# Patient Record
Sex: Male | Born: 1965 | Race: White | Hispanic: No | Marital: Married | Smoking: Former smoker
Health system: Southern US, Community
[De-identification: ages and names within clinical notes are randomized; demographics above are authoritative.]

## PROBLEM LIST (undated history)

## (undated) DIAGNOSIS — T7840XA Allergy, unspecified, initial encounter: Secondary | ICD-10-CM

## (undated) DIAGNOSIS — I472 Ventricular tachycardia, unspecified: Secondary | ICD-10-CM

## (undated) DIAGNOSIS — I219 Acute myocardial infarction, unspecified: Secondary | ICD-10-CM

## (undated) DIAGNOSIS — L309 Dermatitis, unspecified: Secondary | ICD-10-CM

## (undated) DIAGNOSIS — Z87442 Personal history of urinary calculi: Secondary | ICD-10-CM

## (undated) DIAGNOSIS — F1910 Other psychoactive substance abuse, uncomplicated: Secondary | ICD-10-CM

## (undated) DIAGNOSIS — Z95810 Presence of automatic (implantable) cardiac defibrillator: Secondary | ICD-10-CM

## (undated) DIAGNOSIS — I2699 Other pulmonary embolism without acute cor pulmonale: Secondary | ICD-10-CM

## (undated) DIAGNOSIS — G459 Transient cerebral ischemic attack, unspecified: Secondary | ICD-10-CM

## (undated) DIAGNOSIS — I509 Heart failure, unspecified: Secondary | ICD-10-CM

## (undated) DIAGNOSIS — I4891 Unspecified atrial fibrillation: Secondary | ICD-10-CM

## (undated) DIAGNOSIS — E785 Hyperlipidemia, unspecified: Secondary | ICD-10-CM

## (undated) DIAGNOSIS — N200 Calculus of kidney: Secondary | ICD-10-CM

## (undated) DIAGNOSIS — K219 Gastro-esophageal reflux disease without esophagitis: Secondary | ICD-10-CM

## (undated) DIAGNOSIS — J189 Pneumonia, unspecified organism: Secondary | ICD-10-CM

## (undated) DIAGNOSIS — E119 Type 2 diabetes mellitus without complications: Secondary | ICD-10-CM

## (undated) DIAGNOSIS — I10 Essential (primary) hypertension: Secondary | ICD-10-CM

## (undated) DIAGNOSIS — M1990 Unspecified osteoarthritis, unspecified site: Secondary | ICD-10-CM

## (undated) HISTORY — DX: Heart failure, unspecified: I50.9

## (undated) HISTORY — PX: SHOULDER ARTHROSCOPY: SHX128

## (undated) HISTORY — DX: Other pulmonary embolism without acute cor pulmonale: I26.99

## (undated) HISTORY — DX: Calculus of kidney: N20.0

## (undated) HISTORY — DX: Hyperlipidemia, unspecified: E78.5

## (undated) HISTORY — DX: Type 2 diabetes mellitus without complications: E11.9

## (undated) HISTORY — PX: VASECTOMY: SHX75

## (undated) HISTORY — PX: OTHER SURGICAL HISTORY: SHX169

## (undated) HISTORY — DX: Unspecified osteoarthritis, unspecified site: M19.90

## (undated) HISTORY — DX: Other psychoactive substance abuse, uncomplicated: F19.10

## (undated) HISTORY — DX: Dermatitis, unspecified: L30.9

## (undated) HISTORY — DX: Allergy, unspecified, initial encounter: T78.40XA

## (undated) HISTORY — PX: CERVICAL FUSION: SHX112

## (undated) HISTORY — PX: WISDOM TOOTH EXTRACTION: SHX21

---

## 2007-04-25 ENCOUNTER — Encounter: Admission: RE | Admit: 2007-04-25 | Discharge: 2007-04-25 | Payer: Self-pay | Admitting: Family Medicine

## 2007-04-25 IMAGING — CR DG CHEST 2V
2 series · 2 of 2 positions shown · non-contrast
Comparison: none

CLINICAL DATA: Short of breath, fever.  Smoking history.
 TWO VIEW CHEST:
 Two views of the chest show no pneumonia.  There are prominent perihilar markings with peribronchial thickening consistent with bronchitis.  The heart is mildly enlarged.  No bony abnormality is seen.

[w chest pa]
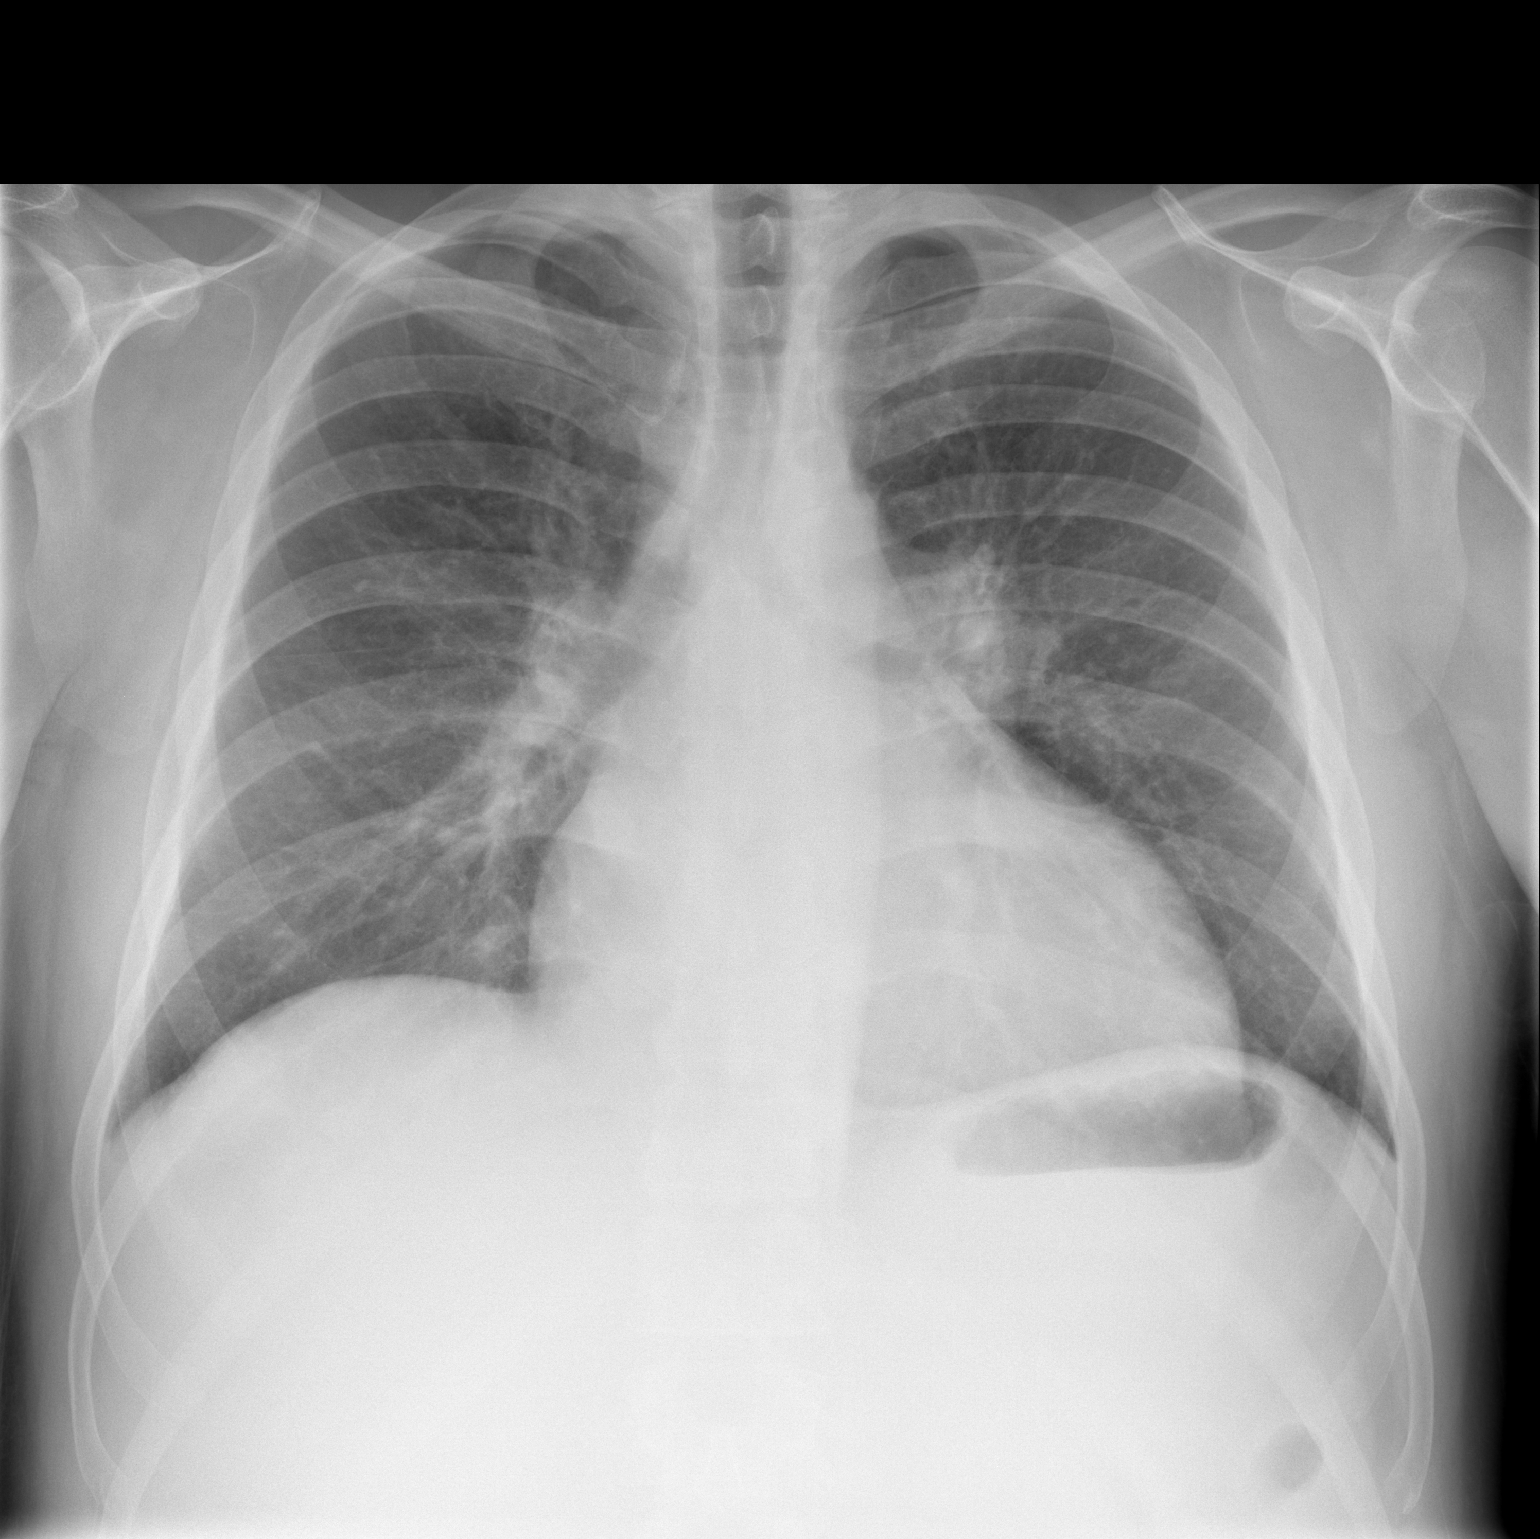

[w chest lat]
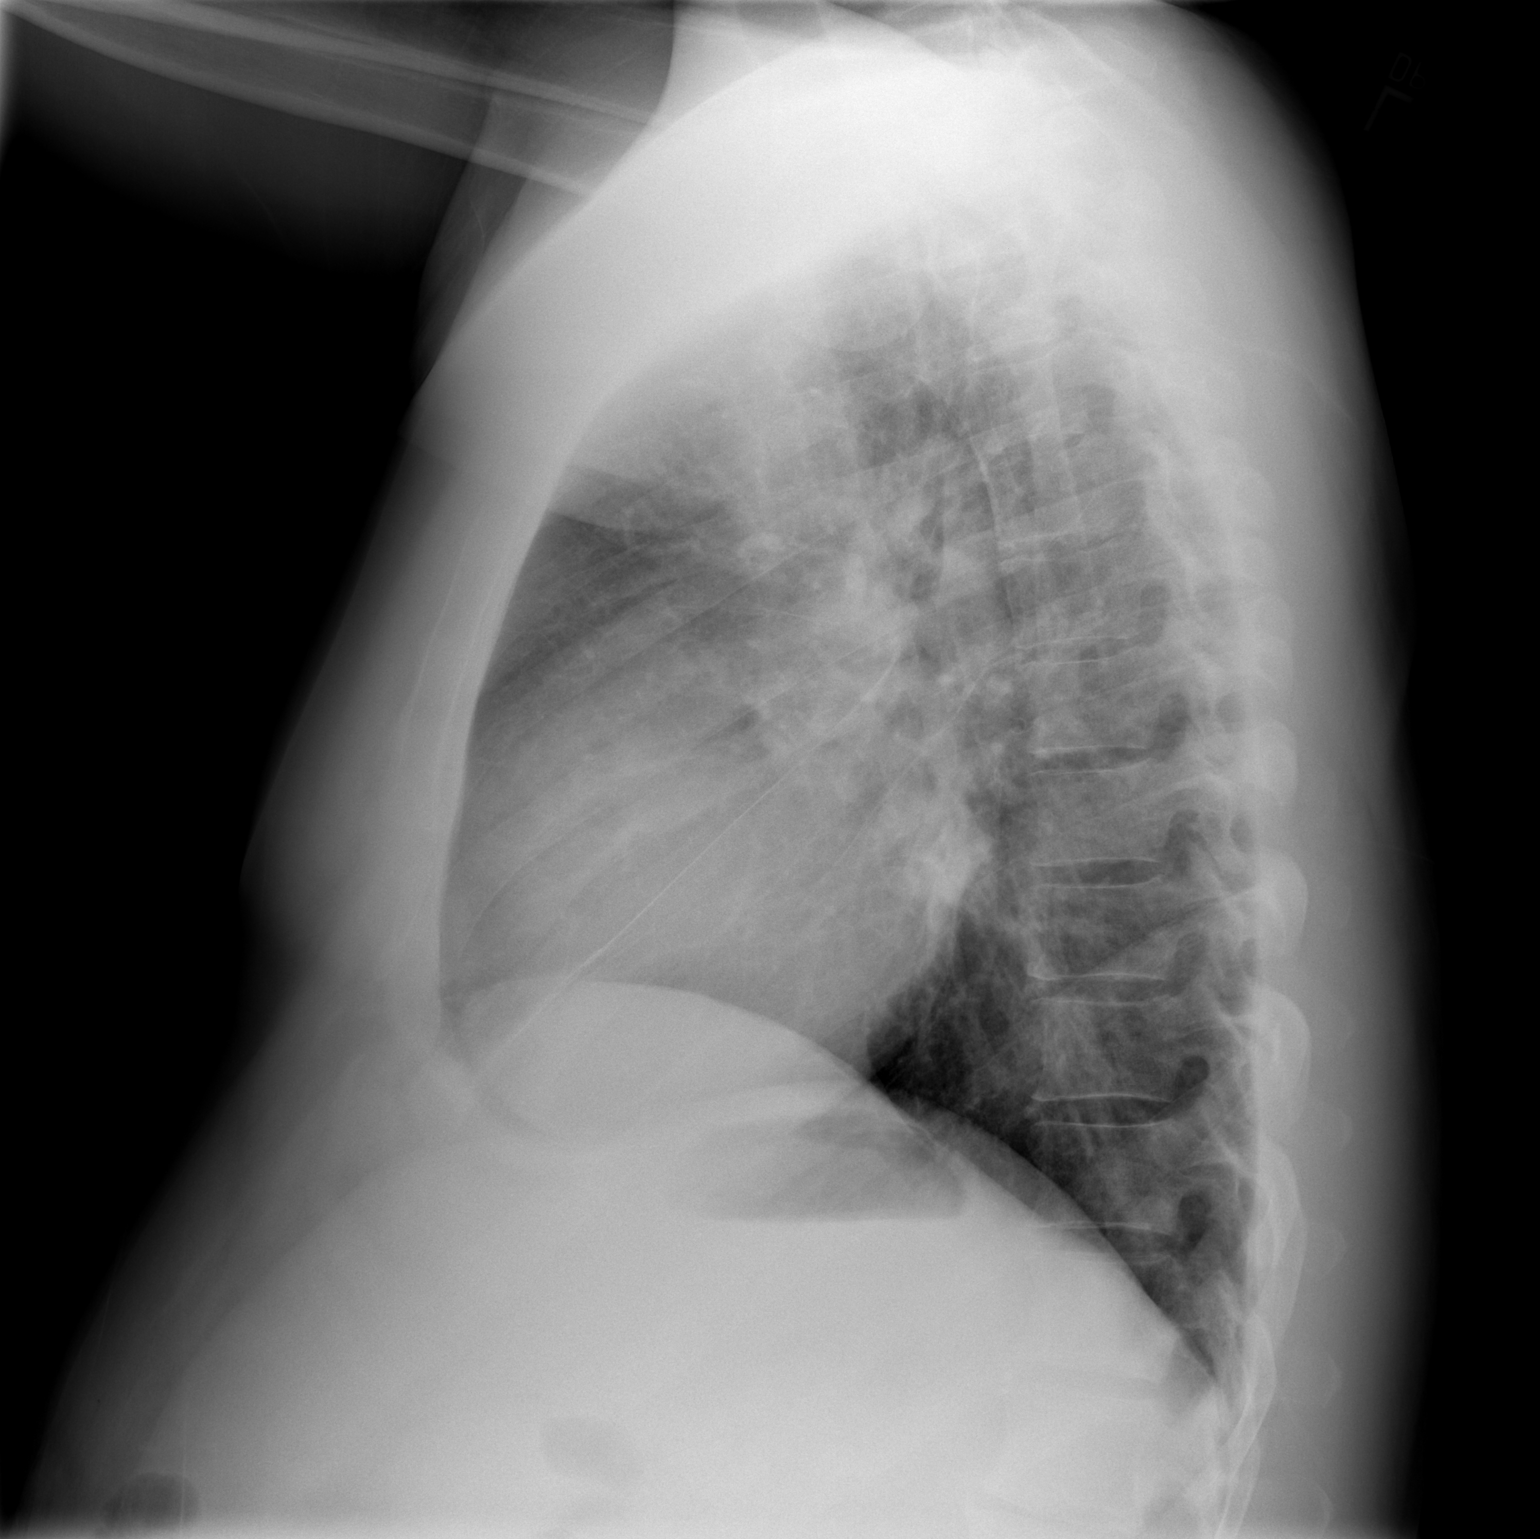

[2 of 2 positions shown; findings below may reference images not displayed]

IMPRESSION: Peribronchial thickening consistent with bronchitis.  No pneumonia.

## 2007-04-26 ENCOUNTER — Ambulatory Visit: Payer: Self-pay | Admitting: Cardiology

## 2007-04-26 ENCOUNTER — Ambulatory Visit: Payer: Self-pay | Admitting: Internal Medicine

## 2007-04-26 ENCOUNTER — Inpatient Hospital Stay (HOSPITAL_COMMUNITY): Admission: EM | Admit: 2007-04-26 | Discharge: 2007-05-06 | Payer: Self-pay | Admitting: Emergency Medicine

## 2007-04-26 IMAGING — CR DG CHEST 2V
2 series · 2 of 2 positions shown · non-contrast
Comparison: [DATE].

CLINICAL DATA: Chest pain.  Short of breath.
 CHEST ? 2 VIEW:

[w chest pa]
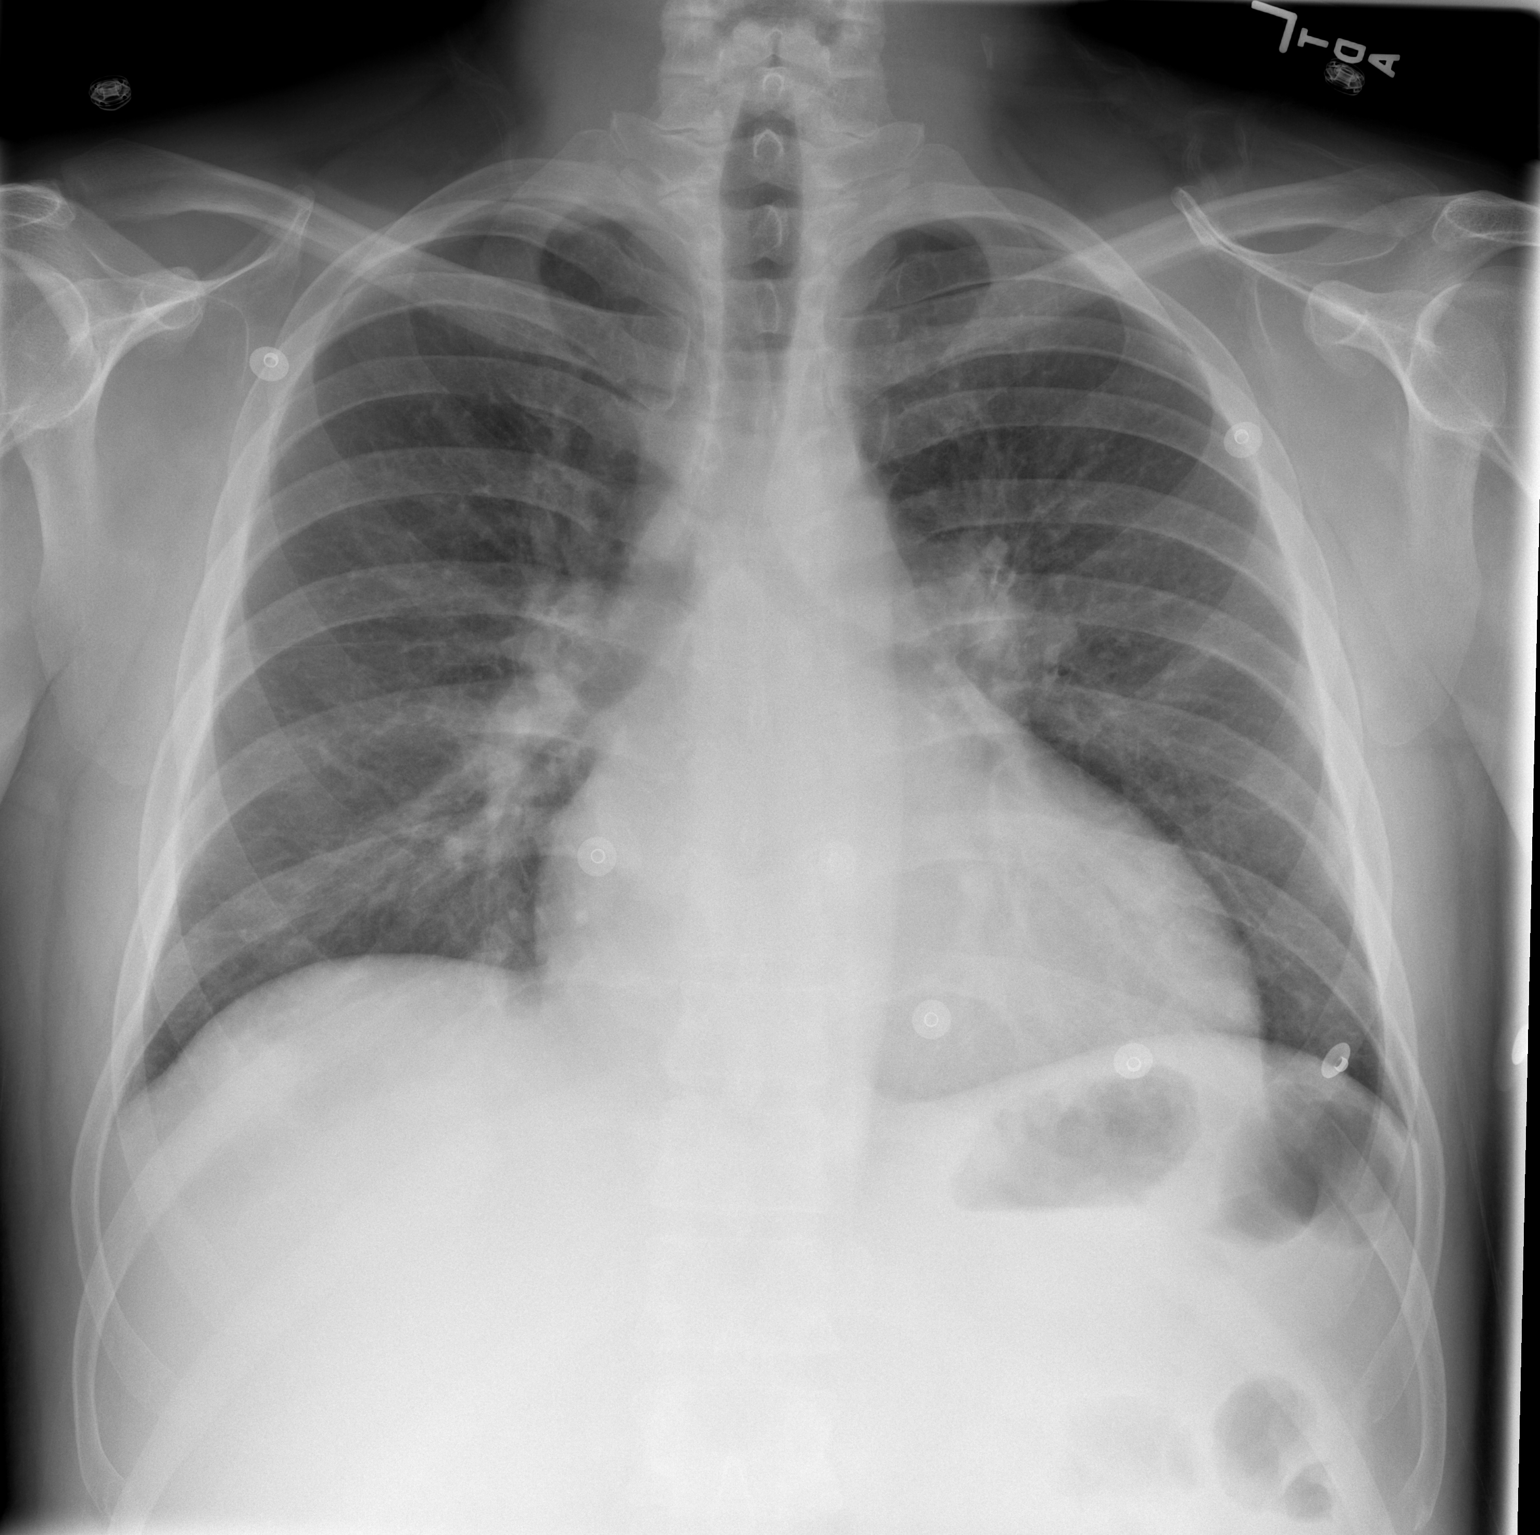

[w chest lat]
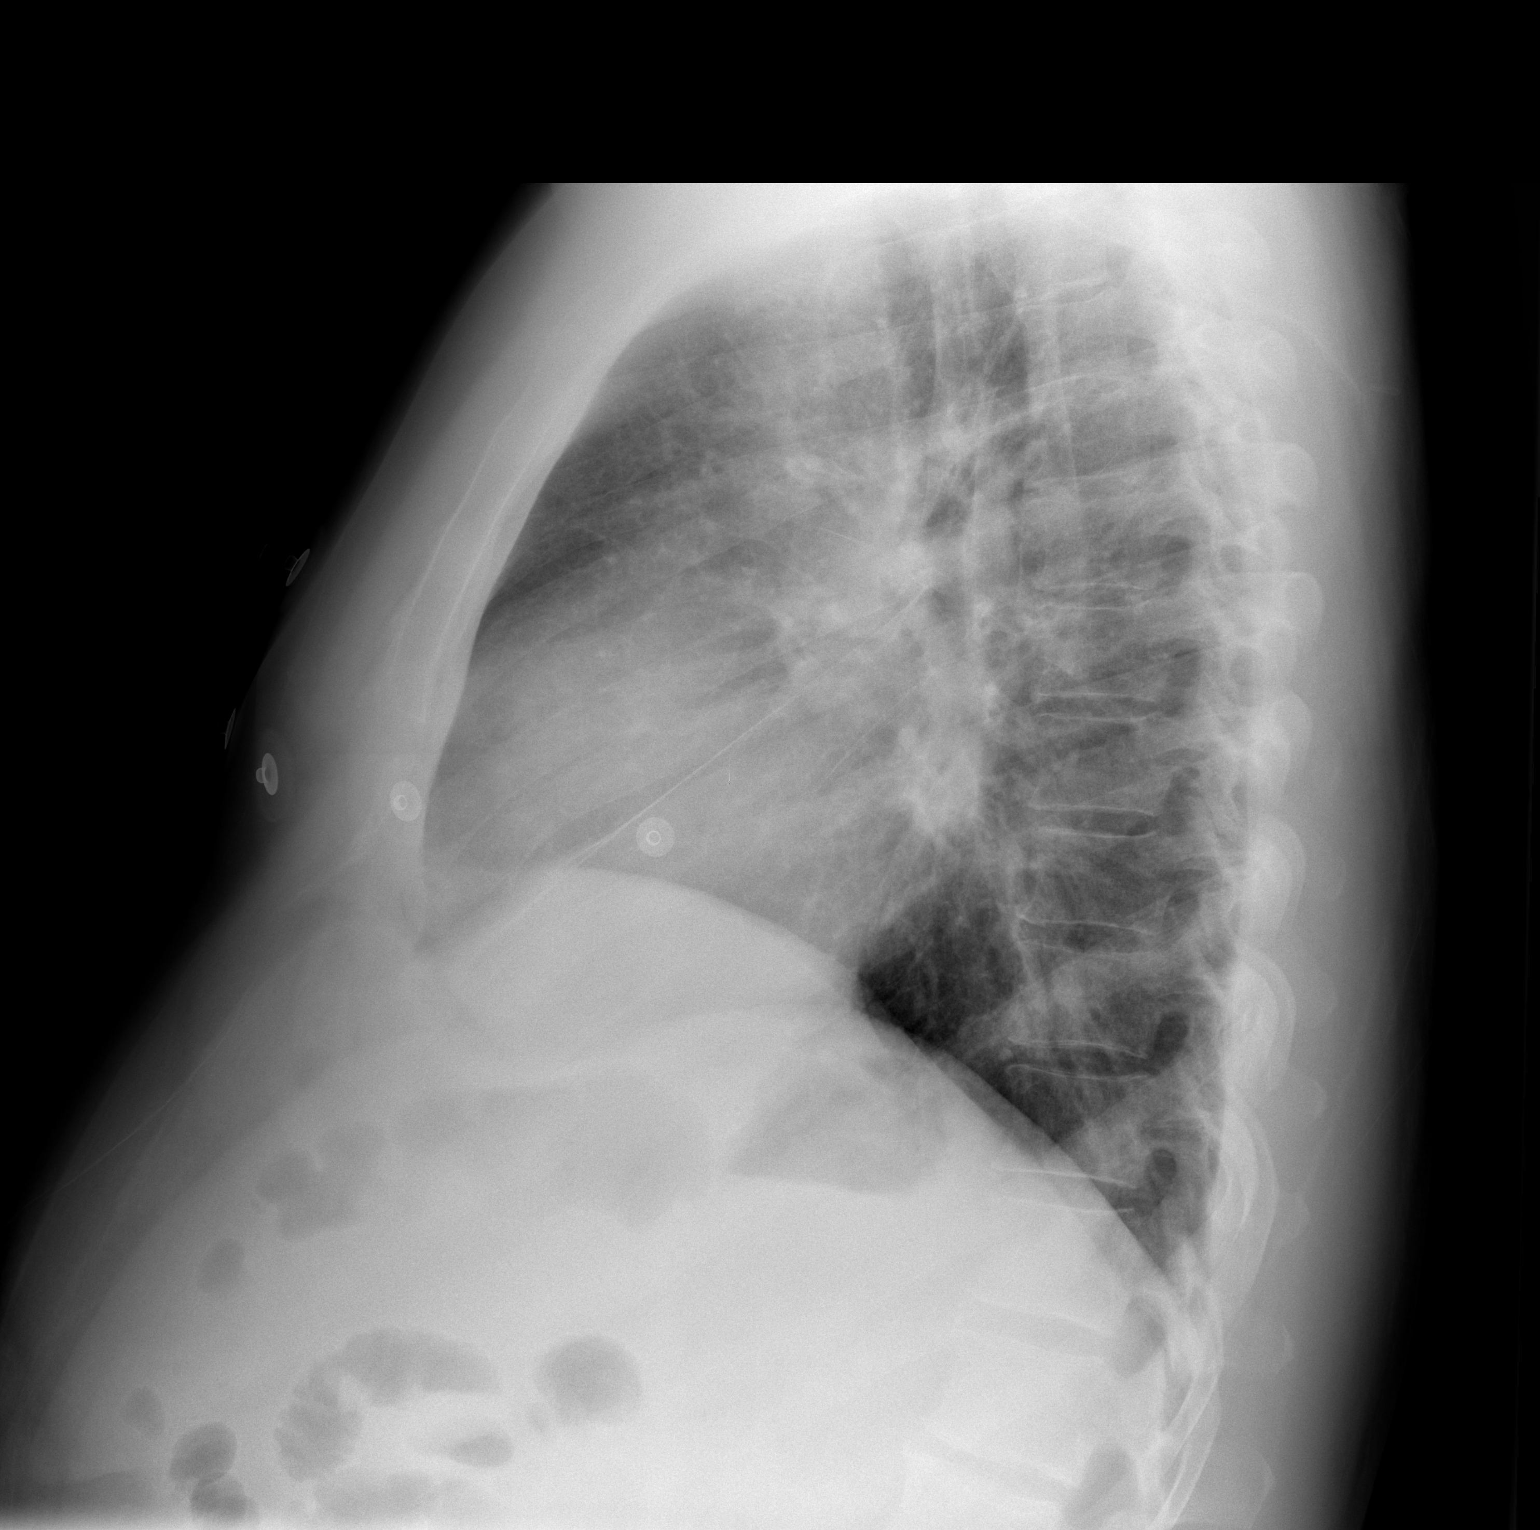

[2 of 2 positions shown; findings below may reference images not displayed]

FINDINGS: Two views of the chest show changes of bronchitis. However, on the frontal view, there is vague opacity at the right lung base, and patchy pneumonia cannot be excluded.  There is cardiomegaly present.  No effusion is seen.  No bony abnormality is noted.
IMPRESSION: 1. Changes of bronchitis.  Cannot exclude patchy pneumonia at the right lung base seen only on the frontal view.  
 2. Stable cardiomegaly.

## 2007-04-27 ENCOUNTER — Encounter (INDEPENDENT_AMBULATORY_CARE_PROVIDER_SITE_OTHER): Payer: Self-pay | Admitting: Interventional Radiology

## 2007-04-27 IMAGING — US US PARACENTESIS
1 series · 5 of 5 positions shown · non-contrast
Comparison: none

CLINICAL DATA: Abdominal distention. Ascites.

[Series 1: unknown · 0.38mm/px · 5 of 5 slices shown]
[im 1/5]
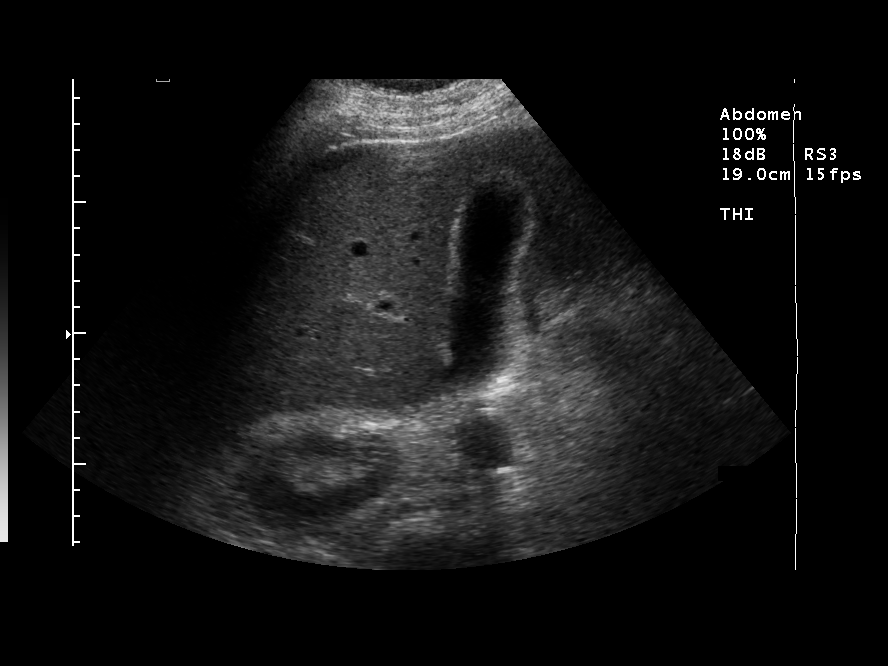
[im 2/5]
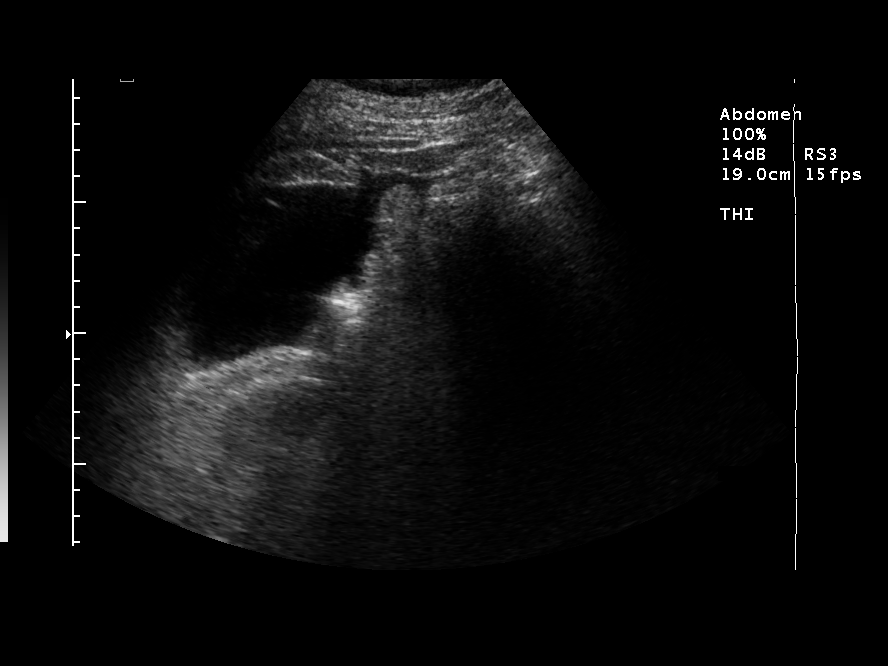
[im 3/5]
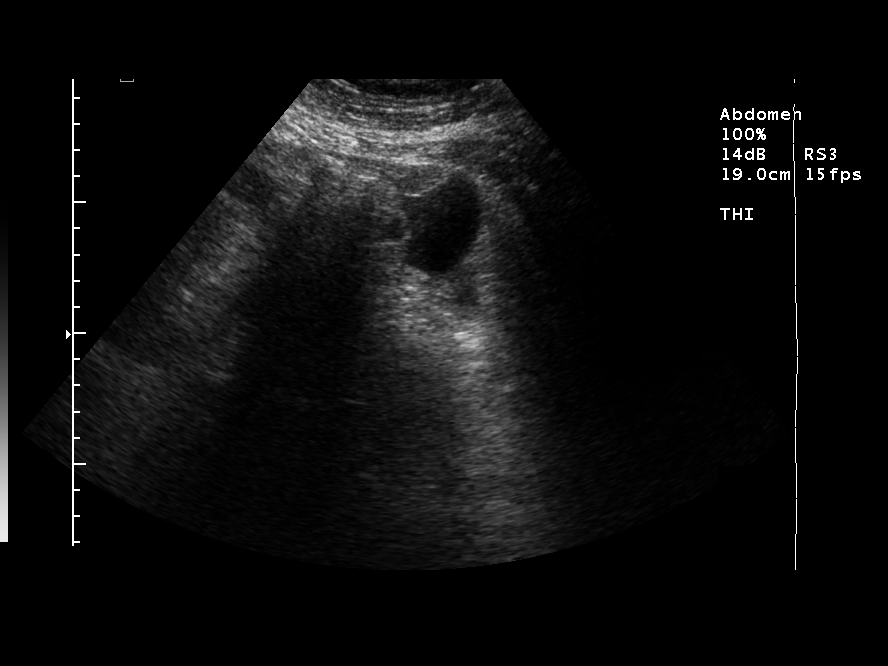
[im 4/5]
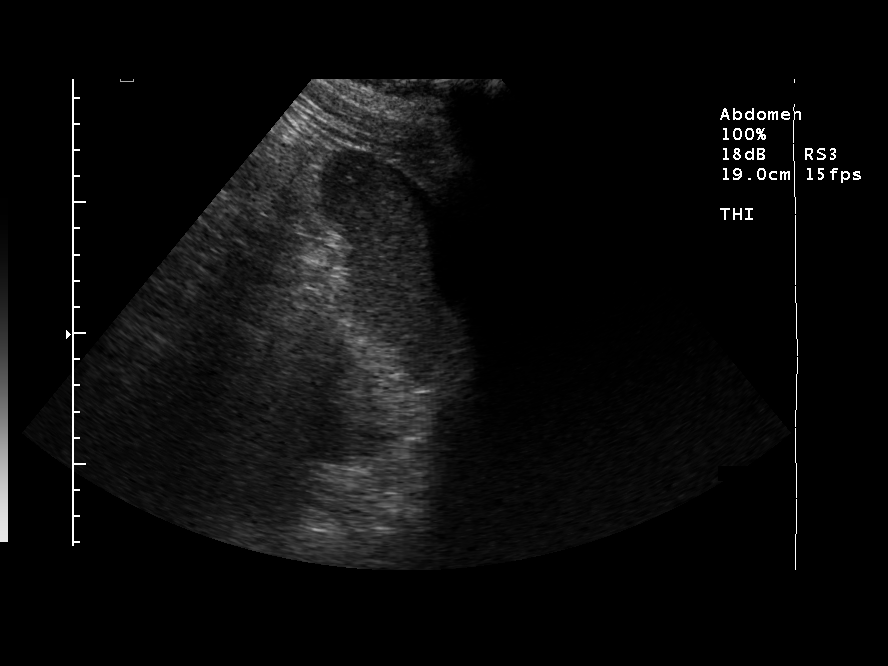
[im 5/5]
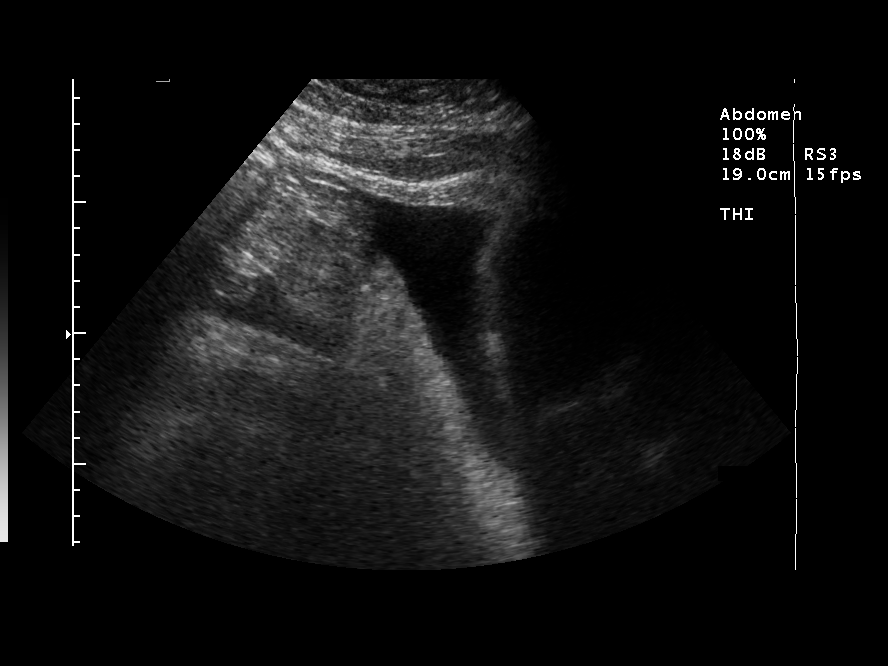

[5 of 5 positions shown; findings below may reference images not displayed]

ULTRASOUND GUIDED PARACENTESIS:

Survey ultrasound of the abdomen was performed and an appropriate skin entry
site in the right  lower abdomen was selected. Skin site was marked, prepped
with Betadine, and draped in usual sterile fashion, and infiltrated locally with
1% lidocaine. A 5 French multisidehole ARBEN KLEO needle was advanced into the
peritoneal space until fluid could be aspirated. The sheath was advanced and the
needle removed. 600 ml of clear bright yellow ascites were aspirated. Samples
were sent for the requested laboratory studies. No immediate complication.
IMPRESSION: 1. Technically successful ultrasound guided paracentesis, removing 600 mL 
ascites

## 2007-04-27 IMAGING — CR DG ABDOMEN 1V
1 series · 1 of 1 positions shown · non-contrast
Comparison: none

CLINICAL DATA: Nephrolithiasis

Abdomen one view:
Comparison CT from [DATE]. 7 mm calculus projects at tip of the right L2
transverse process, stable position compared to previous exam. Normal bowel gas
pattern. Visualized bones unremarkable.

[t abdomen supine]
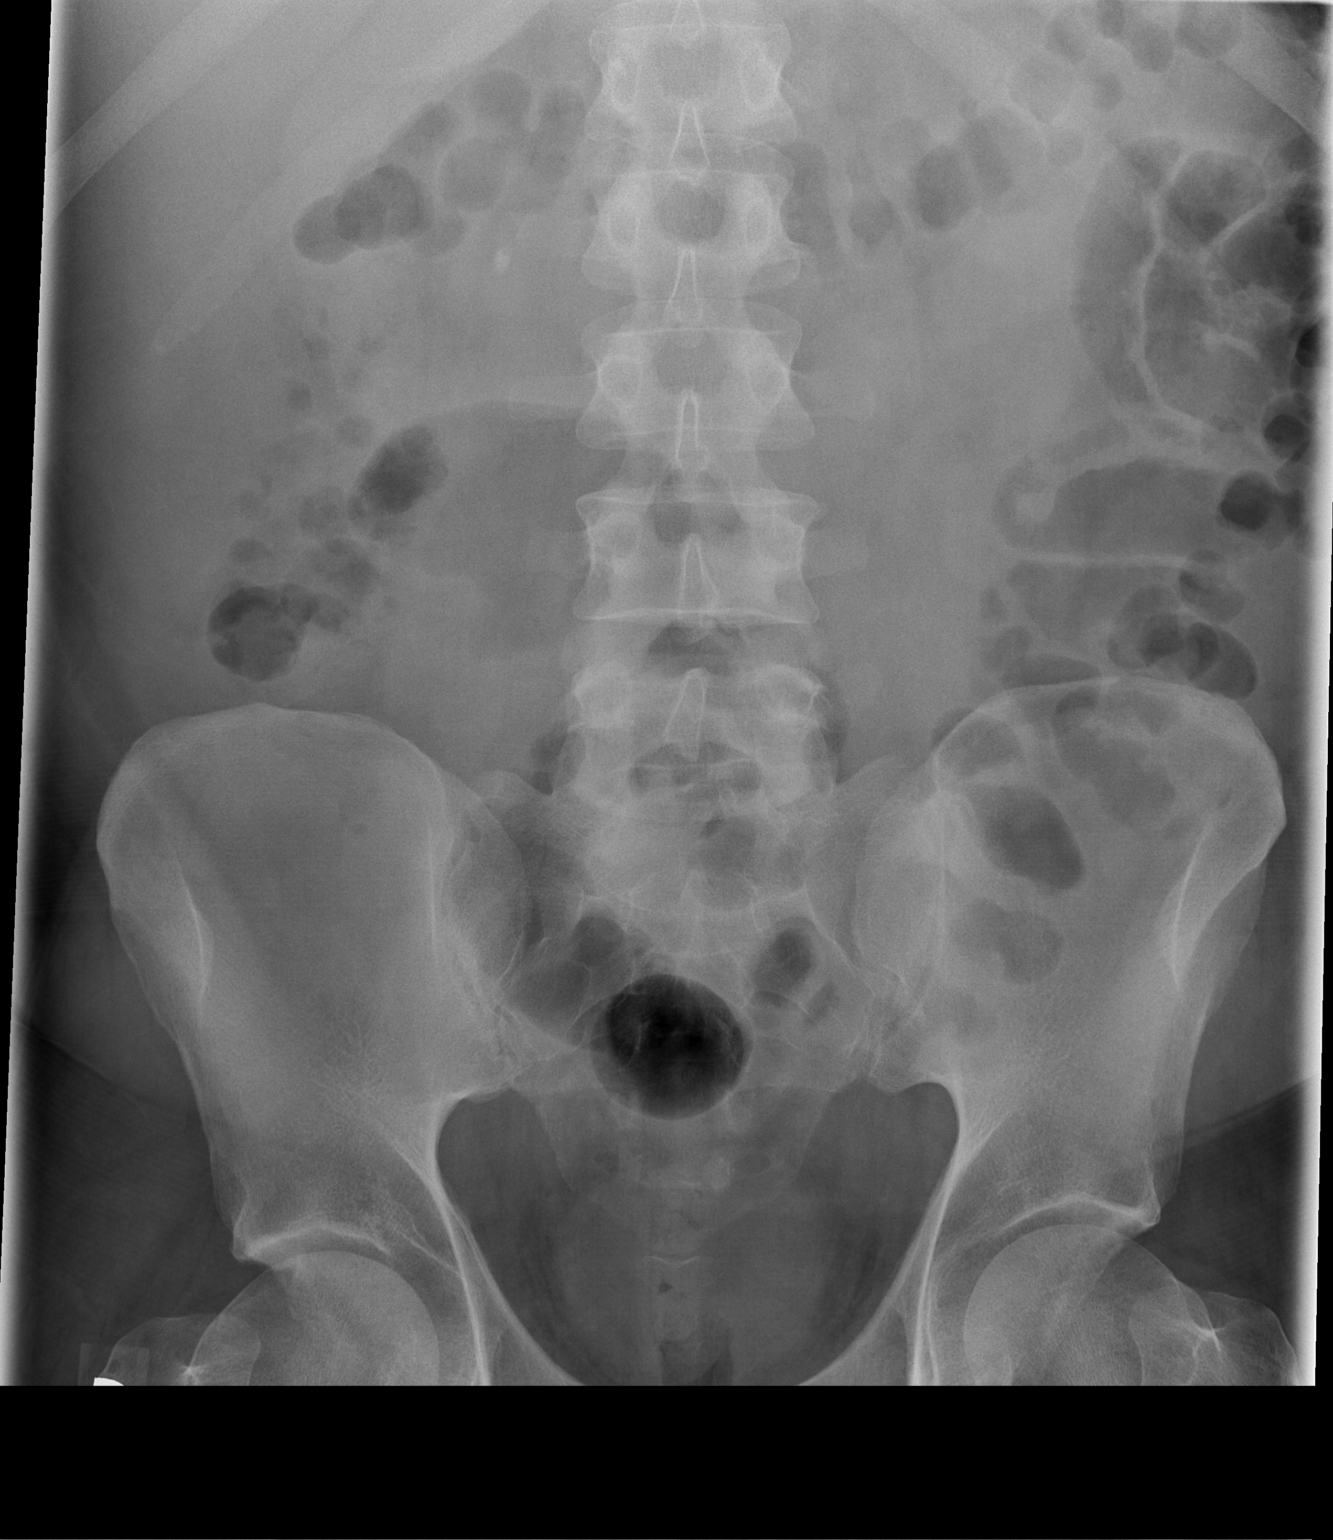

[1 of 1 positions shown; findings below may reference images not displayed]

IMPRESSION: 1. Stable position of proximal right ureteral calculus.

## 2007-04-28 IMAGING — US US ART/VEN ABD/PELV/SCROTUM DOPPLER COMPLETE
1 series · 14 of 25 positions shown · non-contrast
Comparison: CT [DATE] without contrast.

CLINICAL DATA: 40 year-old male, abdominal ascites. History of alcohol use. Evaluate for portal hypertension vs thrombosis.
ABDOMINAL/LIVER DOPPLER ULTRASOUND:
TECHNIQUE: Color and duplex Doppler ultrasound evaluation of the hepatic inflow and outflow vessels was performed.

[Series 1: unknown · 0.38mm/px · 14 of 43 slices shown]
[im 1/43]
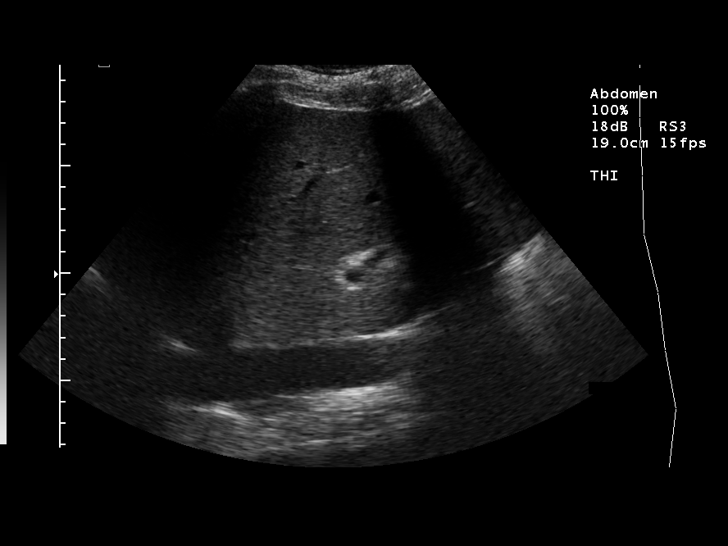
[im 4/43]
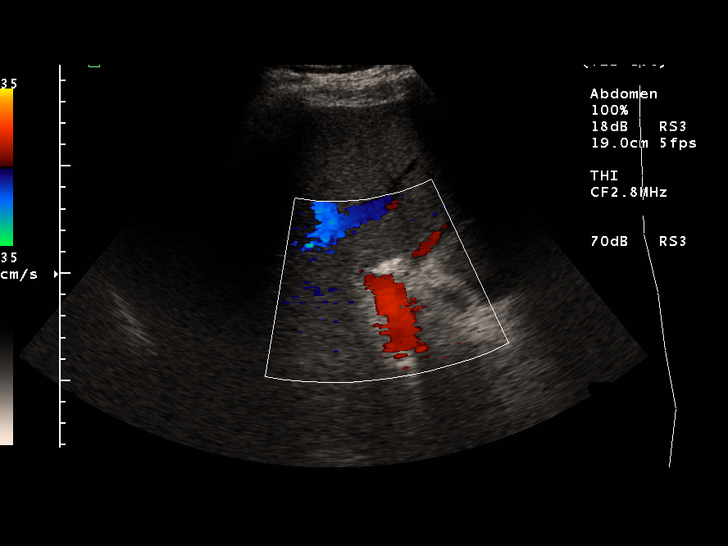
[im 8/43]
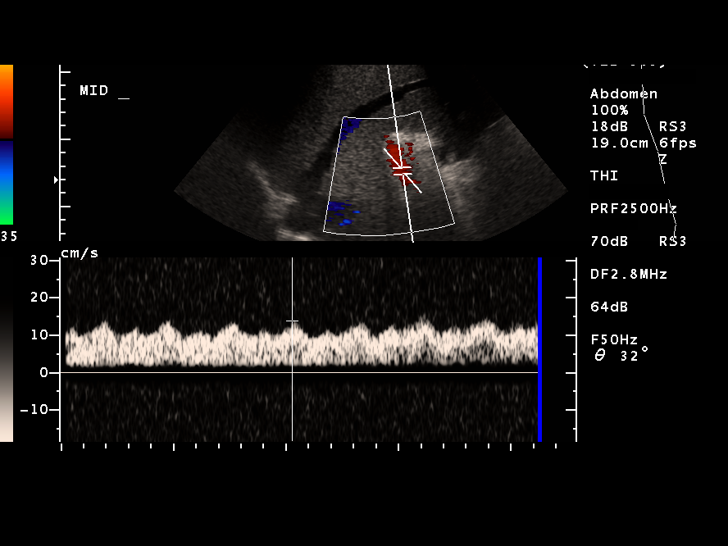
[im 11/43]
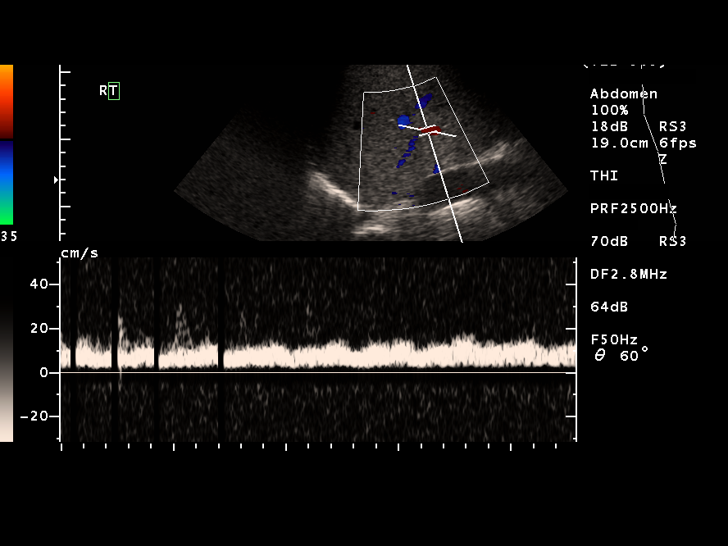
[im 15/43]
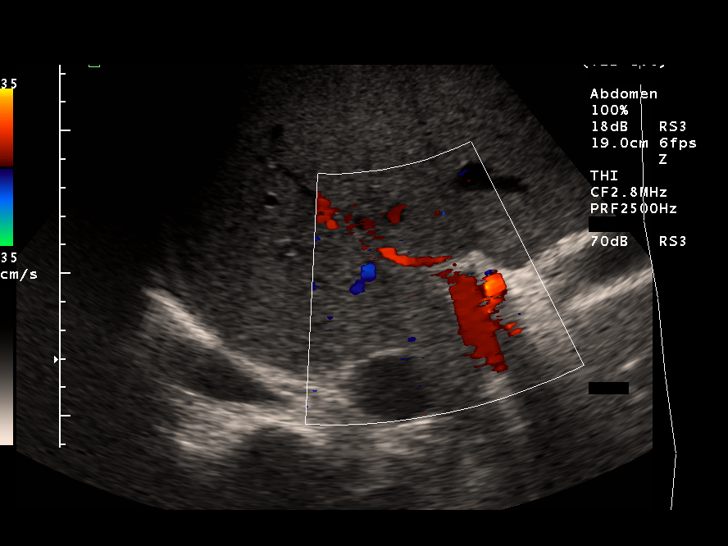
[im 16/43]
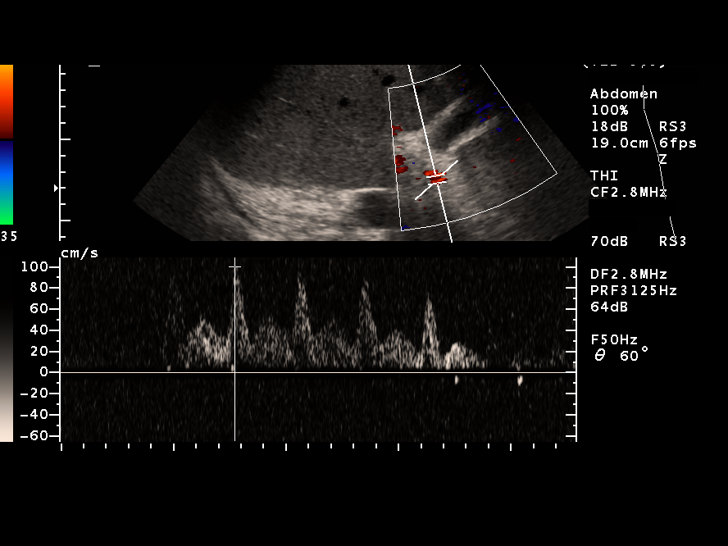
[im 20/43]
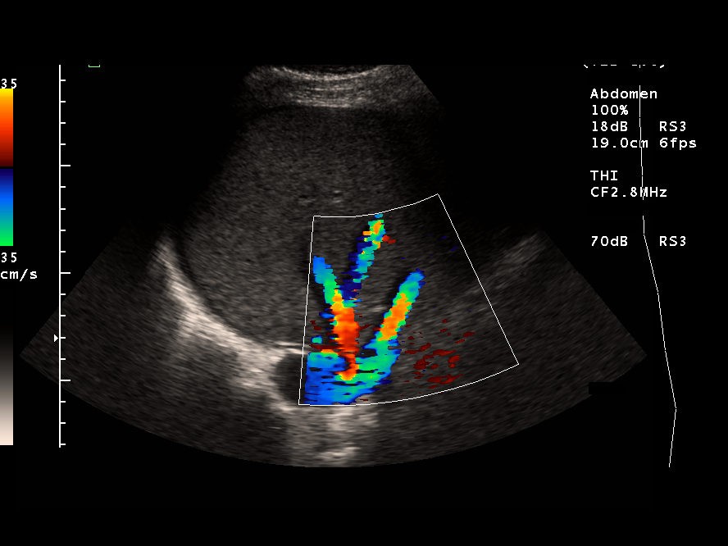
[im 23/43]
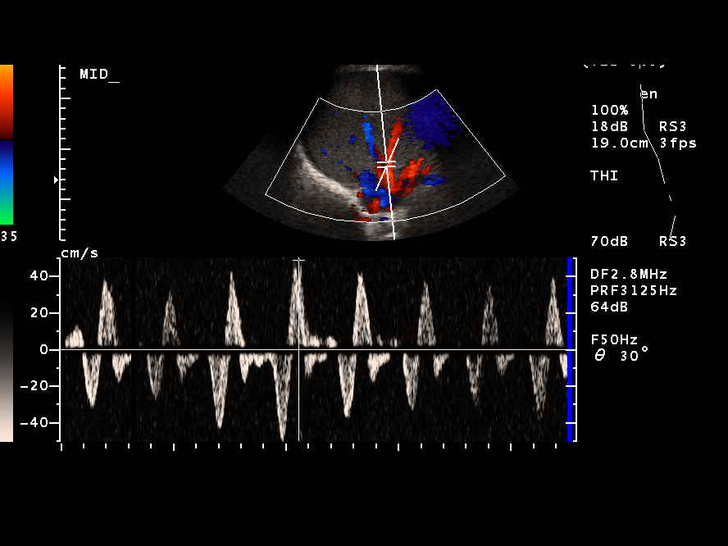
[im 27/43]
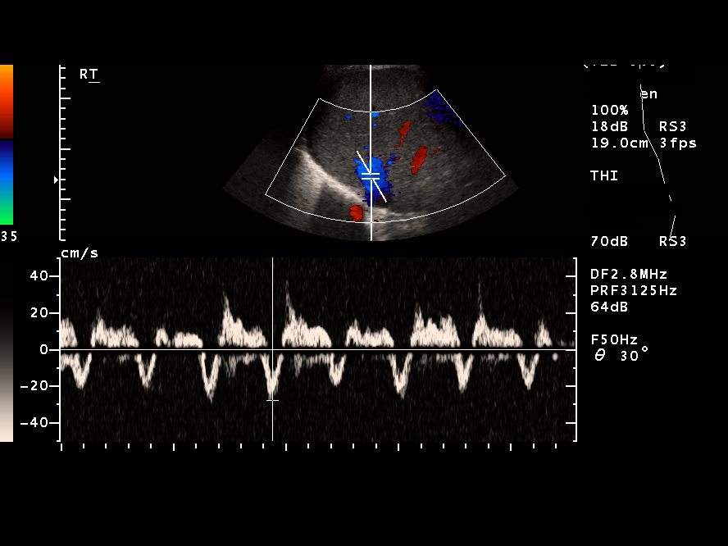
[im 29/43]
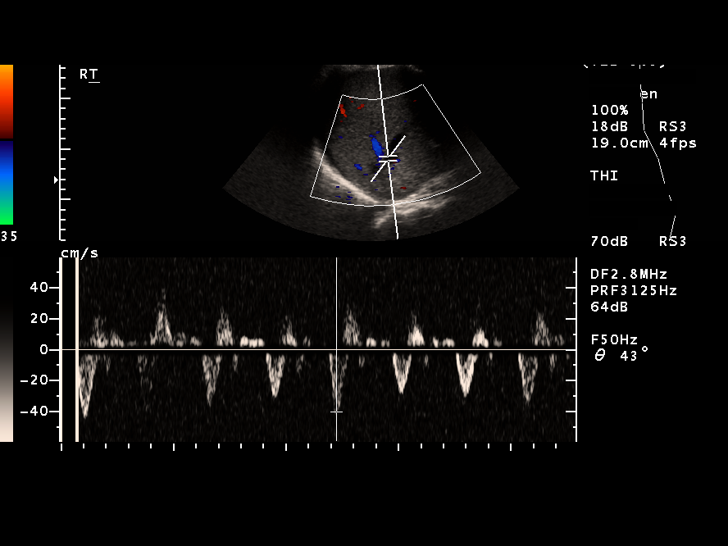
[im 32/43]
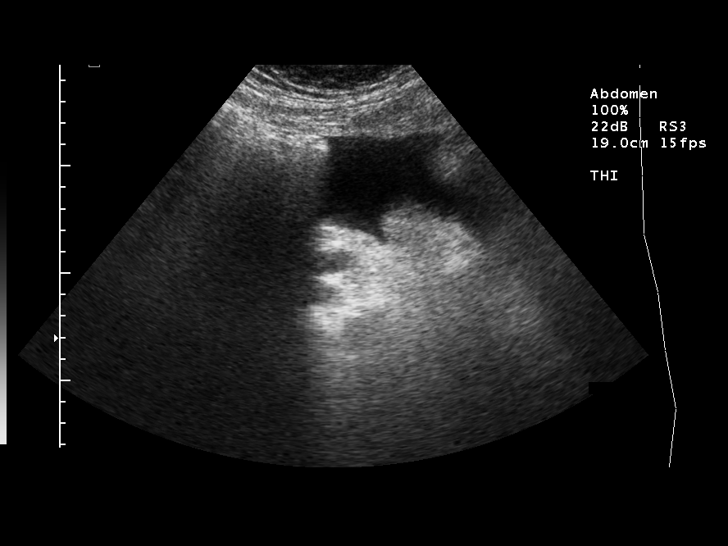
[im 36/43]
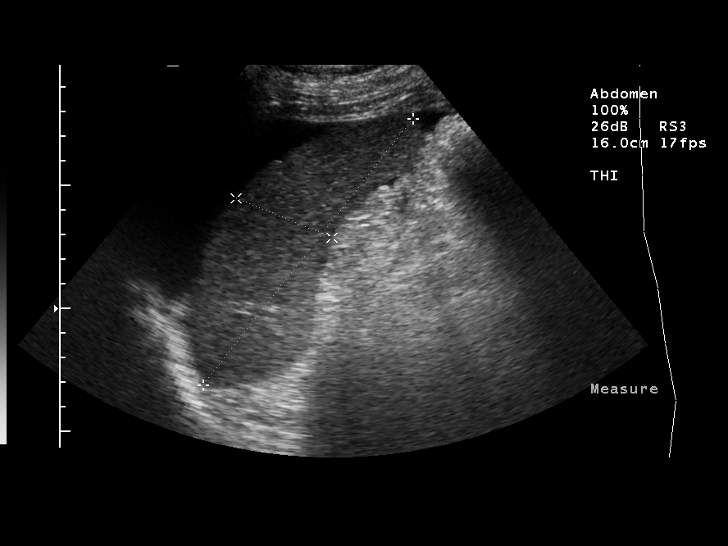
[im 39/43]
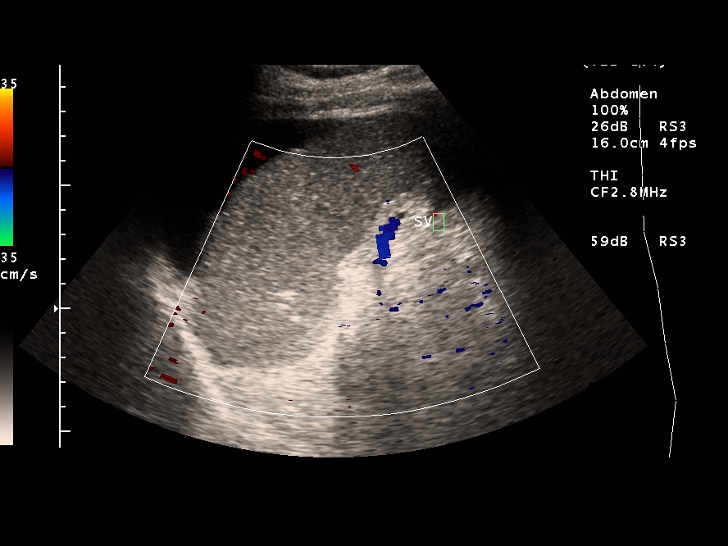
[im 43/43]
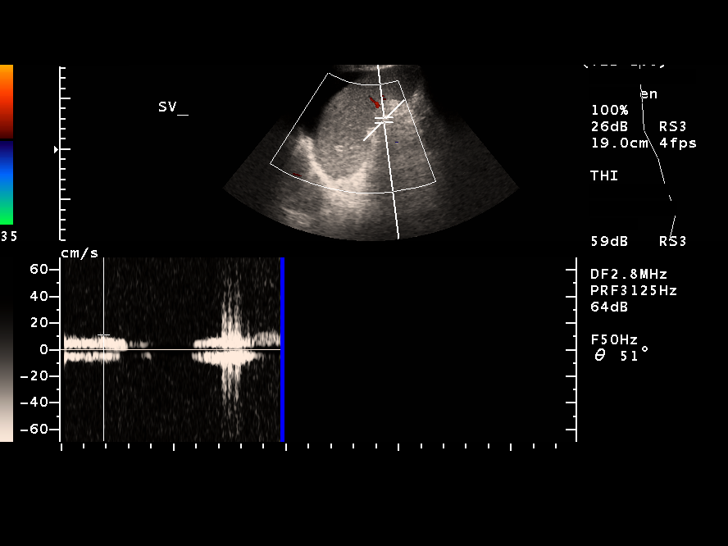

[14 of 25 positions shown; findings below may reference images not displayed]

FINDINGS: Portal vein is patent with normal hepatopetal flow.  Within the liver, the right and left portal veins are patent with normal hepatopetal flow.  No evidence of portal vein occlusion or thrombus.  The right, middle, and left hepatic veins are also all patent with hepatofugal flow into the heart. No evidence of hepatic venoocclusive disease or thrombus.  Intrahepatic IVC is patent. Splenis vein is patent without occlusion or thrombus and normal hepatopetal flow. Spleen measures 13.7 cm in length. A small amount of upper abdominal ascites is noted.
IMPRESSION: 1.  Patent portal, hepatic and splenic veins.  
2.  No evidence of portal venous occlusion, thrombus or hypertension by ultrasound.
3.  Small amount of upper abdominal ascites.

## 2007-04-29 IMAGING — CR DG CHEST 1V PORT
1 series · 1 of 1 positions shown · non-contrast
Comparison: [DATE].

CLINICAL DATA: Increasing shortness of breath.  Leukocytosis.  
 PORTABLE CHEST - 1 VIEW:

[view not recorded]
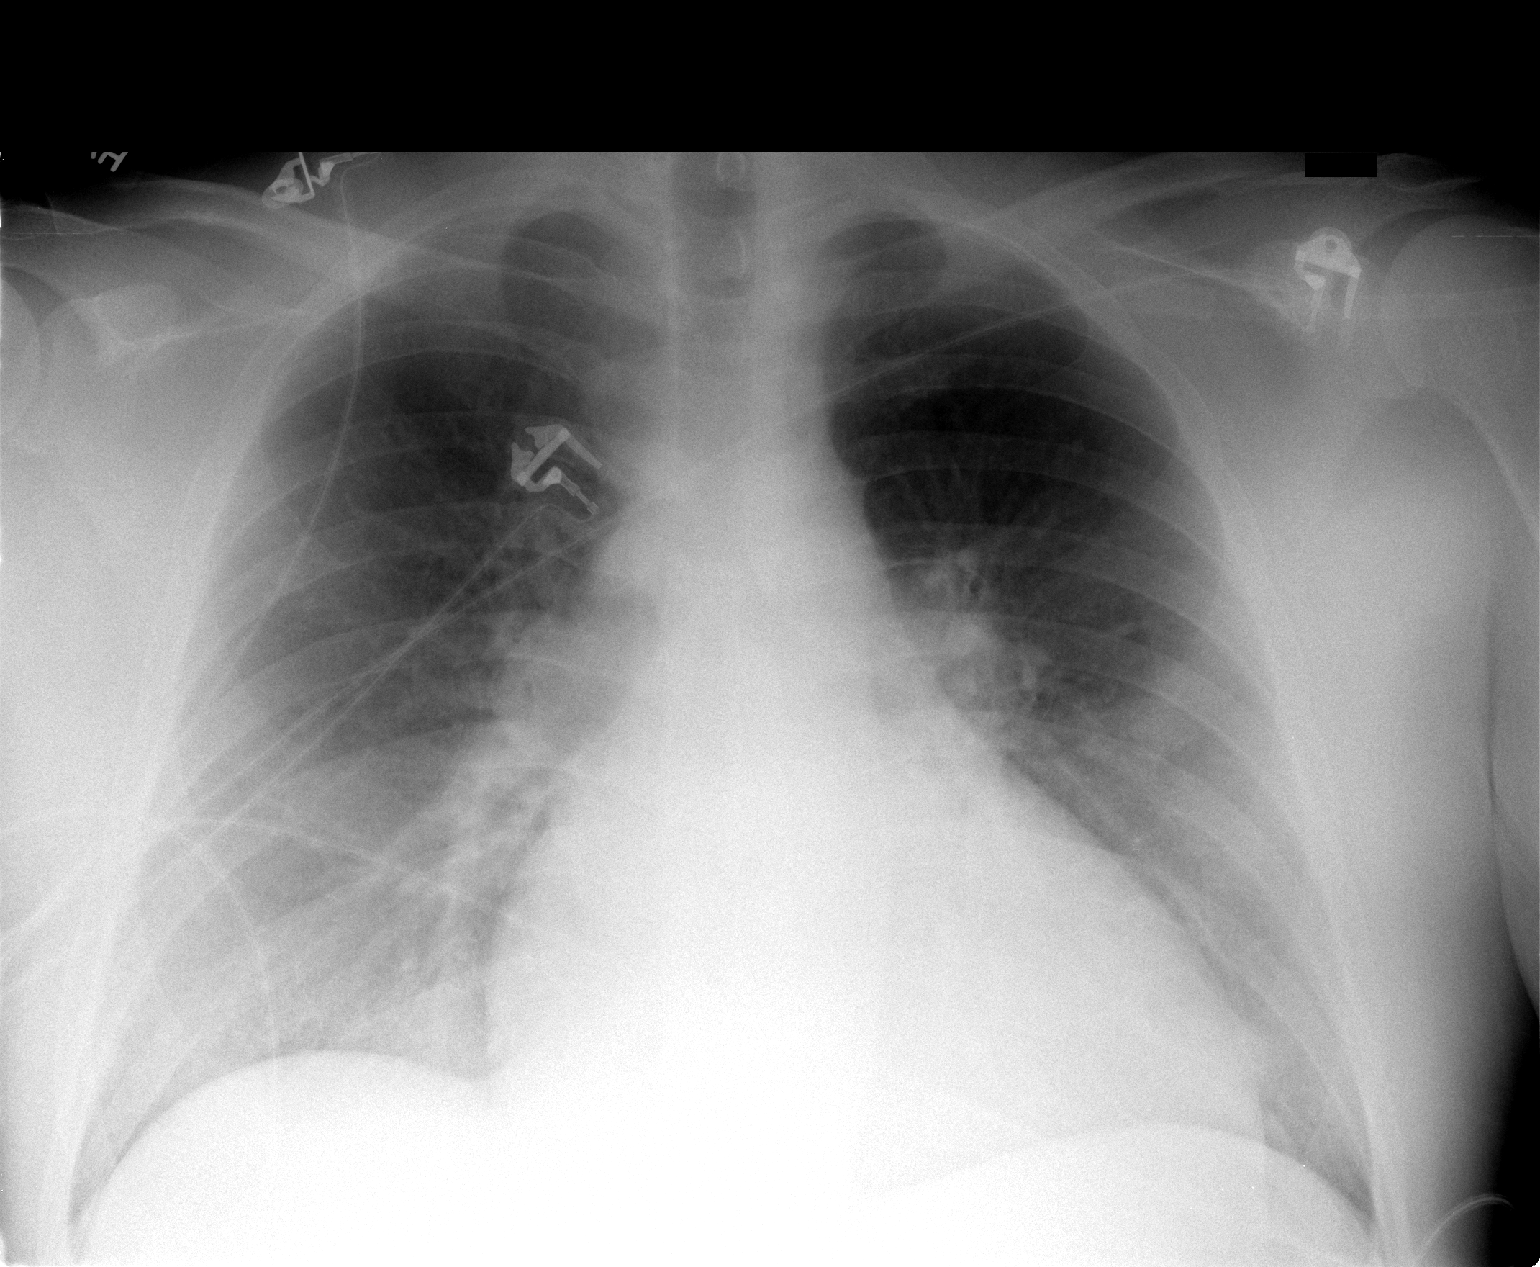

[1 of 1 positions shown; findings below may reference images not displayed]

FINDINGS: There is persistent cardiomegaly.  There is slight distention of the azygous vein, but the pulmonary vascularity does not appear increased.  There is persistent peribronchial thickening.  Apparent increased density at the bases is probably overlying soft tissues.  I do not see discrete consolidative infiltrates or effusions.
IMPRESSION: Persistent cardiomegaly with persistent peribronchial thickening and new slight distention of the azygous vein which can be an early sign of mild elevated right heart pressure.

## 2007-05-01 IMAGING — CR DG CHEST 1V PORT
1 series · 1 of 1 positions shown · non-contrast
Comparison: [DATE].

CLINICAL DATA: Leukocytosis.  
 PORTABLE CHEST - 1 VIEW [DATE] AT [4I] HOURS:

[view not recorded]
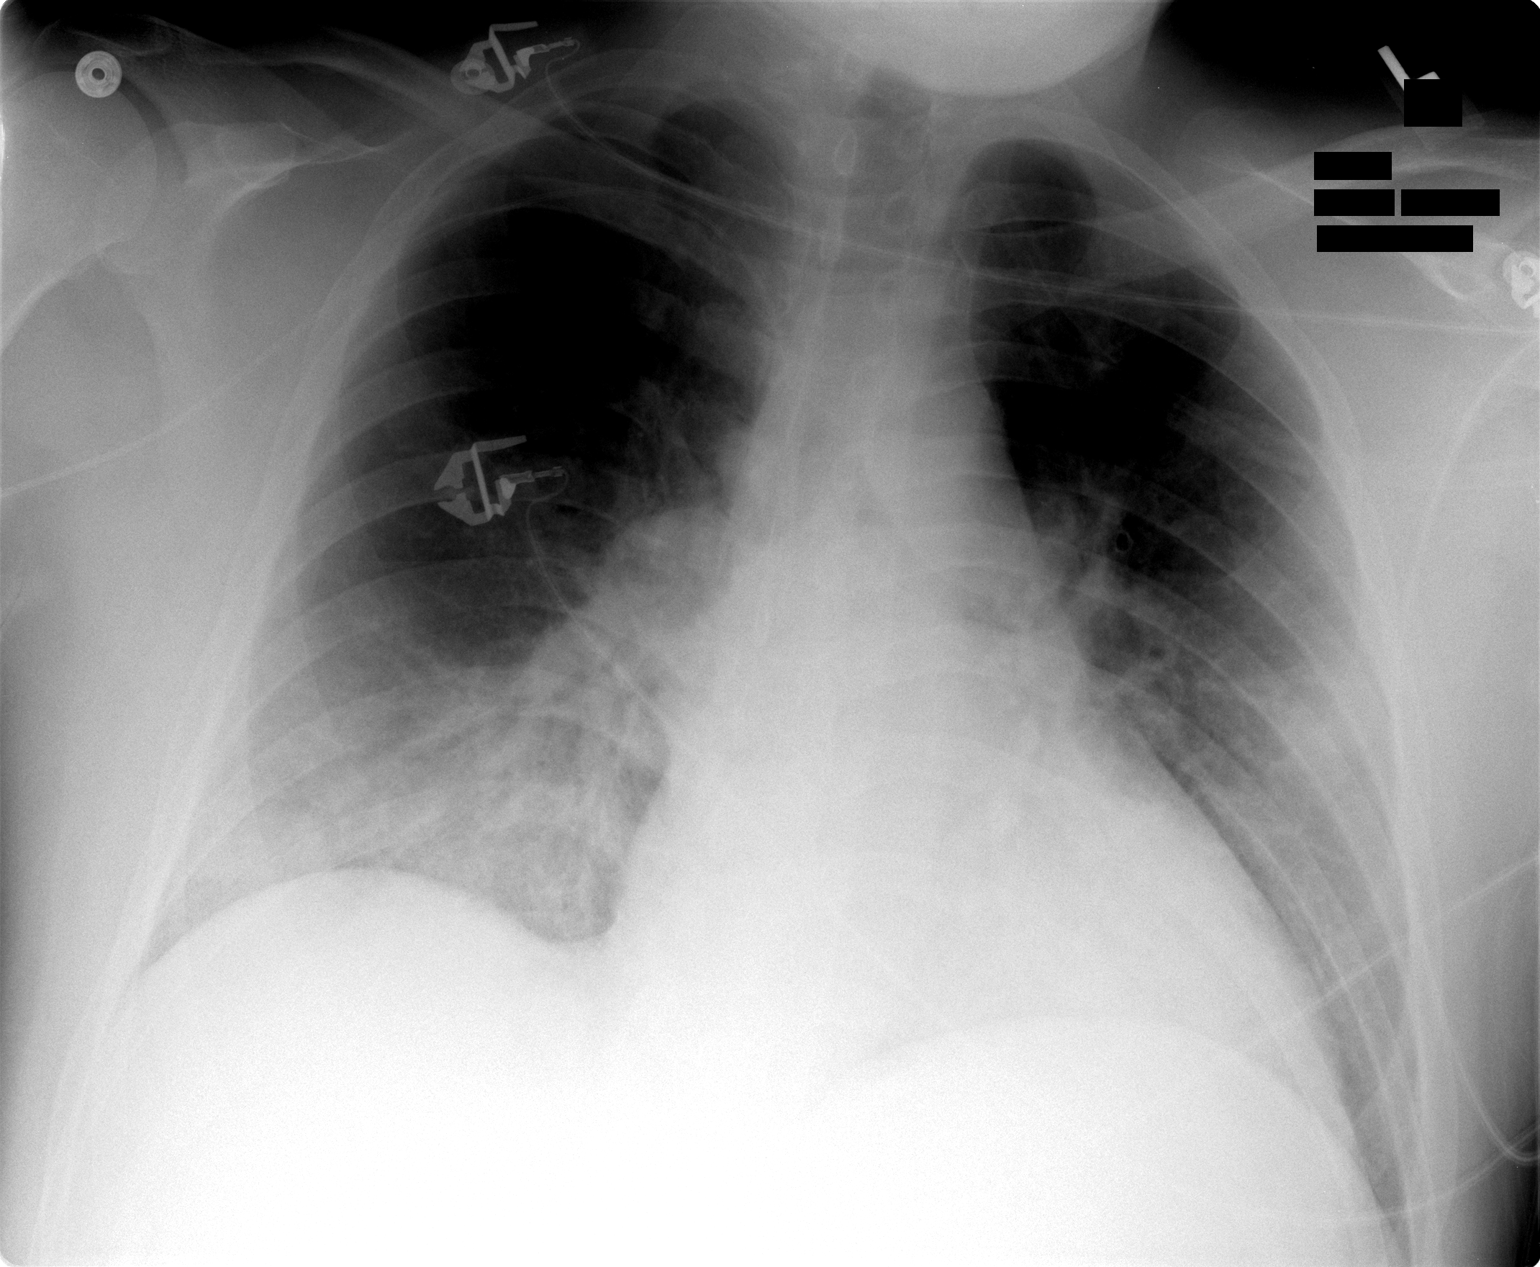

[1 of 1 positions shown; findings below may reference images not displayed]

FINDINGS: The PICC is stable.  Left perihilar and right lower lobe airspace disease has worsened.  No pneumothorax.  The heart is enlarged.
IMPRESSION: Worsening bilateral airspace disease.

## 2007-05-02 IMAGING — CR DG CHEST 1V PORT
1 series · 1 of 1 positions shown · non-contrast
Comparison: [DATE].

CLINICAL DATA: Leukocytosis.
 PORTABLE CHEST - 1 VIEW ? [G6] HOURS:

[view not recorded]
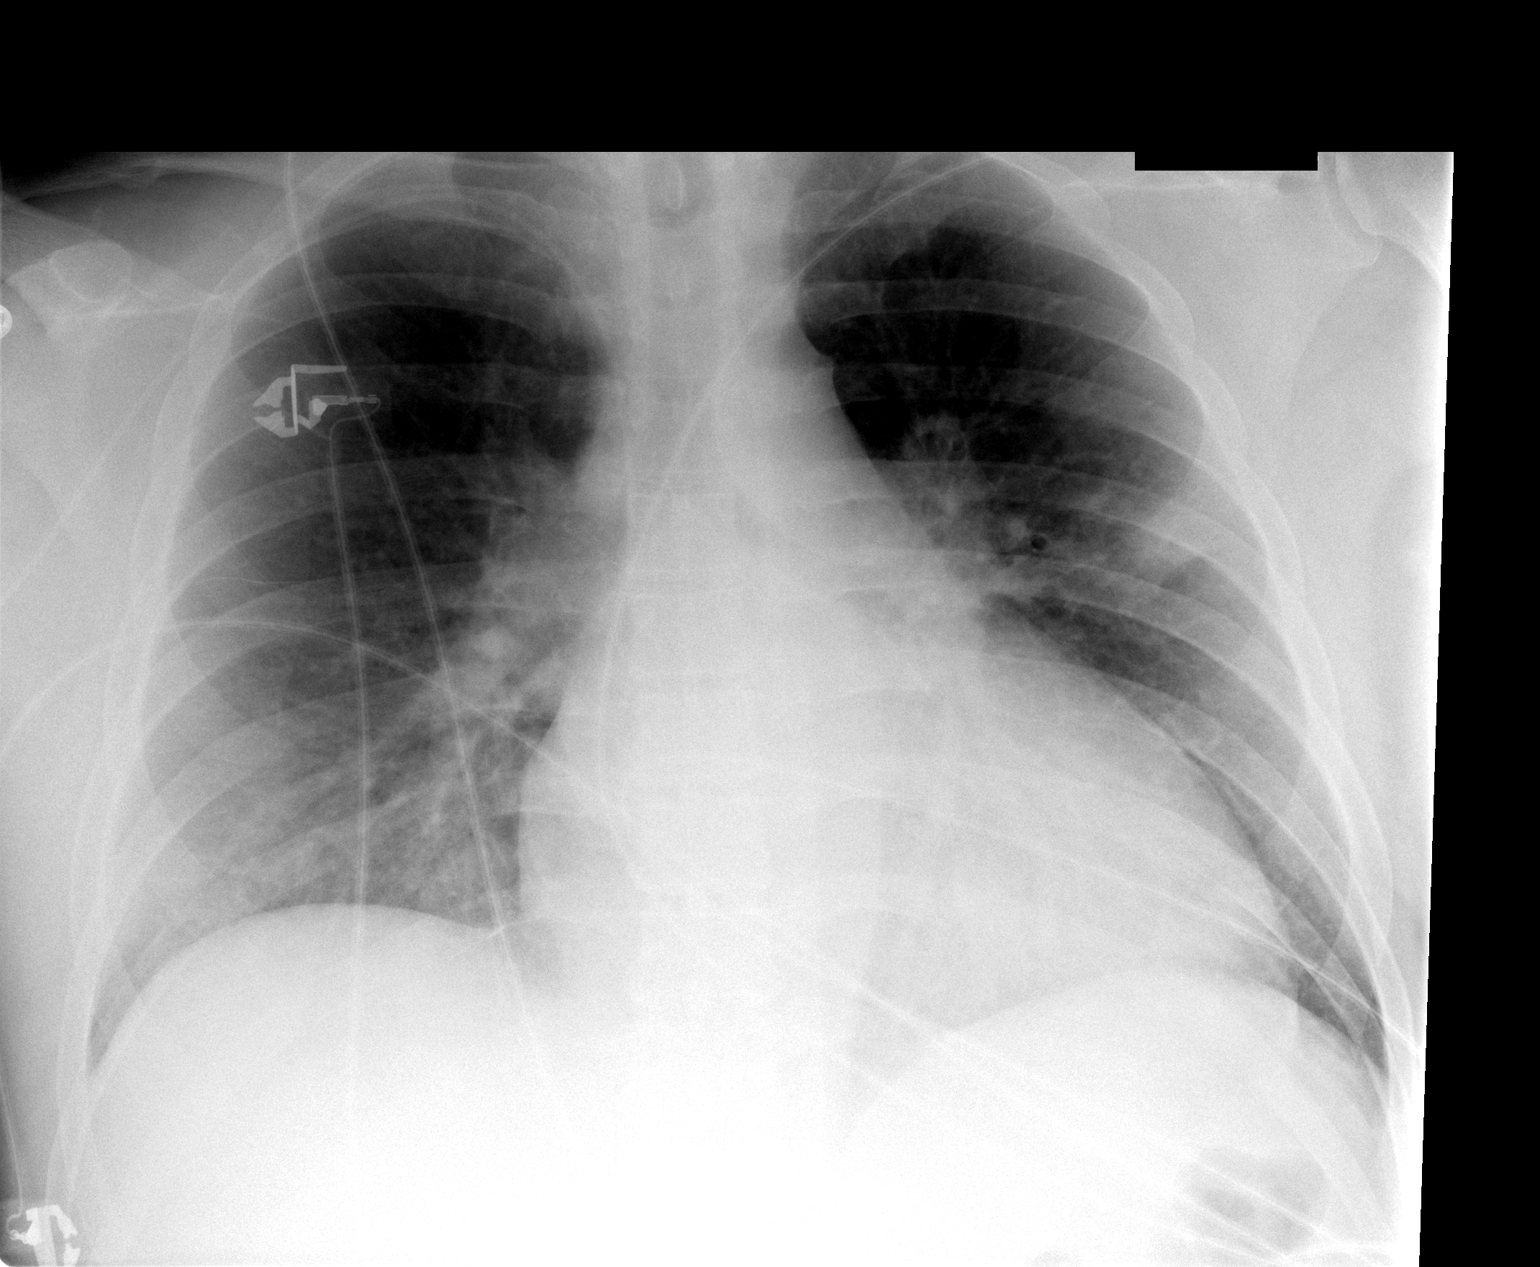

[1 of 1 positions shown; findings below may reference images not displayed]

FINDINGS: Bilateral airspace opacities have improved.  The heart remains enlarged.  The PICC is stable.  No pneumothorax.
IMPRESSION: Improved bilateral airspace disease.

## 2007-05-04 ENCOUNTER — Ambulatory Visit: Payer: Self-pay | Admitting: Infectious Diseases

## 2007-05-04 IMAGING — CR DG CHEST 1V PORT
1 series · 1 of 1 positions shown · non-contrast
Comparison: [DATE] and [DATE].

CLINICAL DATA: 40-year-old male with leukocytosis, ascites.
 PORTABLE CHEST ? 1 VIEW:

[view not recorded]
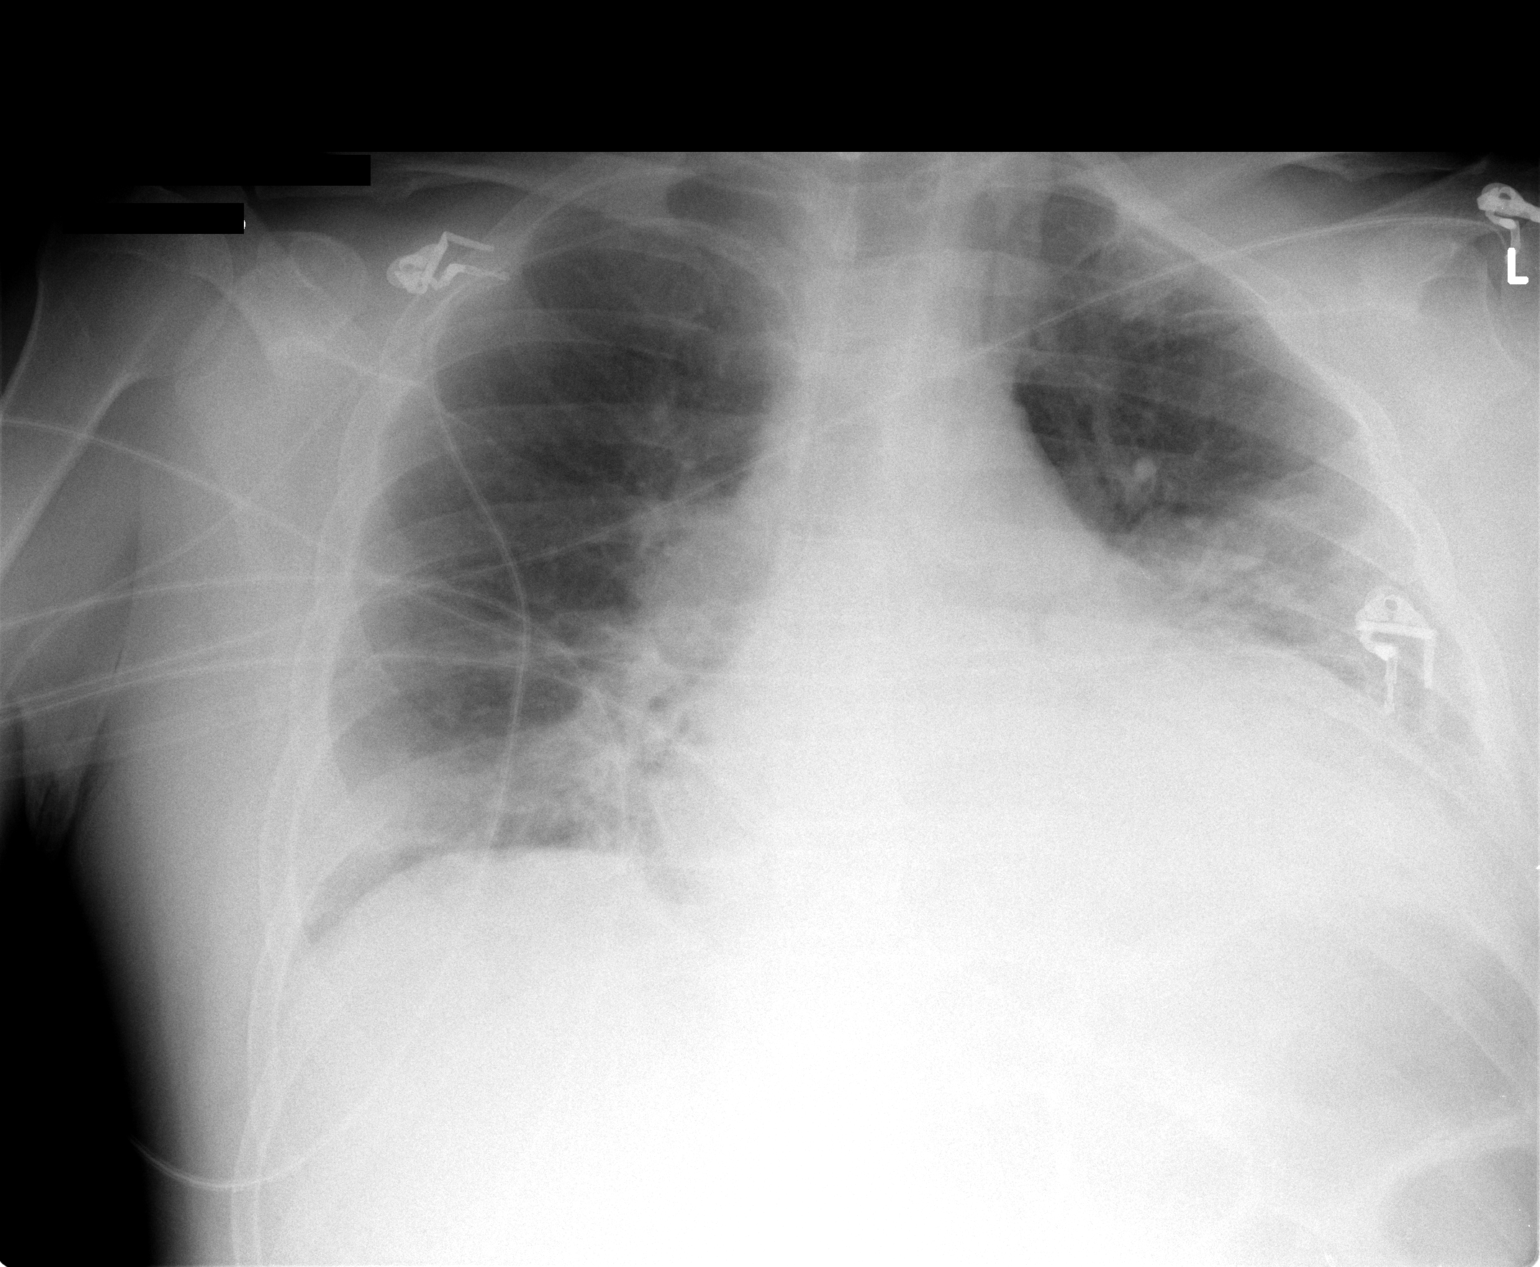

[1 of 1 positions shown; findings below may reference images not displayed]

FINDINGS: Right PICC line tip is in the SVC/RA junction.  The heart remains enlarged with worsening bibasilar and left hilar airspace disease versus asymmetric edema.  Lower lung volumes.  No large effusion or pneumothorax.
IMPRESSION: Worsening bibasilar and left hilar airspace disease, atelectasis or asymmetric edema.

## 2007-05-05 IMAGING — US US ABDOMEN LIMITED
1 series · 7 of 7 positions shown · non-contrast
Comparison: [DATE].

CLINICAL DATA: Ascites. 
 ABDOMEN ULTRASOUND LIMITED ? [DATE]:

[Series 1: unknown · 0.37mm/px · 7 of 7 slices shown]
[im 1/7]
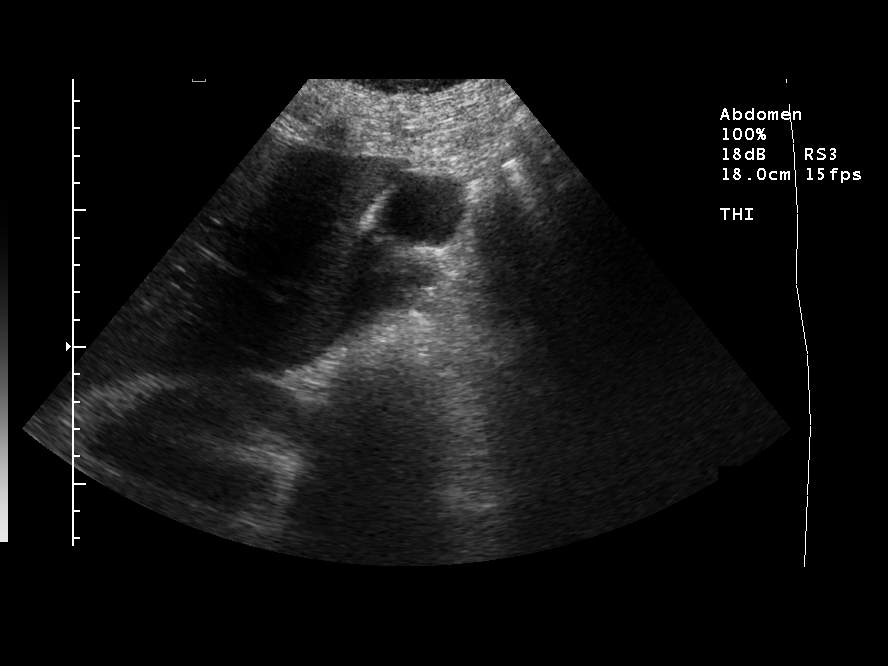
[im 2/7]
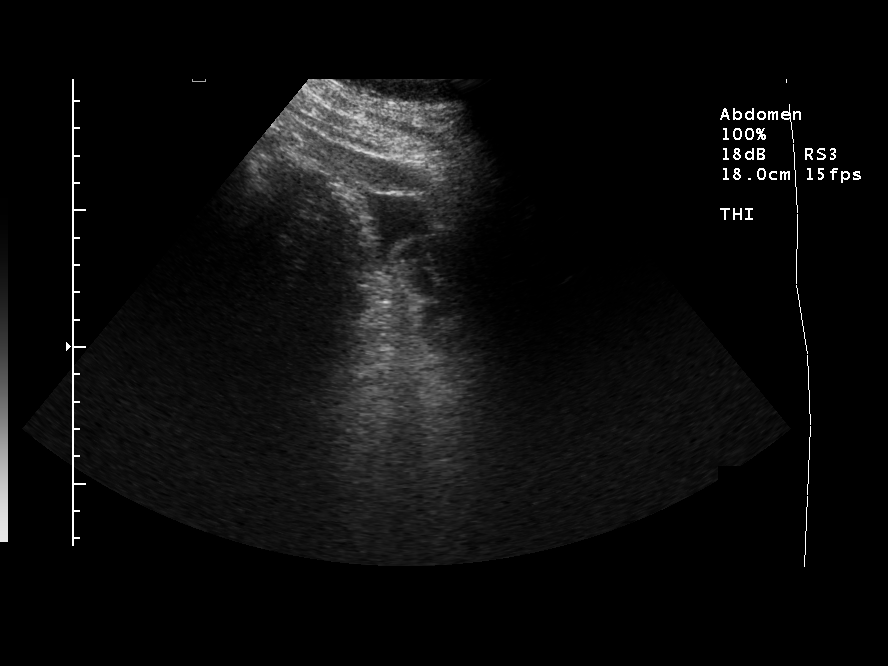
[im 3/7]
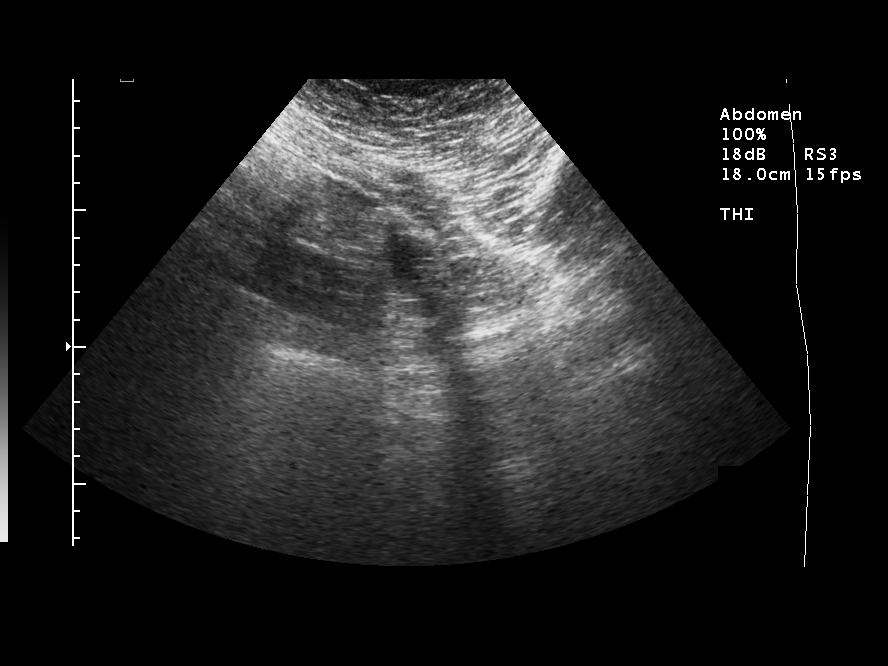
[im 4/7]
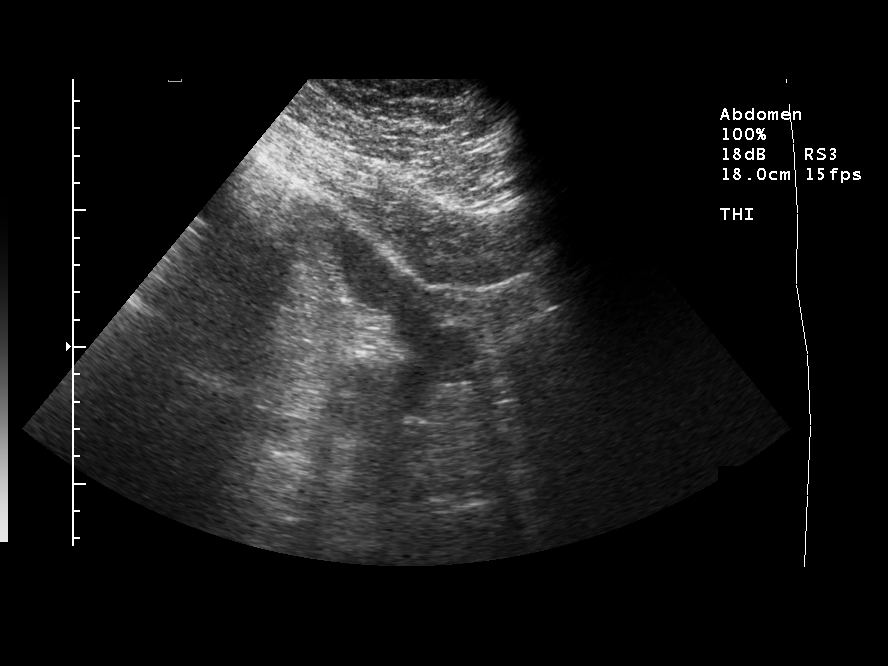
[im 5/7]
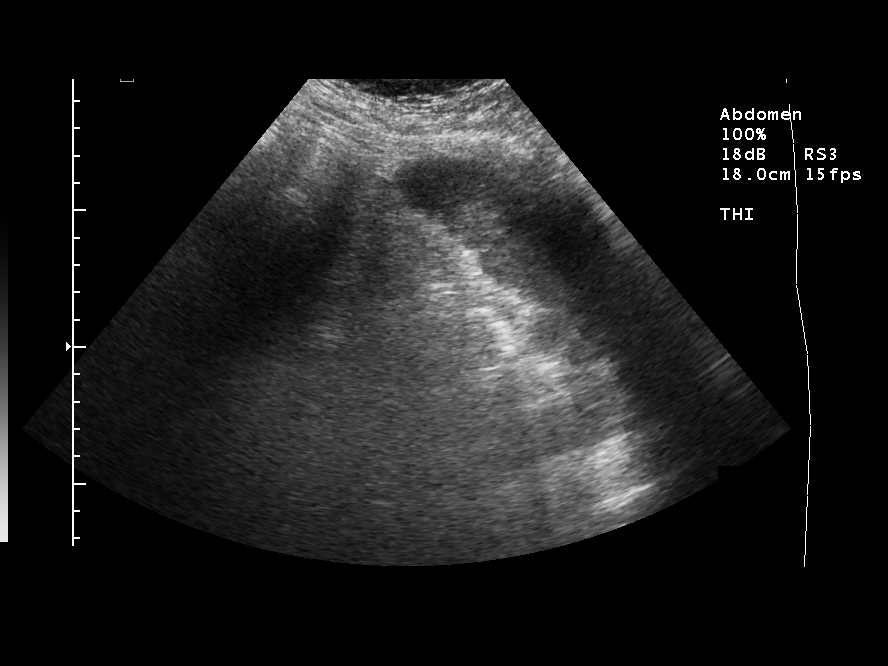
[im 6/7]
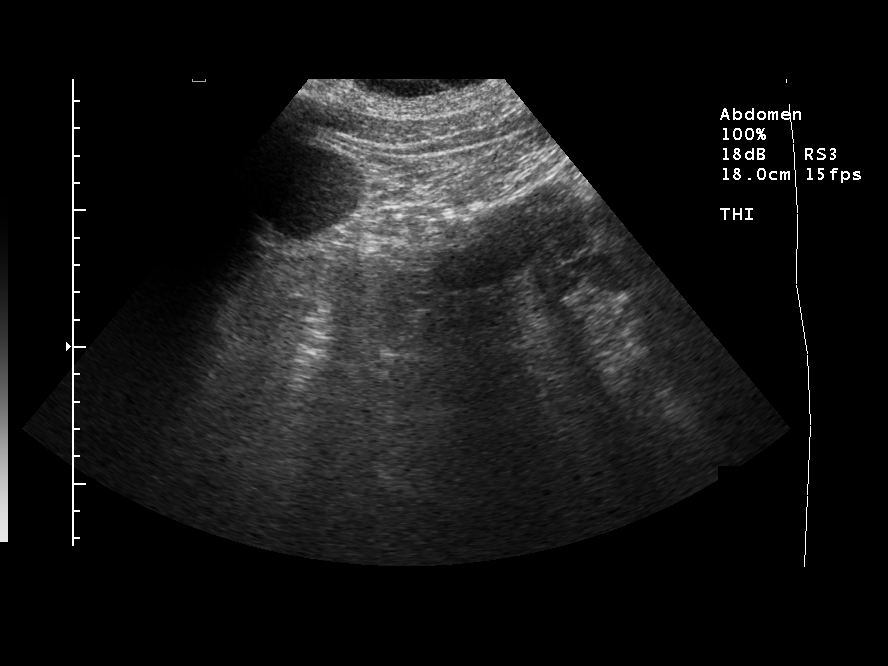
[im 7/7]
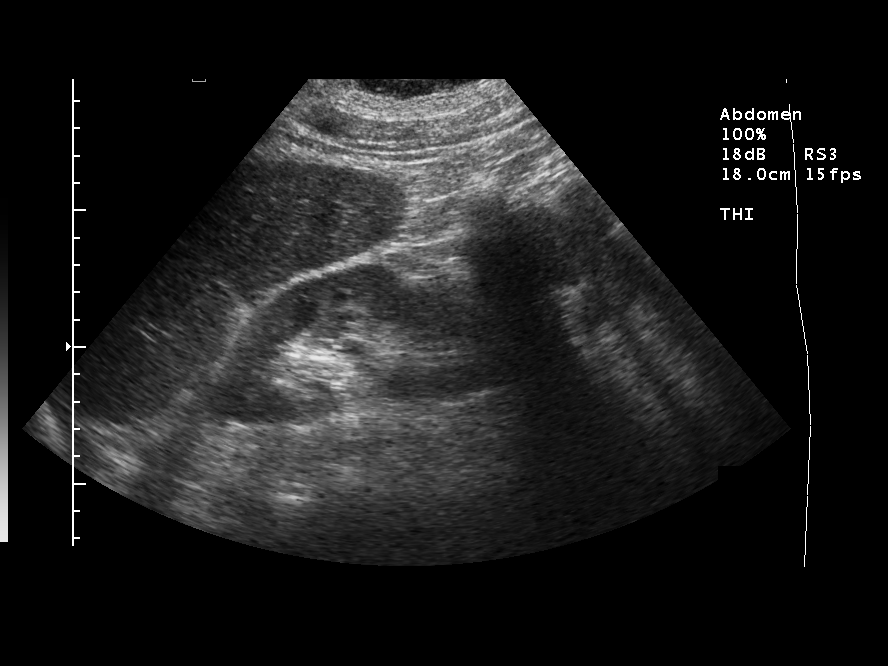

[7 of 7 positions shown; findings below may reference images not displayed]

FINDINGS: Fluid filled bowel noted without evidence of ascites.
IMPRESSION: No ascites.

## 2007-05-05 IMAGING — CR DG CHEST 1V PORT
1 series · 1 of 1 positions shown · non-contrast
Comparison: [DATE].

CLINICAL DATA: Leukocytosis. Ascites. 
 PORTABLE CHEST - 1 VIEW:

[view not recorded]
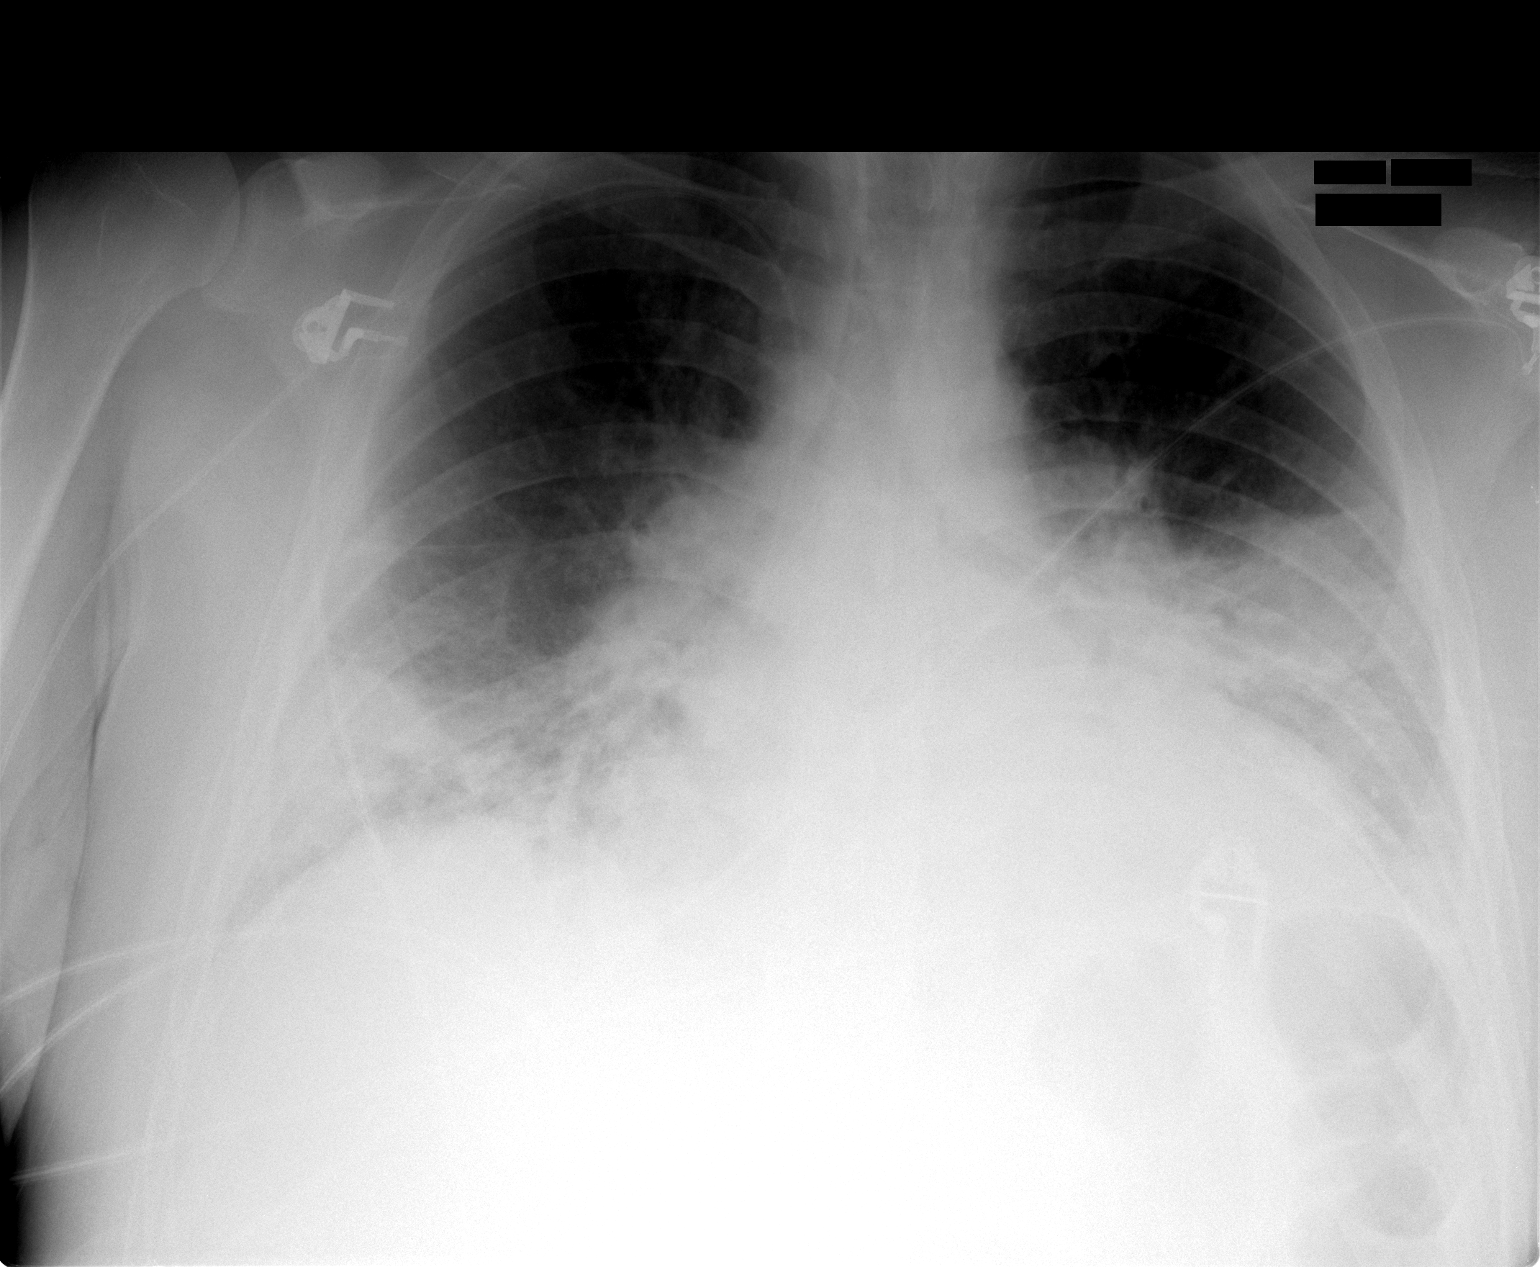

[1 of 1 positions shown; findings below may reference images not displayed]

FINDINGS: There has been interval worsening in aeration of the right lung base with persistent airspace disease in the left mid and lower lung zones. Cardiomegaly.  Likely left effusion.
IMPRESSION: Worsening aeration in the right base with no change on the left. The findings could be due to increasing pulmonary edema.  Pneumonia is not excluded.

## 2007-05-06 IMAGING — CR DG CHEST 1V PORT
1 series · 1 of 1 positions shown · non-contrast
Comparison: [DATE].

CLINICAL DATA: 40-year-old with leukocytosis.   Shortness of breath.
 PORTABLE CHEST - 1 VIEW:

[view not recorded]
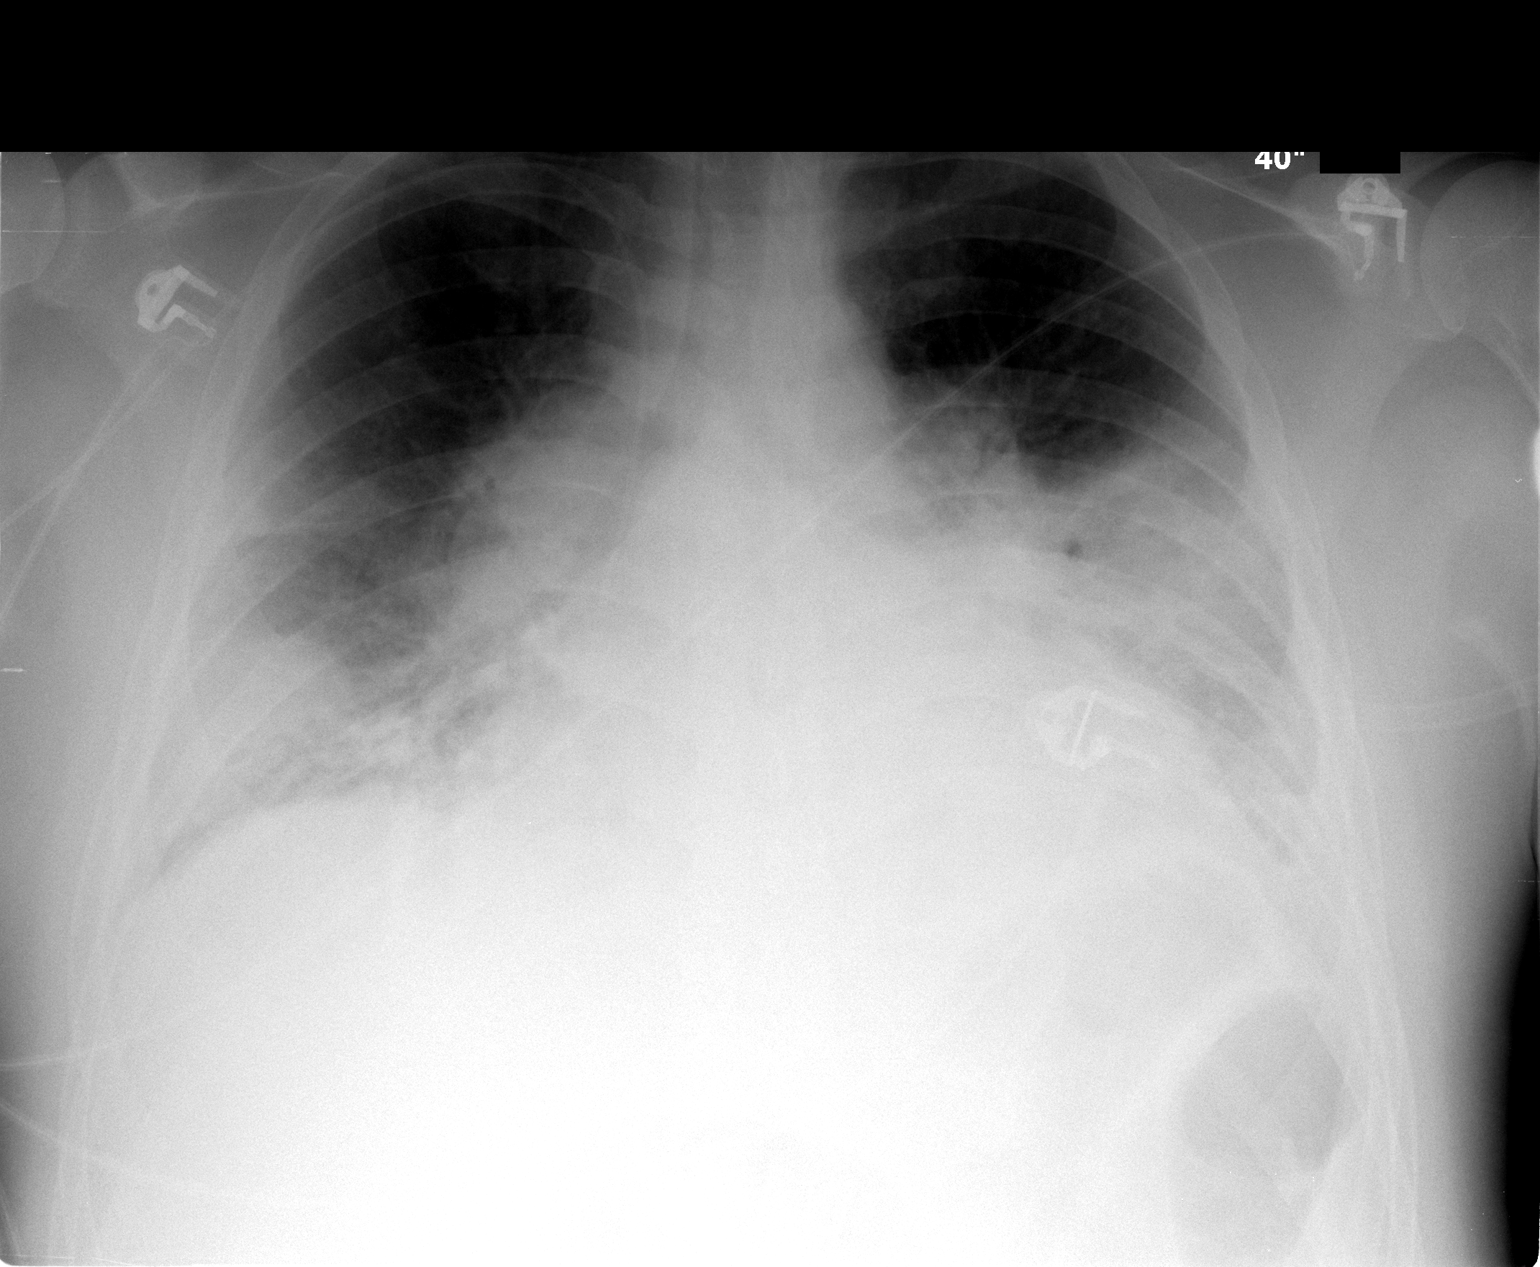

[1 of 1 positions shown; findings below may reference images not displayed]

FINDINGS: Portable exam at [DATE] a.m. shows right-sided PICC line with tip to the level of the superior vena cava.  The heart is enlarged.  Patchy bilateral lower lobe infiltrates appear stable compared to the prior study, partially obscuring the hemidiaphragms.
IMPRESSION: Stable appearance of patchy bilateral lower lobe infiltrates.

## 2007-06-09 ENCOUNTER — Ambulatory Visit: Payer: Self-pay | Admitting: Internal Medicine

## 2007-06-12 ENCOUNTER — Ambulatory Visit: Payer: Self-pay | Admitting: Cardiology

## 2007-06-12 ENCOUNTER — Ambulatory Visit: Payer: Self-pay | Admitting: Internal Medicine

## 2007-06-12 LAB — CONVERTED CEMR LAB
BUN: 39 mg/dL — ABNORMAL HIGH (ref 6–23)
CO2: 30 meq/L (ref 19–32)
Calcium: 9.9 mg/dL (ref 8.4–10.5)
Chloride: 95 meq/L — ABNORMAL LOW (ref 96–112)
Creatinine, Ser: 1.3 mg/dL (ref 0.4–1.5)
GFR calc Af Amer: 78 mL/min
GFR calc non Af Amer: 64 mL/min
Glucose, Bld: 159 mg/dL — ABNORMAL HIGH (ref 70–99)
Potassium: 3.8 meq/L (ref 3.5–5.1)
Pro B Natriuretic peptide (BNP): 344 pg/mL — ABNORMAL HIGH (ref 0.0–100.0)
Sodium: 135 meq/L (ref 135–145)

## 2007-06-18 ENCOUNTER — Ambulatory Visit: Payer: Self-pay | Admitting: Internal Medicine

## 2007-06-18 ENCOUNTER — Ambulatory Visit: Payer: Self-pay | Admitting: Cardiology

## 2007-06-25 ENCOUNTER — Ambulatory Visit: Payer: Self-pay | Admitting: Internal Medicine

## 2007-07-03 ENCOUNTER — Ambulatory Visit: Payer: Self-pay | Admitting: Cardiology

## 2007-07-03 ENCOUNTER — Ambulatory Visit: Payer: Self-pay | Admitting: Internal Medicine

## 2007-07-10 ENCOUNTER — Ambulatory Visit: Payer: Self-pay | Admitting: Cardiology

## 2007-07-16 ENCOUNTER — Ambulatory Visit: Payer: Self-pay | Admitting: Internal Medicine

## 2007-07-16 LAB — CONVERTED CEMR LAB
ALT: 22 units/L (ref 0–53)
AST: 20 units/L (ref 0–37)
Albumin: 4 g/dL (ref 3.5–5.2)
Alkaline Phosphatase: 56 units/L (ref 39–117)
BUN: 11 mg/dL (ref 6–23)
Bilirubin, Direct: 0.1 mg/dL (ref 0.0–0.3)
CO2: 31 meq/L (ref 19–32)
Calcium: 9.8 mg/dL (ref 8.4–10.5)
Chloride: 103 meq/L (ref 96–112)
Creatinine, Ser: 0.9 mg/dL (ref 0.4–1.5)
GFR calc Af Amer: 119 mL/min
GFR calc non Af Amer: 98 mL/min
Glucose, Bld: 108 mg/dL — ABNORMAL HIGH (ref 70–99)
Potassium: 4.1 meq/L (ref 3.5–5.1)
Pro B Natriuretic peptide (BNP): 95 pg/mL (ref 0.0–100.0)
Sodium: 140 meq/L (ref 135–145)
Total Bilirubin: 0.7 mg/dL (ref 0.3–1.2)
Total Protein: 7.6 g/dL (ref 6.0–8.3)

## 2007-07-24 ENCOUNTER — Ambulatory Visit: Payer: Self-pay | Admitting: Cardiology

## 2007-08-01 ENCOUNTER — Emergency Department (HOSPITAL_COMMUNITY): Admission: EM | Admit: 2007-08-01 | Discharge: 2007-08-01 | Payer: Self-pay | Admitting: Family Medicine

## 2007-08-07 ENCOUNTER — Ambulatory Visit: Payer: Self-pay | Admitting: Internal Medicine

## 2007-08-13 ENCOUNTER — Ambulatory Visit: Payer: Self-pay

## 2007-08-20 ENCOUNTER — Ambulatory Visit: Payer: Self-pay | Admitting: Internal Medicine

## 2007-09-03 ENCOUNTER — Ambulatory Visit: Payer: Self-pay | Admitting: Cardiology

## 2007-09-30 ENCOUNTER — Ambulatory Visit: Payer: Self-pay | Admitting: Cardiology

## 2007-09-30 ENCOUNTER — Ambulatory Visit: Payer: Self-pay | Admitting: Internal Medicine

## 2007-10-28 ENCOUNTER — Encounter: Payer: Self-pay | Admitting: Internal Medicine

## 2007-10-28 ENCOUNTER — Ambulatory Visit: Payer: Self-pay

## 2007-10-28 ENCOUNTER — Ambulatory Visit: Payer: Self-pay | Admitting: Internal Medicine

## 2007-12-10 ENCOUNTER — Ambulatory Visit: Payer: Self-pay | Admitting: Internal Medicine

## 2008-03-10 ENCOUNTER — Ambulatory Visit: Payer: Self-pay | Admitting: Internal Medicine

## 2008-03-22 ENCOUNTER — Ambulatory Visit: Payer: Self-pay | Admitting: Internal Medicine

## 2008-03-22 DIAGNOSIS — M109 Gout, unspecified: Secondary | ICD-10-CM | POA: Insufficient documentation

## 2008-03-22 DIAGNOSIS — L259 Unspecified contact dermatitis, unspecified cause: Secondary | ICD-10-CM | POA: Insufficient documentation

## 2008-03-22 DIAGNOSIS — I509 Heart failure, unspecified: Secondary | ICD-10-CM | POA: Insufficient documentation

## 2008-03-24 ENCOUNTER — Telehealth: Payer: Self-pay | Admitting: Internal Medicine

## 2008-05-25 ENCOUNTER — Ambulatory Visit: Payer: Self-pay | Admitting: Internal Medicine

## 2008-05-25 DIAGNOSIS — M25539 Pain in unspecified wrist: Secondary | ICD-10-CM | POA: Insufficient documentation

## 2008-05-25 DIAGNOSIS — R109 Unspecified abdominal pain: Secondary | ICD-10-CM | POA: Insufficient documentation

## 2008-05-26 ENCOUNTER — Telehealth: Payer: Self-pay | Admitting: Internal Medicine

## 2008-07-08 ENCOUNTER — Telehealth: Payer: Self-pay | Admitting: Internal Medicine

## 2008-07-19 IMAGING — CR DG CHEST 1V PORT
1 series · 1 of 1 positions shown · non-contrast
Comparison: none

CLINICAL DATA: Line placement. Leukocytosis.  
PORTABLE CHEST - 1 VIEW AT [K5] HOURS:

[view not recorded]
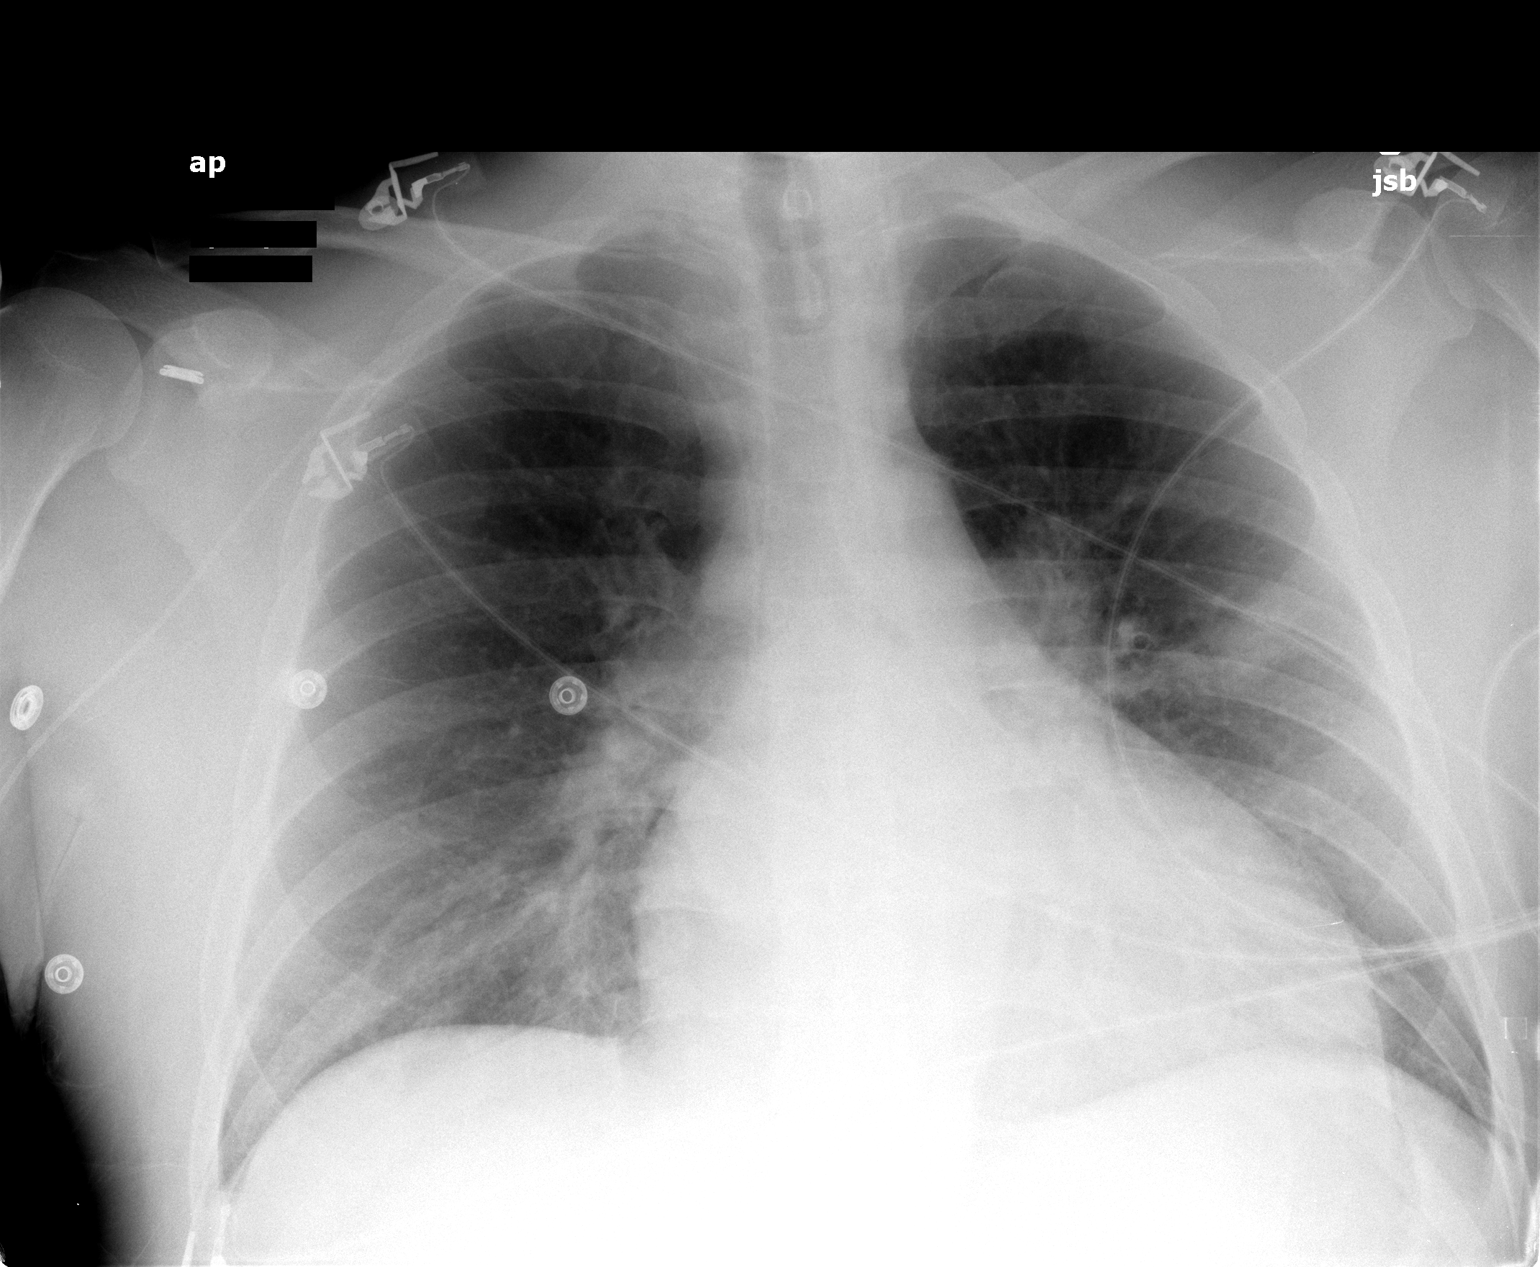

[1 of 1 positions shown; findings below may reference images not displayed]

FINDINGS: PICC line has been inserted via right upper extremity vein approach.   Tip of the catheter is in the SVC.  Enlarged cardiopericardial silhouette.  Suggestion of mild vascular congestion.  Faint focal opacity left mid lung zone is again noted.  It may represent residua of edema, atelectasis or infiltrate.
IMPRESSION: Specifically, the PICC line is in the SVC.

## 2008-07-20 IMAGING — CR DG CHEST 1V PORT
1 series · 1 of 1 positions shown · non-contrast
Comparison: [DATE].

CLINICAL DATA: Leukocytosis. 
 PORTABLE CHEST ([DATE] HOURS):

[view not recorded]
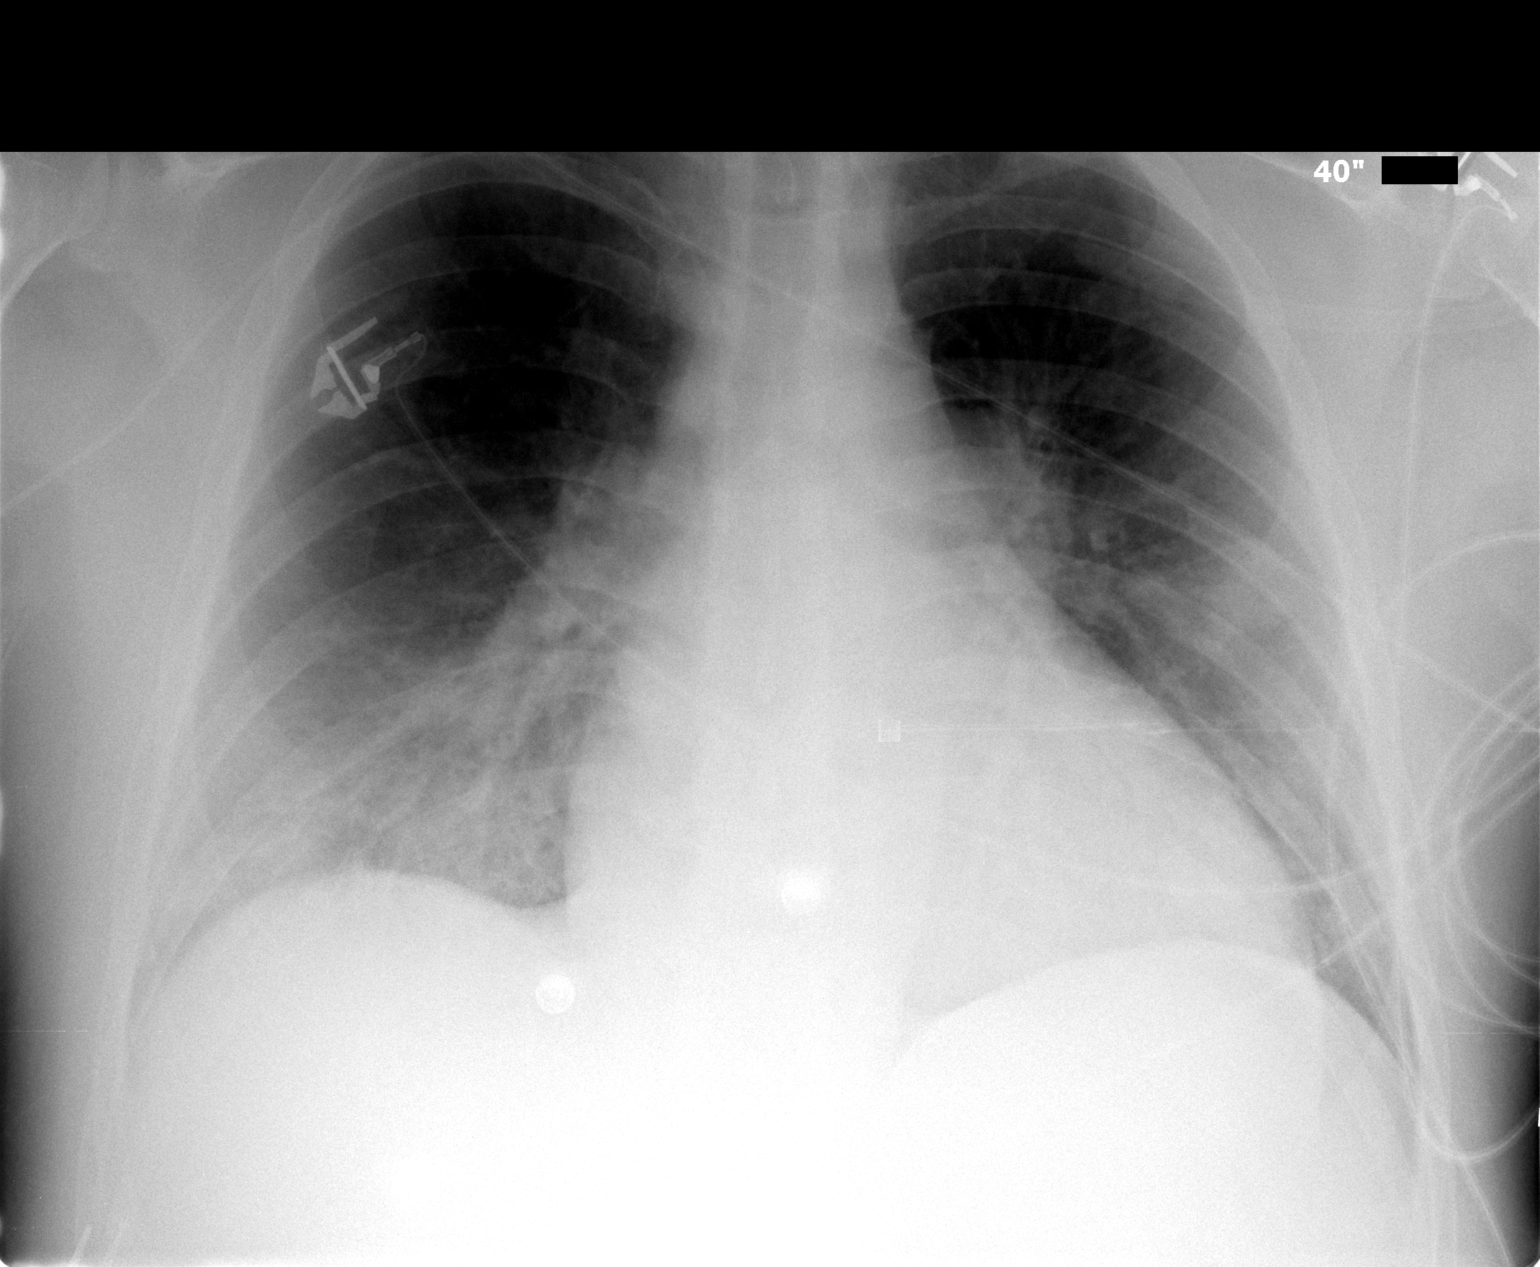

[1 of 1 positions shown; findings below may reference images not displayed]

FINDINGS: The heart is borderline enlarged.  The PICC is stable.  Pulmonary opacities at the right base have increased.  In the left midlung zone, a patchy density is stable.  No pneumothoraces or effusions are seen.
IMPRESSION: Patchy bilateral airspace disease stable on the left but increased on the right.

## 2008-08-26 ENCOUNTER — Ambulatory Visit (HOSPITAL_COMMUNITY): Admission: RE | Admit: 2008-08-26 | Discharge: 2008-08-26 | Payer: Self-pay | Admitting: Urology

## 2008-08-26 IMAGING — CR DG ABDOMEN 1V
1 series · 1 of 1 positions shown · non-contrast
Comparison: [DATE]

CLINICAL DATA: Right renal stone; preoperative exam for lithotripsy
today

ABDOMEN - 1 VIEW

[t abdomen supine]
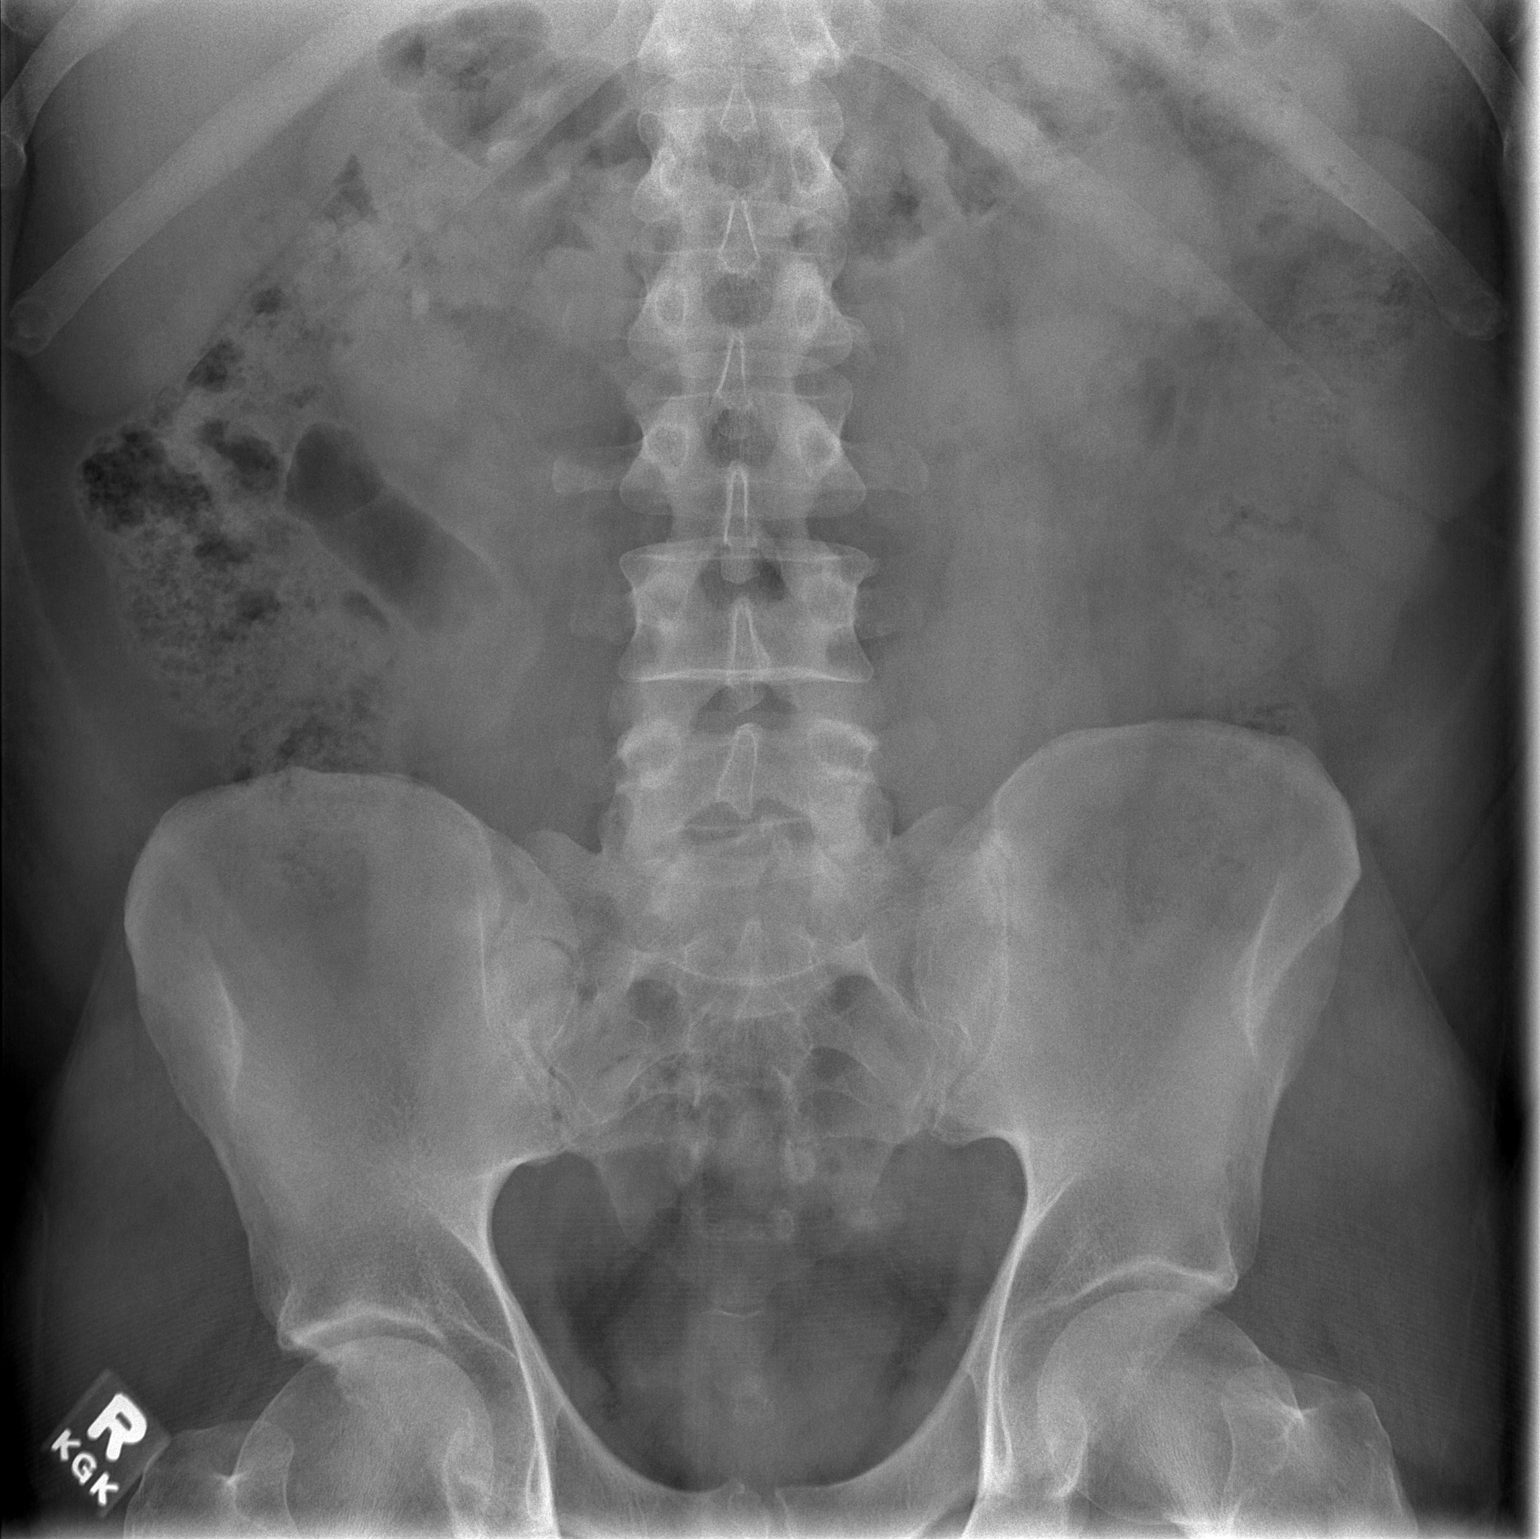

[1 of 1 positions shown; findings below may reference images not displayed]

FINDINGS: An 8mm calculus remains unchanged in size and position at
the inferior pole of the right renal outline.  I do not see a
calculus along the course of the right ureter.  No calculi are
identified within the left abdomen.  The stool and bowel gas
pattern is within normal limits.  The osseous structures are
unremarkable.
IMPRESSION: 8 mm calculus at the inferior pole of the right kidney, unchanged.

## 2008-10-26 ENCOUNTER — Ambulatory Visit: Payer: Self-pay

## 2008-10-26 ENCOUNTER — Encounter: Payer: Self-pay | Admitting: Internal Medicine

## 2008-10-26 ENCOUNTER — Ambulatory Visit: Payer: Self-pay | Admitting: Internal Medicine

## 2008-11-18 DIAGNOSIS — Z87442 Personal history of urinary calculi: Secondary | ICD-10-CM | POA: Insufficient documentation

## 2008-11-18 DIAGNOSIS — Z86718 Personal history of other venous thrombosis and embolism: Secondary | ICD-10-CM | POA: Insufficient documentation

## 2008-11-18 DIAGNOSIS — Z9189 Other specified personal risk factors, not elsewhere classified: Secondary | ICD-10-CM | POA: Insufficient documentation

## 2009-03-15 ENCOUNTER — Telehealth: Payer: Self-pay | Admitting: Internal Medicine

## 2009-03-28 ENCOUNTER — Telehealth (INDEPENDENT_AMBULATORY_CARE_PROVIDER_SITE_OTHER): Payer: Self-pay | Admitting: *Deleted

## 2009-03-29 ENCOUNTER — Ambulatory Visit: Payer: Self-pay | Admitting: Family Medicine

## 2009-03-29 DIAGNOSIS — M722 Plantar fascial fibromatosis: Secondary | ICD-10-CM | POA: Insufficient documentation

## 2009-10-20 ENCOUNTER — Ambulatory Visit: Payer: Self-pay | Admitting: Internal Medicine

## 2009-11-16 IMAGING — CR DG CHEST 2V
2 series · 2 of 2 positions shown · non-contrast
Comparison: [DATE]

CLINICAL DATA: Right renal stone.  Preop.

CHEST - 2 VIEW

[w chest pa]
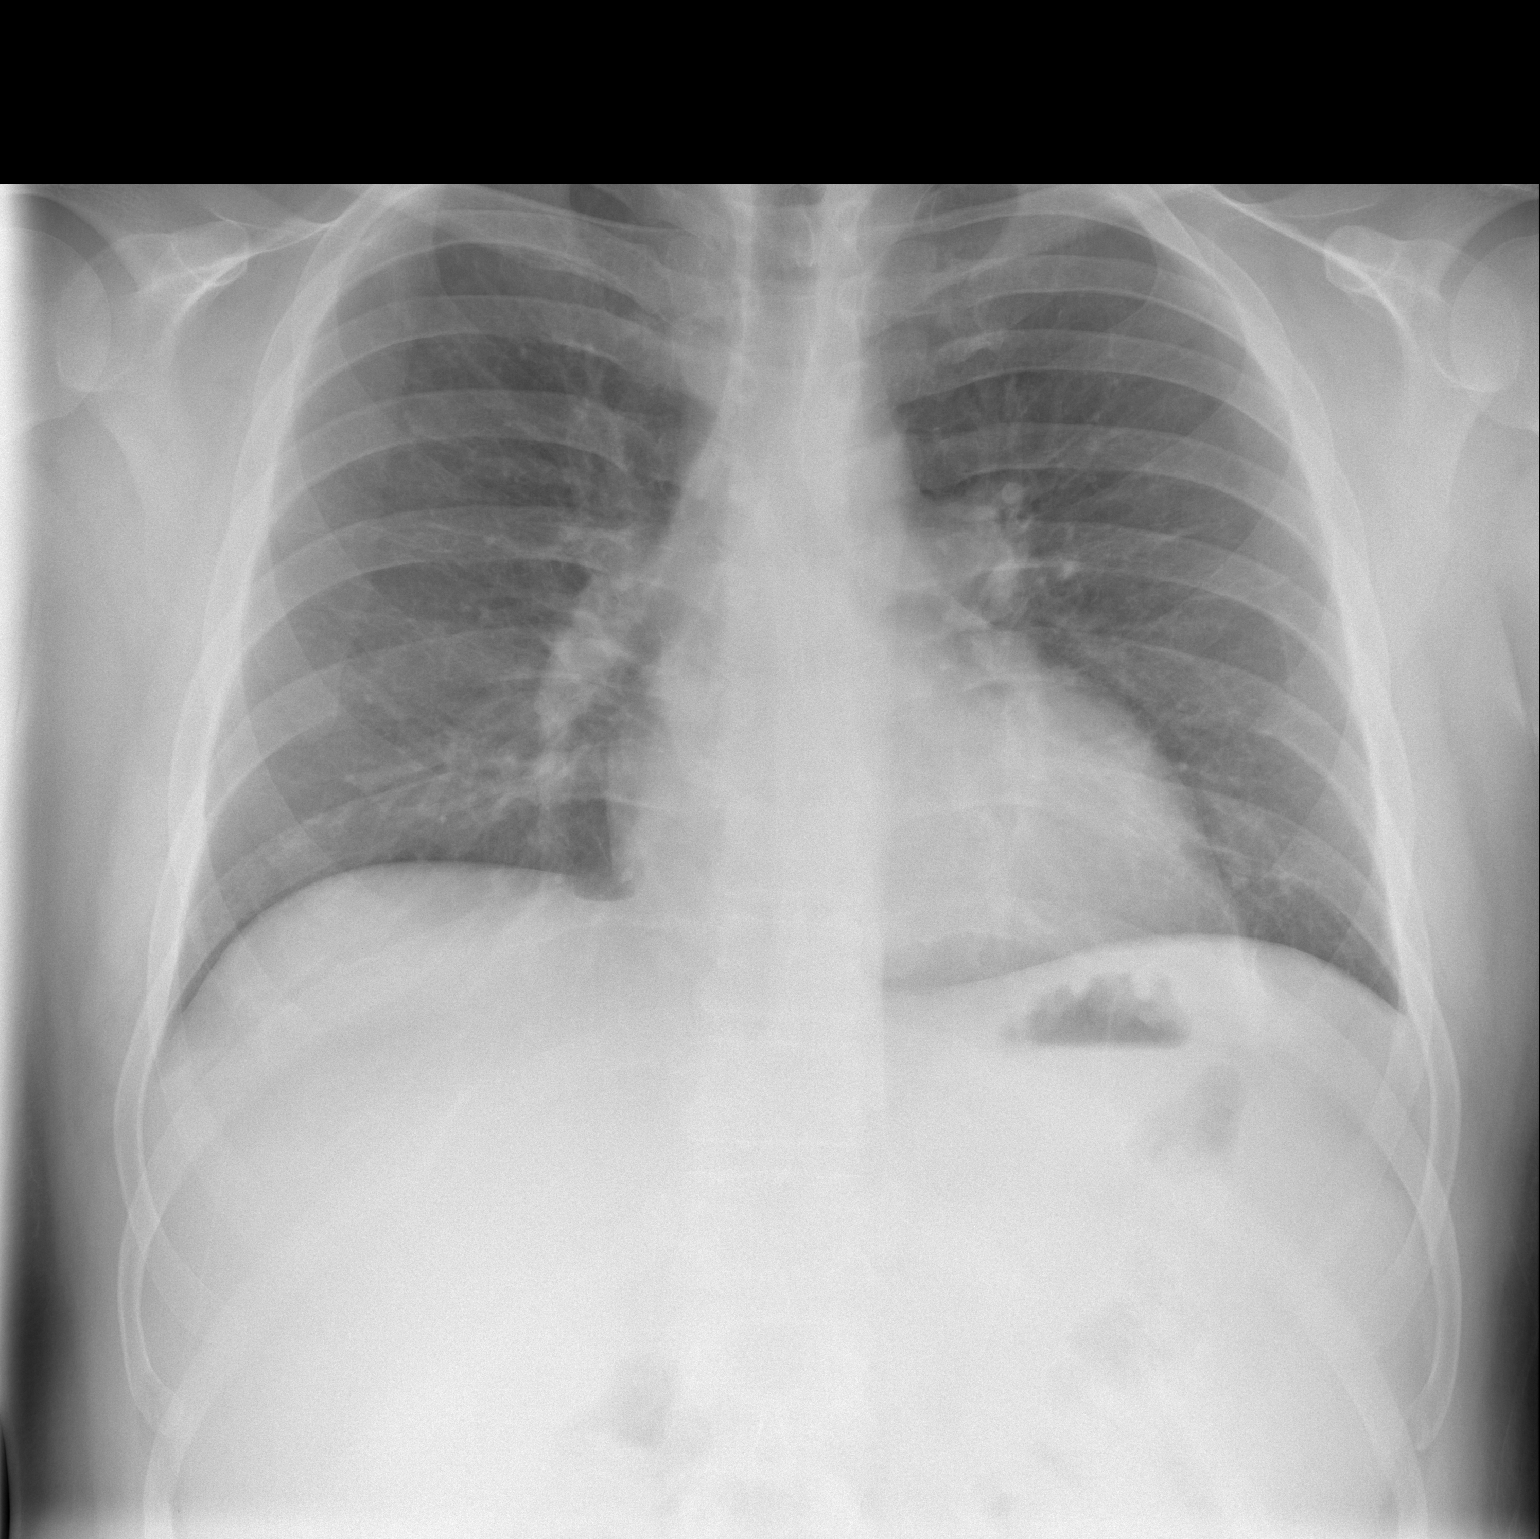

[w chest lat]
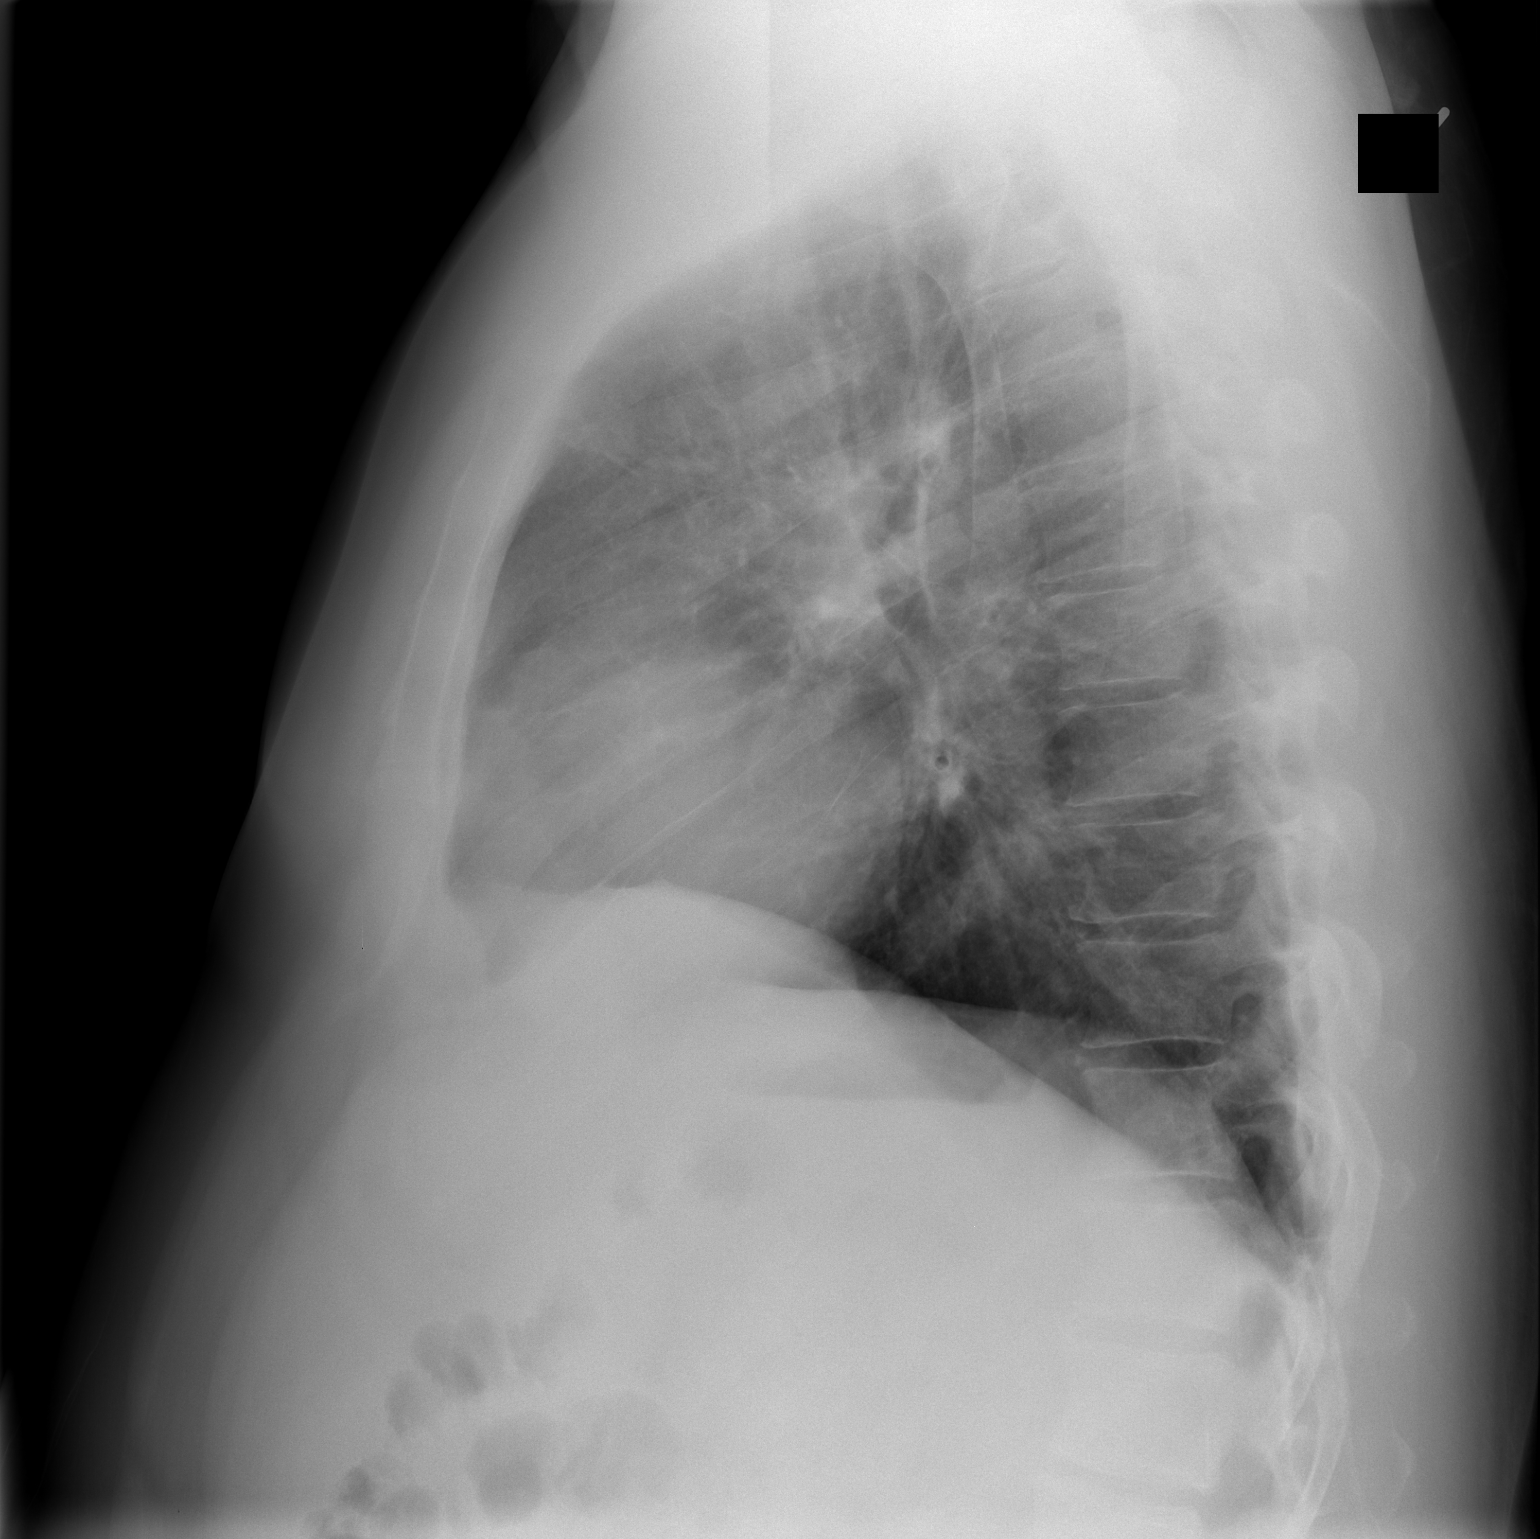

[2 of 2 positions shown; findings below may reference images not displayed]

FINDINGS: Midline trachea. Heart size upper limits of normal.
Otherwise normal mediastinal contours No pleural effusion or
pneumothorax. Diffuse peribronchial thickening.  Clear lungs.
IMPRESSION: 1.  No acute cardiopulmonary disease.
2.  Mild peribronchial thickening which may relate to chronic
bronchitis or smoking.

## 2009-11-18 ENCOUNTER — Telehealth: Payer: Self-pay | Admitting: Internal Medicine

## 2009-12-07 ENCOUNTER — Ambulatory Visit: Payer: Self-pay | Admitting: Internal Medicine

## 2009-12-07 DIAGNOSIS — E669 Obesity, unspecified: Secondary | ICD-10-CM | POA: Insufficient documentation

## 2009-12-07 LAB — CONVERTED CEMR LAB: Hgb A1c MFr Bld: 6.5 % (ref 4.6–6.5)

## 2009-12-08 LAB — CONVERTED CEMR LAB
BUN: 12 mg/dL (ref 6–23)
Basophils Absolute: 0 10*3/uL (ref 0.0–0.1)
Basophils Relative: 0.4 % (ref 0.0–3.0)
CO2: 27 meq/L (ref 19–32)
Calcium: 9.6 mg/dL (ref 8.4–10.5)
Chloride: 106 meq/L (ref 96–112)
Cholesterol: 203 mg/dL — ABNORMAL HIGH (ref 0–200)
Creatinine, Ser: 1 mg/dL (ref 0.4–1.5)
Direct LDL: 122.9 mg/dL
Eosinophils Absolute: 0.2 10*3/uL (ref 0.0–0.7)
Eosinophils Relative: 2.3 % (ref 0.0–5.0)
GFR calc non Af Amer: 85.84 mL/min (ref >60)
Glucose, Bld: 133 mg/dL — ABNORMAL HIGH (ref 70–99)
HCT: 41.8 % (ref 39.0–52.0)
HDL: 32.9 mg/dL — ABNORMAL LOW (ref >39.00)
Hemoglobin: 14.1 g/dL (ref 13.0–17.0)
Lymphocytes Relative: 31.3 % (ref 12.0–46.0)
Lymphs Abs: 3.1 10*3/uL (ref 0.7–4.0)
MCHC: 33.9 g/dL (ref 30.0–36.0)
MCV: 91.5 fL (ref 78.0–100.0)
Monocytes Absolute: 0.8 10*3/uL (ref 0.1–1.0)
Monocytes Relative: 7.7 % (ref 3.0–12.0)
Neutro Abs: 5.9 10*3/uL (ref 1.4–7.7)
Neutrophils Relative %: 58.3 % (ref 43.0–77.0)
Platelets: 254 10*3/uL (ref 150.0–400.0)
Potassium: 4.1 meq/L (ref 3.5–5.1)
RBC: 4.57 M/uL (ref 4.22–5.81)
RDW: 11.8 % (ref 11.5–14.6)
Sodium: 141 meq/L (ref 135–145)
TSH: 2.09 microintl units/mL (ref 0.35–5.50)
Total CHOL/HDL Ratio: 6
Triglycerides: 272 mg/dL — ABNORMAL HIGH (ref 0.0–149.0)
VLDL: 54.4 mg/dL — ABNORMAL HIGH (ref 0.0–40.0)
WBC: 10 10*3/uL (ref 4.5–10.5)

## 2010-02-02 ENCOUNTER — Telehealth: Payer: Self-pay | Admitting: Internal Medicine

## 2010-10-12 ENCOUNTER — Ambulatory Visit: Payer: Self-pay | Admitting: Internal Medicine

## 2010-11-01 ENCOUNTER — Telehealth: Payer: Self-pay | Admitting: Internal Medicine

## 2010-12-13 NOTE — Assessment & Plan Note (Signed)
Summary: follow up Cira Servant   Vital Signs:  Patient Profile:   45 Years Old Male Weight:      271 pounds Temp:     98.3 degrees F Pulse rate:   68 / minute BP sitting:   132 / 78  (left arm)  Vitals Entered By: Gladis Riffle, RN (Mar 22, 2008 1:27 PM)                 Chief Complaint:  to establish and referred by Dr Gilberto Better PCP--discuss skin problem and "tendonitis" right wrist.  History of Present Illness: Mr. Mccorkel is a very pleasant 45 year old male with a history of congestive heart failure secondary nonischemic cardiomyopathy with biventricular dysfunction in the setting of previous polysubstance abuse.  Subsequently his ejection fraction has returned to normal.   Overall, he is doing great.  He is back to work and is not having any chest pain or dyspnea and no lower extremity edema, no orthopnea, no PND, no palpitations.   He has had an echocardiogram today, which I have reviewed. The official read said an ejection fraction of 50%.  However, I think his LV function has returned completely to normal with an EF of at least 55%.   new problem---rash on lower extremities---dry/pruritic---ongoing for months (3-4 +). Some relief with otc hydrocortisone or eurcerin.   R wrist discmofort---he relates to overuse of computer---he has started a wrist stretching program with some relief.        Current Allergies: No known allergies   Past Medical History:    Congestive heart failure    Gout    tobacco abuse    PE---secondary to CHF---6 months of warfarin CHF has resolved.   Past Surgical History:    wisdom tooth extraction   Family History:    father---helathy    mother---alive and well   Social History:    hx of alcohol and tobacco abuse, polysubstance abuse    Married    1 adopted child    Regular exercise-no   Risk Factors:  Tobacco use:  current    Cigarettes:  Yes -- occ pack(s) per day Exercise:  no   Review of Systems       no other  complaints in a complete ROS    Physical Exam  General:     Well-developed,well-nourished,in no acute distress; alert,appropriate and cooperative throughout examination Head:     Normocephalic and atraumatic without obvious abnormalities. No apparent alopecia or balding. Eyes:     pupils equal and pupils round.   Ears:     R ear normal and L ear normal.   Neck:     No deformities, masses, or tenderness noted. Chest Wall:     No deformities, masses, tenderness or gynecomastia noted. Lungs:     Normal respiratory effort, chest expands symmetrically. Lungs are clear to auscultation, no crackles or wheezes. Heart:     Normal rate and regular rhythm. S1 and S2 normal without gallop, murmur, click, rub or other extra sounds. Abdomen:     Bowel sounds positive,abdomen soft and non-tender without masses, organomegaly or hernias noted. Msk:     No deformity or scoliosis noted of thoracic or lumbar spine.   Pulses:     R radial normal and L radial normal.   Neurologic:     cranial nerves II-XII intact and gait normal.   Skin:     patches of dry, erythematous lesions pretibial area on right Psych:     normally interactive  and good eye contact.      Impression & Recommendations:  Problem # 1:  CONGESTIVE HEART FAILURE (ICD-428.0) stable on meds His updated medication list for this problem includes:    Spironolactone 25 Mg Tabs (Spironolactone) .Marland Kitchen... Take 1 tablet by mouth once a day    Lasix 40 Mg Tabs (Furosemide) .Marland Kitchen... Take 1 tablet by mouth once a day as needed    Diovan 160 Mg Tabs (Valsartan) .Marland Kitchen... Take 1 tablet by mouth once a day    Coreg 25 Mg Tabs (Carvedilol) .Marland Kitchen... Take 1 tablet by mouth two times a day   Problem # 2:  GOUT (ICD-274.9) no recurrence His updated medication list for this problem includes:    Allopurinol 100 Mg Tabs (Allopurinol) .Marland Kitchen... 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 Apply two times a day   Complete Medication List: 1)  Spironolactone 25 Mg Tabs (Spironolactone) .... Take 1 tablet by mouth once a day 2)  Lasix 40 Mg Tabs (Furosemide) .... Take 1 tablet by mouth once a day as needed 3)  Diovan 160 Mg Tabs (Valsartan) .... Take 1 tablet by mouth once a day 4)  Allopurinol 100 Mg Tabs (Allopurinol) .... Take 1 tablet by mouth once a day 5)  Coreg 25 Mg Tabs (Carvedilol) .... Take 1 tablet by mouth two times a day 6)  Triamcinolone Acetonide 0.5 % Crea (Triamcinolone acetonide) .... Apply two times a day 7)  Zolpidem Tartrate 5 Mg Tabs (Zolpidem tartrate) .Marland Kitchen.. 1 by mouth at night as needed   Patient Instructions: 1)  x   Prescriptions: TRIAMCINOLONE ACETONIDE 0.5 %  CREA (TRIAMCINOLONE ACETONIDE) apply two times a day  #60 grams x 1   Entered and Authorized by:   Birdie Sons MD   Signed by:   Birdie Sons MD on 03/22/2008   Method used:   Electronically sent to ...       [facility] # [phone]*       [address]       [place], [place]  [address]       Ph: [phone] or [phone]       Fax: [phone]   RxID:   [id number]  ]

## 2010-12-13 NOTE — Progress Notes (Signed)
Summary: RX REFILL  Medications Added COREG 6.25 MG TABS (CARVEDILOL)  WARFARIN SODIUM 7.5 MG TABS (WARFARIN SODIUM) Take 1 tablet once a day       Phone Note Refill Request Call back at [phone] Message from:  Patient  Refills Requested: Medication #1:  DIOVAN 160 MG  TABS Take 1 tablet by mouth once a day   Supply Requested: 3 months  Medication #2:  ZOLPIDEM TARTRATE 5 MG  TABS 1 by mouth at night as needed Not to exceed 10 per month [facility]  FIRST RX WITH [facility]  MEMBER ID [id number]   ZOLPIDEM NEEDS PRIOR AUTHORIZATION  [phone]   Method Requested: Fax to Mail Away Pharmacy Initial call taken by: Burnard Leigh,  Mar 15, 2009 1:20 PM    New/Updated Medications: COREG 6.25 MG TABS (CARVEDILOL)  WARFARIN SODIUM 7.5 MG TABS (WARFARIN SODIUM) Take 1 tablet once a day   Prescriptions: DIOVAN 160 MG  TABS (VALSARTAN) Take 1 tablet by mouth once a day  #90 x 1   Entered by:   Laurance Flatten CMA   Authorized by:   Dolores Patty, MD, [facility]   Signed by:   Laurance Flatten CMA on 03/18/2009   Method used:   Electronically to        [facility] (mail-order)             ,          Ph: [phone]       Fax: [phone]   RxID:   [id number] DIOVAN 160 MG  TABS (VALSARTAN) Take 1 tablet by mouth once a day  #90 x 1   Entered by:   Laurance Flatten CMA   Authorized by:   Dolores Patty, MD, [facility]   Signed by:   Laurance Flatten CMA on 03/16/2009   Method used:   Electronically to        [facility] (mail-order)             ,          Ph: [phone]       Fax: [phone]   RxID:   [id number]

## 2010-12-13 NOTE — Progress Notes (Signed)
Summary: Ambien Rx request  Phone Note Call from Patient Call back at [phone] cell   Caller: Patient Call For: Zaydah Nawabi Summary of Call: Pt reports he saw Dr.Lorrayne Ismael yesterday and was told he would be sending more Ambien to his [facility] on [address].  Last filled 03/26/08 #10 X 1 RF Initial call taken by: Sid Falcon LPN,  May 26, 2008 10:02 AM  Follow-up for Phone Call        #10/1 refill Follow-up by: Birdie Sons MD,  May 27, 2008 6:44 AM  Additional Follow-up for Phone Call Additional follow up Details #1::        Rx called in, pt aware. Additional Follow-up by: Sid Falcon LPN,  May 27, 2008 8:00 AM      Prescriptions: ZOLPIDEM TARTRATE 5 MG  TABS (ZOLPIDEM TARTRATE) 1 by mouth at night as needed  #10 x 1   Entered by:   Sid Falcon LPN   Authorized by:   Birdie Sons MD   Signed by:   Sid Falcon LPN on 45/40/9811   Method used:   Telephoned to ...       [facility]       [address]       [place], [place]  [address]       Ph: [phone]       Fax: [phone]   RxID:   [id number]

## 2010-12-13 NOTE — Progress Notes (Signed)
Summary: refills  Phone Note Refill Request Message from:  Fax from Pharmacy on February 02, 2010 4:51 PM  Refills Requested: Medication #1:  ALLOPURINOL 100 MG  TABS Take 1 tablet by mouth once a day  Medication #2:  TRIAMCINOLONE ACETONIDE 0.5 %  CREA apply two times a day as needed  Medication #3:  MELOXICAM 15 MG TABS one by mouth once day Initial call taken by: Kern Reap CMA Duncan Dull),  February 02, 2010 4:51 PM Caller: [facility] Reason for Call: Needs renewal    Prescriptions: MELOXICAM 15 MG TABS (MELOXICAM) one by mouth once day  #90 x 3   Entered by:   Kern Reap CMA (AAMA)   Authorized by:   Birdie Sons MD   Signed by:   Kern Reap CMA (AAMA) on 02/02/2010   Method used:   Electronically to        [facility] (mail-order)             ,          Ph: [phone]       Fax: [phone]   RxID:   [id number] TRIAMCINOLONE ACETONIDE 0.5 %  CREA (TRIAMCINOLONE ACETONIDE) apply two times a day as needed  #30 grams x 1   Entered by:   Kern Reap CMA (AAMA)   Authorized by:   Birdie Sons MD   Signed by:   Kern Reap CMA (AAMA) on 02/02/2010   Method used:   Electronically to        [facility] (mail-order)             ,          Ph: [phone]       Fax: [phone]   RxID:   [id number] ALLOPURINOL 100 MG  TABS (ALLOPURINOL) Take 1 tablet by mouth once a day  #90 x 3   Entered by:   Kern Reap CMA (AAMA)   Authorized by:   Birdie Sons MD   Signed by:   Kern Reap CMA (AAMA) on 02/02/2010   Method used:   Electronically to        [facility] (mail-order)             ,          Ph: [phone]       Fax: [phone]   RxID:   [id number]

## 2010-12-13 NOTE — Progress Notes (Signed)
Summary: refill allopurinol  Phone Note Refill Request Message from:  Fax from Pharmacy  Refills Requested: Medication #1:  ALLOPURINOL 100 MG  TABS Take 1 tablet by mouth once a day   Last Refilled: 07/30/2009 [facility]     [place], [place]  [address] ph----[phone]     fax-----[phone]  Initial call taken by: Warnell Forester,  November 18, 2009 10:00 AM    Prescriptions: ALLOPURINOL 100 MG  TABS (ALLOPURINOL) Take 1 tablet by mouth once a day  #90 x 0   Entered by:   Gladis Riffle, RN   Authorized by:   Birdie Sons MD   Signed by:   Gladis Riffle, RN on 11/18/2009   Method used:   Faxed to ...       [facility] (mail-order)       [id number]       [place], [place]  [address]       Ph: [phone]       Fax: [phone]   RxID:   [id number]  Rx asa above x1 as needs ov, last seen 7/09.  Appt made for rov.

## 2010-12-13 NOTE — Progress Notes (Signed)
Summary: Rx Ambien request  Phone Note Call from Patient   Caller: Patient Call For: Dr. Cato Mulligan Summary of Call: Pt. calls stating he needs refill on his generic Ambien 5 mg. called to [facility])  His prescription (today) was sent to the wrong pharmacy so that was sent to the correct pharmacy.  He would like a 30 day supply. Initial call taken by: Lynann Beaver CMA,  Mar 24, 2008 3:57 PM  Follow-up for Phone Call        He should taper off this medication. We will prescribe for the next 2 months. He should not use more than 10 per month. Ambien 5 mg one p.o. q.h.s. p.r.n. #10, one refill. (5mg  was on list.  Correct to 5mg  per Dr. Cato Mulligan.  Done.) Follow-up by: Birdie Sons MD,  Mar 25, 2008 7:56 AM  Additional Follow-up for Phone Call Additional follow up Details #1::        Med called in.  LMTCB for patient. [name], RN  Mar 25, 2008 12:08 PM     Additional Follow-up for Phone Call Additional follow up Details #2::    Pt called back, Rx Ambien 5 mg called in, message given. Follow-up by: Sid Falcon LPN,  Mar 26, 2008 8:30 AM    Prescriptions: ZOLPIDEM TARTRATE 5 MG  TABS (ZOLPIDEM TARTRATE) 1 by mouth at night as needed  #10 x 1   Entered by:   Sid Falcon LPN   Authorized by:   Birdie Sons MD   Signed by:   Sid Falcon LPN on 16/08/9603   Method used:   Telephoned to ...       [facility]       [address]       [place], [place]  [address]       Ph: [phone]       Fax: [phone]   RxID:   [id number] TRIAMCINOLONE ACETONIDE 0.5 %  CREA (TRIAMCINOLONE ACETONIDE) apply two times a day  #60 grams x 1   Entered by:   Lynann Beaver CMA   Authorized by:   Birdie Sons MD   Signed by:   Lynann Beaver CMA on 03/24/2008   Method used:   Electronically sent to ...       [facility]       [address]       [place], [place]  [address]       Ph: [phone]       Fax:  [phone]   RxID:   [id number]

## 2010-12-13 NOTE — Progress Notes (Signed)
Summary: kidney stone referral  Phone Note Call from Patient Call back at [phone]   Caller: vm Call For: Jason Floyd Summary of Call: Referral to urologist for kidney stone that is on the move. Initial call taken by: [name], RN,  July 08, 2008 9:39 AM  Follow-up for Phone Call        Call [facility] for appt, they will see you.  [phone]. Follow-up by: [name], RN,  July 08, 2008 10:18 AM

## 2010-12-13 NOTE — Assessment & Plan Note (Signed)
Summary: med review/et   Vital Signs:  Patient profile:   45 year old male Weight:      266 pounds Temp:     98.2 degrees F Pulse rate:   72 / minute Resp:     12 per minute BP sitting:   114 / 70  (left arm)  Vitals Entered By: Gladis Riffle, RN (December 07, 2009 10:57 AM)   History of Present Illness: comes in with several complaints  plantar fasciitis---long term issue---he is good about stretching---he says meloxicam helps  obesity---trying to lose weight  Nonischemic cardiomyopathy---in setting of polysubstance abuse---EF completely resolved  Uses Ambien because of varied schedule  Has a winter dry skin syndrome for years---uses intermittent triamcinolone with relief.   Reviewed CV previous note.   All other systems reviewed and were negative   Preventive Screening-Counseling & Management  Alcohol-Tobacco     Smoking Status: current     Packs/Day: occ  Allergies (verified): No Known Drug Allergies  Comments:  Nurse/Medical Assistant: med review, needs refills  The patient's medications and allergies were reviewed with the patient and were updated in the Medication and Allergy Lists. Gladis Riffle, RN (December 07, 2009 11:00 AM)  Past History:  Past Medical History: Last updated: 10/20/2009 PULMONARY EMBOLISM, HX OF (ICD-V12.51) NEPHROLITHIASIS, HX OF (ICD-V13.01) DRUG ABUSE, HX OF (ICD-V15.89) GROIN PAIN (ICD-789.09) WRIST PAIN, RIGHT (ICD-719.43) NUMMULAR ECZEMA (ICD-692.9) GOUT (ICD-274.9) CONGESTIVE HEART FAILURE (ICD-428.0)   --resolved  Past Surgical History: Last updated: 11/18/2008 WISDOM TEETH EXTRACTION, HX OF (ICD-V15.9)  Family History: Last updated: 03/22/2008 father---helathy mother---alive and well   Social History: Last updated: 03/22/2008 hx of alcohol and tobacco abuse, polysubstance abuse Married 1 adopted child Regular exercise-no  Risk Factors: Exercise: no (03/22/2008)  Risk Factors: Smoking Status: current  (12/07/2009) Packs/Day: occ (12/07/2009)  Physical Exam  General:  Well-developed,well-nourished,in no acute distress; alert,appropriate and cooperative throughout examination Head:  normocephalic and atraumatic.   Eyes:  pupils equal and pupils round.   Ears:  R ear normal and L ear normal.   Neck:  No deformities, masses, or tenderness noted. Chest Wall:  No deformities, masses, tenderness or gynecomastia noted. Lungs:  normal respiratory effort and no intercostal retractions.   Heart:  normal rate and regular rhythm.   Abdomen:  soft and non-tender.  obese Msk:  No deformity or scoliosis noted of thoracic or lumbar spine.   Neurologic:  cranial nerves II-XII intact and gait normal.     Impression & Recommendations:  Problem # 1:  FASCIITIS, PLANTAR (ICD-728.71) discused need for aggressive weight loss stretching , ice to area His updated medication list for this problem includes:    Meloxicam 15 Mg Tabs (Meloxicam) ..... One by mouth once day  Problem # 2:  OBESITY (ICD-278.00) needs dramatic weight loss daily exercise low calorie diet  Problem # 3:  CONGESTIVE HEART FAILURE (ICD-428.0) probably drug related previously sxs and EF completely normalized His updated medication list for this problem includes:    Spironolactone 25 Mg Tabs (Spironolactone) .Marland Kitchen... Take 1 tablet by mouth once a day    Diovan 160 Mg Tabs (Valsartan) .Marland Kitchen... Take 1 tablet by mouth once a day    Carvedilol 12.5 Mg Tabs (Carvedilol) .Marland Kitchen... Take one tablet by mouth twice a day  Orders: Venipuncture ([address]109) TLB-Lipid Panel (80061-LIPID) TLB-BMP (Basic Metabolic Panel-BMET) (80048-METABOL) TLB-CBC Platelet - w/Differential (85025-CBCD) TLB-TSH (Thyroid Stimulating Hormone) (84443-TSH)  Complete Medication List: 1)  Spironolactone 25 Mg Tabs (Spironolactone) .... Take 1 tablet by mouth  once a day 2)  Diovan 160 Mg Tabs (Valsartan) .... Take 1 tablet by mouth once a day 3)  Allopurinol 100 Mg Tabs  (Allopurinol) .... Take 1 tablet by mouth once a day 4)  Carvedilol 12.5 Mg Tabs (Carvedilol) .... Take one tablet by mouth twice a day 5)  Triamcinolone Acetonide 0.5 % Crea (Triamcinolone acetonide) .... Apply two times a day as needed 6)  Zolpidem Tartrate 5 Mg Tabs (Zolpidem tartrate) .Marland Kitchen.. 1 by mouth at night as needed not to exceed 10 per month 7)  Meloxicam 15 Mg Tabs (Meloxicam) .... One by mouth once day 8)  Zyrtec Allergy 10 Mg Tabs (Cetirizine hcl) .... Once daily Prescriptions: ZOLPIDEM TARTRATE 5 MG  TABS (ZOLPIDEM TARTRATE) 1 by mouth at night as needed Not to exceed 10 per month  #20 x 1   Entered and Authorized by:   Birdie Sons MD   Signed by:   Birdie Sons MD on 12/07/2009   Method used:   Print then Give to Patient   RxID:   [id number] MELOXICAM 15 MG TABS (MELOXICAM) one by mouth once day  #90 x 3   Entered and Authorized by:   Birdie Sons MD   Signed by:   Birdie Sons MD on 12/07/2009   Method used:   Electronically to        [facility] (retail)       [address]       [place], [place]  [address]       Ph: [phone]       Fax: [phone]   RxID:   [id number] TRIAMCINOLONE ACETONIDE 0.5 %  CREA (TRIAMCINOLONE ACETONIDE) apply two times a day as needed  #30 grams x 1   Entered and Authorized by:   Birdie Sons MD   Signed by:   Birdie Sons MD on 12/07/2009   Method used:   Electronically to        [facility] (retail)       [address]       [place], [place]  [address]       Ph: [phone]       Fax: [phone]   RxID:   [id number] ALLOPURINOL 100 MG  TABS (ALLOPURINOL) Take 1 tablet by mouth once a day  #90 x 3   Entered and Authorized by:   Birdie Sons MD   Signed by:   Birdie Sons MD on 12/07/2009   Method used:   Electronically to        [facility] (retail)       [address]       [place], [place]  [address]       Ph:  [phone]       Fax: [phone]   RxID:   [id number]

## 2010-12-13 NOTE — Assessment & Plan Note (Signed)
Summary: f1y   Visit Type:  Follow-up Primary Provider:  Birdie Sons MD  CC:  no complaints.  History of Present Illness:  Jason Floyd is a very pleasant 45 year old male with the history of severe congestive heart failure secondary to nonischemic cardiomyopathy with biventricular dysfunction in the setting of previous polysubstance abuse.  His ejection fraction has returned totally to normal. Also has a h/o obesity and gout.  He returns for 1 year followup. No CP or SOB. Is staying active with his dogs. Walking/jogging on TM. Weight down about 20 pounds.  Dizziness resolved after cutting back carvedilol.    Current Medications (verified): 1)  Spironolactone 25 Mg  Tabs (Spironolactone) .... Take 1 Tablet By Mouth Once A Day 2)  Diovan 160 Mg  Tabs (Valsartan) .... Take 1 Tablet By Mouth Once A Day 3)  Allopurinol 100 Mg  Tabs (Allopurinol) .... Take 1 Tablet By Mouth Once A Day 4)  Carvedilol 12.5 Mg Tabs (Carvedilol) .... Take One Tablet By Mouth Twice A Day 5)  Triamcinolone Acetonide 0.5 %  Crea (Triamcinolone Acetonide) .... Apply Two Times A Day As Needed 6)  Zolpidem Tartrate 5 Mg  Tabs (Zolpidem Tartrate) .Marland Kitchen.. 1 By Mouth At Night As Needed Not To Exceed 10 Per Month 7)  Meloxicam 15 Mg Tabs (Meloxicam) .... One By Mouth Once Day 8)  Zyrtec Allergy 10 Mg Tabs (Cetirizine Hcl) .... Once Daily  Allergies (verified): No Known Drug Allergies  Past History:  Past Medical History: Reviewed history from 10/20/2009 and no changes required. PULMONARY EMBOLISM, HX OF (ICD-V12.51) NEPHROLITHIASIS, HX OF (ICD-V13.01) DRUG ABUSE, HX OF (ICD-V15.89) GROIN PAIN (ICD-789.09) WRIST PAIN, RIGHT (ICD-719.43) NUMMULAR ECZEMA (ICD-692.9) GOUT (ICD-274.9) CONGESTIVE HEART FAILURE (ICD-428.0)   --resolved  Review of Systems       As per HPI and past medical history; otherwise all systems negative.   Vital Signs:  Patient profile:   45 year old male Height:      74 inches Weight:       260 pounds BMI:     33.50 Pulse rate:   66 / minute BP sitting:   108 / 70  (left arm) Cuff size:   large  Vitals Entered By: Hardin Negus, RMA (October 12, 2010 11:09 AM)  Physical Exam  General:  Well appearing. no resp difficulty HEENT: normal Neck: supple. no JVD. Carotids 2+ bilat; no bruits. No lymphadenopathy or thryomegaly appreciated. Cor: PMI nondisplaced. Regular rate & rhythm. No rubs, gallops, murmur. Lungs: clear Abdomen: soft, nontender, nondistended. No hepatosplenomegaly. No bruits or masses. Good bowel sounds. Extremities: no cyanosis, clubbing, rash, edema Neuro: alert & orientedx3, cranial nerves grossly intact. moves all 4 extremities w/o difficulty. affect pleasant    Impression & Recommendations:  Problem # 1:  CONGESTIVE HEART FAILURE (ICD-428.0) EF normalized. Doing well. Will stop spirononlactone. Can take as needed if develops any edema. Continue b-block/ARB for now.   Other Orders: EKG w/ Interpretation (93000)  Patient Instructions: 1)  Stop Spironolactone 2)  Your physician wants you to follow-up in:  1 year.  You will receive a reminder letter in the mail two months in advance. If you don't receive a letter, please call our office to schedule the follow-up appointment.

## 2010-12-13 NOTE — Assessment & Plan Note (Signed)
Summary: f1y   CC:  no complaints.  History of Present Illness:  Jason Floyd is a very pleasant 45 year old male with the history of severe congestive heart failure secondary to nonischemic cardiomyopathy with biventricular dysfunction in the setting of previous polysubstance abuse.  His ejection fraction has returned totally to normal. Also has a h/o obesity and gout.  He returns to do routine followup. No CP or SOB. Is staying pretty active with his dogs. Trying to do a bit of running but struggling with plantar fasciitis. Frequent dizziness particularly when bending over.      Current Medications (verified): 1)  Spironolactone 25 Mg  Tabs (Spironolactone) .... Take 1 Tablet By Mouth Once A Day 2)  Diovan 160 Mg  Tabs (Valsartan) .... Take 1 Tablet By Mouth Once A Day 3)  Allopurinol 100 Mg  Tabs (Allopurinol) .... Take 1 Tablet By Mouth Once A Day 4)  Coreg 25 Mg  Tabs (Carvedilol) .... Take 1 Tablet By Mouth Two Times A Day 5)  Triamcinolone Acetonide 0.5 %  Crea (Triamcinolone Acetonide) .... Apply Two Times A Day 6)  Zolpidem Tartrate 5 Mg  Tabs (Zolpidem Tartrate) .Marland Kitchen.. 1 By Mouth At Night As Needed Not To Exceed 10 Per Month 7)  Meloxicam 15 Mg Tabs (Meloxicam) .... One By Mouth Once Daily--Needs Office Visit For Additional Refills  Allergies (verified): No Known Drug Allergies  Past History:  Past Medical History: PULMONARY EMBOLISM, HX OF (ICD-V12.51) NEPHROLITHIASIS, HX OF (ICD-V13.01) DRUG ABUSE, HX OF (ICD-V15.89) GROIN PAIN (ICD-789.09) WRIST PAIN, RIGHT (ICD-719.43) NUMMULAR ECZEMA (ICD-692.9) GOUT (ICD-274.9) CONGESTIVE HEART FAILURE (ICD-428.0)   --resolved  Review of Systems       As per HPI and past medical history; otherwise all systems negative.   Vital Signs:  Patient profile:   45 year old male Height:      74 inches Weight:      272 pounds BMI:     35.05 Pulse rate:   77 / minute BP sitting:   116 / 80  (right arm) Cuff size:   large  Vitals  Entered By: Hardin Negus, RMA (October 20, 2009 3:16 PM)  Physical Exam  General:  Gen: well appearing. no resp difficulty HEENT: normal Neck: supple. no JVD. Carotids 2+ bilat; no bruits. No lymphadenopathy or thryomegaly appreciated. Cor: PMI nondisplaced. Regular rate & rhythm. No rubs, gallops, murmur. Lungs: clear Abdomen: soft, nontender, nondistended. No hepatosplenomegaly. No bruits or masses. Good bowel sounds. Extremities: no cyanosis, clubbing, rash, edema Neuro: alert & orientedx3, cranial nerves grossly intact. moves all 4 extremities w/o difficulty. affect pleasant    Impression & Recommendations:  Problem # 1:  CONGESTIVE HEART FAILURE (ICD-428.0) EF has normalzied. Has been doing great. Given dizziness will cut carvedilol to 12.5 two times a day. Keep other meds the same.   Orders: EKG w/ Interpretation (93000)  Patient Instructions: 1)  Decrease Carvedilol to 12.5mg  two times a day  2)  Follow up in 1 year

## 2010-12-13 NOTE — Assessment & Plan Note (Signed)
Summary: gout/LC   Vital Signs:  Patient profile:   45 year old male Temp:     97.8 degrees F oral BP sitting:   100 / 70  (left arm) Cuff size:   regular  Vitals Entered By: Sid Falcon LPN (Mar 29, 2009 12:09 PM) CC: bil foot pain, history of gout, head cold   History of Present Illness: Patient seen with bilateral foot pain. History of gout but this pain is somewhat different. Difficult to localize. Most of his pain is on the plantar aspect of the feet. No recent change of shoe wear. 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 He has not noted any recent erythema or edema. He takes allopurinol 200 mg daily for gout suppression. No recent injury. No recent nonsteroidals. Patient also complains of cold-like symptoms with nasal congestion dry cough and mild sore throat past few days. No fever.  Patient relates prior history of congestive heart failure apparently due to myocarditis. He states he had full recovery.  Allergies: No Known Drug Allergies  Past History:  Past Medical History:    PULMONARY EMBOLISM, HX OF (ICD-V12.51)    NEPHROLITHIASIS, HX OF (ICD-V13.01)    DRUG ABUSE, HX OF (ICD-V15.89)    GROIN PAIN (ICD-789.09)    WRIST PAIN, RIGHT (ICD-719.43)    NUMMULAR ECZEMA (ICD-692.9)    GOUT (ICD-274.9)    CONGESTIVE HEART FAILURE (ICD-428.0)      (11/18/2008)  Social History:    hx of alcohol and tobacco abuse, polysubstance abuse    Married    1 adopted child    Regular exercise-no     (03/22/2008)  Review of Systems      See HPI  Physical Exam  General:  Well-developed,well-nourished,in no acute distress; alert,appropriate and cooperative throughout examination Head:  Normocephalic and atraumatic without obvious abnormalities. No apparent alopecia or balding. Ears:  External ear exam shows no significant lesions or deformities.  Otoscopic examination reveals clear canals, tympanic membranes are intact bilaterally  without bulging, retraction, inflammation or discharge. Hearing is grossly normal bilaterally. Nose:  External nasal examination shows no deformity or inflammation. Nasal mucosa are pink and moist without lesions or exudates. Mouth:  Oral mucosa and oropharynx without lesions or exudates.  Teeth in good repair. Neck:  No deformities, masses, or tenderness noted. Lungs:  Normal respiratory effort, chest expands symmetrically. Lungs are clear to auscultation, no crackles or wheezes. Extremities:  No visible erythema or warmth.  Sl tender plantar fascia bilaterally.  no achilles tenderness.   Impression & Recommendations:  Problem # 1:  FASCIITIS, PLANTAR (ICD-728.71) He'll try a brief period of icing and reviewed stretches. Try short-term use of meloxicam 15 mg daily. Consider orthotics if symptoms persist.  No evidence for gout flare. His updated medication list for this problem includes:    Meloxicam 15 Mg Tabs (Meloxicam) ..... One by mouth once daily  Complete Medication List: 1)  Spironolactone 25 Mg Tabs (Spironolactone) .... Take 1 tablet by mouth once a day 2)  Lasix 40 Mg Tabs (Furosemide) .... Take 1 tablet by mouth once a day as needed 3)  Diovan 160 Mg Tabs (Valsartan) .... Take 1 tablet by mouth once a day 4)  Allopurinol 100 Mg Tabs (Allopurinol) .... Take 1 tablet by mouth once a day 5)  Coreg 25 Mg Tabs (Carvedilol) .... Take 1 tablet by mouth two times a day 6)  Triamcinolone Acetonide 0.5 % Crea (Triamcinolone acetonide) .... Apply two times  a day 7)  Zolpidem Tartrate 5 Mg Tabs (Zolpidem tartrate) .Marland Kitchen.. 1 by mouth at night as needed not to exceed 10 per month 8)  Coreg 6.25 Mg Tabs (Carvedilol) 9)  Warfarin Sodium 7.5 Mg Tabs (Warfarin sodium) .... Take 1 tablet once a day 10)  Meloxicam 15 Mg Tabs (Meloxicam) .... One by mouth once daily  Patient Instructions: 1)  Do regular stretches as instructed. Touch base in a few weeks if symptoms not improving. Prescriptions:  MELOXICAM 15 MG TABS (MELOXICAM) one by mouth once daily  #30 x 0   Entered and Authorized by:   Evelena Peat MD   Signed by:   Evelena Peat MD on 03/29/2009   Method used:   Electronically to        [facility] (retail)       [address]       [place], [place]  [address]       Ph: [phone]       Fax: [phone]   RxID:   [id number]

## 2010-12-13 NOTE — Assessment & Plan Note (Signed)
Summary: ? hernia/ccm   Vital Signs:  Patient Profile:   45 Years Old Male Weight:      275 pounds Temp:     98.7 degrees F Pulse rate:   80 / minute BP sitting:   128 / 62  (left arm)  Vitals Entered By: Gladis Riffle, RN (May 25, 2008 4:07 PM)                 Chief Complaint:  c/o pain right groin x 3 weeks--states has had daily with varying intensity.  History of Present Illness: R groin discomfort---ache, can have some testicular discomfort (rare). no fever or chills. no unusual activity  in addition he complains of r wrist discomfort---flexor aspect for 6 months. tried otc ibuprofen--no relief. also no relief with ice.     Prior Medications Reviewed Using: Patient Recall  Prior Medication List:  SPIRONOLACTONE 25 MG  TABS (SPIRONOLACTONE) Take 1 tablet by mouth once a day LASIX 40 MG  TABS (FUROSEMIDE) Take 1 tablet by mouth once a day as needed DIOVAN 160 MG  TABS (VALSARTAN) Take 1 tablet by mouth once a day ALLOPURINOL 100 MG  TABS (ALLOPURINOL) Take 1 tablet by mouth once a day COREG 25 MG  TABS (CARVEDILOL) Take 1 tablet by mouth two times a day TRIAMCINOLONE ACETONIDE 0.5 %  CREA (TRIAMCINOLONE ACETONIDE) apply two times a day ZOLPIDEM TARTRATE 5 MG  TABS (ZOLPIDEM TARTRATE) 1 by mouth at night as needed   Current Allergies (reviewed today): No known allergies   Past Medical History:    Reviewed history from 03/22/2008 and no changes required:       Congestive heart failure       Gout       tobacco abuse       PE---secondary to CHF---6 months of warfarin CHF has resolved.   Past Surgical History:    Reviewed history from 03/22/2008 and no changes required:       wisdom tooth extraction   Family History:    Reviewed history from 03/22/2008 and no changes required:       father---helathy       mother---alive and well   Social History:    Reviewed history from 03/22/2008 and no changes required:       hx of alcohol and tobacco abuse, polysubstance  abuse       Married       1 adopted child       Regular exercise-no    Review of Systems       no other complaints in a complete ROS    Physical Exam  General:     Well-developed,well-nourished,in no acute distress; alert,appropriate and cooperative throughout examination Head:     Normocephalic and atraumatic without obvious abnormalities. No apparent alopecia or balding. Abdomen:     Bowel sounds positive,abdomen soft and non-tender without masses, organomegaly or hernias noted.no abdominal hernia and no inguinal hernia.   Msk:     No deformity or scoliosis noted of thoracic or lumbar spine.    FROM both hips Extremities:     No clubbing, cyanosis, edema, or deformity noted  Neurologic:     cranial nerves II-XII intact and gait normal.   Skin:     Intact without suspicious lesions or rashes    Impression & Recommendations:  Problem # 1:  WRIST PAIN, RIGHT (ICD-719.43) wrist pain Orders: Orthopedic Referral (Ortho)   Problem # 2:  GROIN PAIN (ICD-789.09) unclear etiology likely soft tissue  inflammation/strain (contributed to by morbid obesity) will resolve spontaneously  Complete Medication List: 1)  Spironolactone 25 Mg Tabs (Spironolactone) .... Take 1 tablet by mouth once a day 2)  Lasix 40 Mg Tabs (Furosemide) .... Take 1 tablet by mouth once a day as needed 3)  Diovan 160 Mg Tabs (Valsartan) .... Take 1 tablet by mouth once a day 4)  Allopurinol 100 Mg Tabs (Allopurinol) .... Take 1 tablet by mouth once a day 5)  Coreg 25 Mg Tabs (Carvedilol) .... Take 1 tablet by mouth two times a day 6)  Triamcinolone Acetonide 0.5 % Crea (Triamcinolone acetonide) .... Apply two times a day 7)  Zolpidem Tartrate 5 Mg Tabs (Zolpidem tartrate) .Marland Kitchen.. 1 by mouth at night as needed    ]

## 2010-12-14 ENCOUNTER — Other Ambulatory Visit: Payer: Self-pay | Admitting: Internal Medicine

## 2010-12-14 NOTE — Progress Notes (Signed)
Summary: resend refill request  Phone Note Refill Request Message from:  Patient on [facility]  Refills Requested: Medication #1:  CARVEDILOL 12.5 MG TABS Take one tablet by mouth twice a day 90 days supply/refills   Method Requested: Telephone to Pharmacy Initial call taken by: Glynda Jaeger,  November 01, 2010 2:54 PM    Prescriptions: CARVEDILOL 12.5 MG TABS (CARVEDILOL) Take one tablet by mouth twice a day  #90 Tablet x 3   Entered by:   Hardin Negus, RMA   Authorized by:   Dolores Patty, MD, [facility]   Signed by:   Hardin Negus, RMA on 11/02/2010   Method used:   Electronically to        [facility] (retail)       [address]       [place], [place]  [address]       Ph: [phone]       Fax: [phone]   RxID:   [id number]

## 2011-01-02 ENCOUNTER — Other Ambulatory Visit: Payer: Self-pay | Admitting: Internal Medicine

## 2011-01-18 ENCOUNTER — Other Ambulatory Visit: Payer: Self-pay | Admitting: Internal Medicine

## 2011-02-27 ENCOUNTER — Other Ambulatory Visit: Payer: Self-pay | Admitting: Internal Medicine

## 2011-03-27 NOTE — Assessment & Plan Note (Signed)
[facility]                            CARDIOLOGY OFFICE NOTE   CARVER, MURAKAMI                     MRN:          [id number]  DATE:08/07/2007                            DOB:          10-Aug-1966    INTERVAL HISTORY:  [name] is a very pleasant 45 year old male with  a history of congestive heart failure secondary to severe nonischemic  biventricular heart failure in the setting of previous polysubstance  abuse.  He returns today for routine followup.   Overall, he is doing pretty well.  He did have what sounds to be a viral  syndrome last week with high fevers, diarrhea, and a cough.  This has  gotten better.  He is back to his usual activities.  He is mowing the  grass.  He is clearing brush and doing other things without any  limitations.  He denies any orthopnea or PND.  No lower extremity edema.  He did say when we increased his nighttime dose of Coreg he did feel a  little funny for a few days, but now feels better.  He has been  abstinent from any alcohol or drugs.   CURRENT MEDICATIONS:  1. Spironolactone 25.  2. Potassium 20 a day.  3. Diovan 80 a day.  4. Warfarin.  5. Allopurinol 100 a day.  6. Magnesium oxide.  7. Lasix 40 b.i.d.  8. Digoxin 0.25 a day.  9. Valsartan 80 a day.  10.Coreg 9.375 in the morning, and 12.5 at night.   PHYSICAL EXAMINATION:  GENERAL:  He is well appearing, in no acute  distress, ambulates around the clinic without any respiratory  difficulty.  VITAL SIGNS:  Blood pressure is 97/62, heart rate is 84, weight is 232.  HEENT:  Normal.  NECK:  Supple.  No JVD.  Carotids are 2+ bilaterally, with no bruits.  There is no lymphadenopathy or thyromegaly.  CARDIAC:  PMI is mildly laterally displaced.  There is no rub or gallop,  no murmur.  LUNGS:  Clear.  ABDOMEN:  Soft, nontender, nondistended.  No hepatosplenomegaly, no  bruits, no masses.  Good bowel sounds.  EXTREMITIES:  Warm, with no cyanosis,  clubbing, or edema.  NEUROLOGIC:  He is alert and oriented x3.  Cranial nerves II-XII are  intact.  Moves all four extremities without difficulty.  Affect is  pleasant.   ASSESSMENT AND PLAN:  Congestive heart failure.  We did a quick look  echocardiogram the last time he was here, and ejection fraction seems to  be about 35%, which is much improved.  We will get a formal  echocardiogram in the next week or two.  I will go ahead and titrate up  his  carvedilol to 12.5 b.i.d., and I will see him back in 2 weeks.  Hopefully, at that point we can also titrate up his valsartan to 120 a  day.  Most recent BNP is 95, which looks quite good.     Bevelyn Buckles. Bensimhon, MD  Electronically Signed    DRB/MedQ  DD: 08/07/2007  DT: 08/08/2007  Job #: [id number]   cc:  Versie Starks, M.D.

## 2011-03-27 NOTE — Consult Note (Signed)
NAMECOLEY, KULIKOWSKI NO.:  [id number]   MEDICAL RECORD NO.:  [id number]          PATIENT TYPE:  INP   LOCATION:  2906                         FACILITY:  [facility]   PHYSICIAN:  Francisca December, M.D.  DATE OF BIRTH:  05-Jun-1966   DATE OF CONSULTATION:  04/29/2007  DATE OF DISCHARGE:                                 CONSULTATION   REASON FOR CONSULTATION:  Apparent severe cardiomyopathy.   HISTORY OF PRESENT ILLNESS:  Jason Floyd is a 45 year old man who was  admitted on April 26, 2007, with ascites.  He has had progressive dyspnea  for the past 6 months and now presented with abdominal distension and  discomfort in the abdomen.  He denies any chest pain, palpitation,  dizziness or fatigue.  No syncope.  He has a significant history for  polysubstance abuse including ethanol predominately but also history of  marijuana and cocaine use rarely.  By my history, he admits to 1/2  gallon of bourbon every other day up until 5 years ago.  He drank  heavily for 5 years.  Only socially for the past 5 years of about once a  month or so.   His initial evaluation was focused on the abdomen after the finding of  significant ascites on CT scan and ultrasound.  He underwent  paracentesis of 600 mL on April 27, 2007.  He has had a portal vein  Doppler study which showed no obstruction.  Finally, on April 28, 2007, a  2-D echocardiogram was obtained and found to show an ejection fraction  of 10-20% with diffuse hypokinesis and mild increased LV wall thickness,  left atrium dilated, right ventricle dilated with decreased systolic  function, right atrial enlargement, and small pericardial effusion.  The  chest x-ray shows cardiomegaly with vague opacities in both bases.   At the time we were consulted, his dyspnea was becoming worse, and  hypotension was setting in.  He was requiring increased FIO2 to maintain  O2 saturation greater than 90%.   PAST MEDICAL HISTORY:  1. Polysubstance  abuse.  2. Hyperlipidemia.  3. Seasonal allergies.  4. Gout.  5. Nephrolithiasis.   OUTPATIENT MEDICATIONS:  Allopurinol and Lipitor as well as Zyrtec.   HOSPITAL MEDICATIONS:  1. Zyloprim 200 mg p.o. daily.  2. Coreg 3.125 mg p.o. b.i.d., currently on hold.  3. Rocephin 1 gram q.24 h.  4. Furosemide 40 mg IV q. 12 h.  5. Lactulose 30 mL 3 times a day, currently on hold.  6. Aldactone 25 mg p.o. daily.   FAMILY HISTORY:  Both grandfathers died of MI in their 25s.  No family  history of sudden death.  Father is alive at age 54, has gout.  Mother  alive and well at age 84.   SOCIAL HISTORY:  He is married, accompanied by his wife here in the  emergency room.  There is one adopted child.  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  Smokes and uses alcohol as mentioned above.   REVIEW OF SYSTEMS:  Denies any jaundice.  Appears to not have any  decrease in  his appetite.  Had slow weight gain over the last 6-9  months, uncertain how much.  He has not had any diarrhea or constipation  prior to admission.  He did have extensive diarrhea the night before our  evaluation after being given the lactulose.  He denies any muscle aches  or major joint discomfort.  He has not had any neurologic disorders to  his knowledge.  No urinary tract difficulties.   PHYSICAL EXAMINATION:  VITAL SIGNS:  Blood pressures 82/70. The pulse is  100 and regular. Respiratory rate is 28. Temperature is afebrile.  GENERAL:  He is a 45 year old mildly obese Caucasian male in moderate  respiratory distress.  HEENT:  Unremarkable.  Head is atraumatic, normocephalic.  Pupils equal,  round, and reactive to light.  Extraocular motion intact.  Sclerae are  anicteric.  Oral mucosa is pink and dry.  The tongue is not coated.  NECK:  Supple without thyromegaly or masses.  The carotid upstrokes are  normal.  There is no bruit.  It did not see JVD at 30 degrees  inclination.  CHEST:  Diminished breath sounds in both bases with soft  rales  bilaterally.  HEART:  Regular rhythm, slightly tachycardic.  There is an S3 gallop  present.  ABDOMEN:  Obese, somewhat tense, positive fluid wave, right upper  quadrant tenderness.  EXTREMITIES:  Show full range of motion and minimal edema.  NEUROLOGIC:  Cranial nerves II-XII intact.  Motor and sensory grossly  intact.  Gait not tested.  SKIN:  Pale and moist.   LABORATORY ASSESSMENT:  Paracentesis is transudative and negative for  organisms thus far.   Direct bilirubin is 1.0.  Sodium is 129. Creatinine is 1.95, potassium  5.0, BUN 23, glucose 161, alkaline phosphatase normal.  AST and ALT are  increasing and elevated. Ammonia level was 34.  Hemoglobin and  hematocrit are normal.  White blood cell count is 15,000. INR is 1.4.  BNP is 13 51.  Hepatitis panel is negative.  Urine cultures negative.  CEA 1.7.   Chest x-ray shows cardiomegaly, question bronchitic changes, slight  distension of the azygos vein consistent with elevated right heart  pressure.   A 2-D echocardiogram as above.   ASSESSMENT:  1. 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  2. Relative hypotension and hypoxemia consistent with early      cardiogenic shock.  3. Ascites, either secondary to primary liver disorder or secondary to      his right heart failure.  4. Elevated liver function tests as noted.  5. Acute systolic heart failure with symptoms of both right and left      heart failure.  6. History of polysubstance abuse.  7. Hyponatremia secondary likely to his heart failure. also has      received some diuretics.   PLAN AND RECOMMENDATIONS:  1. Would hold on the Coreg and the Aldactone at this time, also      discontinue lactulose  2. Increase furosemide to 60 mg IV q. 12 h.  3. Transferred to ICU setting.  4. Begin dopamine for pressure support not to exceed 10 mcg/kg per      minute.  5. Non-rebreather mask for her O2 saturation maintained greater than      90%  6. Daily BMET and BNP.  7. Consider adding dobutamine and decrease dopamine if responds within  the next 12-18 hours.  8. Check daily chest x-ray.  9. Proceed with ongoing GI evaluation for possible liver disease.   Thank you very much for allowing me to assist the care [name].  It has been a pleasure to do so.  Will discuss his further  care with you.      Francisca December, M.D.  Electronically Signed     JHE/MEDQ  D:  04/30/2007  T:  04/30/2007  Job:  [id number]   cc:   L. Lupe Carney, M.D.  Graylin Shiver, M.D.

## 2011-03-27 NOTE — Consult Note (Signed)
NAME:  Jason Floyd, Jason Floyd NO.:  [id number]   MEDICAL RECORD NO.:  [id number]          PATIENT TYPE:  INP   LOCATION:  5736                         FACILITY:  [facility]   PHYSICIAN:  Graylin Shiver, M.D.   DATE OF BIRTH:  May 27, 1966   DATE OF CONSULTATION:  DATE OF DISCHARGE:                                 CONSULTATION   We were asked to see Jason Floyd today in consult for ascites by Dr.  Doristine Counter.   HISTORY OF PRESENT ILLNESS:  This is a 45 year old male with a very  little past medical history.  He states he has gained approximately 7  pounds in the past 3 years.  He has been short of breath on exertion for  5-6 months.  He has had a recent illness in which he felt for about 2  weeks felt very feverish, started talking out of his mind, and having  occasional mental hallucinations, complaining of abdominal distension  growing greater over the last 5-6 months,  complaining of abdominal pain  in the right upper quadrant as well as the lower hypogastric area.  Positive for cough x2 weeks, constipation x1 week and hematuria this  week.  He has a tattoo.  He denies any IV drug use or any previous  history of blood transfusions.   CURRENT MEDICATIONS:  Lipitor and allopurinol.   ALLERGIES:  He has no known drug allergies.   PAST MEDICAL HISTORY:  Significant for removal of his wisdom teeth, no  other surgeries.  Gout, high cholesterol mononucleosis and pneumonia.   REVIEW OF SYSTEMS:  He describes lower back pain.   SOCIAL HISTORY:  He is recently divorced and remarried, moved into a new  home, started a new job.  Says that he used to drink heavily but now  drinks alcohol only socially.  He describes this as three Margaritas  on  Saturday evening, more if he is going to a race.  He is positive for  tobacco.  He says he smokes 3 or 5 cigarettes a day, more on the  weekends, positive for recreational drug use, says that he will  occasionally use marijuana and  cocaine  when playing cards with the  guys.   FAMILY HISTORY:  Negative for liver disease, pancreatic, stomach or  colon cancer.   PHYSICAL EXAM:  He is alert and oriented in no apparent distress.  He  has a great sense of humor.  His face is flushed.  His breathing is  audible.  He has no jaundice or icterus.  HEART:  Has a regular rate and rhythm.  LUNGS:  Clear to auscultation bilaterally.  ABDOMEN:  is very distended.  It is firm but not rigid, positive fluid  shift, decreased bowel sounds.  No appreciable organomegaly.  EXTREMITIES show no edema.  He has no evidence of asterixis or spider  angioma.  NEURO:  At the moment his mentation is clear.  VITALS:  Temperature is 98, pulse of 106, respirations are 18, blood  pressure is 102/78.   LABS:  Show a hemoglobin of 14.0.  White count 15.2,  hematocrit 42,  platelets 375, PTT is 29.  PT is 17.3.  Transaminases: AST 66, ALT 93,  alk phos 68, total bilirubin 1.6.  Chem-7 shows sodium 133, potassium  4.3, chloride 98, bicarb 23, BUN 22, creatinine 1.57, glucose 129.  BNP  1351.  Paracentetic fluid cultures showed abundant WBCs, albumin 2.5,  protein 3.7 and amylase 7.  His hepatitis C was negative.  Hepatitis B  surface antigen was negative.  Hepatitis B core antigen was negative.  Hepatitis A was negative, alpha-fetoprotein was 4.2 within normal  limits.  His CEA was within normal limits.  Blood cultures are pending.  Urine cultures were pending.  X-ray done yesterday showed a right 7.5 mm  ureteral calculus.  On CT scan done 06/13:  1. Moderate ascites that extends into the pelvis.  2. Scattered hepatic and celiac nodes.  3. Pleural effusion.   Dr. Wandalee Ferdinand has seen and examined the patient.  He states that  1. This is a mixed picture, young 45 year old male with congestive      heart failure, abdominal ascites with increased transaminases,      hepatic encephalopathy, a recent acute  infection, bronchitis      versus  pneumonia.  2. Constipation.   PLAN:  Will order lactulose for now and await the Doppler ultrasound  results to evaluate for hepatic vein thrombosis.  Thank you very much  for this consultation.  We will follow the patient with you.      Stephani Police, PA    ______________________________  Graylin Shiver, M.D.    MLY/MEDQ  D:  04/28/2007  T:  04/28/2007  Job:  [id number]   cc:   Jason Shad. Rudean Curt, MD

## 2011-03-27 NOTE — Assessment & Plan Note (Signed)
[facility]                            CARDIOLOGY OFFICE NOTE   KESLEY, GAFFEY                     MRN:          [id number]  DATE:06/09/2007                            DOB:          1965-11-23    REFERRING PHYSICIAN:  Versie Starks, MD   HISTORY OF PRESENT ILLNESS:  [name] is a very pleasant 45 year old  male with a history of polysubstance abuse including tobacco, cocaine,  and very significant alcohol intake.  He presented to [facility] on April 26, 2007 with profound heart failure and cardiogenic  shock.  He was evaluated by [facility], and eventually transferred  to [facility].  At [facility], he was found to have an EF of 10% to 20% with  significant biventricular failure.  He underwent cardiac MRI which  showed a nonischemic pattern.  There was also evidence of densities in  the right atrium thought to be thrombi, and also pulmonary nodule.  Pulmonary infiltrates were thought to be pulmonary embolus.  His  medications were titrated, and he was discharged approximately 2 weeks  ago.  He did follow up with Dr. Aundria Rud in clinic last week, and his  Lasix was decreased due to hypotension.  He presents today to establish  long term care.   Overall, he says he is doing much, much better.  He has not had any  shortness of breath, he is able to go up and down 16 steps at home on a  routine basis without any difficulty.  He denies any chest pain, no  shortness of breath, no lower extremity edema.  He has lost 50 pounds in  weight since June.  He does watch his salt intake very closely and has  been watching his weight, using a sliding scale of Lasix and  supplementing with metolazone as needed.  He has now taken to riding his  bike with his wife at 1 mile a day, and is considering swimming.   Fortunately, he has been clean and not been using any alcohol,  cigarettes or drugs.  I have congratulated him on this.   REVIEW OF SYSTEMS:  All  systems negative except for HPI and problem  list.   PROBLEM LIST:  1. Congestive heart failure secondary to severe nonischemic      cardiomyopathy with biventricular failure.      a.     Ejection fraction of approximately 15%.  Diagnosed in June       of 2008.      b.     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  2. Gout.  3. Nephrolithiasis.  4. Pulmonary emboli.  5. Seasonal allergies.   CURRENT MEDICATIONS:  1. Spironolactone 25 a day.  2. Potassium 20 a day.  3. Diovan 80 a day.  4. Coreg 3.125 b.i.d.  5. Lasix 80 q.12.  6. Warfarin 7.5 a day.  7. Allopurinol 100 a day.  8. Magnesium 800 a day.   SOCIAL HISTORY:  He is married with 1 child.  He lives in [place].  He works at [employer] as a Quarry manager.   FAMILY HISTORY:  Denies any known history of congestive heart failure or  coronary artery disease in any first degree relatives.   PHYSICAL EXAMINATION:  He is well appearing in no acute distress.  He  ambulates around the clinic without any respiratory difficulty.  I was unable to obtain a blood pressure with a stethoscope, by Doppler  it is 74/palp.  His heart rate is 100.  His weight is 216.  HEENT:  Normal.  NECK:  Supple.  There is no JVD.  Carotids are 2+ bilaterally without  bruits.  There is no lymphadenopathy or thyromegaly.  CARDIAC:  PMI is laterally displaced.  He is tachycardic and regular.  There is a soft S3.  LUNGS:  Clear.  ABDOMEN:  Soft, nontender, nondistended.  There is no  hepatosplenomegaly, no bruits, no masses.  Good bowel sounds.  EXTREMITIES:  Clammy.  There is no cyanosis, clubbing, or edema.  No  rash.  NEURO:  Alert and oriented x3.  Cranial nerves II-XII are intact.  Moves  all 4 extremities without difficulty.  Affect is very pleasant.   EKG shows sinus tachycardia with biatrial enlargement and left anterior  fascicular block.  There are mild, diffuse, nonspecific ST-T wave   abnormalities.   ASSESSMENT AND PLAN:  Severe congestive heart failure secondary to  nonischemic cardiomyopathy and biventricular failure.  From a functional  standpoint, he appears to be doing much better.  However, on physical  exam, he still has evidence of low output with hypotension and S3, and  clamminess.  At this point, I have asked him to back off a bit on his  Lasix to 40 mg b.i.d. to let his dry weight drift up to about 212 or 213  to see if we can get a little more blood pressure to work with.  We have  also gone ahead and added digoxin 0.25 mg a day.  Will follow him very  closely in the heart failure clinic, and titrate his medicines as his  blood pressure tolerates.  I had a long discussion with his wife and him  about the natural course of heart failure, and the possibility of  recovery.  I reminded him that he needs to be totally abstinent from  alcohol and other substances.  I congratulated him on what he has done  already.   We have also discussed the possibility of a defibrillator should his  ejection fraction not improve over the next several months.  We also  briefly touched upon advanced therapies, which he was apprised of at  [facility].   DISPOSITION:  We will see him back in 1 week for followup.     Bevelyn Buckles. Bensimhon, MD  Electronically Signed    DRB/MedQ  DD: 06/09/2007  DT: 06/10/2007  Job #: [id number]   cc:   Jamesetta Orleans, MD

## 2011-03-27 NOTE — Assessment & Plan Note (Signed)
[facility]                            CARDIOLOGY OFFICE NOTE   ABELARDO, SEIDNER                     MRN:          [id number]  DATE:10/28/2007                            DOB:          15-Aug-1966    INTERVAL HISTORY:  Deshawn is a very pleasant 46 year old male with a  history of congestive heart failure, secondary to non-ischemic  cardiomyopathy, with biventricular dysfunction in the setting of  previous polysubstance abuse.  He returns today for routine followup.   Overall, he is doing great.  He is back to work and is not having any  chest pain or dyspnea and no lower extremity edema, no orthopnea, no  PND, no palpitations.   He has had an echocardiogram today, which I have reviewed personally.  The official read said an ejection fraction of 50%.  However, I think  his LV function has returned completely to normal with an EF of at least  55%.   CURRENT MEDICATIONS:  1. Carvedilol 18.75 b.i.d.  2. Spironolactone 25 a day.  3. Potassium 20 mEq a day.  4. Allopurinol 100 a day.  5. Magnesium 800 a day.  6. Lasix 40 a day.  7. Diovan 160 a day.   PHYSICAL EXAM:  He is well-appearing, in no acute distress.  He  ambulates around the clinic without any respiratory difficulty.  Blood pressure is 112/76, heart rate 72, weight is 256.  HEENT:  Normal.  NECK:  Supple, no JVD.  Carotids are 2+ bilaterally without any bruits.  There is no lymphadenopathy or thyromegaly.  CARDIAC:  PMI is nondisplaced, regular rate and rhythm, no S3, no  murmur.  LUNGS:  Clear.  ABDOMEN:  Obese, soft, nontender, nondistended.  No hepatosplenomegaly,  no bruits, no masses, good bowel sounds.  EXTREMITIES:  Warm with no cyanosis, clubbing or edema, no rash.  NEUROLOGIC:  He is alert and oriented times three.  Cranial nerves II  through XII are intact.  He moves all four extremities without  difficulty.  Affect is pleasant.   ASSESSMENT AND PLAN:  Congestive heart  failure, secondary to non-  ischemic cardiomyopathy.  He is currently NYHA Class 1 with recovered  ejection fraction.  We will increase his Coreg to 25 b.i.d. for his  final titration.  Once again, I reminded him to be totally  abstinent from any substance abuse.  We will see him back in six to  eight weeks for routine followup.     Bevelyn Buckles. Bensimhon, MD  Electronically Signed    DRB/MedQ  DD: 10/28/2007  DT: 10/29/2007  Job #: [id number]

## 2011-03-27 NOTE — Assessment & Plan Note (Signed)
[facility]                            CARDIOLOGY OFFICE NOTE   RIELY, BASKETT                     MRN:          [id number]  DATE:09/30/2007                            DOB:          Nov 10, 1966    INTERVAL HISTORY:  Jason Floyd is a very pleasant 45 year old male with  history of congestive heart failure secondary to nonischemic  cardiomyopathy with biventricular dysfunction in the setting of previous  polysubstance abuse.  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  We have been following him closely.  His most recent  echocardiogram showed an ejection fraction of 30% to 45%, which was much  improved.  His ventricle has also gotten smaller.   He returns today for routine followup.  He is doing very well.  He  denies any dyspnea.  No lower extremity edema.  No orthopnea or PND.  Unfortunately, he has been quite sedentary, and has been gaining weight.  He is up about 30 pounds.  He has been eating a lot.  He also has  started to have an occasional alcoholic drink.   CURRENT MEDICATIONS:  1. Carvedilol 12.5 b.i.d.  2. Spironolactone 25 a day.  3. Potassium 20 mEq a day.  4. Coumadin.  5. Allopurinol 100 a day.  6. Magnesium.  7. Lasix 40 b.i.d.  8. Digoxin 0.25 a day.  9. Diovan 160 a day.   PHYSICAL EXAMINATION:  He is well appearing, in no acute distress.  Ambulates around the clinic without any respiratory difficulty.  Blood  pressure is 100/62.  Heart rate is 80.  Weight is 250.  HEENT:  Normal.  NECK:  Supple.  There is no JVD.  Carotids are 2+ bilaterally without  any bruits.  There is no lymphadenopathy or thyromegaly.  CARDIAC:  His PMI is mildly displaced laterally.  He has a regular rate  and rhythm.  No S3.  no murmur.  LUNGS:  Clear.  ABDOMEN:  Obese, soft, nontender, non-distended.  No hepatosplenomegaly.  No bruits.  No masses.  Good bowel sounds.  EXTREMITIES:  Warm with no cyanosis, clubbing, or  edema.  No rash.  NEUROLOGIC:  He is alert and oriented x3.  Cranial nerves 2 through 12  are intact.  Moves all 4 extremities without difficulty.  Affect is  pleasant.   ASSESSMENT AND PLAN:  1. 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      Currently New [place] Heart Association Class I.  I have told him that      he can go ahead and stop his digoxin, as well as his Coumadin.  We      will continue to titrate his Coreg to 18.75 b.i.d.  Once he gets      stable on this dose, we can also decrease his Lasix to 40 once a      day, and supplement as needed.  I had a long talk with him and his      wife about the slippery slope of him starting to drink alcohol      again, and I  have recommended complete abstinence.  2. He will be returning to work part time for December, and then we      will see if we can get him back to full-time work in January.  We      will repeat his echocardiogram in the near future to reassess his      left ventricular function for complete recovery.     Bevelyn Buckles. Bensimhon, MD  Electronically Signed    DRB/MedQ  DD: 09/30/2007  DT: 10/01/2007  Job #: [id number]

## 2011-03-27 NOTE — Assessment & Plan Note (Signed)
[facility]                            CARDIOLOGY OFFICE NOTE   MEIKO, STRANAHAN                     MRN:          [id number]  DATE:06/18/2007                            DOB:          06/08/1966    ADDENDUM   PHYSICAL EXAMINATION:  GENERAL:  Well appearing, no acute distress. He  ambulates around the clinic without any respiratory difficulty.  VITAL SIGNS:  Blood pressure initially was 90/51, on manual test it was  85/60 in the right arm. Heart rate 103. His weight is 220 which is up  from 4 pounds from previously.  HEENT:  Normal.  NECK:  Supple. There is no JVD. Carotids are 2+ bilaterally without any  bruits. There is no lymphadenopathy or thyromegaly.  CARDIAC:  PMI is laterally displaced. He has a regular rate and rhythm  with perhaps a very faint S3. There is no murmur.  LUNGS:  Clear.  ABDOMEN:  Soft and nontender, nondistended. There is no  hepatosplenomegaly. No bruits. No masses. Good bowel sounds.  EXTREMITIES:  Warm with no cyanosis, clubbing, or edema. Good distal  pulses.  SKIN:  No rash.  NEUROLOGIC:  Awake, alert, and oriented x3. Cranial nerves II-XII are  intact. He can use all four extremities without difficulty. Affect is  pleasant.   ASSESSMENT AND PLAN:  Advanced congestive heart failure with an EF of  10% to 15% in the setting of polysubstance abuse. Although his blood  pressure remains somewhat low, he appears much better compensated today.  We will go ahead and try to increase his Coreg to 6.25 in the evening.  We will leave it at 3.125 in the morning for now. If he feels good after  five days, I told him that he could increase his morning dose. I did  warn him about possibly feeling worse with the titration of his beta  blocker and told him to use extra Lasix as needed. We will see him back  in two weeks for hopeful further titration of his medical regimen.     Bevelyn Buckles. Bensimhon, MD  Electronically  Signed    DRB/MedQ  DD: 06/18/2007  DT: 06/19/2007  Job #: [id number]

## 2011-03-27 NOTE — Assessment & Plan Note (Signed)
[facility]                            CARDIOLOGY OFFICE NOTE   HAMLET, LASECKI                     MRN:          [id number]  DATE:03/10/2008                            DOB:          09/30/1966    INTERVAL HISTORY:  Jason Floyd is a very pleasant 45 year old male with a  history of severe congestive heart failure secondary nonischemic  cardiomyopathy with biventricular dysfunction in the setting of previous  polysubstance abuse.  His EF has returned to normal.  He returns for  routine followup.   He has been doing great.  No chest pain or shortness of breath.  His  main problem is that since he stopped smoking he has gained a lot of  weight.  He is now up almost 50-60 pounds from his baseline.  He has  been compliant with all his medications.   CURRENT MEDICATIONS:  1. Spirolactone 25 a day.  2. Allopurinol 100 a day.  3. Lasix p.r.n.  4. Diovan 160 a day.  5. Coreg 25 b.i.d.   PHYSICAL EXAM:  He is well-appearing in no acute distress. Ambulates  around the clinic without respiratory difficulty.  Blood pressure is  104/72, heart rate 76, weight is 270 which is up 8 pounds.  HEENT is normal.  NECK:  Supple.  No obvious JVD.  Carotids are 2+ bilaterally without  bruits.  There is no lymphadenopathy or thyromegaly.  CARDIAC:  PMI is  not palpable.  Irregular rate and rhythm.  No murmurs, gallops or rubs.  LUNGS:  Clear.  ABDOMEN: Obese, nontender, not nondistended.  Good bowel sounds.  No  bruits.  EXTREMITIES:  Warm with cyanosis, clubbing or edema.  No rash.  NEURO:  Alert and oriented x3.  Cranial nerves II-XII are intact.  Moves  all four extremities without difficulty.  Affect is pleasant.   ASSESSMENT/PLAN:  Orris is doing great.  His EF has normalized.  Will  continue his current medications.  I did suggest that he undertake a  diet and exercise program in an effort to get his weight down some.   DISPOSITION:  Will see him for  routine followup in 6 months.  We have  referred him to Dr. Birdie Sons for a primary care physician.     Bevelyn Buckles. Bensimhon, MD  Electronically Signed    DRB/MedQ  DD: 03/10/2008  DT: 03/10/2008  Job #: [id number]   cc:   Valetta Mole. Swords, MD

## 2011-03-27 NOTE — Discharge Summary (Signed)
NAMEMOSIE, ANGUS NO.:  [id number]   MEDICAL RECORD NO.:  [id number]          PATIENT TYPE:  INP   LOCATION:  2903                         FACILITY:  [facility]   PHYSICIAN:  Francisca December, M.D.  DATE OF BIRTH:  12-03-1965   DATE OF ADMISSION:  04/26/2007  DATE OF DISCHARGE:  05/06/2007                               DISCHARGE SUMMARY   DISCHARGE DIAGNOSES:  1. Biventricular heart failure, class IV.  Congestive heart failure,      acute.  2. Idiopathic cardiomyopathy, with ejection fraction around 15%.  3. History of polysubstance abuse.  4. Hyperlipidemia.  5. Gout.  6. Nephrolithiasis.  7. Seasonal allergies.  8. Smoking cessation counseling.  9. Transient elevated liver function tests, with some question of      underlying liver disease, although this is felt to be more of a      heart failure liver function test trend.   HOSPITAL COURSE:  Mr. Sitar is a 45 year old male patient who was  admitted to [facility] on April 26, 2007 after a few days of  increasing abdominal girth.  CT of the abdomen and pelvis showed  abdominal ascites extending into the pelvis.  Ultimately, he did receive  a paracentesis, but to my knowledge has not shown any abnormalities.   In addition, the patient has had tests for AFB which were negative.  Blood cultures and respiratory cultures have been negative.   A 2D echocardiogram surprisingly showed an EF of about 10%-20%, with  global hypokinesis, mild ventricular wall thickness, bilateral atrial  enlargement.  The RV was moderately dilated, with decreased RV systolic  function.  There was a small pericardial effusion.   During the patient's hospitalization, his  respiratory status declined.  He did, however, not require intubation.  He essentially wanted to just  try to stay off the ventilator.  We did try BiPAP on him, but he could  not stand the feeling of having that device on, and has been doing well  with O2  saturations in the upper 90s while on the nonrebreather.   In our attempts to keep his blood pressure elevated as he went into  cardiogenic shock, we did place him on dobutamine and dopamine.  His  pressure was in the 90-100 range.  He did, however, remain tachycardic,  with a heart rate in the 110s.   He did have minimally elevated LFTs, but these gradually trended  downward once his heart failure was under better control.  He was  aggressively diuresed with IV diuretics.   He did have some leukocytosis and was empirically treated with  antibiotics.  He was given Decadron during the same time that he did  have the leukocytosis.  He had been having some chest discomfort, and it  is my feeling that the Decadron was given secondary to the chest  discomfort.   We did obtain an infectious disease consult because of the elevated  LFTs.  Dr. Lenn Sink of the infectious disease service felt that  the steroid dose did invoke this leukocytosis.   Fortunately, the patient did undergo  an adenosine Cardiolite.  This  showed no ischemia.  EF was reported at 20% at this time.  Since the  Cardiolite on May 02, 2007, the patient began to have more difficulty  with worsening pulmonary edema.  We felt that this cardiomyopathy was  not related to any type of ischemia, just based on the fact that his  Cardiolite was negative for ischemia.  Please note that the patient has  not had a cardiac catheterization.   The patient asked for a second opinion while in the hospital.  He did  receive one by Dr. Corliss Marcus' partner, Dr. Verdis Prime, who again  agreed that our approach was aggressive, but in the end we all decided  that the patient would need to be transferred to a tertiary center  because he was not improving.   At this time, I have contacted Dr. Earlene Plater of the cardiology service at  [facility], main phone number [phone].  The intensive care  number is [phone].  They have  accepted the patient, and I am  arranging final specifics for transfer.   DISCHARGE LABORATORIES:  BNP 1270.  Ammonia level 32.  Sodium 130,  potassium 3.7, BUN 20, creatinine 1.08, glucose 130.  Hemoglobin 14.1,  hematocrit 42.5, white count 23.9, platelets 306.  PT 15.6, INR 1.2, PTT  28.  LFTs with an AST of 265, ALT 325, alkaline phosphatase 87.  His  LFTs have trended down to an AST of 46, ALT 55, alkaline phosphatase  102.  Maximum troponin 0.09, with a CK of 82, and an MB fraction of 2.4.  BNP has trended over the past five days, with the following:  938, 1082,  1078, 1191, 1270.  Vitamin B12 340, ferritin 250, folate 5.5.  TSH  0.624.  CEA was 0.7.   Last chest x-ray was on May 05, 2007, that showed worsening aeration in  the right base, no change on the left.  The findings could be due to  increasing pulmonary edema.  Superimposed pneumonia is not excluded.  For this reason, the patient was placed on antibiotics.  An abdominal  ultrasound was performed as followup from his paracentesis, and this  showed no ascites.  Please note, again the patient did have an adenosine  Cardiolite that I performed.  There was no evidence of myocardial  ischemia.  There was marked LV enlargement, consistent with  cardiomyopathy, severe global hypokinesis, with no focal wall  abnormalities.  The EF was around 20%.  Please note, we will send all  these studies and notes with the patient to [facility].   The patient is discharged via [facility], ICU transport, to the hospital  in guarded condition.   Please note, the patient is on the following medications at this time:  1. Zyloprim 100 mg daily.  2. Insulin sliding scale.  3. Thiamine 1 tablet daily.  4. Folic acid 1 mg daily.  5. Multivitamin daily.  6. Protonix 40 mg p.o. daily.  7. Lantus insulin 5 units q.a.m. and q.p.m.  8. MiraLax daily.  9. Ativan 1 mg at bedtime. 10.Lasix 80 mg q.8 h.  11.Potassium 20 mEq  b.i.d.  12.Magnesium oxide 400 mg daily.  13.Dopamine.  14.Dobutamine.   He will have a PPD skin test placed today at 11 a.m.  I assume this will  be done, and we will mark the area.   Please call us for any questions or concerns.  Dr. Corliss Marcus was the  patient's physician during the hospitalization.  His number is [phone].  He can also be paged at [phone] for any questions or  concerns relating to this patient's hospital stay.      Guy Franco, P.A.      Francisca December, M.D.  Electronically Signed    LB/MEDQ  D:  05/06/2007  T:  05/06/2007  Job:  [id number]

## 2011-03-27 NOTE — Letter (Signed)
July 21, 2007    Dr. Leonie Man  [address]  [place], [place] [address]   RE:  JAKAI, RISSE  MRN:  [id number]  /  DOB:  03/28/1966   Dear Dr. Monia Pouch,   I am writing this letter to you with regards to Mr. Timothey Dahlstrom.  He  is a very pleasant 45 year old male with a recent history of severe  congestive heart failure secondary to a nonischemic cardiomyopathy.  He  has already had some recovery of his ejection fraction and is doing  quite well.  He is currently scheduled for a tooth extraction; however,  given his recent admission for decompensated heart failure, I do think  we need to be quite careful, and I have asked him to avoid general  anesthesia if at all possible.  I suggested the possibility using a  conscious sedation protocol, which I feel would be safe in his case.  I  have thus cleared him for dental surgery, if it can be done with  conscious sedation.  Please feel free to call me if you have any  questions regarding this matter.    Sincerely,      Bevelyn Buckles. Bensimhon, MD  Electronically Signed    DRB/MedQ  DD: 07/21/2007  DT: 07/21/2007  Job #: [id number]

## 2011-03-27 NOTE — Assessment & Plan Note (Signed)
[facility]                            CARDIOLOGY OFFICE NOTE   LAWTON, DOLLINGER                     MRN:          [id number]  DATE:12/10/2007                            DOB:          September 14, 1966    INTERVAL HISTORY:  Mr. Burkitt is a very pleasant 45 year old male with  a history of congestive heart failure secondary nonischemic  cardiomyopathy with biventricular dysfunction in the setting of previous  polysubstance abuse.  Subsequently his ejection fraction has returned to  normal.  He returns here routine follow-up.   He is doing great.  He went to a dog show over the weekend and was right  around all over the place without any problems.  He does note that his  weight continues to inch up a bit and he says he has a big appetite.  He  has not any orthopnea, no PND.  Current medications are Coreg 25 b.i.d.  Diovan 160 a day, Lasix 40 day, magnesium oxide 800 a day, allopurinol  100 a day, potassium 20 a day, spirolactone 25 a day.   PHYSICAL EXAM:  He is well-appearing no acute distress ambulates around  the clinic without respiratory difficulty.  Blood pressure is 101/68, heart rate 78, weight is 262.  HEENT is normal.  Neck is supple.  No JVD.  Carotid 2+ bilateral bruits.  There is no  lymphadenopathy or thyromegaly.  CARDIAC:  PMI is nondisplaced.  Has regular rate and rhythm.  No S3, no  murmur.  LUNGS:  Clear.  ABDOMEN:  Obese, nontender, nondistended no hepatosplenomegaly.  No  bruits, no masses.  Good bowel sounds.  EXTREMITIES:  Warm.  No cyanosis, clubbing or edema.  No rash.  NEURO:  He is alert and oriented x3.  Cranial nerves II-XII are intact.  Moves all four extremities without difficulty.  Affect is pleasant.   ASSESSMENT/PLAN:  Mr. Teicher is doing great.  His EF has recovered.  We  will go ahead and stop his Lasix and potassium and he will just use them  on a p.r.n. basis.  We will see him back for routine follow-up in 3  months.  I told him to try to do his best to watch his weight.  He has  been abstinent from all illicit substances.  I congratulated him on  this.     Bevelyn Buckles. Bensimhon, MD  Electronically Signed    DRB/MedQ  DD: 12/10/2007  DT: 12/10/2007  Job #: [id number]

## 2011-03-27 NOTE — H&P (Signed)
Jason Floyd, MONACO NO.:  [id number]   MEDICAL RECORD NO.:  [id number]          PATIENT TYPE:  EMS   LOCATION:  MAJO                         FACILITY:  [facility]   PHYSICIAN:  Rosalyn Gess. Norins, MD  DATE OF BIRTH:  11/01/1966   DATE OF ADMISSION:  04/26/2007  DATE OF DISCHARGE:                              HISTORY & PHYSICAL   CHIEF COMPLAINT:  Jaundice, pallor, discomfort.   HISTORY OF PRESENT ILLNESS:  Jason Floyd a 45 year old married white  male was seen yesterday by Dr. August Saucer Mitchell's PA for poor color, blood  in his urine, cough and question of ascites.  He was diagnosed with  bronchitis, started on a azithromycin.  He was scheduled for further  studies on Monday June 16.  He was advised if he had progressive  symptoms in terms of pain, discomfort, shortness of breath that he  should present to the emergency department.  The patient has had pallor  and increasing shortness of breath and discomfort and therefore presents  to the ER.  Initial evaluation revealed a leukocytosis.  He has ascites  by CT of the abdomen and pelvis with some of hepatomegaly.  Chest x-ray  with a right pleural effusion and question of possible pneumonia.  Patient is now admitted for treatment.   PAST MEDICAL HISTORY:   SURGERIES:  None.   MEDICAL:  Usual childhood diseases, pneumonia in his early 47s,  mononucleosis in the past, history of gout.  History of hyperlipidemia.   CURRENT MEDICATIONS:  1. Lipitor 20 mg daily  2. Allopurinol 200 mg daily  3. Zyrtec.   ALLERGIES:  The patient has no known drug allergies.   HABITS:  The patient is a smoker but now down to three to five  cigarettes per day.  Alcohol use:  He has 5 years of heavy drinking but  for the last 3-4 years has been moderate averaging 2-4 ounces per week.  The patient has occasional THC use and did do drug experimentation in  this youth.   FAMILY HISTORY:  Father 10 has gout.  Mother is 79 and healthy.  Positive family history for MI in paternal and maternal grandfathers, no  history of colon cancer, diabetes, lung cancer, lung disease.   SOCIAL HISTORY:  The patient has a B.A. in business from the [employer] [place].  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  He has been married one and a  half years, has an adopted child.   REVIEW OF SYSTEMS:  Is negative for chills or rigors,  positive for  shortness of breath.  Positive for abdominal pain and discomfort,  negative for palpitations, chest pain related to cardiac causes.   PHYSICAL EXAMINATION:  On examination temperature of 97.3, blood  pressure 101/72, heart rate 117, respirations 22, O2 sat 96% on room  air. This is a well-nourished, overweight Caucasian male with pallor  especially when sitting, with mild shortness of breath.  HEENT: Exam normocephalic, atraumatic.  Conjunctiva and sclerae was  clear.  Oropharynx without lesions.  NECK:  Supple.  No thyromegaly was noted.  No adenopathy was noted this  submandibular, cervical, supraclavicular, axillary regions.  CHEST:  No deformities are noted.  LUNGS:  The patient has decreased breath sounds at the right base and  positive egophony at the right base.  No rales no wheezes.  He has no  increased work of breathing with no use of accessory muscles of  respiration.  No neck retractions.  CARDIOVASCULAR:  2+ radial pulse with JVD or carotid bruit.  He had a  quiet precordium and regular rate and rhythm without murmurs, rubs or  gallops.  ABDOMEN: Positive bowel sounds all four quadrants.  He has a fluid wave.  There is no organosplenomegaly.  He has diffuse tenderness but no  guarding or rebound.  Rectal and genitalia exams deferred.  EXTREMITIES: No clubbing, cyanosis or edema.  NEUROLOGIC:  Exam the patient is awake, alert, oriented to person,  place, time and context.  Cranial nerves were grossly intact and exam  is  nonfocal.  DERM:  The patient has normal nailbeds, no splinter hemorrhages.  He has  no some spider angiomata or ADMs.   LABORATORY:  Hemoglobin 14.3 grams, white count 17,300 was 78% segs, 13%  lymphs, 8% monos.  Platelet count for 401,000.  Sodium 132, potassium  4.8, chloride 101, CO2 21, BUN 18, creatinine 1.4, glucose 157, SGPT 53,  SGOT 43, total protein 6.5, albumin 3.5.  CT abdomen and pelvis with ascites, kidney stone without hydronephrosis.  Chest x-ray:  With right pleural effusion, possible right lower lobe  infiltrate.   ASSESSMENT/PLAN:  1. Ascites.  Patient with a drinking history 5 years ago but no      stigmata of cirrhosis for prior diagnosis.  Liver is homogeneous on      CT scan.  He has a normal pancreas.Marland Kitchen  LFTs are basically normal      with minimal elevation in SGPT.  Etiologies to consider include      hepatitis, malignancy, cirrhosis, heart failure although the      patient has no known heart disease.   PLAN:  Paracentesis with ultrasound guidance for cell count, albumin,  microbiology, cytology, CEA.  Other lab work to include CEA, AST,  chronic hepatitis B panel, chronic hepatitis C panel.  2-D echo to rule  out cardiac failure given his elevated BNP.   Lasix 40 mg IV q. 12, Aldactone 25 mg p.o. daily to relieve his  shortness of breath and discomfort.  1. ID.  The patient with a leukocytosis but no fever, tenderness in      his abdomen.  No clear-cut evidence of bronchitis or pneumonia.      Patient has diffuse abdominal tenderness with white count and does      suggest possible spontaneous bacterial peritonitis.   PLAN:  Cefotaxime 2 grams IV q. Eight, blood cultures, urine culture and  then culture and microbiology of ascitic fluids.  1. Nephrolithiasis.  The patient was 7.5 mm stone at the UPJ at the      right, patient with minimal discomfort.  There is no     hydronephrosis, doubt there is a urinary tract infection.  This      could be a rare  cause of ascites if there was      any breech or leak of the ureter.  Plan Toradol 30 mg IV q.a.c.      p.r.n. pain.  Follow-up KUB for stone migration.  The patient may  need urology consult if stone does not move or pass.   In summary a 45 year old gentleman with new onset ascites and shortness  of breath, leukocytosis.      Rosalyn Gess Norins, MD  Electronically Signed     MEN/MEDQ  D:  04/26/2007  T:  04/27/2007  Job:  [id number]   cc:   L. Lupe Carney, M.D.

## 2011-03-27 NOTE — Assessment & Plan Note (Signed)
[facility]                            CARDIOLOGY OFFICE NOTE   DAYTON, KENLEY                     MRN:          [id number]  DATE:08/20/2007                            DOB:          [birth year]    INTERVAL HISTORY:  Mr. Rosero is a very pleasant 45 year old male with  a history of congestive heart failure secondary to nonischemic  cardiomyopathy with biventricular dysfunction in the setting of previous  polysubstance abuse.  Onset of this was in June.  His initial acute  course was complicated by pulmonary emboli.  He returns today for  routine followup.   Overall, he is doing very well.  His was away this weekend at a dog  training show, and was able to do all activities without any significant  limitation.  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  He has been  gaining a bit of weight because he says he has been eating veraciously.  He denies any shortness of breath.  No lower extremity edema.  No  orthopnea.  No PND.  He is tolerating the titration of his medications  well.   CURRENT MEDICATIONS:  1. Carvedilol 12.5 b.i.d.  2. Spironolactone 25 a day.  3. Potassium 20 a day.  4. Diovan 80 a day.  5. Warfarin.  6. Allopurinol 100 a day.  7. Magnesium oxide 800 a day.  8. Lasix 40 b.i.d.  9. Digoxin 0.25 a day.   PHYSICAL EXAMINATION:  He is well appearing.  I no acute distress.  Ambulates around the clinic without any respiratory difficulty.  Blood pressure 120/78.  Heart rate 82.  Weight is 239.  HEENT:  Normal.  NECK:  Supple.  No JVD.  Carotids are 2+ bilaterally without any bruits.  There is no lymphadenopathy or thyromegaly.  CARDIAC:  His PMI is mildly displaced laterally.  He has regular rate  and rhythm.  No S3.  No murmur.  LUNGS:  Clear.  ABDOMEN:  Soft, nontender, and non-distended.  No hepatosplenomegaly.  No bruits.  No masses.  Good bowel sounds.  EXTREMITIES:  Warm with no cyanosis, clubbing, or edema.  No rash.  NEUROLOGIC:  He is alert and oriented x3.  Cranial nerves 2 through 12  are intact.  Moves all 4 extremities without difficulty.  Affect is very  pleasant.  Most recent echocardiogram shows an EF of 35% to 45%.  He has had some  reverse remodeling of his LV.  It is now down to 55 mm from 59 mm.  His  RV function looked normal.   ASSESSMENT AND PLAN:  Congestive heart failure secondary to nonischemic  cardiomyopathy.  He is New York Heart Association Class I to II.  He is  doing quite well.  He has had significant recovery in his left  ventricular function.  I suspect he will make a complete recovery.  For  now, we will increase his Diovan to 120 mg a day, and I told him if he  is feeling well he can go ahead and increase it to 160 over the next  week.  We  will see him back in 1 month for routine followup, given that  his ejection fraction is greater than 35%.  I do not think he will need  a defibrillator at this time.  I suspect we will be able to take him off  Coumadin in the near future.     Bevelyn Buckles. Bensimhon, MD  Electronically Signed    DRB/MedQ  DD: 08/20/2007  DT: 08/21/2007  Job #: [id number]   cc:   Versie Starks, M.D.
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
## 2011-03-27 NOTE — Assessment & Plan Note (Signed)
[facility]                            CARDIOLOGY OFFICE NOTE   BRYCE, CHEEVER                     MRN:          [id number]  DATE:07/03/2007                            DOB:          02-02-1966    INTERVAL HISTORY:  Mr. Matsuo is a very pleasant 45 year old male with  a history of congestive heart failure secondary to severe nonischemic  biventricular heart failure in the setting of previous polysubstance  abuse.  He returns today for routine followup.  Overall, he has been  doing great.  He is walking with his wife a mile a day without any chest  pain or shortness of breath.  He has not had any lower extremity edema,  orthopnea, or PND.  No palpitations, no syncope or presyncope.  He felt  a little sluggish when we titrated his Coreg up at the last visit, but  is now tolerating it just fine.   CURRENT MEDICATIONS:  1. Spironolactone 25 a day.  2. Potassium 20 a day.  3. Diovan 80 a day.  4. Warfarin 7.5 daily.  5. Magnesium.  6. Lasix 40 b.i.d.  7. Digoxin 0.25 a day.  8. Carvedilol 6.25 b.i.d.   PHYSICAL EXAMINATION:  He is well appearing in no acute distress.  Blood pressure is 100/60, heart rate is 76, weight is 225.  HEENT:  Normal.  NECK:  Supple, no JVD.  Carotids are 2+ bilaterally without any bruits.  There is no lymphadenopathy or thyromegaly.  CARDIAC:  His PMI is mildly displaced laterally.  He has a regular rate  and rhythm, no S3.  LUNGS:  Clear.  ABDOMEN:  Soft, nontender, nondistended, no hepatosplenomegaly, no  bruits, no masses.  Good bowel sounds.  EXTREMITIES:  Warm with no cyanosis, clubbing, or edema.  No rash.  NEURO:  Alert and oriented x3.  Cranial nerves II-XII are intact.  Moves  all 4 extremities without difficulty.  Affect is very pleasant.   ASSESSMENT AND PLAN:  Congestive heart failure:  He is much improved.  His heart rate is really coming down well.  His blood pressure is coming  up slowly.  I did  put an echo probe down his chest today, and quick  look, it looks like his ejection fraction is about 30% to 35%, so there  is evidence of recovery.  We will go ahead and titrate his Coreg up to  9.375 b.i.d. and see him back for routine followup in several weeks.  I  suspect we may be able to decrease his diuretics  some, and hopefully continue to increase his angiotensin-converting  enzyme inhibitor and beta blocker.  We are hoping for full recovery.     Bevelyn Buckles. Bensimhon, MD  Electronically Signed    DRB/MedQ  DD: 07/03/2007  DT: 07/04/2007  Job #: [id number]

## 2011-03-27 NOTE — Assessment & Plan Note (Signed)
[facility]                            CARDIOLOGY OFFICE NOTE   Jason Floyd, Jason Floyd                     MRN:          [id number]  DATE:10/26/2008                            DOB:          October 26, 1966    INTERVAL HISTORY:  Jason Floyd is a very pleasant 45 year old male with the  history of severe congestive heart failure secondary to nonischemic  cardiomyopathy with biventricular dysfunction in the setting of previous  polysubstance abuse.  His ejection fraction has returned totally to  normal.  He returns to do routine followup.   He has been doing great.  No chest pain or shortness of breath.  He is  not smoking anymore.  He is fairly active.  He has been trying to lose  weight, although he has been cycling up and down some.  He has been  compliant with all his medications.  He has not had any heart failure  symptoms.   CURRENT MEDICATIONS:  1. Spironolactone 25 a day.  2. Allopurinol 100 a day.  3. Diovan 160 a day.  4. Coreg 25 b.i.d.   PHYSICAL EXAMINATION:  GENERAL:  He is well appearing, in no acute  distress.  He ambulates around the clinic without any respiratory  difficulty.  VITAL SIGNS:  Blood pressure is 100/72, heart rate is 59, and weight is  270.  HEENT:  Normal.  NECK:  Supple.  No JVD.  Carotids are 2+ bilaterally without any bruits.  There is no lymphadenopathy or thyromegaly.  CARDIAC:  PMI is nonpalpable.  He has a regular rate and rhythm.  No  murmurs, rubs, or gallops.  LUNGS:  Clear.  ABDOMEN:  Obese, nontender, nondistended.  No hepatosplenomegaly, no  bruits, no masses.  Good bowel sounds.  EXTREMITIES:  Warm.  No  cyanosis, clubbing, or edema and no rash.  NEUROLOGIC:  Alert and oriented x3.  Cranial nerves II through XII  intact.  Moves all 4 extremities without difficulty.  Affect is  pleasant.   EKG shows sinus bradycardia at a rate of 59, left axis deviation, and  left anterior fascicular block.  Echocardiogram  shows normal ejection  fraction of 60%.   ASSESSMENT AND PLAN:  History of congestive heart failure secondary  nonischemic cardiomyopathy, this is resolved.  He is doing great.  We  will continue on his current  medications.  He will see [place] back for 1 year with followup.  I told him  to contact us sooner, if he has any problems.     Bevelyn Buckles. Bensimhon, MD  Electronically Signed    DRB/MedQ  DD: 10/26/2008  DT: 10/27/2008  Job #: [id number]

## 2011-03-27 NOTE — Assessment & Plan Note (Signed)
[facility]                            CARDIOLOGY OFFICE NOTE   Jason Floyd, Jason Floyd                     MRN:          [id number]  DATE:06/18/2007                            DOB:          1966-08-12    INTERVAL HISTORY:  [name] is a very pleasant 45 year old male with  a history of polysubstance abuse, including tobacco, cocaine, and  alcohol, which is now in remission.  He was recently admitted with  cardiogenic shock, sent to [facility], and evaluated by the transplant team.  His EF was 15%.  He has had problems with low output heart failure.  I  saw him for the first time last week.  At that time, he was somewhat  hypotensive, had a prominent S3, and was clammy.  We backed off on his  Lasix and had him come back this week for a followup.  He says he is  feeling excellent.  He is walking and doing some bike-riding without any  limitations.  He denies any chest pain or shortness of breath.  No  orthopnea, PND, or lower extremity edema.  His weight has gone up a few  pounds.  He knows how to take an extra dose of Lasix when he needs it.   CURRENT MEDICATIONS:  1. Spironolactone 25 a day.  2. Potassium 20 a day.  3. Diovan 80 a day.  4. Carvedilol 3.125 b.i.d.  5. Warfarin 7.5 a day.  6. Allopurinol.  7. Magnesium 800 a day.  8. Lasix 40 b.i.d.  9. Digoxin 0.25 a day.   PHYSICAL EXAMINATION:  He is well-appearing in no acute distress.  He  ambulates around the clinic without any respiratory difficulty.  VITAL SIGNS:  Blood pressure initially was 90/51, on manual test it was  85/60 in the right arm. Heart rate 103. His weight is 220 which is up  from 4 pounds from previously.  HEENT:  Normal.  NECK:  Supple. There is no JVD. Carotids are 2+ bilaterally without any  bruits. There is no lymphadenopathy or thyromegaly.  CARDIAC:  PMI is laterally displaced. He has a regular rate and rhythm  with perhaps a very faint S3. There is no murmur.  LUNGS:  Clear.  ABDOMEN:  Soft and nontender, nondistended. There is no  hepatosplenomegaly. No bruits. No masses. Good bowel sounds.  EXTREMITIES:  Warm with no cyanosis, clubbing, or edema. Good distal  pulses.  SKIN:  No rash.  NEUROLOGIC:  Awake, alert, and oriented x3. Cranial nerves II-XII are  intact. He can use all four extremities without difficulty. Affect is  pleasant.   ASSESSMENT AND PLAN:  Advanced congestive heart failure with an EF of  10% to 15% in the setting of polysubstance abuse. Although his blood  pressure remains somewhat low, he appears much better compensated today.  We will go ahead and try to increase his Coreg to 6.25 in the evening.  We will leave it at 3.125 in the morning for now. If he feels good after  five days, I told him that he could increase his morning dose. I did  warn him about possibly feeling worse with the titration of his beta  blocker and told him to use extra Lasix as needed. We will see him back  in two weeks for hopeful further titration of his medical regimen.     Bevelyn Buckles. Bensimhon, MD  Electronically Signed    DRB/MedQ  DD: 06/18/2007  DT: 06/19/2007  Job #: [id number]

## 2011-03-27 NOTE — Consult Note (Signed)
NAMEJERET, GOYER NO.:  [id number]   MEDICAL RECORD NO.:  [id number]          PATIENT TYPE:  INP   LOCATION:  2903                         FACILITY:  [facility]   PHYSICIAN:  Lyn Records, M.D.   DATE OF BIRTH:  Jul 27, 1966   DATE OF CONSULTATION:  05/05/2007  DATE OF DISCHARGE:                                 CONSULTATION   INDICATION FOR CONSULTATION:  Second opinion concerning heart failure.   CONCLUSIONS:  1. 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      a.     Pulmonary edema.      b.     Low cardiac output with sinus tachycardia.  2. History of substance abuse, particularly alcohol, with at least a      continuous 5-year history of daily alcohol intake greater than 1.7      liters of Bourbon per day.  This continued in that pattern      approximately 6 months ago.  There is also a history of      recreational cocaine use.  3. Obesity.   RECOMMENDATIONS:  1. I agree with the excellent management to this point.  Despite      therapy, the patient is still in class IV status.  It appears that      volume dependency and heart rate are determined in cardiac      outpatient.  With too much diuresis, his pressure falls.  With too      little diuresis, he is in pulmonary edema.  2. Would recommend referral to a heart transplant center.  Though he      is not a particularly classical transplant candidate because of his      substance abuse, there may be mechanical heart failure options that      may benefit this young gentleman, perhaps until he becomes a      transplant candidate.  He and the family have decided that of      options available, [facility] would be preferred by them.  3. Comments:  The patient is 45 years of age and was admitted to the      hospital on April 29, 2007, with dyspnea, fatigue, worsening for a      month.  He had not had chest discomfort, but he had noted      progressive fatigue, cough and at times  hemoptysis.  There is a      significant history of polysubstance abuse, with alcohol being the      predominant substance.  There is no prior history of heart disease.      He was evaluated, and initially, the evaluation centered around the      possibility of cirrhosis, because ascites was present.      Paracentesis was performed.  An echocardiogram was ultimately      performed, and his EF was found to be in the 10 to 20% range.      Cardiology consultation was then obtained, and diuresis with the      assistance of  inotropic agents was begun.  There was initial      improvement, and more recently there has been deterioration in      overall status.  The family wanted a second opinion concerning      management and other treatment options.   SIGNIFICANT MEDICAL PROBLEMS:  Include polysubstance abuse,  hyperlipidemia, history of gout and nephrolithiasis.   HABITS:  Smokes cigarettes heavily until a month ago.   MEDICATIONS:  On admission included Zyloprim.   CURRENT MEDICATIONS:  In addition to dopamine include Coreg 3.125 mg  b.i.d., furosemide, lactulose and aldactone.   FAMILY HISTORY:  Coronary atherosclerosis as the cause of death of both  of his grandfathers in their mid-50s.  There is no history of sudden  death.  His father is 51 and has no heart disease.   EXAMINATION:  GENERAL:  Patient is sitting at 50 degrees in bed.  He is  clinically very dyspneic.  His skin is cool and clammy.  Intermittent  coughing occurs.  VITAL SIGNS:  His blood pressure is 100/70, heart rate 120.  NECK:  It is difficult to evaluate his neck veins.  LUNGS:  Reveal diffuse rales with decreased breath sounds at the bases.  There is a loud summation gallop at the apex with a 1 to 2/6 systolic  murmur.  ABDOMEN:  Distended.  EXTREMITIES:  Reveal no edema.  NEUROLOGIC:  The patient is intact.   LABORATORY DATA:  Includes a chest x-ray from June 23 that demonstrates  bilateral airspace disease,  consistent with pulmonary edema.  A  myocardial perfusion study done on May 02, 2007 revealed an ejection  fraction of 20%.  There was no evidence of infarction or ischemia.  Echocardiogram performed earlier reportedly showed an ejection fraction  of 10%.  His potassium is 3.6, sodium 128.  BUN and creatinine are 17  and 0.93, hemoglobin 14.1, TSH 0.624, BNP 1191.   DISCUSSION:  The patient has terrible heart failure that has not  responded in a favorable way to therapy.  He is in class IV functional  status, and as such has a terrible short-term prognosis.  Perhaps, in  excess of 25%.  Though he may not currently be a transplant candidate,  his young age would dictate consideration of mechanical measures to  bridge possible transplantation, if the patient can reform to that  point.  This has been discussed with the patient and family.  Will  discuss with Dr. Amil Amen.      Lyn Records, M.D.  Electronically Signed     HWS/MEDQ  D:  05/06/2007  T:  05/06/2007  Job:  [id number]   cc:   [facility]

## 2011-08-23 LAB — POCT RAPID STREP A: Streptococcus, Group A Screen (Direct): NEGATIVE

## 2011-08-29 LAB — COMPREHENSIVE METABOLIC PANEL
ALT: 136 — ABNORMAL HIGH
ALT: 163 — ABNORMAL HIGH
ALT: 260 — ABNORMAL HIGH
ALT: 352 — ABNORMAL HIGH
ALT: 55 — ABNORMAL HIGH
ALT: 93 — ABNORMAL HIGH
AST: 120 — ABNORMAL HIGH
AST: 123 — ABNORMAL HIGH
AST: 265 — ABNORMAL HIGH
AST: 46 — ABNORMAL HIGH
AST: 49 — ABNORMAL HIGH
AST: 66 — ABNORMAL HIGH
Albumin: 2.4 — ABNORMAL LOW
Albumin: 2.8 — ABNORMAL LOW
Albumin: 2.9 — ABNORMAL LOW
Albumin: 2.9 — ABNORMAL LOW
Albumin: 3.1 — ABNORMAL LOW
Albumin: 3.1 — ABNORMAL LOW
Alkaline Phosphatase: 106
Alkaline Phosphatase: 68
Alkaline Phosphatase: 72
Alkaline Phosphatase: 87
Alkaline Phosphatase: 87
Alkaline Phosphatase: 93
BUN: 15
BUN: 20
BUN: 22
BUN: 23
BUN: 26 — ABNORMAL HIGH
BUN: 30 — ABNORMAL HIGH
CO2: 20
CO2: 23
CO2: 26
CO2: 27
CO2: 30
CO2: 31
Calcium: 8 — ABNORMAL LOW
Calcium: 8.3 — ABNORMAL LOW
Calcium: 8.5
Calcium: 8.6
Calcium: 8.6
Calcium: 8.7
Chloride: 85 — ABNORMAL LOW
Chloride: 90 — ABNORMAL LOW
Chloride: 94 — ABNORMAL LOW
Chloride: 95 — ABNORMAL LOW
Chloride: 96
Chloride: 98
Creatinine, Ser: 0.86
Creatinine, Ser: 1.08
Creatinine, Ser: 1.12
Creatinine, Ser: 1.39
Creatinine, Ser: 1.57 — ABNORMAL HIGH
Creatinine, Ser: 1.95 — ABNORMAL HIGH
GFR calc Af Amer: 46 — ABNORMAL LOW
GFR calc Af Amer: 59 — ABNORMAL LOW
GFR calc Af Amer: >60
GFR calc Af Amer: >60
GFR calc Af Amer: >60
GFR calc Af Amer: >60
GFR calc non Af Amer: 38 — ABNORMAL LOW
GFR calc non Af Amer: 49 — ABNORMAL LOW
GFR calc non Af Amer: 56 — ABNORMAL LOW
GFR calc non Af Amer: >60
GFR calc non Af Amer: >60
GFR calc non Af Amer: >60
Glucose, Bld: 113 — ABNORMAL HIGH
Glucose, Bld: 114 — ABNORMAL HIGH
Glucose, Bld: 119 — ABNORMAL HIGH
Glucose, Bld: 129 — ABNORMAL HIGH
Glucose, Bld: 161 — ABNORMAL HIGH
Glucose, Bld: 254 — ABNORMAL HIGH
Potassium: 3.5
Potassium: 3.7
Potassium: 4
Potassium: 4.3
Potassium: 4.4
Potassium: 5
Sodium: 125 — ABNORMAL LOW
Sodium: 129 — ABNORMAL LOW
Sodium: 130 — ABNORMAL LOW
Sodium: 132 — ABNORMAL LOW
Sodium: 133 — ABNORMAL LOW
Sodium: 134 — ABNORMAL LOW
Total Bilirubin: 1.2
Total Bilirubin: 1.5 — ABNORMAL HIGH
Total Bilirubin: 1.6 — ABNORMAL HIGH
Total Bilirubin: 1.6 — ABNORMAL HIGH
Total Bilirubin: 2.3 — ABNORMAL HIGH
Total Bilirubin: 3 — ABNORMAL HIGH
Total Protein: 6
Total Protein: 6
Total Protein: 6.1
Total Protein: 6.3
Total Protein: 6.3
Total Protein: 6.9

## 2011-08-29 LAB — DIFFERENTIAL
Basophils Absolute: 0
Basophils Absolute: 0.1
Basophils Absolute: 0.1
Basophils Relative: 0
Basophils Relative: 0
Basophils Relative: 0
Eosinophils Absolute: 0.1
Eosinophils Absolute: 0.1
Eosinophils Absolute: 0.1
Eosinophils Relative: 0
Eosinophils Relative: 0
Eosinophils Relative: 1
Lymphocytes Relative: 14
Lymphocytes Relative: 5 — ABNORMAL LOW
Lymphocytes Relative: 8 — ABNORMAL LOW
Lymphs Abs: 1.1
Lymphs Abs: 1.5
Lymphs Abs: 2.4
Monocytes Absolute: 1.1 — ABNORMAL HIGH
Monocytes Absolute: 1.1 — ABNORMAL HIGH
Monocytes Absolute: 1.3 — ABNORMAL HIGH
Monocytes Relative: 5
Monocytes Relative: 6
Monocytes Relative: 7
Neutro Abs: 14.1 — ABNORMAL HIGH
Neutro Abs: 16.5 — ABNORMAL HIGH
Neutro Abs: 19.9 — ABNORMAL HIGH
Neutrophils Relative %: 80 — ABNORMAL HIGH
Neutrophils Relative %: 85 — ABNORMAL HIGH
Neutrophils Relative %: 89 — ABNORMAL HIGH

## 2011-08-29 LAB — B-NATRIURETIC PEPTIDE (CONVERTED LAB)
Pro B Natriuretic peptide (BNP): 1078 — ABNORMAL HIGH
Pro B Natriuretic peptide (BNP): 1082 — ABNORMAL HIGH
Pro B Natriuretic peptide (BNP): 1191 — ABNORMAL HIGH
Pro B Natriuretic peptide (BNP): 1267 — ABNORMAL HIGH
Pro B Natriuretic peptide (BNP): 1270 — ABNORMAL HIGH
Pro B Natriuretic peptide (BNP): 872 — ABNORMAL HIGH
Pro B Natriuretic peptide (BNP): 938 — ABNORMAL HIGH

## 2011-08-29 LAB — HEPATITIS PANEL, ACUTE
HCV Ab: NEGATIVE
Hep A IgM: NEGATIVE
Hep B C IgM: NEGATIVE
Hepatitis B Surface Ag: NEGATIVE

## 2011-08-29 LAB — PROTIME-INR
INR: 1.2
Prothrombin Time: 15.6 — ABNORMAL HIGH

## 2011-08-29 LAB — AMYLASE, BODY FLUID: Amylase, Fluid: 7

## 2011-08-29 LAB — HEPATITIS A ANTIBODY, TOTAL: Hep A Total Ab: NEGATIVE

## 2011-08-29 LAB — PROTEIN ELECTROPH W RFLX QUANT IMMUNOGLOBULINS
Albumin ELP: 51.6 — ABNORMAL LOW
Alpha-1-Globulin: 9.4 — ABNORMAL HIGH
Alpha-2-Globulin: 13.6 — ABNORMAL HIGH
Beta 2: 4
Beta Globulin: 6.4
Gamma Globulin: 15
M-Spike, %: NOT DETECTED
Total Protein ELP: 6.6

## 2011-08-29 LAB — CBC
HCT: 41.3
HCT: 41.7
HCT: 42
HCT: 42.5
HCT: 42.8
HCT: 42.9
HCT: 43.4
Hemoglobin: 13.6
Hemoglobin: 13.9
Hemoglobin: 14
Hemoglobin: 14.1
Hemoglobin: 14.2
Hemoglobin: 14.2
Hemoglobin: 14.4
MCHC: 32.9
MCHC: 33.1
MCHC: 33.2
MCHC: 33.2
MCHC: 33.2
MCHC: 33.4
MCHC: 33.4
MCV: 92.1
MCV: 92.4
MCV: 94
MCV: 94.6
MCV: 94.9
MCV: 95
MCV: 95.6
Platelets: 232
Platelets: 233
Platelets: 242
Platelets: 294
Platelets: 306
Platelets: 312
Platelets: 375
RBC: 4.37
RBC: 4.43
RBC: 4.48
RBC: 4.51
RBC: 4.56
RBC: 4.56
RBC: 4.62
RDW: 14.8 — ABNORMAL HIGH
RDW: 15.2 — ABNORMAL HIGH
RDW: 15.4 — ABNORMAL HIGH
RDW: 15.5 — ABNORMAL HIGH
RDW: 15.5 — ABNORMAL HIGH
RDW: 15.7 — ABNORMAL HIGH
RDW: 15.8 — ABNORMAL HIGH
WBC: 15 — ABNORMAL HIGH
WBC: 15.2 — ABNORMAL HIGH
WBC: 17.6 — ABNORMAL HIGH
WBC: 19.1 — ABNORMAL HIGH
WBC: 19.4 — ABNORMAL HIGH
WBC: 22.3 — ABNORMAL HIGH
WBC: 23.9 — ABNORMAL HIGH

## 2011-08-29 LAB — BASIC METABOLIC PANEL
BUN: 12
BUN: 16
BUN: 17
BUN: 21
CO2: 27
CO2: 30
CO2: 31
CO2: 31
Calcium: 8.5
Calcium: 8.7
Calcium: 8.7
Calcium: 8.9
Chloride: 86 — ABNORMAL LOW
Chloride: 87 — ABNORMAL LOW
Chloride: 88 — ABNORMAL LOW
Chloride: 93 — ABNORMAL LOW
Creatinine, Ser: 0.87
Creatinine, Ser: 0.9
Creatinine, Ser: 0.93
Creatinine, Ser: 0.95
GFR calc Af Amer: >60
GFR calc Af Amer: >60
GFR calc Af Amer: >60
GFR calc Af Amer: >60
GFR calc non Af Amer: >60
GFR calc non Af Amer: >60
GFR calc non Af Amer: >60
GFR calc non Af Amer: >60
Glucose, Bld: 107 — ABNORMAL HIGH
Glucose, Bld: 134 — ABNORMAL HIGH
Glucose, Bld: 134 — ABNORMAL HIGH
Glucose, Bld: 176 — ABNORMAL HIGH
Potassium: 3.4 — ABNORMAL LOW
Potassium: 3.6
Potassium: 3.6
Potassium: 3.8
Sodium: 128 — ABNORMAL LOW
Sodium: 129 — ABNORMAL LOW
Sodium: 130 — ABNORMAL LOW
Sodium: 131 — ABNORMAL LOW

## 2011-08-29 LAB — AFB CULTURE WITH SMEAR (NOT AT ARMC)
Acid Fast Smear: NONE SEEN
Acid Fast Smear: NONE SEEN
Acid Fast Smear: NONE SEEN

## 2011-08-29 LAB — SODIUM, URINE, RANDOM: Sodium, Ur: <10

## 2011-08-29 LAB — URINALYSIS, ROUTINE W REFLEX MICROSCOPIC
Glucose, UA: NEGATIVE
Ketones, ur: NEGATIVE
Nitrite: NEGATIVE
Protein, ur: 30 — AB
Specific Gravity, Urine: 1.021
Urobilinogen, UA: 1
pH: 6

## 2011-08-29 LAB — CEA (CARCINOEMBRYONIC ANTIGEN), FLUID: CEA Fluid: 0.7 not reported

## 2011-08-29 LAB — SODIUM, URINE, TIMED
Sodium, 24H Ur: 225 — ABNORMAL HIGH
Sodium, Ur: 45
Urine Total Volume-UNA24: 5000

## 2011-08-29 LAB — TSH: TSH: 0.624

## 2011-08-29 LAB — CULTURE, RESPIRATORY W GRAM STAIN
Culture: NORMAL
Culture: NORMAL

## 2011-08-29 LAB — PROTEIN, BODY FLUID: Total protein, fluid: 3.7

## 2011-08-29 LAB — CK TOTAL AND CKMB (NOT AT ARMC)
CK, MB: 2.4
CK, MB: 3
Relative Index: 2.2
Relative Index: INVALID
Total CK: 136
Total CK: 82

## 2011-08-29 LAB — BODY FLUID CULTURE: Culture: NO GROWTH

## 2011-08-29 LAB — APTT: aPTT: 28

## 2011-08-29 LAB — URINE MICROSCOPIC-ADD ON

## 2011-08-29 LAB — URINE CULTURE
Colony Count: NO GROWTH
Culture: NO GROWTH

## 2011-08-29 LAB — ALBUMIN, FLUID (OTHER): Albumin, Fluid: 2.1

## 2011-08-29 LAB — TROPONIN I
Troponin I: 0.07 — ABNORMAL HIGH
Troponin I: 0.09 — ABNORMAL HIGH

## 2011-08-29 LAB — FERRITIN: Ferritin: 250 (ref 22–322)

## 2011-08-29 LAB — BODY FLUID CELL COUNT WITH DIFFERENTIAL
Eos, Fluid: 1
Lymphs, Fluid: 56
Monocyte-Macrophage-Serous Fluid: 24 — ABNORMAL LOW
Neutrophil Count, Fluid: 19
Total Nucleated Cell Count, Fluid: 540

## 2011-08-29 LAB — CULTURE, RESPIRATORY

## 2011-08-29 LAB — VITAMIN B12: Vitamin B-12: 340 (ref 211–911)

## 2011-08-29 LAB — AMMONIA
Ammonia: 32
Ammonia: 34

## 2011-08-29 LAB — EXPECTORATED SPUTUM ASSESSMENT W GRAM STAIN, RFLX TO RESP C

## 2011-08-29 LAB — PATHOLOGIST SMEAR REVIEW

## 2011-08-29 LAB — BILIRUBIN, DIRECT: Bilirubin, Direct: 1 — ABNORMAL HIGH

## 2011-08-29 LAB — CREATININE, URINE, RANDOM: Creatinine, Urine: 236.6

## 2011-08-29 LAB — PROTEIN, URINE, 24 HOUR
Collection Interval-UPROT: 24
Urine Total Volume-UPROT: 5000

## 2011-08-29 LAB — HIV ANTIBODY (ROUTINE TESTING W REFLEX): HIV: NONREACTIVE

## 2011-08-29 LAB — CREATININE, URINE, 24 HOUR
Collection Interval-UCRE24: 24
Creatinine, 24H Ur: 1610
Creatinine, Urine: 32.2
Urine Total Volume-UCRE24: 5000

## 2011-08-29 LAB — FOLATE: Folate: 5.5

## 2011-08-30 LAB — CBC
HCT: 42.9
Hemoglobin: 14.3
MCHC: 33.4
MCV: 94.7
Platelets: 401 — ABNORMAL HIGH
RBC: 4.53
RDW: 14.9 — ABNORMAL HIGH
WBC: 17.3 — ABNORMAL HIGH

## 2011-08-30 LAB — DIFFERENTIAL
Basophils Absolute: 0.2 — ABNORMAL HIGH
Basophils Relative: 1
Eosinophils Absolute: 0
Eosinophils Relative: 0
Lymphocytes Relative: 13
Lymphs Abs: 2.3
Monocytes Absolute: 1.4 — ABNORMAL HIGH
Monocytes Relative: 8
Neutro Abs: 13.4 — ABNORMAL HIGH
Neutrophils Relative %: 78 — ABNORMAL HIGH

## 2011-08-30 LAB — URINE CULTURE
Colony Count: NO GROWTH
Culture: NO GROWTH

## 2011-08-30 LAB — URINALYSIS, ROUTINE W REFLEX MICROSCOPIC
Glucose, UA: NEGATIVE
Ketones, ur: NEGATIVE
Nitrite: NEGATIVE
Protein, ur: 30 — AB
Specific Gravity, Urine: 1.031 — ABNORMAL HIGH
Urobilinogen, UA: 1
pH: 5.5

## 2011-08-30 LAB — COMPREHENSIVE METABOLIC PANEL
ALT: 43
AST: 53 — ABNORMAL HIGH
Albumin: 3.5
Alkaline Phosphatase: 55
BUN: 18
CO2: 21
Calcium: 8.7
Chloride: 101
Creatinine, Ser: 1.38
GFR calc Af Amer: >60
GFR calc non Af Amer: 57 — ABNORMAL LOW
Glucose, Bld: 157 — ABNORMAL HIGH
Potassium: 4.8
Sodium: 132 — ABNORMAL LOW
Total Bilirubin: 2.7 — ABNORMAL HIGH
Total Protein: 6.5

## 2011-08-30 LAB — HEPATITIS B SURFACE ANTIGEN: Hepatitis B Surface Ag: NEGATIVE

## 2011-08-30 LAB — CULTURE, BLOOD (ROUTINE X 2)
Culture: NO GROWTH
Culture: NO GROWTH

## 2011-08-30 LAB — PROTIME-INR
INR: 1.4
Prothrombin Time: 17.3 — ABNORMAL HIGH

## 2011-08-30 LAB — HEPATITIS C ANTIBODY: HCV Ab: NEGATIVE

## 2011-08-30 LAB — B-NATRIURETIC PEPTIDE (CONVERTED LAB): Pro B Natriuretic peptide (BNP): 1351 — ABNORMAL HIGH

## 2011-08-30 LAB — URINE MICROSCOPIC-ADD ON

## 2011-08-30 LAB — AFP TUMOR MARKER: AFP-Tumor Marker: 4.2

## 2011-08-30 LAB — APTT: aPTT: 29

## 2011-08-30 LAB — CEA: CEA: 1.7

## 2011-11-16 ENCOUNTER — Other Ambulatory Visit: Payer: Self-pay | Admitting: Internal Medicine

## 2011-11-16 NOTE — Telephone Encounter (Signed)
Pt has ov sch for 12-11-11 10.30am. Pt need refill on allopurinol 100mg  call into [facility] [phone]

## 2011-11-16 NOTE — Telephone Encounter (Signed)
Pt call back no longer med refill

## 2011-12-11 ENCOUNTER — Encounter: Payer: Self-pay | Admitting: Internal Medicine

## 2011-12-11 ENCOUNTER — Ambulatory Visit (INDEPENDENT_AMBULATORY_CARE_PROVIDER_SITE_OTHER): Payer: BC Managed Care – PPO | Admitting: Internal Medicine

## 2011-12-11 VITALS — BP 120/80 | HR 72 | Temp 98.5°F | Ht 74.0 in | Wt 266.0 lb

## 2011-12-11 DIAGNOSIS — I509 Heart failure, unspecified: Secondary | ICD-10-CM

## 2011-12-11 DIAGNOSIS — L259 Unspecified contact dermatitis, unspecified cause: Secondary | ICD-10-CM

## 2011-12-11 LAB — CBC WITH DIFFERENTIAL/PLATELET
Basophils Absolute: 0 10*3/uL (ref 0.0–0.1)
Basophils Relative: 0.2 % (ref 0.0–3.0)
Eosinophils Absolute: 0.4 10*3/uL (ref 0.0–0.7)
Eosinophils Relative: 3.3 % (ref 0.0–5.0)
HCT: 43.5 % (ref 39.0–52.0)
Hemoglobin: 15.4 g/dL (ref 13.0–17.0)
Lymphocytes Relative: 31 % (ref 12.0–46.0)
Lymphs Abs: 3.4 10*3/uL (ref 0.7–4.0)
MCHC: 35.5 g/dL (ref 30.0–36.0)
MCV: 90.7 fl (ref 78.0–100.0)
Monocytes Absolute: 0.8 10*3/uL (ref 0.1–1.0)
Monocytes Relative: 7.6 % (ref 3.0–12.0)
Neutro Abs: 6.3 10*3/uL (ref 1.4–7.7)
Neutrophils Relative %: 57.9 % (ref 43.0–77.0)
Platelets: 237 10*3/uL (ref 150.0–400.0)
RBC: 4.8 Mil/uL (ref 4.22–5.81)
RDW: 12.9 % (ref 11.5–14.6)
WBC: 10.9 10*3/uL — ABNORMAL HIGH (ref 4.5–10.5)

## 2011-12-11 LAB — HEPATIC FUNCTION PANEL
ALT: 37 U/L (ref 0–53)
AST: 23 U/L (ref 0–37)
Albumin: 4.6 g/dL (ref 3.5–5.2)
Alkaline Phosphatase: 52 U/L (ref 39–117)
Bilirubin, Direct: 0.1 mg/dL (ref 0.0–0.3)
Total Bilirubin: 0.7 mg/dL (ref 0.3–1.2)
Total Protein: 7.7 g/dL (ref 6.0–8.3)

## 2011-12-11 LAB — BASIC METABOLIC PANEL
BUN: 15 mg/dL (ref 6–23)
CO2: 29 mEq/L (ref 19–32)
Calcium: 9.9 mg/dL (ref 8.4–10.5)
Chloride: 103 mEq/L (ref 96–112)
Creatinine, Ser: 1 mg/dL (ref 0.4–1.5)
GFR: 86.08 mL/min (ref >60.00)
Glucose, Bld: 110 mg/dL — ABNORMAL HIGH (ref 70–99)
Potassium: 4 mEq/L (ref 3.5–5.1)
Sodium: 141 mEq/L (ref 135–145)

## 2011-12-11 LAB — LIPID PANEL
Cholesterol: 239 mg/dL — ABNORMAL HIGH (ref 0–200)
HDL: 35.3 mg/dL — ABNORMAL LOW (ref >39.00)
Total CHOL/HDL Ratio: 7
Triglycerides: 456 mg/dL — ABNORMAL HIGH (ref 0.0–149.0)
VLDL: 91.2 mg/dL — ABNORMAL HIGH (ref 0.0–40.0)

## 2011-12-11 LAB — LDL CHOLESTEROL, DIRECT: Direct LDL: 119.7 mg/dL

## 2011-12-11 LAB — TSH: TSH: 3.36 u[IU]/mL (ref 0.35–5.50)

## 2011-12-11 MED ORDER — ZOLPIDEM TARTRATE 5 MG PO TABS: 5.0000 mg | ORAL_TABLET | Freq: Every evening | ORAL | Status: DC | PRN

## 2011-12-11 MED ORDER — TRIAMCINOLONE ACETONIDE 0.025 % EX OINT: TOPICAL_OINTMENT | Freq: Two times a day (BID) | CUTANEOUS | Status: DC

## 2011-12-11 NOTE — Assessment & Plan Note (Signed)
Ok to use triamcinolone

## 2011-12-11 NOTE — Assessment & Plan Note (Signed)
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

## 2011-12-11 NOTE — Progress Notes (Signed)
Patient ID: Jason Floyd, male   DOB: [birth year], 46 y.o.   MRN: [id number]  CHF---no sxs, echo normal 2009. 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 Plantar fasciitis---uses meloxicam and a specific tape---some relief.  Past Medical History  Diagnosis Date  . PE (pulmonary embolism)   . Nephrolithiasis   . Drug abuse   . Eczema   . Gout   . CHF (congestive heart failure)     History   Social History  . Marital Status: Married    Spouse Name: N/A    Number of Children: N/A  . Years of Education: N/A   Occupational History  . Not on file.   Social History Main Topics  . Smoking status: Current Everyday Smoker  . Smokeless tobacco: Not on file  . Alcohol Use: Yes  . Drug Use: Yes    Special: Marijuana  . Sexually Active: Not on file   Other Topics Concern  . Not on file   Social History Narrative  . No narrative on file    Past Surgical History  Procedure Date  . Wisdom tooth extraction     No family history on file.  No Known Allergies  Current Outpatient Prescriptions on File Prior to Visit  Medication Sig Dispense Refill  . allopurinol (ZYLOPRIM) 100 MG tablet TAKE 1 TABLET DAILY  90 tablet  2  . DIOVAN 160 MG tablet TAKE 1 TABLET ONCE DAILY  90 tablet  2     patient denies chest pain, shortness of breath, orthopnea. Denies lower extremity edema, abdominal pain, change in appetite, change in bowel movements. Patient denies rashes, musculoskeletal complaints. No other specific complaints in a complete review of systems.   BP 120/80  Pulse 72  Temp(Src) 98.5 F (36.9 C) (Oral)  Ht 6\' 2"  (1.88 m)  Wt 266 lb (120.657 kg)  BMI 34.15 kg/m2  Well-developed well-nourished male in no acute distress. HEENT exam atraumatic, normocephalic, extraocular muscles are intact. Neck is supple. No jugular venous distention no thyromegaly. Chest clear to auscultation without increased work of breathing. Cardiac exam S1 and S2 are regular. Abdominal exam active  bowel sounds, soft, nontender. Extremities no edema.

## 2011-12-12 ENCOUNTER — Other Ambulatory Visit: Payer: Self-pay | Admitting: *Deleted

## 2011-12-12 MED ORDER — MELOXICAM 15 MG PO TABS: 15.0000 mg | ORAL_TABLET | Freq: Every day | ORAL | Status: DC

## 2012-01-09 ENCOUNTER — Other Ambulatory Visit: Payer: Self-pay | Admitting: *Deleted

## 2012-01-09 MED ORDER — ALLOPURINOL 100 MG PO TABS: 100.0000 mg | ORAL_TABLET | Freq: Every day | ORAL | Status: DC

## 2012-02-04 ENCOUNTER — Other Ambulatory Visit: Payer: Self-pay | Admitting: Internal Medicine

## 2012-02-04 ENCOUNTER — Other Ambulatory Visit: Payer: Self-pay

## 2012-02-04 MED ORDER — VALSARTAN 160 MG PO TABS: 160.0000 mg | ORAL_TABLET | Freq: Every day | ORAL | Status: DC

## 2012-02-04 NOTE — Telephone Encounter (Signed)
Pt needs refill of diovan  called into [facility] on [address] , pt out needs asap

## 2012-02-04 NOTE — Telephone Encounter (Signed)
They have been faxing for over a week now to get refill

## 2012-03-05 ENCOUNTER — Ambulatory Visit (INDEPENDENT_AMBULATORY_CARE_PROVIDER_SITE_OTHER): Payer: BC Managed Care – PPO | Admitting: Family Medicine

## 2012-03-05 ENCOUNTER — Encounter: Payer: Self-pay | Admitting: Family Medicine

## 2012-03-05 VITALS — BP 105/75 | HR 83 | Temp 98.0°F | Ht 73.0 in | Wt 262.4 lb

## 2012-03-05 DIAGNOSIS — M62838 Other muscle spasm: Secondary | ICD-10-CM

## 2012-03-05 MED ORDER — DICLOFENAC SODIUM 1 % TD GEL: 1.0000 "application " | Freq: Four times a day (QID) | TRANSDERMAL | Status: DC

## 2012-03-05 MED ORDER — CYCLOBENZAPRINE HCL 10 MG PO TABS: 10.0000 mg | ORAL_TABLET | Freq: Three times a day (TID) | ORAL | Status: AC | PRN

## 2012-03-05 NOTE — Assessment & Plan Note (Signed)
New.  Start flexeril, topical NSAID prn.  Reviewed supportive care and red flags that should prompt return.  Pt expressed understanding and is in agreement w/ plan.

## 2012-03-05 NOTE — Progress Notes (Signed)
  Subjective:    Patient ID: Jason Floyd, male    DOB: 06/04/1966, 46 y.o.   MRN: [id number]  HPI Neck pain- 1 week ago fell asleep reading on a stack of pillows w/ neck in awkward position.  Now having L sided neck pain, 'like a pinched nerve'.  Went to chiropractor on Monday w/ some improvement.  Radiating to L scapula.  Denies weakness or numbness of L arm.  Pain w/ turning head towards L or neck extension.  Is on Mobic daily for plantar fasciitis w/out relief of neck pain and tylenol is not helping.  Took wife's left over muscle relaxer w/ some relief.   Review of Systems For ROS see HPI     Objective:   Physical Exam  Vitals reviewed. Constitutional: He appears well-developed and well-nourished. No distress.  HENT:  Head: Normocephalic and atraumatic.  Neck: Normal range of motion. Neck supple.       L upper trap spasm (-) Sperling's          Assessment & Plan:

## 2012-03-05 NOTE — Patient Instructions (Signed)
Follow up as scheduled This is a trap spasm!!! Start the Flexeril for the muscle spasm- this may cause drowsiness so take at night to start Continue the meloxicam daily, use the Voltaren gel for additional pain relief Use a heating pad for relief Call with any questions or concerns Hang in there!!!

## 2012-03-10 ENCOUNTER — Telehealth: Payer: Self-pay | Admitting: *Deleted

## 2012-03-10 NOTE — Telephone Encounter (Signed)
Prior Auth Approved 02-06-12 until 03-07-13, approval letter scan to chart.

## 2012-04-17 ENCOUNTER — Ambulatory Visit (INDEPENDENT_AMBULATORY_CARE_PROVIDER_SITE_OTHER): Payer: BC Managed Care – PPO | Admitting: Family Medicine

## 2012-04-17 ENCOUNTER — Encounter: Payer: Self-pay | Admitting: Family Medicine

## 2012-04-17 VITALS — BP 130/77 | HR 100 | Temp 98.3°F | Ht 73.0 in | Wt 258.6 lb

## 2012-04-17 DIAGNOSIS — E785 Hyperlipidemia, unspecified: Secondary | ICD-10-CM

## 2012-04-17 DIAGNOSIS — Z0000 Encounter for general adult medical examination without abnormal findings: Secondary | ICD-10-CM

## 2012-04-17 DIAGNOSIS — M542 Cervicalgia: Secondary | ICD-10-CM

## 2012-04-17 MED ORDER — CYCLOBENZAPRINE HCL 10 MG PO TABS: 10.0000 mg | ORAL_TABLET | Freq: Three times a day (TID) | ORAL | Status: DC | PRN

## 2012-04-17 NOTE — Progress Notes (Signed)
  Subjective:    Patient ID: Jason Floyd, male    DOB: 1966-06-01, 46 y.o.   MRN: [id number]  HPI CPE- due for labs  L trap spasm- seen in April, started on flexeril.  This helped relax the muscle but still having constant pain.  This is shooting down L arm and now having some numbness and tingling of forearm.  Has not seen ortho.  Stress level is 'really high'.  Went for massage w/ some relief.  Has seen Dr Orlan Leavens at [facility].   Review of Systems Patient reports no vision/hearing changes, anorexia, fever ,adenopathy, persistant/recurrent hoarseness, swallowing issues, chest pain, palpitations, edema, persistant/recurrent cough, hemoptysis, dyspnea (rest,exertional, paroxysmal nocturnal), gastrointestinal  bleeding (melena, rectal bleeding), abdominal pain, excessive heart burn, GU symptoms (dysuria, hematuria, voiding/incontinence issues) syncope, focal weakness, memory loss, numbness & tingling, skin/hair/nail changes, depression, anxiety, abnormal bruising/bleeding.     Objective:   Physical Exam BP 130/77  Pulse 100  Temp(Src) 98.3 F (36.8 C) (Oral)  Ht 6\' 1"  (1.854 m)  Wt 258 lb 9.6 oz (117.3 kg)  BMI 34.12 kg/m2  SpO2 95%  General Appearance:    Alert, cooperative, no distress, appears stated age  Head:    Normocephalic, without obvious abnormality, atraumatic  Eyes:    PERRL, conjunctiva/corneas clear, EOM's intact, fundi    benign, both eyes       Ears:    Normal TM's and external ear canals, both ears  Nose:   Nares normal, septum midline, mucosa normal, no drainage   or sinus tenderness  Throat:   Lips, mucosa, and tongue normal; teeth and gums normal  Neck:   Supple, symmetrical, trachea midline, no adenopathy;       thyroid:  No enlargement/tenderness/nodules.  + trap spasm on L  Back:     Symmetric, no curvature, ROM normal, no CVA tenderness  Lungs:     Clear to auscultation bilaterally, respirations unlabored  Chest wall:    No tenderness or deformity  Heart:     Regular rate and rhythm, S1 and S2 normal, no murmur, rub   or gallop  Abdomen:     Soft, non-tender, bowel sounds active all four quadrants,    no masses, no organomegaly  Genitalia:    Normal male without lesion, discharge or tenderness  Rectal:    Normal tone, normal prostate, no masses or tenderness  Extremities:   Extremities normal, atraumatic, no cyanosis or edema  Pulses:   2+ and symmetric all extremities  Skin:   Skin color, texture, turgor normal, no rashes or lesions  Lymph nodes:   Cervical, supraclavicular, and axillary nodes normal  Neurologic:   CNII-XII intact. Normal strength, sensation and reflexes      throughout          Assessment & Plan:

## 2012-04-17 NOTE — Assessment & Plan Note (Signed)
Pt's PE WNL.  Check labs.  Anticipatory guidance provided.  

## 2012-04-17 NOTE — Assessment & Plan Note (Signed)
Noted on last labs done by Dr Cato Mulligan.  Due for repeat labs.  Start meds prn.

## 2012-04-17 NOTE — Patient Instructions (Signed)
Follow up in 6 months or as needed Continue the Flexeril We'll call you with your ortho appt We'll notify you of your lab results Call with any questions or concerns Have a great summer!!!

## 2012-04-17 NOTE — Assessment & Plan Note (Signed)
Persistent.  Improved w/ NSAIDs and flexeril but pain decreased from 'unbearable to constant'.  Refer to ortho for complete evaluation and tx.  Pt expressed understanding and is in agreement w/ plan.

## 2012-04-18 ENCOUNTER — Encounter: Payer: Self-pay | Admitting: *Deleted

## 2012-04-18 LAB — CBC WITH DIFFERENTIAL/PLATELET
Basophils Absolute: 0 10*3/uL (ref 0.0–0.1)
Basophils Relative: 0.1 % (ref 0.0–3.0)
Eosinophils Absolute: 0.5 10*3/uL (ref 0.0–0.7)
Eosinophils Relative: 4.3 % (ref 0.0–5.0)
HCT: 47.1 % (ref 39.0–52.0)
Hemoglobin: 16.1 g/dL (ref 13.0–17.0)
Lymphocytes Relative: 20.2 % (ref 12.0–46.0)
Lymphs Abs: 2.1 10*3/uL (ref 0.7–4.0)
MCHC: 34.3 g/dL (ref 30.0–36.0)
MCV: 90.4 fl (ref 78.0–100.0)
Monocytes Absolute: 0.8 10*3/uL (ref 0.1–1.0)
Monocytes Relative: 8.1 % (ref 3.0–12.0)
Neutro Abs: 7 10*3/uL (ref 1.4–7.7)
Neutrophils Relative %: 67.3 % (ref 43.0–77.0)
Platelets: 196 10*3/uL (ref 150.0–400.0)
RBC: 5.21 Mil/uL (ref 4.22–5.81)
RDW: 12.1 % (ref 11.5–14.6)
WBC: 10.4 10*3/uL (ref 4.5–10.5)

## 2012-04-18 LAB — HEPATIC FUNCTION PANEL
ALT: 40 U/L (ref 0–53)
AST: 24 U/L (ref 0–37)
Albumin: 4.8 g/dL (ref 3.5–5.2)
Alkaline Phosphatase: 62 U/L (ref 39–117)
Bilirubin, Direct: 0.1 mg/dL (ref 0.0–0.3)
Total Bilirubin: 0.7 mg/dL (ref 0.3–1.2)
Total Protein: 7.6 g/dL (ref 6.0–8.3)

## 2012-04-18 LAB — LIPID PANEL
Cholesterol: 214 mg/dL — ABNORMAL HIGH (ref 0–200)
HDL: 37.7 mg/dL — ABNORMAL LOW (ref >39.00)
Total CHOL/HDL Ratio: 6
Triglycerides: 553 mg/dL — ABNORMAL HIGH (ref 0.0–149.0)
VLDL: 110.6 mg/dL — ABNORMAL HIGH (ref 0.0–40.0)

## 2012-04-18 LAB — BASIC METABOLIC PANEL
BUN: 17 mg/dL (ref 6–23)
CO2: 31 mEq/L (ref 19–32)
Calcium: 10 mg/dL (ref 8.4–10.5)
Chloride: 102 mEq/L (ref 96–112)
Creatinine, Ser: 1 mg/dL (ref 0.4–1.5)
GFR: 90.13 mL/min (ref >60.00)
Glucose, Bld: 87 mg/dL (ref 70–99)
Potassium: 4.5 mEq/L (ref 3.5–5.1)
Sodium: 139 mEq/L (ref 135–145)

## 2012-04-18 LAB — TSH: TSH: 2.8 u[IU]/mL (ref 0.35–5.50)

## 2012-04-18 LAB — PSA: PSA: 0.63 ng/mL (ref 0.10–4.00)

## 2012-04-18 LAB — LDL CHOLESTEROL, DIRECT: Direct LDL: 94.6 mg/dL

## 2012-04-18 MED ORDER — FENOFIBRATE 160 MG PO TABS: 160.0000 mg | ORAL_TABLET | Freq: Every day | ORAL | Status: DC

## 2012-04-18 NOTE — Progress Notes (Signed)
Addended by: [name] Lory A on: 04/18/2012 05:45 PM   Modules accepted: Orders

## 2012-05-13 ENCOUNTER — Other Ambulatory Visit (HOSPITAL_COMMUNITY): Payer: Self-pay | Admitting: Internal Medicine

## 2012-08-11 ENCOUNTER — Telehealth: Payer: Self-pay | Admitting: Family Medicine

## 2012-08-11 MED ORDER — VALSARTAN 160 MG PO TABS: 160.0000 mg | ORAL_TABLET | Freq: Every day | ORAL | Status: DC

## 2012-08-11 NOTE — Telephone Encounter (Signed)
rx sent to pharmacy by e-script  

## 2012-08-11 NOTE — Telephone Encounter (Signed)
Refill: Diovan 160 mg tablet. Take 1 tablet by mouth daily. Qty 90. Last fill 6.17.13

## 2012-08-19 ENCOUNTER — Other Ambulatory Visit: Payer: Self-pay | Admitting: Family Medicine

## 2012-08-19 MED ORDER — FENOFIBRATE 160 MG PO TABS: 160.0000 mg | ORAL_TABLET | Freq: Every day | ORAL | Status: DC

## 2012-08-19 NOTE — Telephone Encounter (Signed)
rx sent to pharmacy by e-script  

## 2012-08-19 NOTE — Telephone Encounter (Signed)
refill fenofibrate 160 MG Take 1 tablet (160 mg total) by mouth daily # 30 wt/3-refills last fill 9.4.13, last ov 6.6.13-est. care

## 2012-08-21 ENCOUNTER — Ambulatory Visit (INDEPENDENT_AMBULATORY_CARE_PROVIDER_SITE_OTHER): Payer: BC Managed Care – PPO | Admitting: Family Medicine

## 2012-08-21 ENCOUNTER — Encounter: Payer: Self-pay | Admitting: Family Medicine

## 2012-08-21 VITALS — BP 126/79 | HR 81 | Temp 98.2°F | Ht 73.0 in | Wt 255.0 lb

## 2012-08-21 DIAGNOSIS — E785 Hyperlipidemia, unspecified: Secondary | ICD-10-CM

## 2012-08-21 DIAGNOSIS — M5412 Radiculopathy, cervical region: Secondary | ICD-10-CM | POA: Insufficient documentation

## 2012-08-21 DIAGNOSIS — M5030 Other cervical disc degeneration, unspecified cervical region: Secondary | ICD-10-CM | POA: Insufficient documentation

## 2012-08-21 NOTE — Patient Instructions (Addendum)
We'll notify you of your lab results and make any med changes if needed We'll call you with your neurosurg appt- your job is to get your MRI on disk Hang in there!!

## 2012-08-21 NOTE — Progress Notes (Signed)
  Subjective:    Patient ID: Jason Floyd, male    DOB: 1965-12-27, 46 y.o.   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  Was started on pred pack, robaxin, hydrocodone.  pred pack 'halved the pain but it was still really bad'.  Had PT, epidural steroid injxn.  Epidural seemed to help, PT made it worse.  Has been seeing Dr Shelle Iron at [facility].  Did 2nd injxn, ran out of robaxin, and 'was again in the fetal position'.  Took 2nd pred pack w/ some relief.  Dr Shelle Iron recommended he see Dr Shon Baton.  Saw 2 days ago.  Now having loss of strength in tricep, numbness in L arm.  It was recommended to have anterior discectomy and fusion.  Pt now concerned about ortho vs neurosurg.  Concerned about anterior approach.   Review of Systems For ROS see HPI     Objective:   Physical Exam  Vitals reviewed. Constitutional: He appears well-developed and well-nourished. No distress.  Skin: Skin is warm and dry.  Psychiatric: He has a normal mood and affect. His behavior is normal. Thought content normal.          Assessment & Plan:

## 2012-08-21 NOTE — Assessment & Plan Note (Signed)
Pt was started on fenofibrate at last visit.  Due for repeat labs to assess control.

## 2012-08-21 NOTE — Assessment & Plan Note (Signed)
New to provider.  Reviewed hx w/ pt and course of events.  Advised pt to seek 2nd opinion prior to surgery as there are multiple different options/approaches.  Will refer to neurosurg.  Pt to get MRI from ortho.  Pt expressed understanding and is in agreement w/ plan.

## 2012-08-22 LAB — HEPATIC FUNCTION PANEL
ALT: 29 U/L (ref 0–53)
AST: 19 U/L (ref 0–37)
Albumin: 4.5 g/dL (ref 3.5–5.2)
Alkaline Phosphatase: 34 U/L — ABNORMAL LOW (ref 39–117)
Bilirubin, Direct: 0.1 mg/dL (ref 0.0–0.3)
Total Bilirubin: 0.5 mg/dL (ref 0.3–1.2)
Total Protein: 7.5 g/dL (ref 6.0–8.3)

## 2012-08-22 LAB — LIPID PANEL
Cholesterol: 195 mg/dL (ref 0–200)
HDL: 30.8 mg/dL — ABNORMAL LOW (ref >39.00)
Total CHOL/HDL Ratio: 6
Triglycerides: 305 mg/dL — ABNORMAL HIGH (ref 0.0–149.0)
VLDL: 61 mg/dL — ABNORMAL HIGH (ref 0.0–40.0)

## 2012-08-22 LAB — LDL CHOLESTEROL, DIRECT: Direct LDL: 133.2 mg/dL

## 2012-08-25 ENCOUNTER — Encounter: Payer: Self-pay | Admitting: Family Medicine

## 2012-08-26 MED ORDER — ATORVASTATIN CALCIUM 20 MG PO TABS: 20.0000 mg | ORAL_TABLET | Freq: Every day | ORAL | Status: DC

## 2012-08-26 NOTE — Addendum Note (Signed)
Addended by: [name] Lory A on: 08/26/2012 06:16 PM   Modules accepted: Orders

## 2012-09-03 ENCOUNTER — Telehealth: Payer: Self-pay | Admitting: Family Medicine

## 2012-09-03 DIAGNOSIS — L259 Unspecified contact dermatitis, unspecified cause: Secondary | ICD-10-CM

## 2012-09-03 NOTE — Telephone Encounter (Signed)
Refill: Triamcinolone 0.025% ointment. Apply externally twice daily as directed. Qty 30. Last fill 12-11-11

## 2012-09-09 MED ORDER — TRIAMCINOLONE ACETONIDE 0.025 % EX OINT: TOPICAL_OINTMENT | Freq: Two times a day (BID) | CUTANEOUS | Status: DC

## 2012-09-09 NOTE — Telephone Encounter (Signed)
Rx sent to same pharmacy from previous.       MW

## 2012-09-17 ENCOUNTER — Other Ambulatory Visit: Payer: Self-pay | Admitting: Internal Medicine

## 2012-10-15 ENCOUNTER — Other Ambulatory Visit (HOSPITAL_COMMUNITY): Payer: Self-pay | Admitting: Internal Medicine

## 2012-10-20 ENCOUNTER — Other Ambulatory Visit: Payer: Self-pay

## 2012-10-21 MED ORDER — CARVEDILOL 12.5 MG PO TABS: 12.5000 mg | ORAL_TABLET | Freq: Two times a day (BID) | ORAL | Status: DC

## 2012-11-06 ENCOUNTER — Telehealth: Payer: Self-pay | Admitting: Family Medicine

## 2012-11-06 MED ORDER — VALSARTAN 160 MG PO TABS: 160.0000 mg | ORAL_TABLET | Freq: Every day | ORAL | Status: DC

## 2012-11-06 NOTE — Telephone Encounter (Signed)
Refill: Diovan 160 mg tablet. Take 1 tablet by mouth daily. Qty 90. Last fill 08-11-12

## 2012-11-06 NOTE — Telephone Encounter (Signed)
Rx sent to pharmacy by e-script.//AB/CMA 

## 2012-12-01 ENCOUNTER — Other Ambulatory Visit: Payer: Self-pay | Admitting: Family Medicine

## 2012-12-18 ENCOUNTER — Telehealth: Payer: Self-pay | Admitting: Family Medicine

## 2012-12-18 NOTE — Telephone Encounter (Signed)
Refill: Fenofibrate 160 mg tablet. Take 1 tablet by mouth once daily. Qty 30. Last fill 11-20-12

## 2012-12-19 MED ORDER — FENOFIBRATE 160 MG PO TABS: 160.0000 mg | ORAL_TABLET | Freq: Every day | ORAL | Status: DC

## 2012-12-19 NOTE — Telephone Encounter (Signed)
Rx sent to the pharmacy by e-script.//AB/CMA 

## 2012-12-27 ENCOUNTER — Other Ambulatory Visit: Payer: Self-pay

## 2013-01-01 ENCOUNTER — Other Ambulatory Visit: Payer: Self-pay | Admitting: Family Medicine

## 2013-01-02 NOTE — Telephone Encounter (Signed)
Refill done.  

## 2013-01-19 ENCOUNTER — Other Ambulatory Visit: Payer: Self-pay | Admitting: Internal Medicine

## 2013-02-02 ENCOUNTER — Other Ambulatory Visit: Payer: Self-pay | Admitting: Family Medicine

## 2013-02-18 ENCOUNTER — Encounter (HOSPITAL_COMMUNITY): Payer: Self-pay

## 2013-02-18 ENCOUNTER — Ambulatory Visit (HOSPITAL_COMMUNITY)
Admission: RE | Admit: 2013-02-18 | Discharge: 2013-02-18 | Disposition: A | Discharge status: Home / Self Care | Payer: BC Managed Care – PPO | Source: Ambulatory Visit | Attending: Internal Medicine | Admitting: Internal Medicine

## 2013-02-18 VITALS — BP 110/70 | HR 75 | Wt 258.8 lb

## 2013-02-18 DIAGNOSIS — I5022 Chronic systolic (congestive) heart failure: Secondary | ICD-10-CM

## 2013-02-18 DIAGNOSIS — I509 Heart failure, unspecified: Secondary | ICD-10-CM | POA: Insufficient documentation

## 2013-02-18 MED ORDER — CARVEDILOL 12.5 MG PO TABS: 12.5000 mg | ORAL_TABLET | Freq: Two times a day (BID) | ORAL | Status: DC

## 2013-02-18 NOTE — Progress Notes (Signed)
Patient ID: Jason Floyd, male   DOB: Oct 31, 1966, 47 y.o.   MRN: [id number]  Weight Range   Baseline proBNP    PCP: Dr Beverely Low  HPI: [name] is 47 year old with a PMH of severe systolic heart failure due to NICM, former polysubstance abuse, S/P discectomy and spinal fusion 2/-/2014, and former tobacco ( quit 2014). He has had recovery of LV function for several years.   ECHO 12/2007 EF 55-60%  He returns for follow up. 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 Denies SOB/PND/Orthopnea. Compliant with medications. Denies lower extremity edema.   ROS: All systems negative except as listed in HPI, PMH and Problem List.  Past Medical History  Diagnosis Date  . PE (pulmonary embolism)   . Nephrolithiasis   . Drug abuse   . Eczema   . Gout   . CHF (congestive heart failure)      PHYSICAL EXAM: Filed Vitals:   02/18/13 0949  BP: 110/70  Pulse: 75  Weight: 258 lb 12.8 oz (117.391 kg)  SpO2: 97%    General:  Well appearing. No resp difficulty HEENT: normal Neck: supple. JVP flat. Carotids 2+ bilaterally; no bruits. No lymphadenopathy or thryomegaly appreciated. Cor: PMI normal. Regular rate & rhythm. No rubs, gallops or murmurs. Lungs: clear Abdomen: soft, nontender, nondistended. No hepatosplenomegaly. No bruits or masses. Good bowel sounds. Extremities: no cyanosis, clubbing, rash, edema Neuro: alert & orientedx3, cranial nerves grossly intact. Moves all 4 extremities w/o difficulty. Affect pleasant.  ASSESSMENT & PLAN:

## 2013-02-18 NOTE — Patient Instructions (Addendum)
Schedule ECHO   Follow up in 1 year or as needed

## 2013-02-19 ENCOUNTER — Telehealth: Payer: Self-pay | Admitting: Family Medicine

## 2013-02-19 NOTE — Telephone Encounter (Signed)
Last RF:04-28-12-last CPE:04-17-12.  Please advise.//AB/CMA

## 2013-02-19 NOTE — Telephone Encounter (Signed)
Ok for #30, 3 refills 

## 2013-02-19 NOTE — Telephone Encounter (Signed)
Refill: Zolpidem tartrate 5 mg tab. Take 1 tablet at bedtime as needed for sleep. Last fill 04-28-12

## 2013-02-20 MED ORDER — ZOLPIDEM TARTRATE 5 MG PO TABS: 5.0000 mg | ORAL_TABLET | Freq: Every evening | ORAL | Status: DC | PRN

## 2013-02-20 NOTE — Telephone Encounter (Signed)
Rx sent 

## 2013-02-24 ENCOUNTER — Ambulatory Visit (HOSPITAL_COMMUNITY)
Admission: RE | Admit: 2013-02-24 | Discharge: 2013-02-24 | Disposition: A | Discharge status: Home / Self Care | Payer: BC Managed Care – PPO | Source: Ambulatory Visit | Attending: Family Medicine | Admitting: Family Medicine

## 2013-02-24 DIAGNOSIS — I517 Cardiomegaly: Secondary | ICD-10-CM

## 2013-02-24 DIAGNOSIS — I428 Other cardiomyopathies: Secondary | ICD-10-CM | POA: Insufficient documentation

## 2013-02-24 DIAGNOSIS — Z8679 Personal history of other diseases of the circulatory system: Secondary | ICD-10-CM | POA: Insufficient documentation

## 2013-02-24 DIAGNOSIS — I509 Heart failure, unspecified: Secondary | ICD-10-CM

## 2013-02-24 NOTE — Assessment & Plan Note (Signed)
He has had full recovery of his LV function and continues to do well. Will check echo to ensure stability of LV function. Can wean valsartan to 80 bid if SBP consistently < 120.

## 2013-02-24 NOTE — Progress Notes (Signed)
  Echocardiogram 2D Echocardiogram has been performed.  Kaitelyn Jamison FRANCES 02/24/2013, 4:05 PM

## 2013-03-03 ENCOUNTER — Other Ambulatory Visit: Payer: Self-pay | Admitting: Family Medicine

## 2013-03-04 NOTE — Telephone Encounter (Signed)
Med filled.  

## 2013-03-10 ENCOUNTER — Telehealth: Payer: Self-pay | Admitting: General Practice

## 2013-03-10 MED ORDER — VALSARTAN 160 MG PO TABS: ORAL_TABLET | ORAL | Status: DC

## 2013-03-10 NOTE — Telephone Encounter (Signed)
Diovan refill request received. Pt last seen 10/13 and med last filled for #30 with 0 refills. Pt has an appt for labs on 5/1. Ok to fill?

## 2013-03-10 NOTE — Telephone Encounter (Signed)
Medication sent into pt pharmacy.

## 2013-03-10 NOTE — Telephone Encounter (Signed)
Ok for #30, 3 refills 

## 2013-03-12 ENCOUNTER — Other Ambulatory Visit: Payer: BC Managed Care – PPO

## 2013-03-16 ENCOUNTER — Encounter: Payer: Self-pay | Admitting: Lab

## 2013-03-17 ENCOUNTER — Other Ambulatory Visit (INDEPENDENT_AMBULATORY_CARE_PROVIDER_SITE_OTHER): Payer: BC Managed Care – PPO

## 2013-03-17 DIAGNOSIS — R945 Abnormal results of liver function studies: Secondary | ICD-10-CM

## 2013-03-17 DIAGNOSIS — R7989 Other specified abnormal findings of blood chemistry: Secondary | ICD-10-CM

## 2013-03-17 LAB — HEPATIC FUNCTION PANEL
ALT: 26 U/L (ref 0–53)
AST: 17 U/L (ref 0–37)
Albumin: 4.3 g/dL (ref 3.5–5.2)
Alkaline Phosphatase: 36 U/L — ABNORMAL LOW (ref 39–117)
Bilirubin, Direct: 0 mg/dL (ref 0.0–0.3)
Total Bilirubin: 0.4 mg/dL (ref 0.3–1.2)
Total Protein: 7.1 g/dL (ref 6.0–8.3)

## 2013-03-18 ENCOUNTER — Ambulatory Visit: Payer: BC Managed Care – PPO

## 2013-03-18 DIAGNOSIS — E785 Hyperlipidemia, unspecified: Secondary | ICD-10-CM

## 2013-03-18 LAB — LIPID PANEL
Cholesterol: 145 mg/dL (ref 0–200)
HDL: 30.8 mg/dL — ABNORMAL LOW (ref >39.00)
LDL Cholesterol: 85 mg/dL (ref 0–99)
Total CHOL/HDL Ratio: 5
Triglycerides: 148 mg/dL (ref 0.0–149.0)
VLDL: 29.6 mg/dL (ref 0.0–40.0)

## 2013-05-05 ENCOUNTER — Other Ambulatory Visit: Payer: Self-pay | Admitting: *Deleted

## 2013-05-05 MED ORDER — ATORVASTATIN CALCIUM 20 MG PO TABS: 20.0000 mg | ORAL_TABLET | Freq: Every day | ORAL | Status: DC

## 2013-05-05 NOTE — Telephone Encounter (Signed)
Rx sent to the pharmacy by e-script.//AB/CMA 

## 2013-06-11 ENCOUNTER — Other Ambulatory Visit: Payer: Self-pay | Admitting: Family Medicine

## 2013-07-06 ENCOUNTER — Other Ambulatory Visit: Payer: Self-pay | Admitting: *Deleted

## 2013-07-06 MED ORDER — VALSARTAN 160 MG PO TABS: ORAL_TABLET | ORAL | Status: DC

## 2013-07-06 NOTE — Telephone Encounter (Signed)
Rx has been refilled for diovan 160 mg Ag cma

## 2013-07-21 ENCOUNTER — Other Ambulatory Visit: Payer: Self-pay | Admitting: Family Medicine

## 2013-07-21 MED ORDER — ALLOPURINOL 100 MG PO TABS: ORAL_TABLET | ORAL | Status: DC

## 2013-07-21 NOTE — Telephone Encounter (Signed)
Pt made appointment for next Wed. 07/29/2013 @ 230. Rx filled #30, 6 refills per Dr. Beverely Low. SW, CMA

## 2013-07-21 NOTE — Telephone Encounter (Signed)
Pt has not been seen in office since 08/21/12. Pt needs to make an appointment to have uric acid test done. Pt made aware. SW, CMA

## 2013-07-29 ENCOUNTER — Ambulatory Visit (INDEPENDENT_AMBULATORY_CARE_PROVIDER_SITE_OTHER): Payer: BC Managed Care – PPO | Admitting: Family Medicine

## 2013-07-29 ENCOUNTER — Encounter: Payer: Self-pay | Admitting: Family Medicine

## 2013-07-29 VITALS — BP 106/70 | HR 86 | Temp 98.1°F | Ht 74.0 in | Wt 261.0 lb

## 2013-07-29 DIAGNOSIS — M62838 Other muscle spasm: Secondary | ICD-10-CM

## 2013-07-29 DIAGNOSIS — M109 Gout, unspecified: Secondary | ICD-10-CM

## 2013-07-29 MED ORDER — ALLOPURINOL 100 MG PO TABS: 200.0000 mg | ORAL_TABLET | Freq: Every day | ORAL | Status: DC

## 2013-07-29 MED ORDER — INDOMETHACIN 50 MG PO CAPS: 50.0000 mg | ORAL_CAPSULE | Freq: Three times a day (TID) | ORAL | Status: DC

## 2013-07-29 MED ORDER — HYDROCODONE-ACETAMINOPHEN 5-325 MG PO TABS: 1.0000 | ORAL_TABLET | Freq: Four times a day (QID) | ORAL | Status: DC | PRN

## 2013-07-29 MED ORDER — METHOCARBAMOL 500 MG PO TABS: 500.0000 mg | ORAL_TABLET | Freq: Three times a day (TID) | ORAL | Status: DC

## 2013-07-29 NOTE — Progress Notes (Signed)
  Subjective:    Patient ID: Jason Floyd, male    DOB: 11-16-1965, 47 y.o.   MRN: [id number]  HPI Gout- pt hasn't had a gout flare in 7-8 yrs.  Now R great MTP joint red, inflamed, painful- keeping pt up at night.  Extensive family hx of gout.  Pt was previously on 200mg  Allopurinol and did not have flares.  Pt has been taking 200mg  since flare started.  Trap spasm- pt asking for refill on Methocarbamol  Review of Systems For ROS see HPI     Objective:   Physical Exam  Vitals reviewed. Constitutional: He appears well-developed and well-nourished. No distress.  Musculoskeletal: He exhibits edema (of R great MTP joint) and tenderness.  Skin: Skin is warm and dry. There is erythema (overlying R great MTP joint).          Assessment & Plan:

## 2013-07-29 NOTE — Patient Instructions (Signed)
Start the Indocin 3x/day- take w/ food and taper quickly to 2x/day and then 1x/day and then only as needed Use the Hydrocodone as needed for severe pain Increase the Allopurinol to 200mg  daily Use the Robaxin as needed for spasm Call with any questions or concerns Hang in there!!!

## 2013-07-29 NOTE — Assessment & Plan Note (Signed)
Deteriorated.  Pt w/ 1st flare in years.  Start NSAIDs, increase allopurinol to 200mg .  Pain meds prn.  Check uric acid level.  Reviewed supportive care and red flags that should prompt return.  Pt expressed understanding and is in agreement w/ plan.

## 2013-07-29 NOTE — Assessment & Plan Note (Signed)
Refill provided on Robaxin.

## 2013-09-03 ENCOUNTER — Other Ambulatory Visit: Payer: Self-pay | Admitting: Family Medicine

## 2013-09-04 NOTE — Telephone Encounter (Signed)
Med filled.  

## 2013-09-17 ENCOUNTER — Other Ambulatory Visit: Payer: Self-pay

## 2013-10-23 ENCOUNTER — Other Ambulatory Visit: Payer: Self-pay | Admitting: Family Medicine

## 2013-10-23 NOTE — Telephone Encounter (Signed)
Med filled, pt needs an appt.

## 2013-12-07 ENCOUNTER — Other Ambulatory Visit: Payer: Self-pay | Admitting: Family Medicine

## 2013-12-07 NOTE — Telephone Encounter (Signed)
Med filled.  

## 2013-12-14 ENCOUNTER — Other Ambulatory Visit: Payer: Self-pay | Admitting: Family Medicine

## 2013-12-15 NOTE — Telephone Encounter (Signed)
Med filled.  

## 2013-12-21 ENCOUNTER — Other Ambulatory Visit: Payer: Self-pay | Admitting: Family Medicine

## 2013-12-21 NOTE — Telephone Encounter (Signed)
Med filled.  

## 2014-01-19 ENCOUNTER — Other Ambulatory Visit: Payer: Self-pay | Admitting: Family Medicine

## 2014-01-19 NOTE — Telephone Encounter (Signed)
Med filled.  

## 2014-02-08 ENCOUNTER — Other Ambulatory Visit: Payer: Self-pay | Admitting: Family Medicine

## 2014-02-08 NOTE — Telephone Encounter (Signed)
Med filled. Letter mailed to make an appt.

## 2014-02-24 ENCOUNTER — Encounter: Payer: Self-pay | Admitting: Lab

## 2014-02-24 ENCOUNTER — Encounter: Payer: Self-pay | Admitting: Family Medicine

## 2014-02-24 ENCOUNTER — Ambulatory Visit (INDEPENDENT_AMBULATORY_CARE_PROVIDER_SITE_OTHER): Payer: BC Managed Care – PPO | Admitting: Family Medicine

## 2014-02-24 VITALS — BP 118/74 | HR 76 | Temp 98.2°F | Resp 16 | Wt 265.4 lb

## 2014-02-24 DIAGNOSIS — I509 Heart failure, unspecified: Secondary | ICD-10-CM

## 2014-02-24 DIAGNOSIS — M62838 Other muscle spasm: Secondary | ICD-10-CM

## 2014-02-24 DIAGNOSIS — E785 Hyperlipidemia, unspecified: Secondary | ICD-10-CM

## 2014-02-24 LAB — BASIC METABOLIC PANEL
BUN: 17 mg/dL (ref 6–23)
CO2: 28 mEq/L (ref 19–32)
Calcium: 9.5 mg/dL (ref 8.4–10.5)
Chloride: 104 mEq/L (ref 96–112)
Creatinine, Ser: 1.2 mg/dL (ref 0.4–1.5)
GFR: 65.77 mL/min (ref >60.00)
Glucose, Bld: 145 mg/dL — ABNORMAL HIGH (ref 70–99)
Potassium: 3.9 mEq/L (ref 3.5–5.1)
Sodium: 138 mEq/L (ref 135–145)

## 2014-02-24 LAB — HEPATIC FUNCTION PANEL
ALT: 33 U/L (ref 0–53)
AST: 22 U/L (ref 0–37)
Albumin: 4.1 g/dL (ref 3.5–5.2)
Alkaline Phosphatase: 36 U/L — ABNORMAL LOW (ref 39–117)
Bilirubin, Direct: 0 mg/dL (ref 0.0–0.3)
Total Bilirubin: 0.6 mg/dL (ref 0.3–1.2)
Total Protein: 7.1 g/dL (ref 6.0–8.3)

## 2014-02-24 LAB — LIPID PANEL
Cholesterol: 139 mg/dL (ref 0–200)
HDL: 26.8 mg/dL — ABNORMAL LOW (ref >39.00)
LDL Cholesterol: 64 mg/dL (ref 0–99)
Total CHOL/HDL Ratio: 5
Triglycerides: 239 mg/dL — ABNORMAL HIGH (ref 0.0–149.0)
VLDL: 47.8 mg/dL — ABNORMAL HIGH (ref 0.0–40.0)

## 2014-02-24 MED ORDER — METHOCARBAMOL 500 MG PO TABS: 500.0000 mg | ORAL_TABLET | Freq: Three times a day (TID) | ORAL | Status: DC

## 2014-02-24 NOTE — Progress Notes (Signed)
   Subjective:    Patient ID: Jason Floyd, male    DOB: June 09, 1966, 48 y.o.   MRN: [id number]  HPI CHF- chronic problem, on Beta Blocker, ARB.  Good BP control.  No CP, SOB, HAs, visual changes, edema.  Hyperlipidemia- chronic problem. On Lipitor and fenofibrate.  No abd pain, N/V, myalgias.  Neck pain- pt continues to use Robaxin as needed.  Due for refill.   Review of Systems For ROS see HPI     Objective:   Physical Exam  Vitals reviewed. Constitutional: He is oriented to person, place, and time. He appears well-developed and well-nourished. No distress.  HENT:  Head: Normocephalic and atraumatic.  Eyes: Conjunctivae and EOM are normal. Pupils are equal, round, and reactive to light.  Neck: Normal range of motion. Neck supple. No thyromegaly present.  Cardiovascular: Normal rate, regular rhythm, normal heart sounds and intact distal pulses.   No murmur heard. Pulmonary/Chest: Effort normal and breath sounds normal. No respiratory distress.  Abdominal: Soft. Bowel sounds are normal. He exhibits no distension.  Musculoskeletal: He exhibits no edema.  Lymphadenopathy:    He has no cervical adenopathy.  Neurological: He is alert and oriented to person, place, and time. No cranial nerve deficit.  Skin: Skin is warm and dry.  Psychiatric: He has a normal mood and affect. His behavior is normal.          Assessment & Plan:

## 2014-02-24 NOTE — Assessment & Plan Note (Signed)
Refill on Robaxin provided

## 2014-02-24 NOTE — Assessment & Plan Note (Signed)
Pt is no longer following w/ Cards as systolic function returned to normal.  Remains on beta blocker and ARB.  Check labs.  No anticipated med changes.

## 2014-02-24 NOTE — Patient Instructions (Signed)
Schedule your complete physical in 6 months We'll notify you of your lab results and make any changes if needed Keep up the good work!  You look great! Call with any questions or concerns Happy Spring!!! 

## 2014-02-24 NOTE — Assessment & Plan Note (Signed)
Chronic problem.  Tolerating statin w/o difficulty.  Check labs.  Adjust meds prn  

## 2014-02-24 NOTE — Progress Notes (Signed)
Pre visit review using our clinic review tool, if applicable. No additional management support is needed unless otherwise documented below in the visit note. 

## 2014-03-04 ENCOUNTER — Other Ambulatory Visit: Payer: Self-pay | Admitting: General Practice

## 2014-03-04 MED ORDER — CARVEDILOL 12.5 MG PO TABS: 12.5000 mg | ORAL_TABLET | Freq: Two times a day (BID) | ORAL | Status: DC

## 2014-03-08 ENCOUNTER — Other Ambulatory Visit: Payer: Self-pay | Admitting: Family Medicine

## 2014-03-08 NOTE — Telephone Encounter (Signed)
Med filled.  

## 2014-03-29 ENCOUNTER — Other Ambulatory Visit: Payer: Self-pay | Admitting: Family Medicine

## 2014-03-29 NOTE — Telephone Encounter (Signed)
Med filled.  

## 2014-04-19 ENCOUNTER — Encounter: Payer: Self-pay | Admitting: Family Medicine

## 2014-04-19 ENCOUNTER — Ambulatory Visit (INDEPENDENT_AMBULATORY_CARE_PROVIDER_SITE_OTHER): Payer: BC Managed Care – PPO | Admitting: Family Medicine

## 2014-04-19 VITALS — BP 104/78 | HR 78 | Temp 98.0°F | Resp 16 | Wt 260.4 lb

## 2014-04-19 DIAGNOSIS — M546 Pain in thoracic spine: Secondary | ICD-10-CM

## 2014-04-19 DIAGNOSIS — M109 Gout, unspecified: Secondary | ICD-10-CM

## 2014-04-19 LAB — URIC ACID: Uric Acid, Serum: 6.2 mg/dL (ref 4.0–7.8)

## 2014-04-19 MED ORDER — TRAMADOL HCL 50 MG PO TABS: 50.0000 mg | ORAL_TABLET | Freq: Three times a day (TID) | ORAL | Status: DC | PRN

## 2014-04-19 MED ORDER — ALLOPURINOL 300 MG PO TABS: 300.0000 mg | ORAL_TABLET | Freq: Every day | ORAL | Status: DC

## 2014-04-19 MED ORDER — ZOLPIDEM TARTRATE 5 MG PO TABS: 5.0000 mg | ORAL_TABLET | Freq: Every evening | ORAL | Status: DC | PRN

## 2014-04-19 NOTE — Progress Notes (Signed)
   Subjective:    Patient ID: Jason Floyd, male    DOB: Aug 29, 1966, 48 y.o.   MRN: [id number]  HPI Back pain- pt had previous cervical spine fusion in 2013 by Dr Venetia Maxon.  Started 4 weeks ago and now 'really painful all the time'.  Taking more Robaxin than usual, Ibuprofen, Ambien w/o relief.  Heat w/o relief.  Pain this time is more thoracic, radiating out towards R scapula.  When he had cervical disc issue, the pain was also more lateral.    Gout- chronic problem, was previously taking 300mg  Allopurinol daily ('a long long time ago').  Previous PCP decreased him to 100mg  daily.  I increased him to 200mg  and this has been ok until recently when he again developed pain in big toe.  Interested in increasing back to 300mg .   Review of Systems For ROS see HPI     Objective:   Physical Exam  Vitals reviewed. Constitutional: He is oriented to person, place, and time. He appears well-developed and well-nourished. No distress.  HENT:  Head: Normocephalic and atraumatic.  Neck: Normal range of motion. Neck supple.  Cardiovascular: Intact distal pulses.   Musculoskeletal: He exhibits no edema.  + TTP over thoracic spine w/ R paraspinal spasm  Neurological: He is alert and oriented to person, place, and time. He has normal reflexes.  Skin: Skin is warm and dry. No erythema.  Psychiatric: He has a normal mood and affect. His behavior is normal. Thought content normal.          Assessment & Plan:

## 2014-04-19 NOTE — Progress Notes (Signed)
Pre visit review using our clinic review tool, if applicable. No additional management support is needed unless otherwise documented below in the visit note. 

## 2014-04-19 NOTE — Patient Instructions (Signed)
Follow up as needed We'll call you with your Neurosurg appt Start the Meloxicam daily for inflammation- no additional anti-inflammatories but tylenol is ok Use the tramadol as needed for severe pain Increase the Robaxin to 2-3x/day as needed HEAT! We'll notify you of your uric acid level Increase the Allopurinol to 300mg - 1 tab daily Call with any questions or concerns Hang in there!!

## 2014-04-20 MED ORDER — MELOXICAM 15 MG PO TABS: ORAL_TABLET | ORAL | Status: DC

## 2014-04-20 NOTE — Assessment & Plan Note (Signed)
Chronic problem for pt.  Having some breakthrough symptoms despite Allopurinol 200mg  daily.  Increase to 300mg  and check uric acid level

## 2014-04-20 NOTE — Assessment & Plan Note (Signed)
New.  Pt reports this feels similar to when he had cervical disc disease.  Start scheduled NSAIDs, flexeril for the paraspinal spasm.  Refer back to Dr Venetia Maxon. Reviewed supportive care and red flags that should prompt return.  Pt expressed understanding and is in agreement w/ plan.

## 2014-04-22 ENCOUNTER — Telehealth: Payer: Self-pay | Admitting: *Deleted

## 2014-04-22 NOTE — Telephone Encounter (Signed)
Prior authorization for zolpidem initiated. Awaiting response. JG//CMA

## 2014-06-01 ENCOUNTER — Other Ambulatory Visit: Payer: Self-pay | Admitting: Family Medicine

## 2014-06-01 NOTE — Telephone Encounter (Signed)
Med filled.  

## 2014-06-14 ENCOUNTER — Other Ambulatory Visit: Payer: Self-pay | Admitting: Family Medicine

## 2014-06-14 NOTE — Telephone Encounter (Signed)
Med filled.  

## 2014-07-06 ENCOUNTER — Encounter: Payer: Self-pay | Admitting: Family Medicine

## 2014-07-09 ENCOUNTER — Other Ambulatory Visit: Payer: Self-pay | Admitting: Family Medicine

## 2014-07-09 NOTE — Telephone Encounter (Signed)
Med filled.  

## 2014-08-16 ENCOUNTER — Other Ambulatory Visit: Payer: Self-pay | Admitting: Family Medicine

## 2014-08-16 NOTE — Telephone Encounter (Signed)
Med filled.  

## 2014-09-08 ENCOUNTER — Encounter: Payer: Self-pay | Admitting: Family Medicine

## 2014-09-08 ENCOUNTER — Ambulatory Visit (INDEPENDENT_AMBULATORY_CARE_PROVIDER_SITE_OTHER): Payer: BC Managed Care – PPO | Admitting: Family Medicine

## 2014-09-08 VITALS — BP 110/80 | HR 72 | Temp 98.2°F | Resp 16 | Ht 73.0 in | Wt 260.4 lb

## 2014-09-08 DIAGNOSIS — Z0000 Encounter for general adult medical examination without abnormal findings: Secondary | ICD-10-CM

## 2014-09-08 DIAGNOSIS — Z23 Encounter for immunization: Secondary | ICD-10-CM

## 2014-09-08 NOTE — Addendum Note (Signed)
Addended by: Jackson Latino on: 09/08/2014 04:06 PM   Modules accepted: Orders

## 2014-09-08 NOTE — Progress Notes (Signed)
   Subjective:    Patient ID: Jason Floyd, male    DOB: 29-Aug-1966, 48 y.o.   MRN: [id number]  HPI CPE- no concerns today.   Review of Systems Patient reports no vision/hearing changes, anorexia, fever ,adenopathy, persistant/recurrent hoarseness, swallowing issues, chest pain, palpitations, edema, persistant/recurrent cough, hemoptysis, dyspnea (rest,exertional, paroxysmal nocturnal), gastrointestinal  bleeding (melena, rectal bleeding), abdominal pain, excessive heart burn, GU symptoms (dysuria, hematuria, voiding/incontinence issues) syncope, focal weakness, memory loss, numbness & tingling, skin/hair/nail changes, depression, anxiety, abnormal bruising/bleeding, musculoskeletal symptoms/signs.     Objective:   Physical Exam General Appearance:    Alert, cooperative, no distress, appears stated age  Head:    Normocephalic, without obvious abnormality, atraumatic  Eyes:    PERRL, conjunctiva/corneas clear, EOM's intact, fundi    benign, both eyes       Ears:    Normal TM's and external ear canals, both ears  Nose:   Nares normal, septum midline, mucosa normal, no drainage   or sinus tenderness  Throat:   Lips, mucosa, and tongue normal; teeth and gums normal  Neck:   Supple, symmetrical, trachea midline, no adenopathy;       thyroid:  No enlargement/tenderness/nodules  Back:     Symmetric, no curvature, ROM normal, no CVA tenderness  Lungs:     Clear to auscultation bilaterally, respirations unlabored  Chest wall:    No tenderness or deformity  Heart:    Regular rate and rhythm, S1 and S2 normal, no murmur, rub   or gallop  Abdomen:     Soft, non-tender, bowel sounds active all four quadrants,    no masses, no organomegaly  Genitalia:    Normal male without lesion, masses,discharge or tenderness  Rectal:    Deferred due to young age  Extremities:   Extremities normal, atraumatic, no cyanosis or edema  Pulses:   2+ and symmetric all extremities  Skin:   Skin color, texture,  turgor normal, no rashes or lesions  Lymph nodes:   Cervical, supraclavicular, and axillary nodes normal  Neurologic:   CNII-XII intact. Normal strength, sensation and reflexes      throughout          Assessment & Plan:

## 2014-09-08 NOTE — Assessment & Plan Note (Signed)
Pt's PE WNL.  Encouraged smoking cessation.  Healthy diet, regular exercise.  Tdap updated.  Check labs.  Anticipatory guidance provided.

## 2014-09-08 NOTE — Progress Notes (Signed)
Pre visit review using our clinic review tool, if applicable. No additional management support is needed unless otherwise documented below in the visit note. 

## 2014-09-08 NOTE — Patient Instructions (Signed)
Follow up in 6 months to recheck BP and cholesterol We'll notify you of your lab results and make any changes if needed QUIT SMOKING!  You can do it! Try and make healthy food choices and get regular exercise Call with any questions or concerns Happy Early Birthday!!!

## 2014-09-09 ENCOUNTER — Telehealth: Payer: Self-pay | Admitting: Family Medicine

## 2014-09-09 LAB — HEPATIC FUNCTION PANEL
ALT: 44 U/L (ref 0–53)
AST: 24 U/L (ref 0–37)
Albumin: 4.1 g/dL (ref 3.5–5.2)
Alkaline Phosphatase: 43 U/L (ref 39–117)
Bilirubin, Direct: 0.1 mg/dL (ref 0.0–0.3)
Total Bilirubin: 0.6 mg/dL (ref 0.2–1.2)
Total Protein: 7.4 g/dL (ref 6.0–8.3)

## 2014-09-09 LAB — BASIC METABOLIC PANEL
BUN: 19 mg/dL (ref 6–23)
CO2: 23 mEq/L (ref 19–32)
Calcium: 9.4 mg/dL (ref 8.4–10.5)
Chloride: 103 mEq/L (ref 96–112)
Creatinine, Ser: 1.3 mg/dL (ref 0.4–1.5)
GFR: 60 mL/min — ABNORMAL LOW (ref >60.00)
Glucose, Bld: 115 mg/dL — ABNORMAL HIGH (ref 70–99)
Potassium: 4.1 mEq/L (ref 3.5–5.1)
Sodium: 137 mEq/L (ref 135–145)

## 2014-09-09 LAB — CBC WITH DIFFERENTIAL/PLATELET
Basophils Absolute: 0 10*3/uL (ref 0.0–0.1)
Basophils Relative: 0.4 % (ref 0.0–3.0)
Eosinophils Absolute: 0.3 10*3/uL (ref 0.0–0.7)
Eosinophils Relative: 3 % (ref 0.0–5.0)
HCT: 42.6 % (ref 39.0–52.0)
Hemoglobin: 14.4 g/dL (ref 13.0–17.0)
Lymphocytes Relative: 32.3 % (ref 12.0–46.0)
Lymphs Abs: 3.5 10*3/uL (ref 0.7–4.0)
MCHC: 33.7 g/dL (ref 30.0–36.0)
MCV: 90.7 fl (ref 78.0–100.0)
Monocytes Absolute: 0.7 10*3/uL (ref 0.1–1.0)
Monocytes Relative: 7 % (ref 3.0–12.0)
Neutro Abs: 6.2 10*3/uL (ref 1.4–7.7)
Neutrophils Relative %: 57.3 % (ref 43.0–77.0)
Platelets: 273 10*3/uL (ref 150.0–400.0)
RBC: 4.7 Mil/uL (ref 4.22–5.81)
RDW: 12.7 % (ref 11.5–15.5)
WBC: 10.8 10*3/uL — ABNORMAL HIGH (ref 4.0–10.5)

## 2014-09-09 LAB — LDL CHOLESTEROL, DIRECT: Direct LDL: 145.3 mg/dL

## 2014-09-09 LAB — LIPID PANEL
Cholesterol: 162 mg/dL (ref 0–200)
HDL: 30.9 mg/dL — ABNORMAL LOW (ref >39.00)
NonHDL: 131.1
Total CHOL/HDL Ratio: 5
Triglycerides: 268 mg/dL — ABNORMAL HIGH (ref 0.0–149.0)
VLDL: 53.6 mg/dL — ABNORMAL HIGH (ref 0.0–40.0)

## 2014-09-09 LAB — TSH: TSH: 3.29 u[IU]/mL (ref 0.35–4.50)

## 2014-09-09 LAB — PSA: PSA: 0.52 ng/mL (ref 0.10–4.00)

## 2014-09-09 NOTE — Telephone Encounter (Signed)
emmi emailed °

## 2014-10-05 ENCOUNTER — Encounter: Payer: Self-pay | Admitting: Family Medicine

## 2014-10-08 ENCOUNTER — Other Ambulatory Visit: Payer: Self-pay | Admitting: Family Medicine

## 2014-10-08 NOTE — Telephone Encounter (Signed)
Med filled.  

## 2014-10-14 ENCOUNTER — Other Ambulatory Visit: Payer: Self-pay | Admitting: Family Medicine

## 2014-10-14 NOTE — Telephone Encounter (Signed)
Med filled.  

## 2014-11-08 ENCOUNTER — Other Ambulatory Visit: Payer: Self-pay | Admitting: Family Medicine

## 2014-11-08 NOTE — Telephone Encounter (Signed)
Medication Detail      Disp Refills Start End     methocarbamol (ROBAXIN) 500 MG tablet 45 tablet 1 08/16/2014     Sig: TAKE 1 TABLET BY MOUTH 3 TIMES DAILY.    E-Prescribing Status: Receipt confirmed by pharmacy (08/16/2014 12:11 PM EDT)    Medication Detail      Disp Refills Start End     valsartan (DIOVAN) 160 MG tablet 30 tablet 3 07/09/2014     Sig: TAKE 1 TABLET BY MOUTH DAILY.    E-Prescribing Status: Receipt confirmed by pharmacy (07/09/2014 9:17 AM EDT)      Last AEX: 10.28.15 F/U: 6-Mths. Refill sent per [facility] refill protocol/SLS

## 2014-12-13 ENCOUNTER — Other Ambulatory Visit: Payer: Self-pay | Admitting: Family Medicine

## 2014-12-13 NOTE — Telephone Encounter (Signed)
Med filled.  

## 2014-12-18 ENCOUNTER — Other Ambulatory Visit: Payer: Self-pay | Admitting: Family Medicine

## 2014-12-20 NOTE — Telephone Encounter (Signed)
Med filled.  

## 2015-01-12 ENCOUNTER — Other Ambulatory Visit: Payer: Self-pay | Admitting: Family Medicine

## 2015-01-12 NOTE — Telephone Encounter (Signed)
Med filled.  

## 2015-03-10 ENCOUNTER — Encounter: Payer: Self-pay | Admitting: Family Medicine

## 2015-03-10 ENCOUNTER — Ambulatory Visit (INDEPENDENT_AMBULATORY_CARE_PROVIDER_SITE_OTHER): Payer: BC Managed Care – PPO | Admitting: Family Medicine

## 2015-03-10 VITALS — BP 110/80 | HR 69 | Temp 98.0°F | Resp 16 | Wt 262.1 lb

## 2015-03-10 DIAGNOSIS — I5022 Chronic systolic (congestive) heart failure: Secondary | ICD-10-CM | POA: Diagnosis not present

## 2015-03-10 DIAGNOSIS — E785 Hyperlipidemia, unspecified: Secondary | ICD-10-CM

## 2015-03-10 DIAGNOSIS — M5030 Other cervical disc degeneration, unspecified cervical region: Secondary | ICD-10-CM | POA: Diagnosis not present

## 2015-03-10 LAB — CBC WITH DIFFERENTIAL/PLATELET
Basophils Absolute: 0 10*3/uL (ref 0.0–0.1)
Basophils Relative: 0.4 % (ref 0.0–3.0)
Eosinophils Absolute: 0.3 10*3/uL (ref 0.0–0.7)
Eosinophils Relative: 3.9 % (ref 0.0–5.0)
HCT: 39.8 % (ref 39.0–52.0)
Hemoglobin: 13.8 g/dL (ref 13.0–17.0)
Lymphocytes Relative: 34.1 % (ref 12.0–46.0)
Lymphs Abs: 3.1 10*3/uL (ref 0.7–4.0)
MCHC: 34.7 g/dL (ref 30.0–36.0)
MCV: 89.3 fl (ref 78.0–100.0)
Monocytes Absolute: 0.5 10*3/uL (ref 0.1–1.0)
Monocytes Relative: 5.4 % (ref 3.0–12.0)
Neutro Abs: 5.1 10*3/uL (ref 1.4–7.7)
Neutrophils Relative %: 56.2 % (ref 43.0–77.0)
Platelets: 245 10*3/uL (ref 150.0–400.0)
RBC: 4.46 Mil/uL (ref 4.22–5.81)
RDW: 12.5 % (ref 11.5–15.5)
WBC: 9 10*3/uL (ref 4.0–10.5)

## 2015-03-10 LAB — LIPID PANEL
Cholesterol: 137 mg/dL (ref 0–200)
HDL: 30.1 mg/dL — ABNORMAL LOW (ref >39.00)
NonHDL: 106.9
Total CHOL/HDL Ratio: 5
Triglycerides: 214 mg/dL — ABNORMAL HIGH (ref 0.0–149.0)
VLDL: 42.8 mg/dL — ABNORMAL HIGH (ref 0.0–40.0)

## 2015-03-10 LAB — HEPATIC FUNCTION PANEL
ALT: 34 U/L (ref 0–53)
AST: 19 U/L (ref 0–37)
Albumin: 4.4 g/dL (ref 3.5–5.2)
Alkaline Phosphatase: 40 U/L (ref 39–117)
Bilirubin, Direct: 0.1 mg/dL (ref 0.0–0.3)
Total Bilirubin: 0.4 mg/dL (ref 0.2–1.2)
Total Protein: 6.8 g/dL (ref 6.0–8.3)

## 2015-03-10 LAB — BASIC METABOLIC PANEL
BUN: 13 mg/dL (ref 6–23)
CO2: 27 mEq/L (ref 19–32)
Calcium: 9.4 mg/dL (ref 8.4–10.5)
Chloride: 103 mEq/L (ref 96–112)
Creatinine, Ser: 1.23 mg/dL (ref 0.40–1.50)
GFR: 66.1 mL/min (ref >60.00)
Glucose, Bld: 170 mg/dL — ABNORMAL HIGH (ref 70–99)
Potassium: 3.7 mEq/L (ref 3.5–5.1)
Sodium: 135 mEq/L (ref 135–145)

## 2015-03-10 LAB — LDL CHOLESTEROL, DIRECT: Direct LDL: 84 mg/dL

## 2015-03-10 MED ORDER — TRAMADOL HCL 50 MG PO TABS: 50.0000 mg | ORAL_TABLET | Freq: Three times a day (TID) | ORAL | Status: DC | PRN

## 2015-03-10 MED ORDER — TRIAMCINOLONE ACETONIDE 0.025 % EX OINT: 1.0000 "application " | TOPICAL_OINTMENT | Freq: Two times a day (BID) | CUTANEOUS | Status: DC

## 2015-03-10 MED ORDER — ZOLPIDEM TARTRATE 5 MG PO TABS: 5.0000 mg | ORAL_TABLET | Freq: Every evening | ORAL | Status: DC | PRN

## 2015-03-10 MED ORDER — METHOCARBAMOL 500 MG PO TABS: ORAL_TABLET | ORAL | Status: DC

## 2015-03-10 MED ORDER — VALSARTAN 160 MG PO TABS: 160.0000 mg | ORAL_TABLET | Freq: Every day | ORAL | Status: DC

## 2015-03-10 NOTE — Progress Notes (Signed)
   Subjective:    Patient ID: Jason Floyd, male    DOB: 1966/03/17, 49 y.o.   MRN: [id number]  HPI CHF- chronic problem, on Diovan and Coreg daily.  Excellent control.  Denies CP, SOB, HAs, visual changes, edema  Hyperlipidemia- chronic problem, on Lipitor and fenofibrate daily.  No abd pain, N/V, myalgias  Back pain- chronic problem, pt asking for refill on Robaxin and Tramadol.  sxs are adequately controlled on medications.  Review of Systems For ROS see HPI     Objective:   Physical Exam  Constitutional: He is oriented to person, place, and time. He appears well-developed and well-nourished. No distress.  HENT:  Head: Normocephalic and atraumatic.  Eyes: Conjunctivae and EOM are normal. Pupils are equal, round, and reactive to light.  Neck: Normal range of motion. Neck supple. No thyromegaly present.  Cardiovascular: Normal rate, regular rhythm, normal heart sounds and intact distal pulses.   No murmur heard. Pulmonary/Chest: Effort normal and breath sounds normal. No respiratory distress.  Abdominal: Soft. Bowel sounds are normal. He exhibits no distension.  Musculoskeletal: He exhibits no edema.  Lymphadenopathy:    He has no cervical adenopathy.  Neurological: He is alert and oriented to person, place, and time. No cranial nerve deficit.  Skin: Skin is warm and dry.  Psychiatric: He has a normal mood and affect. His behavior is normal.  Vitals reviewed.         Assessment & Plan:

## 2015-03-10 NOTE — Assessment & Plan Note (Signed)
Chronic problem, tolerating statin and fenofibrate w/o difficulty.  Check labs.  Adjust meds prn

## 2015-03-10 NOTE — Patient Instructions (Signed)
Schedule your complete physical in 6 months We'll notify you of your lab results and make any changes if needed Try and make healthy food choices and get regular exercise Call with any questions or concerns Happy Spring!!! 

## 2015-03-10 NOTE — Assessment & Plan Note (Signed)
Chronic problem.  Pt reports sxs are adequately controlled w/ Robaxin and Tramadol.  Refills provided.

## 2015-03-10 NOTE — Assessment & Plan Note (Signed)
Chronic problem.  Asymptomatic.  BP well controlled.  On statin.  Check labs.  No anticipated med changes.

## 2015-03-10 NOTE — Progress Notes (Signed)
Pre visit review using our clinic review tool, if applicable. No additional management support is needed unless otherwise documented below in the visit note. 

## 2015-03-11 ENCOUNTER — Other Ambulatory Visit (INDEPENDENT_AMBULATORY_CARE_PROVIDER_SITE_OTHER): Payer: BC Managed Care – PPO

## 2015-03-11 ENCOUNTER — Other Ambulatory Visit: Payer: Self-pay | Admitting: General Practice

## 2015-03-11 DIAGNOSIS — R7309 Other abnormal glucose: Secondary | ICD-10-CM

## 2015-03-11 DIAGNOSIS — E119 Type 2 diabetes mellitus without complications: Secondary | ICD-10-CM

## 2015-03-11 DIAGNOSIS — R7303 Prediabetes: Secondary | ICD-10-CM

## 2015-03-11 LAB — HEMOGLOBIN A1C: Hgb A1c MFr Bld: 7.2 % — ABNORMAL HIGH (ref 4.6–6.5)

## 2015-03-11 MED ORDER — METFORMIN HCL ER 500 MG PO TB24: 500.0000 mg | ORAL_TABLET | Freq: Every day | ORAL | Status: DC

## 2015-05-09 ENCOUNTER — Other Ambulatory Visit: Payer: Self-pay

## 2015-05-12 ENCOUNTER — Other Ambulatory Visit: Payer: Self-pay | Admitting: Family Medicine

## 2015-05-12 NOTE — Telephone Encounter (Signed)
Med filled and faxed.  

## 2015-05-12 NOTE — Telephone Encounter (Signed)
Last OV 03/10/15 Robaxin last filled 03/10/15 #45 with 1 Tramadol last filled 03/10/15 #45 with 0

## 2015-05-27 ENCOUNTER — Other Ambulatory Visit: Payer: Self-pay | Admitting: Family Medicine

## 2015-05-27 NOTE — Telephone Encounter (Signed)
Med filled.  

## 2015-06-21 ENCOUNTER — Other Ambulatory Visit: Payer: Self-pay | Admitting: Family Medicine

## 2015-06-22 NOTE — Telephone Encounter (Signed)
Medication filled to pharmacy as requested.   

## 2015-06-22 NOTE — Telephone Encounter (Signed)
Last OV 03/10/15  Tramadol last filled 05/12/15 #45 with 0

## 2015-07-07 ENCOUNTER — Encounter: Payer: Self-pay | Admitting: Family Medicine

## 2015-07-07 ENCOUNTER — Ambulatory Visit (INDEPENDENT_AMBULATORY_CARE_PROVIDER_SITE_OTHER): Payer: BC Managed Care – PPO | Admitting: Family Medicine

## 2015-07-07 VITALS — BP 112/76 | HR 75 | Temp 98.0°F | Resp 16 | Ht 73.0 in | Wt 244.5 lb

## 2015-07-07 DIAGNOSIS — K644 Residual hemorrhoidal skin tags: Secondary | ICD-10-CM | POA: Insufficient documentation

## 2015-07-07 DIAGNOSIS — K648 Other hemorrhoids: Secondary | ICD-10-CM | POA: Diagnosis not present

## 2015-07-07 DIAGNOSIS — E119 Type 2 diabetes mellitus without complications: Secondary | ICD-10-CM

## 2015-07-07 DIAGNOSIS — E118 Type 2 diabetes mellitus with unspecified complications: Secondary | ICD-10-CM | POA: Insufficient documentation

## 2015-07-07 LAB — BASIC METABOLIC PANEL
BUN: 20 mg/dL (ref 6–23)
CO2: 29 mEq/L (ref 19–32)
Calcium: 9.7 mg/dL (ref 8.4–10.5)
Chloride: 103 mEq/L (ref 96–112)
Creatinine, Ser: 1.11 mg/dL (ref 0.40–1.50)
GFR: 74.32 mL/min (ref >60.00)
Glucose, Bld: 127 mg/dL — ABNORMAL HIGH (ref 70–99)
Potassium: 4 mEq/L (ref 3.5–5.1)
Sodium: 139 mEq/L (ref 135–145)

## 2015-07-07 LAB — HEMOGLOBIN A1C: Hgb A1c MFr Bld: 6.3 % (ref 4.6–6.5)

## 2015-07-07 MED ORDER — HYDROCORTISONE ACE-PRAMOXINE 2.5-1 % RE CREA: 1.0000 "application " | TOPICAL_CREAM | Freq: Three times a day (TID) | RECTAL | Status: DC

## 2015-07-07 MED ORDER — DIBUCAINE 1 % EX OINT: TOPICAL_OINTMENT | Freq: Three times a day (TID) | CUTANEOUS | Status: DC | PRN

## 2015-07-07 NOTE — Assessment & Plan Note (Signed)
New dx for pt.  Started on Metformin after last visit.  Tolerating metformin w/o difficulty.  On ARB for renal protection.  Has lost 20 lbs since last visit.  Due for eye exam- referral entered.  Applauded his dietary changes and his regular exercise.  Check labs.  Adjust meds prn

## 2015-07-07 NOTE — Assessment & Plan Note (Signed)
New.  Unable to say for certain this is external hemorrhoid b/c exam in office was limited due to pt's pain level.  Will start Analpram, Nupercainal and urgently refer to general surgery for evaluation and tx.  Reviewed supportive care and red flags that should prompt return.  Pt expressed understanding and is in agreement w/ plan.

## 2015-07-07 NOTE — Patient Instructions (Signed)
Follow up in 3-4 months to recheck diabetes and cholesterol We'll notify you of your lab results and make any changes if needed We'll call you with your eye exam and surgery appt Use the Analpram to heal the hemorrhoid and the Nupercainal for the pain Keep up the good work!  You look great! Call with any questions or concerns Hang in there!

## 2015-07-07 NOTE — Progress Notes (Signed)
   Subjective:    Patient ID: Jason Floyd, male    DOB: Mar 17, 1966, 49 y.o.   MRN: [id number]  HPI DM- new dx for pt.  Currently on Metformin.  On ARB for renal protection.  Due for eye exam, foot exam.  Pt has made diet changes, exercising regularly- has lost almost 20 lbs!!!  No CP, SOB, HAs, visual changes, abd pain, N/V, numbness/tingling hands/feet.  Denies symptomatic lows.  Hemorrhoid- 1-2 weeks ago developed hemorrhoid.  Not bleeding but 'gets very very painful at times'.  Unable to sit comfortably.  Using tramadol w/o relief.   Review of Systems For ROS see HPI     Objective:   Physical Exam  Constitutional: He is oriented to person, place, and time. He appears well-developed and well-nourished. No distress.  HENT:  Head: Normocephalic and atraumatic.  Eyes: Conjunctivae and EOM are normal. Pupils are equal, round, and reactive to light.  Neck: Normal range of motion. Neck supple. No thyromegaly present.  Cardiovascular: Normal rate, regular rhythm, normal heart sounds and intact distal pulses.   No murmur heard. Pulmonary/Chest: Effort normal and breath sounds normal. No respiratory distress.  Abdominal: Soft. Bowel sounds are normal. He exhibits no distension.  Genitourinary: Rectal exam shows tenderness (pt is exquisitely tender on DRE 'at the 10 or 11 o'clock position'.  unable to determine if mass/abscess present due to pt's pain).  Musculoskeletal: He exhibits no edema.  Lymphadenopathy:    He has no cervical adenopathy.  Neurological: He is alert and oriented to person, place, and time. No cranial nerve deficit.  Skin: Skin is warm and dry.  Psychiatric: He has a normal mood and affect. His behavior is normal.          Assessment & Plan:

## 2015-07-07 NOTE — Progress Notes (Signed)
Pre visit review using our clinic review tool, if applicable. No additional management support is needed unless otherwise documented below in the visit note. 

## 2015-07-11 ENCOUNTER — Encounter: Payer: Self-pay | Admitting: Family Medicine

## 2015-07-15 LAB — HM DIABETES EYE EXAM

## 2015-07-19 ENCOUNTER — Other Ambulatory Visit: Payer: Self-pay | Admitting: Surgery

## 2015-07-21 ENCOUNTER — Other Ambulatory Visit: Payer: Self-pay | Admitting: Family Medicine

## 2015-07-21 NOTE — Telephone Encounter (Signed)
Medication filled to pharmacy as requested.   

## 2015-07-21 NOTE — Telephone Encounter (Signed)
Last OV 07/07/15 Tramadol last filled 06/22/15 #45 with 0

## 2015-08-23 ENCOUNTER — Other Ambulatory Visit: Payer: Self-pay | Admitting: Family Medicine

## 2015-08-23 NOTE — Telephone Encounter (Signed)
Medication filled to pharmacy as requested.   

## 2015-08-23 NOTE — Telephone Encounter (Signed)
Last OV 07/07/15 Robaxin last filled 05/27/15 #45 with 1 Tramadol last filled 07/21/15 #45 with 0

## 2015-08-31 ENCOUNTER — Other Ambulatory Visit: Payer: Self-pay | Admitting: General Practice

## 2015-08-31 ENCOUNTER — Other Ambulatory Visit: Payer: Self-pay | Admitting: Family Medicine

## 2015-08-31 MED ORDER — ZOLPIDEM TARTRATE 5 MG PO TABS: 5.0000 mg | ORAL_TABLET | Freq: Every evening | ORAL | Status: DC | PRN

## 2015-08-31 NOTE — Telephone Encounter (Signed)
Med denied, already faxed today.

## 2015-08-31 NOTE — Telephone Encounter (Signed)
Last OV 07-07-15 Zolpidem last filled 03-10-15 #30 with 3

## 2015-08-31 NOTE — Telephone Encounter (Signed)
Medication filled to pharmacy as requested.   

## 2015-09-13 ENCOUNTER — Telehealth: Payer: Self-pay | Admitting: Behavioral Health

## 2015-09-13 ENCOUNTER — Encounter: Payer: Self-pay | Admitting: Behavioral Health

## 2015-09-13 NOTE — Telephone Encounter (Signed)
Pre-Visit Call completed with patient and chart updated.   Pre-Visit Info documented in Specialty Comments under [employer].    

## 2015-09-14 ENCOUNTER — Encounter: Payer: Self-pay | Admitting: Family Medicine

## 2015-09-14 ENCOUNTER — Encounter: Payer: Self-pay | Admitting: Gastroenterology

## 2015-09-14 ENCOUNTER — Ambulatory Visit (INDEPENDENT_AMBULATORY_CARE_PROVIDER_SITE_OTHER): Payer: BC Managed Care – PPO | Admitting: Family Medicine

## 2015-09-14 ENCOUNTER — Other Ambulatory Visit: Payer: Self-pay | Admitting: Family Medicine

## 2015-09-14 VITALS — BP 118/78 | HR 66 | Temp 98.0°F | Resp 16 | Ht 73.0 in | Wt 241.0 lb

## 2015-09-14 DIAGNOSIS — Z1211 Encounter for screening for malignant neoplasm of colon: Secondary | ICD-10-CM

## 2015-09-14 DIAGNOSIS — Z0000 Encounter for general adult medical examination without abnormal findings: Secondary | ICD-10-CM

## 2015-09-14 DIAGNOSIS — M109 Gout, unspecified: Secondary | ICD-10-CM | POA: Diagnosis not present

## 2015-09-14 DIAGNOSIS — D72829 Elevated white blood cell count, unspecified: Secondary | ICD-10-CM

## 2015-09-14 LAB — CBC WITH DIFFERENTIAL/PLATELET
Basophils Absolute: 0 10*3/uL (ref 0.0–0.1)
Basophils Relative: 0.4 % (ref 0.0–3.0)
Eosinophils Absolute: 0.4 10*3/uL (ref 0.0–0.7)
Eosinophils Relative: 3.2 % (ref 0.0–5.0)
HCT: 41.9 % (ref 39.0–52.0)
Hemoglobin: 14.1 g/dL (ref 13.0–17.0)
Lymphocytes Relative: 27.5 % (ref 12.0–46.0)
Lymphs Abs: 3.3 10*3/uL (ref 0.7–4.0)
MCHC: 33.7 g/dL (ref 30.0–36.0)
MCV: 91.7 fl (ref 78.0–100.0)
Monocytes Absolute: 0.8 10*3/uL (ref 0.1–1.0)
Monocytes Relative: 6.5 % (ref 3.0–12.0)
Neutro Abs: 7.6 10*3/uL (ref 1.4–7.7)
Neutrophils Relative %: 62.4 % (ref 43.0–77.0)
Platelets: 279 10*3/uL (ref 150.0–400.0)
RBC: 4.57 Mil/uL (ref 4.22–5.81)
RDW: 12.8 % (ref 11.5–15.5)
WBC: 12.2 10*3/uL — ABNORMAL HIGH (ref 4.0–10.5)

## 2015-09-14 LAB — LIPID PANEL
Cholesterol: 143 mg/dL (ref 0–200)
HDL: 36.8 mg/dL — ABNORMAL LOW (ref >39.00)
LDL Cholesterol: 83 mg/dL (ref 0–99)
NonHDL: 106.28
Total CHOL/HDL Ratio: 4
Triglycerides: 117 mg/dL (ref 0.0–149.0)
VLDL: 23.4 mg/dL (ref 0.0–40.0)

## 2015-09-14 LAB — HEPATIC FUNCTION PANEL
ALT: 17 U/L (ref 0–53)
AST: 13 U/L (ref 0–37)
Albumin: 4.5 g/dL (ref 3.5–5.2)
Alkaline Phosphatase: 40 U/L (ref 39–117)
Bilirubin, Direct: 0.1 mg/dL (ref 0.0–0.3)
Total Bilirubin: 0.6 mg/dL (ref 0.2–1.2)
Total Protein: 7.2 g/dL (ref 6.0–8.3)

## 2015-09-14 LAB — BASIC METABOLIC PANEL
BUN: 23 mg/dL (ref 6–23)
CO2: 30 mEq/L (ref 19–32)
Calcium: 9.8 mg/dL (ref 8.4–10.5)
Chloride: 102 mEq/L (ref 96–112)
Creatinine, Ser: 1.15 mg/dL (ref 0.40–1.50)
GFR: 71.29 mL/min (ref >60.00)
Glucose, Bld: 119 mg/dL — ABNORMAL HIGH (ref 70–99)
Potassium: 4.7 mEq/L (ref 3.5–5.1)
Sodium: 137 mEq/L (ref 135–145)

## 2015-09-14 LAB — TSH: TSH: 2.7 u[IU]/mL (ref 0.35–4.50)

## 2015-09-14 LAB — PSA: PSA: 0.51 ng/mL (ref 0.10–4.00)

## 2015-09-14 LAB — URIC ACID: Uric Acid, Serum: 5.4 mg/dL (ref 4.0–7.8)

## 2015-09-14 MED ORDER — INDOMETHACIN 50 MG PO CAPS: 50.0000 mg | ORAL_CAPSULE | Freq: Three times a day (TID) | ORAL | Status: DC

## 2015-09-14 MED ORDER — ALLOPURINOL 300 MG PO TABS: 450.0000 mg | ORAL_TABLET | Freq: Every day | ORAL | Status: DC

## 2015-09-14 NOTE — Progress Notes (Signed)
   Subjective:    Patient ID: Jason Floyd, male    DOB: 02/11/1966, 49 y.o.   MRN: [id number]  HPI CPE- pt now 65, due for colonoscopy.  Pt has lost another 5 lbs.  Gout- chronic problem.  Pt reports 2 nights ago developed pain, redness and swelling of R great toe.  Pt was previously taking Allopurinol 500mg  and feels he needs to go back up as this is 2 episode since decreasing medication.  Pt is unable to tolerate colchicine.  Has taken indomethacin previously.   Review of Systems Patient reports no vision/hearing changes, anorexia, fever ,adenopathy, persistant/recurrent hoarseness, swallowing issues, chest pain, palpitations, edema, persistant/recurrent cough, hemoptysis, dyspnea (rest,exertional, paroxysmal nocturnal), gastrointestinal  bleeding (melena, rectal bleeding), abdominal pain, excessive heart burn, GU symptoms (dysuria, hematuria, voiding/incontinence issues) syncope, focal weakness, memory loss, numbness & tingling, skin/hair/nail changes, depression, anxiety, abnormal bruising/bleeding, musculoskeletal symptoms/signs.     Objective:   Physical Exam General Appearance:    Alert, cooperative, no distress, appears stated age  Head:    Normocephalic, without obvious abnormality, atraumatic  Eyes:    PERRL, conjunctiva/corneas clear, EOM's intact, fundi    benign, both eyes       Ears:    Normal TM's and external ear canals, both ears  Nose:   Nares normal, septum midline, mucosa normal, no drainage   or sinus tenderness  Throat:   Lips, mucosa, and tongue normal; teeth and gums normal  Neck:   Supple, symmetrical, trachea midline, no adenopathy;       thyroid:  No enlargement/tenderness/nodules  Back:     Symmetric, no curvature, ROM normal, no CVA tenderness  Lungs:     Clear to auscultation bilaterally, respirations unlabored  Chest wall:    No tenderness or deformity  Heart:    Regular rate and rhythm, S1 and S2 normal, no murmur, rub   or gallop  Abdomen:     Soft,  non-tender, bowel sounds active all four quadrants,    no masses, no organomegaly  Genitalia:    Deferred at pt's request  Rectal:    Deferred to recent surgery exam for anal fissure  Extremities:   Extremities normal, atraumatic, no cyanosis.  R great toe w/ mild swelling, some erythema, TTP  Pulses:   2+ and symmetric all extremities  Skin:   Skin color, texture, turgor normal, no rashes or lesions  Lymph nodes:   Cervical, supraclavicular, and axillary nodes normal  Neurologic:   CNII-XII intact. Normal strength, sensation and reflexes      throughout          Assessment & Plan:

## 2015-09-14 NOTE — Progress Notes (Signed)
Pre visit review using our clinic review tool, if applicable. No additional management support is needed unless otherwise documented below in the visit note. 

## 2015-09-14 NOTE — Assessment & Plan Note (Signed)
Pt's PE WNL w/ exception of current gout flare.  Due for colonoscopy- referral placed.  Pt declines flu shot.  Declines rectal exam due to recent eval by surgeon.  Check labs.  Anticipatory guidance provided.

## 2015-09-14 NOTE — Patient Instructions (Signed)
Follow up in 3 months to recheck sugar We'll notify you of your lab results and make any changes if needed Continue to work on healthy diet and regular exercise- you look great! Try and quit smoking! We'll call you with your GI appt for the colonoscopy Increase the Allopurinol to 450mg  daily (1.5 tabs of the 300mg - there is no longer a 500mg  tab) Take the Indomethacin (w/ food) for the current gout flair- start w/ 3x/day and rapidly decrease as pain improves Drink plenty of water Call with any questions or concerns If you want to join [place] at the new [place] office, any scheduled appointments will automatically transfer and we will see you at [address] [place] [address] [place], [place] [address] Happy Holidays!

## 2015-09-14 NOTE — Assessment & Plan Note (Signed)
Deteriorated.  Pt w/ current flare.  Start Indomethacin as this has worked well for him in the past.  Increase Allopurinol to 450mg  daily.  Reviewed need for low purine diet.  Check uric acid level.  Will follow.

## 2015-09-29 ENCOUNTER — Other Ambulatory Visit: Payer: Self-pay | Admitting: Family Medicine

## 2015-09-29 NOTE — Telephone Encounter (Signed)
Medication filled to pharmacy as requested.   

## 2015-09-29 NOTE — Telephone Encounter (Signed)
Last OV 09/24/15 Tramadol last filled 08/23/15 #45 with 0 Triamcinolone last filled 03/10/15

## 2015-10-10 ENCOUNTER — Other Ambulatory Visit: Payer: Self-pay | Admitting: Family Medicine

## 2015-10-10 NOTE — Telephone Encounter (Signed)
Medication filled to pharmacy as requested.   

## 2015-10-14 ENCOUNTER — Other Ambulatory Visit: Payer: Self-pay | Admitting: Family Medicine

## 2015-10-15 NOTE — Telephone Encounter (Signed)
Medication filled to pharmacy as requested.   

## 2015-10-29 ENCOUNTER — Other Ambulatory Visit: Payer: Self-pay | Admitting: Family Medicine

## 2015-10-31 NOTE — Telephone Encounter (Signed)
Medication filled to pharmacy as requested.   

## 2015-10-31 NOTE — Telephone Encounter (Signed)
Last OV 09-14-15 Robaxin last filled 08-23-15 #45 with 1 Tramadol last filled 09-29-15 #45 with 0

## 2015-11-15 ENCOUNTER — Ambulatory Visit (AMBULATORY_SURGERY_CENTER): Payer: Self-pay

## 2015-11-15 VITALS — Ht 74.0 in | Wt 246.0 lb

## 2015-11-15 DIAGNOSIS — Z1211 Encounter for screening for malignant neoplasm of colon: Secondary | ICD-10-CM

## 2015-11-15 MED ORDER — SUPREP BOWEL PREP KIT 17.5-3.13-1.6 GM/177ML PO SOLN: 1.0000 | Freq: Once | ORAL | Status: DC

## 2015-11-15 NOTE — Progress Notes (Signed)
No allergies to eggs or soy No diet/weight loss meds No home oxygen No past problems with anesthesia  Has email and internet; registered for emmi 

## 2015-11-28 ENCOUNTER — Other Ambulatory Visit: Payer: Self-pay | Admitting: Family Medicine

## 2015-11-28 NOTE — Telephone Encounter (Signed)
Last OV: 09/14/2015 Last filled: 10/31/2015, #45, 0 RF UDS: 09/09/2014

## 2015-11-29 ENCOUNTER — Ambulatory Visit (AMBULATORY_SURGERY_CENTER): Payer: BC Managed Care – PPO | Admitting: Gastroenterology

## 2015-11-29 ENCOUNTER — Encounter: Payer: Self-pay | Admitting: Gastroenterology

## 2015-11-29 ENCOUNTER — Encounter: Payer: Self-pay | Admitting: Family Medicine

## 2015-11-29 VITALS — BP 119/71 | HR 60 | Temp 96.3°F | Resp 19 | Ht 74.0 in | Wt 246.0 lb

## 2015-11-29 DIAGNOSIS — Z1211 Encounter for screening for malignant neoplasm of colon: Secondary | ICD-10-CM | POA: Diagnosis not present

## 2015-11-29 LAB — GLUCOSE, CAPILLARY
Glucose-Capillary: 113 mg/dL — ABNORMAL HIGH (ref 65–99)
Glucose-Capillary: 118 mg/dL — ABNORMAL HIGH (ref 65–99)

## 2015-11-29 MED ORDER — SODIUM CHLORIDE 0.9 % IV SOLN: 500.0000 mL | INTRAVENOUS | Status: DC

## 2015-11-29 NOTE — Progress Notes (Signed)
Patient admitted to marijuana usage on Saturday of this week.

## 2015-11-29 NOTE — Op Note (Signed)
[facility] [address] [place], [address]   COLONOSCOPY PROCEDURE REPORT  PATIENT: Jason, Floyd  MR#: [id number] BIRTHDATE: [birth year] , 49  yrs. old GENDER: male ENDOSCOPIST: Rachael Fee, MD REFERRED EG:[id number] Beverely Low, M.D. PROCEDURE DATE:  11/29/2015 PROCEDURE:   Colonoscopy, screening First Screening Colonoscopy - Avg.  risk and is 50 yrs.  old or older Yes.  Prior Negative Screening - Now for repeat screening. N/A  History of Adenoma - Now for follow-up colonoscopy & has been > or = to 3 yrs.  N/A  Recommend repeat exam, <10 yrs? No ASA CLASS:   Class II INDICATIONS:Screening for colonic neoplasia and Colorectal Neoplasm Risk Assessment for this procedure is average risk. MEDICATIONS: Monitored anesthesia care and Propofol 350 mg IV  DESCRIPTION OF PROCEDURE:   After the risks benefits and alternatives of the procedure were thoroughly explained, informed consent was obtained.  The digital rectal exam revealed no abnormalities of the rectum.   The [id number]  endoscope was introduced through the anus and advanced to the cecum, which was identified by both the appendix and ileocecal valve. No adverse events experienced.   The quality of the prep was excellent.  The instrument was then slowly withdrawn as the colon was fully examined. Estimated blood loss is zero unless otherwise noted in this procedure report.   COLON FINDINGS: A normal appearing cecum, ileocecal valve, and appendiceal orifice were identified.  The ascending, transverse, descending, sigmoid colon, and rectum appeared unremarkable. Retroflexed views revealed no abnormalities. The time to cecum = 2.5 Withdrawal time = 11.8   The scope was withdrawn and the procedure completed. COMPLICATIONS: There were no immediate complications.  ENDOSCOPIC IMPRESSION: Normal colonoscopy No polyps or cancers  RECOMMENDATIONS: You should continue to follow colorectal cancer  screening guidelines for "routine risk" patients with a repeat colonoscopy in 10 years.   eSigned:  Rachael Fee, MD 11/29/2015 10:20 AM

## 2015-11-29 NOTE — Patient Instructions (Signed)
Discharge instructions given. Normal exam. Resume previous medications. YOU HAD AN ENDOSCOPIC PROCEDURE TODAY AT THE  ENDOSCOPY CENTER:   Refer to the procedure report that was given to you for any specific questions about what was found during the examination.  If the procedure report does not answer your questions, please call your gastroenterologist to clarify.  If you requested that your care partner not be given the details of your procedure findings, then the procedure report has been included in a sealed envelope for you to review at your convenience later.  YOU SHOULD EXPECT: Some feelings of bloating in the abdomen. Passage of more gas than usual.  Walking can help get rid of the air that was put into your GI tract during the procedure and reduce the bloating. If you had a lower endoscopy (such as a colonoscopy or flexible sigmoidoscopy) you may notice spotting of blood in your stool or on the toilet paper. If you underwent a bowel prep for your procedure, you may not have a normal bowel movement for a few days.  Please Note:  You might notice some irritation and congestion in your nose or some drainage.  This is from the oxygen used during your procedure.  There is no need for concern and it should clear up in a day or so.  SYMPTOMS TO REPORT IMMEDIATELY:   Following lower endoscopy (colonoscopy or flexible sigmoidoscopy):  Excessive amounts of blood in the stool  Significant tenderness or worsening of abdominal pains  Swelling of the abdomen that is new, acute  Fever of 100F or higher   For urgent or emergent issues, a gastroenterologist can be reached at any hour by calling [phone].   DIET: Your first meal following the procedure should be a small meal and then it is ok to progress to your normal diet. Heavy or fried foods are harder to digest and may make you feel nauseous or bloated.  Likewise, meals heavy in dairy and vegetables can increase bloating.  Drink plenty  of fluids but you should avoid alcoholic beverages for 24 hours.  ACTIVITY:  You should plan to take it easy for the rest of today and you should NOT DRIVE or use heavy machinery until tomorrow (because of the sedation medicines used during the test).    FOLLOW UP: Our staff will call the number listed on your records the next business day following your procedure to check on you and address any questions or concerns that you may have regarding the information given to you following your procedure. If we do not reach you, we will leave a message.  However, if you are feeling well and you are not experiencing any problems, there is no need to return our call.  We will assume that you have returned to your regular daily activities without incident.  If any biopsies were taken you will be contacted by phone or by letter within the next 1-3 weeks.  Please call us at [phone] if you have not heard about the biopsies in 3 weeks.    SIGNATURES/CONFIDENTIALITY: You and/or your care partner have signed paperwork which will be entered into your electronic medical record.  These signatures attest to the fact that that the information above on your After Visit Summary has been reviewed and is understood.  Full responsibility of the confidentiality of this discharge information lies with you and/or your care-partner. 

## 2015-11-29 NOTE — Progress Notes (Signed)
A/ox3 pleased with MAC, report to Celia RN 

## 2015-11-29 NOTE — Progress Notes (Signed)
Rx for Tramadol faxed to Pharmacy per patient request.

## 2015-11-30 ENCOUNTER — Telehealth: Payer: Self-pay

## 2015-11-30 NOTE — Telephone Encounter (Signed)
  Follow up Call-  Call back number 11/29/2015  Post procedure Call Back phone  # [phone]  Permission to leave phone message Yes     Patient questions:  Do you have a fever, pain , or abdominal swelling? No. Pain Score  0 *  Have you tolerated food without any problems? Yes.    Have you been able to return to your normal activities? Yes.    Do you have any questions about your discharge instructions: Diet   No. Medications  No. Follow up visit  No.  Do you have questions or concerns about your Care? No.  Actions: * If pain score is 4 or above: No action needed, pain <4.

## 2015-12-05 ENCOUNTER — Other Ambulatory Visit: Payer: Self-pay | Admitting: Family Medicine

## 2015-12-05 NOTE — Telephone Encounter (Signed)
Medication filled to pharmacy as requested.   

## 2015-12-15 ENCOUNTER — Ambulatory Visit (INDEPENDENT_AMBULATORY_CARE_PROVIDER_SITE_OTHER): Payer: BC Managed Care – PPO | Admitting: Family Medicine

## 2015-12-15 ENCOUNTER — Encounter: Payer: Self-pay | Admitting: Family Medicine

## 2015-12-15 VITALS — BP 116/76 | HR 64 | Temp 97.2°F | Ht 74.0 in | Wt 246.4 lb

## 2015-12-15 DIAGNOSIS — E119 Type 2 diabetes mellitus without complications: Secondary | ICD-10-CM | POA: Diagnosis not present

## 2015-12-15 LAB — BASIC METABOLIC PANEL
BUN: 23 mg/dL (ref 6–23)
CO2: 26 mEq/L (ref 19–32)
Calcium: 9.8 mg/dL (ref 8.4–10.5)
Chloride: 105 mEq/L (ref 96–112)
Creatinine, Ser: 1.23 mg/dL (ref 0.40–1.50)
GFR: 65.9 mL/min (ref >60.00)
Glucose, Bld: 146 mg/dL — ABNORMAL HIGH (ref 70–99)
Potassium: 4 mEq/L (ref 3.5–5.1)
Sodium: 139 mEq/L (ref 135–145)

## 2015-12-15 LAB — HEMOGLOBIN A1C: Hgb A1c MFr Bld: 6.5 % (ref 4.6–6.5)

## 2015-12-15 NOTE — Assessment & Plan Note (Signed)
Relatively new dx for pt.  Tolerating metformin w/o difficulty.  On ARB for renal protection.  UTD on eye exam and foot exam.  Stressed need for low carb diet and regular exercise.  Check labs.  Adjust meds prn

## 2015-12-15 NOTE — Patient Instructions (Signed)
Follow up in 3-4 months to recheck sugar and cholesterol We'll notify you of your lab results and make any changes if needed Keep up the good work on healthy diet and regular exercise- you look great!! Call with any questions or concerns Happy New Year!!! 

## 2015-12-15 NOTE — Progress Notes (Signed)
   Subjective:    Patient ID: Jason Floyd, male    DOB: 1966/05/22, 50 y.o.   MRN: [id number]  HPI Diabetes- relatively new dx for pt.  On Metformin daily.  UTD on foot exam, eye exam.  On ARB for renal protection.  Exercising regularly- wants to lose another 40 lbs.  Denies symptomatic lows.  No numbness/tingling of hands/feet.  No CP, SOB, HAs, visual changes, edema.   Review of Systems For ROS see HPI     Objective:   Physical Exam  Constitutional: He is oriented to person, place, and time. He appears well-developed and well-nourished. No distress.  HENT:  Head: Normocephalic and atraumatic.  Eyes: Conjunctivae and EOM are normal. Pupils are equal, round, and reactive to light.  Neck: Normal range of motion. Neck supple. No thyromegaly present.  Cardiovascular: Normal rate, regular rhythm, normal heart sounds and intact distal pulses.   No murmur heard. Pulmonary/Chest: Effort normal and breath sounds normal. No respiratory distress.  Abdominal: Soft. Bowel sounds are normal. He exhibits no distension.  Musculoskeletal: He exhibits no edema.  Lymphadenopathy:    He has no cervical adenopathy.  Neurological: He is alert and oriented to person, place, and time. No cranial nerve deficit.  Skin: Skin is warm and dry.  Psychiatric: He has a normal mood and affect. His behavior is normal.  Vitals reviewed.         Assessment & Plan:

## 2015-12-15 NOTE — Progress Notes (Signed)
Pre visit review using our clinic review tool, if applicable. No additional management support is needed unless otherwise documented below in the visit note. 

## 2016-01-02 ENCOUNTER — Other Ambulatory Visit: Payer: Self-pay

## 2016-01-02 ENCOUNTER — Other Ambulatory Visit: Payer: Self-pay | Admitting: Family Medicine

## 2016-01-02 NOTE — Telephone Encounter (Signed)
Medication filled to pharmacy as requested.   

## 2016-01-03 MED ORDER — TRAMADOL HCL 50 MG PO TABS: ORAL_TABLET | ORAL | Status: DC

## 2016-01-27 ENCOUNTER — Other Ambulatory Visit: Payer: Self-pay | Admitting: Family Medicine

## 2016-01-27 MED ORDER — TRAMADOL HCL 50 MG PO TABS: ORAL_TABLET | ORAL | Status: DC

## 2016-01-27 NOTE — Telephone Encounter (Signed)
Medication filled to pharmacy as requested.   

## 2016-01-27 NOTE — Telephone Encounter (Signed)
Last OV 12/15/15 Tramadol last filled 01/03/16 #45 with 0

## 2016-02-07 ENCOUNTER — Encounter: Payer: Self-pay | Admitting: Family Medicine

## 2016-02-07 DIAGNOSIS — M25619 Stiffness of unspecified shoulder, not elsewhere classified: Secondary | ICD-10-CM

## 2016-02-07 DIAGNOSIS — M25512 Pain in left shoulder: Secondary | ICD-10-CM

## 2016-02-08 NOTE — Telephone Encounter (Signed)
Referral placed.

## 2016-04-19 ENCOUNTER — Other Ambulatory Visit: Payer: Self-pay | Admitting: General Practice

## 2016-04-19 ENCOUNTER — Other Ambulatory Visit: Payer: Self-pay | Admitting: Family Medicine

## 2016-04-19 MED ORDER — TRAMADOL HCL 50 MG PO TABS: ORAL_TABLET | ORAL | Status: DC

## 2016-04-19 NOTE — Telephone Encounter (Signed)
Medication filled to pharmacy as requested.   

## 2016-04-19 NOTE — Telephone Encounter (Signed)
No further refills without an appt.  

## 2016-04-19 NOTE — Telephone Encounter (Signed)
Ok for #45, please remind him to schedule his f/u

## 2016-04-19 NOTE — Telephone Encounter (Signed)
Last OV 12/15/15 (DM, no upcoming appts) Tramadol last filled 01/27/16 #45 with 0

## 2016-05-12 ENCOUNTER — Other Ambulatory Visit: Payer: Self-pay | Admitting: Family Medicine

## 2016-05-12 NOTE — Telephone Encounter (Signed)
Last OV 12/15/15 (DM) Tramadol last filled and pt advised to make DM follow up.   Med denied today.

## 2016-05-16 ENCOUNTER — Other Ambulatory Visit: Payer: Self-pay | Admitting: Family Medicine

## 2016-05-16 ENCOUNTER — Encounter: Payer: Self-pay | Admitting: General Practice

## 2016-05-16 NOTE — Telephone Encounter (Signed)
Please advise, pt is overdue for a DM follow up. Meds were denied twice and keep being requested. Mychart message sent to pt to inform.

## 2016-05-16 NOTE — Telephone Encounter (Signed)
Med denied until pt makes a Diabetes follow up.

## 2016-06-11 ENCOUNTER — Other Ambulatory Visit: Payer: Self-pay | Admitting: Family Medicine

## 2016-07-09 ENCOUNTER — Other Ambulatory Visit: Payer: Self-pay | Admitting: Family Medicine

## 2016-08-06 ENCOUNTER — Other Ambulatory Visit: Payer: Self-pay | Admitting: Family Medicine

## 2016-08-10 ENCOUNTER — Other Ambulatory Visit: Payer: Self-pay | Admitting: Family Medicine

## 2016-08-17 ENCOUNTER — Other Ambulatory Visit: Payer: Self-pay | Admitting: Family Medicine

## 2016-09-11 ENCOUNTER — Other Ambulatory Visit: Payer: Self-pay | Admitting: Family Medicine

## 2016-09-13 ENCOUNTER — Other Ambulatory Visit: Payer: Self-pay | Admitting: General Practice

## 2016-09-13 MED ORDER — FENOFIBRATE 160 MG PO TABS: 160.0000 mg | ORAL_TABLET | Freq: Every day | ORAL | 0 refills | Status: DC

## 2016-09-19 ENCOUNTER — Ambulatory Visit (INDEPENDENT_AMBULATORY_CARE_PROVIDER_SITE_OTHER): Payer: BC Managed Care – PPO | Admitting: Family Medicine

## 2016-09-19 ENCOUNTER — Encounter: Payer: Self-pay | Admitting: Family Medicine

## 2016-09-19 VITALS — BP 112/72 | HR 62 | Temp 98.1°F | Resp 17 | Ht 74.0 in | Wt 255.4 lb

## 2016-09-19 DIAGNOSIS — E785 Hyperlipidemia, unspecified: Secondary | ICD-10-CM

## 2016-09-19 DIAGNOSIS — E119 Type 2 diabetes mellitus without complications: Secondary | ICD-10-CM | POA: Diagnosis not present

## 2016-09-19 DIAGNOSIS — I5022 Chronic systolic (congestive) heart failure: Secondary | ICD-10-CM

## 2016-09-19 LAB — LIPID PANEL
Cholesterol: 142 mg/dL (ref 0–200)
HDL: 34.4 mg/dL — ABNORMAL LOW (ref >39.00)
LDL Cholesterol: 71 mg/dL (ref 0–99)
NonHDL: 107.8
Total CHOL/HDL Ratio: 4
Triglycerides: 186 mg/dL — ABNORMAL HIGH (ref 0.0–149.0)
VLDL: 37.2 mg/dL (ref 0.0–40.0)

## 2016-09-19 LAB — HEPATIC FUNCTION PANEL
ALT: 25 U/L (ref 0–53)
AST: 14 U/L (ref 0–37)
Albumin: 4.5 g/dL (ref 3.5–5.2)
Alkaline Phosphatase: 34 U/L — ABNORMAL LOW (ref 39–117)
Bilirubin, Direct: 0.1 mg/dL (ref 0.0–0.3)
Total Bilirubin: 0.3 mg/dL (ref 0.2–1.2)
Total Protein: 6.8 g/dL (ref 6.0–8.3)

## 2016-09-19 LAB — CBC WITH DIFFERENTIAL/PLATELET
Basophils Absolute: 0 10*3/uL (ref 0.0–0.1)
Basophils Relative: 0.4 % (ref 0.0–3.0)
Eosinophils Absolute: 0.3 10*3/uL (ref 0.0–0.7)
Eosinophils Relative: 3.4 % (ref 0.0–5.0)
HCT: 40.1 % (ref 39.0–52.0)
Hemoglobin: 13.6 g/dL (ref 13.0–17.0)
Lymphocytes Relative: 37.6 % (ref 12.0–46.0)
Lymphs Abs: 3.5 10*3/uL (ref 0.7–4.0)
MCHC: 33.9 g/dL (ref 30.0–36.0)
MCV: 91.3 fl (ref 78.0–100.0)
Monocytes Absolute: 0.6 10*3/uL (ref 0.1–1.0)
Monocytes Relative: 6.5 % (ref 3.0–12.0)
Neutro Abs: 4.8 10*3/uL (ref 1.4–7.7)
Neutrophils Relative %: 52.1 % (ref 43.0–77.0)
Platelets: 225 10*3/uL (ref 150.0–400.0)
RBC: 4.39 Mil/uL (ref 4.22–5.81)
RDW: 12.9 % (ref 11.5–15.5)
WBC: 9.2 10*3/uL (ref 4.0–10.5)

## 2016-09-19 LAB — BASIC METABOLIC PANEL
BUN: 17 mg/dL (ref 6–23)
CO2: 26 mEq/L (ref 19–32)
Calcium: 9.8 mg/dL (ref 8.4–10.5)
Chloride: 107 mEq/L (ref 96–112)
Creatinine, Ser: 1.15 mg/dL (ref 0.40–1.50)
GFR: 71 mL/min (ref >60.00)
Glucose, Bld: 127 mg/dL — ABNORMAL HIGH (ref 70–99)
Potassium: 4.7 mEq/L (ref 3.5–5.1)
Sodium: 139 mEq/L (ref 135–145)

## 2016-09-19 LAB — HEMOGLOBIN A1C: Hgb A1c MFr Bld: 6.9 % — ABNORMAL HIGH (ref 4.6–6.5)

## 2016-09-19 LAB — TSH: TSH: 2.98 u[IU]/mL (ref 0.35–4.50)

## 2016-09-19 NOTE — Progress Notes (Signed)
Pre visit review using our clinic review tool, if applicable. No additional management support is needed unless otherwise documented below in the visit note. 

## 2016-09-19 NOTE — Assessment & Plan Note (Signed)
Chronic problem.  On Metformin daily.  On ARB for renal protection.  Pt plans to schedule eye exam.  Foot exam done today.  Stressed need for healthy diet and regular exercise.  Check labs.  Adjust meds prn

## 2016-09-19 NOTE — Assessment & Plan Note (Signed)
Chronic problem.  Asymptomatic.  On beta blocker and ARB.  Will continue to follow.

## 2016-09-19 NOTE — Patient Instructions (Signed)
Schedule your complete physical in 3-4 months We'll notify you of your lab results and make any changes if needed Try and work on healthy diet and regular exercise- you can do it!!! Call and schedule your eye exam at your convenience Call with any questions or concerns Happy Holidays!!!

## 2016-09-19 NOTE — Progress Notes (Signed)
   Subjective:    Patient ID: Jason Floyd, male    DOB: 1966/09/10, 50 y.o.   MRN: [id number]  HPI DM- chronic problem, on Metformin daily.  Overdue for foot exam, eye exam- pt plans to schedule in January.  On ARB for renal protection.  Pt has gained 10 lbs since recent visit.  Denies symptomatic lows.  No numbness/tingling of hands/feet.  No sores on feet.  Limited exercise.  Hyperlipidemia- chronic problem, on Lipitor and Fenofibrate.  Denies abd pain, N/V, myalgias  CHF- chronic problem, currently asymptomatic on Coreg, Diovan.  Denies CP, SOB, edema   Review of Systems For ROS see HPI     Objective:   Physical Exam  Constitutional: He is oriented to person, place, and time. He appears well-developed and well-nourished. No distress.  HENT:  Head: Normocephalic and atraumatic.  Eyes: Conjunctivae and EOM are normal. Pupils are equal, round, and reactive to light.  Neck: Normal range of motion. Neck supple. No thyromegaly present.  Cardiovascular: Normal rate, regular rhythm, normal heart sounds and intact distal pulses.   No murmur heard. Pulmonary/Chest: Effort normal and breath sounds normal. No respiratory distress.  Abdominal: Soft. Bowel sounds are normal. He exhibits no distension.  Musculoskeletal: He exhibits no edema.  Lymphadenopathy:    He has no cervical adenopathy.  Neurological: He is alert and oriented to person, place, and time. No cranial nerve deficit.  Skin: Skin is warm and dry.  Psychiatric: He has a normal mood and affect. His behavior is normal.  Vitals reviewed.         Assessment & Plan:

## 2016-09-19 NOTE — Assessment & Plan Note (Signed)
Chronic problem.  Tolerating statin w/o difficulty.  Check labs.  Adjust meds prn  

## 2016-10-11 ENCOUNTER — Other Ambulatory Visit: Payer: Self-pay | Admitting: Family Medicine

## 2016-10-11 NOTE — Telephone Encounter (Signed)
Medication filled to pharmacy as requested.   

## 2016-10-11 NOTE — Telephone Encounter (Signed)
Last OV 09/19/16 Tramadol last filled 04/19/16 #45 with 0  Low risk

## 2016-11-08 ENCOUNTER — Other Ambulatory Visit: Payer: Self-pay | Admitting: Family Medicine

## 2016-11-08 NOTE — Telephone Encounter (Signed)
Last OV 09/19/16 Tramadol last filled 10/11/16 #30 with 0

## 2016-12-31 ENCOUNTER — Other Ambulatory Visit: Payer: Self-pay | Admitting: Family Medicine

## 2016-12-31 NOTE — Telephone Encounter (Signed)
Medication filled to pharmacy as requested.   

## 2016-12-31 NOTE — Telephone Encounter (Signed)
Indication for chronic opioid: chronic back and neck pain Medication and dose: Tramadol 50mg   # pills per month: 45 Last UDS date:   Pain contract signed (Y/N): Y Date narcotic database last reviewed (include red flags): Reviewed 12/31/16- no red flags

## 2016-12-31 NOTE — Telephone Encounter (Signed)
Last OV 09/19/16 Tramadol last filled 11/08/16 #45 with 0

## 2017-01-30 ENCOUNTER — Other Ambulatory Visit: Payer: Self-pay | Admitting: General Practice

## 2017-01-30 MED ORDER — TRAMADOL HCL 50 MG PO TABS: 50.0000 mg | ORAL_TABLET | Freq: Three times a day (TID) | ORAL | 0 refills | Status: DC | PRN

## 2017-01-30 NOTE — Telephone Encounter (Signed)
Last OV 09/19/16 Tramadol last filled 12/31/16 #45 with 0

## 2017-01-30 NOTE — Telephone Encounter (Signed)
Medication filled to pharmacy as requested.   

## 2017-02-26 ENCOUNTER — Other Ambulatory Visit: Payer: Self-pay | Admitting: Family Medicine

## 2017-02-26 NOTE — Telephone Encounter (Signed)
Last OV 09/19/16 Tramadol last filled 01/30/17 #45 with 0

## 2017-02-26 NOTE — Telephone Encounter (Signed)
Medication filled to pharmacy as requested.   

## 2017-03-18 LAB — HM DIABETES EYE EXAM

## 2017-05-17 ENCOUNTER — Other Ambulatory Visit: Payer: Self-pay | Admitting: Family Medicine

## 2017-05-17 MED ORDER — FENOFIBRATE 160 MG PO TABS: 160.0000 mg | ORAL_TABLET | Freq: Every day | ORAL | 1 refills | Status: DC

## 2017-05-17 MED ORDER — METFORMIN HCL ER 500 MG PO TB24: 500.0000 mg | ORAL_TABLET | Freq: Every day | ORAL | 0 refills | Status: DC

## 2017-05-20 ENCOUNTER — Telehealth: Payer: Self-pay | Admitting: Family Medicine

## 2017-05-20 NOTE — Telephone Encounter (Signed)
Medication was filled for pt last week.

## 2017-05-20 NOTE — Telephone Encounter (Signed)
Pt received notation from pharmacy that an appt was needed, pt has scheduled a follow up for next week with KT and is asking if all Rx would be called in for him now. Please advise

## 2017-05-27 ENCOUNTER — Other Ambulatory Visit: Payer: Self-pay | Admitting: Family Medicine

## 2017-05-27 ENCOUNTER — Telehealth: Payer: Self-pay | Admitting: Emergency Medicine

## 2017-05-27 MED ORDER — LOSARTAN POTASSIUM 100 MG PO TABS: 100.0000 mg | ORAL_TABLET | Freq: Every day | ORAL | 3 refills | Status: DC

## 2017-05-27 NOTE — Telephone Encounter (Signed)
Ok to switch to Losartan 100mg  daily and repeat BP at OV in 1 month

## 2017-05-27 NOTE — Telephone Encounter (Signed)
Patient received a recall notification from his pharmacy advising that Valsartan has been recalled due to a carcinogen found in the medication. Patient wants to know if medication could be switched or other recommendations. Please advise

## 2017-05-27 NOTE — Telephone Encounter (Signed)
Pt informed and med filled.

## 2017-05-30 ENCOUNTER — Encounter: Payer: Self-pay | Admitting: Family Medicine

## 2017-05-30 ENCOUNTER — Ambulatory Visit (INDEPENDENT_AMBULATORY_CARE_PROVIDER_SITE_OTHER): Payer: BC Managed Care – PPO | Admitting: Family Medicine

## 2017-05-30 VITALS — BP 126/82 | HR 79 | Temp 98.3°F | Resp 16 | Ht 74.0 in | Wt 263.4 lb

## 2017-05-30 DIAGNOSIS — E785 Hyperlipidemia, unspecified: Secondary | ICD-10-CM | POA: Diagnosis not present

## 2017-05-30 DIAGNOSIS — E119 Type 2 diabetes mellitus without complications: Secondary | ICD-10-CM | POA: Diagnosis not present

## 2017-05-30 DIAGNOSIS — I5022 Chronic systolic (congestive) heart failure: Secondary | ICD-10-CM

## 2017-05-30 LAB — CBC WITH DIFFERENTIAL/PLATELET
Basophils Absolute: 0.1 K/uL (ref 0.0–0.1)
Basophils Relative: 0.6 % (ref 0.0–3.0)
Eosinophils Absolute: 0.4 K/uL (ref 0.0–0.7)
Eosinophils Relative: 3.3 % (ref 0.0–5.0)
HCT: 40.8 % (ref 39.0–52.0)
Hemoglobin: 13.8 g/dL (ref 13.0–17.0)
Lymphocytes Relative: 25.9 % (ref 12.0–46.0)
Lymphs Abs: 3.1 K/uL (ref 0.7–4.0)
MCHC: 33.9 g/dL (ref 30.0–36.0)
MCV: 92.3 fl (ref 78.0–100.0)
Monocytes Absolute: 0.7 K/uL (ref 0.1–1.0)
Monocytes Relative: 5.6 % (ref 3.0–12.0)
Neutro Abs: 7.7 K/uL (ref 1.4–7.7)
Neutrophils Relative %: 64.6 % (ref 43.0–77.0)
Platelets: 271 K/uL (ref 150.0–400.0)
RBC: 4.42 Mil/uL (ref 4.22–5.81)
RDW: 12.8 % (ref 11.5–15.5)
WBC: 11.9 K/uL — ABNORMAL HIGH (ref 4.0–10.5)

## 2017-05-30 LAB — LIPID PANEL
Cholesterol: 169 mg/dL (ref 0–200)
HDL: 33.7 mg/dL — ABNORMAL LOW (ref >39.00)
NonHDL: 135.44
Total CHOL/HDL Ratio: 5
Triglycerides: 364 mg/dL — ABNORMAL HIGH (ref 0.0–149.0)
VLDL: 72.8 mg/dL — ABNORMAL HIGH (ref 0.0–40.0)

## 2017-05-30 LAB — LDL CHOLESTEROL, DIRECT: Direct LDL: 95 mg/dL

## 2017-05-30 LAB — BASIC METABOLIC PANEL
BUN: 20 mg/dL (ref 6–23)
CO2: 29 mEq/L (ref 19–32)
Calcium: 10.1 mg/dL (ref 8.4–10.5)
Chloride: 104 mEq/L (ref 96–112)
Creatinine, Ser: 1.15 mg/dL (ref 0.40–1.50)
GFR: 70.81 mL/min (ref >60.00)
Glucose, Bld: 181 mg/dL — ABNORMAL HIGH (ref 70–99)
Potassium: 4.6 mEq/L (ref 3.5–5.1)
Sodium: 138 mEq/L (ref 135–145)

## 2017-05-30 LAB — TSH: TSH: 1.72 u[IU]/mL (ref 0.35–4.50)

## 2017-05-30 LAB — HEPATIC FUNCTION PANEL
ALT: 27 U/L (ref 0–53)
AST: 15 U/L (ref 0–37)
Albumin: 4.3 g/dL (ref 3.5–5.2)
Alkaline Phosphatase: 44 U/L (ref 39–117)
Bilirubin, Direct: 0.1 mg/dL (ref 0.0–0.3)
Total Bilirubin: 0.3 mg/dL (ref 0.2–1.2)
Total Protein: 6.5 g/dL (ref 6.0–8.3)

## 2017-05-30 LAB — HEMOGLOBIN A1C: Hgb A1c MFr Bld: 8.4 % — ABNORMAL HIGH (ref 4.6–6.5)

## 2017-05-30 MED ORDER — CARVEDILOL 12.5 MG PO TABS: ORAL_TABLET | ORAL | 1 refills | Status: DC

## 2017-05-30 NOTE — Patient Instructions (Signed)
Schedule your complete physical in November We'll notify you of your lab results and make any changes if needed Continue to work on healthy diet and regular exercise- you can do it! Call with any questions or concerns Have a wonderful time in [place]!!!

## 2017-05-30 NOTE — Assessment & Plan Note (Signed)
Chronic problem, asymptomatic.  On beta blocker and ARB.  Will continue to follow.

## 2017-05-30 NOTE — Progress Notes (Signed)
   Subjective:    Patient ID: Jason Floyd, male    DOB: [birth year], 51 y.o.   MRN: [id number]  HPI DM- chronic problem, on Metformin.  On ARB for renal protection.  UTD on foot exam, eye exam.  Denies HAs, visual changes, numbness/tingling of hands/feet.  Rare symptomatic lows and these only occur if skipping meals.    CHF- chronic problem, on Coreg, Losartan (recently switch from Valsartan due to recall) w/ good control.  Denies CP, SOB, edema.  Hyperlipidemia- chronic problem, on Fenofibrate and Lipitor daily but not taking the Fenofibrate.  Pt has gained 8 lbs since last visit.  Reports regular exercise but has been stress eating.  No abd pain, N/V.   Review of Systems For ROS see HPI     Objective:   Physical Exam  Constitutional: He is oriented to person, place, and time. He appears well-developed and well-nourished. No distress.  HENT:  Head: Normocephalic and atraumatic.  Eyes: Pupils are equal, round, and reactive to light. Conjunctivae and EOM are normal.  Neck: Normal range of motion. Neck supple. No thyromegaly present.  Cardiovascular: Normal rate, regular rhythm, normal heart sounds and intact distal pulses.   No murmur heard. Pulmonary/Chest: Effort normal and breath sounds normal. No respiratory distress.  Abdominal: Soft. Bowel sounds are normal. He exhibits no distension.  Musculoskeletal: He exhibits no edema.  Lymphadenopathy:    He has no cervical adenopathy.  Neurological: He is alert and oriented to person, place, and time. No cranial nerve deficit.  Skin: Skin is warm and dry.  Psychiatric: He has a normal mood and affect. His behavior is normal.  Vitals reviewed.         Assessment & Plan:

## 2017-05-30 NOTE — Progress Notes (Signed)
Pre visit review using our clinic review tool, if applicable. No additional management support is needed unless otherwise documented below in the visit note. 

## 2017-05-30 NOTE — Assessment & Plan Note (Signed)
Chronic problem.  Tolerating statin w/o difficulty.  Pt stopped his Fenofibrate w/o reason.  Stressed need for healthy diet and regular exercise.  Check labs.  Adjust meds prn

## 2017-05-30 NOTE — Assessment & Plan Note (Signed)
Chronic problem.  On Metformin w/ hx of good control.  UTD on foot exam, eye exam.  On ARB for renal protection.  Stressed need for healthy diet and regular exercise.  Check labs.  Adjust meds prn

## 2017-05-31 ENCOUNTER — Other Ambulatory Visit: Payer: Self-pay | Admitting: General Practice

## 2017-05-31 MED ORDER — FENOFIBRATE 160 MG PO TABS: 160.0000 mg | ORAL_TABLET | Freq: Every day | ORAL | 1 refills | Status: DC

## 2017-05-31 MED ORDER — METFORMIN HCL ER 500 MG PO TB24: 1000.0000 mg | ORAL_TABLET | Freq: Every day | ORAL | 1 refills | Status: DC

## 2017-06-14 ENCOUNTER — Other Ambulatory Visit: Payer: Self-pay | Admitting: Family Medicine

## 2017-07-19 ENCOUNTER — Other Ambulatory Visit: Payer: Self-pay | Admitting: Family Medicine

## 2017-07-19 NOTE — Telephone Encounter (Signed)
Medication filled to pharmacy as requested.   

## 2017-09-10 ENCOUNTER — Other Ambulatory Visit: Payer: Self-pay | Admitting: Family Medicine

## 2017-09-17 ENCOUNTER — Encounter: Payer: Self-pay | Admitting: Family Medicine

## 2017-09-18 ENCOUNTER — Encounter: Payer: Self-pay | Admitting: Family Medicine

## 2017-09-18 ENCOUNTER — Other Ambulatory Visit: Payer: Self-pay

## 2017-09-18 ENCOUNTER — Ambulatory Visit (INDEPENDENT_AMBULATORY_CARE_PROVIDER_SITE_OTHER): Payer: BC Managed Care – PPO | Admitting: Family Medicine

## 2017-09-18 VITALS — BP 121/81 | HR 71 | Temp 98.2°F | Resp 16 | Ht 74.0 in | Wt 264.5 lb

## 2017-09-18 DIAGNOSIS — Z125 Encounter for screening for malignant neoplasm of prostate: Secondary | ICD-10-CM | POA: Diagnosis not present

## 2017-09-18 DIAGNOSIS — Z0000 Encounter for general adult medical examination without abnormal findings: Secondary | ICD-10-CM

## 2017-09-18 DIAGNOSIS — Z0001 Encounter for general adult medical examination with abnormal findings: Secondary | ICD-10-CM | POA: Diagnosis not present

## 2017-09-18 DIAGNOSIS — R7989 Other specified abnormal findings of blood chemistry: Secondary | ICD-10-CM | POA: Diagnosis not present

## 2017-09-18 DIAGNOSIS — M79645 Pain in left finger(s): Secondary | ICD-10-CM

## 2017-09-18 DIAGNOSIS — E119 Type 2 diabetes mellitus without complications: Secondary | ICD-10-CM | POA: Diagnosis not present

## 2017-09-18 LAB — CBC WITH DIFFERENTIAL/PLATELET
Basophils Absolute: 0.1 10*3/uL (ref 0.0–0.1)
Basophils Relative: 0.6 % (ref 0.0–3.0)
Eosinophils Absolute: 0.6 10*3/uL (ref 0.0–0.7)
Eosinophils Relative: 6.2 % — ABNORMAL HIGH (ref 0.0–5.0)
HCT: 40 % (ref 39.0–52.0)
Hemoglobin: 13.7 g/dL (ref 13.0–17.0)
Lymphocytes Relative: 36.3 % (ref 12.0–46.0)
Lymphs Abs: 3.4 10*3/uL (ref 0.7–4.0)
MCHC: 34.2 g/dL (ref 30.0–36.0)
MCV: 93.5 fl (ref 78.0–100.0)
Monocytes Absolute: 0.7 10*3/uL (ref 0.1–1.0)
Monocytes Relative: 7.6 % (ref 3.0–12.0)
Neutro Abs: 4.7 10*3/uL (ref 1.4–7.7)
Neutrophils Relative %: 49.3 % (ref 43.0–77.0)
Platelets: 257 10*3/uL (ref 150.0–400.0)
RBC: 4.27 Mil/uL (ref 4.22–5.81)
RDW: 12.8 % (ref 11.5–15.5)
WBC: 9.5 10*3/uL (ref 4.0–10.5)

## 2017-09-18 LAB — HEPATIC FUNCTION PANEL
ALT: 36 U/L (ref 0–53)
AST: 21 U/L (ref 0–37)
Albumin: 4.2 g/dL (ref 3.5–5.2)
Alkaline Phosphatase: 40 U/L (ref 39–117)
Bilirubin, Direct: 0 mg/dL (ref 0.0–0.3)
Total Bilirubin: 0.3 mg/dL (ref 0.2–1.2)
Total Protein: 6.8 g/dL (ref 6.0–8.3)

## 2017-09-18 LAB — BASIC METABOLIC PANEL
BUN: 14 mg/dL (ref 6–23)
CO2: 29 mEq/L (ref 19–32)
Calcium: 9.9 mg/dL (ref 8.4–10.5)
Chloride: 104 mEq/L (ref 96–112)
Creatinine, Ser: 1.18 mg/dL (ref 0.40–1.50)
GFR: 68.66 mL/min (ref >60.00)
Glucose, Bld: 249 mg/dL — ABNORMAL HIGH (ref 70–99)
Potassium: 4 mEq/L (ref 3.5–5.1)
Sodium: 139 mEq/L (ref 135–145)

## 2017-09-18 LAB — LIPID PANEL
Cholesterol: 176 mg/dL (ref 0–200)
HDL: 25.6 mg/dL — ABNORMAL LOW (ref >39.00)
Total CHOL/HDL Ratio: 7
Triglycerides: 465 mg/dL — ABNORMAL HIGH (ref 0.0–149.0)

## 2017-09-18 LAB — LDL CHOLESTEROL, DIRECT: Direct LDL: 98 mg/dL

## 2017-09-18 LAB — PSA: PSA: 0.5 ng/mL (ref 0.10–4.00)

## 2017-09-18 LAB — TSH: TSH: 3.29 u[IU]/mL (ref 0.35–4.50)

## 2017-09-18 LAB — HEMOGLOBIN A1C: Hgb A1c MFr Bld: 8.1 % — ABNORMAL HIGH (ref 4.6–6.5)

## 2017-09-18 NOTE — Patient Instructions (Addendum)
Follow up in 3-4 months to recheck diabetes We'll notify you of your lab results and make any changes if needed Continue to work on healthy diet and regular exercise- you can do it!! We'll call you with your Ortho appt for the thumb pain Call with any questions or concerns Happy Fall!!!

## 2017-09-18 NOTE — Progress Notes (Signed)
   Subjective:    Patient ID: Jason Floyd, male    DOB: 08/31/1966, 51 y.o.   MRN: [id number]  HPI CPE- UTD on colonoscopy, eye exam (Dr Tedd Sias).  Due for foot exam.  UTD on Tdap.  On ARB for renal protection.   Review of Systems Patient reports no vision/hearing changes, anorexia, fever ,adenopathy, persistant/recurrent hoarseness, swallowing issues, chest pain, palpitations, edema, persistant/recurrent cough, hemoptysis, dyspnea (rest,exertional, paroxysmal nocturnal), gastrointestinal  bleeding (melena, rectal bleeding), abdominal pain, excessive heart burn, GU symptoms (dysuria, hematuria, voiding/incontinence issues) syncope, focal weakness, memory loss, numbness & tingling, skin/hair/nail changes, depression, anxiety, abnormal bruising/bleeding, musculoskeletal symptoms/signs.   L thumb pain- occurring more regularly, tends to be related to activity.  Goes to [employer] Melvyn Novas)    Objective:   Physical Exam BP 121/81   Pulse 71   Temp 98.2 F (36.8 C) (Oral)   Resp 16   Ht 6\' 2"  (1.88 m)   Wt 264 lb 8 oz (120 kg)   SpO2 97%   BMI 33.96 kg/m   General Appearance:    Alert, cooperative, no distress, appears stated age, obese  Head:    Normocephalic, without obvious abnormality, atraumatic  Eyes:    PERRL, conjunctiva/corneas clear, EOM's intact, fundi    benign, both eyes       Ears:    Normal TM's and external ear canals, both ears  Nose:   Nares normal, septum midline, mucosa normal, no drainage   or sinus tenderness  Throat:   Lips, mucosa, and tongue normal; teeth and gums normal  Neck:   Supple, symmetrical, trachea midline, no adenopathy;       thyroid:  No enlargement/tenderness/nodules  Back:     Symmetric, no curvature, ROM normal, no CVA tenderness  Lungs:     Clear to auscultation bilaterally, respirations unlabored  Chest wall:    No tenderness or deformity  Heart:    Regular rate and rhythm, S1 and S2 normal, no murmur, rub   or gallop  Abdomen:      Soft, non-tender, bowel sounds active all four quadrants,    no masses, no organomegaly  Genitalia:    Normal male without lesion, discharge or tenderness  Rectal:    Normal tone, normal prostate, no masses or tenderness  Extremities:   Extremities normal, atraumatic, no cyanosis or edema  Pulses:   2+ and symmetric all extremities  Skin:   Skin color, texture, turgor normal, no rashes or lesions  Lymph nodes:   Cervical, supraclavicular, and axillary nodes normal  Neurologic:   CNII-XII intact. Normal strength, sensation and reflexes      throughout          Assessment & Plan:

## 2017-09-18 NOTE — Assessment & Plan Note (Signed)
Chronic problem.  Tolerating Metformin w/o difficulty.  UTD on eye exam.  Foot exam done today.  On ARB for renal protection.  Stressed need for healthy diet and regular exercise.  Check labs.  Adjust meds prn

## 2017-09-18 NOTE — Assessment & Plan Note (Signed)
Pt's PE WNL.  UTD on colonoscopy, Tdap.  Declines flu despite discussion as to benefits.  Stressed need for healthy diet and regular exercise.  Check labs.  Anticipatory guidance provided.

## 2017-09-19 ENCOUNTER — Encounter: Payer: Self-pay | Admitting: Family Medicine

## 2017-09-19 ENCOUNTER — Other Ambulatory Visit: Payer: Self-pay | Admitting: General Practice

## 2017-09-19 MED ORDER — DAPAGLIFLOZIN PROPANEDIOL 5 MG PO TABS: 5.0000 mg | ORAL_TABLET | Freq: Every day | ORAL | 6 refills | Status: DC

## 2017-09-25 ENCOUNTER — Other Ambulatory Visit: Payer: Self-pay | Admitting: Family Medicine

## 2017-10-23 ENCOUNTER — Other Ambulatory Visit: Payer: Self-pay | Admitting: Family Medicine

## 2017-12-13 ENCOUNTER — Other Ambulatory Visit: Payer: Self-pay | Admitting: Family Medicine

## 2018-01-08 ENCOUNTER — Other Ambulatory Visit: Payer: Self-pay | Admitting: Family Medicine

## 2018-01-08 ENCOUNTER — Encounter: Payer: Self-pay | Admitting: Family Medicine

## 2018-01-08 MED ORDER — ZOLPIDEM TARTRATE 5 MG PO TABS: 5.0000 mg | ORAL_TABLET | Freq: Every evening | ORAL | 3 refills | Status: DC | PRN

## 2018-01-08 NOTE — Telephone Encounter (Signed)
Last OV 09/18/17 Zolpidem last filled 08/31/15 #30 with 3

## 2018-01-16 ENCOUNTER — Encounter: Payer: Self-pay | Admitting: Family Medicine

## 2018-01-17 ENCOUNTER — Other Ambulatory Visit: Payer: Self-pay | Admitting: Family Medicine

## 2018-01-22 ENCOUNTER — Other Ambulatory Visit: Payer: Self-pay

## 2018-01-22 ENCOUNTER — Ambulatory Visit: Payer: BC Managed Care – PPO | Admitting: Family Medicine

## 2018-01-22 ENCOUNTER — Encounter: Payer: Self-pay | Admitting: Family Medicine

## 2018-01-22 VITALS — BP 124/78 | HR 82 | Temp 98.1°F | Resp 15 | Ht 74.0 in | Wt 256.8 lb

## 2018-01-22 DIAGNOSIS — G4700 Insomnia, unspecified: Secondary | ICD-10-CM | POA: Diagnosis not present

## 2018-01-22 DIAGNOSIS — E119 Type 2 diabetes mellitus without complications: Secondary | ICD-10-CM | POA: Diagnosis not present

## 2018-01-22 LAB — BASIC METABOLIC PANEL
BUN: 17 mg/dL (ref 6–23)
CO2: 28 mEq/L (ref 19–32)
Calcium: 10.1 mg/dL (ref 8.4–10.5)
Chloride: 99 mEq/L (ref 96–112)
Creatinine, Ser: 1.12 mg/dL (ref 0.40–1.50)
GFR: 72.83 mL/min (ref >60.00)
Glucose, Bld: 192 mg/dL — ABNORMAL HIGH (ref 70–99)
Potassium: 4.3 mEq/L (ref 3.5–5.1)
Sodium: 136 mEq/L (ref 135–145)

## 2018-01-22 LAB — HEMOGLOBIN A1C: Hgb A1c MFr Bld: 7.8 % — ABNORMAL HIGH (ref 4.6–6.5)

## 2018-01-22 MED ORDER — TRAZODONE HCL 100 MG PO TABS: 100.0000 mg | ORAL_TABLET | Freq: Every day | ORAL | 6 refills | Status: DC

## 2018-01-22 NOTE — Progress Notes (Signed)
   Subjective:    Patient ID: Jason Floyd, male    DOB: 1966-05-28, 52 y.o.   MRN: [id number]  HPI DM- chronic problem.  On Metformin XR 1000mg  daily and Farxiga 5mg  daily.  On ARB for renal protection.  UTD on eye exam and foot exam.  Pt is down 9 lbs since last visit.  Pt reports he is working on portion sizes and cutting back on sugar.  No CP, SOB, HAs, visual changes, edema.  Rare symptomatic lows.  No numbness/tingling of hands/feet.  Insomnia- pt had taken this medication for years but then 'stopped for a year'.  Restarted this late last month.  'sometimes it works great and other times I just lie awake feeling weird'.  Pt has tried Xanax and Valium that leaves him w/ a medication hangover.  Has also tried 100mg  Trazodone which works very well for him.  Review of Systems For ROS see HPI     Objective:   Physical Exam  Constitutional: He is oriented to person, place, and time. He appears well-developed and well-nourished. No distress.  HENT:  Head: Normocephalic and atraumatic.  Eyes: Conjunctivae and EOM are normal. Pupils are equal, round, and reactive to light.  Neck: Normal range of motion. Neck supple. No thyromegaly present.  Cardiovascular: Normal rate, regular rhythm, normal heart sounds and intact distal pulses.  No murmur heard. Pulmonary/Chest: Effort normal and breath sounds normal. No respiratory distress.  Abdominal: Soft. Bowel sounds are normal. He exhibits no distension.  Musculoskeletal: He exhibits no edema.  Lymphadenopathy:    He has no cervical adenopathy.  Neurological: He is alert and oriented to person, place, and time. No cranial nerve deficit.  Skin: Skin is warm and dry.  Psychiatric: He has a normal mood and affect. His behavior is normal.  Vitals reviewed.         Assessment & Plan:

## 2018-01-22 NOTE — Patient Instructions (Signed)
Follow up in 3-4 months to recheck diabetes and cholesterol We'll notify you of your lab results and make any changes if needed STOP the Ambien START the Trazodone nightly Continue to work on healthy diet and regular exercise- you're doing great! Call with any questions or concerns Happy Spring!!!

## 2018-01-22 NOTE — Assessment & Plan Note (Signed)
Chronic problem.  UTD on foot exam and eye exam.  On ARB for renal protection.  Applauded his efforts at weight loss.  Check labs.  Adjust meds prn.

## 2018-01-22 NOTE — Assessment & Plan Note (Signed)
Chronic problem.  No reliable improvement on Ambien.  After experimenting w/ friend's medications, he feels Trazodone is more effective.  Prescription sent.

## 2018-01-23 ENCOUNTER — Encounter: Payer: Self-pay | Admitting: General Practice

## 2018-02-03 ENCOUNTER — Other Ambulatory Visit: Payer: Self-pay | Admitting: Family Medicine

## 2018-03-04 ENCOUNTER — Other Ambulatory Visit: Payer: Self-pay | Admitting: Family Medicine

## 2018-03-05 NOTE — Telephone Encounter (Signed)
Received and reviewed medication refill request.  Request is appropriate and was approved.  Please see medication orders for details. Chronic med per chart review.

## 2018-03-05 NOTE — Telephone Encounter (Signed)
Last OV 01/16/18, Next OV 04/16/18  Last filled 01/17/18, #45 tablets with 1 refill  Still okay to fill? I do not see in past history?

## 2018-04-01 ENCOUNTER — Other Ambulatory Visit: Payer: Self-pay | Admitting: Family Medicine

## 2018-04-01 NOTE — Telephone Encounter (Signed)
Last OV 01/22/18, Next OV 04/16/18  Last filled 03/05/18, # 45 with 1 refill

## 2018-04-03 LAB — HM DIABETES EYE EXAM

## 2018-04-04 ENCOUNTER — Encounter: Payer: Self-pay | Admitting: General Practice

## 2018-04-16 ENCOUNTER — Ambulatory Visit: Payer: BC Managed Care – PPO | Admitting: Family Medicine

## 2018-04-16 ENCOUNTER — Other Ambulatory Visit: Payer: Self-pay

## 2018-04-16 ENCOUNTER — Encounter: Payer: Self-pay | Admitting: Family Medicine

## 2018-04-16 VITALS — BP 122/76 | HR 78 | Temp 98.0°F | Resp 16 | Ht 74.0 in | Wt 254.0 lb

## 2018-04-16 DIAGNOSIS — E119 Type 2 diabetes mellitus without complications: Secondary | ICD-10-CM | POA: Diagnosis not present

## 2018-04-16 DIAGNOSIS — E785 Hyperlipidemia, unspecified: Secondary | ICD-10-CM | POA: Diagnosis not present

## 2018-04-16 DIAGNOSIS — I10 Essential (primary) hypertension: Secondary | ICD-10-CM | POA: Diagnosis not present

## 2018-04-16 NOTE — Patient Instructions (Addendum)
Schedule your complete physical in November We'll notify you of your lab results and make any changes if needed Continue to work on healthy diet and regular exercise Call with any questions or concerns Have a great summer!!

## 2018-04-16 NOTE — Assessment & Plan Note (Signed)
Chronic problem.  Well controlled.  Asymptomatic.  Check labs.  No anticipated med changes.  Will follow. 

## 2018-04-16 NOTE — Assessment & Plan Note (Signed)
Chronic problem.  Tolerating statin and fenofibrate w/o difficulty.  Encouraged healthy diet and regula exercise.  Check labs.  Adjust meds prn

## 2018-04-16 NOTE — Progress Notes (Signed)
   Subjective:    Patient ID: Jason Floyd, male    DOB: 1966/09/14, 52 y.o.   MRN: [id number]  HPI DM- chronic problem, on Farxiga.  On ARB for renal protection.  UTD on eye exam, foot exam.  Denies symptomatic lows.  No numbness/tingling of hands/feet.  No regular exercise but doing 'some walking'.  Down 3 lbs.  HTN- chronic problem, on Coreg 12.5mg  BID, Losartan 100mg  daily w/ good control.  No CP, SOB, HAs, visual changes, edema.  Hyperlipidemia- chronic problem, on Lipitor 20mg  and Fenofibrate 160mg  daily.  No abd pain, N/V.   Review of Systems For ROS see HPI     Objective:   Physical Exam  Constitutional: He is oriented to person, place, and time. He appears well-developed and well-nourished. No distress.  HENT:  Head: Normocephalic and atraumatic.  Eyes: Pupils are equal, round, and reactive to light. Conjunctivae and EOM are normal.  Neck: Normal range of motion. Neck supple. No thyromegaly present.  Cardiovascular: Normal rate, regular rhythm, normal heart sounds and intact distal pulses.  No murmur heard. Pulmonary/Chest: Effort normal and breath sounds normal. No respiratory distress.  Abdominal: Soft. Bowel sounds are normal. He exhibits no distension.  Musculoskeletal: He exhibits no edema.  Lymphadenopathy:    He has no cervical adenopathy.  Neurological: He is alert and oriented to person, place, and time. No cranial nerve deficit.  Skin: Skin is warm and dry.  Psychiatric: He has a normal mood and affect. His behavior is normal.  Vitals reviewed.         Assessment & Plan:

## 2018-04-16 NOTE — Assessment & Plan Note (Signed)
Chronic problem.  Tolerating Farxiga w/o difficulty.  UTD on eye exam, foot exam.  On ARB for renal protection.  Check labs.  Adjust meds prn

## 2018-04-23 ENCOUNTER — Encounter: Payer: Self-pay | Admitting: *Deleted

## 2018-04-23 ENCOUNTER — Other Ambulatory Visit: Payer: Self-pay | Admitting: *Deleted

## 2018-04-23 DIAGNOSIS — E119 Type 2 diabetes mellitus without complications: Secondary | ICD-10-CM

## 2018-04-23 DIAGNOSIS — Z0000 Encounter for general adult medical examination without abnormal findings: Secondary | ICD-10-CM

## 2018-04-23 DIAGNOSIS — E785 Hyperlipidemia, unspecified: Secondary | ICD-10-CM

## 2018-05-06 ENCOUNTER — Other Ambulatory Visit (INDEPENDENT_AMBULATORY_CARE_PROVIDER_SITE_OTHER): Payer: BC Managed Care – PPO

## 2018-05-06 DIAGNOSIS — Z0000 Encounter for general adult medical examination without abnormal findings: Secondary | ICD-10-CM | POA: Diagnosis not present

## 2018-05-06 DIAGNOSIS — E785 Hyperlipidemia, unspecified: Secondary | ICD-10-CM | POA: Diagnosis not present

## 2018-05-06 DIAGNOSIS — E119 Type 2 diabetes mellitus without complications: Secondary | ICD-10-CM

## 2018-05-06 LAB — LIPID PANEL
Cholesterol: 162 mg/dL (ref 0–200)
HDL: 32.8 mg/dL — ABNORMAL LOW (ref >39.00)
NonHDL: 128.71
Total CHOL/HDL Ratio: 5
Triglycerides: 349 mg/dL — ABNORMAL HIGH (ref 0.0–149.0)
VLDL: 69.8 mg/dL — ABNORMAL HIGH (ref 0.0–40.0)

## 2018-05-06 LAB — CBC WITH DIFFERENTIAL/PLATELET
Basophils Absolute: 0 10*3/uL (ref 0.0–0.1)
Basophils Relative: 0.5 % (ref 0.0–3.0)
Eosinophils Absolute: 0.3 10*3/uL (ref 0.0–0.7)
Eosinophils Relative: 3 % (ref 0.0–5.0)
HCT: 41 % (ref 39.0–52.0)
Hemoglobin: 14.2 g/dL (ref 13.0–17.0)
Lymphocytes Relative: 30.5 % (ref 12.0–46.0)
Lymphs Abs: 2.8 10*3/uL (ref 0.7–4.0)
MCHC: 34.7 g/dL (ref 30.0–36.0)
MCV: 92.3 fl (ref 78.0–100.0)
Monocytes Absolute: 0.6 10*3/uL (ref 0.1–1.0)
Monocytes Relative: 6.3 % (ref 3.0–12.0)
Neutro Abs: 5.5 10*3/uL (ref 1.4–7.7)
Neutrophils Relative %: 59.7 % (ref 43.0–77.0)
Platelets: 242 10*3/uL (ref 150.0–400.0)
RBC: 4.44 Mil/uL (ref 4.22–5.81)
RDW: 13 % (ref 11.5–15.5)
WBC: 9.2 10*3/uL (ref 4.0–10.5)

## 2018-05-06 LAB — BASIC METABOLIC PANEL
BUN: 17 mg/dL (ref 6–23)
CO2: 28 mEq/L (ref 19–32)
Calcium: 9.8 mg/dL (ref 8.4–10.5)
Chloride: 103 mEq/L (ref 96–112)
Creatinine, Ser: 1.26 mg/dL (ref 0.40–1.50)
GFR: 63.5 mL/min (ref >60.00)
Glucose, Bld: 179 mg/dL — ABNORMAL HIGH (ref 70–99)
Potassium: 4 mEq/L (ref 3.5–5.1)
Sodium: 140 mEq/L (ref 135–145)

## 2018-05-06 LAB — HEMOGLOBIN A1C: Hgb A1c MFr Bld: 7.6 % — ABNORMAL HIGH (ref 4.6–6.5)

## 2018-05-06 LAB — HEPATIC FUNCTION PANEL
ALT: 24 U/L (ref 0–53)
AST: 18 U/L (ref 0–37)
Albumin: 4.4 g/dL (ref 3.5–5.2)
Alkaline Phosphatase: 41 U/L (ref 39–117)
Bilirubin, Direct: 0.1 mg/dL (ref 0.0–0.3)
Total Bilirubin: 0.4 mg/dL (ref 0.2–1.2)
Total Protein: 6.9 g/dL (ref 6.0–8.3)

## 2018-05-06 LAB — LDL CHOLESTEROL, DIRECT: Direct LDL: 90 mg/dL

## 2018-05-06 LAB — TSH: TSH: 2.62 u[IU]/mL (ref 0.35–4.50)

## 2018-05-12 ENCOUNTER — Encounter: Payer: Self-pay | Admitting: General Practice

## 2018-06-03 ENCOUNTER — Other Ambulatory Visit: Payer: Self-pay | Admitting: Family Medicine

## 2018-06-25 ENCOUNTER — Other Ambulatory Visit: Payer: Self-pay | Admitting: Family Medicine

## 2018-07-01 ENCOUNTER — Other Ambulatory Visit: Payer: Self-pay | Admitting: Family Medicine

## 2018-07-29 ENCOUNTER — Other Ambulatory Visit: Payer: Self-pay | Admitting: Family Medicine

## 2018-08-27 ENCOUNTER — Other Ambulatory Visit: Payer: Self-pay | Admitting: Family Medicine

## 2018-09-23 ENCOUNTER — Encounter: Payer: Self-pay | Admitting: Family Medicine

## 2018-09-24 ENCOUNTER — Encounter: Payer: BC Managed Care – PPO | Admitting: Family Medicine

## 2018-10-02 ENCOUNTER — Encounter: Payer: Self-pay | Admitting: Family Medicine

## 2018-10-02 ENCOUNTER — Ambulatory Visit: Payer: BC Managed Care – PPO | Admitting: Family Medicine

## 2018-10-25 ENCOUNTER — Other Ambulatory Visit: Payer: Self-pay | Admitting: Family Medicine

## 2018-10-27 NOTE — Telephone Encounter (Signed)
Last OV 04/16/18 farxiga last filled 04/01/18 #30 with 6 Robaxin last filled 06/03/18 #45 with 1 Zolpidem last filled 01/08/18 #30 with 3

## 2018-11-06 ENCOUNTER — Other Ambulatory Visit: Payer: Self-pay | Admitting: General Practice

## 2018-11-06 MED ORDER — ZOLPIDEM TARTRATE 5 MG PO TABS: 5.0000 mg | ORAL_TABLET | Freq: Every evening | ORAL | 3 refills | Status: DC | PRN

## 2018-11-06 NOTE — Telephone Encounter (Signed)
Last OV 04/16/18 Zolpidem last filled 10/27/18 #30 with 3  Called pharmacy, they advised that they never received this Rx.

## 2018-11-24 ENCOUNTER — Other Ambulatory Visit: Payer: Self-pay | Admitting: Family Medicine

## 2018-12-23 ENCOUNTER — Other Ambulatory Visit: Payer: Self-pay | Admitting: Family Medicine

## 2019-01-01 ENCOUNTER — Other Ambulatory Visit: Payer: Self-pay

## 2019-01-01 ENCOUNTER — Encounter: Payer: Self-pay | Admitting: Family Medicine

## 2019-01-01 ENCOUNTER — Ambulatory Visit: Payer: BC Managed Care – PPO | Admitting: Family Medicine

## 2019-01-01 VITALS — BP 130/81 | HR 78 | Temp 98.1°F | Resp 16 | Ht 74.0 in | Wt 247.2 lb

## 2019-01-01 DIAGNOSIS — E119 Type 2 diabetes mellitus without complications: Secondary | ICD-10-CM

## 2019-01-01 DIAGNOSIS — M546 Pain in thoracic spine: Secondary | ICD-10-CM

## 2019-01-01 DIAGNOSIS — M1A09X0 Idiopathic chronic gout, multiple sites, without tophus (tophi): Secondary | ICD-10-CM

## 2019-01-01 LAB — CBC WITH DIFFERENTIAL/PLATELET
Basophils Absolute: 0.1 10*3/uL (ref 0.0–0.1)
Basophils Relative: 0.7 % (ref 0.0–3.0)
Eosinophils Absolute: 0.3 10*3/uL (ref 0.0–0.7)
Eosinophils Relative: 2.8 % (ref 0.0–5.0)
HCT: 46.5 % (ref 39.0–52.0)
Hemoglobin: 16.1 g/dL (ref 13.0–17.0)
Lymphocytes Relative: 27.1 % (ref 12.0–46.0)
Lymphs Abs: 2.7 10*3/uL (ref 0.7–4.0)
MCHC: 34.6 g/dL (ref 30.0–36.0)
MCV: 93.3 fl (ref 78.0–100.0)
Monocytes Absolute: 0.6 10*3/uL (ref 0.1–1.0)
Monocytes Relative: 6 % (ref 3.0–12.0)
Neutro Abs: 6.2 10*3/uL (ref 1.4–7.7)
Neutrophils Relative %: 63.4 % (ref 43.0–77.0)
Platelets: 255 10*3/uL (ref 150.0–400.0)
RBC: 4.98 Mil/uL (ref 4.22–5.81)
RDW: 13.3 % (ref 11.5–15.5)
WBC: 9.8 10*3/uL (ref 4.0–10.5)

## 2019-01-01 LAB — LIPID PANEL
Cholesterol: 197 mg/dL (ref 0–200)
HDL: 35.3 mg/dL — ABNORMAL LOW (ref >39.00)
Total CHOL/HDL Ratio: 6
Triglycerides: 439 mg/dL — ABNORMAL HIGH (ref 0.0–149.0)

## 2019-01-01 LAB — URIC ACID: Uric Acid, Serum: 4.1 mg/dL (ref 4.0–7.8)

## 2019-01-01 LAB — BASIC METABOLIC PANEL
BUN: 17 mg/dL (ref 6–23)
CO2: 25 mEq/L (ref 19–32)
Calcium: 10.2 mg/dL (ref 8.4–10.5)
Chloride: 101 mEq/L (ref 96–112)
Creatinine, Ser: 1.11 mg/dL (ref 0.40–1.50)
GFR: 68.99 mL/min (ref >60.00)
Glucose, Bld: 151 mg/dL — ABNORMAL HIGH (ref 70–99)
Potassium: 4.4 mEq/L (ref 3.5–5.1)
Sodium: 137 mEq/L (ref 135–145)

## 2019-01-01 LAB — HEPATIC FUNCTION PANEL
ALT: 49 U/L (ref 0–53)
AST: 55 U/L — ABNORMAL HIGH (ref 0–37)
Albumin: 5.1 g/dL (ref 3.5–5.2)
Alkaline Phosphatase: 50 U/L (ref 39–117)
Bilirubin, Direct: 0.1 mg/dL (ref 0.0–0.3)
Total Bilirubin: 0.7 mg/dL (ref 0.2–1.2)
Total Protein: 7.5 g/dL (ref 6.0–8.3)

## 2019-01-01 LAB — TSH: TSH: 1.6 u[IU]/mL (ref 0.35–4.50)

## 2019-01-01 LAB — LDL CHOLESTEROL, DIRECT: Direct LDL: 146 mg/dL

## 2019-01-01 LAB — HEMOGLOBIN A1C: Hgb A1c MFr Bld: 7.5 % — ABNORMAL HIGH (ref 4.6–6.5)

## 2019-01-01 MED ORDER — MELOXICAM 15 MG PO TABS: 15.0000 mg | ORAL_TABLET | Freq: Every day | ORAL | 0 refills | Status: DC

## 2019-01-01 MED ORDER — ALLOPURINOL 300 MG PO TABS: 600.0000 mg | ORAL_TABLET | Freq: Every day | ORAL | 6 refills | Status: DC

## 2019-01-01 NOTE — Progress Notes (Signed)
   Subjective:    Patient ID: Jason Floyd, male    DOB: Nov 10, 1966, 53 y.o.   MRN: [id number]  HPI DM- chronic problem, on Farxiga 5mg  daily, Metformin 1000mg  w/ hx of adequate control.  On ARB for renal protection, UTD on eye exam.  Due for foot exam.  Pt is down 7 lbs since last visit.  Pt is questioning whether he has numbness in feet.  Gout- Pt is on Allopurinol 450mg  daily.  Had attack in foot in December.  Increased allopurinol to 600mg  daily.  Wants to make sure the new dose is ok.  Back pain- sxs started 2-3 weeks ago.  Thoracic back pain.  No known injury.  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  Pain is midline that travels to the R.  Improves w/ Methocarbamol and sleep.  + stress recently.   Review of Systems For ROS see HPI     Objective:   Physical Exam Vitals signs reviewed.  Constitutional:      General: He is not in acute distress.    Appearance: He is well-developed.  HENT:     Head: Normocephalic and atraumatic.  Eyes:     Conjunctiva/sclera: Conjunctivae normal.     Pupils: Pupils are equal, round, and reactive to light.  Neck:     Musculoskeletal: Normal range of motion and neck supple.     Thyroid: No thyromegaly.  Cardiovascular:     Rate and Rhythm: Normal rate and regular rhythm.     Heart sounds: Normal heart sounds. No murmur.  Pulmonary:     Effort: Pulmonary effort is normal. No respiratory distress.     Breath sounds: Normal breath sounds.  Abdominal:     General: Bowel sounds are normal. There is no distension.     Palpations: Abdomen is soft.  Musculoskeletal:        General: Tenderness (TTP over R lat) present. No swelling or deformity.  Lymphadenopathy:     Cervical: No cervical adenopathy.  Skin:    General: Skin is warm and dry.  Neurological:     Mental Status: He is alert and oriented to person, place, and time.     Cranial Nerves: No cranial nerve deficit.  Psychiatric:        Behavior:  Behavior normal.           Assessment & Plan:

## 2019-01-01 NOTE — Patient Instructions (Addendum)
Follow up in 3-4 months to recheck diabetes We'll notify you of your lab results and make any changes if needed Continue to work on healthy diet and regular exercise- you are down 7 lbs! Continue the higher dose (600mg ) of Allopurinol- I will send in a new prescription to reflect the increased dose START the once daily Meloxicam for pain and inflammation.  Take for 10-14 days and then as needed Heat, massage, stretching will all improve the muscle strain The sensation in your feet is fine! Call with any questions or concerns Have a great weekend!

## 2019-01-02 ENCOUNTER — Other Ambulatory Visit: Payer: Self-pay | Admitting: Family Medicine

## 2019-01-02 NOTE — Assessment & Plan Note (Signed)
Pt had an attack in December and increased Allopurinol to 600mg  daily.  Check Uric Acid level.  Prescription sent to reflect increased dose

## 2019-01-02 NOTE — Assessment & Plan Note (Signed)
Chronic problem.  UTD on eye exam, on ARB for renal protection.  Foot exam done today- no neuropathy present.  Check labs.  Adjust meds prn

## 2019-01-02 NOTE — Assessment & Plan Note (Signed)
Recurrent issue for pt.  Muscular in nature- improves w/ heat, methocarbamol.  Start once daily Meloxicam and monitor for improvement.  Pt expressed understanding and is in agreement w/ plan.

## 2019-01-15 ENCOUNTER — Ambulatory Visit: Payer: BC Managed Care – PPO | Admitting: Family Medicine

## 2019-01-21 ENCOUNTER — Other Ambulatory Visit: Payer: Self-pay | Admitting: Family Medicine

## 2019-01-26 ENCOUNTER — Encounter: Payer: Self-pay | Admitting: Family Medicine

## 2019-01-31 ENCOUNTER — Other Ambulatory Visit: Payer: Self-pay | Admitting: Family Medicine

## 2019-02-19 ENCOUNTER — Other Ambulatory Visit: Payer: Self-pay | Admitting: Family Medicine

## 2019-03-05 ENCOUNTER — Other Ambulatory Visit: Payer: Self-pay | Admitting: Family Medicine

## 2019-03-30 ENCOUNTER — Other Ambulatory Visit: Payer: Self-pay | Admitting: Family Medicine

## 2019-04-20 ENCOUNTER — Other Ambulatory Visit: Payer: Self-pay | Admitting: Family Medicine

## 2019-04-29 ENCOUNTER — Other Ambulatory Visit: Payer: Self-pay | Admitting: Family Medicine

## 2019-04-30 ENCOUNTER — Other Ambulatory Visit: Payer: Self-pay

## 2019-04-30 ENCOUNTER — Encounter: Payer: Self-pay | Admitting: Family Medicine

## 2019-04-30 ENCOUNTER — Ambulatory Visit (INDEPENDENT_AMBULATORY_CARE_PROVIDER_SITE_OTHER): Payer: BC Managed Care – PPO | Admitting: Family Medicine

## 2019-04-30 VITALS — BP 121/81 | HR 78 | Temp 98.0°F | Resp 16 | Ht 74.0 in | Wt 253.4 lb

## 2019-04-30 DIAGNOSIS — E119 Type 2 diabetes mellitus without complications: Secondary | ICD-10-CM | POA: Diagnosis not present

## 2019-04-30 DIAGNOSIS — Z0000 Encounter for general adult medical examination without abnormal findings: Secondary | ICD-10-CM

## 2019-04-30 DIAGNOSIS — Z125 Encounter for screening for malignant neoplasm of prostate: Secondary | ICD-10-CM

## 2019-04-30 LAB — CBC WITH DIFFERENTIAL/PLATELET
Basophils Absolute: 0.1 10*3/uL (ref 0.0–0.1)
Basophils Relative: 0.9 % (ref 0.0–3.0)
Eosinophils Absolute: 0.3 10*3/uL (ref 0.0–0.7)
Eosinophils Relative: 3 % (ref 0.0–5.0)
HCT: 45.9 % (ref 39.0–52.0)
Hemoglobin: 15.7 g/dL (ref 13.0–17.0)
Lymphocytes Relative: 29.5 % (ref 12.0–46.0)
Lymphs Abs: 3 10*3/uL (ref 0.7–4.0)
MCHC: 34.2 g/dL (ref 30.0–36.0)
MCV: 93.8 fl (ref 78.0–100.0)
Monocytes Absolute: 0.7 10*3/uL (ref 0.1–1.0)
Monocytes Relative: 6.3 % (ref 3.0–12.0)
Neutro Abs: 6.2 10*3/uL (ref 1.4–7.7)
Neutrophils Relative %: 60.3 % (ref 43.0–77.0)
Platelets: 258 10*3/uL (ref 150.0–400.0)
RBC: 4.9 Mil/uL (ref 4.22–5.81)
RDW: 12.8 % (ref 11.5–15.5)
WBC: 10.3 10*3/uL (ref 4.0–10.5)

## 2019-04-30 LAB — HEPATIC FUNCTION PANEL
ALT: 38 U/L (ref 0–53)
AST: 23 U/L (ref 0–37)
Albumin: 4.7 g/dL (ref 3.5–5.2)
Alkaline Phosphatase: 53 U/L (ref 39–117)
Bilirubin, Direct: 0.1 mg/dL (ref 0.0–0.3)
Total Bilirubin: 0.7 mg/dL (ref 0.2–1.2)
Total Protein: 7 g/dL (ref 6.0–8.3)

## 2019-04-30 LAB — BASIC METABOLIC PANEL
BUN: 17 mg/dL (ref 6–23)
CO2: 27 mEq/L (ref 19–32)
Calcium: 10 mg/dL (ref 8.4–10.5)
Chloride: 100 mEq/L (ref 96–112)
Creatinine, Ser: 1.05 mg/dL (ref 0.40–1.50)
GFR: 73.46 mL/min (ref >60.00)
Glucose, Bld: 201 mg/dL — ABNORMAL HIGH (ref 70–99)
Potassium: 4.3 mEq/L (ref 3.5–5.1)
Sodium: 136 mEq/L (ref 135–145)

## 2019-04-30 LAB — LDL CHOLESTEROL, DIRECT: Direct LDL: 79 mg/dL

## 2019-04-30 LAB — TSH: TSH: 2.01 u[IU]/mL (ref 0.35–4.50)

## 2019-04-30 LAB — LIPID PANEL
Cholesterol: 186 mg/dL (ref 0–200)
HDL: 32 mg/dL — ABNORMAL LOW (ref >39.00)
Total CHOL/HDL Ratio: 6
Triglycerides: 646 mg/dL — ABNORMAL HIGH (ref 0.0–149.0)

## 2019-04-30 LAB — HEMOGLOBIN A1C: Hgb A1c MFr Bld: 8.1 % — ABNORMAL HIGH (ref 4.6–6.5)

## 2019-04-30 LAB — PSA: PSA: 0.56 ng/mL (ref 0.10–4.00)

## 2019-04-30 NOTE — Assessment & Plan Note (Signed)
Chronic problem.  UTD on foot exam, on ARB for renal protection.  Eye exam is scheduled.  Check labs.  Adjust meds prn

## 2019-04-30 NOTE — Assessment & Plan Note (Signed)
Pt's PE WNL w/ exception of obesity.  UTD on immunizations, colonoscopy.  Check labs.  Anticipatory guidance provided.  

## 2019-04-30 NOTE — Progress Notes (Signed)
   Subjective:    Patient ID: Jason Floyd, male    DOB: 08-31-1966, 53 y.o.   MRN: [id number]  HPI  CPE- pt is UTD on colonoscopy, foot exam, immunizations.  On ARB for renal protection.  Has gained 7 lbs since last visit.  Eye exam scheduled.   Review of Systems Patient reports no vision/hearing changes, anorexia, fever ,adenopathy, persistant/recurrent hoarseness, swallowing issues, chest pain, palpitations, edema, persistant/recurrent cough, hemoptysis, dyspnea (rest,exertional, paroxysmal nocturnal), gastrointestinal  bleeding (melena, rectal bleeding), abdominal pain, excessive heart burn, GU symptoms (dysuria, hematuria, voiding/incontinence issues) syncope, focal weakness, memory loss, numbness & tingling, skin/hair/nail changes, depression, anxiety, abnormal bruising/bleeding, musculoskeletal symptoms/signs.     Objective:   Physical Exam General Appearance:    Alert, cooperative, no distress, appears stated age  Head:    Normocephalic, without obvious abnormality, atraumatic  Eyes:    PERRL, conjunctiva/corneas clear, EOM's intact, fundi    benign, both eyes       Ears:    Normal TM's and external ear canals, both ears  Nose:   Nares normal, septum midline, mucosa normal, no drainage   or sinus tenderness  Throat:   Lips, mucosa, and tongue normal; teeth and gums normal  Neck:   Supple, symmetrical, trachea midline, no adenopathy;       thyroid:  No enlargement/tenderness/nodules  Back:     Symmetric, no curvature, ROM normal, no CVA tenderness  Lungs:     Clear to auscultation bilaterally, respirations unlabored  Chest wall:    No tenderness or deformity  Heart:    Regular rate and rhythm, S1 and S2 normal, no murmur, rub   or gallop  Abdomen:     Soft, non-tender, bowel sounds active all four quadrants,    no masses, no organomegaly  Genitalia:    Deferred at pt's request  Rectal:    Extremities:   Extremities normal, atraumatic, no cyanosis or edema  Pulses:   2+ and  symmetric all extremities  Skin:   Skin color, texture, turgor normal, no rashes or lesions  Lymph nodes:   Cervical, supraclavicular, and axillary nodes normal  Neurologic:   CNII-XII intact. Normal strength, sensation and reflexes      throughout          Assessment & Plan:

## 2019-04-30 NOTE — Patient Instructions (Signed)
Follow up in 3-4 months to recheck sugars We'll notify you of your lab results and make any changes if needed Continue to work on healthy diet and regular exercise- you can do it! Have them send me a copy of your eye exam Call with any questions or concerns Stay Safe!!!

## 2019-05-01 ENCOUNTER — Other Ambulatory Visit: Payer: Self-pay

## 2019-05-01 ENCOUNTER — Encounter: Payer: Self-pay | Admitting: Family Medicine

## 2019-05-01 MED ORDER — METFORMIN HCL 1000 MG PO TABS: 1000.0000 mg | ORAL_TABLET | Freq: Two times a day (BID) | ORAL | 0 refills | Status: DC

## 2019-05-14 LAB — HM DIABETES EYE EXAM

## 2019-05-18 ENCOUNTER — Other Ambulatory Visit: Payer: Self-pay | Admitting: Family Medicine

## 2019-05-20 ENCOUNTER — Encounter: Payer: BC Managed Care – PPO | Admitting: Family Medicine

## 2019-05-22 ENCOUNTER — Encounter: Payer: Self-pay | Admitting: General Practice

## 2019-05-29 ENCOUNTER — Other Ambulatory Visit: Payer: Self-pay | Admitting: Family Medicine

## 2019-05-29 NOTE — Telephone Encounter (Signed)
Last OV 04/30/19 Robaxin last filled 04/29/19 #45 with 1 meloxicam last filled 04/29/19 #30with 0 ambien last filled 11/06/18 #30 with 3 Farxiga last filled 10/27/18 #30 with 6

## 2019-06-12 ENCOUNTER — Other Ambulatory Visit: Payer: Self-pay | Admitting: Family Medicine

## 2019-06-23 ENCOUNTER — Other Ambulatory Visit: Payer: Self-pay | Admitting: Family Medicine

## 2019-07-24 ENCOUNTER — Telehealth: Payer: Self-pay

## 2019-07-24 NOTE — Telephone Encounter (Signed)
Patient reports episodes of chest tightness with tingling arm sporadically x 2 months. Last episode last night. Denies SOB, dizziness or diaphoresis. Patient hesitant to go to ER since he is asymptomatic at this time. Offered appt with PCP, patient prefers to wait until his scheduled appt on 9/23. Advised if he experiences symptoms again, to go to ER. Patient verbalizes understanding and agrees.

## 2019-07-27 ENCOUNTER — Telehealth (HOSPITAL_COMMUNITY): Payer: Self-pay | Admitting: *Deleted

## 2019-07-27 NOTE — Telephone Encounter (Signed)
Pt left VM requesting an appt with Dr.Bensimhon. pt states he had congestive heart failure and feels he needs to be evaluated by Dr.Benisimhon. Pt has not been seen in our office since 2014. I am not sure if patient is a candidate for our clinic or if he should be seen in general cardiology.  Routed to [employer] for advice

## 2019-07-29 NOTE — Telephone Encounter (Signed)
Dr Haroldine Laws is agreeable to see pt again and would like him to have an echo done prior if insurance will cover, Delana Meyer can you check on this please, thanks

## 2019-07-30 ENCOUNTER — Other Ambulatory Visit: Payer: Self-pay | Admitting: Family Medicine

## 2019-07-31 ENCOUNTER — Telehealth (HOSPITAL_COMMUNITY): Payer: Self-pay | Admitting: Vascular Surgery

## 2019-07-31 NOTE — Telephone Encounter (Signed)
Left pt message giving new pt appt w/ echo 10/6, asked pt to call back to confirm appt

## 2019-08-05 ENCOUNTER — Encounter: Payer: Self-pay | Admitting: Family Medicine

## 2019-08-05 ENCOUNTER — Other Ambulatory Visit: Payer: Self-pay

## 2019-08-05 ENCOUNTER — Ambulatory Visit: Payer: BC Managed Care – PPO | Admitting: Family Medicine

## 2019-08-05 VITALS — BP 122/86 | HR 90 | Temp 97.9°F | Resp 17 | Ht 74.0 in | Wt 252.1 lb

## 2019-08-05 DIAGNOSIS — E119 Type 2 diabetes mellitus without complications: Secondary | ICD-10-CM

## 2019-08-05 DIAGNOSIS — K219 Gastro-esophageal reflux disease without esophagitis: Secondary | ICD-10-CM | POA: Insufficient documentation

## 2019-08-05 DIAGNOSIS — Z23 Encounter for immunization: Secondary | ICD-10-CM

## 2019-08-05 MED ORDER — PANTOPRAZOLE SODIUM 40 MG PO TBEC: 40.0000 mg | DELAYED_RELEASE_TABLET | Freq: Every day | ORAL | 3 refills | Status: DC

## 2019-08-05 NOTE — Assessment & Plan Note (Signed)
New.  Pt's sxs are consistent w/ GERD/esophageal spasm.  Discussed dietary and lifestyle modifications that will improve sxs.  Will start PPI.  Discussed that if he develops a pressure associated w/ SOB, diaphoresis, N/V, pain in neck/shoulder he needs to go to ER.  Pt expressed understanding and is in agreement w/ plan.

## 2019-08-05 NOTE — Patient Instructions (Addendum)
Follow up in 3-4 months to recheck diabetes, BP, and cholesterol We'll notify you of your lab results and make any changes if needed START the Pantoprazole once daily to decrease acid production Try and avoid eating and then lying down Try and avoid your stomach being empty- graze throughout the day Call with any questions or concerns Stay Safe!  Stay Sane!!!

## 2019-08-05 NOTE — Progress Notes (Signed)
   Subjective:    Patient ID: Jason Floyd, male    DOB: 03-22-1966, 53 y.o.   MRN: [id number]  HPI DM- chronic problem, on Farxiga 5mg  daily, Metformin 1000mg  BID.  Last A1C 8.1  UTD on foot exam, eye exam.  On ARB for renal protection.  + CP (see below).  No SOB, HAs, visual changes, abd pain, edema.  No numbness/tingling of hands/feet.  No symptomatic lows.  GERD- sxs started ~2 months ago.  Started w/ pain in L chest, shoulder, and neck.  Pt thought it was muscular- stretched and got a massage and 'it was fine'.  Subsequently developed 'big sharp pain underneath my rib cage'.  Lasted ~15 minutes.  + nausea, burping.  Improved w/ Tums.  Has appt upcoming w/ cardiology.  Pt reports all episodes occurred after largest meal of the day when lying down.   Review of Systems For ROS see HPI     Objective:   Physical Exam Vitals signs reviewed.  Constitutional:      General: He is not in acute distress.    Appearance: He is well-developed. He is obese.  HENT:     Head: Normocephalic and atraumatic.  Eyes:     Conjunctiva/sclera: Conjunctivae normal.     Pupils: Pupils are equal, round, and reactive to light.  Neck:     Musculoskeletal: Normal range of motion and neck supple.     Thyroid: No thyromegaly.  Cardiovascular:     Rate and Rhythm: Normal rate and regular rhythm.     Heart sounds: Normal heart sounds. No murmur.  Pulmonary:     Effort: Pulmonary effort is normal. No respiratory distress.     Breath sounds: Normal breath sounds.  Abdominal:     General: Bowel sounds are normal. There is no distension.     Palpations: Abdomen is soft.  Lymphadenopathy:     Cervical: No cervical adenopathy.  Skin:    General: Skin is warm and dry.  Neurological:     Mental Status: He is alert and oriented to person, place, and time.     Cranial Nerves: No cranial nerve deficit.  Psychiatric:        Behavior: Behavior normal.           Assessment & Plan:

## 2019-08-05 NOTE — Assessment & Plan Note (Signed)
Chronic problem.  Last A1C not well controlled.  Asymptomatic.  UTD on foot exam, eye exam.  On ARB for renal protection.  Check labs.  Adjust meds prn

## 2019-08-06 LAB — BASIC METABOLIC PANEL
BUN: 17 mg/dL (ref 6–23)
CO2: 29 mEq/L (ref 19–32)
Calcium: 10.6 mg/dL — ABNORMAL HIGH (ref 8.4–10.5)
Chloride: 99 mEq/L (ref 96–112)
Creatinine, Ser: 1.4 mg/dL (ref 0.40–1.50)
GFR: 52.66 mL/min — ABNORMAL LOW (ref >60.00)
Glucose, Bld: 200 mg/dL — ABNORMAL HIGH (ref 70–99)
Potassium: 4.4 mEq/L (ref 3.5–5.1)
Sodium: 138 mEq/L (ref 135–145)

## 2019-08-06 LAB — HEMOGLOBIN A1C: Hgb A1c MFr Bld: 7.6 % — ABNORMAL HIGH (ref 4.6–6.5)

## 2019-08-07 ENCOUNTER — Encounter: Payer: Self-pay | Admitting: General Practice

## 2019-08-18 ENCOUNTER — Other Ambulatory Visit: Payer: Self-pay

## 2019-08-18 ENCOUNTER — Ambulatory Visit (HOSPITAL_COMMUNITY)
Admission: RE | Admit: 2019-08-18 | Discharge: 2019-08-18 | Disposition: A | Discharge status: Home / Self Care | Payer: BC Managed Care – PPO | Source: Ambulatory Visit | Attending: Internal Medicine | Admitting: Internal Medicine

## 2019-08-18 ENCOUNTER — Ambulatory Visit (HOSPITAL_BASED_OUTPATIENT_CLINIC_OR_DEPARTMENT_OTHER)
Admission: RE | Admit: 2019-08-18 | Discharge: 2019-08-18 | Disposition: A | Discharge status: Home / Self Care | Payer: BC Managed Care – PPO | Source: Ambulatory Visit | Attending: Internal Medicine | Admitting: Internal Medicine

## 2019-08-18 ENCOUNTER — Encounter (HOSPITAL_COMMUNITY): Payer: Self-pay | Admitting: Internal Medicine

## 2019-08-18 VITALS — BP 129/65 | HR 82 | Wt 249.0 lb

## 2019-08-18 DIAGNOSIS — I340 Nonrheumatic mitral (valve) insufficiency: Secondary | ICD-10-CM | POA: Diagnosis not present

## 2019-08-18 DIAGNOSIS — I444 Left anterior fascicular block: Secondary | ICD-10-CM | POA: Diagnosis not present

## 2019-08-18 DIAGNOSIS — R0789 Other chest pain: Secondary | ICD-10-CM

## 2019-08-18 DIAGNOSIS — I5022 Chronic systolic (congestive) heart failure: Secondary | ICD-10-CM | POA: Diagnosis not present

## 2019-08-18 DIAGNOSIS — Z803 Family history of malignant neoplasm of breast: Secondary | ICD-10-CM | POA: Diagnosis not present

## 2019-08-18 DIAGNOSIS — I5043 Acute on chronic combined systolic (congestive) and diastolic (congestive) heart failure: Secondary | ICD-10-CM | POA: Diagnosis not present

## 2019-08-18 DIAGNOSIS — E785 Hyperlipidemia, unspecified: Secondary | ICD-10-CM | POA: Diagnosis not present

## 2019-08-18 DIAGNOSIS — Z86711 Personal history of pulmonary embolism: Secondary | ICD-10-CM | POA: Insufficient documentation

## 2019-08-18 DIAGNOSIS — F1921 Other psychoactive substance dependence, in remission: Secondary | ICD-10-CM | POA: Insufficient documentation

## 2019-08-18 DIAGNOSIS — Z79899 Other long term (current) drug therapy: Secondary | ICD-10-CM | POA: Diagnosis not present

## 2019-08-18 DIAGNOSIS — E119 Type 2 diabetes mellitus without complications: Secondary | ICD-10-CM | POA: Diagnosis not present

## 2019-08-18 DIAGNOSIS — M1990 Unspecified osteoarthritis, unspecified site: Secondary | ICD-10-CM | POA: Insufficient documentation

## 2019-08-18 DIAGNOSIS — Z791 Long term (current) use of non-steroidal anti-inflammatories (NSAID): Secondary | ICD-10-CM | POA: Insufficient documentation

## 2019-08-18 DIAGNOSIS — F17210 Nicotine dependence, cigarettes, uncomplicated: Secondary | ICD-10-CM | POA: Diagnosis not present

## 2019-08-18 DIAGNOSIS — R9431 Abnormal electrocardiogram [ECG] [EKG]: Secondary | ICD-10-CM | POA: Insufficient documentation

## 2019-08-18 DIAGNOSIS — R079 Chest pain, unspecified: Secondary | ICD-10-CM | POA: Diagnosis not present

## 2019-08-18 DIAGNOSIS — Z7984 Long term (current) use of oral hypoglycemic drugs: Secondary | ICD-10-CM | POA: Diagnosis not present

## 2019-08-18 DIAGNOSIS — M109 Gout, unspecified: Secondary | ICD-10-CM | POA: Diagnosis not present

## 2019-08-18 NOTE — Patient Instructions (Signed)
Your physician has requested that you have cardiac CT. Cardiac computed tomography (CT) is a painless test that uses an x-ray machine to take clear, detailed pictures of your heart. For further information please visit [email or web address]. Please follow instruction sheet as given.  ONCE THIS IS APPROVED BY YOUR INSURANCE YOU WILL BE CONTACTED TO SCHEDULE THIS.  Your physician recommends that you schedule a follow-up appointment in: [place] MARCH 2021 TO [place].  At the [facility], you and your health needs are our priority. As part of our continuing mission to provide you with exceptional heart care, we have created designated Provider Care Teams. These Care Teams include your primary Cardiologist (physician) and Advanced Practice Providers (APPs- Physician Assistants and Nurse Practitioners) who all work together to provide you with the care you need, when you need it.   You may see any of the following providers on your designated Care Team at your next follow up: Marland Kitchen Dr Glori Bickers . Dr Loralie Champagne . Darrick Grinder, NP   Please be sure to bring in all your medications bottles to every appointment.

## 2019-08-18 NOTE — Progress Notes (Signed)
  Echocardiogram 2D Echocardiogram has been performed.  Jason Floyd 08/18/2019, 10:12 AM

## 2019-08-18 NOTE — Progress Notes (Signed)
[facility] NEW PATIENT NOTE    HPI:  [name] is 53 year old with a PMH of severe systolic heart failure due to presumed NICM (no cardiac cath), DM2, former polysubstance abuse, S/P discectomy and spinal fusion 2/-/2014, and former tobacco ( quit 2014). He has had recovery of LV function for several years.   ECHO 12/2007 EF 55-60%  We have not seen him since for 2014. He returns today for further evaluation of CP. Says started having pain several months ago. Sharp pain in left chest mostly at night and in am. Also has some pain/tingling in left arm from previous diskectomy. Active with his dogs without CP though not as active as previous. Has been taking his hear meds regularly. Continues to smoke 1-2 cigs. Will drink a bunch every other weekend or so. Started Protonix and CP much better but not completely resolved   Echo today EF 55-60% mo RWMA. Personally reviewed  Review of Systems: [y] = yes, [ ]  = no   General: Weight gain [ ] ; Weight loss [ ] ; Anorexia [ ] ; Fatigue [ ] ; Fever [ ] ; Chills [ ] ; Weakness [ ]   Cardiac: Chest pain/pressure [ y; Resting SOB [ ] ; Exertional SOB [ ] ; Orthopnea [ ] ; Pedal Edema [ ] ; Palpitations [ ] ; Syncope [ ] ; Presyncope [ ] ; Paroxysmal nocturnal dyspnea[ ]   Pulmonary: Cough [ ] ; Wheezing[ ] ; Hemoptysis[ ] ; Sputum [ ] ; Snoring [ ]   GI: Vomiting[ ] ; Dysphagia[ ] ; Melena[ ] ; Hematochezia [ ] ; Heartburn[ ] ; Abdominal pain [ ] ; Constipation [ ] ; Diarrhea [ ] ; BRBPR [ ]   GU: Hematuria[ ] ; Dysuria [ ] ; Nocturia[ ]   Vascular: Pain in legs with walking [ ] ; Pain in feet with lying flat [ ] ; Non-healing sores [ ] ; Stroke [ ] ; TIA [ ] ; Slurred speech [ ] ;  Neuro: Headaches[ ] ; Vertigo[ ] ; Seizures[ ] ; Paresthesias[ ] ;Blurred vision [ ] ; Diplopia [ ] ; Vision changes [ ]   Ortho/Skin: Arthritis [ y]; Joint pain ]; Muscle pain [ ] ; Joint swelling [ ] ; Back Pain ]; Rash [ ]   Psych: Depression[ ] ; Anxiety[ ]   Heme: Bleeding problems [ ] ; Clotting  disorders [ ] ; Anemia [ ]   Endocrine: Diabetes [ y]; Thyroid dysfunction[ ]    Past Medical History:  Diagnosis Date  . Allergy   . Arthritis   . CHF (congestive heart failure) (HCC)   . Diabetes mellitus without complication (HCC)   . Drug abuse (HCC)   . Eczema   . Gout   . Hyperlipidemia   . Nephrolithiasis   . PE (pulmonary embolism)     Current Outpatient Medications  Medication Sig Dispense Refill  . allopurinol (ZYLOPRIM) 300 MG tablet Take 2 tablets (600 mg total) by mouth daily. 60 tablet 6  . atorvastatin (LIPITOR) 20 MG tablet TAKE 1 TABLET BY MOUTH DAILY. 30 tablet 6  . carvedilol (COREG) 12.5 MG tablet TAKE 1 TABLET BY MOUTH 2 TIMES DAILY WITH A MEAL. 180 tablet 1  . cetirizine (ZYRTEC) 10 MG tablet Take 10 mg by mouth daily.    FARXIGA 5 MG TABS tablet TAKE 1 TABLET BY MOUTH DAILY 30 tablet 6  . fenofibrate 160 MG tablet TAKE 1 TABLET BY MOUTH DAILY. 90 tablet 1  . losartan (COZAAR) 100 MG tablet TAKE 1 TABLET BY MOUTH DAILY. TAKE IN PLACE OF VALSARTAN DUE TO RECALL. 30 tablet 6  . meloxicam (MOBIC) 15 MG tablet TAKE 1 TABLET BY MOUTH DAILY. 30 tablet 0  .  metFORMIN (GLUCOPHAGE) 1000 MG tablet TAKE 1 TABLET BY MOUTH 2 TIMES DAILY WITH A MEAL. 180 tablet 0  . methocarbamol (ROBAXIN) 500 MG tablet TAKE 1 TABLET BY MOUTH 3 TIMES DAILY. 45 tablet 1  . pantoprazole (PROTONIX) 40 MG tablet Take 1 tablet (40 mg total) by mouth daily. 30 tablet 3  . traZODone (DESYREL) 100 MG tablet TAKE 1 TABLET BY MOUTH AT BEDTIME. 30 tablet 6  . triamcinolone (KENALOG) 0.025 % ointment APPLY TO THE AFFECTED AREA 2 TIMES DAILY. 80 g 1  . zolpidem (AMBIEN) 5 MG tablet TAKE 1 TABLET BY MOUTH AT BEDTIME AS NEEDED FOR SLEEP 30 tablet 3   No current facility-administered medications for this encounter.     No Known Allergies    Social History   Socioeconomic History  . Marital status: Married    Spouse name: Not on file  . Number of children: Not on file  . Years of education: Not  on file  . Highest education level: Not on file  Occupational History  . Not on file  Social Needs  . Financial resource strain: Not on file  . Food insecurity    Worry: Not on file    Inability: Not on file  . Transportation needs    Medical: Not on file    Non-medical: Not on file  Tobacco Use  . Smoking status: Current Every Day Smoker    Packs/day: 0.25    Years: 31.00    Pack years: 7.75    Types: Cigarettes  . Smokeless tobacco: Never Used  Substance and Sexual Activity  . Alcohol use: Yes    Alcohol/week: 0.0 standard drinks    Comment: occasionally  . Drug use: Yes    Types: Marijuana  . Sexual activity: Not on file  Lifestyle  . Physical activity    Days per week: Not on file    Minutes per session: Not on file  . Stress: Not on file  Relationships  . Social Musician on phone: Not on file    Gets together: Not on file    Attends religious service: Not on file    Active member of club or organization: Not on file    Attends meetings of clubs or organizations: Not on file    Relationship status: Not on file  . Intimate partner violence    Fear of current or ex partner: Not on file    Emotionally abused: Not on file    Physically abused: Not on file    Forced sexual activity: Not on file  Other Topics Concern  . Not on file  Social History Narrative  . Not on file      Family History  Problem Relation Age of Onset  . Breast cancer Maternal Aunt   . Colon cancer Neg Hx     Vitals:   08/18/19 1018  BP: 129/65  Pulse: 82  SpO2: 99%  Weight: 112.9 kg (249 lb)    PHYSICAL EXAM: General:  Well appearing. No respiratory difficulty HEENT: normal Neck: supple. no JVD. Carotids 2+ bilat; no bruits. No lymphadenopathy or thryomegaly appreciated. Cor: PMI nondisplaced. Regular rate & rhythm. No rubs, gallops or murmurs. Lungs: clear Abdomen: obese soft, nontender, nondistended. No hepatosplenomegaly. No bruits or masses. Good bowel sounds.  Extremities: no cyanosis, clubbing, rash, edema Neuro: alert & oriented x 3, cranial nerves grossly intact. moves all 4 extremities w/o difficulty. Affect pleasant.  ECG: NSR 83 with 1AVB ( ) .Non-specific  ST abnormalities Personally reviewed   ASSESSMENT & PLAN:  1. Chronic systolic HF - EF remains recovered. Echo today EF 55-60% Personally reviewed - continue current meds  2. Chest tightness - suspect may be GERD but multiple CRFs and not completely resolved with PPI - check cardiac CTA  3. Tobacco use - counseled on cessation  4. DM2 - continue SGLT2i, metformin   Glori Bickers, MD  11:01 AM

## 2019-09-01 ENCOUNTER — Other Ambulatory Visit: Payer: Self-pay | Admitting: Family Medicine

## 2019-09-03 ENCOUNTER — Encounter: Payer: Self-pay | Admitting: Family Medicine

## 2019-09-14 ENCOUNTER — Other Ambulatory Visit: Payer: Self-pay | Admitting: Family Medicine

## 2019-10-02 ENCOUNTER — Other Ambulatory Visit: Payer: Self-pay | Admitting: Family Medicine

## 2019-10-09 ENCOUNTER — Other Ambulatory Visit: Payer: Self-pay | Admitting: Family Medicine

## 2019-10-29 ENCOUNTER — Other Ambulatory Visit: Payer: Self-pay | Admitting: Family Medicine

## 2019-11-04 ENCOUNTER — Other Ambulatory Visit: Payer: Self-pay | Admitting: Family Medicine

## 2019-11-11 ENCOUNTER — Other Ambulatory Visit: Payer: Self-pay | Admitting: Family Medicine

## 2019-11-18 ENCOUNTER — Other Ambulatory Visit: Payer: Self-pay | Admitting: Family Medicine

## 2019-11-25 ENCOUNTER — Other Ambulatory Visit: Payer: Self-pay | Admitting: Family Medicine

## 2019-12-17 ENCOUNTER — Other Ambulatory Visit: Payer: Self-pay | Admitting: Family Medicine

## 2019-12-17 NOTE — Telephone Encounter (Signed)
Please advise? Is this to be a long term med?

## 2019-12-26 ENCOUNTER — Other Ambulatory Visit: Payer: Self-pay | Admitting: Family Medicine

## 2020-01-20 ENCOUNTER — Other Ambulatory Visit: Payer: Self-pay | Admitting: Family Medicine

## 2020-02-09 ENCOUNTER — Other Ambulatory Visit: Payer: Self-pay | Admitting: Family Medicine

## 2020-02-15 ENCOUNTER — Other Ambulatory Visit: Payer: Self-pay | Admitting: Family Medicine

## 2020-02-18 ENCOUNTER — Other Ambulatory Visit: Payer: Self-pay | Admitting: Family Medicine

## 2020-03-15 ENCOUNTER — Other Ambulatory Visit: Payer: Self-pay | Admitting: Family Medicine

## 2020-03-21 ENCOUNTER — Other Ambulatory Visit: Payer: Self-pay | Admitting: Physician Assistant

## 2020-03-21 NOTE — Telephone Encounter (Signed)
Robaxin last filled 02-15-20 #45 with 1 refill Last office visit 08/05/19 No future appt scheduled

## 2020-04-13 ENCOUNTER — Other Ambulatory Visit: Payer: Self-pay | Admitting: Family Medicine

## 2020-05-13 ENCOUNTER — Other Ambulatory Visit: Payer: Self-pay | Admitting: Family Medicine

## 2020-05-13 ENCOUNTER — Other Ambulatory Visit: Payer: Self-pay | Admitting: Physician Assistant

## 2020-05-13 NOTE — Telephone Encounter (Signed)
LFD 04/13/20 #45 with 1 refill LOV 08/05/19 NOV none

## 2020-05-17 ENCOUNTER — Telehealth: Payer: Self-pay | Admitting: Family Medicine

## 2020-05-17 NOTE — Telephone Encounter (Signed)
Patient called seeking NP appt w/ Dr.Fields ,he was referred by other provider to see him for Knee pain -- I offered pt appt (tried to double book w/ Dr. Darrick Penna but unable to callled Cierra fr asst but was advise Dr.Fields isn't taking NP )  --Called pt back to advise of status & left hm a msg to call office if will take NP appt w/Dr.Draper in 7/20 in the afternoon )  -Forwarding fyi to [place] if pt calls back.  --glh

## 2020-05-26 ENCOUNTER — Other Ambulatory Visit: Payer: Self-pay

## 2020-05-26 ENCOUNTER — Ambulatory Visit: Payer: BC Managed Care – PPO | Admitting: Sports Medicine

## 2020-05-26 ENCOUNTER — Ambulatory Visit
Admission: RE | Admit: 2020-05-26 | Discharge: 2020-05-26 | Disposition: A | Discharge status: Home / Self Care | Payer: BC Managed Care – PPO | Source: Ambulatory Visit | Attending: Sports Medicine | Admitting: Sports Medicine

## 2020-05-26 ENCOUNTER — Encounter: Payer: Self-pay | Admitting: Sports Medicine

## 2020-05-26 VITALS — BP 155/99 | Ht 74.0 in | Wt 240.0 lb

## 2020-05-26 DIAGNOSIS — M25561 Pain in right knee: Secondary | ICD-10-CM | POA: Diagnosis not present

## 2020-05-26 NOTE — Progress Notes (Addendum)
   Subjective:    Patient ID: Jason Floyd, male    DOB: 1965-12-23, 54 y.o.   MRN: [id number]  HPI chief complaint: Right knee pain  Very pleasant 54 year old male comes in today complaining of 2 to 3 months of medial sided right knee pain.  He denies any trauma but rather describes pain that began after several days of walking.  He applied ice and purchased a knee brace which has been helpful.  In fact his main complaint is not pain but rather mechanical symptoms.  He describes a catching and popping which is uncomfortable and occasional instability which causes his leg to want to give way.  He has not noticed any swelling.  He denies stiffness.  No significant pain at rest.  No prior injuries or surgeries to this knee.  He takes meloxicam chronically for left thumb CMC osteoarthritis.    Past medical history reviewed Medications reviewed Allergies reviewed    Review of Systems Above    Objective:   Physical Exam  Well-developed, well-nourished.  No acute distress.  Right knee: Full range of motion.  No effusion.  1+ patellofemoral crepitus.  There is no tenderness to palpation along medial or lateral joint lines.  Negative McMurray's, negative Thessaly's.  Knee is stable to valgus and varus stressing.  Negative anterior drawer, negative posterior drawer.  Neurovascularly intact distally.  Walking without a limp.  Limited MSK ultrasound of the right knee was performed.  Trace effusion is seen.  There is some spurring along the medial joint space but the visualized portion of the medial meniscus appears unremarkable.      Assessment & Plan:   Right knee instability likely secondary to degenerative medial meniscal tear  I discussed options including cortisone injection versus further diagnostic imaging in the form of an MRI to rule out a meniscal tear which may benefit from arthroscopy.  Since the patient is getting very little in the way of pain I do not think a cortisone injection  would be helpful.  If the MRI shows a meniscal tear then his mechanical symptoms may resolve with an arthroscopy.  I will start with getting x-rays of the knee and we will call him with those results when available.  In the meantime we have given him single-leg straight leg raise exercises to work on quad strengthening and he will continue with his knee brace as needed.  Addendum: X-rays of the right knee are unremarkable.  Proceed with MRI scan as scheduled.

## 2020-06-09 ENCOUNTER — Other Ambulatory Visit: Payer: Self-pay | Admitting: Family Medicine

## 2020-06-10 ENCOUNTER — Telehealth: Payer: Self-pay | Admitting: Family Medicine

## 2020-06-10 DIAGNOSIS — I219 Acute myocardial infarction, unspecified: Secondary | ICD-10-CM

## 2020-06-10 HISTORY — DX: Acute myocardial infarction, unspecified: I21.9

## 2020-06-10 MED ORDER — MELOXICAM 15 MG PO TABS: 15.0000 mg | ORAL_TABLET | Freq: Every day | ORAL | 3 refills | Status: DC

## 2020-06-10 MED ORDER — CARVEDILOL 12.5 MG PO TABS: 12.5000 mg | ORAL_TABLET | Freq: Two times a day (BID) | ORAL | 1 refills | Status: DC

## 2020-06-10 NOTE — Telephone Encounter (Signed)
Pt called in asking for refills on the Meloxicam and carvedilo, pt is out of those medications he has an appt scheduled on 06/17/20 with Tabori.   Please advise if ok to refill   Pt uses [facility]

## 2020-06-10 NOTE — Telephone Encounter (Signed)
Medication filled to pharmacy as requested.   

## 2020-06-17 ENCOUNTER — Ambulatory Visit: Payer: BC Managed Care – PPO | Admitting: Family Medicine

## 2020-06-17 ENCOUNTER — Other Ambulatory Visit: Payer: Self-pay

## 2020-06-17 ENCOUNTER — Encounter: Payer: Self-pay | Admitting: Family Medicine

## 2020-06-17 VITALS — BP 121/81 | HR 80 | Temp 97.9°F | Resp 16 | Ht 74.0 in | Wt 234.4 lb

## 2020-06-17 DIAGNOSIS — G459 Transient cerebral ischemic attack, unspecified: Secondary | ICD-10-CM | POA: Diagnosis not present

## 2020-06-17 DIAGNOSIS — E118 Type 2 diabetes mellitus with unspecified complications: Secondary | ICD-10-CM

## 2020-06-17 DIAGNOSIS — E785 Hyperlipidemia, unspecified: Secondary | ICD-10-CM

## 2020-06-17 DIAGNOSIS — I10 Essential (primary) hypertension: Secondary | ICD-10-CM

## 2020-06-17 NOTE — Assessment & Plan Note (Signed)
Chronic problem.  Pt has not followed up as he was supposed to.  Foot exam done today.  On ARB for renal protection.  Due for eye exam.  He is down 20 lbs since last visit in Sept.  Applauded his efforts.  Check labs.  Adjust meds prn

## 2020-06-17 NOTE — Assessment & Plan Note (Signed)
Chronic problem.  Tolerating statin w/o difficulty.  Check labs.  Adjust meds prn  

## 2020-06-17 NOTE — Progress Notes (Signed)
   Subjective:    Patient ID: Jason Floyd, male    DOB: 1966/01/09, 54 y.o.   MRN: [id number]  HPI DM- chronic problem, on Farxiga5mg  daily, Metformin 1000mg  BID.  Pt has not followed up as directed.  On ARB for renal protection.  Due for eye exam and foot exam.  Pt is down 6 lbs in 3 weeks and 20 lbs since last visit.  Denies symptomatic lows, no numbness/tingling of hands/feet.  No sores or blisters on feet.  HTN- chronic problem, on Coreg 12.5mg  BID, Losartan 100mg  daily w/ good control.  No CP, SOB, HAs, visual changes, edema.  Hyperlipidemia- chronic problem, on Lipitor 20mg  daily and Fenofibrated 160mg  daily.  Denies abd pain, N/V.  TIA- sxs occurred 1 week ago.  Was hot and dehydrated when he bent down to pick up 2 cans of paint, and it 'felt like my ears popped and I was in a barrell'.   Paint can in L hand dropped.  He bent over to pick it up again and again dropped.  out loud, 'what the hell' but it 'came out as gibberish'.  He felt smile was normal.  No difficulty walking.  sxs lasted <10 minutes and resolved completely.  Has not had any recurrent sxs.  Risks include sedentary lifestyle, HTN, hyperlipidemia, smoking, DM.  Not currently on ASA.   Review of Systems For ROS see HPI   This visit occurred during the SARS-CoV-2 public health emergency.  Safety protocols were in place, including screening questions prior to the visit, additional usage of staff PPE, and extensive cleaning of exam room while observing appropriate contact time as indicated for disinfecting solutions.       Objective:   Physical Exam Vitals reviewed.  Constitutional:      General: He is not in acute distress.    Appearance: Normal appearance. He is well-developed.  HENT:     Head: Normocephalic and atraumatic.  Eyes:     Conjunctiva/sclera: Conjunctivae normal.     Pupils: Pupils are equal, round, and reactive to light.  Neck:     Thyroid: No thyromegaly.  Cardiovascular:     Rate and  Rhythm: Normal rate and regular rhythm.     Heart sounds: Normal heart sounds. No murmur heard.   Pulmonary:     Effort: Pulmonary effort is normal. No respiratory distress.     Breath sounds: Normal breath sounds.  Abdominal:     General: Bowel sounds are normal. There is no distension.     Palpations: Abdomen is soft.  Musculoskeletal:     Cervical back: Normal range of motion and neck supple.  Lymphadenopathy:     Cervical: No cervical adenopathy.  Skin:    General: Skin is warm and dry.  Neurological:     Mental Status: He is alert and oriented to person, place, and time.     Cranial Nerves: No cranial nerve deficit.  Psychiatric:        Behavior: Behavior normal.           Assessment & Plan:

## 2020-06-17 NOTE — Assessment & Plan Note (Signed)
New.  Pt had sxs 1 week ago that lasted 5-10 minutes and resolved spontaneously.  Discussed that if this was TIA this puts him at higher risk for another event.  Also reviewed that he has multiple risk factors and that if he has sxs again in the future he must call 911 immediately.  Since this occurred 1 week ago, will pursue TIA workup as an outpt- MRI, MRA, carotid dopplers, ECHO.  EKG in office today was compared to the one done in 2016 and there was no significant change.  He is to start 81mg  ASA, stop smoking, and continue to work on healthy diet and regular exercise for risk reduction.  Will follow closely and if any abnormalities found during testing will refer to appropriate specialist.  Pt expressed understanding and is in agreement w/ plan.

## 2020-06-17 NOTE — Patient Instructions (Signed)
Follow up in 3-4 months to recheck diabetes We'll notify you of your lab results and make any changes if needed START 81mg  Aspirin daily (enteric coated) We'll call you with your additional testing- MRI/MRA, carotid dopplers, and ECHO Try and stop smoking! Keep up the good work on healthy diet and regular exercise- you're doing great! Get your COVID vaccine if you haven't already done so If you have similar symptoms- please call 911 Call with any questions or concerns Have a great weekend!

## 2020-06-17 NOTE — Assessment & Plan Note (Signed)
Chronic problem.  Adequate control today.  Check labs.  No anticipated med changes.  Will follow. 

## 2020-06-18 LAB — HEPATIC FUNCTION PANEL
AG Ratio: 1.7 (calc) (ref 1.0–2.5)
ALT: 57 U/L — ABNORMAL HIGH (ref 9–46)
AST: 21 U/L (ref 10–35)
Albumin: 4.5 g/dL (ref 3.6–5.1)
Alkaline phosphatase (APISO): 41 U/L (ref 35–144)
Bilirubin, Direct: 0.1 mg/dL (ref 0.0–0.2)
Globulin: 2.7 g/dL (calc) (ref 1.9–3.7)
Indirect Bilirubin: 0.5 mg/dL (calc) (ref 0.2–1.2)
Total Bilirubin: 0.6 mg/dL (ref 0.2–1.2)
Total Protein: 7.2 g/dL (ref 6.1–8.1)

## 2020-06-18 LAB — LIPID PANEL
Cholesterol: 161 mg/dL (ref <200)
HDL: 44 mg/dL (ref >=40)
LDL Cholesterol (Calc): 89 mg/dL (calc)
Non-HDL Cholesterol (Calc): 117 mg/dL (calc) (ref <130)
Total CHOL/HDL Ratio: 3.7 (calc) (ref <5.0)
Triglycerides: 190 mg/dL — ABNORMAL HIGH (ref <150)

## 2020-06-18 LAB — CBC WITH DIFFERENTIAL/PLATELET
Absolute Monocytes: 821 cells/uL (ref 200–950)
Basophils Absolute: 57 cells/uL (ref 0–200)
Basophils Relative: 0.5 %
Eosinophils Absolute: 342 cells/uL (ref 15–500)
Eosinophils Relative: 3 %
HCT: 47.6 % (ref 38.5–50.0)
Hemoglobin: 16.5 g/dL (ref 13.2–17.1)
Lymphs Abs: 3488 cells/uL (ref 850–3900)
MCH: 32.4 pg (ref 27.0–33.0)
MCHC: 34.7 g/dL (ref 32.0–36.0)
MCV: 93.3 fL (ref 80.0–100.0)
MPV: 11.2 fL (ref 7.5–12.5)
Monocytes Relative: 7.2 %
Neutro Abs: 6692 cells/uL (ref 1500–7800)
Neutrophils Relative %: 58.7 %
Platelets: 253 10*3/uL (ref 140–400)
RBC: 5.1 10*6/uL (ref 4.20–5.80)
RDW: 12.1 % (ref 11.0–15.0)
Total Lymphocyte: 30.6 %
WBC: 11.4 10*3/uL — ABNORMAL HIGH (ref 3.8–10.8)

## 2020-06-18 LAB — BASIC METABOLIC PANEL
BUN: 14 mg/dL (ref 7–25)
CO2: 21 mmol/L (ref 20–32)
Calcium: 10 mg/dL (ref 8.6–10.3)
Chloride: 101 mmol/L (ref 98–110)
Creat: 1.13 mg/dL (ref 0.70–1.33)
Glucose, Bld: 134 mg/dL — ABNORMAL HIGH (ref 65–99)
Potassium: 4.4 mmol/L (ref 3.5–5.3)
Sodium: 138 mmol/L (ref 135–146)

## 2020-06-18 LAB — HEMOGLOBIN A1C
Hgb A1c MFr Bld: 6.9 % of total Hgb — ABNORMAL HIGH (ref <5.7)
Mean Plasma Glucose: 151 (calc)
eAG (mmol/L): 8.4 (calc)

## 2020-06-18 LAB — TSH: TSH: 2.83 mIU/L (ref 0.40–4.50)

## 2020-06-20 ENCOUNTER — Other Ambulatory Visit: Payer: Self-pay | Admitting: Family Medicine

## 2020-06-20 ENCOUNTER — Telehealth: Payer: Self-pay | Admitting: Family Medicine

## 2020-06-20 DIAGNOSIS — D72829 Elevated white blood cell count, unspecified: Secondary | ICD-10-CM

## 2020-06-20 DIAGNOSIS — R7989 Other specified abnormal findings of blood chemistry: Secondary | ICD-10-CM

## 2020-06-20 DIAGNOSIS — R945 Abnormal results of liver function studies: Secondary | ICD-10-CM

## 2020-06-20 NOTE — Telephone Encounter (Signed)
Per Lupita Leash with scheduling the order is incorrect please enter the order as VAS [id number]  # [phone]

## 2020-06-20 NOTE — Telephone Encounter (Signed)
Orders sent to PCP and signed.

## 2020-06-20 NOTE — Addendum Note (Signed)
Addended by: Sheliah Hatch on: 06/20/2020 10:05 AM   Modules accepted: Orders

## 2020-06-22 ENCOUNTER — Ambulatory Visit (HOSPITAL_COMMUNITY): Payer: BC Managed Care – PPO

## 2020-06-22 ENCOUNTER — Ambulatory Visit (HOSPITAL_BASED_OUTPATIENT_CLINIC_OR_DEPARTMENT_OTHER)
Admission: RE | Admit: 2020-06-22 | Discharge: 2020-06-22 | Disposition: A | Discharge status: Home / Self Care | Payer: BC Managed Care – PPO | Source: Ambulatory Visit | Attending: Family Medicine | Admitting: Family Medicine

## 2020-06-22 ENCOUNTER — Other Ambulatory Visit: Payer: Self-pay

## 2020-06-22 ENCOUNTER — Ambulatory Visit (HOSPITAL_COMMUNITY)
Admission: RE | Admit: 2020-06-22 | Discharge: 2020-06-22 | Disposition: A | Discharge status: Home / Self Care | Payer: BC Managed Care – PPO | Source: Ambulatory Visit | Attending: Family Medicine | Admitting: Family Medicine

## 2020-06-22 ENCOUNTER — Ambulatory Visit
Admission: RE | Admit: 2020-06-22 | Discharge: 2020-06-22 | Disposition: A | Discharge status: Home / Self Care | Payer: BC Managed Care – PPO | Source: Ambulatory Visit | Attending: Sports Medicine | Admitting: Sports Medicine

## 2020-06-22 DIAGNOSIS — M25561 Pain in right knee: Secondary | ICD-10-CM

## 2020-06-22 DIAGNOSIS — G459 Transient cerebral ischemic attack, unspecified: Secondary | ICD-10-CM | POA: Insufficient documentation

## 2020-06-22 LAB — ECHOCARDIOGRAM COMPLETE
Calc EF: 37.1 %
S' Lateral: 4.3 cm
Single Plane A2C EF: 29.8 %
Single Plane A4C EF: 44.6 %

## 2020-06-22 IMAGING — MR MR KNEE*R* W/O CM
4 of 6 series · 23 of 40 positions shown · non-contrast
Comparison: None.

CLINICAL DATA: Right knee pain and popping for 5 months. No known
injury.

EXAM:
MRI OF THE RIGHT KNEE WITHOUT CONTRAST
TECHNIQUE: Multiplanar, multisequence MR imaging of the knee was performed. No
intravenous contrast was administered.

[Series 4: T1 · coronal · 4.0mm · 0.29mm/px · 3 of 29 slices shown]
[im 5/29]
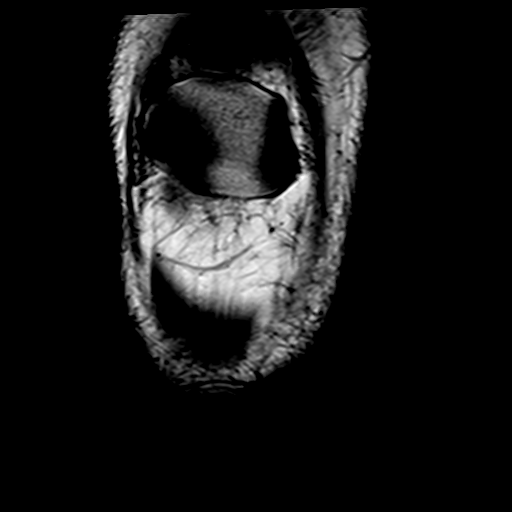
[im 15/29]
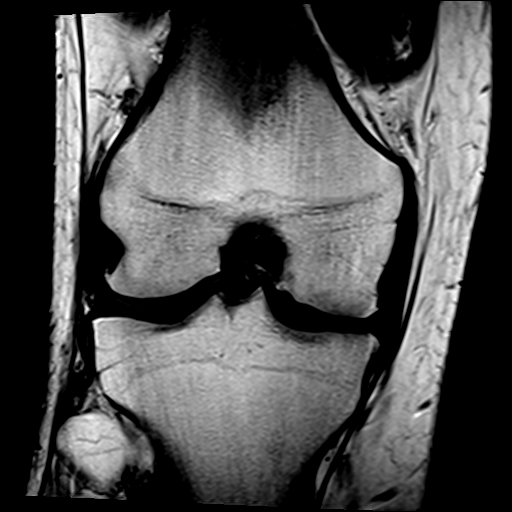
[im 24/29]
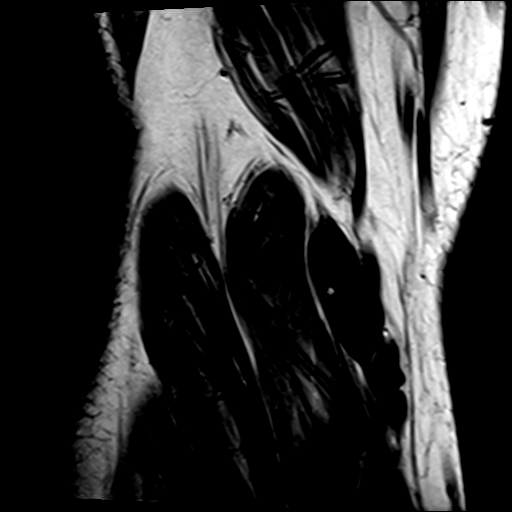

[Series 5: T2 fat-sat · coronal · 4.0mm · 0.59mm/px · 6 of 25 slices shown (1 of 2)]
[im 1/25]
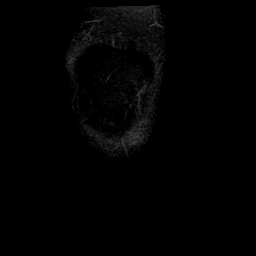
[im 5/25]
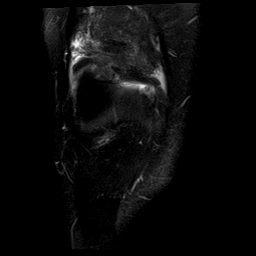
[im 10/25]
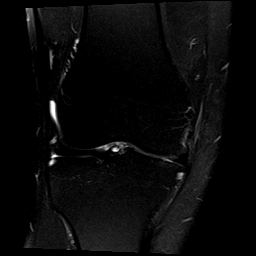
[im 15/25]
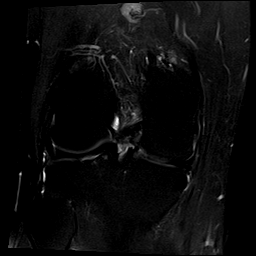
[im 20/25]
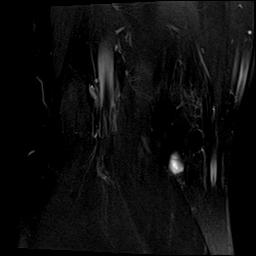
[im 25/25]
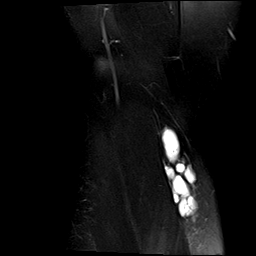

[Series 7: PD fat-sat · sagittal · 3.0mm · 0.29mm/px · 7 of 28 slices shown]
[im 1/28]
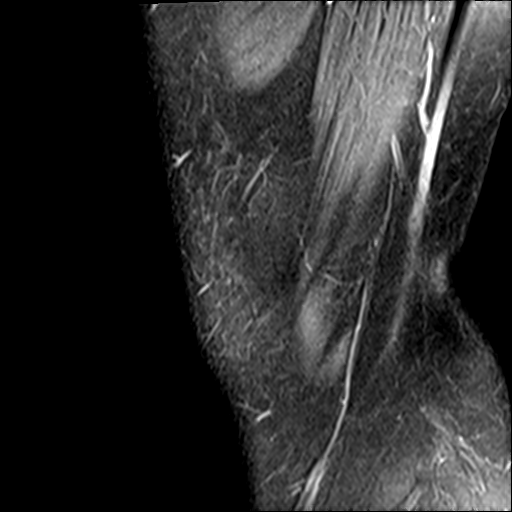
[im 5/28]
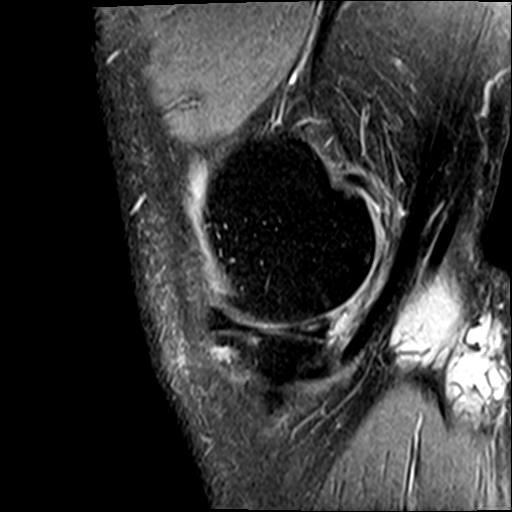
[im 10/28]
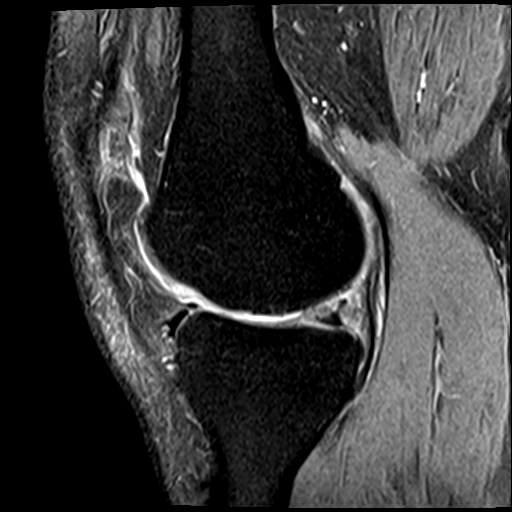
[im 14/28]
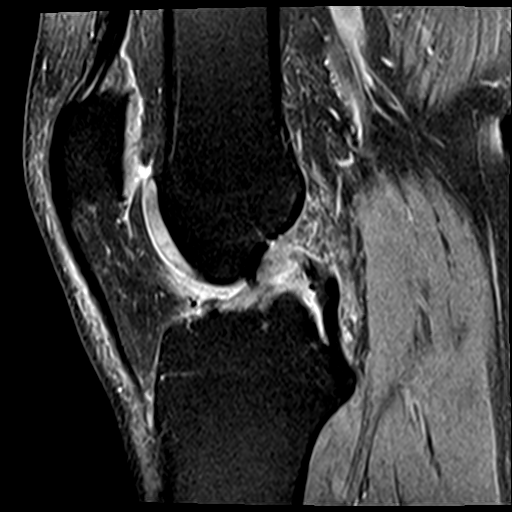
[im 19/28]
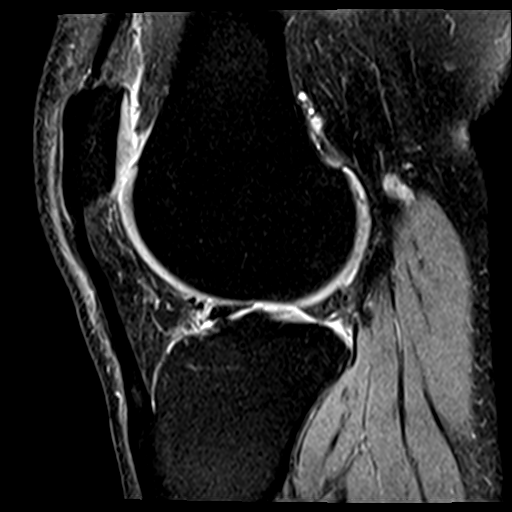
[im 23/28]
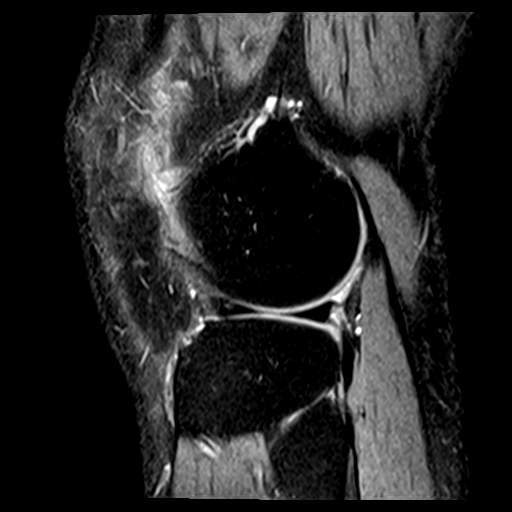
[im 28/28]
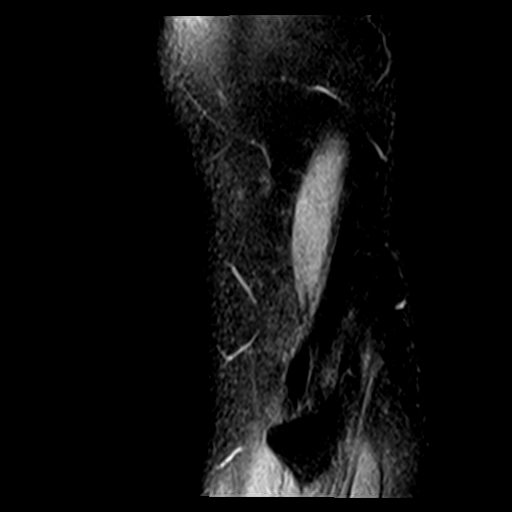

[Series 8: T2 fat-sat · sagittal · 3.0mm · 0.29mm/px · 7 of 28 slices shown (2 of 2)]
[im 1/28]
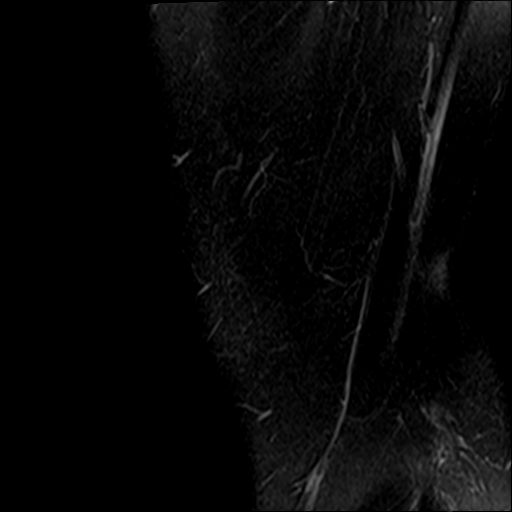
[im 5/28]
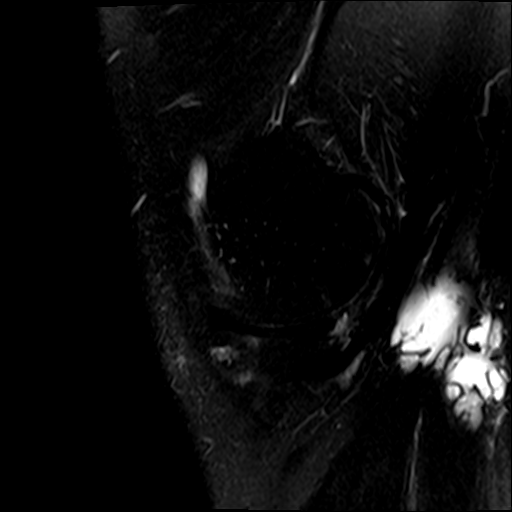
[im 10/28]
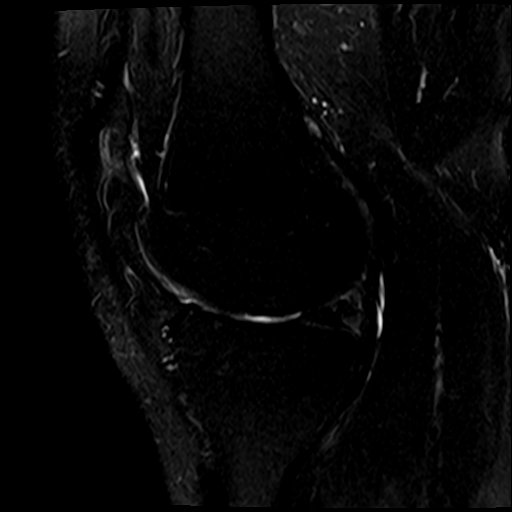
[im 14/28]
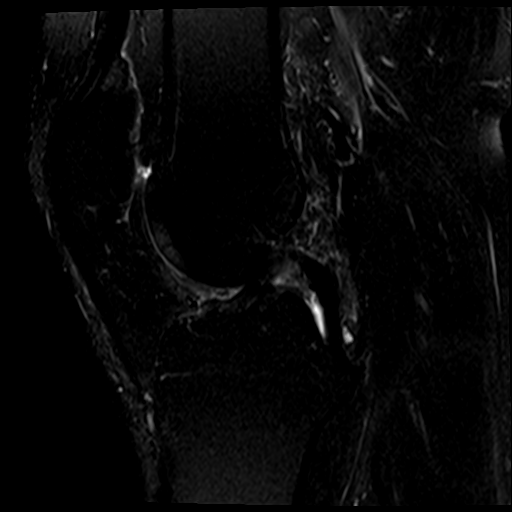
[im 19/28]
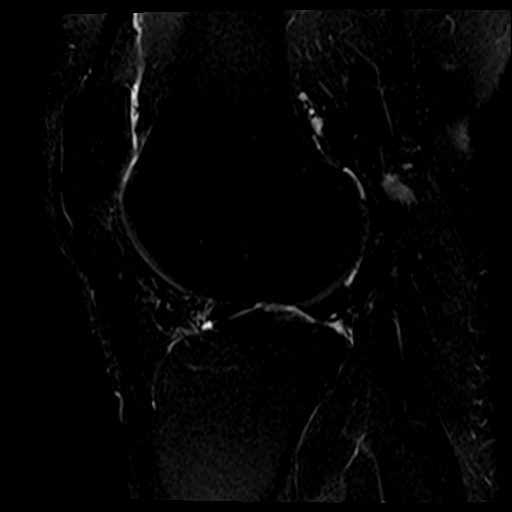
[im 23/28]
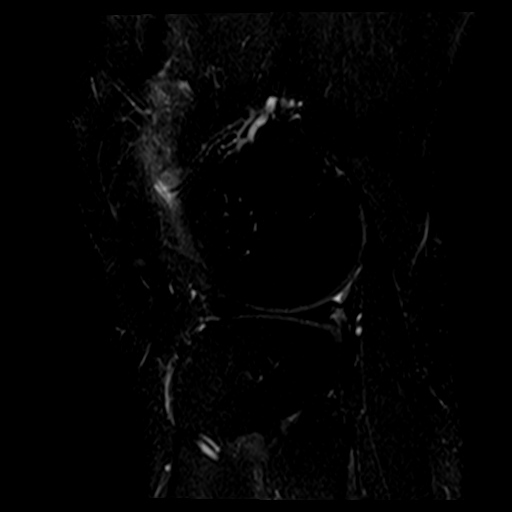
[im 28/28]
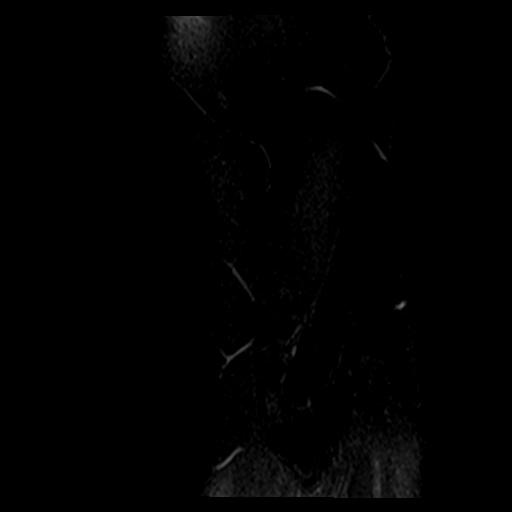

[23 of 40 positions shown; findings below may reference images not displayed]

FINDINGS: MENISCI

Medial meniscus: There is a tear along the superior surface of the
posterior horn reaching the femoral side. The body is severely
degenerated with a complex tear in the midbody which has both
horizontal and longitudinal components.

Lateral meniscus:  Intact.

LIGAMENTS

Cruciates:  Intact.

Collaterals:  Intact

CARTILAGE

Patellofemoral: Fraying and irregularity of hyaline cartilage are
worst along medial facet subjacent to a thickened medial plica.

Medial: Thinned and irregular with associated joint space narrowing.

Lateral:  Mildly frayed and irregular.

Joint:  Very small effusion.

Popliteal Fossa: Septated Baker's cyst measures approximately 2.5 cm
AP x 1.6 cm transverse x 7 cm craniocaudal.

Extensor Mechanism:  Intact.

Bones: No fracture or worrisome lesion. Small osteophytes are seen
about the knee. A very small focus of subchondral cyst formation in
the anterior aspect of the medial tibia. No fracture, stress change
or worrisome lesion.

Other: None.
IMPRESSION: Tearing of the posterior horn and body of the medial meniscus as
described.

Mild-to-moderate osteoarthritis is worst in the medial compartment.

Cartilage loss in the patellofemoral compartment subjacent to a
thickened medial plica suggestive of medial plica syndrome.

Septated Baker's cyst.

## 2020-06-22 NOTE — Progress Notes (Signed)
  Echocardiogram 2D Echocardiogram has been performed.  Jason Floyd 06/22/2020, 2:15 PM

## 2020-06-23 ENCOUNTER — Other Ambulatory Visit: Payer: Self-pay | Admitting: General Practice

## 2020-06-23 ENCOUNTER — Encounter: Payer: Self-pay | Admitting: Family Medicine

## 2020-06-23 DIAGNOSIS — Z8679 Personal history of other diseases of the circulatory system: Secondary | ICD-10-CM

## 2020-06-23 DIAGNOSIS — R9430 Abnormal result of cardiovascular function study, unspecified: Secondary | ICD-10-CM

## 2020-06-27 ENCOUNTER — Telehealth: Payer: Self-pay | Admitting: Sports Medicine

## 2020-06-27 NOTE — Telephone Encounter (Signed)
  I spoke with the patient on the phone today after reviewing MRI findings of his right knee.  He has a complex tear of the medial meniscus in the setting of mild to moderate osteoarthritis.  He also has a septated Baker's cyst.  He is still getting mechanical symptoms but they sound tolerable.  I discussed trying an intra-articular cortisone injection versus referral to orthopedics to discuss arthroscopy.  Patient informed me that he was recently diagnosed with congestive heart failure and has an appointment with cardiology in 2 weeks.  I recommended that he see the cardiologist first and then follow-up with me afterwards.

## 2020-06-30 ENCOUNTER — Encounter (HOSPITAL_COMMUNITY): Payer: Self-pay | Admitting: Internal Medicine

## 2020-06-30 ENCOUNTER — Ambulatory Visit (HOSPITAL_COMMUNITY): Payer: BC Managed Care – PPO

## 2020-06-30 ENCOUNTER — Other Ambulatory Visit (HOSPITAL_COMMUNITY): Payer: Self-pay

## 2020-06-30 ENCOUNTER — Ambulatory Visit (HOSPITAL_COMMUNITY)
Admission: RE | Admit: 2020-06-30 | Discharge: 2020-06-30 | Disposition: A | Discharge status: Home / Self Care | Payer: BC Managed Care – PPO | Source: Ambulatory Visit | Attending: Internal Medicine | Admitting: Internal Medicine

## 2020-06-30 ENCOUNTER — Telehealth (HOSPITAL_COMMUNITY): Payer: Self-pay

## 2020-06-30 ENCOUNTER — Other Ambulatory Visit: Payer: Self-pay

## 2020-06-30 ENCOUNTER — Encounter (HOSPITAL_COMMUNITY): Payer: Self-pay

## 2020-06-30 ENCOUNTER — Telehealth (HOSPITAL_COMMUNITY): Payer: Self-pay | Admitting: *Deleted

## 2020-06-30 ENCOUNTER — Other Ambulatory Visit (HOSPITAL_COMMUNITY)
Admission: RE | Admit: 2020-06-30 | Discharge: 2020-06-30 | Disposition: A | Discharge status: Home / Self Care | Payer: BC Managed Care – PPO | Source: Ambulatory Visit | Attending: Internal Medicine | Admitting: Internal Medicine

## 2020-06-30 VITALS — BP 142/88 | HR 77 | Ht 74.0 in | Wt 239.8 lb

## 2020-06-30 DIAGNOSIS — Z20822 Contact with and (suspected) exposure to covid-19: Secondary | ICD-10-CM | POA: Diagnosis not present

## 2020-06-30 DIAGNOSIS — M109 Gout, unspecified: Secondary | ICD-10-CM | POA: Insufficient documentation

## 2020-06-30 DIAGNOSIS — Z7982 Long term (current) use of aspirin: Secondary | ICD-10-CM | POA: Diagnosis not present

## 2020-06-30 DIAGNOSIS — E119 Type 2 diabetes mellitus without complications: Secondary | ICD-10-CM | POA: Insufficient documentation

## 2020-06-30 DIAGNOSIS — Z79899 Other long term (current) drug therapy: Secondary | ICD-10-CM | POA: Diagnosis not present

## 2020-06-30 DIAGNOSIS — E1129 Type 2 diabetes mellitus with other diabetic kidney complication: Secondary | ICD-10-CM | POA: Diagnosis not present

## 2020-06-30 DIAGNOSIS — G459 Transient cerebral ischemic attack, unspecified: Secondary | ICD-10-CM | POA: Diagnosis not present

## 2020-06-30 DIAGNOSIS — Z7984 Long term (current) use of oral hypoglycemic drugs: Secondary | ICD-10-CM | POA: Diagnosis not present

## 2020-06-30 DIAGNOSIS — I5022 Chronic systolic (congestive) heart failure: Secondary | ICD-10-CM

## 2020-06-30 DIAGNOSIS — E785 Hyperlipidemia, unspecified: Secondary | ICD-10-CM | POA: Diagnosis not present

## 2020-06-30 DIAGNOSIS — M1990 Unspecified osteoarthritis, unspecified site: Secondary | ICD-10-CM | POA: Diagnosis not present

## 2020-06-30 DIAGNOSIS — F17210 Nicotine dependence, cigarettes, uncomplicated: Secondary | ICD-10-CM | POA: Diagnosis not present

## 2020-06-30 DIAGNOSIS — Z86711 Personal history of pulmonary embolism: Secondary | ICD-10-CM | POA: Diagnosis not present

## 2020-06-30 DIAGNOSIS — Z791 Long term (current) use of non-steroidal anti-inflammatories (NSAID): Secondary | ICD-10-CM | POA: Insufficient documentation

## 2020-06-30 LAB — BASIC METABOLIC PANEL
Anion gap: 11 (ref 5–15)
BUN: 16 mg/dL (ref 6–20)
CO2: 19 mmol/L — ABNORMAL LOW (ref 22–32)
Calcium: 9.6 mg/dL (ref 8.9–10.3)
Chloride: 107 mmol/L (ref 98–111)
Creatinine, Ser: 1.1 mg/dL (ref 0.61–1.24)
GFR calc Af Amer: >60 mL/min (ref >60)
GFR calc non Af Amer: >60 mL/min (ref >60)
Glucose, Bld: 149 mg/dL — ABNORMAL HIGH (ref 70–99)
Potassium: 4.2 mmol/L (ref 3.5–5.1)
Sodium: 137 mmol/L (ref 135–145)

## 2020-06-30 LAB — CBC
HCT: 46.5 % (ref 39.0–52.0)
Hemoglobin: 15.3 g/dL (ref 13.0–17.0)
MCH: 30.9 pg (ref 26.0–34.0)
MCHC: 32.9 g/dL (ref 30.0–36.0)
MCV: 93.9 fL (ref 80.0–100.0)
Platelets: 268 10*3/uL (ref 150–400)
RBC: 4.95 MIL/uL (ref 4.22–5.81)
RDW: 12.8 % (ref 11.5–15.5)
WBC: 10.5 10*3/uL (ref 4.0–10.5)
nRBC: 0 % (ref 0.0–0.2)

## 2020-06-30 LAB — SARS CORONAVIRUS 2 (TAT 6-24 HRS): SARS Coronavirus 2: NEGATIVE

## 2020-06-30 MED ORDER — ENTRESTO 97-103 MG PO TABS
1.0000 | ORAL_TABLET | Freq: Two times a day (BID) | ORAL | 6 refills | Status: DC
Start: 2020-06-30 — End: 2020-08-04

## 2020-06-30 NOTE — Progress Notes (Signed)
ReDS Vest / Clip - 06/30/20 1300      ReDS Vest / Clip   Station Marker D    Ruler Value 36    ReDS Value Range High volume overload    ReDS Actual Value 47

## 2020-06-30 NOTE — Patient Instructions (Signed)
Stop Losartan  Start Entresto 97/103 mg Twice daily   [facility] Cath tomorrow, see instructions below  Your physician recommends that you schedule a follow-up appointment in: 1 month with echocardiogram  If you have any questions or concerns before your next appointment please send [place] a message through [place] or call our office at [phone].    TO LEAVE A MESSAGE FOR THE NURSE SELECT OPTION 2, PLEASE LEAVE A MESSAGE INCLUDING: . YOUR NAME . DATE OF BIRTH . CALL BACK NUMBER . REASON FOR CALL**this is important as we prioritize the call backs  YOU WILL RECEIVE A CALL BACK THE SAME DAY AS LONG AS YOU CALL BEFORE 4:00 PM  At the [facility], you and your health needs are our priority. As part of our continuing mission to provide you with exceptional heart care, we have created designated Provider Care Teams. These Care Teams include your primary Cardiologist (physician) and Advanced Practice Providers (APPs- Physician Assistants and Nurse Practitioners) who all work together to provide you with the care you need, when you need it.   You may see any of the following providers on your designated Care Team at your next follow up: Marland Kitchen Dr Arvilla Meres . Dr Marca Ancona . Tonye Becket, NP . Robbie Lis, PA . Karle Plumber, PharmD   Please be sure to bring in all your medications bottles to every appointment.       CATHETERIZATION INSTRUCTIONS:  You are scheduled for a Cardiac Catheterization on Friday, August 20 with Dr. Arvilla Meres.  1. Please arrive at the [facility] (Main Entrance A) at [facility]: [address] [place], [place] [address] at 10:00 AM (This time is two hours before your procedure to ensure your preparation). Free valet parking service is available.   Special note: Every effort is made to have your procedure done on time. Please understand that emergencies sometimes delay scheduled procedures.  2. Diet: Do not eat solid foods  after midnight.  The patient may have clear liquids until 5am upon the day of the procedure.  3. COVID TEST: TODAY AT:   [address], [place]  4. Medication instructions in preparation for your procedure:    FRI 8/20 AM: DO NOT TAKE METFORMIN OR FARXIGA   DO NOT TAKE METFORMIN FRI, SAT, OR SUN, RESTART ON Monday 8/23  On the morning of your procedure, take any morning medicines NOT listed above.  You may use sips of water.  5. Plan for one night stay--bring personal belongings. 6. Bring a current list of your medications and current insurance cards. 7. You MUST have a responsible person to drive you home. 8. Someone MUST be with you the first 24 hours after you arrive home or your discharge will be delayed. 9. Please wear clothes that are easy to get on and off and wear slip-on shoes.  Thank you for allowing [place] to care for you!   -- [facility]

## 2020-06-30 NOTE — Telephone Encounter (Signed)
Per [employer] speciality no pre cert required for R/L heart cath(CPT [id number]) scheduled for 07/01/2020

## 2020-06-30 NOTE — Telephone Encounter (Signed)
Submitted PA request via CMM, case ID [id number]

## 2020-06-30 NOTE — Telephone Encounter (Signed)
Entresto approved through [employer] from:  06/30/20 to 06/30/21

## 2020-06-30 NOTE — Progress Notes (Addendum)
[facility] NEW PATIENT NOTE    HPI:  Jason Floyd is 54 year old with a PMH of severe systolic heart failure due to presumed NICM (no cardiac cath), DM2, former polysubstance abuse, S/P discectomy and spinal fusion 2/-/2014, and former tobacco ( quit 2014). He has had recovery of LV function for several years.   Echo 2008 EF 15% in setting of polysubstance abuse.   ECHO 12/2007 EF 55-60%  Echo 10/20 EF 55-60% no RWMA.   On 06/07/20 had TIA-like symptoms with mild aphasia and weakness (dropped a can with left hand). Went to PCP had carotid u/s and echo (pending brain MRI). Carotid u/s 06/22/20 1-39% bilaterally. Has been taking his heart medicines religiously. For last 18 months has been very sedentary. BP has been well controlled. Rare ETOH. Occasional marijuana. About 5 cigarettes a day.   Echo on 06/22/20 EF 25-30% so scheduled f/u here. Reports increasing SOB over the weekend. Mild CP. No LE edema. + ab bloating.    Past Medical History:  Diagnosis Date  . Allergy   . Arthritis   . CHF (congestive heart failure) (HCC)   . Diabetes mellitus without complication (HCC)   . Drug abuse (HCC)   . Eczema   . Gout   . Hyperlipidemia   . Nephrolithiasis   . PE (pulmonary embolism)     Current Outpatient Medications  Medication Sig Dispense Refill  . allopurinol (ZYLOPRIM) 300 MG tablet TAKE 2 TABLETS BY MOUTH DAILY. 60 tablet 6  . aspirin EC 81 MG tablet Take 81 mg by mouth daily. Swallow whole.    Marland Kitchen atorvastatin (LIPITOR) 20 MG tablet TAKE 1 TABLET BY MOUTH DAILY. 30 tablet 6  . carvedilol (COREG) 12.5 MG tablet Take 1 tablet (12.5 mg total) by mouth 2 (two) times daily with a meal. 180 tablet 1  . cetirizine (ZYRTEC) 10 MG tablet Take 10 mg by mouth daily.    . diclofenac Sodium (VOLTAREN) 1 % GEL Apply topically 4 (four) times daily.    Marland Kitchen FARXIGA 5 MG TABS tablet TAKE 1 TABLET BY MOUTH DAILY 30 tablet 6  . fenofibrate 160 MG tablet TAKE 1 TABLET BY MOUTH DAILY. 90 tablet  1  . losartan (COZAAR) 100 MG tablet TAKE 1 TABLET BY MOUTH DAILY. TAKE IN PLACE OF VALSARTAN DUE TO RECALL. 30 tablet 6  . meloxicam (MOBIC) 15 MG tablet Take 1 tablet (15 mg total) by mouth daily. 30 tablet 3  . metFORMIN (GLUCOPHAGE) 1000 MG tablet TAKE 1 TABLET BY MOUTH 2 TIMES DAILY WITH A MEAL. 180 tablet 0  . methocarbamol (ROBAXIN) 500 MG tablet TAKE 1 TABLET BY MOUTH 3 TIMES DAILY. 45 tablet 0  . pantoprazole (PROTONIX) 40 MG tablet TAKE 1 TABLET BY MOUTH DAILY. 30 tablet 3  . traZODone (DESYREL) 100 MG tablet TAKE 1 TABLET BY MOUTH AT BEDTIME. 30 tablet 6  . triamcinolone (KENALOG) 0.025 % ointment APPLY TO THE AFFECTED AREA 2 TIMES DAILY. 80 g 1  . zolpidem (AMBIEN) 5 MG tablet TAKE 1 TABLET BY MOUTH AT BEDTIME AS NEEDED FOR SLEEP 30 tablet 3   No current facility-administered medications for this encounter.    No Known Allergies    Social History   Socioeconomic History  . Marital status: Married    Spouse name: Not on file  . Number of children: Not on file  . Years of education: Not on file  . Highest education level: Not on file  Occupational History  .  Not on file  Tobacco Use  . Smoking status: Current Some Day Smoker    Packs/day: 0.25    Years: 31.00    Pack years: 7.75    Types: Cigarettes  . Smokeless tobacco: Never Used  . Tobacco comment: pt states that he stopped on 06/26/20  Vaping Use  . Vaping Use: Never used  Substance and Sexual Activity  . Alcohol use: Not Currently    Alcohol/week: 0.0 standard drinks    Comment: occasionally  . Drug use: Not Currently    Types: Marijuana  . Sexual activity: Not on file  Other Topics Concern  . Not on file  Social History Narrative  . Not on file   Social Determinants of Health   Financial Resource Strain:   . Difficulty of Paying Living Expenses: Not on file  Food Insecurity:   . Worried About Programme researcher, broadcasting/film/video in the Last Year: Not on file  . Ran Out of Food in the Last Year: Not on file    Transportation Needs:   . Lack of Transportation (Medical): Not on file  . Lack of Transportation (Non-Medical): Not on file  Physical Activity:   . Days of Exercise per Week: Not on file  . Minutes of Exercise per Session: Not on file  Stress:   . Feeling of Stress : Not on file  Social Connections:   . Frequency of Communication with Friends and Family: Not on file  . Frequency of Social Gatherings with Friends and Family: Not on file  . Attends Religious Services: Not on file  . Active Member of Clubs or Organizations: Not on file  . Attends Banker Meetings: Not on file  . Marital Status: Not on file  Intimate Partner Violence:   . Fear of Current or Ex-Partner: Not on file  . Emotionally Abused: Not on file  . Physically Abused: Not on file  . Sexually Abused: Not on file      Family History  Problem Relation Age of Onset  . Dementia Mother        frontal-temporal  . Breast cancer Maternal Aunt   . Colon cancer Neg Hx     Vitals:   06/30/20 1220  BP: (!) 142/88  Pulse: 77  SpO2: 94%  Weight: 108.8 kg (239 lb 12.8 oz)  Height: 6\' 2"  (1.88 m)    PHYSICAL EXAM: General:  Well appearing. No resp difficulty HEENT: normal Neck: supple. no JVD. Carotids 2+ bilat; no bruits. No lymphadenopathy or thryomegaly appreciated. Cor: PMI nondisplaced. Regular rate & rhythm. No rubs, gallops or murmurs. Lungs: clear Abdomen: soft, nontender, nondistended. No hepatosplenomegaly. No bruits or masses. Good bowel sounds. Extremities: no cyanosis, clubbing, rash, edema Neuro: alert & orientedx3, cranial nerves grossly intact. moves all 4 extremities w/o difficulty. 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 QT prolongation ( ) Personally reviewed   ASSESSMENT & PLAN:  1. Recurrent systolic HF - Unclear why EF has dropped on recent echo after 12 years of being normalized - Will need cardiac cath followed by cMRI if negative' - NYHA II. Volume  status ok - Continue carvedilol 12.5 bid - Switch losartan to Entresto 97/103 bid - Continue Farxiga. - Start spiro soon  2. Tobacco use - counseled on need for cessation  3. DM2 - continue SGLT2i, metformin  - followed by PCP  4. Possible TIA - not sure if this was true TIA or more heat exhaustion. Very transient - carotid  dopplers ok - will await brain MRI/MRA. If appears cardio-embolic will need anticoagulation (no LV thrombus seen on echo)  Arvilla Meres, MD  11:00 PM

## 2020-06-30 NOTE — H&P (View-Only) (Signed)
[facility] NEW PATIENT NOTE    HPI:  Jason Floyd is 54 year old with a PMH of severe systolic heart failure due to presumed NICM (no cardiac cath), DM2, former polysubstance abuse, S/P discectomy and spinal fusion 2/-/2014, and former tobacco ( quit 2014). He has had recovery of LV function for several years.   Echo 2008 EF 15% in setting of polysubstance abuse.   ECHO 12/2007 EF 55-60%  Echo 10/20 EF 55-60% no RWMA.   On 06/07/20 had TIA-like symptoms with mild aphasia and weakness (dropped a can with left hand). Went to PCP had carotid u/s and echo (pending brain MRI). Carotid u/s 06/22/20 1-39% bilaterally. Has been taking his heart medicines religiously. For last 18 months has been very sedentary. BP has been well controlled. Rare ETOH. Occasional marijuana. About 5 cigarettes a day.   Echo on 06/22/20 EF 25-30% so scheduled f/u here. Reports increasing SOB over the weekend. Mild CP. No LE edema. + ab bloating.    Past Medical History:  Diagnosis Date  . Allergy   . Arthritis   . CHF (congestive heart failure) (HCC)   . Diabetes mellitus without complication (HCC)   . Drug abuse (HCC)   . Eczema   . Gout   . Hyperlipidemia   . Nephrolithiasis   . PE (pulmonary embolism)     Current Outpatient Medications  Medication Sig Dispense Refill  . allopurinol (ZYLOPRIM) 300 MG tablet TAKE 2 TABLETS BY MOUTH DAILY. 60 tablet 6  . aspirin EC 81 MG tablet Take 81 mg by mouth daily. Swallow whole.    Marland Kitchen atorvastatin (LIPITOR) 20 MG tablet TAKE 1 TABLET BY MOUTH DAILY. 30 tablet 6  . carvedilol (COREG) 12.5 MG tablet Take 1 tablet (12.5 mg total) by mouth 2 (two) times daily with a meal. 180 tablet 1  . cetirizine (ZYRTEC) 10 MG tablet Take 10 mg by mouth daily.    . diclofenac Sodium (VOLTAREN) 1 % GEL Apply topically 4 (four) times daily.    Marland Kitchen FARXIGA 5 MG TABS tablet TAKE 1 TABLET BY MOUTH DAILY 30 tablet 6  . fenofibrate 160 MG tablet TAKE 1 TABLET BY MOUTH DAILY. 90 tablet  1  . losartan (COZAAR) 100 MG tablet TAKE 1 TABLET BY MOUTH DAILY. TAKE IN PLACE OF VALSARTAN DUE TO RECALL. 30 tablet 6  . meloxicam (MOBIC) 15 MG tablet Take 1 tablet (15 mg total) by mouth daily. 30 tablet 3  . metFORMIN (GLUCOPHAGE) 1000 MG tablet TAKE 1 TABLET BY MOUTH 2 TIMES DAILY WITH A MEAL. 180 tablet 0  . methocarbamol (ROBAXIN) 500 MG tablet TAKE 1 TABLET BY MOUTH 3 TIMES DAILY. 45 tablet 0  . pantoprazole (PROTONIX) 40 MG tablet TAKE 1 TABLET BY MOUTH DAILY. 30 tablet 3  . traZODone (DESYREL) 100 MG tablet TAKE 1 TABLET BY MOUTH AT BEDTIME. 30 tablet 6  . triamcinolone (KENALOG) 0.025 % ointment APPLY TO THE AFFECTED AREA 2 TIMES DAILY. 80 g 1  . zolpidem (AMBIEN) 5 MG tablet TAKE 1 TABLET BY MOUTH AT BEDTIME AS NEEDED FOR SLEEP 30 tablet 3   No current facility-administered medications for this encounter.    No Known Allergies    Social History   Socioeconomic History  . Marital status: Married    Spouse name: Not on file  . Number of children: Not on file  . Years of education: Not on file  . Highest education level: Not on file  Occupational History  .  Not on file  Tobacco Use  . Smoking status: Current Some Day Smoker    Packs/day: 0.25    Years: 31.00    Pack years: 7.75    Types: Cigarettes  . Smokeless tobacco: Never Used  . Tobacco comment: pt states that he stopped on 06/26/20  Vaping Use  . Vaping Use: Never used  Substance and Sexual Activity  . Alcohol use: Not Currently    Alcohol/week: 0.0 standard drinks    Comment: occasionally  . Drug use: Not Currently    Types: Marijuana  . Sexual activity: Not on file  Other Topics Concern  . Not on file  Social History Narrative  . Not on file   Social Determinants of Health   Financial Resource Strain:   . Difficulty of Paying Living Expenses: Not on file  Food Insecurity:   . Worried About Running Out of Food in the Last Year: Not on file  . Ran Out of Food in the Last Year: Not on file    Transportation Needs:   . Lack of Transportation (Medical): Not on file  . Lack of Transportation (Non-Medical): Not on file  Physical Activity:   . Days of Exercise per Week: Not on file  . Minutes of Exercise per Session: Not on file  Stress:   . Feeling of Stress : Not on file  Social Connections:   . Frequency of Communication with Friends and Family: Not on file  . Frequency of Social Gatherings with Friends and Family: Not on file  . Attends Religious Services: Not on file  . Active Member of Clubs or Organizations: Not on file  . Attends Club or Organization Meetings: Not on file  . Marital Status: Not on file  Intimate Partner Violence:   . Fear of Current or Ex-Partner: Not on file  . Emotionally Abused: Not on file  . Physically Abused: Not on file  . Sexually Abused: Not on file      Family History  Problem Relation Age of Onset  . Dementia Mother        frontal-temporal  . Breast cancer Maternal Aunt   . Colon cancer Neg Hx     Vitals:   06/30/20 1220  BP: (!) 142/88  Pulse: 77  SpO2: 94%  Weight: 108.8 kg (239 lb 12.8 oz)  Height: 6' 2" (1.88 m)    PHYSICAL EXAM: General:  Well appearing. No resp difficulty HEENT: normal Neck: supple. no JVD. Carotids 2+ bilat; no bruits. No lymphadenopathy or thryomegaly appreciated. Cor: PMI nondisplaced. Regular rate & rhythm. No rubs, gallops or murmurs. Lungs: clear Abdomen: soft, nontender, nondistended. No hepatosplenomegaly. No bruits or masses. Good bowel sounds. Extremities: no cyanosis, clubbing, rash, edema Neuro: alert & orientedx3, cranial nerves grossly intact. moves all 4 extremities w/o difficulty. Affect pleasant   ECG: NSR 72 with 1AVB (204ms) LAFB. QT prolongation (508ms) Personally reviewed   ASSESSMENT & PLAN:  1. Recurrent systolic HF - Unclear why EF has dropped on recent echo after 12 years of being normalized - Will need cardiac cath followed by cMRI if negative' - NYHA II. Volume  status ok - Continue carvedilol 12.5 bid - Switch losartan to Entresto 97/103 bid - Continue Farxiga. - Start spiro soon  2. Tobacco use - counseled on need for cessation  3. DM2 - continue SGLT2i, metformin  - followed by PCP  4. Possible TIA - not sure if this was true TIA or more heat exhaustion. Very transient - carotid   dopplers ok - will await brain MRI/MRA. If appears cardio-embolic will need anticoagulation (no LV thrombus seen on echo)  Arvilla Meres, MD  11:00 PM

## 2020-07-01 ENCOUNTER — Ambulatory Visit: Payer: BC Managed Care – PPO

## 2020-07-01 ENCOUNTER — Ambulatory Visit (HOSPITAL_COMMUNITY)
Admission: RE | Admit: 2020-07-01 | Discharge: 2020-07-01 | Disposition: A | Discharge status: Home / Self Care | Payer: BC Managed Care – PPO | Attending: Internal Medicine | Admitting: Internal Medicine

## 2020-07-01 ENCOUNTER — Other Ambulatory Visit (HOSPITAL_COMMUNITY): Payer: Self-pay

## 2020-07-01 ENCOUNTER — Ambulatory Visit (HOSPITAL_COMMUNITY)
Admission: RE | Disposition: A | Discharge status: Home / Self Care | Payer: Self-pay | Source: Home / Self Care | Attending: Internal Medicine

## 2020-07-01 ENCOUNTER — Telehealth (HOSPITAL_COMMUNITY): Payer: Self-pay | Admitting: *Deleted

## 2020-07-01 DIAGNOSIS — F17210 Nicotine dependence, cigarettes, uncomplicated: Secondary | ICD-10-CM | POA: Diagnosis not present

## 2020-07-01 DIAGNOSIS — I2510 Atherosclerotic heart disease of native coronary artery without angina pectoris: Secondary | ICD-10-CM | POA: Diagnosis not present

## 2020-07-01 DIAGNOSIS — M109 Gout, unspecified: Secondary | ICD-10-CM | POA: Insufficient documentation

## 2020-07-01 DIAGNOSIS — E785 Hyperlipidemia, unspecified: Secondary | ICD-10-CM | POA: Insufficient documentation

## 2020-07-01 DIAGNOSIS — Z7982 Long term (current) use of aspirin: Secondary | ICD-10-CM | POA: Diagnosis not present

## 2020-07-01 DIAGNOSIS — M1990 Unspecified osteoarthritis, unspecified site: Secondary | ICD-10-CM | POA: Insufficient documentation

## 2020-07-01 DIAGNOSIS — I255 Ischemic cardiomyopathy: Secondary | ICD-10-CM | POA: Diagnosis not present

## 2020-07-01 DIAGNOSIS — I5023 Acute on chronic systolic (congestive) heart failure: Secondary | ICD-10-CM | POA: Diagnosis present

## 2020-07-01 DIAGNOSIS — Z791 Long term (current) use of non-steroidal anti-inflammatories (NSAID): Secondary | ICD-10-CM | POA: Insufficient documentation

## 2020-07-01 DIAGNOSIS — Z7984 Long term (current) use of oral hypoglycemic drugs: Secondary | ICD-10-CM | POA: Insufficient documentation

## 2020-07-01 DIAGNOSIS — I5021 Acute systolic (congestive) heart failure: Secondary | ICD-10-CM | POA: Diagnosis not present

## 2020-07-01 DIAGNOSIS — I2582 Chronic total occlusion of coronary artery: Secondary | ICD-10-CM | POA: Insufficient documentation

## 2020-07-01 DIAGNOSIS — Z79899 Other long term (current) drug therapy: Secondary | ICD-10-CM | POA: Insufficient documentation

## 2020-07-01 DIAGNOSIS — I5022 Chronic systolic (congestive) heart failure: Secondary | ICD-10-CM

## 2020-07-01 DIAGNOSIS — E119 Type 2 diabetes mellitus without complications: Secondary | ICD-10-CM | POA: Diagnosis not present

## 2020-07-01 DIAGNOSIS — I25110 Atherosclerotic heart disease of native coronary artery with unstable angina pectoris: Secondary | ICD-10-CM

## 2020-07-01 DIAGNOSIS — Z86711 Personal history of pulmonary embolism: Secondary | ICD-10-CM | POA: Diagnosis not present

## 2020-07-01 HISTORY — PX: RIGHT/LEFT HEART CATH AND CORONARY ANGIOGRAPHY: CATH118266

## 2020-07-01 LAB — POCT I-STAT EG7
Acid-base deficit: 2 mmol/L (ref 0.0–2.0)
Acid-base deficit: 2 mmol/L (ref 0.0–2.0)
Acid-base deficit: 3 mmol/L — ABNORMAL HIGH (ref 0.0–2.0)
Bicarbonate: 21.8 mmol/L (ref 20.0–28.0)
Bicarbonate: 22.3 mmol/L (ref 20.0–28.0)
Bicarbonate: 23.2 mmol/L (ref 20.0–28.0)
Calcium, Ion: 0.92 mmol/L — ABNORMAL LOW (ref 1.15–1.40)
Calcium, Ion: 0.92 mmol/L — ABNORMAL LOW (ref 1.15–1.40)
Calcium, Ion: 1.14 mmol/L — ABNORMAL LOW (ref 1.15–1.40)
HCT: 35 % — ABNORMAL LOW (ref 39.0–52.0)
HCT: 36 % — ABNORMAL LOW (ref 39.0–52.0)
HCT: 40 % (ref 39.0–52.0)
Hemoglobin: 11.9 g/dL — ABNORMAL LOW (ref 13.0–17.0)
Hemoglobin: 12.2 g/dL — ABNORMAL LOW (ref 13.0–17.0)
Hemoglobin: 13.6 g/dL (ref 13.0–17.0)
O2 Saturation: 70 %
O2 Saturation: 76 %
O2 Saturation: 78 %
Potassium: 3.2 mmol/L — ABNORMAL LOW (ref 3.5–5.1)
Potassium: 3.2 mmol/L — ABNORMAL LOW (ref 3.5–5.1)
Potassium: 3.9 mmol/L (ref 3.5–5.1)
Sodium: 143 mmol/L (ref 135–145)
Sodium: 148 mmol/L — ABNORMAL HIGH (ref 135–145)
Sodium: 148 mmol/L — ABNORMAL HIGH (ref 135–145)
TCO2: 23 mmol/L (ref 22–32)
TCO2: 23 mmol/L (ref 22–32)
TCO2: 24 mmol/L (ref 22–32)
pCO2, Ven: 36.8 mmHg — ABNORMAL LOW (ref 44.0–60.0)
pCO2, Ven: 37.7 mmHg — ABNORMAL LOW (ref 44.0–60.0)
pCO2, Ven: 38.9 mmHg — ABNORMAL LOW (ref 44.0–60.0)
pH, Ven: 7.37 (ref 7.250–7.430)
pH, Ven: 7.384 (ref 7.250–7.430)
pH, Ven: 7.39 (ref 7.250–7.430)
pO2, Ven: 37 mmHg (ref 32.0–45.0)
pO2, Ven: 42 mmHg (ref 32.0–45.0)
pO2, Ven: 43 mmHg (ref 32.0–45.0)

## 2020-07-01 LAB — POCT I-STAT 7, (LYTES, BLD GAS, ICA,H+H)
Acid-base deficit: 4 mmol/L — ABNORMAL HIGH (ref 0.0–2.0)
Bicarbonate: 20.2 mmol/L (ref 20.0–28.0)
Calcium, Ion: 0.99 mmol/L — ABNORMAL LOW (ref 1.15–1.40)
HCT: 38 % — ABNORMAL LOW (ref 39.0–52.0)
Hemoglobin: 12.9 g/dL — ABNORMAL LOW (ref 13.0–17.0)
O2 Saturation: 96 %
Potassium: 3.5 mmol/L (ref 3.5–5.1)
Sodium: 146 mmol/L — ABNORMAL HIGH (ref 135–145)
TCO2: 21 mmol/L — ABNORMAL LOW (ref 22–32)
pCO2 arterial: 32 mmHg (ref 32.0–48.0)
pH, Arterial: 7.409 (ref 7.350–7.450)
pO2, Arterial: 80 mmHg — ABNORMAL LOW (ref 83.0–108.0)

## 2020-07-01 LAB — GLUCOSE, CAPILLARY: Glucose-Capillary: 161 mg/dL — ABNORMAL HIGH (ref 70–99)

## 2020-07-01 SURGERY — RIGHT/LEFT HEART CATH AND CORONARY ANGIOGRAPHY
Anesthesia: LOCAL

## 2020-07-01 MED ORDER — MIDAZOLAM HCL 2 MG/2ML IJ SOLN
INTRAMUSCULAR | Status: AC
  Filled 2020-07-01: qty 2

## 2020-07-01 MED ORDER — HEPARIN (PORCINE) IN NACL 1000-0.9 UT/500ML-% IV SOLN
INTRAVENOUS | Status: DC | PRN
  Administered 2020-07-01 (×2): 500 mL

## 2020-07-01 MED ORDER — MIDAZOLAM HCL 2 MG/2ML IJ SOLN
INTRAMUSCULAR | Status: DC | PRN
  Administered 2020-07-01: 2 mg via INTRAVENOUS

## 2020-07-01 MED ORDER — SODIUM CHLORIDE 0.9% FLUSH: 3.0000 mL | INTRAVENOUS | Status: DC | PRN

## 2020-07-01 MED ORDER — VERAPAMIL HCL 2.5 MG/ML IV SOLN
INTRAVENOUS | Status: DC | PRN
  Administered 2020-07-01: 10 mL via INTRA_ARTERIAL

## 2020-07-01 MED ORDER — VERAPAMIL HCL 2.5 MG/ML IV SOLN
INTRAVENOUS | Status: AC
  Filled 2020-07-01: qty 2

## 2020-07-01 MED ORDER — LIDOCAINE HCL (PF) 1 % IJ SOLN
INTRAMUSCULAR | Status: AC
  Filled 2020-07-01: qty 30

## 2020-07-01 MED ORDER — HEPARIN (PORCINE) IN NACL 1000-0.9 UT/500ML-% IV SOLN
INTRAVENOUS | Status: AC
  Filled 2020-07-01: qty 1000

## 2020-07-01 MED ORDER — IOHEXOL 350 MG/ML SOLN
INTRAVENOUS | Status: AC
  Filled 2020-07-01: qty 1

## 2020-07-01 MED ORDER — LIDOCAINE HCL (PF) 1 % IJ SOLN
INTRAMUSCULAR | Status: DC | PRN
  Administered 2020-07-01 (×2): 2 mL via INTRADERMAL

## 2020-07-01 MED ORDER — HEPARIN SODIUM (PORCINE) 1000 UNIT/ML IJ SOLN
INTRAMUSCULAR | Status: DC | PRN
  Administered 2020-07-01: 5000 [IU] via INTRAVENOUS

## 2020-07-01 MED ORDER — ASPIRIN 81 MG PO CHEW: 81.0000 mg | CHEWABLE_TABLET | ORAL | Status: DC

## 2020-07-01 MED ORDER — SODIUM CHLORIDE 0.9 % IV SOLN: INTRAVENOUS | Status: DC

## 2020-07-01 MED ORDER — FENTANYL CITRATE (PF) 100 MCG/2ML IJ SOLN
INTRAMUSCULAR | Status: DC | PRN
Start: 2020-07-01 — End: 2020-07-01
  Administered 2020-07-01: 25 ug via INTRAVENOUS

## 2020-07-01 MED ORDER — HEPARIN SODIUM (PORCINE) 1000 UNIT/ML IJ SOLN
INTRAMUSCULAR | Status: AC
  Filled 2020-07-01: qty 1

## 2020-07-01 MED ORDER — FENTANYL CITRATE (PF) 100 MCG/2ML IJ SOLN
INTRAMUSCULAR | Status: AC
  Filled 2020-07-01: qty 2

## 2020-07-01 MED ORDER — SODIUM CHLORIDE 0.9 % IV SOLN: 250.0000 mL | INTRAVENOUS | Status: DC | PRN

## 2020-07-01 MED ORDER — SODIUM CHLORIDE 0.9% FLUSH: 3.0000 mL | Freq: Two times a day (BID) | INTRAVENOUS | Status: DC

## 2020-07-01 MED ORDER — IOHEXOL 350 MG/ML SOLN
INTRAVENOUS | Status: DC | PRN
  Administered 2020-07-01: 50 mL

## 2020-07-01 SURGICAL SUPPLY — 12 items
CATH BALLN WEDGE 5F 110CM (CATHETERS) ×1 IMPLANT
CATH INFINITI 5 FR JL3.5 (CATHETERS) ×1 IMPLANT
CATH INFINITI JR4 5F (CATHETERS) ×1 IMPLANT
DEVICE RAD COMP TR BAND LRG (VASCULAR PRODUCTS) ×2 IMPLANT
GLIDESHEATH SLEND SS 6F .021 (SHEATH) ×1 IMPLANT
GUIDEWIRE INQWIRE 1.5J.035X260 (WIRE) IMPLANT
INQWIRE 1.5J .035X260CM (WIRE) ×2
KIT HEART LEFT (KITS) ×2 IMPLANT
PACK CARDIAC CATHETERIZATION (CUSTOM PROCEDURE TRAY) ×2 IMPLANT
SHEATH GLIDE SLENDER 4/5FR (SHEATH) ×1 IMPLANT
TRANSDUCER W/STOPCOCK (MISCELLANEOUS) ×2 IMPLANT
TUBING CIL FLEX 10 FLL-RA (TUBING) ×2 IMPLANT

## 2020-07-01 NOTE — Telephone Encounter (Signed)
Per Dr [name], pt had occluded LAD on his cath today and needs cMRI ASAP for viability, order placed, will check on pre-cert and scheduled.

## 2020-07-01 NOTE — Interval H&P Note (Signed)
History and Physical Interval Note:  07/01/2020 1:13 PM  Jason Floyd  has presented today for surgery, with the diagnosis of congestive heart failure.  The various methods of treatment have been discussed with the patient and family. After consideration of risks, benefits and other options for treatment, the patient has consented to  Procedure(s): RIGHT/LEFT HEART CATH AND CORONARY ANGIOGRAPHY (N/A) and possible coronary angioplasty as a surgical intervention.  The patient's history has been reviewed, patient examined, no change in status, stable for surgery.  I have reviewed the patient's chart and labs.  Questions were answered to the patient's satisfaction.     [name]

## 2020-07-01 NOTE — Progress Notes (Signed)
Discharge instructions reviewed with patient and family. Verbalized understnading. 

## 2020-07-01 NOTE — Progress Notes (Signed)
Orders Placed This Encounter  Procedures  . ECHOCARDIOGRAM COMPLETE    Standing Status:   Future    Standing Expiration Date:   07/01/2021    Order Specific Question:   Where should this test be performed    Answer:   [facility]    Order Specific Question:   Perflutren DEFINITY (image enhancing agent) should be administered unless hypersensitivity or allergy exist    Answer:   Administer Perflutren    Order Specific Question:   Reason for exam-Echo    Answer:   Congestive Heart Failure  428.0 / I50.9    Order Specific Question:   Release to patient    Answer:   Immediate

## 2020-07-01 NOTE — Discharge Instructions (Signed)
Radial Site Care  This sheet gives you information about how to care for yourself after your procedure. Your health care provider may also give you more specific instructions. If you have problems or questions, contact your health care provider. What can I expect after the procedure? After the procedure, it is common to have:  Bruising and tenderness at the catheter insertion area. Follow these instructions at home: Medicines  Take over-the-counter and prescription medicines only as told by your health care provider. Insertion site care  Follow instructions from your health care provider about how to take care of your insertion site. Make sure you: ? Wash your hands with soap and water before you change your bandage (dressing). If soap and water are not available, use hand sanitizer. ? Change your dressing as told by your health care provider. ? Leave stitches (sutures), skin glue, or adhesive strips in place. These skin closures may need to stay in place for 2 weeks or longer. If adhesive strip edges start to loosen and curl up, you may trim the loose edges. Do not remove adhesive strips completely unless your health care provider tells you to do that.  Check your insertion site every day for signs of infection. Check for: ? Redness, swelling, or pain. ? Fluid or blood. ? Pus or a bad smell. ? Warmth.  Do not take baths, swim, or use a hot tub until your health care provider approves.  You may shower 24-48 hours after the procedure, or as directed by your health care provider. ? Remove the dressing and gently wash the site with plain soap and water. ? Pat the area dry with a clean towel. ? Do not rub the site. That could cause bleeding.  Do not apply powder or lotion to the site. Activity   For 24 hours after the procedure, or as directed by your health care provider: ? Do not flex or bend the affected arm. ? Do not push or pull heavy objects with the affected arm. ? Do not  drive yourself home from the hospital or clinic. You may drive 24 hours after the procedure unless your health care provider tells you not to. ? Do not operate machinery or power tools.  Do not lift anything that is heavier than 10 lb (4.5 kg), or the limit that you are told, until your health care provider says that it is safe.  Ask your health care provider when it is okay to: ? Return to work or school. ? Resume usual physical activities or sports. ? Resume sexual activity. General instructions  If the catheter site starts to bleed, raise your arm and put firm pressure on the site. If the bleeding does not stop, get help right away. This is a medical emergency.  If you went home on the same day as your procedure, a responsible adult should be with you for the first 24 hours after you arrive home.  Keep all follow-up visits as told by your health care provider. This is important. Contact a health care provider if:  You have a fever.  You have redness, swelling, or yellow drainage around your insertion site. Get help right away if:  You have unusual pain at the radial site.  The catheter insertion area swells very fast.  The insertion area is bleeding, and the bleeding does not stop when you hold steady pressure on the area.  Your arm or hand becomes pale, cool, tingly, or numb. These symptoms may represent a serious problem   that is an emergency. Do not wait to see if the symptoms will go away. Get medical help right away. Call your local emergency services (911 in the [place] Do not drive yourself to the hospital. Summary  After the procedure, it is common to have bruising and tenderness at the site.  Follow instructions from your health care provider about how to take care of your radial site wound. Check the wound every day for signs of infection.  Do not lift anything that is heavier than 10 lb (4.5 kg), or the limit that you are told, until your health care provider says  that it is safe. This information is not intended to replace advice given to you by your health care provider. Make sure you discuss any questions you have with your health care provider. Document Revised: 12/04/2017 Document Reviewed: 12/04/2017 Elsevier Patient Education  2020 [employer]  

## 2020-07-04 ENCOUNTER — Other Ambulatory Visit: Payer: Self-pay | Admitting: Family Medicine

## 2020-07-04 ENCOUNTER — Encounter (HOSPITAL_COMMUNITY): Payer: Self-pay | Admitting: Internal Medicine

## 2020-07-04 MED FILL — Heparin Sod (Porcine)-NaCl IV Soln 1000 Unit/500ML-0.9%: INTRAVENOUS | Qty: 500 | Status: AC

## 2020-07-04 MED FILL — Lidocaine HCl Local Preservative Free (PF) Inj 1%: INTRAMUSCULAR | Qty: 30 | Status: AC

## 2020-07-05 ENCOUNTER — Other Ambulatory Visit: Payer: Self-pay

## 2020-07-05 ENCOUNTER — Ambulatory Visit (INDEPENDENT_AMBULATORY_CARE_PROVIDER_SITE_OTHER): Payer: BC Managed Care – PPO

## 2020-07-05 DIAGNOSIS — D72829 Elevated white blood cell count, unspecified: Secondary | ICD-10-CM

## 2020-07-05 DIAGNOSIS — R7989 Other specified abnormal findings of blood chemistry: Secondary | ICD-10-CM

## 2020-07-05 DIAGNOSIS — R945 Abnormal results of liver function studies: Secondary | ICD-10-CM

## 2020-07-06 ENCOUNTER — Encounter: Payer: Self-pay | Admitting: General Practice

## 2020-07-06 LAB — CBC WITH DIFFERENTIAL/PLATELET
Basophils Absolute: 0.1 10*3/uL (ref 0.0–0.1)
Basophils Relative: 0.8 % (ref 0.0–3.0)
Eosinophils Absolute: 0.3 10*3/uL (ref 0.0–0.7)
Eosinophils Relative: 3.1 % (ref 0.0–5.0)
HCT: 49.3 % (ref 39.0–52.0)
Hemoglobin: 16.8 g/dL (ref 13.0–17.0)
Lymphocytes Relative: 28.6 % (ref 12.0–46.0)
Lymphs Abs: 2.9 10*3/uL (ref 0.7–4.0)
MCHC: 34.1 g/dL (ref 30.0–36.0)
MCV: 94.1 fl (ref 78.0–100.0)
Monocytes Absolute: 0.8 10*3/uL (ref 0.1–1.0)
Monocytes Relative: 8.3 % (ref 3.0–12.0)
Neutro Abs: 6 10*3/uL (ref 1.4–7.7)
Neutrophils Relative %: 59.2 % (ref 43.0–77.0)
Platelets: 309 10*3/uL (ref 150.0–400.0)
RBC: 5.24 Mil/uL (ref 4.22–5.81)
RDW: 13.5 % (ref 11.5–15.5)
WBC: 10.1 10*3/uL (ref 4.0–10.5)

## 2020-07-06 LAB — HEPATIC FUNCTION PANEL
ALT: 24 U/L (ref 0–53)
AST: 17 U/L (ref 0–37)
Albumin: 4.6 g/dL (ref 3.5–5.2)
Alkaline Phosphatase: 41 U/L (ref 39–117)
Bilirubin, Direct: 0.1 mg/dL (ref 0.0–0.3)
Total Bilirubin: 0.6 mg/dL (ref 0.2–1.2)
Total Protein: 7.3 g/dL (ref 6.0–8.3)

## 2020-07-07 ENCOUNTER — Encounter (HOSPITAL_COMMUNITY): Payer: Self-pay

## 2020-07-11 ENCOUNTER — Encounter (HOSPITAL_COMMUNITY): Payer: BC Managed Care – PPO | Admitting: Internal Medicine

## 2020-07-26 ENCOUNTER — Other Ambulatory Visit: Payer: Self-pay

## 2020-07-26 ENCOUNTER — Ambulatory Visit
Admission: RE | Admit: 2020-07-26 | Discharge: 2020-07-26 | Disposition: A | Discharge status: Home / Self Care | Payer: BC Managed Care – PPO | Source: Ambulatory Visit | Attending: Family Medicine | Admitting: Family Medicine

## 2020-07-26 ENCOUNTER — Encounter: Payer: Self-pay | Admitting: Family Medicine

## 2020-07-26 DIAGNOSIS — G459 Transient cerebral ischemic attack, unspecified: Secondary | ICD-10-CM

## 2020-07-26 LAB — HM DIABETES EYE EXAM

## 2020-07-26 IMAGING — MR MR MRA HEAD W/O CM
1 series · 10 of 48 positions shown · IV contrast (multihance)
Comparison: None.
COMPARISON: None.

Addendum:
CLINICAL DATA: Transient ischemic attack.

EXAM:
MRI HEAD WITHOUT AND WITH CONTRAST
MRA HEAD WITHOUT CONTRAST
TECHNIQUE: Multiplanar, multiecho pulse sequences of the brain and surrounding
structures were obtained without and with intravenous contrast.
Angiographic images of the head were obtained using MRA technique
without contrast.
CONTRAST:  20mL MULTIHANCE GADOBENATE DIMEGLUMINE 529 MG/ML IV SOLN

[Series 4: [id number] · axial · 0.6mm · 0.26mm/px · z∈[-52,+18]mm · 10 of 149 slices shown]
[im 10/149]
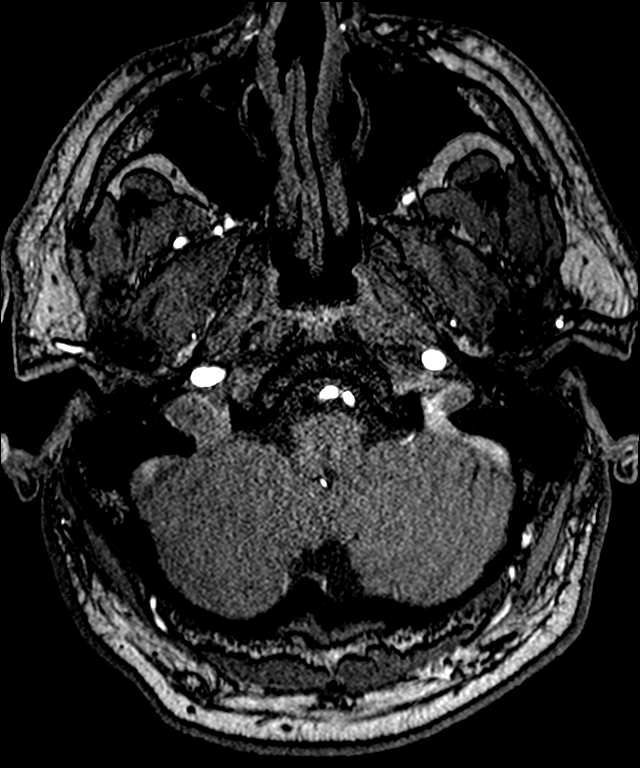
[im 26/149]
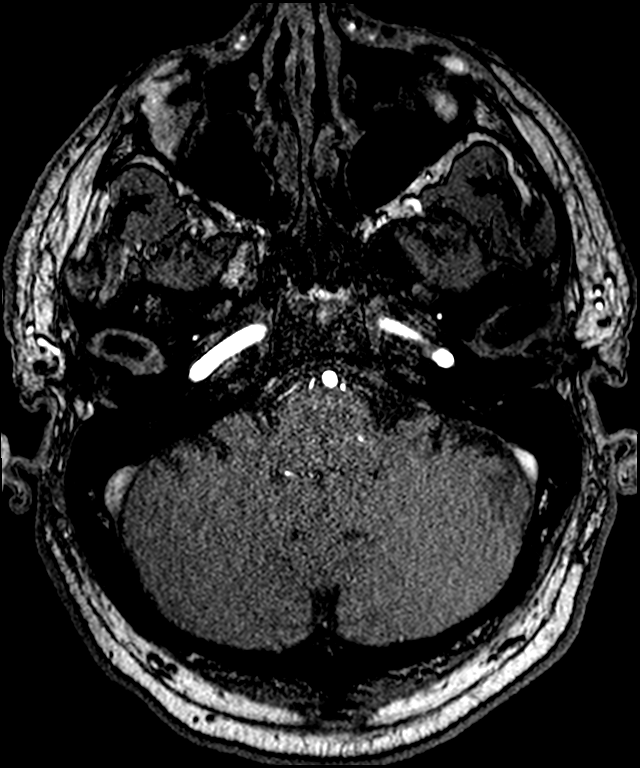
[im 29/149]
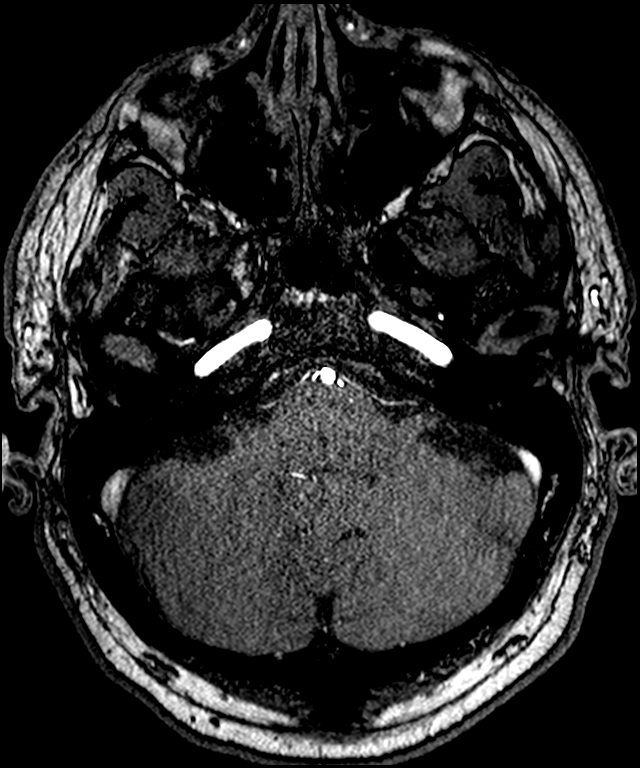
[im 48/149]
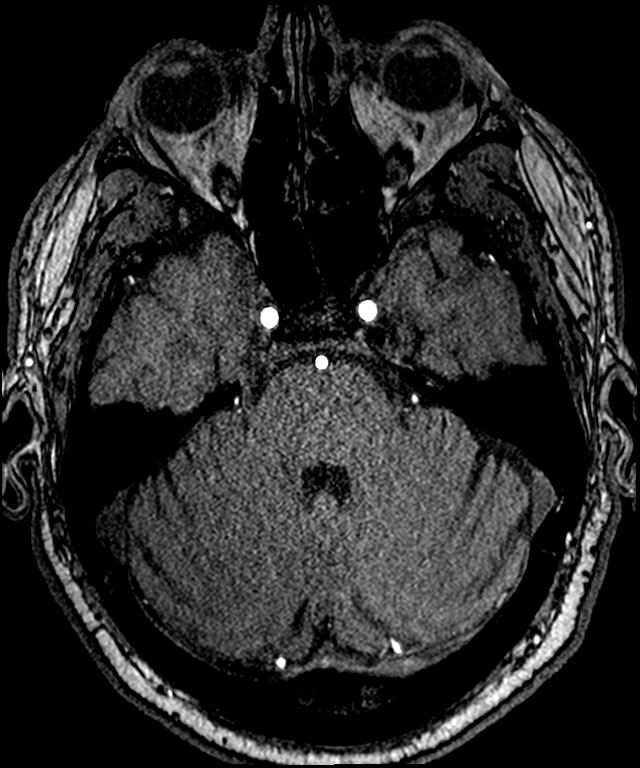
[im 67/149]
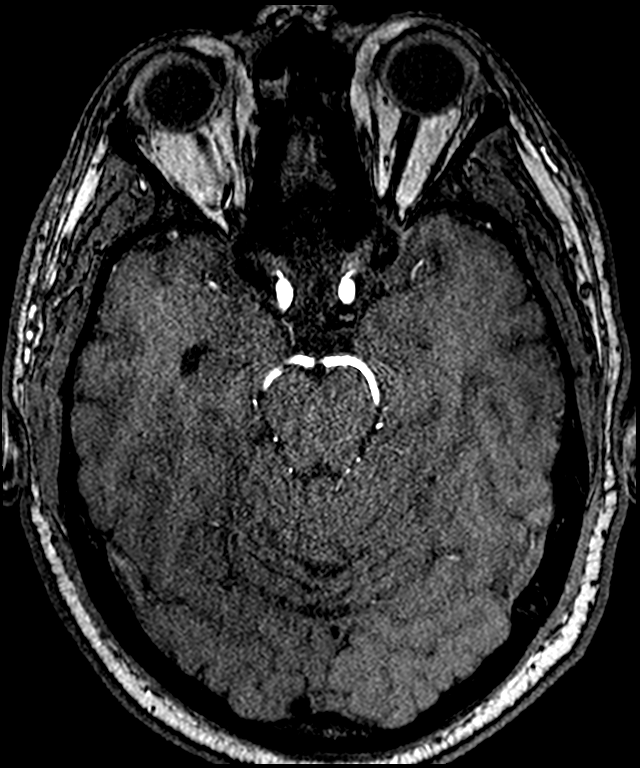
[im 76/149]
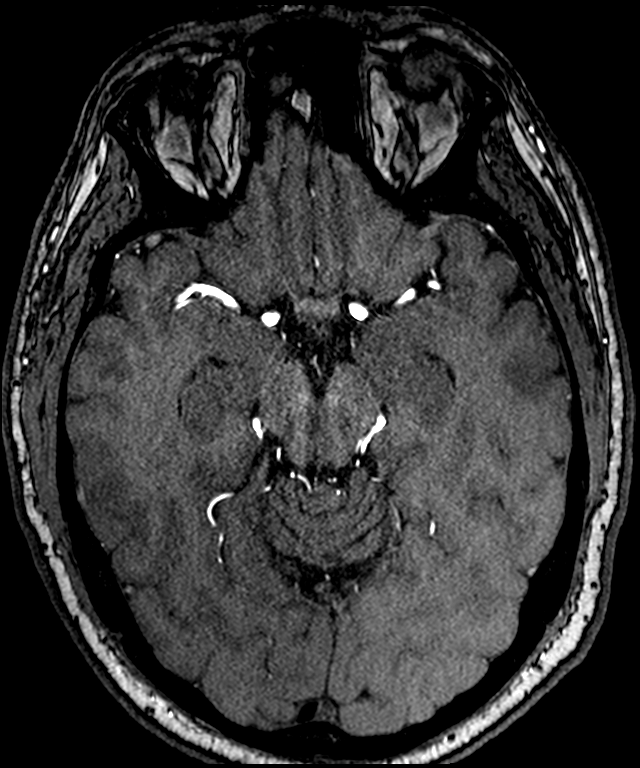
[im 86/149]
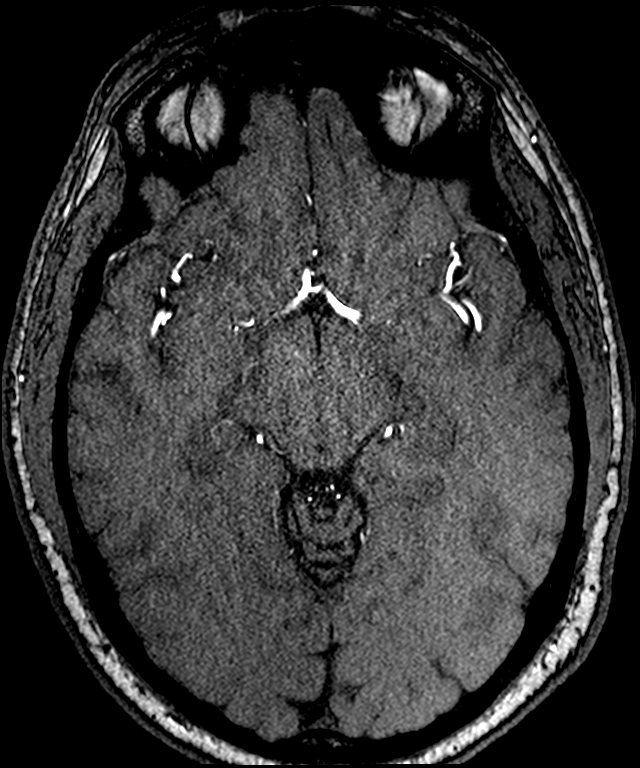
[im 104/149]
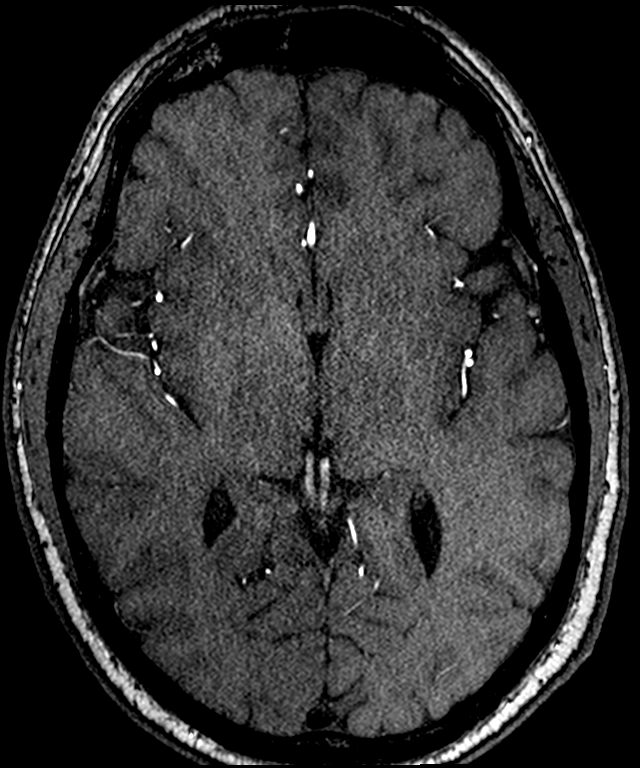
[im 123/149]
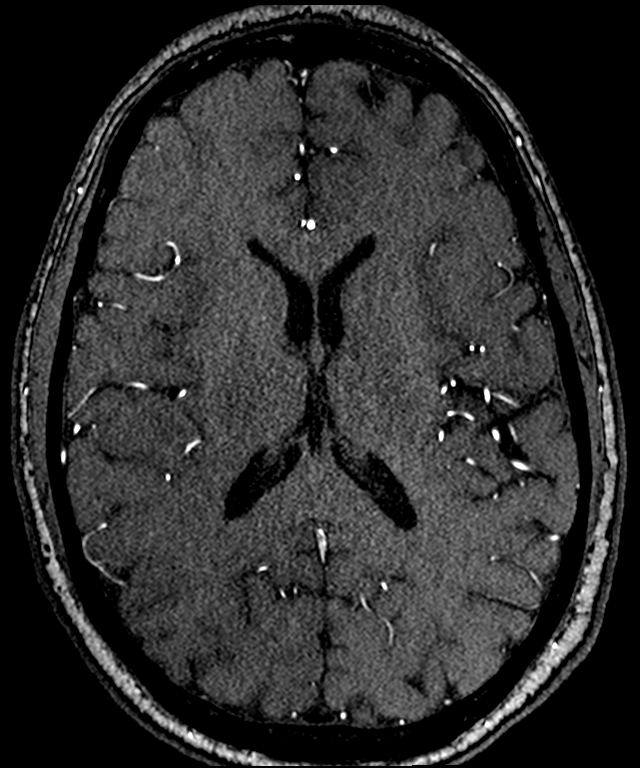
[im 126/149]
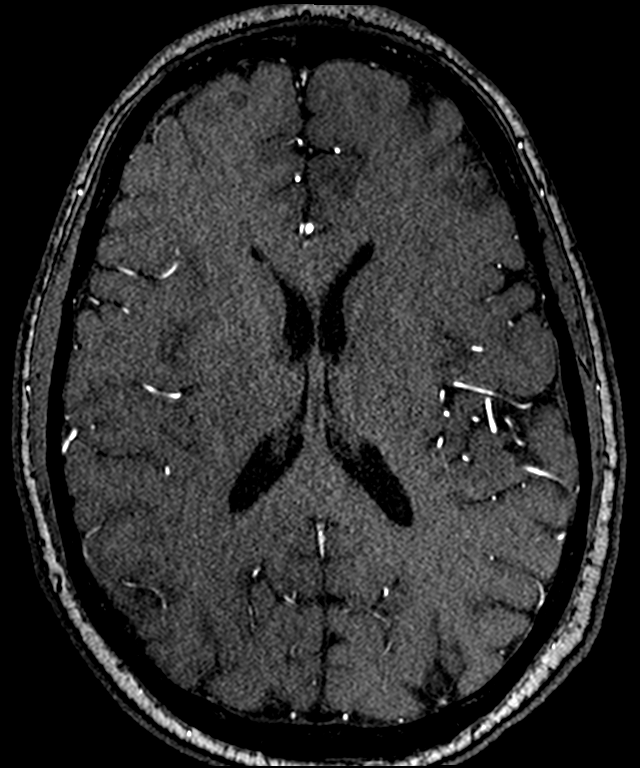

[10 of 48 positions shown; findings below may reference images not displayed]

FINDINGS: MRI HEAD FINDINGS

Brain: Linear T2/FLAIR hyperintensity involving the left corona
radiata with associated DWI hyperintensity measuring up to 9 mm. No
focal ADC or enhancing correlate. Few punctate FLAIR hyperintense
foci are seen within the bifrontal subcortical white matter. No
intracranial hemorrhage. No midline shift, ventriculomegaly or
extra-axial fluid collection. No mass lesion. No abnormal
enhancement. Cerebral volume is within normal limits.

Vascular: Please see MRA.

Skull and upper cervical spine: Normal marrow signal.

Sinuses/Orbits: Normal orbits. Clear paranasal sinuses. No mastoid
effusion.

Other: None.

MRA HEAD FINDINGS

Anterior circulation: The bilateral internal carotids, anterior and
middle cerebral arteries are patent and normal caliber. Normal
anterior communicating artery. Bilateral Pcomms are either
hypoplastic or absent. No significant stenosis, proximal occlusion,
aneurysm, or vascular malformation.

Posterior circulation: Dominant right vertebral artery. The
bilateral V4 segments, basilar superior cerebellar and posterior
cerebral arteries are patent and normal caliber. Normal bilateral
PICA and AICA, originating from the proximal basilar artery. No
significant stenosis, proximal occlusion, aneurysm, or vascular
malformation.

Venous sinuses: Patent.

Anatomic variants: As detailed above.
IMPRESSION: MRI HEAD:

Left corona radiata diffusion-weighted/FLAIR hyperintensity is
nonspecific. Differential includes active demyelinating lesion
versus subacute insult.

Few punctate bifrontal white matter FLAIR hyperintensities are also
nonspecific.

MRA HEAD:

Normal MRA head.

ADDENDUM:
These results were called by telephone at the time of interpretation
on [DATE] at [DATE] to provider MACHE , who verbally
acknowledged these results.

*** End of Addendum ***
FINDINGS: MRI HEAD FINDINGS

Brain: Linear T2/FLAIR hyperintensity involving the left corona
radiata with associated DWI hyperintensity measuring up to 9 mm. No
focal ADC or enhancing correlate. Few punctate FLAIR hyperintense
foci are seen within the bifrontal subcortical white matter. No
intracranial hemorrhage. No midline shift, ventriculomegaly or
extra-axial fluid collection. No mass lesion. No abnormal
enhancement. Cerebral volume is within normal limits.

Vascular: Please see MRA.

Skull and upper cervical spine: Normal marrow signal.

Sinuses/Orbits: Normal orbits. Clear paranasal sinuses. No mastoid
effusion.

Other: None.

MRA HEAD FINDINGS

Anterior circulation: The bilateral internal carotids, anterior and
middle cerebral arteries are patent and normal caliber. Normal
anterior communicating artery. Bilateral Pcomms are either
hypoplastic or absent. No significant stenosis, proximal occlusion,
aneurysm, or vascular malformation.

Posterior circulation: Dominant right vertebral artery. The
bilateral V4 segments, basilar superior cerebellar and posterior
cerebral arteries are patent and normal caliber. Normal bilateral
PICA and AICA, originating from the proximal basilar artery. No
significant stenosis, proximal occlusion, aneurysm, or vascular
malformation.

Venous sinuses: Patent.

Anatomic variants: As detailed above.
IMPRESSION: MRI HEAD:

Left corona radiata diffusion-weighted/FLAIR hyperintensity is
nonspecific. Differential includes active demyelinating lesion
versus subacute insult.

Few punctate bifrontal white matter FLAIR hyperintensities are also
nonspecific.

MRA HEAD:

Normal MRA head.

## 2020-07-26 IMAGING — MR MR HEAD WO/W CM
12 series · 48 of 48 positions shown · IV contrast (multihance)
Comparison: None.
COMPARISON: None.

Addendum:
CLINICAL DATA: Transient ischemic attack.

EXAM:
MRI HEAD WITHOUT AND WITH CONTRAST
MRA HEAD WITHOUT CONTRAST
TECHNIQUE: Multiplanar, multiecho pulse sequences of the brain and surrounding
structures were obtained without and with intravenous contrast.
Angiographic images of the head were obtained using MRA technique
without contrast.
CONTRAST:  20mL MULTIHANCE GADOBENATE DIMEGLUMINE 529 MG/ML IV SOLN

[Series 5: T1 · sagittal · 4.0mm · 0.75mm/px · 1 of 33 slices shown (1 of 3)]
[im 1/33]
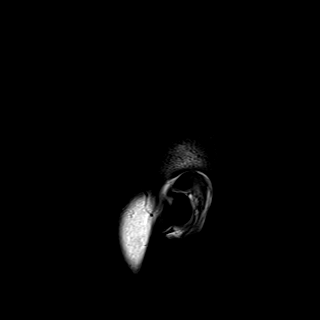

[Series 6: DWI · axial · 3.0mm · 1.44mm/px · z∈[-75,+72]mm · 4 of 92 slices shown (1 of 4)]
[im 1/92]
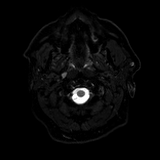
[im 31/92]
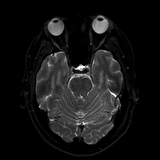
[im 61/92]
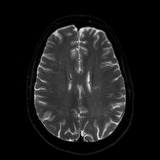
[im 92/92]
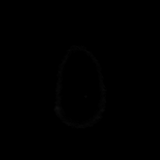

[Series 7: DWI · axial · 3.0mm · 1.44mm/px · z∈[-75,+72]mm · 3 of 45 slices shown (2 of 4)]
[im 1/45]
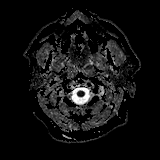
[im 23/45]
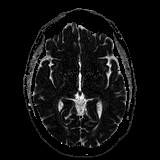
[im 45/45]
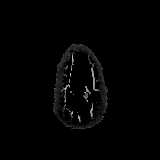

[Series 8: DWI · coronal · 5.0mm · 1.44mm/px · 4 of 70 slices shown (3 of 4)]
[im 1/70]
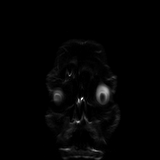
[im 24/70]
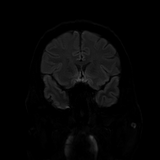
[im 47/70]
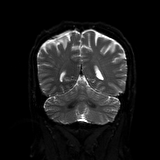
[im 70/70]
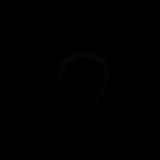

[Series 9: DWI · coronal · 5.0mm · 1.44mm/px · 2 of 35 slices shown (4 of 4)]
[im 1/35]
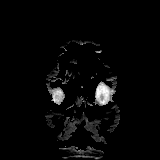
[im 35/35]
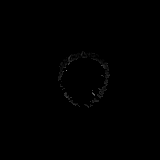

[Series 10: T2 · axial · 4.0mm · 0.36mm/px · z∈[-82,+79]mm · 2 of 32 slices shown]
[im 1/32]
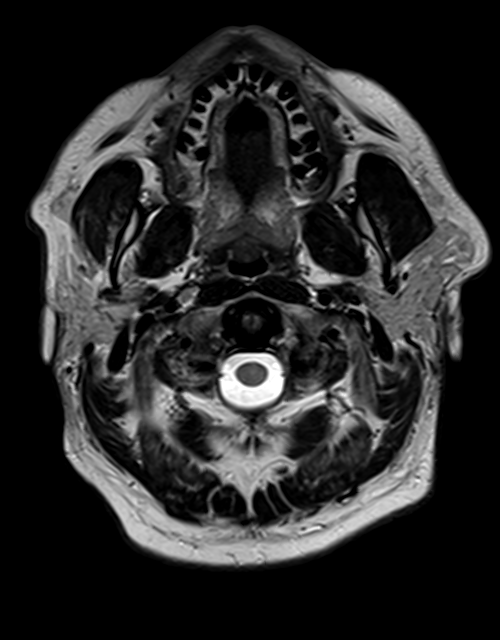
[im 32/32]
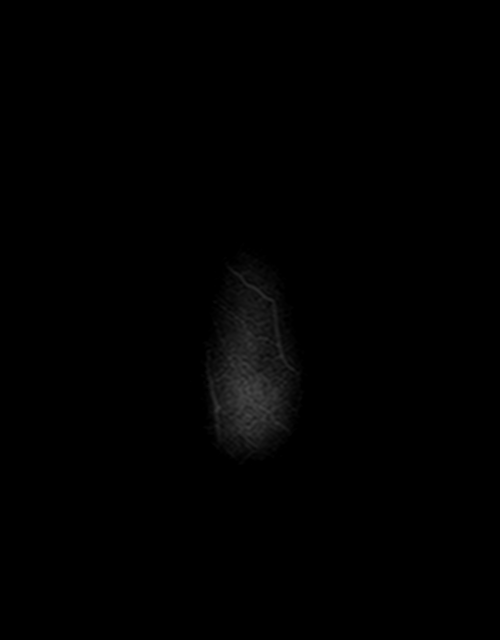

[Series 11: FLAIR · axial · 3.0mm · 0.72mm/px · z∈[-82,+79]mm · 2 of 28 slices shown]
[im 1/28]
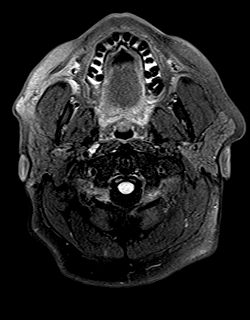
[im 28/28]
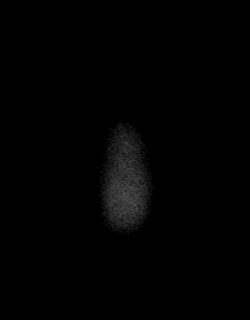

[Series 13: swi_images · axial · 1.5mm · 0.90mm/px · z∈[-79,+75]mm · 6 of 104 slices shown]
[im 1/104]
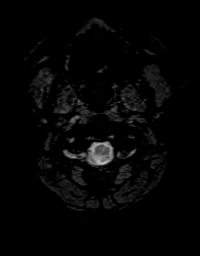
[im 21/104]
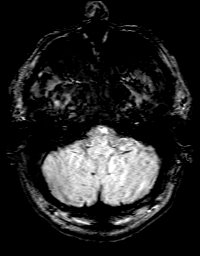
[im 42/104]
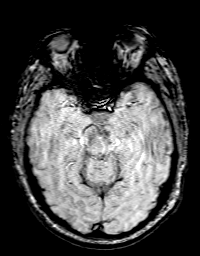
[im 62/104]
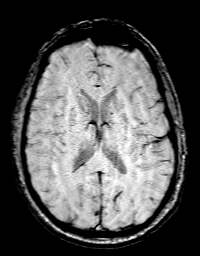
[im 83/104]
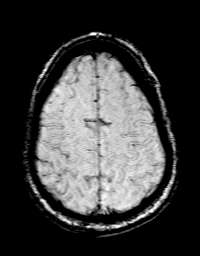
[im 104/104]
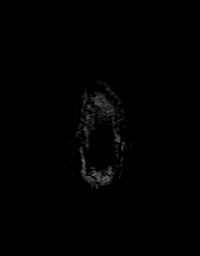

[Series 14: T1 · axial · 1.0mm · 0.94mm/px · z∈[-89,+85]mm · 10 of 176 slices shown (2 of 3)]
[im 1/176]
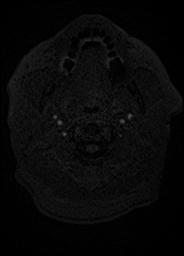
[im 20/176]
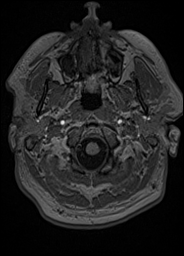
[im 39/176]
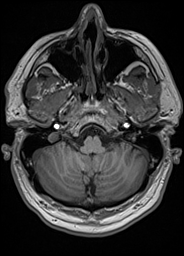
[im 59/176]
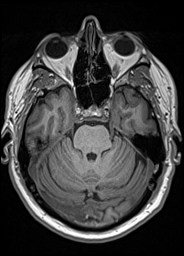
[im 78/176]
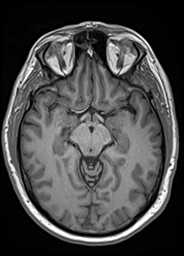
[im 98/176]
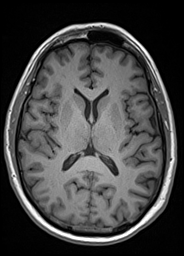
[im 117/176]
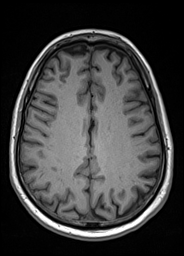
[im 137/176]
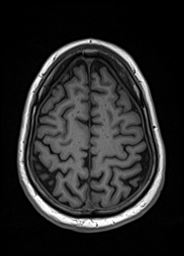
[im 156/176]
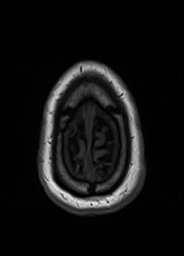
[im 176/176]
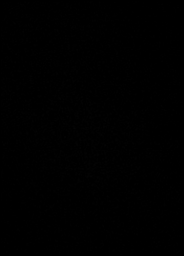

[Series 15: T2 post-contrast · coronal · 4.5mm · 0.36mm/px · 2 of 36 slices shown]
[im 1/36]
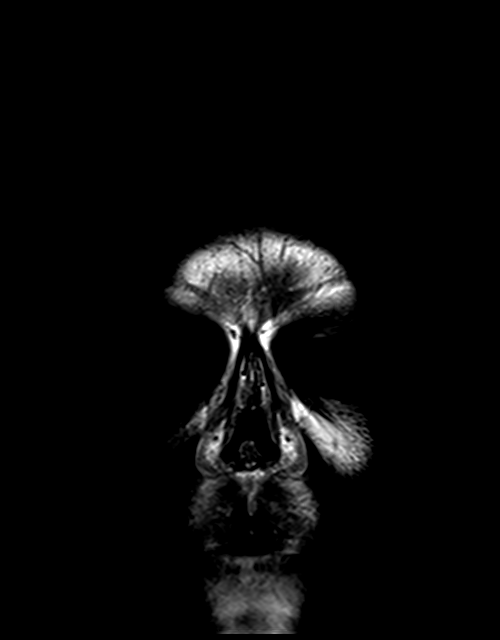
[im 36/36]
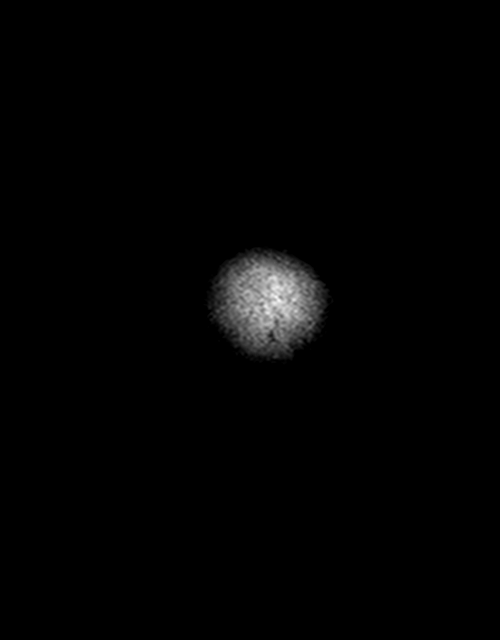

[Series 16: T1 · axial · 1.0mm · 0.94mm/px · z∈[-89,+85]mm · 10 of 176 slices shown (3 of 3)]
[im 1/176]
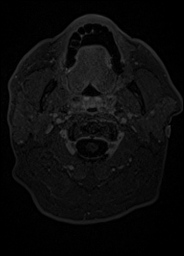
[im 20/176]
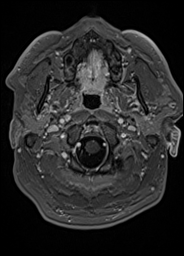
[im 39/176]
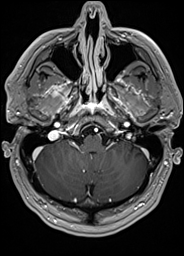
[im 59/176]
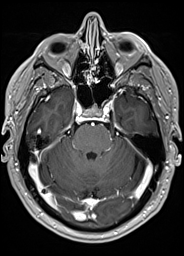
[im 78/176]
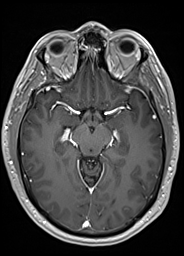
[im 98/176]
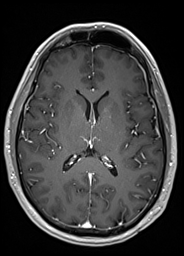
[im 117/176]
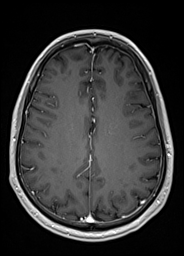
[im 137/176]
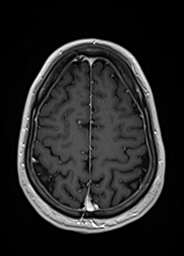
[im 156/176]
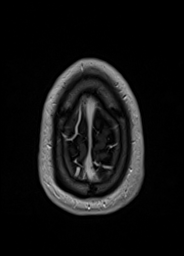
[im 176/176]
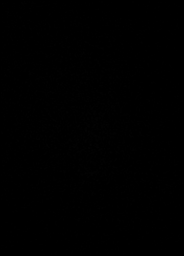

[Series 17: T1 post-contrast · coronal · 4.5mm · 0.90mm/px · 2 of 36 slices shown]
[im 1/36]
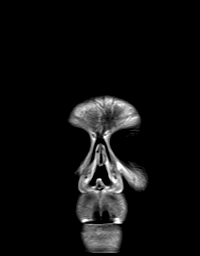
[im 36/36]
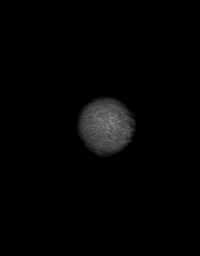

[48 of 48 positions shown; findings below may reference images not displayed]

FINDINGS: MRI HEAD FINDINGS

Brain: Linear T2/FLAIR hyperintensity involving the left corona
radiata with associated DWI hyperintensity measuring up to 9 mm. No
focal ADC or enhancing correlate. Few punctate FLAIR hyperintense
foci are seen within the bifrontal subcortical white matter. No
intracranial hemorrhage. No midline shift, ventriculomegaly or
extra-axial fluid collection. No mass lesion. No abnormal
enhancement. Cerebral volume is within normal limits.

Vascular: Please see MRA.

Skull and upper cervical spine: Normal marrow signal.

Sinuses/Orbits: Normal orbits. Clear paranasal sinuses. No mastoid
effusion.

Other: None.

MRA HEAD FINDINGS

Anterior circulation: The bilateral internal carotids, anterior and
middle cerebral arteries are patent and normal caliber. Normal
anterior communicating artery. Bilateral Pcomms are either
hypoplastic or absent. No significant stenosis, proximal occlusion,
aneurysm, or vascular malformation.

Posterior circulation: Dominant right vertebral artery. The
bilateral V4 segments, basilar superior cerebellar and posterior
cerebral arteries are patent and normal caliber. Normal bilateral
PICA and AICA, originating from the proximal basilar artery. No
significant stenosis, proximal occlusion, aneurysm, or vascular
malformation.

Venous sinuses: Patent.

Anatomic variants: As detailed above.
IMPRESSION: MRI HEAD:

Left corona radiata diffusion-weighted/FLAIR hyperintensity is
nonspecific. Differential includes active demyelinating lesion
versus subacute insult.

Few punctate bifrontal white matter FLAIR hyperintensities are also
nonspecific.

MRA HEAD:

Normal MRA head.

ADDENDUM:
These results were called by telephone at the time of interpretation
on [DATE] at [DATE] to provider MACHE , who verbally
acknowledged these results.

*** End of Addendum ***
FINDINGS: MRI HEAD FINDINGS

Brain: Linear T2/FLAIR hyperintensity involving the left corona
radiata with associated DWI hyperintensity measuring up to 9 mm. No
focal ADC or enhancing correlate. Few punctate FLAIR hyperintense
foci are seen within the bifrontal subcortical white matter. No
intracranial hemorrhage. No midline shift, ventriculomegaly or
extra-axial fluid collection. No mass lesion. No abnormal
enhancement. Cerebral volume is within normal limits.

Vascular: Please see MRA.

Skull and upper cervical spine: Normal marrow signal.

Sinuses/Orbits: Normal orbits. Clear paranasal sinuses. No mastoid
effusion.

Other: None.

MRA HEAD FINDINGS

Anterior circulation: The bilateral internal carotids, anterior and
middle cerebral arteries are patent and normal caliber. Normal
anterior communicating artery. Bilateral Pcomms are either
hypoplastic or absent. No significant stenosis, proximal occlusion,
aneurysm, or vascular malformation.

Posterior circulation: Dominant right vertebral artery. The
bilateral V4 segments, basilar superior cerebellar and posterior
cerebral arteries are patent and normal caliber. Normal bilateral
PICA and AICA, originating from the proximal basilar artery. No
significant stenosis, proximal occlusion, aneurysm, or vascular
malformation.

Venous sinuses: Patent.

Anatomic variants: As detailed above.
IMPRESSION: MRI HEAD:

Left corona radiata diffusion-weighted/FLAIR hyperintensity is
nonspecific. Differential includes active demyelinating lesion
versus subacute insult.

Few punctate bifrontal white matter FLAIR hyperintensities are also
nonspecific.

MRA HEAD:

Normal MRA head.

## 2020-07-26 MED ORDER — GADOBENATE DIMEGLUMINE 529 MG/ML IV SOLN
20.0000 mL | Freq: Once | INTRAVENOUS | Status: AC | PRN
  Administered 2020-07-26: 20 mL via INTRAVENOUS

## 2020-07-27 ENCOUNTER — Other Ambulatory Visit: Payer: Self-pay | Admitting: General Practice

## 2020-07-27 DIAGNOSIS — G379 Demyelinating disease of central nervous system, unspecified: Secondary | ICD-10-CM

## 2020-07-28 ENCOUNTER — Encounter: Payer: Self-pay | Admitting: Neurology

## 2020-07-28 ENCOUNTER — Other Ambulatory Visit: Payer: Self-pay

## 2020-07-28 ENCOUNTER — Ambulatory Visit (INDEPENDENT_AMBULATORY_CARE_PROVIDER_SITE_OTHER): Payer: BC Managed Care – PPO | Admitting: Neurology

## 2020-07-28 VITALS — BP 123/75 | HR 74 | Ht 74.0 in | Wt 235.4 lb

## 2020-07-28 DIAGNOSIS — G459 Transient cerebral ischemic attack, unspecified: Secondary | ICD-10-CM

## 2020-07-28 NOTE — Progress Notes (Signed)
Chief Complaint  Patient presents with  . Abnormal MRI    Jason Floyd, New Pt-  Int ref , wife- [name] "was dropping things, have episode of aphasia, ? TIA, getting cardiac work up"    HISTORICAL  Jason Floyd is a 54 year old male, seen in request by his primary care physician Dr. Neena Rhymes for evaluation of transient left hand weakness, he is accompanied by his wife at today's visit July 28, 2020  I reviewed and summarized the referring note.  Past medical history Hyperlipidemia,  Hypertension DM. Insomnia History of cervical Decompression in February 2014 for neck pain, left cervical radiculopathy  On June 10, 2020, while holding painting cans in both hands, he noticed sudden onset left hand weakness, dropped hands from his left hand, lasting for few minutes, he also described difficulty talking, when he tried to communicate with his wife, jumbled words came out, lasting less than 5 minutes, he quickly recovered back to his baseline, he did not seek medical attention immediately,  Later when he was evaluated by his primary care physician, concerning for TIA, was referred for MRI of the brain, also started on aspirin 81 mg daily  I personally reviewed MRI of the brain without contrast July 26, 2020, left coronary radiata diffuse weighted/FLAIR hyperintensity, could represent subacute stroke, few bilateral frontal white matter small vessel disease  Work-up also including echocardiogram June 22, 2020, ejection fraction 25 to 30%, left ventricle has severely decreased function, global hypokinesia, internal cavity size was mildly dilated, there was mild concentric left ventricular hypertrophy, grade 2 diastolic dysfunction, left atrial size was severely dilated,  I reviewed cardiology Dr. Prescott Gum evaluation June 30, 2020, past medical history of systolic heart failure due to presumed nonischemic cardiomyopathy, formal polysubstance abuse, former smoker, had recovered  cardiac function for several years, ejection fraction in 2008 15%, in the setting of polysubstance abuse, viral sickness few months prior, echocardiogram February 2009, ejection fraction 55 to 60%, October 2020, 55 to 60%,  Cardiac cath August 2021, total occlusion of medium-sized LAD, ejection fraction 25 to 30%, mildly elevated filling pressures with normal cardiac output,  More cardiac evaluation is underway, REVIEW OF SYSTEMS: Full 14 system review of systems performed and notable only for as above All other review of systems were negative.  ALLERGIES: No Known Allergies  HOME MEDICATIONS: Current Outpatient Medications  Medication Sig Dispense Refill  . allopurinol (ZYLOPRIM) 300 MG tablet TAKE 2 TABLETS BY MOUTH DAILY. (Patient taking differently: Take 600 mg by mouth daily. ) 60 tablet 6  . aspirin EC 81 MG tablet Take 81 mg by mouth daily. Swallow whole.    Marland Kitchen atorvastatin (LIPITOR) 20 MG tablet TAKE 1 TABLET BY MOUTH DAILY. (Patient taking differently: Take 20 mg by mouth daily. ) 30 tablet 6  . calcium carbonate (TUMS - DOSED IN MG ELEMENTAL CALCIUM) 500 MG chewable tablet Chew 2-3 tablets by mouth daily as needed for indigestion or heartburn.    . carvedilol (COREG) 12.5 MG tablet Take 1 tablet (12.5 mg total) by mouth 2 (two) times daily with a meal. 180 tablet 1  . cetirizine (ZYRTEC) 10 MG tablet Take 10 mg by mouth daily.    . diclofenac Sodium (VOLTAREN) 1 % GEL Apply 1 application topically 4 (four) times daily as needed (pain).     Marland Kitchen FARXIGA 5 MG TABS tablet TAKE 1 TABLET BY MOUTH DAILY 30 tablet 6  . fenofibrate 160 MG tablet TAKE 1 TABLET BY MOUTH DAILY. (Patient taking differently: Take  160 mg by mouth daily. ) 90 tablet 1  . meloxicam (MOBIC) 15 MG tablet Take 1 tablet (15 mg total) by mouth daily. 30 tablet 3  . metFORMIN (GLUCOPHAGE) 1000 MG tablet TAKE 1 TABLET BY MOUTH 2 TIMES DAILY WITH A MEAL. (Patient taking differently: Take 1,000 mg by mouth 2 (two) times daily.  ) 180 tablet 0  . methocarbamol (ROBAXIN) 500 MG tablet Take 1 tablet (500 mg total) by mouth at bedtime. 90 tablet 1  . Naphazoline HCl (CLEAR EYES OP) Place 1 drop into both eyes daily as needed (sore eyes).    . pantoprazole (PROTONIX) 40 MG tablet TAKE 1 TABLET BY MOUTH DAILY. 30 tablet 3  . sacubitril-valsartan (ENTRESTO) 97-103 MG Take 1 tablet by mouth 2 (two) times daily. 60 tablet 6  . sodium chloride (OCEAN) 0.65 % SOLN nasal spray Place 1 spray into both nostrils as needed (dryness).    . traZODone (DESYREL) 100 MG tablet TAKE 1 TABLET BY MOUTH AT BEDTIME. (Patient taking differently: Take 100 mg by mouth at bedtime. ) 30 tablet 6  . triamcinolone (KENALOG) 0.025 % ointment APPLY TO THE AFFECTED AREA 2 TIMES DAILY. (Patient not taking: Reported on 07/28/2020) 80 g 1   No current facility-administered medications for this visit.    PAST MEDICAL HISTORY: Past Medical History:  Diagnosis Date  . Allergy   . Arthritis   . CHF (congestive heart failure) (HCC)   . Diabetes mellitus without complication (HCC)   . Drug abuse (HCC)   . Eczema   . Gout   . Hyperlipidemia   . Nephrolithiasis   . PE (pulmonary embolism)     PAST SURGICAL HISTORY: Past Surgical History:  Procedure Laterality Date  . CERVICAL FUSION    . RIGHT/LEFT HEART CATH AND CORONARY ANGIOGRAPHY N/A 07/01/2020   Procedure: RIGHT/LEFT HEART CATH AND CORONARY ANGIOGRAPHY;  Surgeon: Dolores Patty, MD;  Location: [facility];  Service: Cardiovascular;  Laterality: N/A;  . SHOULDER ARTHROSCOPY Left   . WISDOM TOOTH EXTRACTION      FAMILY HISTORY: Family History  Problem Relation Age of Onset  . Dementia Mother        frontal-temporal  . Stroke Mother   . Breast cancer Maternal Aunt   . Other Brother        overdose  . Colon cancer Neg Hx     SOCIAL HISTORY: Social History   Socioeconomic History  . Marital status: Married    Spouse name: [name]  . Number of children: Not on file  . Years  of education: Not on file  . Highest education level: Bachelor's degree (e.g., BA, AB, BS)  Occupational History  . Not on file  Tobacco Use  . Smoking status: Current Some Day Smoker    Packs/day: 0.25    Years: 31.00    Pack years: 7.75    Types: Cigarettes  . Smokeless tobacco: Never Used  . Tobacco comment: 07/28/20 trying to quit  Vaping Use  . Vaping Use: Never used  Substance and Sexual Activity  . Alcohol use: Not Currently    Alcohol/week: 0.0 standard drinks    Comment: occasionally  . Drug use: Not Currently    Types: Marijuana  . Sexual activity: Not on file  Other Topics Concern  . Not on file  Social History Narrative   Lives with wife   Caffeine- coffee 4 c daily   Social Determinants of Health   Financial Resource Strain:   .  Difficulty of Paying Living Expenses: Not on file  Food Insecurity:   . Worried About Programme researcher, broadcasting/film/video in the Last Year: Not on file  . Ran Out of Food in the Last Year: Not on file  Transportation Needs:   . Lack of Transportation (Medical): Not on file  . Lack of Transportation (Non-Medical): Not on file  Physical Activity:   . Days of Exercise per Week: Not on file  . Minutes of Exercise per Session: Not on file  Stress:   . Feeling of Stress : Not on file  Social Connections:   . Frequency of Communication with Friends and Family: Not on file  . Frequency of Social Gatherings with Friends and Family: Not on file  . Attends Religious Services: Not on file  . Active Member of Clubs or Organizations: Not on file  . Attends Banker Meetings: Not on file  . Marital Status: Not on file  Intimate Partner Violence:   . Fear of Current or Ex-Partner: Not on file  . Emotionally Abused: Not on file  . Physically Abused: Not on file  . Sexually Abused: Not on file     PHYSICAL EXAM   Vitals:   07/28/20 0739  BP: 123/75  Pulse: 74  Weight: 235 lb 6.4 oz (106.8 kg)  Height: 6\' 2"  (1.88 m)   Not recorded      Body mass index is 30.22 kg/m.  PHYSICAL EXAMNIATION:  Gen: NAD, conversant, well nourised, well groomed                     Cardiovascular: Regular rate rhythm, no peripheral edema, warm, nontender. Eyes: Conjunctivae clear without exudates or hemorrhage Neck: Supple, no carotid bruits. Pulmonary: Clear to auscultation bilaterally   NEUROLOGICAL EXAM:  MENTAL STATUS: Speech:    Speech is normal; fluent and spontaneous with normal comprehension.  Cognition:     Orientation to time, place and person     Normal recent and remote memory     Normal Attention span and concentration     Normal Language, naming, repeating,spontaneous speech     Fund of knowledge   CRANIAL NERVES: CN II: Visual fields are full to confrontation. Pupils are round equal and briskly reactive to light. CN III, IV, VI: extraocular movement are normal. No ptosis. CN V: Facial sensation is intact to light touch CN VII: Face is symmetric with normal eye closure  CN VIII: Hearing is normal to causal conversation. CN IX, X: Phonation is normal. CN XI: Head turning and shoulder shrug are intact  MOTOR: There is no pronator drift of out-stretched arms. Muscle bulk and tone are normal. Muscle strength is normal.  REFLEXES: Reflexes are 2+ and symmetric at the biceps, triceps, knees, and ankles. Plantar responses are flexor.  SENSORY: Intact to light touch, pinprick and vibratory sensation are intact in fingers and toes.  COORDINATION: There is no trunk or limb dysmetria noted.  GAIT/STANCE: Posture is normal. Gait is steady with normal steps, base, arm swing, and turning. Heel and toe walking are normal. Tandem gait is normal.  Romberg is absent.   DIAGNOSTIC DATA (LABS, IMAGING, TESTING) - I reviewed patient records, labs, notes, testing and imaging myself where available.   ASSESSMENT AND PLAN  Jason Floyd is a 54 y.o. male   Transient ischemic event on June 10, 2020  Multiple vascular  risk factor, hypertension, hyperlipidemia, diabetes, congestive heart failure, coronary artery disease, previous history of polysubstance abuse  Now aspirin 81 mg daily  Ultrasound of carotid artery showed less than 39% stenosis of bilateral internal carotid artery, anterograde flow of bilateral vertebral artery,  MRI of the brain with without contrast on July 26, 2020 showed left corona radiata diffuse weighted/FLAIR hyperintensity, suggestive of subacute insult, but this particular lesion is less likely explain patient's sudden onset left hand weakness, aphasia on June 10, 2020, this could be right frontal lesion, versus right plus left frontal lesion,  Echocardiogram in August 2021, ejection fraction 25 to 30%, diffuse hypokinesia, grade 2 diastolic dysfunction  Continue work with cardiologist, increase water intake, optimize vascular risk factors,   Levert Feinstein, M.D. Ph.D.  [facility] [address] [place], [place] [address] Ph: [phone] Fax: [phone]  CC:  Sheliah Hatch, MD [address] [place],  [place] [address]

## 2020-07-29 ENCOUNTER — Telehealth (HOSPITAL_COMMUNITY): Payer: Self-pay | Admitting: Emergency Medicine

## 2020-07-29 ENCOUNTER — Encounter: Payer: Self-pay | Admitting: Neurology

## 2020-07-29 NOTE — Telephone Encounter (Signed)
Attempted to call patient regarding upcoming cardiac CT appointment. Left message on voicemail with name and callback number Rockwell Alexandria RN Navigator Cardiac Imaging [facility] [phone] Office [phone] Cell   Pt attempted to call back but had poor connection.   Left detailed message on his VM with callback number for questions

## 2020-07-29 NOTE — Telephone Encounter (Signed)
Attempted to call patient regarding upcoming cardiac CT appointment. °Left message on voicemail with name and callback number °Raford Brissett RN Navigator Cardiac Imaging °[facility] °[phone] Office °[phone] Cell ° °

## 2020-08-02 ENCOUNTER — Other Ambulatory Visit: Payer: Self-pay

## 2020-08-02 ENCOUNTER — Ambulatory Visit (HOSPITAL_COMMUNITY)
Admission: RE | Admit: 2020-08-02 | Discharge: 2020-08-02 | Disposition: A | Discharge status: Home / Self Care | Payer: BC Managed Care – PPO | Source: Ambulatory Visit | Attending: Internal Medicine | Admitting: Internal Medicine

## 2020-08-02 DIAGNOSIS — I5022 Chronic systolic (congestive) heart failure: Secondary | ICD-10-CM | POA: Diagnosis not present

## 2020-08-02 DIAGNOSIS — I25110 Atherosclerotic heart disease of native coronary artery with unstable angina pectoris: Secondary | ICD-10-CM | POA: Diagnosis present

## 2020-08-02 IMAGING — MR MR CARD MORPHOLOGY WO/W CM
45 of 48 series · 45 of 48 positions shown · IV contrast (gadavist)
Comparison: none

CLINICAL DATA: Ischemic cardiomyopathy, assess viability.

EXAM:
CARDIAC MRI
TECHNIQUE: The patient was scanned on a 1.5 Tesla GE magnet. A dedicated
cardiac coil was used. Functional imaging was done using Fiesta
sequences. [DATE], and 4 chamber views were done to assess for RWMA's.
Modified MEKOVEC rule using a short axis stack was used to
calculate an ejection fraction on a dedicated work station using
Circle software. The patient received 8 cc of Gadavist. After 10
minutes inversion recovery sequences were used to assess for
infiltration and scar tissue

[Series 4: t2_[id number]_bh · axial · 8.0mm · 1.41mm/px · 1 of 16 slices shown]
[im 1/16]
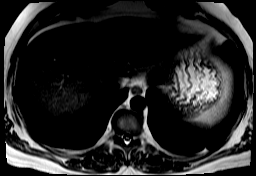

[Series 8: bSSFP · oblique · 8.0mm · 1.65mm/px · 1 of 25 slices shown (1 of 24)]
[im 1/25]
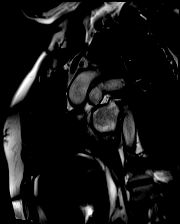

[Series 9: bSSFP · oblique · 8.0mm · 1.65mm/px · 1 of 25 slices shown (2 of 24)]
[im 1/25]
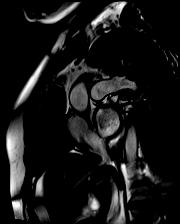

[Series 10: bSSFP · oblique · 8.0mm · 1.65mm/px · 1 of 25 slices shown (3 of 24)]
[im 1/25]
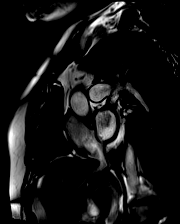

[Series 11: bSSFP · oblique · 8.0mm · 1.65mm/px · 1 of 25 slices shown (4 of 24)]
[im 1/25]
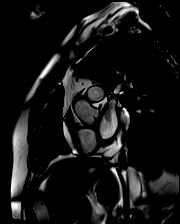

[Series 12: bSSFP · oblique · 8.0mm · 1.65mm/px · 1 of 25 slices shown (5 of 24)]
[im 1/25]
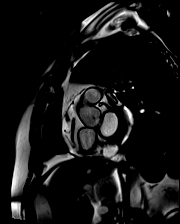

[Series 13: bSSFP · oblique · 8.0mm · 1.65mm/px · 1 of 25 slices shown (6 of 24)]
[im 1/25]
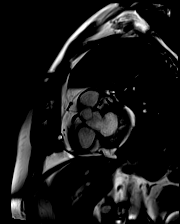

[Series 14: bSSFP · oblique · 8.0mm · 1.65mm/px · 1 of 25 slices shown (7 of 24)]
[im 1/25]
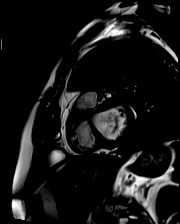

[Series 15: bSSFP · oblique · 8.0mm · 1.65mm/px · 1 of 25 slices shown (8 of 24)]
[im 1/25]
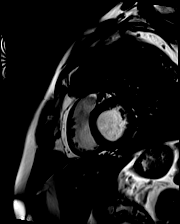

[Series 16: bSSFP · oblique · 8.0mm · 1.65mm/px · 1 of 25 slices shown (9 of 24)]
[im 1/25]
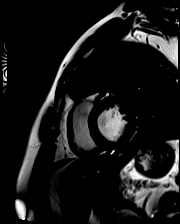

[Series 17: bSSFP · oblique · 8.0mm · 1.65mm/px · 1 of 25 slices shown (10 of 24)]
[im 1/25]
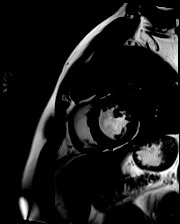

[Series 18: bSSFP · oblique · 8.0mm · 1.65mm/px · 1 of 25 slices shown (11 of 24)]
[im 1/25]
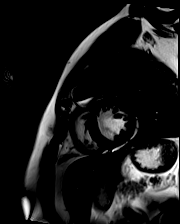

[Series 19: bSSFP · oblique · 8.0mm · 1.65mm/px · 1 of 25 slices shown (12 of 24)]
[im 1/25]
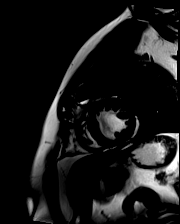

[Series 20: bSSFP · oblique · 8.0mm · 1.65mm/px · 1 of 25 slices shown (13 of 24)]
[im 1/25]
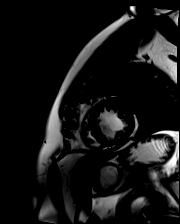

[Series 21: bSSFP · oblique · 8.0mm · 1.65mm/px · 1 of 25 slices shown (14 of 24)]
[im 1/25]
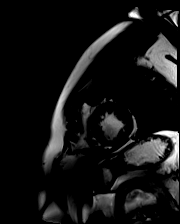

[Series 22: bSSFP · oblique · 8.0mm · 1.65mm/px · 1 of 25 slices shown (15 of 24)]
[im 1/25]
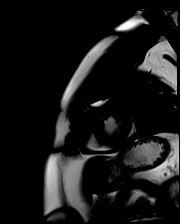

[Series 23: bSSFP · oblique · 8.0mm · 1.65mm/px · 1 of 25 slices shown (16 of 24)]
[im 1/25]
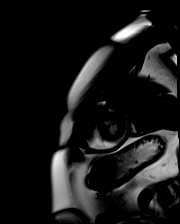

[Series 24: bSSFP · oblique · 8.0mm · 1.65mm/px · 1 of 25 slices shown (17 of 24)]
[im 1/25]
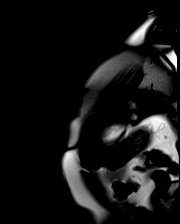

[Series 25: (id)_long_t1 · oblique · 8.0mm · 1.56mm/px · 1 of 24 slices shown]
[im 1/24]
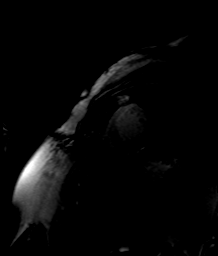

[Series 26: (id)_long_t1_moco · oblique · 8.0mm · 1.56mm/px · 1 of 24 slices shown]
[im 1/24]
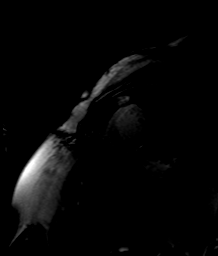

[Series 27: (id)_long_t1_moco_t1 · oblique · 8.0mm · 1.56mm/px · 1 of 3 slices shown (1 of 2)]
[im 1/3]
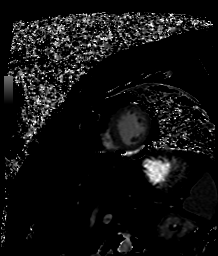

[Series 27: (id)_long_t1_moco_t1 · 1 of 3 slices shown (2 of 2)]
[im 1/3]
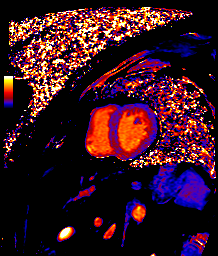

[Series 29: (id)_trufi · oblique · 8.0mm · 2.08mm/px · 1 of 9 slices shown]
[im 1/9]
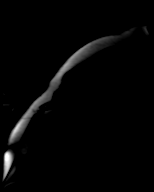

[Series 30: (id)_trufi_moco · oblique · 8.0mm · 2.08mm/px · 1 of 9 slices shown]
[im 1/9]
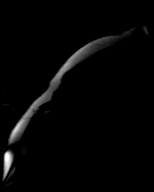

[Series 31: (id)_trufi_moco_t2 · oblique · 8.0mm · 2.08mm/px · 1 of 3 slices shown]
[im 1/3]
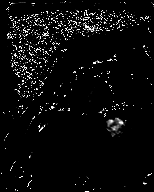

[Series 33: bSSFP · oblique · 6.0mm · 1.56mm/px · 1 of 25 slices shown (18 of 24)]
[im 1/25]
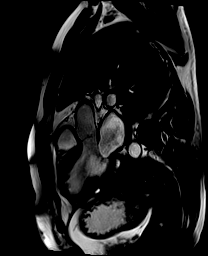

[Series 34: bSSFP · oblique · 6.0mm · 1.48mm/px · 1 of 25 slices shown (19 of 24)]
[im 1/25]
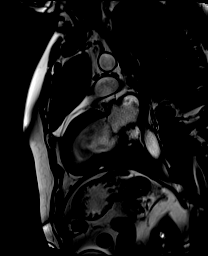

[Series 35: bSSFP · axial · 6.0mm · 1.56mm/px · 1 of 25 slices shown (20 of 24)]
[im 1/25]
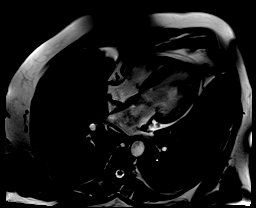

[Series 36: bSSFP · coronal · 6.0mm · 1.41mm/px · 1 of 25 slices shown (21 of 24)]
[im 1/25]
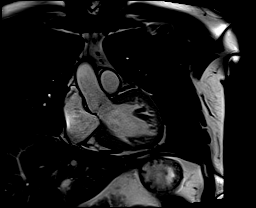

[Series 37: aortic valve cine · oblique · 6.0mm · 1.41mm/px · 1 of 25 slices shown]
[im 1/25]
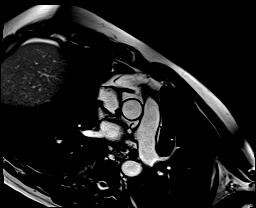

[Series 38: cine rvit · coronal · 6.0mm · 1.41mm/px · 1 of 25 slices shown]
[im 1/25]
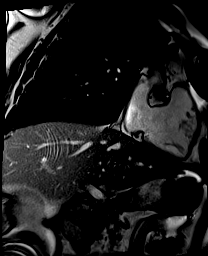

[Series 39: cine rvot · sagittal · 6.0mm · 1.41mm/px · 1 of 25 slices shown]
[im 1/25]
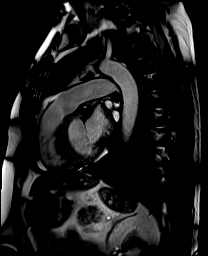

[Series 42: lge_single shot sa · oblique · 8.0mm · 1.98mm/px · 1 of 10 slices shown (1 of 4)]
[im 1/10]
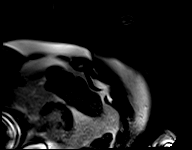

[Series 43: lge_single shot sa · oblique · 8.0mm · 1.98mm/px · 1 of 10 slices shown (2 of 4)]
[im 1/10]
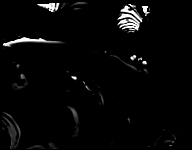

[Series 44: lge_single shot sa · oblique · 8.0mm · 1.98mm/px · 1 of 10 slices shown (3 of 4)]
[im 1/10]
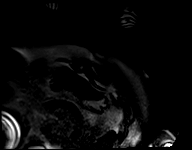

[Series 45: lge_single shot sa · oblique · 8.0mm · 1.98mm/px · 1 of 10 slices shown (4 of 4)]
[im 1/10]
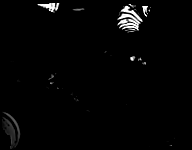

[Series 48: lge_single shot 4 · axial · 6.0mm · 1.98mm/px · 1 of 1 slices shown (1 of 2)]
[im 1/1]
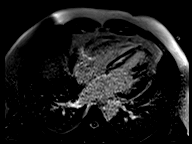

[Series 49: lge_single shot 4 · axial · 6.0mm · 1.98mm/px · 1 of 1 slices shown (2 of 2)]
[im 1/1]
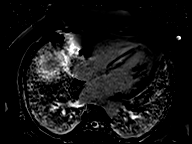

[Series 52: (id)_short_t1 · oblique · 8.0mm · 1.56mm/px · 1 of 27 slices shown]
[im 1/27]
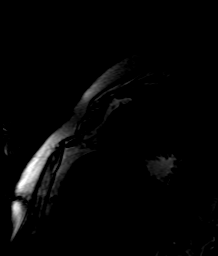

[Series 53: (id)_short_t1_moco · oblique · 8.0mm · 1.56mm/px · 1 of 27 slices shown]
[im 1/27]
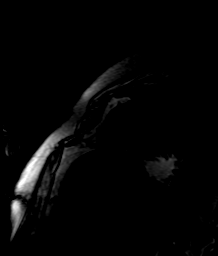

[Series 54: (id)_short_t1_moco_t1 · 1 of 3 slices shown (1 of 2)]
[im 1/3]
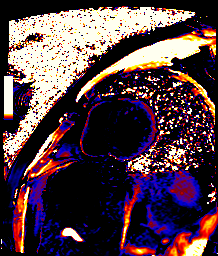

[Series 54: (id)_short_t1_moco_t1 · oblique · 8.0mm · 1.56mm/px · 1 of 3 slices shown (2 of 2)]
[im 1/3]
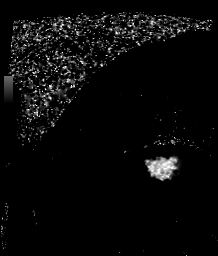

[Series 57: bSSFP · oblique · 6.0mm · 1.92mm/px · 1 of 15 slices shown (22 of 24)]
[im 1/15]
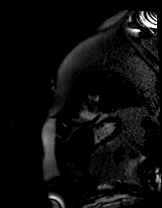

[Series 58: bSSFP · oblique · 6.0mm · 1.92mm/px · 1 of 15 slices shown (23 of 24)]
[im 1/15]
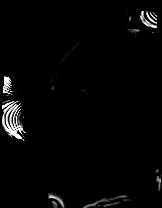

[Series 61: bSSFP · axial · 6.0mm · 1.73mm/px · 1 of 1 slices shown (24 of 24)]
[im 1/1]
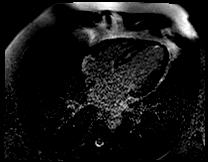

[45 of 48 positions shown; findings below may reference images not displayed]

FINDINGS: Limited images of the lung fields showed no gross abnormalities.

Normal left ventricular size with mild LV hypertrophy. Severe
hypokinesis of the entire anterior, anteroseptal, and inferoseptal
walls. Akinesis of the apex, the mid to apical inferior wall, and
the apical lateral wall. LV EF 26%. Normal right ventricular size
and with mildly reduced systolic function, EF 43%. Normal right
atrial size. Mildly dilated left atrium. Trileaflet aortic valve, no
significant regurgitation or stenosis. No significant mitral
regurgitation.

Delayed enhancement imaging: Unusual pattern. There is extensive,
confluent mid-wall late gadolinium enhancement (LGE) involving the
basal anteroseptal and basal inferoseptal segments, and the mid to
apical anterior, anteroseptal, inferoseptal, and inferior segments.
The epicardial mid-inferolateral wall also appears involved.

Measurements:

LVEDV 180 mL

LVSV 47 mL

LVEF 26%

RVEDV 97 mL

RVSV 42 mL
RVEF 43%
IMPRESSION: 1. Normal LV size with mild LVH. EF 26% with wall motion
abnormalities as noted above.

2.  Normal RV size with mildly decreased systolic function, EF 43%.

3. The delayed enhancement pattern is difficult. It could be
suggestive of a very large, wrap-around LAD infarction with
extensive no-reflow affecting the subendocardium. However, the
mid-wall and subepicardial location of the LGE looks more like a
viral myocarditis-type picture to me. If it is indeed LAD infarction
with extensive no-reflow, the affected segments would have >50% wall
thickness no-reflow + LGE, so would be unlikely to improve with
revascularization. I am going to discuss with colleagues.

MEKOVEC

## 2020-08-02 MED ORDER — GADOBUTROL 1 MMOL/ML IV SOLN
12.0000 mL | Freq: Once | INTRAVENOUS | Status: AC | PRN
  Administered 2020-08-02: 11 mL via INTRAVENOUS

## 2020-08-03 NOTE — Progress Notes (Signed)
[facility] NEW PATIENT NOTE    HPI:  Jason Floyd is 54 year old with a PMH of severe systolic heart failure due to presumed NICM (no cardiac cath), DM2, former polysubstance abuse, S/P discectomy and spinal fusion 2/-/2014, and former tobacco ( quit 2014). He has had recovery of LV function for several years.   Echo 2008 EF 15% in setting of polysubstance abuse.   ECHO 12/2007 EF 55-60%  Echo 10/20 EF 55-60% no RWMA.   On 06/07/20 had TIA-like symptoms with mild aphasia and weakness (dropped a can with left hand). Went to PCP had carotid u/s and echo. Carotid u/s 06/22/20 1-39% bilaterally. Echo 8/21 EF down to 25-30%. Brain MRI/MRA. Normal vasculature.  Left corona radiata diffusion-weighted/FLAIR hyperintensity is nonspecific. Differential includes active demyelinating lesionversus subacute insult.  Underwent R/L cath 07/01/20 LAD 100% prox LCX 20% RCA 10%  EF 25-30% Ao = 112/72 (90) LV = 111/27 RA = 2 RV = 46/7 PA = 44/12 (27) PCW = 16 Fick cardiac output/index = 6.8/2.9 PVR = 1.5 WU SVR 1047 Ao sat = 96% PA sat = 70%, 78% SVC sat = 74%  After cath underwent cMRI.for viability  - LVEF 26% - LGE suggestive of a very large, wrap-around LAD infarction with extensive no-reflow affecting the subendocardium.   Feels better. Getting around well. No CP, SOB or edema.  Occasionaly dizzy when standing   Echo today EF 35-40% RV ok      Past Medical History:  Diagnosis Date  . Allergy   . Arthritis   . CHF (congestive heart failure) (HCC)   . Diabetes mellitus without complication (HCC)   . Drug abuse (HCC)   . Eczema   . Gout   . Hyperlipidemia   . Nephrolithiasis   . PE (pulmonary embolism)     Current Outpatient Medications  Medication Sig Dispense Refill  . allopurinol (ZYLOPRIM) 300 MG tablet TAKE 2 TABLETS BY MOUTH DAILY. (Patient taking differently: Take 600 mg by mouth daily. ) 60 tablet 6  . aspirin EC 81 MG tablet Take 81 mg by mouth daily.  Swallow whole.    Marland Kitchen atorvastatin (LIPITOR) 20 MG tablet TAKE 1 TABLET BY MOUTH DAILY. (Patient taking differently: Take 20 mg by mouth daily. ) 30 tablet 6  . calcium carbonate (TUMS - DOSED IN MG ELEMENTAL CALCIUM) 500 MG chewable tablet Chew 2-3 tablets by mouth daily as needed for indigestion or heartburn.    . carvedilol (COREG) 12.5 MG tablet Take 1 tablet (12.5 mg total) by mouth 2 (two) times daily with a meal. 180 tablet 1  . cetirizine (ZYRTEC) 10 MG tablet Take 10 mg by mouth daily.    . diclofenac Sodium (VOLTAREN) 1 % GEL Apply 1 application topically 4 (four) times daily as needed (pain).     Marland Kitchen FARXIGA 5 MG TABS tablet TAKE 1 TABLET BY MOUTH DAILY 30 tablet 6  . fenofibrate 160 MG tablet TAKE 1 TABLET BY MOUTH DAILY. (Patient taking differently: Take 160 mg by mouth daily. ) 90 tablet 1  . meloxicam (MOBIC) 15 MG tablet Take 1 tablet (15 mg total) by mouth daily. 30 tablet 3  . metFORMIN (GLUCOPHAGE) 1000 MG tablet TAKE 1 TABLET BY MOUTH 2 TIMES DAILY WITH A MEAL. (Patient taking differently: Take 1,000 mg by mouth 2 (two) times daily. ) 180 tablet 0  . methocarbamol (ROBAXIN) 500 MG tablet Take 1 tablet (500 mg total) by mouth at bedtime. 90 tablet 1  .  Naphazoline HCl (CLEAR EYES OP) Place 1 drop into both eyes daily as needed (sore eyes).    . pantoprazole (PROTONIX) 40 MG tablet TAKE 1 TABLET BY MOUTH DAILY. 30 tablet 3  . sacubitril-valsartan (ENTRESTO) 97-103 MG Take 1 tablet by mouth 2 (two) times daily. 60 tablet 6  . sodium chloride (OCEAN) 0.65 % SOLN nasal spray Place 1 spray into both nostrils as needed (dryness).    . traZODone (DESYREL) 100 MG tablet TAKE 1 TABLET BY MOUTH AT BEDTIME. (Patient taking differently: Take 100 mg by mouth at bedtime. ) 30 tablet 6  . triamcinolone (KENALOG) 0.025 % ointment APPLY TO THE AFFECTED AREA 2 TIMES DAILY. (Patient not taking: Reported on 07/28/2020) 80 g 1   No current facility-administered medications for this encounter.    No  Known Allergies    Social History   Socioeconomic History  . Marital status: Married    Spouse name: [name]  . Number of children: Not on file  . Years of education: Not on file  . Highest education level: Bachelor's degree (e.g., BA, AB, BS)  Occupational History  . Not on file  Tobacco Use  . Smoking status: Current Some Day Smoker    Packs/day: 0.25    Years: 31.00    Pack years: 7.75    Types: Cigarettes  . Smokeless tobacco: Never Used  . Tobacco comment: 07/28/20 trying to quit  Vaping Use  . Vaping Use: Never used  Substance and Sexual Activity  . Alcohol use: Not Currently    Alcohol/week: 0.0 standard drinks    Comment: occasionally  . Drug use: Not Currently    Types: Marijuana  . Sexual activity: Not on file  Other Topics Concern  . Not on file  Social History Narrative   Lives with wife   Caffeine- coffee 4 c daily   Social Determinants of Health   Financial Resource Strain:   . Difficulty of Paying Living Expenses: Not on file  Food Insecurity:   . Worried About Programme researcher, broadcasting/film/video in the Last Year: Not on file  . Ran Out of Food in the Last Year: Not on file  Transportation Needs:   . Lack of Transportation (Medical): Not on file  . Lack of Transportation (Non-Medical): Not on file  Physical Activity:   . Days of Exercise per Week: Not on file  . Minutes of Exercise per Session: Not on file  Stress:   . Feeling of Stress : Not on file  Social Connections:   . Frequency of Communication with Friends and Family: Not on file  . Frequency of Social Gatherings with Friends and Family: Not on file  . Attends Religious Services: Not on file  . Active Member of Clubs or Organizations: Not on file  . Attends Banker Meetings: Not on file  . Marital Status: Not on file  Intimate Partner Violence:   . Fear of Current or Ex-Partner: Not on file  . Emotionally Abused: Not on file  . Physically Abused: Not on file  . Sexually Abused: Not on  file      Family History  Problem Relation Age of Onset  . Dementia Mother        frontal-temporal  . Stroke Mother   . Breast cancer Maternal Aunt   . Other Brother        overdose  . Colon cancer Neg Hx     Vitals:   08/04/20 0931  BP: 90/62  Pulse: Marland Kitchen)  59  SpO2: 98%  Weight: 103.3 kg (227 lb 12.8 oz)  Height: 6\' 2"  (1.88 m)    PHYSICAL EXAM: General:  Well appearing. No resp difficulty HEENT: normal Neck: supple. no JVD. Carotids 2+ bilat; no bruits. No lymphadenopathy or thryomegaly appreciated. Cor: PMI nondisplaced. Regular rate & rhythm. No rubs, gallops or murmurs. Lungs: clear Abdomen: soft, nontender, nondistended. No hepatosplenomegaly. No bruits or masses. Good bowel sounds. Extremities: no cyanosis, clubbing, rash, edema Neuro: alert & orientedx3, cranial nerves grossly intact. moves all 4 extremities w/o difficulty. Affect pleasant   ASSESSMENT & PLAN:  1. Recurrent systolic HF due to iCM - Echo EF 15% in setting of polysubstance abuse.  - ECHO 12/2007 EF 55-60% - Echo 10/20 EF 55-60% no RWMA.  - Echo 8/21 EF 25-30% - Cath 8/21 LAD 100% LCx 20% RCA 10% - cMRI 08/02/20 EF 26% Large anterior infarct - NYHA II. Volume status ok. Perhaps a little low  - Continue carvedilol 12.5 bid - Continue Entresto 97/103. BP low. Will cut back to 49/51 bid - Continue Farxiga. - No spiro yet with low BP   2. CAD - cath as above - no s/s ischemia - continue ASA, statin. Increase atorva to 80 - repeat cMRI 2 months to assess for scar vs recover   4. Tobacco use - working on it.  counseled on need for cessation  5, DM2 - continue SGLT2i, metformin  - followed by PCP  6.. Possible TIA - carotid dopplers ok - Brain MRI/MRA. Vasculature ok. Findings suspicious for cardio-embolism. Need to d/w Neuro regarding need for [facility]  [facility], MD  10:03 PM

## 2020-08-04 ENCOUNTER — Ambulatory Visit (HOSPITAL_COMMUNITY)
Admission: RE | Admit: 2020-08-04 | Discharge: 2020-08-04 | Disposition: A | Discharge status: Home / Self Care | Payer: BC Managed Care – PPO | Source: Ambulatory Visit | Attending: Internal Medicine | Admitting: Internal Medicine

## 2020-08-04 ENCOUNTER — Other Ambulatory Visit: Payer: Self-pay

## 2020-08-04 ENCOUNTER — Ambulatory Visit (HOSPITAL_BASED_OUTPATIENT_CLINIC_OR_DEPARTMENT_OTHER)
Admission: RE | Admit: 2020-08-04 | Discharge: 2020-08-04 | Disposition: A | Discharge status: Home / Self Care | Payer: BC Managed Care – PPO | Source: Ambulatory Visit | Attending: Internal Medicine | Admitting: Internal Medicine

## 2020-08-04 ENCOUNTER — Other Ambulatory Visit (HOSPITAL_COMMUNITY): Payer: BC Managed Care – PPO

## 2020-08-04 ENCOUNTER — Encounter (HOSPITAL_COMMUNITY): Payer: Self-pay | Admitting: Internal Medicine

## 2020-08-04 VITALS — BP 90/62 | HR 59 | Ht 74.0 in | Wt 227.8 lb

## 2020-08-04 DIAGNOSIS — E1129 Type 2 diabetes mellitus with other diabetic kidney complication: Secondary | ICD-10-CM | POA: Diagnosis not present

## 2020-08-04 DIAGNOSIS — E785 Hyperlipidemia, unspecified: Secondary | ICD-10-CM | POA: Diagnosis not present

## 2020-08-04 DIAGNOSIS — Z7982 Long term (current) use of aspirin: Secondary | ICD-10-CM | POA: Insufficient documentation

## 2020-08-04 DIAGNOSIS — F17210 Nicotine dependence, cigarettes, uncomplicated: Secondary | ICD-10-CM | POA: Insufficient documentation

## 2020-08-04 DIAGNOSIS — I2510 Atherosclerotic heart disease of native coronary artery without angina pectoris: Secondary | ICD-10-CM | POA: Insufficient documentation

## 2020-08-04 DIAGNOSIS — Z7984 Long term (current) use of oral hypoglycemic drugs: Secondary | ICD-10-CM | POA: Insufficient documentation

## 2020-08-04 DIAGNOSIS — Z86711 Personal history of pulmonary embolism: Secondary | ICD-10-CM | POA: Insufficient documentation

## 2020-08-04 DIAGNOSIS — I5022 Chronic systolic (congestive) heart failure: Secondary | ICD-10-CM | POA: Diagnosis not present

## 2020-08-04 DIAGNOSIS — Z79899 Other long term (current) drug therapy: Secondary | ICD-10-CM | POA: Insufficient documentation

## 2020-08-04 DIAGNOSIS — Z791 Long term (current) use of non-steroidal anti-inflammatories (NSAID): Secondary | ICD-10-CM | POA: Diagnosis not present

## 2020-08-04 DIAGNOSIS — E119 Type 2 diabetes mellitus without complications: Secondary | ICD-10-CM | POA: Insufficient documentation

## 2020-08-04 DIAGNOSIS — F1910 Other psychoactive substance abuse, uncomplicated: Secondary | ICD-10-CM | POA: Insufficient documentation

## 2020-08-04 DIAGNOSIS — M1990 Unspecified osteoarthritis, unspecified site: Secondary | ICD-10-CM | POA: Insufficient documentation

## 2020-08-04 DIAGNOSIS — M109 Gout, unspecified: Secondary | ICD-10-CM | POA: Diagnosis not present

## 2020-08-04 LAB — CBC
HCT: 49.6 % (ref 39.0–52.0)
Hemoglobin: 16.6 g/dL (ref 13.0–17.0)
MCH: 31.3 pg (ref 26.0–34.0)
MCHC: 33.5 g/dL (ref 30.0–36.0)
MCV: 93.6 fL (ref 80.0–100.0)
Platelets: 264 10*3/uL (ref 150–400)
RBC: 5.3 MIL/uL (ref 4.22–5.81)
RDW: 12.2 % (ref 11.5–15.5)
WBC: 13.8 10*3/uL — ABNORMAL HIGH (ref 4.0–10.5)
nRBC: 0 % (ref 0.0–0.2)

## 2020-08-04 LAB — ECHOCARDIOGRAM COMPLETE
AR max vel: 3.76 cm2
AV Area VTI: 3.28 cm2
AV Area mean vel: 3.18 cm2
AV Mean grad: 3 mmHg
AV Peak grad: 5.4 mmHg
Ao pk vel: 1.16 m/s
Area-P 1/2: 3.77 cm2
Calc EF: 40.5 %
S' Lateral: 4.3 cm
Single Plane A2C EF: 53.2 %
Single Plane A4C EF: 32.7 %

## 2020-08-04 LAB — BASIC METABOLIC PANEL
Anion gap: 12 (ref 5–15)
BUN: 24 mg/dL — ABNORMAL HIGH (ref 6–20)
CO2: 21 mmol/L — ABNORMAL LOW (ref 22–32)
Calcium: 10.2 mg/dL (ref 8.9–10.3)
Chloride: 104 mmol/L (ref 98–111)
Creatinine, Ser: 1.21 mg/dL (ref 0.61–1.24)
GFR calc Af Amer: >60 mL/min (ref >60)
GFR calc non Af Amer: >60 mL/min (ref >60)
Glucose, Bld: 208 mg/dL — ABNORMAL HIGH (ref 70–99)
Potassium: 4.5 mmol/L (ref 3.5–5.1)
Sodium: 137 mmol/L (ref 135–145)

## 2020-08-04 LAB — BRAIN NATRIURETIC PEPTIDE: B Natriuretic Peptide: 183.2 pg/mL — ABNORMAL HIGH (ref 0.0–100.0)

## 2020-08-04 MED ORDER — ENTRESTO 49-51 MG PO TABS: 1.0000 | ORAL_TABLET | Freq: Two times a day (BID) | ORAL | 6 refills | Status: DC

## 2020-08-04 MED ORDER — ATORVASTATIN CALCIUM 80 MG PO TABS
80.0000 mg | ORAL_TABLET | Freq: Every day | ORAL | 6 refills | Status: DC
Start: 2020-08-04 — End: 2021-03-16

## 2020-08-04 NOTE — Addendum Note (Signed)
Encounter addended by: Noralee Space, RN on: 08/04/2020 10:43 AM  Actions taken: Diagnosis association updated, Order list changed, Clinical Note Signed, Charge Capture section accepted

## 2020-08-04 NOTE — Patient Instructions (Signed)
Increase Atorvastatin to 80 mg Daily  Decrease Entresto to 49/51 mg Twice daily   Labs done today, your results will be available in MyChart, we will contact you for abnormal readings.  Your physician recommends that you schedule a follow-up appointment in: 6 weeks  If you have any questions or concerns before your next appointment please send [place] a message through [place] or call our office at [phone].    TO LEAVE A MESSAGE FOR THE NURSE SELECT OPTION 2, PLEASE LEAVE A MESSAGE INCLUDING: . YOUR NAME . DATE OF BIRTH . CALL BACK NUMBER . REASON FOR CALL**this is important as we prioritize the call backs  YOU WILL RECEIVE A CALL BACK THE SAME DAY AS LONG AS YOU CALL BEFORE 4:00 PM  At the [facility], you and your health needs are our priority. As part of our continuing mission to provide you with exceptional heart care, we have created designated Provider Care Teams. These Care Teams include your primary Cardiologist (physician) and Advanced Practice Providers (APPs- Physician Assistants and Nurse Practitioners) who all work together to provide you with the care you need, when you need it.   You may see any of the following providers on your designated Care Team at your next follow up: Marland Kitchen Dr Arvilla Meres . Dr Marca Ancona . Tonye Becket, NP . Robbie Lis, PA . Karle Plumber, PharmD   Please be sure to bring in all your medications bottles to every appointment.

## 2020-08-04 NOTE — Progress Notes (Signed)
  Echocardiogram 2D Echocardiogram has been performed.  Jason Floyd 08/04/2020, 8:49 AM

## 2020-08-05 ENCOUNTER — Ambulatory Visit
Admission: EM | Admit: 2020-08-05 | Discharge: 2020-08-05 | Disposition: A | Discharge status: Home / Self Care | Payer: BC Managed Care – PPO | Attending: Emergency Medicine | Admitting: Emergency Medicine

## 2020-08-05 ENCOUNTER — Ambulatory Visit: Payer: Self-pay

## 2020-08-05 ENCOUNTER — Other Ambulatory Visit: Payer: Self-pay

## 2020-08-05 ENCOUNTER — Encounter: Payer: Self-pay | Admitting: Family Medicine

## 2020-08-05 DIAGNOSIS — J039 Acute tonsillitis, unspecified: Secondary | ICD-10-CM | POA: Insufficient documentation

## 2020-08-05 DIAGNOSIS — J029 Acute pharyngitis, unspecified: Secondary | ICD-10-CM | POA: Diagnosis not present

## 2020-08-05 HISTORY — DX: Acute myocardial infarction, unspecified: I21.9

## 2020-08-05 LAB — POCT RAPID STREP A (OFFICE): Rapid Strep A Screen: NEGATIVE

## 2020-08-05 MED ORDER — CLINDAMYCIN HCL 150 MG PO CAPS: 150.0000 mg | ORAL_CAPSULE | Freq: Three times a day (TID) | ORAL | 0 refills | Status: AC

## 2020-08-05 MED ORDER — CLINDAMYCIN HCL 300 MG PO CAPS: 300.0000 mg | ORAL_CAPSULE | Freq: Three times a day (TID) | ORAL | 0 refills | Status: AC

## 2020-08-05 MED ORDER — DEXAMETHASONE SODIUM PHOSPHATE 10 MG/ML IJ SOLN
10.0000 mg | Freq: Once | INTRAMUSCULAR | Status: AC
  Administered 2020-08-05: 10 mg via INTRAMUSCULAR

## 2020-08-05 NOTE — Discharge Instructions (Addendum)
Clindamycin 3 times daily. Important to follow up with ENT within 1 week. Very important to go to the emergency room if you develop any worsening symptoms, or you develop fever, chest pain, difficulty breathing.

## 2020-08-05 NOTE — ED Provider Notes (Signed)
[facility]    CSN: [id number] Arrival date & time: 08/05/20  1137      History   Chief Complaint Chief Complaint  Patient presents with  . Sore Throat    HPI Jason Floyd is a 54 y.o. male  Subjective:   History was provided by the patient. Jason Floyd is a 54 y.o. male who presents for evaluation of a sore throat. Associated symptoms include enlarged tonsils, sore throat and swollen glands. Onset of symptoms was 5 days ago, gradually worsening since that time.  He is not drinking much. He has not had recent close exposure to someone with proven streptococcal pharyngitis.  No F, CP, SOB, choking. The following portions of the patient's history were reviewed and updated as appropriate: allergies, current medications, past family history, past medical history, past social history, past surgical history and problem list.     Past Medical History:  Diagnosis Date  . Allergy   . Arthritis   . CHF (congestive heart failure) (HCC)   . CHF (congestive heart failure) (HCC)   . Diabetes mellitus without complication (HCC)   . Drug abuse (HCC)   . Eczema   . Gout   . Hyperlipidemia   . MI (myocardial infarction) (HCC)   . Nephrolithiasis   . PE (pulmonary embolism)     Patient Active Problem List   Diagnosis Date Noted  . TIA (transient ischemic attack) 06/17/2020  . GERD (gastroesophageal reflux disease) 08/05/2019  . HTN (hypertension) 04/16/2018  . Insomnia 01/22/2018  . Controlled diabetes mellitus type 2 with complications (HCC) 07/07/2015  . Thoracic back pain 04/19/2014  . History of CHF (congestive heart failure) 02/24/2013  . Degenerative disc disease, cervical 08/21/2012  . Routine general medical examination at a health care facility 04/17/2012  . Hyperlipidemia 04/17/2012  . Neck pain 04/17/2012  . OBESITY 12/07/2009  . FASCIITIS, PLANTAR 03/29/2009  . PULMONARY EMBOLISM, HX OF 11/18/2008  . NEPHROLITHIASIS, HX OF 11/18/2008  . DRUG  ABUSE, HX OF 11/18/2008  . GOUT 03/22/2008  . NUMMULAR ECZEMA 03/22/2008    Past Surgical History:  Procedure Laterality Date  . CERVICAL FUSION    . RIGHT/LEFT HEART CATH AND CORONARY ANGIOGRAPHY N/A 07/01/2020   Procedure: RIGHT/LEFT HEART CATH AND CORONARY ANGIOGRAPHY;  Surgeon: Dolores Patty, MD;  Location: [facility];  Service: Cardiovascular;  Laterality: N/A;  . SHOULDER ARTHROSCOPY Left   . WISDOM TOOTH EXTRACTION         Home Medications    Prior to Admission medications   Medication Sig Start Date End Date Taking? Authorizing Provider  allopurinol (ZYLOPRIM) 300 MG tablet TAKE 2 TABLETS BY MOUTH DAILY. Patient taking differently: Take 600 mg by mouth daily.  04/13/20   Sheliah Hatch, MD  aspirin EC 81 MG tablet Take 81 mg by mouth daily. Swallow whole.    [provider]  atorvastatin (LIPITOR) 80 MG tablet Take 1 tablet (80 mg total) by mouth daily. 08/04/20   Bensimhon, Bevelyn Buckles, MD  calcium carbonate (TUMS - DOSED IN MG ELEMENTAL CALCIUM) 500 MG chewable tablet Chew 2-3 tablets by mouth daily as needed for indigestion or heartburn.    [provider]  carvedilol (COREG) 12.5 MG tablet Take 1 tablet (12.5 mg total) by mouth 2 (two) times daily with a meal. 06/10/20   Sheliah Hatch, MD  cetirizine (ZYRTEC) 10 MG tablet Take 10 mg by mouth daily.    [provider]  clindamycin (CLEOCIN) 150 MG  capsule Take 1 capsule (150 mg total) by mouth 3 (three) times daily for 7 days. 08/05/20 08/12/20  Hall-Potvin, [place], PA-C  clindamycin (CLEOCIN) 300 MG capsule Take 1 capsule (300 mg total) by mouth 3 (three) times daily for 7 days. 08/05/20 08/12/20  Hall-Potvin, [place], PA-C  diclofenac Sodium (VOLTAREN) 1 % GEL Apply 1 application topically 4 (four) times daily as needed (pain).  Patient not taking: Reported on 08/04/2020    [provider]  FARXIGA 5 MG TABS tablet TAKE 1 TABLET BY MOUTH DAILY 07/04/20   Sheliah Hatch, MD  fenofibrate 160 MG tablet TAKE 1 TABLET BY MOUTH DAILY. Patient taking differently: Take 160 mg by mouth daily.  05/13/20   Sheliah Hatch, MD  meloxicam (MOBIC) 15 MG tablet Take 1 tablet (15 mg total) by mouth daily. 06/10/20   Sheliah Hatch, MD  metFORMIN (GLUCOPHAGE) 1000 MG tablet TAKE 1 TABLET BY MOUTH 2 TIMES DAILY WITH A MEAL. Patient taking differently: Take 1,000 mg by mouth 2 (two) times daily.  05/13/20   Sheliah Hatch, MD  methocarbamol (ROBAXIN) 500 MG tablet Take 1 tablet (500 mg total) by mouth at bedtime. 07/04/20   Sheliah Hatch, MD  Naphazoline HCl (CLEAR EYES OP) Place 1 drop into both eyes daily as needed (sore eyes).    [provider]  pantoprazole (PROTONIX) 40 MG tablet TAKE 1 TABLET BY MOUTH DAILY. 07/04/20   Sheliah Hatch, MD  sacubitril-valsartan (ENTRESTO) 49-51 MG Take 1 tablet by mouth 2 (two) times daily. 08/04/20   Bensimhon, Bevelyn Buckles, MD  sodium chloride (OCEAN) 0.65 % SOLN nasal spray Place 1 spray into both nostrils as needed (dryness).    [provider]  traZODone (DESYREL) 100 MG tablet TAKE 1 TABLET BY MOUTH AT BEDTIME. Patient taking differently: Take 100 mg by mouth at bedtime.  03/15/20   Sheliah Hatch, MD  triamcinolone (KENALOG) 0.025 % ointment APPLY TO THE AFFECTED AREA 2 TIMES DAILY. Patient not taking: Reported on 07/28/2020 01/27/16   Sheliah Hatch, MD    Family History Family History  Problem Relation Age of Onset  . Dementia Mother        frontal-temporal  . Stroke Mother   . Breast cancer Maternal Aunt   . Other Brother        overdose  . Colon cancer Neg Hx     Social History Social History   Tobacco Use  . Smoking status: Former Smoker    Packs/day: 0.25    Years: 31.00    Pack years: 7.75    Types: Cigarettes  . Smokeless tobacco: Never Used  . Tobacco comment: 07/28/20 trying to quit  Vaping Use  . Vaping Use: Never used  Substance Use Topics  . Alcohol use: Not  Currently    Alcohol/week: 0.0 standard drinks    Comment: occasionally  . Drug use: Not Currently    Types: Marijuana     Allergies   Patient has no known allergies.   Review of Systems As per HPI   Physical Exam Triage Vital Signs ED Triage Vitals  Enc Vitals Group     BP      Pulse      Resp      Temp      Temp src      SpO2      Weight      Height      Head Circumference  Peak Flow      Pain Score      Pain Loc      Pain Edu?      Excl. in GC?    No data found.  Updated Vital Signs BP 100/70 (BP Location: Left Arm)   Pulse 95   Temp 97.8 F (36.6 C) (Oral)   Resp 18   SpO2 96%   Visual Acuity Right Eye Distance:   Left Eye Distance:   Bilateral Distance:    Right Eye Near:   Left Eye Near:    Bilateral Near:     Physical Exam Constitutional:      General: He is not in acute distress.    Appearance: He is not toxic-appearing.  HENT:     Head: Normocephalic and atraumatic.     Mouth/Throat:     Tonsils: 1+ on the right. 3+ on the left.  Eyes:     General: No scleral icterus.    Pupils: Pupils are equal, round, and reactive to light.  Neck:     Comments: Trachea midline, negative JVD Cardiovascular:     Rate and Rhythm: Normal rate.  Pulmonary:     Effort: Pulmonary effort is normal. No respiratory distress.     Breath sounds: No wheezing.  Lymphadenopathy:     Cervical: Cervical adenopathy present.  Skin:    Coloration: Skin is not jaundiced or pale.  Neurological:     Mental Status: He is alert and oriented to person, place, and time.      UC Treatments / Results  Labs (all labs ordered are listed, but only abnormal results are displayed) Labs Reviewed  CULTURE, GROUP A STREP [facility])  POCT RAPID STREP A (OFFICE)    EKG   Radiology   Procedures Procedures (including critical care time)  Medications Ordered in UC Medications  dexamethasone (DECADRON) injection 10 mg (10 mg Intramuscular Given 08/05/20 1220)     Initial Impression / Assessment and Plan / UC Course  I have reviewed the triage vital signs and the nursing notes.  Pertinent labs & imaging results that were available during my care of the patient were reviewed by me and considered in my medical decision making (see chart for details).     Patient afebrile, nontoxic, with SpO2 96%.  Rapid strep negative, culture pending.  Will cover for tonsillitis/peritonsilar abscess as below.  F/u w/ ENT.  ER return precautions discussed, patient verbalized understanding and is agreeable to plan. Final Clinical Impressions(s) / UC Diagnoses   Final diagnoses:  Sore throat  Tonsillitis     Discharge Instructions     Clindamycin 3 times daily. Important to follow up with ENT within 1 week. Very important to go to the emergency room if you develop any worsening symptoms, or you develop fever, chest pain, difficulty breathing.    ED Prescriptions    Medication Sig Dispense Auth. Provider   clindamycin (CLEOCIN) 300 MG capsule Take 1 capsule (300 mg total) by mouth 3 (three) times daily for 7 days. 21 capsule Hall-Potvin, [place], PA-C   clindamycin (CLEOCIN) 150 MG capsule Take 1 capsule (150 mg total) by mouth 3 (three) times daily for 7 days. 21 capsule Hall-Potvin, [place], PA-C     PDMP not reviewed this encounter.   Hall-Potvin, [place], [place] 08/05/20 1234

## 2020-08-05 NOTE — ED Triage Notes (Signed)
Pt c/o severe sore throat mainly on lt side since Monday. States now having severe lt ear pain.

## 2020-08-06 ENCOUNTER — Encounter (HOSPITAL_COMMUNITY): Payer: Self-pay

## 2020-08-08 ENCOUNTER — Encounter (HOSPITAL_COMMUNITY): Payer: Self-pay

## 2020-08-08 ENCOUNTER — Other Ambulatory Visit: Payer: Self-pay | Admitting: Family Medicine

## 2020-08-08 LAB — CULTURE, GROUP A STREP (THRC)

## 2020-08-08 NOTE — Telephone Encounter (Signed)
Ok. Keep [place] updated. Good luck!

## 2020-08-10 DIAGNOSIS — J36 Peritonsillar abscess: Secondary | ICD-10-CM | POA: Insufficient documentation

## 2020-08-10 DIAGNOSIS — J3489 Other specified disorders of nose and nasal sinuses: Secondary | ICD-10-CM | POA: Insufficient documentation

## 2020-08-16 ENCOUNTER — Encounter (HOSPITAL_COMMUNITY): Payer: Self-pay

## 2020-08-21 ENCOUNTER — Other Ambulatory Visit: Payer: Self-pay | Admitting: Physician Assistant

## 2020-08-21 ENCOUNTER — Telehealth: Payer: Self-pay | Admitting: Internal Medicine

## 2020-08-21 ENCOUNTER — Telehealth: Payer: Self-pay | Admitting: Physician Assistant

## 2020-08-21 NOTE — Telephone Encounter (Signed)
  54 y/o male with h/o systolic HF due to NICM with recovered EF.   In July of this year had syncopal episode/TIA and found to have out-of-hospital anterior MI. EF 25-30%.   Cath with totally occluded LAD. cMRI confirmed subacute anterior-apical infarct with aneurysmal formation.   Has been having palpitations and occasional lightheadedness. Zio AT placed today (just showed up in mail)   This afternoon had palpitations and some lightheadedness.   Zio interrogation with one episodee brief AF and 20 second run of NSVT.  I called him and discussed options  1. Come to ER now for observation 2. Direct admit in am for ICD implant 3. Fit LifeVest tomorrow and wait for ICD  He opts for #2. He is aware of the risk of SCD. We will arrange for direct admit tomorrow for ICD implant.D/w Dr. Graciela Husbands.   Arvilla Meres, MD  4:24 PM

## 2020-08-21 NOTE — Telephone Encounter (Signed)
Paged by Isabel Caprice again for 20 sec of ventricular tachycardia with rate of 219 bpm. Occurred around 2:47PM EST. Patient was symptomatic with dizziness and facial flush. I called Mr. Kronenberger who says his current SBP was in the 130s. We decided to increase his coreg to 18.75mg  BID. He is aware to call 911 if he has any presyncope or syncope. I will check with Dr. [name] regarding this significant rhythm issue to see if he has any further recommendation.

## 2020-08-21 NOTE — Telephone Encounter (Signed)
Paged by Samara Snide [phone]) regarding first documentation of afib, strip has been uploaded for MD to review. HR well controlled in afib between 67-93 bpm. Patient was asymptomatic today, however did have dizziness yesterday.

## 2020-08-21 NOTE — Telephone Encounter (Signed)
Spoke with Dr. [name] regarding the symptomatic VT spell. Dr. [name] is calling the patient to discuss options and also plan to speak with Dr. Graciela Husbands as well. Awaiting further instruction.

## 2020-08-22 ENCOUNTER — Ambulatory Visit (HOSPITAL_COMMUNITY)
Admission: RE | Admit: 2020-08-22 | Discharge: 2020-08-22 | Disposition: A | Discharge status: Home / Self Care | Payer: BC Managed Care – PPO | Source: Ambulatory Visit | Attending: Internal Medicine | Admitting: Internal Medicine

## 2020-08-22 ENCOUNTER — Ambulatory Visit (HOSPITAL_COMMUNITY): Payer: BC Managed Care – PPO

## 2020-08-22 ENCOUNTER — Observation Stay
Admission: AD | Admit: 2020-08-22 | Payer: BC Managed Care – PPO | Source: Ambulatory Visit | Admitting: Internal Medicine

## 2020-08-22 ENCOUNTER — Other Ambulatory Visit (HOSPITAL_COMMUNITY): Payer: Self-pay | Admitting: *Deleted

## 2020-08-22 ENCOUNTER — Encounter (HOSPITAL_COMMUNITY)
Admission: RE | Disposition: A | Discharge status: Home / Self Care | Payer: BC Managed Care – PPO | Source: Ambulatory Visit | Attending: Internal Medicine

## 2020-08-22 ENCOUNTER — Other Ambulatory Visit: Payer: Self-pay

## 2020-08-22 DIAGNOSIS — I252 Old myocardial infarction: Secondary | ICD-10-CM | POA: Diagnosis not present

## 2020-08-22 DIAGNOSIS — Z87891 Personal history of nicotine dependence: Secondary | ICD-10-CM | POA: Insufficient documentation

## 2020-08-22 DIAGNOSIS — I5022 Chronic systolic (congestive) heart failure: Secondary | ICD-10-CM | POA: Diagnosis not present

## 2020-08-22 DIAGNOSIS — E785 Hyperlipidemia, unspecified: Secondary | ICD-10-CM | POA: Insufficient documentation

## 2020-08-22 DIAGNOSIS — M1990 Unspecified osteoarthritis, unspecified site: Secondary | ICD-10-CM | POA: Diagnosis not present

## 2020-08-22 DIAGNOSIS — I472 Ventricular tachycardia: Secondary | ICD-10-CM

## 2020-08-22 DIAGNOSIS — Z79899 Other long term (current) drug therapy: Secondary | ICD-10-CM | POA: Diagnosis not present

## 2020-08-22 DIAGNOSIS — R42 Dizziness and giddiness: Secondary | ICD-10-CM

## 2020-08-22 DIAGNOSIS — Z7982 Long term (current) use of aspirin: Secondary | ICD-10-CM | POA: Diagnosis not present

## 2020-08-22 DIAGNOSIS — Z8673 Personal history of transient ischemic attack (TIA), and cerebral infarction without residual deficits: Secondary | ICD-10-CM | POA: Insufficient documentation

## 2020-08-22 DIAGNOSIS — M109 Gout, unspecified: Secondary | ICD-10-CM | POA: Insufficient documentation

## 2020-08-22 DIAGNOSIS — I255 Ischemic cardiomyopathy: Secondary | ICD-10-CM | POA: Diagnosis not present

## 2020-08-22 DIAGNOSIS — Z86711 Personal history of pulmonary embolism: Secondary | ICD-10-CM | POA: Insufficient documentation

## 2020-08-22 DIAGNOSIS — Z959 Presence of cardiac and vascular implant and graft, unspecified: Secondary | ICD-10-CM

## 2020-08-22 DIAGNOSIS — Z20822 Contact with and (suspected) exposure to covid-19: Secondary | ICD-10-CM | POA: Diagnosis not present

## 2020-08-22 DIAGNOSIS — I2510 Atherosclerotic heart disease of native coronary artery without angina pectoris: Secondary | ICD-10-CM | POA: Insufficient documentation

## 2020-08-22 DIAGNOSIS — Z823 Family history of stroke: Secondary | ICD-10-CM | POA: Insufficient documentation

## 2020-08-22 DIAGNOSIS — E119 Type 2 diabetes mellitus without complications: Secondary | ICD-10-CM | POA: Diagnosis not present

## 2020-08-22 DIAGNOSIS — I4819 Other persistent atrial fibrillation: Secondary | ICD-10-CM | POA: Insufficient documentation

## 2020-08-22 DIAGNOSIS — Z7984 Long term (current) use of oral hypoglycemic drugs: Secondary | ICD-10-CM | POA: Insufficient documentation

## 2020-08-22 DIAGNOSIS — F1911 Other psychoactive substance abuse, in remission: Secondary | ICD-10-CM | POA: Diagnosis not present

## 2020-08-22 DIAGNOSIS — Z006 Encounter for examination for normal comparison and control in clinical research program: Secondary | ICD-10-CM | POA: Insufficient documentation

## 2020-08-22 DIAGNOSIS — I428 Other cardiomyopathies: Secondary | ICD-10-CM | POA: Diagnosis not present

## 2020-08-22 HISTORY — PX: ICD IMPLANT: EP1208

## 2020-08-22 LAB — BASIC METABOLIC PANEL
Anion gap: 8 (ref 5–15)
BUN: 12 mg/dL (ref 6–20)
CO2: 25 mmol/L (ref 22–32)
Calcium: 9.1 mg/dL (ref 8.9–10.3)
Chloride: 108 mmol/L (ref 98–111)
Creatinine, Ser: 1.2 mg/dL (ref 0.61–1.24)
GFR, Estimated: >60 mL/min (ref >60)
Glucose, Bld: 133 mg/dL — ABNORMAL HIGH (ref 70–99)
Potassium: 3.9 mmol/L (ref 3.5–5.1)
Sodium: 141 mmol/L (ref 135–145)

## 2020-08-22 LAB — GLUCOSE, CAPILLARY: Glucose-Capillary: 127 mg/dL — ABNORMAL HIGH (ref 70–99)

## 2020-08-22 LAB — CBC WITH DIFFERENTIAL/PLATELET
Abs Immature Granulocytes: 0.04 10*3/uL (ref 0.00–0.07)
Basophils Absolute: 0 10*3/uL (ref 0.0–0.1)
Basophils Relative: 0 %
Eosinophils Absolute: 0.3 10*3/uL (ref 0.0–0.5)
Eosinophils Relative: 4 %
HCT: 39.8 % (ref 39.0–52.0)
Hemoglobin: 12.9 g/dL — ABNORMAL LOW (ref 13.0–17.0)
Immature Granulocytes: 1 %
Lymphocytes Relative: 29 %
Lymphs Abs: 2 10*3/uL (ref 0.7–4.0)
MCH: 30.2 pg (ref 26.0–34.0)
MCHC: 32.4 g/dL (ref 30.0–36.0)
MCV: 93.2 fL (ref 80.0–100.0)
Monocytes Absolute: 0.4 10*3/uL (ref 0.1–1.0)
Monocytes Relative: 6 %
Neutro Abs: 4.2 10*3/uL (ref 1.7–7.7)
Neutrophils Relative %: 60 %
Platelets: 272 10*3/uL (ref 150–400)
RBC: 4.27 MIL/uL (ref 4.22–5.81)
RDW: 13.5 % (ref 11.5–15.5)
WBC: 7 10*3/uL (ref 4.0–10.5)
nRBC: 0 % (ref 0.0–0.2)

## 2020-08-22 LAB — RESPIRATORY PANEL BY RT PCR (FLU A&B, COVID)
Influenza A by PCR: NEGATIVE
Influenza B by PCR: NEGATIVE
SARS Coronavirus 2 by RT PCR: NEGATIVE

## 2020-08-22 LAB — MAGNESIUM: Magnesium: 1.7 mg/dL (ref 1.7–2.4)

## 2020-08-22 IMAGING — CR DG CHEST 2V
2 series · 2 of 2 positions shown · non-contrast
Comparison: Chest radiographs [DATE] and earlier.

CLINICAL DATA: 53-year-old male status post cardiac AICD placement.

EXAM:
CHEST - 2 VIEW

[w chest pa]
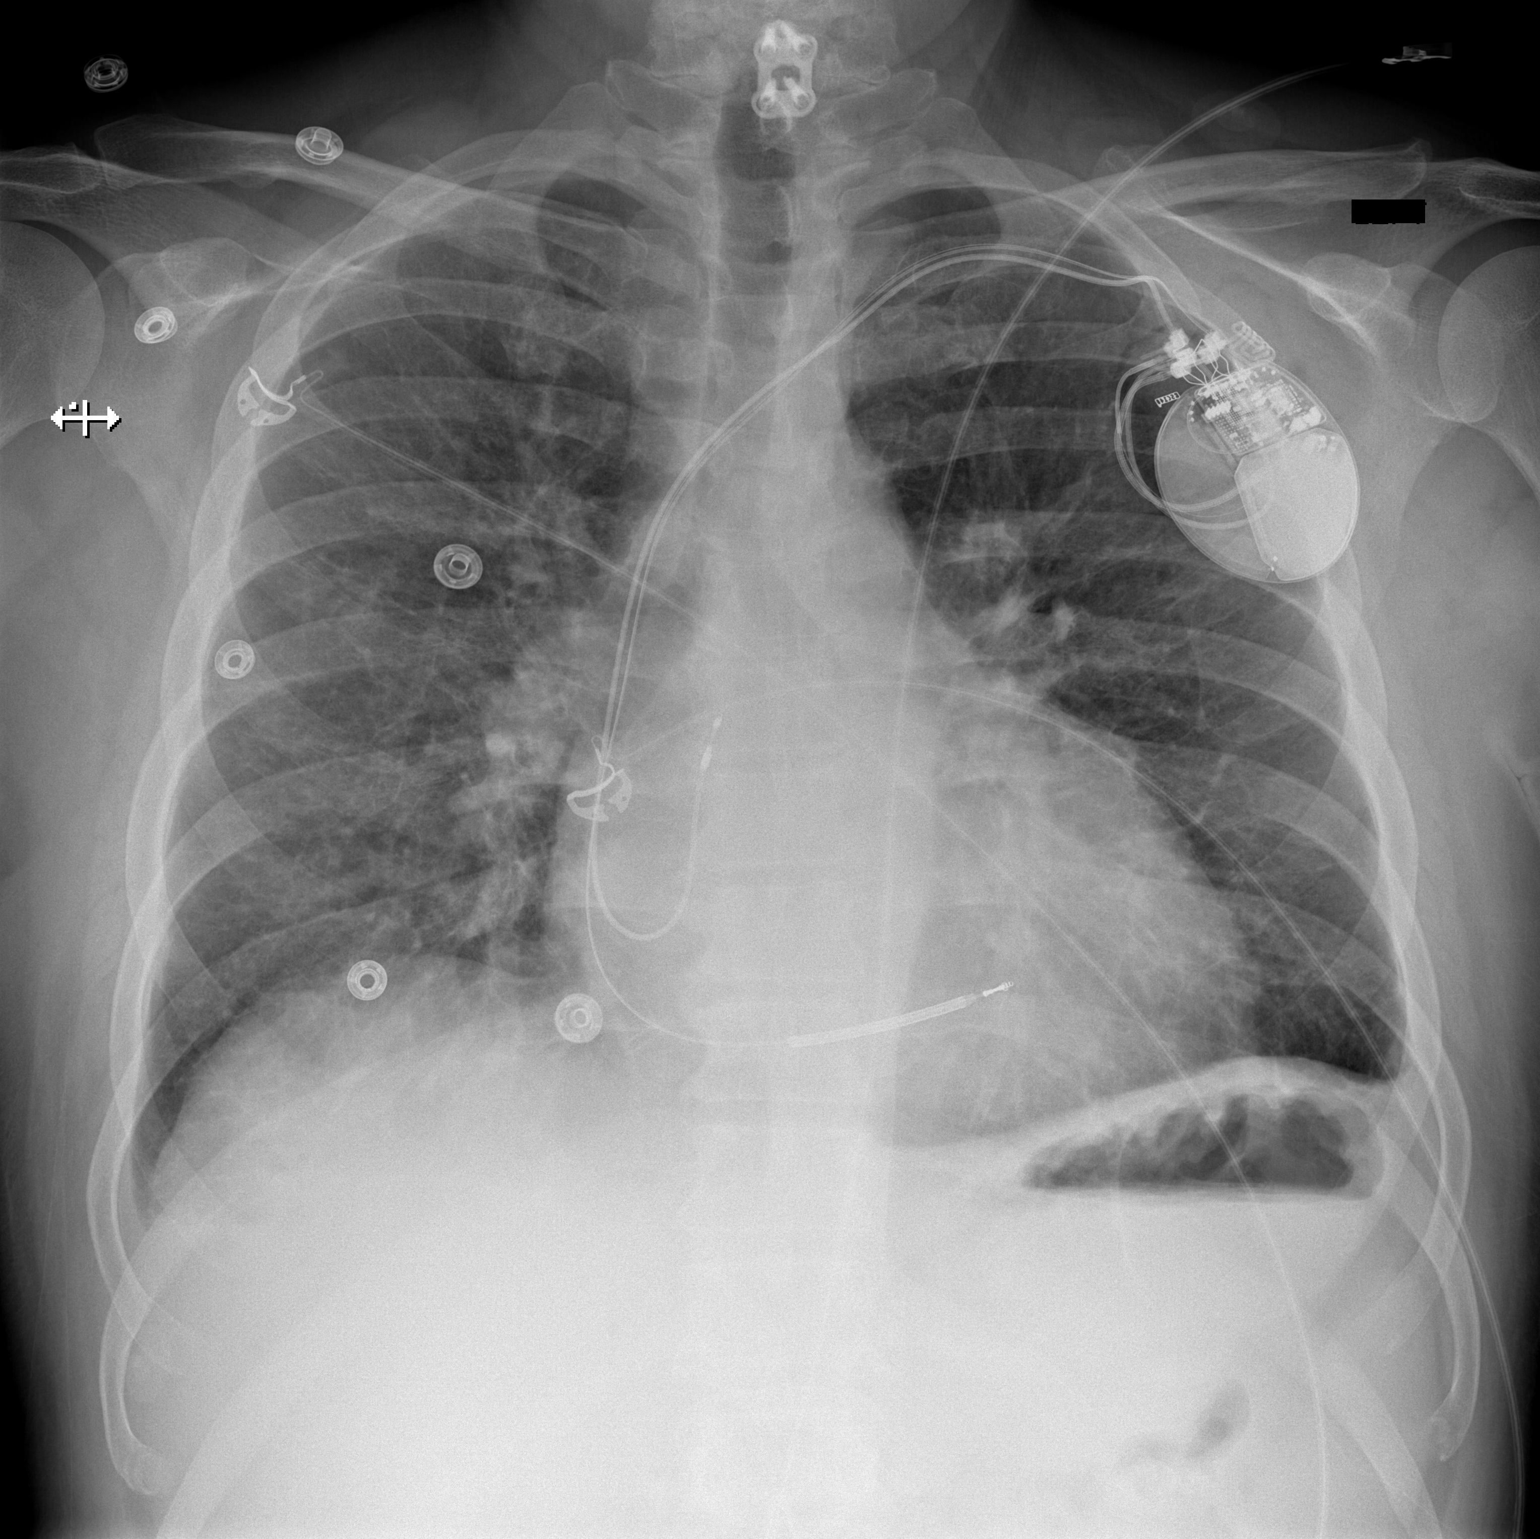

[w chest lat]
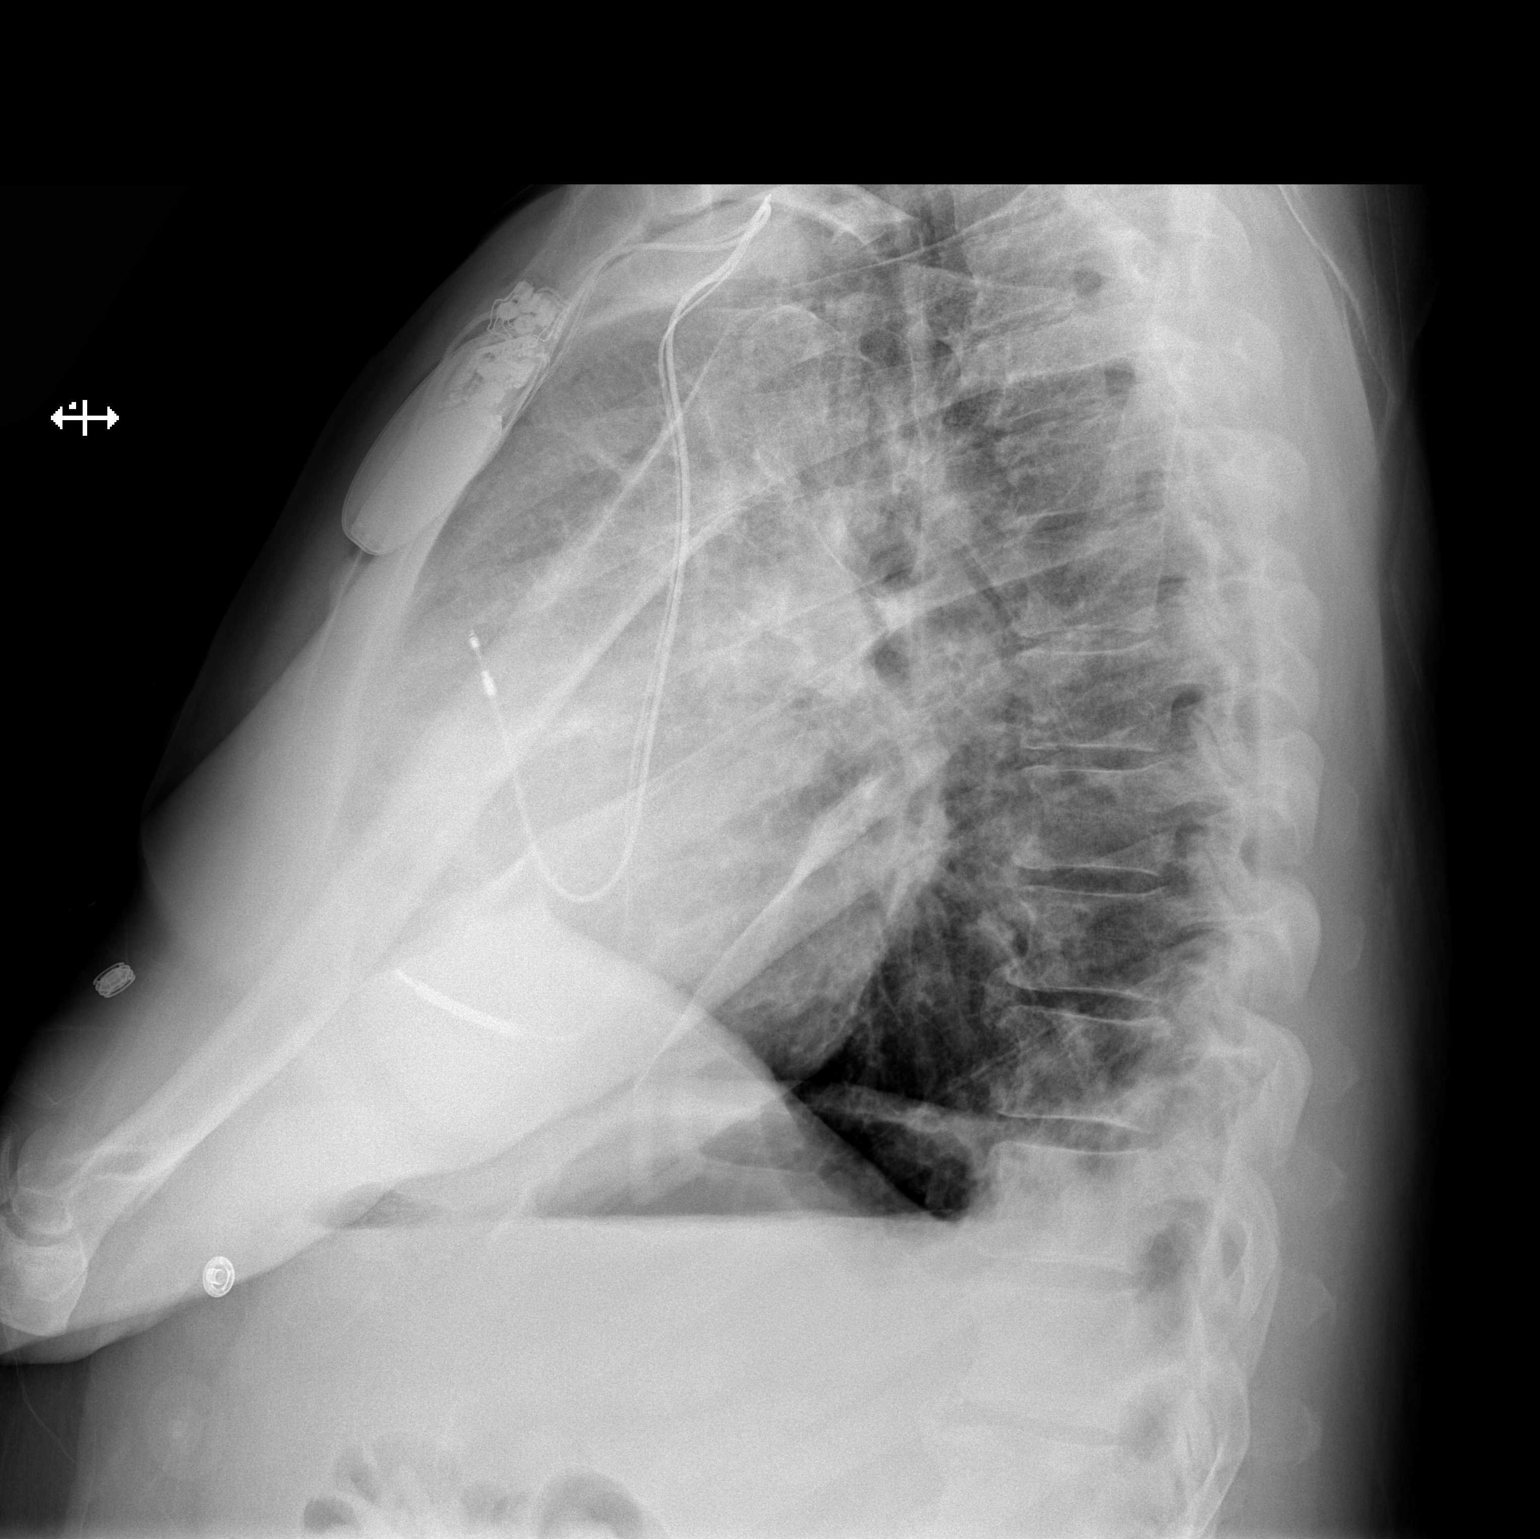

[2 of 2 positions shown; findings below may reference images not displayed]

FINDINGS: Mild cardiomegaly. Other mediastinal contours are within normal
limits. Visualized tracheal air column is within normal limits.

Left side chest AICD with 2 transvenous leads terminating in the
right atrium and RV apex region. No pneumothorax identified.
Pulmonary vascularity appears increased compared to [J9] without
overt edema. Questionable small pleural effusions. No consolidation.

No acute osseous abnormality identified. Lower cervical ACDF may be
new since [J9]. Negative visible bowel gas pattern.
IMPRESSION: 1. Left chest AICD placed with no adverse features.
2. Mild cardiomegaly. Pulmonary vascular congestion without overt
edema. Questionable small effusions.

## 2020-08-22 SURGERY — ICD IMPLANT

## 2020-08-22 MED ORDER — CHLORHEXIDINE GLUCONATE 4 % EX LIQD
4.0000 "application " | Freq: Once | CUTANEOUS | Status: DC
  Filled 2020-08-22: qty 60

## 2020-08-22 MED ORDER — CEFAZOLIN SODIUM-DEXTROSE 2-4 GM/100ML-% IV SOLN
2.0000 g | INTRAVENOUS | Status: AC
  Administered 2020-08-22: 2 g via INTRAVENOUS
  Filled 2020-08-22: qty 100

## 2020-08-22 MED ORDER — FENTANYL CITRATE (PF) 100 MCG/2ML IJ SOLN
INTRAMUSCULAR | Status: DC | PRN
Start: 2020-08-22 — End: 2020-08-22
  Administered 2020-08-22: 50 ug via INTRAVENOUS
  Administered 2020-08-22: 25 ug via INTRAVENOUS

## 2020-08-22 MED ORDER — AMIODARONE HCL 200 MG PO TABS
200.0000 mg | ORAL_TABLET | Freq: Once | ORAL | Status: AC
  Administered 2020-08-22: 200 mg via ORAL
  Filled 2020-08-22: qty 1

## 2020-08-22 MED ORDER — HEPARIN (PORCINE) IN NACL 1000-0.9 UT/500ML-% IV SOLN
INTRAVENOUS | Status: DC | PRN
  Administered 2020-08-22: 500 mL

## 2020-08-22 MED ORDER — ACETAMINOPHEN 325 MG PO TABS
325.0000 mg | ORAL_TABLET | ORAL | Status: DC | PRN
  Administered 2020-08-22: 650 mg via ORAL
  Filled 2020-08-22 (×3): qty 2

## 2020-08-22 MED ORDER — SODIUM CHLORIDE 0.9 % IV SOLN
80.0000 mg | INTRAVENOUS | Status: AC
  Administered 2020-08-22: 80 mg
  Filled 2020-08-22: qty 2

## 2020-08-22 MED ORDER — SODIUM CHLORIDE 0.9 % IV SOLN: INTRAVENOUS | Status: DC

## 2020-08-22 MED ORDER — AMIODARONE HCL 200 MG PO TABS: 200.0000 mg | ORAL_TABLET | Freq: Every day | ORAL | 2 refills | Status: DC

## 2020-08-22 MED ORDER — LIDOCAINE HCL (PF) 1 % IJ SOLN
INTRAMUSCULAR | Status: AC
  Filled 2020-08-22: qty 60

## 2020-08-22 MED ORDER — FUROSEMIDE 10 MG/ML IJ SOLN
INTRAMUSCULAR | Status: AC
  Filled 2020-08-22: qty 8

## 2020-08-22 MED ORDER — SODIUM CHLORIDE 0.9 % IV SOLN
INTRAVENOUS | Status: AC
  Filled 2020-08-22: qty 2

## 2020-08-22 MED ORDER — ONDANSETRON HCL 4 MG/2ML IJ SOLN: 4.0000 mg | Freq: Four times a day (QID) | INTRAMUSCULAR | Status: DC | PRN

## 2020-08-22 MED ORDER — FUROSEMIDE 10 MG/ML IJ SOLN
INTRAMUSCULAR | Status: DC | PRN
  Administered 2020-08-22: 80 mg via INTRAVENOUS

## 2020-08-22 MED ORDER — FUROSEMIDE 40 MG PO TABS: 40.0000 mg | ORAL_TABLET | Freq: Every day | ORAL | 6 refills | Status: DC

## 2020-08-22 MED ORDER — LIDOCAINE HCL (PF) 1 % IJ SOLN
INTRAMUSCULAR | Status: DC | PRN
  Administered 2020-08-22: 50 mL

## 2020-08-22 MED ORDER — CEFAZOLIN SODIUM-DEXTROSE 2-4 GM/100ML-% IV SOLN
INTRAVENOUS | Status: AC
  Filled 2020-08-22: qty 100

## 2020-08-22 MED ORDER — HEPARIN (PORCINE) IN NACL 1000-0.9 UT/500ML-% IV SOLN
INTRAVENOUS | Status: AC
  Filled 2020-08-22: qty 500

## 2020-08-22 MED ORDER — MIDAZOLAM HCL 5 MG/5ML IJ SOLN
INTRAMUSCULAR | Status: DC | PRN
  Administered 2020-08-22: 2 mg via INTRAVENOUS
  Administered 2020-08-22: 1 mg via INTRAVENOUS

## 2020-08-22 MED ORDER — AMIODARONE HCL 200 MG PO TABS: 200.0000 mg | ORAL_TABLET | Freq: Two times a day (BID) | ORAL | 0 refills | Status: DC

## 2020-08-22 MED ORDER — FENTANYL CITRATE (PF) 100 MCG/2ML IJ SOLN
INTRAMUSCULAR | Status: AC
  Filled 2020-08-22: qty 2

## 2020-08-22 MED ORDER — MIDAZOLAM HCL 5 MG/5ML IJ SOLN
INTRAMUSCULAR | Status: AC
  Filled 2020-08-22: qty 5

## 2020-08-22 MED ORDER — APIXABAN 5 MG PO TABS: 5.0000 mg | ORAL_TABLET | Freq: Two times a day (BID) | ORAL | 6 refills | Status: DC

## 2020-08-22 MED ORDER — POTASSIUM CHLORIDE ER 20 MEQ PO TBCR: 20.0000 meq | EXTENDED_RELEASE_TABLET | Freq: Every day | ORAL | 6 refills | Status: DC

## 2020-08-22 SURGICAL SUPPLY — 9 items
CABLE SURGICAL S-101-97-12 (CABLE) ×3 IMPLANT
HEMOSTAT SURGICEL 2X4 FIBR (HEMOSTASIS) ×1 IMPLANT
ICD MOMENTUM D121 (ICD Generator) ×1 IMPLANT
LEAD INGEVITY 7841 52 (Lead) ×1 IMPLANT
LEAD RELIANCE 0138-64 (Lead) ×1 IMPLANT
PAD PRO RADIOLUCENT 2001M-C (PAD) ×2 IMPLANT
SHEATH 7FR PRELUDE SNAP 13 (SHEATH) ×1 IMPLANT
SHEATH 9.5FR PRELUDE SNAP 13 (SHEATH) ×1 IMPLANT
TRAY PACEMAKER INSERTION (PACKS) ×2 IMPLANT

## 2020-08-22 NOTE — H&P (Addendum)
Cardiology Admission History and Physical:   Patient ID: Devyon Keator MRN: [id number]; DOB: 1966-11-03   Admission date: (Not on file)  Primary Care Provider: Sheliah Hatch, MD [facility] Cardiologist: Dr. Lenoria [facility] Electrophysiologist:    Chief Complaint:  Symptomatic NSVT  Patient Profile:   Mortimer Bair is a 54 y.o. male with PMHx of DM, chronic back pain, former smoker, former poly-substance abuse, HLDhistorically a NICM (2/2 polysubstance abuse) that had recovered LV function more recently found to have had a silent MI and CAD with recurrent reduction in EF  History of Present Illness:   Mr. Esson On 06/07/20 had TIA-like symptoms with mild aphasia and weakness (dropped a can with left hand). Went to PCP had carotid u/s and echo. Carotid u/s 06/22/20 1-39% bilaterally. Echo 8/21 EF down to 25-30%. Brain MRI/MRA. Normal vasculature.  Left corona radiata diffusion-weighted/FLAIR hyperintensity is nonspecific. Differential includes active demyelinating lesionversus subacute insult.  Underwent R/L cath 07/01/20 LAD 100% prox LCX 20% RCA 10%  EF 25-30% Ao = 112/72 (90) LV = 111/27 RA = 2 RV = 46/7 PA = 44/12 (27) PCW = 16 Fick cardiac output/index = 6.8/2.9 PVR = 1.5 WU SVR 1047 Ao sat = 96% PA sat = 70%, 78% SVC sat = 74%  After cath underwent cMRI.for viability  - LVEF 26% - LGE suggestive of a very large, wrap-around LAD infarction with extensive no-reflow affecting the subendocardium.   He has called with some dizzy/near syncopal spells and was placed on Zio AT monitor, was observed to have a few NSVT episodes, 18, 21 beats and a longer episode of 20-22 seconds >200bom, MMVT Pt was called, and in d/w Dr. [name] preferred to come electively for admission/ICD implant  Last EKG, labs are reviewed 08/04/20 K+ 4.5 BUN/Creat 24/1.21 WBC 13.8 H/H 16/49 Plts 264  EKG 06/30/20 was SR 72bpm, borderline 1st degree AVBlock , QRS   He denies any CP, no rest SOB, no symptoms of orthopnea or PND.  He does feel a bit bloated the last few days with some mild DOE and thinks he may be retaining some fluid.  He reports Dr. [name] discussed in detail with him yesterday the findings on his monitor, what that meant and role for ICD  Past Medical History:  Diagnosis Date  . Allergy   . Arthritis   . CHF (congestive heart failure) (HCC)   . CHF (congestive heart failure) (HCC)   . Diabetes mellitus without complication (HCC)   . Drug abuse (HCC)   . Eczema   . Gout   . Hyperlipidemia   . MI (myocardial infarction) (HCC)   . Nephrolithiasis   . PE (pulmonary embolism)     Past Surgical History:  Procedure Laterality Date  . CERVICAL FUSION    . RIGHT/LEFT HEART CATH AND CORONARY ANGIOGRAPHY N/A 07/01/2020   Procedure: RIGHT/LEFT HEART CATH AND CORONARY ANGIOGRAPHY;  Surgeon: Dolores Patty, MD;  Location: [facility] INVASIVE CV LAB;  Service: Cardiovascular;  Laterality: N/A;  . SHOULDER ARTHROSCOPY Left   . WISDOM TOOTH EXTRACTION       Medications Prior to Admission: Prior to Admission medications   Medication Sig Start Date End Date Taking? Authorizing Provider  allopurinol (ZYLOPRIM) 300 MG tablet TAKE 2 TABLETS BY MOUTH DAILY. Patient taking differently: Take 600 mg by mouth daily.  04/13/20   Sheliah Hatch, MD  aspirin EC 81 MG tablet Take 81 mg by mouth daily. Swallow whole.    [provider]  atorvastatin (LIPITOR) 80 MG tablet Take 1 tablet (80 mg total) by mouth daily. 08/04/20   Bensimhon, Bevelyn Buckles, MD  calcium carbonate (TUMS - DOSED IN MG ELEMENTAL CALCIUM) 500 MG chewable tablet Chew 2-3 tablets by mouth daily as needed for indigestion or heartburn.    [provider]  carvedilol (COREG) 12.5 MG tablet Take 1 tablet (12.5 mg total) by mouth 2 (two) times daily with a meal. 06/10/20   Sheliah Hatch, MD  cetirizine (ZYRTEC) 10 MG tablet Take 10 mg by mouth daily.     [provider]  diclofenac Sodium (VOLTAREN) 1 % GEL Apply 1 application topically 4 (four) times daily as needed (pain).  Patient not taking: Reported on 08/04/2020    [provider]  FARXIGA 5 MG TABS tablet TAKE 1 TABLET BY MOUTH DAILY 07/04/20   Sheliah Hatch, MD  fenofibrate 160 MG tablet TAKE 1 TABLET BY MOUTH DAILY. Patient taking differently: Take 160 mg by mouth daily.  05/13/20   Sheliah Hatch, MD  meloxicam (MOBIC) 15 MG tablet Take 1 tablet (15 mg total) by mouth daily. 06/10/20   Sheliah Hatch, MD  metFORMIN (GLUCOPHAGE) 1000 MG tablet TAKE 1 TABLET BY MOUTH 2 TIMES DAILY WITH A MEAL. 08/08/20   Sheliah Hatch, MD  methocarbamol (ROBAXIN) 500 MG tablet Take 1 tablet (500 mg total) by mouth at bedtime. 07/04/20   Sheliah Hatch, MD  Naphazoline HCl (CLEAR EYES OP) Place 1 drop into both eyes daily as needed (sore eyes).    [provider]  pantoprazole (PROTONIX) 40 MG tablet TAKE 1 TABLET BY MOUTH DAILY. 07/04/20   Sheliah Hatch, MD  sacubitril-valsartan (ENTRESTO) 49-51 MG Take 1 tablet by mouth 2 (two) times daily. 08/04/20   Bensimhon, Bevelyn Buckles, MD  sodium chloride (OCEAN) 0.65 % SOLN nasal spray Place 1 spray into both nostrils as needed (dryness).    [provider]  traZODone (DESYREL) 100 MG tablet TAKE 1 TABLET BY MOUTH AT BEDTIME. Patient taking differently: Take 100 mg by mouth at bedtime.  03/15/20   Sheliah Hatch, MD  triamcinolone (KENALOG) 0.025 % ointment APPLY TO THE AFFECTED AREA 2 TIMES DAILY. Patient not taking: Reported on 07/28/2020 01/27/16   Sheliah Hatch, MD     Allergies:   No Known Allergies  Social History:   Social History   Socioeconomic History  . Marital status: Married    Spouse name: [name]  . Number of children: Not on file  . Years of education: Not on file  . Highest education level: Bachelor's degree (e.g., BA, AB, BS)  Occupational History  . Not on file  Tobacco  Use  . Smoking status: Former Smoker    Packs/day: 0.25    Years: 31.00    Pack years: 7.75    Types: Cigarettes  . Smokeless tobacco: Never Used  . Tobacco comment: 07/28/20 trying to quit  Vaping Use  . Vaping Use: Never used  Substance and Sexual Activity  . Alcohol use: Not Currently    Alcohol/week: 0.0 standard drinks    Comment: occasionally  . Drug use: Not Currently    Types: Marijuana  . Sexual activity: Not on file  Other Topics Concern  . Not on file  Social History Narrative   Lives with wife   Caffeine- coffee 4 c daily   Social Determinants of Health   Financial Resource Strain:   . Difficulty of Paying Living Expenses: Not on  file  Food Insecurity:   . Worried About Programme researcher, broadcasting/film/video in the Last Year: Not on file  . Ran Out of Food in the Last Year: Not on file  Transportation Needs:   . Lack of Transportation (Medical): Not on file  . Lack of Transportation (Non-Medical): Not on file  Physical Activity:   . Days of Exercise per Week: Not on file  . Minutes of Exercise per Session: Not on file  Stress:   . Feeling of Stress : Not on file  Social Connections:   . Frequency of Communication with Friends and Family: Not on file  . Frequency of Social Gatherings with Friends and Family: Not on file  . Attends Religious Services: Not on file  . Active Member of Clubs or Organizations: Not on file  . Attends Banker Meetings: Not on file  . Marital Status: Not on file  Intimate Partner Violence:   . Fear of Current or Ex-Partner: Not on file  . Emotionally Abused: Not on file  . Physically Abused: Not on file  . Sexually Abused: Not on file    Family History:   The patient's family history includes Breast cancer in his maternal aunt; Dementia in his mother; Other in his brother; Stroke in his mother. There is no history of Colon cancer.    ROS:  Please see the history of present illness.  All other ROS reviewed and negative.      Physical Exam/Data:  There were no vitals filed for this visit. No intake or output data in the 24 hours ending 08/22/20 0911 Last 3 Weights 08/04/2020 07/28/2020 07/01/2020  Weight (lbs) 227 lb 12.8 oz 235 lb 6.4 oz 239 lb  Weight (kg) 103.329 kg 106.777 kg 108.41 kg     There is no height or weight on file to calculate BMI.  General:  Well nourished, well developed, in no acute distress HEENT: normal Lymph: no adenopathy Neck: no JVD Endocrine:  No thryomegaly Vascular: No carotid bruits Cardiac:  irreg-irreg; no murmurs, gallops or rubs Lungs:  CTA b/l, no wheezing, rhonchi or rales  Abd: soft, nontender, no hepatomegaly, not distended Ext: no edema Musculoskeletal:  No deformities Skin: warm and dry  Neuro:  CNs 2-12 intact, no focal abnormalities noted Psych:  Normal affect    EKG:  The ECG ordered  Relevant CV Studies:   ZIO monitor strips reviewed 07/04/2020: TTE IMPRESSIONS  1. Left ventricular ejection fraction, by estimation, is 35 to 40%. Left  ventricular ejection fraction by 2D MOD biplane is 40.5 %. The left  ventricle has mild to moderately decreased function. The left ventricle  demonstrates global hypokinesis. There  is moderate concentric left ventricular hypertrophy. Left ventricular  diastolic parameters are consistent with Grade I diastolic dysfunction  (impaired relaxation).  2. Right ventricular systolic function is normal. The right ventricular  size is normal.  3. The mitral valve is normal in structure. No evidence of mitral valve  regurgitation.  4. The aortic valve is tricuspid. Aortic valve regurgitation is not  visualized. No aortic stenosis is present.  5. The inferior vena cava is normal in size with greater than 50%  respiratory variability, suggesting right atrial pressure of 3 mmHg.    08/02/2020; c.MRI IMPRESSION: 1. Normal LV size with mild LVH. EF 26% with wall motion abnormalities as noted above.  2.  Normal RV size with  mildly decreased systolic function, EF 43%.  3. The delayed enhancement pattern is difficult. It  could be suggestive of a very large, wrap-around LAD infarction with extensive no-reflow affecting the subendocardium. However, the mid-wall and subepicardial location of the LGE looks more like a viral myocarditis-type picture to me. If it is indeed LAD infarction with extensive no-reflow, the affected segments would have >50% wall thickness no-reflow + LGE, so would be unlikely to improve with revascularization. I am going to discuss with colleagues.    Prox RCA lesion is 10% stenosed.  Prox LAD to Mid LAD lesion is 100% stenosed.  Prox Cx to Mid Cx lesion is 20% stenosed.   Findings:  Ao = 112/72 (90) LV = 111/27 RA = 2 RV = 46/7 PA = 44/12 (27) PCW = 16 Fick cardiac output/index = 6.8/2.9 PVR = 1.5 WU SVR 1047 Ao sat = 96% PA sat = 70%, 78% SVC sat = 74%  Assessment: 1. 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 Ischemic CM EF 25-30% 3. Mildly elevated filling pressures with normal cardiac output Plan/Discussion:  CMRI. If LAD territory has significant viability consider CTO vs LIMA    Laboratory Data:  High Sensitivity Troponin:  No results for input(s): TROPONINIHS in the last 720 hours.    ChemistryNo results for input(s): NA, K, CL, CO2, GLUCOSE, BUN, CREATININE, CALCIUM, GFRNONAA, GFRAA, ANIONGAP in the last 168 hours.  No results for input(s): PROT, ALBUMIN, AST, ALT, ALKPHOS, BILITOT in the last 168 hours. HematologyNo results for input(s): WBC, RBC, HGB, HCT, MCV, MCH, MCHC, RDW, PLT in the last 168 hours. BNPNo results for input(s): BNP, PROBNP in the last 168 hours.  DDimer No results for input(s): DDIMER in the last 168 hours.   Radiology/Studies:  No results found.   Assessment and Plan:   1. ICM 2. CAD     Cath as above, no intervention  3. Chronic CHF 4. Symptomatic sustained VT  We discussed the indication for ICD for him,  the implant procedure, and potential risks, benefits.  He would like to proceed Will check BMET and mag given his NSVT Last CBC with mildly elevated WBC of 13, will repeat (he has no symptoms of illness)  Will wait on his labs, not overtly volume OL, though bloated with some mild DOE last few days Plan for some lasix  He will need AAD I think He has been cautioned about driving, he reports he has not been since the dizzy spells have started   5. 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  He is appropriately considered already for primary prevention ICD but now has essentially sustained and will need concomitant antiarrhythmic therapy.   Have reviewed the potential benefits and risks of ICD implantation including but not limited to death,  perforation of heart or lung, lead dislodgement, infection,  device malfunction and inappropriate shocks.  The patient expresses understanding  and is willing to proceed.    QT prolongation precludes class 3 and no other drug will contribute to control of Afib,  Have reviewed with Dr Dorthea Cove and will begin amiodarone  Will need anticoagulation for afib and with his CHF will undertake diuresis and Dr Dorthea Cove will consider TEE guided DCCV  Will give IV lasix following the procedure

## 2020-08-22 NOTE — Discharge Instructions (Signed)
    Supplemental Discharge Instructions for  Pacemaker/Defibrillator Patients  Tomorrow, 08/23/2020, send in a device transmission  Activity No heavy lifting or vigorous activity with your left/right arm for 6 to 8 weeks.  Do not raise your left/right arm above your head for one week.  Gradually raise your affected arm as drawn below.             08/26/2020               08/27/2020             08/28/2020             08/29/2020  __  NO DRIVING until cleared to by your doctor  WOUND CARE - Keep the wound area clean and dry.  Do not get this area wet , no showers for one week; you may shower on  08/29/2020  . - Tomorrow, 08/23/2020, remove the arm sling - Tomorrow, 08/23/2020 remove the outer plastic bandage.  Underneath the plastic bandage there are steris strips (paper tapes), DO NOT remove these. - The tape/steri-strips on your wound will fall off; do not pull them off.  No bandage is needed on the site.  DO  NOT apply any creams, oils, or ointments to the wound area. - If you notice any drainage or discharge from the wound, any swelling or bruising at the site, or you develop a fever > 101? F after you are discharged home, call the office at once.  Special Instructions - You are still able to use cellular telephones; use the ear opposite the side where you have your pacemaker/defibrillator.  Avoid carrying your cellular phone near your device. - When traveling through airports, show security personnel your identification card to avoid being screened in the metal detectors.  Ask the security personnel to use the hand wand. - Avoid arc welding equipment, MRI testing (magnetic resonance imaging), TENS units (transcutaneous nerve stimulators).  Call the office for questions about other devices. - Avoid electrical appliances that are in poor condition or are not properly grounded. - Microwave ovens are safe to be near or to operate.  Additional information for defibrillator patients should  your device go off: - If your device goes off ONCE and you feel fine afterward, notify the device clinic nurses. - If your device goes off ONCE and you do not feel well afterward, call 911. - If your device goes off TWICE, call 911. - If your device goes off THREE times in one day, call 911.  DO NOT DRIVE YOURSELF OR A FAMILY MEMBER WITH A DEFIBRILLATOR TO THE HOSPITAL--CALL 911.

## 2020-08-23 ENCOUNTER — Encounter (HOSPITAL_COMMUNITY): Payer: Self-pay | Admitting: Internal Medicine

## 2020-08-23 ENCOUNTER — Telehealth: Payer: Self-pay

## 2020-08-23 NOTE — Telephone Encounter (Signed)
Activation transmission shows AF with controlled V rates.  Pt started on Amiodarone 200mg  BID yesterday and directed to start Eliquis on 10/15.    Spoke with pt, he is asymptomatic.  He confirmed compliance with medications.  Pt sent manual transmission that confirms pot still in AF today.  Advised pty would forward to Dr. 11/15 for review.

## 2020-08-25 NOTE — Telephone Encounter (Signed)
It is asymptomatic AF, that they were aware of at implant, so I feel it is more of an FYI for now.

## 2020-09-01 ENCOUNTER — Other Ambulatory Visit: Payer: Self-pay | Admitting: Family Medicine

## 2020-09-01 NOTE — Telephone Encounter (Signed)
Given his recent cardiac issues he would need to clear this with his heart team

## 2020-09-01 NOTE — Telephone Encounter (Signed)
LFD 06/10/20 #30 with 3 refills LOV 06/17/20 NOV 09/21/20

## 2020-09-05 ENCOUNTER — Encounter (HOSPITAL_COMMUNITY): Payer: Self-pay

## 2020-09-05 MED ORDER — CARVEDILOL 12.5 MG PO TABS
18.7500 mg | ORAL_TABLET | Freq: Two times a day (BID) | ORAL | 2 refills | Status: DC
Start: 2020-09-05 — End: 2021-10-12

## 2020-09-05 NOTE — Addendum Note (Signed)
Addended by: Noralee Space on: 09/05/2020 05:30 PM   Modules accepted: Orders

## 2020-09-06 ENCOUNTER — Ambulatory Visit (INDEPENDENT_AMBULATORY_CARE_PROVIDER_SITE_OTHER): Payer: BC Managed Care – PPO | Admitting: Emergency Medicine

## 2020-09-06 ENCOUNTER — Other Ambulatory Visit: Payer: Self-pay

## 2020-09-06 DIAGNOSIS — I5043 Acute on chronic combined systolic (congestive) and diastolic (congestive) heart failure: Secondary | ICD-10-CM

## 2020-09-06 LAB — CUP PACEART INCLINIC DEVICE CHECK
Date Time Interrogation Session: 20211026103548
HighPow Impedance: 72 Ohm
Implantable Lead Implant Date: 20211011
Implantable Lead Implant Date: 20211011
Implantable Lead Location: 753859
Implantable Lead Location: 753860
Implantable Lead Model: 138
Implantable Lead Model: 7841
Implantable Lead Serial Number: 1097338
Implantable Lead Serial Number: 303261
Implantable Pulse Generator Implant Date: 20211011
Lead Channel Impedance Value: 570 Ohm
Lead Channel Impedance Value: 579 Ohm
Lead Channel Pacing Threshold Amplitude: 0.6 V
Lead Channel Pacing Threshold Amplitude: 0.6 V
Lead Channel Pacing Threshold Pulse Width: 0.4 ms
Lead Channel Pacing Threshold Pulse Width: 0.4 ms
Lead Channel Sensing Intrinsic Amplitude: 20.6 mV
Lead Channel Sensing Intrinsic Amplitude: 5.6 mV
Lead Channel Setting Pacing Amplitude: 3.5 V
Lead Channel Setting Pacing Pulse Width: 0.4 ms
Lead Channel Setting Sensing Sensitivity: 0.5 mV
Pulse Gen Serial Number: 228262

## 2020-09-06 NOTE — Telephone Encounter (Signed)
On anticoagulation and has appt with Dr Dorthea Cove next week and anticpate TEEDCCV or DCCV to be arranged at taht time

## 2020-09-06 NOTE — Progress Notes (Signed)
Wound check appointment. Steri-strips removed. Wound without redness or edema. Incision edges approximated, wound well healed. Normal device function. Thresholds, sensing, and impedances consistent with implant measurements. Device programmed at 3.5V for extra safety margin until 3 month visit. Histogram distribution appropriate for patient and level of activity. No ventricular arrhythmias noted. Patient educated about wound care, arm mobility, lifting restrictions, shock plan. Driving restrictions discussed, advised to follow-up with Dr. [name] about restrictions. Verbalized understanding. ROV in 3 months with Dr. Graciela Husbands.

## 2020-09-13 ENCOUNTER — Other Ambulatory Visit (HOSPITAL_COMMUNITY): Payer: Self-pay | Admitting: *Deleted

## 2020-09-13 ENCOUNTER — Ambulatory Visit (HOSPITAL_COMMUNITY)
Admission: RE | Admit: 2020-09-13 | Discharge: 2020-09-13 | Disposition: A | Discharge status: Home / Self Care | Payer: BC Managed Care – PPO | Source: Ambulatory Visit | Attending: Internal Medicine | Admitting: Internal Medicine

## 2020-09-13 ENCOUNTER — Other Ambulatory Visit: Payer: Self-pay

## 2020-09-13 DIAGNOSIS — R42 Dizziness and giddiness: Secondary | ICD-10-CM

## 2020-09-13 NOTE — Progress Notes (Signed)
ADVANCED HF CLINIC NOTE    HPI:  Jason Floyd is 54 year old with a PMH of severe systolic heart failure due to NICM, DM2, former polysubstance and tobacco abuse.    Echo 2008 EF 15% in setting of polysubstance abuse.   ECHO 12/2007 EF 55-60%  Echo 10/20 EF 55-60% no RWMA.   On 06/07/20 had TIA-like symptoms with mild aphasia and weakness (dropped a can with left hand). Went to PCP had carotid u/s and echo. Carotid u/s 06/22/20 1-39% bilaterally. Echo 8/21 EF down to 25-30%. Brain MRI/MRA. Normal vasculature.  Left corona radiata diffusion-weighted/FLAIR hyperintensity is nonspecific. Differential includes active demyelinating lesionversus subacute insult.  Underwent R/L cath 07/01/20 LAD 100% prox LCX 20% RCA 10%  EF 25-30% Ao = 112/72 (90) LV = 111/27 RA = 2 RV = 46/7 PA = 44/12 (27) PCW = 16 Fick cardiac output/index = 6.8/2.9 PVR = 1.5 WU SVR 1047 Ao sat = 96% PA sat = 70%, 78% SVC sat = 74%  After cath underwent cMRI.for viability  - LVEF 26% - LGE suggestive of a very large, wrap-around LAD infarction with extensive no-reflow affecting the subendocardium.   Zio monitored placed for palpitations in 10/21 and found to have VT. Underwent ICD placement by Dr. Graciela Husbands on 08/22/20 Was also found to have volume overload and AF.  Here for f/u. Feels a lot better. Breathing better. Starting to be more active. No CP or SOB. No edema. BP runs low. Occasional orthostasis.   Past Medical History:  Diagnosis Date  . Allergy   . Arthritis   . CHF (congestive heart failure) (HCC)   . CHF (congestive heart failure) (HCC)   . Diabetes mellitus without complication (HCC)   . Drug abuse (HCC)   . Eczema   . Gout   . Hyperlipidemia   . MI (myocardial infarction) (HCC)   . Nephrolithiasis   . PE (pulmonary embolism)     Current Outpatient Medications  Medication Sig Dispense Refill  . allopurinol (ZYLOPRIM) 300 MG tablet TAKE 2 TABLETS BY MOUTH DAILY. (Patient taking  differently: Take 600 mg by mouth daily. ) 60 tablet 6  . amiodarone (PACERONE) 200 MG tablet Take 1 tablet (200 mg total) by mouth 2 (two) times daily. 60 tablet 0  . [START ON 09/22/2020] amiodarone (PACERONE) 200 MG tablet Take 1 tablet (200 mg total) by mouth daily. 90 tablet 2  . apixaban (ELIQUIS) 5 MG TABS tablet Take 1 tablet (5 mg total) by mouth 2 (two) times daily. 60 tablet 6  . aspirin EC 81 MG tablet Take 81 mg by mouth daily. Swallow whole.    Marland Kitchen atorvastatin (LIPITOR) 80 MG tablet Take 1 tablet (80 mg total) by mouth daily. 30 tablet 6  . calcium carbonate (TUMS - DOSED IN MG ELEMENTAL CALCIUM) 500 MG chewable tablet Chew 2-3 tablets by mouth daily as needed for indigestion or heartburn.    . carvedilol (COREG) 12.5 MG tablet Take 1.5 tablets (18.75 mg total) by mouth 2 (two) times daily with a meal. 540 tablet 2  . cetirizine (ZYRTEC) 10 MG tablet Take 10 mg by mouth daily.    Marland Kitchen FARXIGA 5 MG TABS tablet TAKE 1 TABLET BY MOUTH DAILY 30 tablet 6  . fenofibrate 160 MG tablet TAKE 1 TABLET BY MOUTH DAILY. (Patient taking differently: Take 160 mg by mouth daily. ) 90 tablet 1  . furosemide (LASIX) 40 MG tablet Take 1 tablet (40 mg total) by mouth daily. 30 tablet  6  . meloxicam (MOBIC) 15 MG tablet Take 1 tablet (15 mg total) by mouth daily. 30 tablet 3  . metFORMIN (GLUCOPHAGE) 1000 MG tablet TAKE 1 TABLET BY MOUTH 2 TIMES DAILY WITH A MEAL. 180 tablet 0  . methocarbamol (ROBAXIN) 500 MG tablet Take 1 tablet (500 mg total) by mouth at bedtime. 90 tablet 1  . Naphazoline HCl (CLEAR EYES OP) Place 1 drop into both eyes daily as needed (sore eyes).    . pantoprazole (PROTONIX) 40 MG tablet TAKE 1 TABLET BY MOUTH DAILY. 30 tablet 3  . potassium chloride 20 MEQ TBCR Take 20 mEq by mouth daily. 30 tablet 6  . sacubitril-valsartan (ENTRESTO) 49-51 MG Take 1 tablet by mouth 2 (two) times daily. 60 tablet 6  . sodium chloride (OCEAN) 0.65 % SOLN nasal spray Place 1 spray into both nostrils as  needed (dryness).    . traZODone (DESYREL) 100 MG tablet TAKE 1 TABLET BY MOUTH AT BEDTIME. (Patient taking differently: Take 100 mg by mouth at bedtime. ) 30 tablet 6  . triamcinolone (KENALOG) 0.025 % ointment APPLY TO THE AFFECTED AREA 2 TIMES DAILY. 80 g 1   No current facility-administered medications for this encounter.    No Known Allergies    Social History   Socioeconomic History  . Marital status: Married    Spouse name: [name]  . Number of children: Not on file  . Years of education: Not on file  . Highest education level: Bachelor's degree (e.g., BA, AB, BS)  Occupational History  . Not on file  Tobacco Use  . Smoking status: Former Smoker    Packs/day: 0.25    Years: 31.00    Pack years: 7.75    Types: Cigarettes  . Smokeless tobacco: Never Used  . Tobacco comment: 07/28/20 trying to quit  Vaping Use  . Vaping Use: Never used  Substance and Sexual Activity  . Alcohol use: Not Currently    Alcohol/week: 0.0 standard drinks    Comment: occasionally  . Drug use: Not Currently    Types: Marijuana  . Sexual activity: Not on file  Other Topics Concern  . Not on file  Social History Narrative   Lives with wife   Caffeine- coffee 4 c daily   Social Determinants of Health   Financial Resource Strain:   . Difficulty of Paying Living Expenses: Not on file  Food Insecurity:   . Worried About Programme researcher, broadcasting/film/video in the Last Year: Not on file  . Ran Out of Food in the Last Year: Not on file  Transportation Needs:   . Lack of Transportation (Medical): Not on file  . Lack of Transportation (Non-Medical): Not on file  Physical Activity:   . Days of Exercise per Week: Not on file  . Minutes of Exercise per Session: Not on file  Stress:   . Feeling of Stress : Not on file  Social Connections:   . Frequency of Communication with Friends and Family: Not on file  . Frequency of Social Gatherings with Friends and Family: Not on file  . Attends Religious Services: Not  on file  . Active Member of Clubs or Organizations: Not on file  . Attends Banker Meetings: Not on file  . Marital Status: Not on file  Intimate Partner Violence:   . Fear of Current or Ex-Partner: Not on file  . Emotionally Abused: Not on file  . Physically Abused: Not on file  . Sexually Abused: Not  on file      Family History  Problem Relation Age of Onset  . Dementia Mother        frontal-temporal  . Stroke Mother   . Breast cancer Maternal Aunt   . Other Brother        overdose  . Colon cancer Neg Hx     Vitals:   09/14/20 1410  BP: (!) 90/50  Pulse: 71  Weight: 102.4 kg (225 lb 12.8 oz)    PHYSICAL EXAM: General:  Well appearing. No resp difficulty HEENT: normal Neck: supple. no JVD. Carotids 2+ bilat; no bruits. No lymphadenopathy or thryomegaly appreciated. Cor: PMI nondisplaced. Regular rate & rhythm. No rubs, gallops or murmurs. ICD site ok  Lungs: clear Abdomen: soft, nontender, nondistended. No hepatosplenomegaly. No bruits or masses. Good bowel sounds. Extremities: no cyanosis, clubbing, rash, edema Neuro: alert & orientedx3, cranial nerves grossly intact. moves all 4 extremities w/o difficulty. Affect pleasant  EGC NSR 73 with 1AVB ( ) Anterior qs. Personally reviewed  ICD: Interrogated personally. No recent AF. No NSVT since 10/26. Personally reviewed  ASSESSMENT & PLAN:  1. Recurrent systolic HF due to iCM - Echo 6301 EF 15% in setting of polysubstance abuse.  - ECHO 12/2007 EF 55-60% - Echo 10/20 EF 55-60% no RWMA.  - Echo 8/21 EF 25-30% - Cath 8/21 LAD 100% LCx 20% RCA 10% - cMRI 08/02/20 EF 26% Large anterior infarct - ICD placed 10/21 for VT - Improved with restoration of NSR.  - NYHA II - Volume status ok. Cut lasix and KCL  back to 40 qod - Continue carvedilol 12.5 bid - Continue Entresto 49/51 bid (cut back due to low BP recently) - Continue Farxiga. - No spiro yet with low BP   2. CAD - cath as above with subacute  LAD infarct and CTO LAD - no s/s ischemia - continue ASA, statin. Increase atorva to 80 - repeat echo next visit  3. VT - s/p ICD 10/21 - now on amio  - Keep K. 4.0 Mg > 2.0  4. Persistent AF - now back in NSR - continue amio and Eliquis - can drop amio to 200 daily next week   5. Tobacco use - working on it.  counseled on need for cessation  6. DM2 - continue SGLT2i, metformin  - followed by PCP  7. Possible TIA - carotid dopplers ok - Brain MRI/MRA. Vasculature ok. Findings suspicious for cardio-embolism.   Arvilla Meres, MD  2:20 PM

## 2020-09-14 ENCOUNTER — Ambulatory Visit (HOSPITAL_COMMUNITY)
Admission: RE | Admit: 2020-09-14 | Discharge: 2020-09-14 | Disposition: A | Discharge status: Home / Self Care | Payer: BC Managed Care – PPO | Source: Ambulatory Visit | Attending: Internal Medicine | Admitting: Internal Medicine

## 2020-09-14 ENCOUNTER — Encounter (HOSPITAL_COMMUNITY): Payer: Self-pay | Admitting: Internal Medicine

## 2020-09-14 ENCOUNTER — Other Ambulatory Visit: Payer: Self-pay

## 2020-09-14 ENCOUNTER — Encounter (HOSPITAL_COMMUNITY): Payer: Self-pay | Admitting: *Deleted

## 2020-09-14 VITALS — BP 90/50 | HR 71 | Wt 225.8 lb

## 2020-09-14 DIAGNOSIS — Z79899 Other long term (current) drug therapy: Secondary | ICD-10-CM | POA: Insufficient documentation

## 2020-09-14 DIAGNOSIS — I252 Old myocardial infarction: Secondary | ICD-10-CM | POA: Insufficient documentation

## 2020-09-14 DIAGNOSIS — F17210 Nicotine dependence, cigarettes, uncomplicated: Secondary | ICD-10-CM | POA: Insufficient documentation

## 2020-09-14 DIAGNOSIS — I4819 Other persistent atrial fibrillation: Secondary | ICD-10-CM | POA: Diagnosis not present

## 2020-09-14 DIAGNOSIS — I5022 Chronic systolic (congestive) heart failure: Secondary | ICD-10-CM | POA: Diagnosis not present

## 2020-09-14 DIAGNOSIS — Z7982 Long term (current) use of aspirin: Secondary | ICD-10-CM | POA: Diagnosis not present

## 2020-09-14 DIAGNOSIS — I428 Other cardiomyopathies: Secondary | ICD-10-CM | POA: Insufficient documentation

## 2020-09-14 DIAGNOSIS — Z791 Long term (current) use of non-steroidal anti-inflammatories (NSAID): Secondary | ICD-10-CM | POA: Insufficient documentation

## 2020-09-14 DIAGNOSIS — E119 Type 2 diabetes mellitus without complications: Secondary | ICD-10-CM | POA: Diagnosis not present

## 2020-09-14 DIAGNOSIS — I472 Ventricular tachycardia: Secondary | ICD-10-CM | POA: Diagnosis not present

## 2020-09-14 DIAGNOSIS — Z716 Tobacco abuse counseling: Secondary | ICD-10-CM | POA: Insufficient documentation

## 2020-09-14 DIAGNOSIS — Z7901 Long term (current) use of anticoagulants: Secondary | ICD-10-CM | POA: Insufficient documentation

## 2020-09-14 DIAGNOSIS — I480 Paroxysmal atrial fibrillation: Secondary | ICD-10-CM

## 2020-09-14 DIAGNOSIS — Z95810 Presence of automatic (implantable) cardiac defibrillator: Secondary | ICD-10-CM | POA: Diagnosis not present

## 2020-09-14 DIAGNOSIS — Z7984 Long term (current) use of oral hypoglycemic drugs: Secondary | ICD-10-CM | POA: Diagnosis not present

## 2020-09-14 DIAGNOSIS — F1910 Other psychoactive substance abuse, uncomplicated: Secondary | ICD-10-CM | POA: Diagnosis not present

## 2020-09-14 DIAGNOSIS — I2510 Atherosclerotic heart disease of native coronary artery without angina pectoris: Secondary | ICD-10-CM | POA: Insufficient documentation

## 2020-09-14 LAB — CBC
HCT: 50.3 % (ref 39.0–52.0)
Hemoglobin: 16.4 g/dL (ref 13.0–17.0)
MCH: 30 pg (ref 26.0–34.0)
MCHC: 32.6 g/dL (ref 30.0–36.0)
MCV: 92 fL (ref 80.0–100.0)
Platelets: 265 10*3/uL (ref 150–400)
RBC: 5.47 MIL/uL (ref 4.22–5.81)
RDW: 13.2 % (ref 11.5–15.5)
WBC: 11.3 10*3/uL — ABNORMAL HIGH (ref 4.0–10.5)
nRBC: 0 % (ref 0.0–0.2)

## 2020-09-14 LAB — BASIC METABOLIC PANEL
Anion gap: 10 (ref 5–15)
BUN: 27 mg/dL — ABNORMAL HIGH (ref 6–20)
CO2: 25 mmol/L (ref 22–32)
Calcium: 10 mg/dL (ref 8.9–10.3)
Chloride: 102 mmol/L (ref 98–111)
Creatinine, Ser: 1.46 mg/dL — ABNORMAL HIGH (ref 0.61–1.24)
GFR, Estimated: 56 mL/min — ABNORMAL LOW (ref >60)
Glucose, Bld: 103 mg/dL — ABNORMAL HIGH (ref 70–99)
Potassium: 4.7 mmol/L (ref 3.5–5.1)
Sodium: 137 mmol/L (ref 135–145)

## 2020-09-14 MED ORDER — FUROSEMIDE 40 MG PO TABS: 40.0000 mg | ORAL_TABLET | ORAL | 6 refills | Status: DC

## 2020-09-14 MED ORDER — POTASSIUM CHLORIDE ER 20 MEQ PO TBCR: 20.0000 meq | EXTENDED_RELEASE_TABLET | ORAL | 6 refills | Status: DC

## 2020-09-14 NOTE — Addendum Note (Signed)
Encounter addended by: Delray Reza R, MD on: 09/14/2020 3:43 PM  Actions taken: Charge Capture section accepted

## 2020-09-14 NOTE — Patient Instructions (Addendum)
Decrease Furosemide to 40 mg every other day   Decrease Potassium to 20 meq every other day   Labs done today, your results will be available in MyChart, we will contact you for abnormal readings.  Your physician recommends that you schedule a follow-up appointment in: 4-6 weeks with echocardiogram  If you have any questions or concerns before your next appointment please send [place] a message through [place] or call our office at [phone].    TO LEAVE A MESSAGE FOR THE NURSE SELECT OPTION 2, PLEASE LEAVE A MESSAGE INCLUDING: . YOUR NAME . DATE OF BIRTH . CALL BACK NUMBER . REASON FOR CALL**this is important as we prioritize the call backs  YOU WILL RECEIVE A CALL BACK THE SAME DAY AS LONG AS YOU CALL BEFORE 4:00 PM  At [facility], you and your health needs are our priority. As part of our continuing mission to provide you with exceptional heart care, we have created designated Provider Care Teams. These Care Teams include your primary Cardiologist (physician) and Advanced Practice Providers (APPs- Physician Assistants and Nurse Practitioners) who all work together to provide you with the care you need, when you need it.   You may see any of the following providers on your designated Care Team at your next follow up: Marland Kitchen Dr Arvilla Meres . Dr Marca Ancona . Tonye Becket, NP . Robbie Lis, PA . Karle Plumber, PharmD   Please be sure to bring in all your medications bottles to every appointment.

## 2020-09-14 NOTE — Addendum Note (Signed)
Encounter addended by: Dolores Patty, MD on: 09/14/2020 3:42 PM  Actions taken: Level of Service modified, Visit diagnoses modified

## 2020-09-15 ENCOUNTER — Other Ambulatory Visit: Payer: Self-pay | Admitting: Family Medicine

## 2020-09-15 ENCOUNTER — Telehealth: Payer: Self-pay | Admitting: Sports Medicine

## 2020-09-15 ENCOUNTER — Encounter (HOSPITAL_COMMUNITY): Payer: Self-pay

## 2020-09-15 NOTE — Telephone Encounter (Signed)
Last refill on 06/10/20     30 tabs with 3 refills.

## 2020-09-15 NOTE — Telephone Encounter (Signed)
Patient called states has Cardiology clearance to proceed with orthopedic needs-- Per pt he discuss Arthroscopy w/Dr.Draper. See message below:  Ralene Cork, DO   06/27/20 1:59 PM Note  I spoke with the patient on the phone today after reviewing MRI findings of his right knee.  He has a complex tear of the medial meniscus in the setting of mild to moderate osteoarthritis.  He also has a septated Baker's cyst.  He is still getting mechanical symptoms but they sound tolerable.  I discussed trying an intra-articular cortisone injection versus referral to orthopedics to discuss arthroscopy.  Patient informed me that he was recently diagnosed with congestive heart failure and has an appointment with cardiology in 2 weeks.  I recommended that he see the cardiologist first and then follow-up with me afterwards.     --forwarding message to med asst for review w/provider & scheduling of procedure if still recommended.  --glh

## 2020-09-19 NOTE — Telephone Encounter (Signed)
Called pt and notified him. He said he will reach out to his cardiologist for this medication refill.    Rogers Seeds, RN

## 2020-09-20 ENCOUNTER — Encounter (HOSPITAL_COMMUNITY): Payer: Self-pay

## 2020-09-21 ENCOUNTER — Encounter: Payer: Self-pay | Admitting: Family Medicine

## 2020-09-21 ENCOUNTER — Ambulatory Visit (INDEPENDENT_AMBULATORY_CARE_PROVIDER_SITE_OTHER): Payer: BC Managed Care – PPO | Admitting: Family Medicine

## 2020-09-21 ENCOUNTER — Other Ambulatory Visit: Payer: Self-pay

## 2020-09-21 VITALS — BP 100/60 | HR 63 | Temp 97.7°F | Resp 20 | Ht 74.0 in | Wt 231.8 lb

## 2020-09-21 DIAGNOSIS — I2510 Atherosclerotic heart disease of native coronary artery without angina pectoris: Secondary | ICD-10-CM

## 2020-09-21 DIAGNOSIS — I472 Ventricular tachycardia, unspecified: Secondary | ICD-10-CM | POA: Insufficient documentation

## 2020-09-21 DIAGNOSIS — E118 Type 2 diabetes mellitus with unspecified complications: Secondary | ICD-10-CM | POA: Diagnosis not present

## 2020-09-21 DIAGNOSIS — I5042 Chronic combined systolic (congestive) and diastolic (congestive) heart failure: Secondary | ICD-10-CM

## 2020-09-21 NOTE — Assessment & Plan Note (Signed)
Pt had hx of heart failure but was doing incredibly well until this summer when he had an MI that caused significant myocardial damage and again reduced EF to 25%.  Since August, his EF has been improving and he is following regularly w Dr [name] and the heart failure team.  Will continue to follow along.

## 2020-09-21 NOTE — Patient Instructions (Signed)
Schedule your complete physical in 3-4 months We'll notify you of your lab results and make any changes if needed Keep up the good work!  I am so proud of how far you've come since August Call with any questions or concerns Stay Safe!  Stay Healthy! Happy Holidays!!

## 2020-09-21 NOTE — Assessment & Plan Note (Signed)
New.  Pt had MI in August although this did not present as a typical MI.  It was during his workup for TIA that it was discovered he had an MI and subsequently again had CHF.  He is currently asymptomatic and following closely w/ cards.  My goal will be good sugar and lipid control.  Pt expressed understanding and is in agreement w/ plan.

## 2020-09-21 NOTE — Assessment & Plan Note (Signed)
Ongoing issue for pt.  Last A1C 6.9.  I had some concern today about keeping him on his Metformin given his reoccurrence of CHF but Dr [name] noted that he should continue this at his September visit.  Will continue to monitor EF and Cr closely and if needed will decrease or stop Metformin in the setting of CHF.  Pt expressed understanding and is in agreement w/ plan.

## 2020-09-21 NOTE — Assessment & Plan Note (Signed)
New.  Currently asymptomatic s/p placement of ICD.

## 2020-09-21 NOTE — Progress Notes (Signed)
   Subjective:    Patient ID: Jason Floyd, male    DOB: March 09, 1966, 54 y.o.   MRN: [id number]  HPI DM- chronic problem, on Metformin 1000mg  BID and Farxiga 5mg  daily.  Last A1C 6.9%  Pt is down 4 lbs since last visit.  UTD on eye exam, foot exam.  On Entresto for renal protection.  Denies symptomatic lows.  No numbness/tingling of hands/feet.  No sores on feet.  No abd pain, N/V.  CHF- EF dropped from 55%-60% to 25% after MI in August.  Thankfully EF has increased back to 40% as of 9/23 and repeat ECHO is upcoming.  Following w/ Dr 09-08-1980.  No edema, SOB.  CAD- pt had MI in August that caused myocardial damage and subsequently decreased EF to 25% (down from 55-60% the year before).  No current CP.  Jason Floyd- pt had ICD implanted on 10/11 and has felt better since then.  No longer having runs of Vtach.   Review of Systems For ROS see HPI   This visit occurred during the SARS-CoV-2 public health emergency.  Safety protocols were in place, including screening questions prior to the visit, additional usage of staff PPE, and extensive cleaning of exam room while observing appropriate contact time as indicated for disinfecting solutions.       Objective:   Physical Exam Vitals reviewed.  Constitutional:      General: He is not in acute distress.    Appearance: Normal appearance. He is well-developed. He is not ill-appearing.  HENT:     Head: Normocephalic and atraumatic.  Eyes:     Conjunctiva/sclera: Conjunctivae normal.     Pupils: Pupils are equal, round, and reactive to light.  Neck:     Thyroid: No thyromegaly.  Cardiovascular:     Rate and Rhythm: Normal rate and regular rhythm.     Heart sounds: Normal heart sounds. No murmur heard.   Pulmonary:     Effort: Pulmonary effort is normal. No respiratory distress.     Breath sounds: Normal breath sounds.  Abdominal:     General: Bowel sounds are normal. There is no distension.     Palpations: Abdomen is soft.    Musculoskeletal:     Cervical back: Normal range of motion and neck supple.     Right lower leg: No edema.     Left lower leg: No edema.  Lymphadenopathy:     Cervical: No cervical adenopathy.  Skin:    General: Skin is warm and dry.  Neurological:     Mental Status: He is alert and oriented to person, place, and time.     Cranial Nerves: No cranial nerve deficit.  Psychiatric:        Behavior: Behavior normal.           Assessment & Plan:

## 2020-09-22 ENCOUNTER — Telehealth: Payer: Self-pay | Admitting: Family Medicine

## 2020-09-22 LAB — BASIC METABOLIC PANEL
BUN: 23 mg/dL (ref 6–23)
CO2: 26 mEq/L (ref 19–32)
Calcium: 9.5 mg/dL (ref 8.4–10.5)
Chloride: 103 mEq/L (ref 96–112)
Creatinine, Ser: 1.26 mg/dL (ref 0.40–1.50)
GFR: 64.03 mL/min (ref >60.00)
Glucose, Bld: 112 mg/dL — ABNORMAL HIGH (ref 70–99)
Potassium: 4.7 mEq/L (ref 3.5–5.1)
Sodium: 138 mEq/L (ref 135–145)

## 2020-09-22 LAB — HEMOGLOBIN A1C: Hgb A1c MFr Bld: 7 % — ABNORMAL HIGH (ref 4.6–6.5)

## 2020-09-22 NOTE — Telephone Encounter (Signed)
Pt called in stating that he saw lab results on mychart

## 2020-09-22 NOTE — Telephone Encounter (Signed)
Noted  

## 2020-09-26 ENCOUNTER — Other Ambulatory Visit: Payer: Self-pay | Admitting: Family Medicine

## 2020-09-26 NOTE — Telephone Encounter (Signed)
Lat OV 09/21/20  Last refill 03/15/20--30 tabs/ 6 refills

## 2020-10-11 ENCOUNTER — Encounter (HOSPITAL_COMMUNITY): Payer: Self-pay

## 2020-10-12 ENCOUNTER — Telehealth: Payer: Self-pay | Admitting: Family Medicine

## 2020-10-12 NOTE — Telephone Encounter (Signed)
Forms placed in [place] bin for review

## 2020-10-12 NOTE — Telephone Encounter (Signed)
Pre surgical clearance form placed in Dr. Rennis Golden bin up front

## 2020-10-17 ENCOUNTER — Other Ambulatory Visit: Payer: Self-pay

## 2020-10-17 ENCOUNTER — Ambulatory Visit (HOSPITAL_BASED_OUTPATIENT_CLINIC_OR_DEPARTMENT_OTHER)
Admission: RE | Admit: 2020-10-17 | Discharge: 2020-10-17 | Disposition: A | Discharge status: Home / Self Care | Payer: BC Managed Care – PPO | Source: Ambulatory Visit | Attending: Internal Medicine | Admitting: Internal Medicine

## 2020-10-17 ENCOUNTER — Ambulatory Visit (HOSPITAL_COMMUNITY)
Admission: RE | Admit: 2020-10-17 | Discharge: 2020-10-17 | Disposition: A | Discharge status: Home / Self Care | Payer: BC Managed Care – PPO | Source: Ambulatory Visit | Attending: Internal Medicine | Admitting: Internal Medicine

## 2020-10-17 ENCOUNTER — Encounter (HOSPITAL_COMMUNITY): Payer: Self-pay | Admitting: Internal Medicine

## 2020-10-17 VITALS — BP 96/78 | HR 60 | Wt 232.8 lb

## 2020-10-17 DIAGNOSIS — E119 Type 2 diabetes mellitus without complications: Secondary | ICD-10-CM | POA: Insufficient documentation

## 2020-10-17 DIAGNOSIS — F17210 Nicotine dependence, cigarettes, uncomplicated: Secondary | ICD-10-CM | POA: Diagnosis not present

## 2020-10-17 DIAGNOSIS — F1910 Other psychoactive substance abuse, uncomplicated: Secondary | ICD-10-CM | POA: Diagnosis not present

## 2020-10-17 DIAGNOSIS — I2510 Atherosclerotic heart disease of native coronary artery without angina pectoris: Secondary | ICD-10-CM

## 2020-10-17 DIAGNOSIS — I5042 Chronic combined systolic (congestive) and diastolic (congestive) heart failure: Secondary | ICD-10-CM | POA: Insufficient documentation

## 2020-10-17 DIAGNOSIS — I428 Other cardiomyopathies: Secondary | ICD-10-CM | POA: Diagnosis not present

## 2020-10-17 DIAGNOSIS — I5022 Chronic systolic (congestive) heart failure: Secondary | ICD-10-CM

## 2020-10-17 DIAGNOSIS — E785 Hyperlipidemia, unspecified: Secondary | ICD-10-CM | POA: Diagnosis not present

## 2020-10-17 DIAGNOSIS — I252 Old myocardial infarction: Secondary | ICD-10-CM | POA: Diagnosis not present

## 2020-10-17 DIAGNOSIS — I480 Paroxysmal atrial fibrillation: Secondary | ICD-10-CM

## 2020-10-17 LAB — CBC
HCT: 46.9 % (ref 39.0–52.0)
Hemoglobin: 15.9 g/dL (ref 13.0–17.0)
MCH: 31 pg (ref 26.0–34.0)
MCHC: 33.9 g/dL (ref 30.0–36.0)
MCV: 91.4 fL (ref 80.0–100.0)
Platelets: 256 10*3/uL (ref 150–400)
RBC: 5.13 MIL/uL (ref 4.22–5.81)
RDW: 13.8 % (ref 11.5–15.5)
WBC: 9.4 10*3/uL (ref 4.0–10.5)
nRBC: 0 % (ref 0.0–0.2)

## 2020-10-17 LAB — ECHOCARDIOGRAM COMPLETE
Area-P 1/2: 2.24 cm2
Calc EF: 36.7 %
S' Lateral: 4.6 cm
Single Plane A2C EF: 40.8 %
Single Plane A4C EF: 37.3 %

## 2020-10-17 LAB — BASIC METABOLIC PANEL
Anion gap: 12 (ref 5–15)
BUN: 23 mg/dL — ABNORMAL HIGH (ref 6–20)
CO2: 23 mmol/L (ref 22–32)
Calcium: 9.9 mg/dL (ref 8.9–10.3)
Chloride: 103 mmol/L (ref 98–111)
Creatinine, Ser: 1.16 mg/dL (ref 0.61–1.24)
GFR, Estimated: >60 mL/min (ref >60)
Glucose, Bld: 97 mg/dL (ref 70–99)
Potassium: 4.8 mmol/L (ref 3.5–5.1)
Sodium: 138 mmol/L (ref 135–145)

## 2020-10-17 MED ORDER — MELOXICAM 15 MG PO TABS: 15.0000 mg | ORAL_TABLET | Freq: Every day | ORAL | 0 refills | Status: DC | PRN

## 2020-10-17 NOTE — Progress Notes (Signed)
ADVANCED HF CLINIC NOTE PCP: Dr. Neena Rhymes HF: Dr. [name] EP: Dr. Graciela Husbands   HPI:  [name] is 54 year old with a PMH of severe systolic heart failure due to NICM, DM2, former polysubstance and tobacco abuse.    Echo 2008 EF 15% in setting of polysubstance abuse.   ECHO 12/2007 EF 55-60%  Echo 10/20 EF 55-60% no RWMA.   On 06/07/20 had TIA-like symptoms with mild aphasia and weakness (dropped a can with left hand). Went to PCP had carotid u/s and echo. Carotid u/s 06/22/20 1-39% bilaterally. Echo 8/21 EF down to 25-30%. Brain MRI/MRA. Normal vasculature.  Left corona radiata diffusion-weighted/FLAIR hyperintensity is nonspecific. Differential includes active demyelinating lesionversus subacute insult.  Underwent R/L cath 07/01/20 LAD 100% prox LCX 20% RCA 10%  EF 25-30% Ao = 112/72 (90) LV = 111/27 RA = 2 RV = 46/7 PA = 44/12 (27) PCW = 16 Fick cardiac output/index = 6.8/2.9 PVR = 1.5 WU SVR 1047 Ao sat = 96% PA sat = 70%, 78% SVC sat = 74%  After cath underwent cMRI.for viability  - LVEF 26% - LGE suggestive of a very large, wrap-around LAD infarction with extensive no-reflow affecting the subendocardium.   Zio monitored placed for palpitations in 10/21 and found to have VT. Underwent ICD placement by Dr. Graciela Husbands on 08/22/20 Was also found to have volume overload and AF.  Here for f/u. Feels good no issues with breathing. Starting to be more active, but has torn right meniscus and having surgery Thursday. No CP or SOB. No edema. BP runs low. Occasional orthostasis. Not weighing at home.    Past Medical History:  Diagnosis Date  . Allergy   . Arthritis   . CHF (congestive heart failure) (HCC)   . CHF (congestive heart failure) (HCC)   . Diabetes mellitus without complication (HCC)   . Drug abuse (HCC)   . Eczema   . Gout   . Heart attack (HCC) 06/10/2020  . Hyperlipidemia   . MI (myocardial infarction) (HCC)   . Nephrolithiasis   . PE (pulmonary  embolism)     Current Outpatient Medications  Medication Sig Dispense Refill  . allopurinol (ZYLOPRIM) 300 MG tablet Take 600 mg by mouth daily.    Marland Kitchen amiodarone (PACERONE) 200 MG tablet Take 1 tablet (200 mg total) by mouth daily. 90 tablet 2  . apixaban (ELIQUIS) 5 MG TABS tablet Take 1 tablet (5 mg total) by mouth 2 (two) times daily. 60 tablet 6  . aspirin EC 81 MG tablet Take 81 mg by mouth daily. Swallow whole.    Marland Kitchen atorvastatin (LIPITOR) 80 MG tablet Take 1 tablet (80 mg total) by mouth daily. 30 tablet 6  . calcium carbonate (TUMS - DOSED IN MG ELEMENTAL CALCIUM) 500 MG chewable tablet Chew 2-3 tablets by mouth daily as needed for indigestion or heartburn.    . carvedilol (COREG) 12.5 MG tablet Take 1.5 tablets (18.75 mg total) by mouth 2 (two) times daily with a meal. 540 tablet 2  . cetirizine (ZYRTEC) 10 MG tablet Take 10 mg by mouth daily.    Marland Kitchen FARXIGA 5 MG TABS tablet TAKE 1 TABLET BY MOUTH DAILY 30 tablet 6  . fenofibrate 160 MG tablet Take 160 mg by mouth daily.    . furosemide (LASIX) 40 MG tablet Take 1 tablet (40 mg total) by mouth every other day. 30 tablet 6  . metFORMIN (GLUCOPHAGE) 1000 MG tablet TAKE 1 TABLET BY MOUTH 2 TIMES DAILY  WITH A MEAL. 180 tablet 0  . methocarbamol (ROBAXIN) 500 MG tablet Take 1 tablet (500 mg total) by mouth at bedtime. 90 tablet 1  . Naphazoline HCl (CLEAR EYES OP) Place 1 drop into both eyes daily as needed (sore eyes).    . pantoprazole (PROTONIX) 40 MG tablet TAKE 1 TABLET BY MOUTH DAILY. 30 tablet 3  . Potassium Chloride ER 20 MEQ TBCR Take 20 mEq by mouth every other day. 30 tablet 6  . sacubitril-valsartan (ENTRESTO) 49-51 MG Take 1 tablet by mouth 2 (two) times daily. 60 tablet 6  . sodium chloride (OCEAN) 0.65 % SOLN nasal spray Place 1 spray into both nostrils as needed (dryness).    . traZODone (DESYREL) 100 MG tablet TAKE 1 TABLET BY MOUTH AT BEDTIME. 30 tablet 6  . triamcinolone (KENALOG) 0.025 % ointment APPLY TO THE AFFECTED AREA  2 TIMES DAILY. 80 g 1  . meloxicam (MOBIC) 15 MG tablet Take 1 tablet (15 mg total) by mouth daily as needed for pain. 15 tablet 0   No current facility-administered medications for this encounter.    No Known Allergies    Social History   Socioeconomic History  . Marital status: Married    Spouse name: Jason Floyd  . Number of children: Not on file  . Years of education: Not on file  . Highest education level: Bachelor's degree (e.g., BA, AB, BS)  Occupational History  . Not on file  Tobacco Use  . Smoking status: Light Tobacco Smoker    Packs/day: 0.25    Years: 31.00    Pack years: 7.75    Types: Cigarettes  . Smokeless tobacco: Never Used  Vaping Use  . Vaping Use: Never used  Substance and Sexual Activity  . Alcohol use: Not Currently    Alcohol/week: 0.0 standard drinks    Comment: occasionally  . Drug use: Not Currently    Types: Marijuana  . Sexual activity: Not on file  Other Topics Concern  . Not on file  Social History Narrative   Lives with wife   Caffeine- coffee 4 c daily   Social Determinants of Health   Financial Resource Strain:   . Difficulty of Paying Living Expenses: Not on file  Food Insecurity:   . Worried About Programme researcher, broadcasting/film/video in the Last Year: Not on file  . Ran Out of Food in the Last Year: Not on file  Transportation Needs:   . Lack of Transportation (Medical): Not on file  . Lack of Transportation (Non-Medical): Not on file  Physical Activity:   . Days of Exercise per Week: Not on file  . Minutes of Exercise per Session: Not on file  Stress:   . Feeling of Stress : Not on file  Social Connections:   . Frequency of Communication with Friends and Family: Not on file  . Frequency of Social Gatherings with Friends and Family: Not on file  . Attends Religious Services: Not on file  . Active Member of Clubs or Organizations: Not on file  . Attends Banker Meetings: Not on file  . Marital Status: Not on file  Intimate  Partner Violence:   . Fear of Current or Ex-Partner: Not on file  . Emotionally Abused: Not on file  . Physically Abused: Not on file  . Sexually Abused: Not on file      Family History  Problem Relation Age of Onset  . Dementia Mother        frontal-temporal  .  Stroke Mother   . Breast cancer Maternal Aunt   . Other Brother        overdose  . Colon cancer Neg Hx     Vitals:   10/17/20 1205  BP: 96/78  Pulse: 60  SpO2: 98%  Weight: 105.6 kg (232 lb 12.8 oz)    PHYSICAL EXAM: General:  Well appearing. No resp difficulty HEENT: normal Neck: supple. no JVD. Carotids 2+ bilat; no bruits. No lymphadenopathy or thryomegaly appreciated. Cor: PMI nondisplaced. Regular rate & rhythm. No rubs, gallops or murmurs. Lungs: clear Abdomen: soft, nontender, nondistended. No hepatosplenomegaly. No bruits or masses. Good bowel sounds. Extremities: no cyanosis, clubbing, rash, edema Neuro: alert & orientedx3, cranial nerves grossly intact. moves all 4 extremities w/o difficulty. Affect pleasant  ICD: Interrogated personally. No recent AF. No NSVT since 11/21. Impedence way above threshold, activity level ~1-2.5 hrs/day. Personally reviewed.  ASSESSMENT & PLAN:  1. Recurrent systolic HF due to iCM - Echo 8502 EF 15% in setting of polysubstance abuse.  - ECHO 12/2007 EF 55-60% - Echo 10/20 EF 55-60% no RWMA.  - Echo 8/21 EF 25-30% - Cath 8/21 LAD 100% LCx 20% RCA 10% - cMRI 08/02/20 EF 26% Large anterior infarct - ICD placed 10/21 for VT - Echo today 10/17/20 EF 30-35% - NYHA I-II - Volume status ok on exam, dry on device interrogation and low BP - Can trial lasix to 2-3 times a week or prn or weight gain; stressed importance of daily weights - Continue carvedilol 12.5 bid - Continue Entresto 49/51 bid (cut back due to low BP recently) - Continue Farxiga 10 mg daily. - No spiro yet with low BP; log BP for 2 weeks then will decide if we can add Spiro 12.5 mg - Bmet today  2. CAD -  cath as above with subacute LAD infarct and CTO LAD - no s/s ischemia - continue ASA, statin.   3. VT - s/p ICD 10/21 - now on amio  - Keep K. 4.0 Mg > 2.0  4. Persistent AF - regular on exam today - continue amio 200 mg daily - Continue Eliquis 5 mg bid (insurance said he will have a $500 co-pay starting 11/12/20; Pharmacy aware and working on [employer]) - CBC today  5. Tobacco use - working on it.  counseled on need for cessation  6. DM2 - continue SGLT2i, metformin  - followed by PCP  7. Possible TIA - carotid dopplers ok - Brain MRI/MRA. Vasculature ok. Findings suspicious for cardio-embolism.   8. Medial Meniscus Tear, Right - scheduled for arthroscopy 10/20/20 with Dr. Margaretha Sheffield - off Mobic for several days and having increased knee pain  - refill meloxicam 15 mg prn given to get through till surgery; strict instructions to avoid daily NSAIDS, use max 1-2x/week, hopeful surgery will reduce need for further NSAID use  Arvilla Meres, MD  1:30 PM

## 2020-10-17 NOTE — Progress Notes (Signed)
  Echocardiogram 2D Echocardiogram has been performed.  Jason Floyd 10/17/2020, 11:53 AM

## 2020-10-17 NOTE — Patient Instructions (Signed)
Take Mobic 15 mg as needed  Labs done today, your results will be available in MyChart, we will contact you for abnormal readings.  Your physician recommends that you schedule a follow-up appointment in: 3-4 months  If you have any questions or concerns before your next appointment please send [place] a message through [place] or call our office at [phone].    TO LEAVE A MESSAGE FOR THE NURSE SELECT OPTION 2, PLEASE LEAVE A MESSAGE INCLUDING: . YOUR NAME . DATE OF BIRTH . CALL BACK NUMBER . REASON FOR CALL**this is important as we prioritize the call backs  YOU WILL RECEIVE A CALL BACK THE SAME DAY AS LONG AS YOU CALL BEFORE 4:00 PM  At the [facility], you and your health needs are our priority. As part of our continuing mission to provide you with exceptional heart care, we have created designated Provider Care Teams. These Care Teams include your primary Cardiologist (physician) and Advanced Practice Providers (APPs- Physician Assistants and Nurse Practitioners) who all work together to provide you with the care you need, when you need it.   You may see any of the following providers on your designated Care Team at your next follow up: Marland Kitchen Dr Arvilla Meres . Dr Marca Ancona . Tonye Becket, NP . Robbie Lis, PA . Karle Plumber, PharmD   Please be sure to bring in all your medications bottles to every appointment.

## 2020-10-18 ENCOUNTER — Telehealth: Payer: Self-pay | Admitting: *Deleted

## 2020-10-18 NOTE — Telephone Encounter (Signed)
Thought had a clearance request, but goes to device clinic.

## 2020-10-19 ENCOUNTER — Telehealth: Payer: Self-pay | Admitting: Physician Assistant

## 2020-10-19 NOTE — Telephone Encounter (Signed)
   Primary Cardiologist: Arvilla Meres, MD  Chart reviewed as part of pre-operative protocol coverage. Patient was contacted 10/19/2020 in reference to pre-operative risk assessment for pending surgery as outlined below.  Jason Floyd was last seen on 10/17/20 by Dr. [name].  Since that day, Jason Floyd has done well.  Dr. [name] has medically cleared Jason Floyd for surgery.  Therefore, based on ACC/AHA guidelines, the patient would be at acceptable risk for the planned procedure without further cardiovascular testing.   The patient was advised that if he develops new symptoms prior to surgery to contact our office to arrange for a follow-up visit, and he verbalized understanding.  I will route this recommendation to the requesting party via Epic fax function and remove from pre-op pool. Please call with questions.  Jason Rutherford Ronniesha Seibold, PA 10/19/2020, 6:45 PM

## 2020-10-20 ENCOUNTER — Other Ambulatory Visit: Payer: Self-pay | Admitting: Orthopaedic Surgery

## 2020-10-20 NOTE — Telephone Encounter (Signed)
Called [facility] and LMVM for Jason Floyd to make sure that she had all clearance needed to schedule surgery. Fax via [employer] function to [phone]. Will await call back if anything else is needed.

## 2020-10-21 ENCOUNTER — Other Ambulatory Visit: Payer: Self-pay | Admitting: Family Medicine

## 2020-10-21 NOTE — Telephone Encounter (Signed)
Allopurinol prescribed by historical provider. Please advise.

## 2020-10-24 ENCOUNTER — Encounter: Payer: Self-pay | Admitting: Internal Medicine

## 2020-10-24 NOTE — Patient Instructions (Addendum)
DUE TO COVID-19 ONLY ONE VISITOR IS ALLOWED TO COME WITH YOU AND STAY IN THE WAITING ROOM ONLY DURING PRE OP AND PROCEDURE.   IF YOU WILL BE ADMITTED INTO THE HOSPITAL YOU ARE ALLOWED ONE SUPPORT PERSON DURING VISITATION HOURS ONLY (10AM -8PM)   . The support person may change daily. . The support person must pass our screening, gel in and out, and wear a mask at all times, including in the patient's room. . Patients must also wear a mask when staff or their support person are in the room.   COVID SWAB TESTING MUST BE COMPLETED ON:  Wednesday, 10-26-20 @ 12:55 PM   [address] [place], [place] [address]  (Must self quarantine after testing. Follow instructions on handout.)        Your procedure is scheduled on:  Friday, 10-28-20   Report to [facility] Main  Entrance   Report to admitting at 12:15 PM   Call this number if you have problems the morning of surgery [phone]   Do not eat food :After Midnight.   May have liquids until 11:15 AM day of surgery  CLEAR LIQUID DIET  Foods Allowed                                                                     Foods Excluded  Water, Black Coffee and tea, regular and decaf            liquids that you cannot  Plain Jell-O in any flavor  (No red)                                   see through such as: Fruit ices (not with fruit pulp)                                      milk, soups, orange juice              Iced Popsicles (No red)                                      All solid food                                   Apple juices Sports drinks like Gatorade (No red) Lightly seasoned clear broth or consume(fat free) Sugar, honey syrup    Complete one G2 drink the morning of surgery at  11:15 AM  the day of surgery.   Oral Hygiene is also important to reduce your risk of infection.                                    Remember - BRUSH YOUR TEETH THE MORNING OF SURGERY WITH YOUR REGULAR TOOTHPASTE   Do NOT smoke after  Midnight   Take these medicines the morning of surgery with A  SIP OF WATER:  Amiodarone, Carvedilol, Zyrtec, Fenofibrate, Pantoprazole    WHAT DO I DO ABOUT MY DIABETES MEDICATION?  Marland Kitchen Do not take oral diabetes medicines (pills) the morning of surgery.  . THE DAY BEFORE SURGERY:  Do not take [place].       Take Metformin as prescribed       . THE MORNING OF SURGERY:  Do not take [place] or Metformin.   Reviewed and Endorsed by [facility] Patient Education Committee, August 2015                               You may not have any metal on your body including jewelry, and body piercings             Do not wear lotions, powders, perfumes/cologne, or deodorant             Men may shave face and neck.   Do not bring valuables to the hospital. [facility] IS NOT RESPONSIBLE   FOR VALUABLES.   Contacts, dentures or bridgework may not be worn into surgery.   Patients discharged the day of surgery will not be allowed to drive home.               Please read over the following fact sheets you were given: IF YOU HAVE QUESTIONS ABOUT YOUR PRE OP INSTRUCTIONS PLEASE CALL [phone]   [facility] - Preparing for Surgery Before surgery, you can play an important role.  Because skin is not sterile, your skin needs to be as free of germs as possible.  You can reduce the number of germs on your skin by washing with CHG (chlorahexidine gluconate) soap before surgery.  CHG is an antiseptic cleaner which kills germs and bonds with the skin to continue killing germs even after washing. Please DO NOT use if you have an allergy to CHG or antibacterial soaps.  If your skin becomes reddened/irritated stop using the CHG and inform your nurse when you arrive at Short Stay. Do not shave (including legs and underarms) for at least 48 hours prior to the first CHG shower.  You may shave your face/neck.  Please follow these instructions carefully:  1.  Shower with CHG Soap the night before surgery and the   morning of surgery.  2.  If you choose to wash your hair, wash your hair first as usual with your normal  shampoo.  3.  After you shampoo, rinse your hair and body thoroughly to remove the shampoo.                             4.  Use CHG as you would any other liquid soap.  You can apply chg directly to the skin and wash.  Gently with a scrungie or clean washcloth.  5.  Apply the CHG Soap to your body ONLY FROM THE NECK DOWN.   Do   not use on face/ open                           Wound or open sores. Avoid contact with eyes, ears mouth and   genitals (private parts).                       Wash face,  Genitals (private parts) with your normal soap.  6.  Wash thoroughly, paying special attention to the area where your    surgery  will be performed.  7.  Thoroughly rinse your body with warm water from the neck down.  8.  DO NOT shower/wash with your normal soap after using and rinsing off the CHG Soap.                9.  Pat yourself dry with a clean towel.            10.  Wear clean pajamas.            11.  Place clean sheets on your bed the night of your first shower and do not  sleep with pets. Day of Surgery : Do not apply any lotions/deodorants the morning of surgery.  Please wear clean clothes to the hospital/surgery center.  FAILURE TO FOLLOW THESE INSTRUCTIONS MAY RESULT IN THE CANCELLATION OF YOUR SURGERY  PATIENT SIGNATURE_________________________________  NURSE SIGNATURE__________________________________  ________________________________________________________________________

## 2020-10-24 NOTE — Progress Notes (Unsigned)
PERIOPERATIVE PRESCRIPTION FOR IMPLANTED CARDIAC DEVICE PROGRAMMING  Patient Information: Name:  Jason Floyd  DOB:  12-Jan-1966  MRN:  [id number]  {TIP - You do not have to delete this tip  -  Copy the info from the staff message sent by the PAT staff  then press F2 here and paste the information using CTL - V on the next line :[id number]}  Planned Procedure: R knee arthroscopy  Surgeon: Jerl Santos  Date of Procedure: 10-28-20  Cautery will be used.  Position during surgery:    Please send documentation back to:  [facility] (Fax # [phone])    Device Information:  Clinic EP Physician:  Sherryl Manges, MD   Device Type:  Defibrillator Manufacturer and Phone #:  [employer]: [phone] Pacemaker Dependent?:  No. Date of Last Device Check:  09/26/20 Normal Device Function?:  No.  Electrophysiologist's Recommendations:   Have magnet available.  Provide continuous ECG monitoring when magnet is used or reprogramming is to be performed.   Procedure should not interfere with device function.  No device programming or magnet placement needed.  Per Device Clinic Standing Orders, Lenor Coffin, RN  2:54 PM 10/24/2020

## 2020-10-24 NOTE — Progress Notes (Addendum)
COVID Vaccine Completed:  x2 Date COVID Vaccine completed:  02-03-20 & 02-24-20 COVID vaccine manufacturer: Pfizer    Moderna   [employer]   PCP - Neena Rhymes, MD Cardiologist/Advanced Heart Failure - Arvilla Meres, MD   Chest x-ray - 08-22-20 in Epic EKG - 09-14-20 in Epic Stress Test - 05-02-2007 ECHO - 10-17-20 in Epic Cardiac Cath - 07-01-20 in Epic Pacemaker/ICD device last checked:  09-06-20 in Epic Non-telemetry monitoring - 09-13-20 in Epic Cardiac MRI - 08-03-20 in Epic  Sleep Study -  CPAP -   Fasting Blood Sugar -  Checks Blood Sugar - Do not check  Blood Thinner Instructions:  Eliquis 5 mg Aspirin Instructions:  ASA 81 mg  Last Dose:  Of both 10/26/20   Anesthesia review: CAD, CHF, V-tach, HTN, DM, hx of TIA, MI and PE  Patient denies shortness of breath, fever, cough and chest pain at PAT appointment.  Pt ablel to climb a flight of stairs and perform ADLs independently.   Patient verbalized understanding of instructions that were given to them at the PAT appointment. Patient was also instructed that they will need to review over the PAT instructions again at home before surgery.

## 2020-10-25 ENCOUNTER — Encounter (HOSPITAL_COMMUNITY): Payer: Self-pay

## 2020-10-25 DIAGNOSIS — I5042 Chronic combined systolic (congestive) and diastolic (congestive) heart failure: Secondary | ICD-10-CM

## 2020-10-26 ENCOUNTER — Other Ambulatory Visit (HOSPITAL_COMMUNITY)
Admission: RE | Admit: 2020-10-26 | Discharge: 2020-10-26 | Disposition: A | Discharge status: Home / Self Care | Payer: BC Managed Care – PPO | Source: Ambulatory Visit | Attending: Orthopaedic Surgery | Admitting: Orthopaedic Surgery

## 2020-10-26 ENCOUNTER — Encounter (HOSPITAL_COMMUNITY)
Admission: RE | Admit: 2020-10-26 | Discharge: 2020-10-26 | Disposition: A | Discharge status: Home / Self Care | Payer: BC Managed Care – PPO | Source: Ambulatory Visit | Attending: Orthopaedic Surgery | Admitting: Orthopaedic Surgery

## 2020-10-26 ENCOUNTER — Encounter (HOSPITAL_COMMUNITY): Payer: Self-pay

## 2020-10-26 ENCOUNTER — Other Ambulatory Visit: Payer: Self-pay

## 2020-10-26 DIAGNOSIS — Z86711 Personal history of pulmonary embolism: Secondary | ICD-10-CM | POA: Diagnosis not present

## 2020-10-26 DIAGNOSIS — Z20822 Contact with and (suspected) exposure to covid-19: Secondary | ICD-10-CM | POA: Insufficient documentation

## 2020-10-26 DIAGNOSIS — Z01812 Encounter for preprocedural laboratory examination: Secondary | ICD-10-CM | POA: Insufficient documentation

## 2020-10-26 DIAGNOSIS — X58XXXA Exposure to other specified factors, initial encounter: Secondary | ICD-10-CM | POA: Diagnosis not present

## 2020-10-26 DIAGNOSIS — S83231A Complex tear of medial meniscus, current injury, right knee, initial encounter: Secondary | ICD-10-CM | POA: Diagnosis not present

## 2020-10-26 DIAGNOSIS — Z87891 Personal history of nicotine dependence: Secondary | ICD-10-CM | POA: Diagnosis not present

## 2020-10-26 DIAGNOSIS — M1711 Unilateral primary osteoarthritis, right knee: Secondary | ICD-10-CM | POA: Diagnosis not present

## 2020-10-26 HISTORY — DX: Gastro-esophageal reflux disease without esophagitis: K21.9

## 2020-10-26 HISTORY — DX: Personal history of urinary calculi: Z87.442

## 2020-10-26 HISTORY — DX: Pneumonia, unspecified organism: J18.9

## 2020-10-26 LAB — GLUCOSE, CAPILLARY: Glucose-Capillary: 142 mg/dL — ABNORMAL HIGH (ref 70–99)

## 2020-10-26 LAB — SARS CORONAVIRUS 2 (TAT 6-24 HRS): SARS Coronavirus 2: NEGATIVE

## 2020-10-26 NOTE — Progress Notes (Signed)
Anesthesia Chart Review   Case: [id number] Date/Time: 10/28/20 1403   Procedure: RIGHT KNEE ARTHROSCOPY (Right Knee)   Anesthesia type: Monitor Anesthesia Care   Pre-op diagnosis: RIGHT KNEE MEDIAL MENISCUS TEAR AND DEGENERATIVE JOINT DISEASE   Location: [facility] ROOM 01 / [facility]   Surgeons: Marcene Corning, MD      DISCUSSION:54 y.o. former smoker with h/o GERD, DM II, a-fib (on Eliquis), CHF (Echo 10/17/2020 w/ EF 30-35%), ICD in place (device orders 10/24/20 progress note), right knee medial meniscus tear and djd scheduled for above procedure 10/28/2020 with Dr. Marcene Corning.   Per cardiology preoperative risk assessment 10/19/2020, "Chart reviewed as part of pre-operative protocol coverage. Patient was contacted 10/19/2020 in reference to pre-operative risk assessment for pending surgery as outlined below.  Jason Floyd was last seen on 10/17/20 by Dr. [name].  Since that day, Jason Floyd has done well. Dr. [name] has medically cleared Jason Floyd for surgery. Therefore, based on ACC/AHA guidelines, the patient would be at acceptable risk for the planned procedure without further cardiovascular testing."  Anticipate pt can proceed with planned procedure barring acute status change.   VS: BP 114/74   Pulse 60   Temp 36.6 C (Oral)   Resp 16   Ht 6\' 2"  (1.88 m)   Wt 108.9 kg   SpO2 98%   BMI 30.81 kg/m   PROVIDERS: , MD is PCP   Sheliah Hatch, MD is Cardiologist  LABS: Labs reviewed: Acceptable for surgery. (all labs ordered are listed, but only abnormal results are displayed)  Labs Reviewed  GLUCOSE, CAPILLARY - Abnormal; Notable for the following components:      Result Value   Glucose-Capillary 142 (*)    All other components within normal limits     IMAGES:   EKG: 09/14/2020 Rate 73 bpm  Sinus rhythm with 1st degree A-V block Left anterior fascicular block Cannot rule out Anterior infarct , age undetermined Abnormal ECG Compared to  previous tracing , sinus rhythm has replaced Afib  CV: Echo 10/17/2020 IMPRESSIONS    1. Left ventricular ejection fraction, by estimation, is 30 to 35%. The  left ventricle has moderately decreased function. The left ventricle  demonstrates global hypokinesis. The left ventricular internal cavity size  was mildly to moderately dilated.  Left ventricular diastolic parameters are consistent with Grade I  diastolic dysfunction (impaired relaxation).  2. Right ventricular systolic function is normal. The right ventricular  size is normal.  3. Left atrial size was mildly dilated.  4. The mitral valve is normal in structure. No evidence of mitral valve  regurgitation.  5. The aortic valve is normal in structure. Aortic valve regurgitation is  not visualized.  6. The inferior vena cava is normal in size with greater than 50%  respiratory variability, suggesting right atrial pressure of 3 mmHg.  Past Medical History:  Diagnosis Date  . Allergy   . Arthritis   . CHF (congestive heart failure) (HCC)   . CHF (congestive heart failure) (HCC)   . Diabetes mellitus without complication (HCC)   . Drug abuse (HCC)   . Eczema   . GERD (gastroesophageal reflux disease)   . Gout   . Heart attack (HCC) 06/10/2020  . History of kidney stones   . Hyperlipidemia   . MI (myocardial infarction) (HCC)   . Nephrolithiasis   . PE (pulmonary embolism)   . Pneumonia     Past Surgical History:  Procedure Laterality Date  . CERVICAL FUSION    .  ICD IMPLANT N/A 08/22/2020   Procedure: ICD IMPLANT;  Surgeon: Duke Salvia, MD;  Location: [facility] INVASIVE CV LAB;  Service: Cardiovascular;  Laterality: N/A;  . RIGHT/LEFT HEART CATH AND CORONARY ANGIOGRAPHY N/A 07/01/2020   Procedure: RIGHT/LEFT HEART CATH AND CORONARY ANGIOGRAPHY;  Surgeon: Dolores Patty, MD;  Location: [facility] INVASIVE CV LAB;  Service: Cardiovascular;  Laterality: N/A;  . SHOULDER ARTHROSCOPY Left   . WISDOM TOOTH EXTRACTION       MEDICATIONS: . allopurinol (ZYLOPRIM) 300 MG tablet  . amiodarone (PACERONE) 200 MG tablet  . apixaban (ELIQUIS) 5 MG TABS tablet  . aspirin EC 81 MG tablet  . atorvastatin (LIPITOR) 80 MG tablet  . calcium carbonate (TUMS - DOSED IN MG ELEMENTAL CALCIUM) 500 MG chewable tablet  . carvedilol (COREG) 12.5 MG tablet  . cetirizine (ZYRTEC) 10 MG tablet  . FARXIGA 5 MG TABS tablet  . fenofibrate 160 MG tablet  . furosemide (LASIX) 40 MG tablet  . meloxicam (MOBIC) 15 MG tablet  . metFORMIN (GLUCOPHAGE) 1000 MG tablet  . methocarbamol (ROBAXIN) 500 MG tablet  . Naphazoline HCl (CLEAR EYES OP)  . pantoprazole (PROTONIX) 40 MG tablet  . Potassium Chloride ER 20 MEQ TBCR  . sacubitril-valsartan (ENTRESTO) 49-51 MG  . sodium chloride (OCEAN) 0.65 % SOLN nasal spray  . traZODone (DESYREL) 100 MG tablet  . triamcinolone (KENALOG) 0.025 % ointment   No current facility-administered medications for this encounter.    Jodell Cipro, PA-C [facility] [phone]

## 2020-10-26 NOTE — H&P (Signed)
Jason Floyd is an 54 y.o. male.   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  He is here in referral through Dr. Margaretha Sheffield about his right knee.  He has had trouble for about a year.  He can recall no specific traumatic event.  Running and twisting are painful.  He has intentionally lost about 60 pounds over the last year but persists with pain.  The pain is on the inside aspect.  He takes some meloxicam but really should not as he is also on Eliquis.  This wakes him from sleep.  Imaging/Tests: I reviewed some plain films from the [facility] system dated 04/26/20.  These do show some moderate medial joint space narrowing.  I then reviewed an MRI scan also done through [facility] on 06/22/20.  This shows an impressive medial meniscus tear with some mild to moderate medial compartment degeneration.  Past Medical History:  Diagnosis Date  . Allergy   . Arthritis   . CHF (congestive heart failure) (HCC)   . CHF (congestive heart failure) (HCC)   . Diabetes mellitus without complication (HCC)   . Drug abuse (HCC)   . Eczema   . GERD (gastroesophageal reflux disease)   . Gout   . Heart attack (HCC) 06/10/2020  . History of kidney stones   . Hyperlipidemia   . MI (myocardial infarction) (HCC)   . Nephrolithiasis   . PE (pulmonary embolism)   . Pneumonia     Past Surgical History:  Procedure Laterality Date  . CERVICAL FUSION    . ICD IMPLANT N/A 08/22/2020   Procedure: ICD IMPLANT;  Surgeon: Duke Salvia, MD;  Location: [facility] INVASIVE CV LAB;  Service: Cardiovascular;  Laterality: N/A;  . RIGHT/LEFT HEART CATH AND CORONARY ANGIOGRAPHY N/A 07/01/2020   Procedure: RIGHT/LEFT HEART CATH AND CORONARY ANGIOGRAPHY;  Surgeon: Dolores Patty, MD;  Location: [facility] INVASIVE CV LAB;  Service: Cardiovascular;  Laterality: N/A;  . SHOULDER ARTHROSCOPY Left   . WISDOM TOOTH EXTRACTION      Family History  Problem Relation Age of Onset  . Dementia Mother         frontal-temporal  . Stroke Mother   . Breast cancer Maternal Aunt   . Other Brother        overdose  . Colon cancer Neg Hx    Social History:  reports that he has quit smoking. His smoking use included cigarettes. He has a 7.75 pack-year smoking history. He has never used smokeless tobacco. He reports previous alcohol use. He reports previous drug use. Drug: Marijuana.  Allergies: No Known Allergies  No medications prior to admission.    Results for orders placed or performed during the hospital encounter of 10/26/20 (from the past 48 hour(s))  Glucose, capillary     Status: Abnormal   Collection Time: 10/26/20 11:02 AM  Result Value Ref Range   Glucose-Capillary 142 (H) 70 - 99 mg/dL    Comment: Glucose reference range applies only to samples taken after fasting for at least 8 hours.   No results found.  Review of Systems  Musculoskeletal: Positive for arthralgias.       Right knee  All other systems reviewed and are negative.   There were no vitals taken for this visit. Physical Exam Constitutional:      Appearance: Normal appearance.  HENT:     Head: Normocephalic and atraumatic.     Mouth/Throat:     Pharynx:  Oropharynx is clear.  Eyes:     Extraocular Movements: Extraocular movements intact.  Cardiovascular:     Rate and Rhythm: Normal rate and regular rhythm.     Pulses: Normal pulses.  Pulmonary:     Effort: Pulmonary effort is normal.  Abdominal:     Palpations: Abdomen is soft.  Musculoskeletal:     Comments: Right knee has 1+ effusion.  He has intense medial joint line pain with motion from 0-130.  McMurray's test causes pain.  There is minimal crepitation.  Hip motion is full and straight leg raise is negative.  Sensation and motor function are intact distally palpable pulses on both sides.    Skin:    General: Skin is warm and dry.  Neurological:     General: No focal deficit present.     Mental Status: He is alert and oriented to person, place, and  time.  Psychiatric:        Mood and Affect: Mood normal.        Behavior: Behavior normal.        Thought Content: Thought content normal.        Judgment: Judgment normal.      Assessment/Plan Assessment:  Right knee torn meniscus and moderate DJD by MRI 2021  Plan: I had a talk with LJ about his knee and his scan and his possible treatments.  I told him that he has a mixture of worn and torn cartilage.  He is really in between needing a knee arthroscopy and a knee replacement.  I would lean more towards a knee arthroscopy.  I told him this will address the torn cartilage but cannot replace the worn cartilage.  I suspect this will make him somewhat better and hopefully put off a knee replacement for many years. I reviewed risks of anesthesia and infection as well as potential for DVT related to a knee arthroscopy.  I've stressed the importance of some postoperative physical therapy to optimize results and we will try to set up an appointment.  Two to four weeks for recovery would be typical but that is a little variable.    Ginger Organ Caffie Sotto, PA-C 10/26/2020, 5:42 PM

## 2020-10-26 NOTE — Anesthesia Preprocedure Evaluation (Addendum)
Anesthesia Evaluation  Patient identified by MRN, date of birth, ID band Patient awake    Reviewed: Allergy & Precautions, NPO status , Patient's Chart, lab work & pertinent test results  History of Anesthesia Complications Negative for: history of anesthetic complications  Airway Mallampati: II  TM Distance: >3 FB Neck ROM: Full    Dental  (+) Teeth Intact   Pulmonary former smoker, PE   Pulmonary exam normal        Cardiovascular hypertension, + CAD and +CHF  Normal cardiovascular exam+ dysrhythmias Atrial Fibrillation and Ventricular Tachycardia + Cardiac Defibrillator      Neuro/Psych TIAnegative psych ROS   GI/Hepatic GERD  ,(+)     substance abuse  ,   Endo/Other  diabetes, Type 2  Renal/GU negative Renal ROS  negative genitourinary   Musculoskeletal  (+) Arthritis ,   Abdominal   Peds  Hematology Eliquis   Anesthesia Other Findings  Echo 10/17/20: EF 30-35%, global hypokinesis, g1dd, normal RV function, valves unremarkable  Per cardiology preoperative risk assessment 10/19/2020, "Chart reviewed as part of pre-operative protocol coverage. Patient was contacted12/8/2021in reference to pre-operative risk assessment for pending surgery as outlined below. Beacher May last seen on 12/6/21by Dr. [name]. Since that day, Lacharles Altschuler done well. Dr. [name] has medically cleared Mr. Summons for surgery. Therefore, based on ACC/AHA guidelines, the patient would be at acceptable risk for the planned procedure without further cardiovascular testing."  Reproductive/Obstetrics                         Anesthesia Physical Anesthesia Plan  ASA: IV  Anesthesia Plan: General   Post-op Pain Management:    Induction: Intravenous  PONV Risk Score and Plan: 2 and Ondansetron, Dexamethasone, Midazolam and Treatment may vary due to age or medical condition  Airway Management  Planned: LMA  Additional Equipment: None  Intra-op Plan:   Post-operative Plan: Extubation in OR  Informed Consent: I have reviewed the patients History and Physical, chart, labs and discussed the procedure including the risks, benefits and alternatives for the proposed anesthesia with the patient or authorized representative who has indicated his/her understanding and acceptance.     Dental advisory given  Plan Discussed with:   Anesthesia Plan Comments: (See PAT note 10/26/2020, Jodell Cipro, PA-C)       Anesthesia Quick Evaluation

## 2020-10-27 MED ORDER — SPIRONOLACTONE 25 MG PO TABS: 25.0000 mg | ORAL_TABLET | Freq: Every day | ORAL | 3 refills | Status: DC

## 2020-10-27 NOTE — Telephone Encounter (Signed)
Pt aware and agreeable with plan. Lab appt scheduled. Spironolactone sent to pharmacy.

## 2020-10-28 ENCOUNTER — Encounter (HOSPITAL_COMMUNITY): Payer: Self-pay | Admitting: Orthopaedic Surgery

## 2020-10-28 ENCOUNTER — Encounter (HOSPITAL_COMMUNITY)
Admission: RE | Disposition: A | Discharge status: Home / Self Care | Payer: Self-pay | Source: Ambulatory Visit | Attending: Orthopaedic Surgery

## 2020-10-28 ENCOUNTER — Other Ambulatory Visit: Payer: Self-pay

## 2020-10-28 ENCOUNTER — Ambulatory Visit (HOSPITAL_COMMUNITY): Payer: BC Managed Care – PPO | Admitting: Anesthesiology

## 2020-10-28 ENCOUNTER — Ambulatory Visit (HOSPITAL_COMMUNITY)
Admission: RE | Admit: 2020-10-28 | Discharge: 2020-10-28 | Disposition: A | Discharge status: Home / Self Care | Payer: BC Managed Care – PPO | Source: Ambulatory Visit | Attending: Orthopaedic Surgery | Admitting: Orthopaedic Surgery

## 2020-10-28 ENCOUNTER — Ambulatory Visit (HOSPITAL_COMMUNITY): Payer: BC Managed Care – PPO | Admitting: Physician Assistant

## 2020-10-28 ENCOUNTER — Telehealth (HOSPITAL_COMMUNITY): Payer: Self-pay | Admitting: *Deleted

## 2020-10-28 DIAGNOSIS — M1711 Unilateral primary osteoarthritis, right knee: Secondary | ICD-10-CM | POA: Insufficient documentation

## 2020-10-28 DIAGNOSIS — S83231A Complex tear of medial meniscus, current injury, right knee, initial encounter: Secondary | ICD-10-CM | POA: Diagnosis not present

## 2020-10-28 DIAGNOSIS — Z86711 Personal history of pulmonary embolism: Secondary | ICD-10-CM | POA: Insufficient documentation

## 2020-10-28 DIAGNOSIS — Z87891 Personal history of nicotine dependence: Secondary | ICD-10-CM | POA: Insufficient documentation

## 2020-10-28 DIAGNOSIS — Z20822 Contact with and (suspected) exposure to covid-19: Secondary | ICD-10-CM | POA: Insufficient documentation

## 2020-10-28 DIAGNOSIS — X58XXXA Exposure to other specified factors, initial encounter: Secondary | ICD-10-CM | POA: Insufficient documentation

## 2020-10-28 HISTORY — PX: KNEE ARTHROSCOPY: SHX127

## 2020-10-28 LAB — GLUCOSE, CAPILLARY
Glucose-Capillary: 149 mg/dL — ABNORMAL HIGH (ref 70–99)
Glucose-Capillary: 110 mg/dL — ABNORMAL HIGH (ref 70–99)

## 2020-10-28 SURGERY — ARTHROSCOPY, KNEE
Anesthesia: General | Site: Knee | Laterality: Right

## 2020-10-28 MED ORDER — ONDANSETRON HCL 4 MG/2ML IJ SOLN: 4.0000 mg | Freq: Once | INTRAMUSCULAR | Status: DC | PRN

## 2020-10-28 MED ORDER — LACTATED RINGERS IV SOLN: INTRAVENOUS | Status: DC

## 2020-10-28 MED ORDER — FENTANYL CITRATE (PF) 100 MCG/2ML IJ SOLN: 25.0000 ug | INTRAMUSCULAR | Status: DC | PRN

## 2020-10-28 MED ORDER — FENTANYL CITRATE (PF) 100 MCG/2ML IJ SOLN
50.0000 ug | Freq: Once | INTRAMUSCULAR | Status: DC
  Filled 2020-10-28: qty 2

## 2020-10-28 MED ORDER — SODIUM CHLORIDE 0.9 % IR SOLN
Status: DC | PRN
  Administered 2020-10-28: 3000 mL

## 2020-10-28 MED ORDER — OXYCODONE HCL 5 MG PO TABS
ORAL_TABLET | ORAL | Status: AC
  Filled 2020-10-28: qty 1

## 2020-10-28 MED ORDER — CHLORHEXIDINE GLUCONATE 0.12 % MT SOLN
15.0000 mL | Freq: Once | OROMUCOSAL | Status: AC
  Administered 2020-10-28: 15 mL via OROMUCOSAL

## 2020-10-28 MED ORDER — PROPOFOL 10 MG/ML IV BOLUS
INTRAVENOUS | Status: AC
  Filled 2020-10-28: qty 20

## 2020-10-28 MED ORDER — LIDOCAINE 2% (20 MG/ML) 5 ML SYRINGE
INTRAMUSCULAR | Status: DC | PRN
  Administered 2020-10-28: 60 mg via INTRAVENOUS

## 2020-10-28 MED ORDER — CEFAZOLIN SODIUM-DEXTROSE 2-4 GM/100ML-% IV SOLN
2.0000 g | INTRAVENOUS | Status: AC
  Administered 2020-10-28: 2 g via INTRAVENOUS
  Filled 2020-10-28: qty 100

## 2020-10-28 MED ORDER — METHYLPREDNISOLONE ACETATE 40 MG/ML IJ SUSP
INTRAMUSCULAR | Status: AC
  Filled 2020-10-28: qty 2

## 2020-10-28 MED ORDER — MIDAZOLAM HCL 2 MG/2ML IJ SOLN
1.0000 mg | Freq: Once | INTRAMUSCULAR | Status: DC
  Filled 2020-10-28: qty 2

## 2020-10-28 MED ORDER — OXYCODONE HCL 5 MG PO TABS
5.0000 mg | ORAL_TABLET | Freq: Once | ORAL | Status: AC | PRN
  Administered 2020-10-28: 5 mg via ORAL

## 2020-10-28 MED ORDER — ONDANSETRON HCL 4 MG/2ML IJ SOLN
INTRAMUSCULAR | Status: DC | PRN
  Administered 2020-10-28: 4 mg via INTRAVENOUS

## 2020-10-28 MED ORDER — HYDROCODONE-ACETAMINOPHEN 5-325 MG PO TABS: 1.0000 | ORAL_TABLET | Freq: Four times a day (QID) | ORAL | 0 refills | Status: DC | PRN

## 2020-10-28 MED ORDER — BUPIVACAINE-EPINEPHRINE (PF) 0.5% -1:200000 IJ SOLN
INTRAMUSCULAR | Status: AC
  Filled 2020-10-28: qty 30

## 2020-10-28 MED ORDER — LIDOCAINE HCL (PF) 2 % IJ SOLN
INTRAMUSCULAR | Status: AC
  Filled 2020-10-28: qty 5

## 2020-10-28 MED ORDER — PROPOFOL 10 MG/ML IV BOLUS
INTRAVENOUS | Status: DC | PRN
  Administered 2020-10-28: 50 mg via INTRAVENOUS
  Administered 2020-10-28: 100 mg via INTRAVENOUS

## 2020-10-28 MED ORDER — PHENYLEPHRINE HCL (PRESSORS) 10 MG/ML IV SOLN
INTRAVENOUS | Status: AC
  Filled 2020-10-28: qty 1

## 2020-10-28 MED ORDER — DEXAMETHASONE SODIUM PHOSPHATE 10 MG/ML IJ SOLN
INTRAMUSCULAR | Status: DC | PRN
  Administered 2020-10-28: 4 mg via INTRAVENOUS

## 2020-10-28 MED ORDER — DEXAMETHASONE SODIUM PHOSPHATE 10 MG/ML IJ SOLN
INTRAMUSCULAR | Status: AC
  Filled 2020-10-28: qty 1

## 2020-10-28 MED ORDER — ONDANSETRON HCL 4 MG/2ML IJ SOLN
INTRAMUSCULAR | Status: AC
  Filled 2020-10-28: qty 2

## 2020-10-28 MED ORDER — BUPIVACAINE-EPINEPHRINE 0.5% -1:200000 IJ SOLN
INTRAMUSCULAR | Status: DC | PRN
  Administered 2020-10-28: 20 mL

## 2020-10-28 MED ORDER — EPHEDRINE SULFATE-NACL 50-0.9 MG/10ML-% IV SOSY
PREFILLED_SYRINGE | INTRAVENOUS | Status: DC | PRN
  Administered 2020-10-28: 10 mg via INTRAVENOUS

## 2020-10-28 MED ORDER — ORAL CARE MOUTH RINSE: 15.0000 mL | Freq: Once | OROMUCOSAL | Status: AC

## 2020-10-28 MED ORDER — AMISULPRIDE (ANTIEMETIC) 5 MG/2ML IV SOLN: 10.0000 mg | Freq: Once | INTRAVENOUS | Status: DC | PRN

## 2020-10-28 MED ORDER — METHYLPREDNISOLONE ACETATE 80 MG/ML IJ SUSP
INTRAMUSCULAR | Status: DC | PRN
  Administered 2020-10-28: 80 mg

## 2020-10-28 MED ORDER — FENTANYL CITRATE (PF) 100 MCG/2ML IJ SOLN
INTRAMUSCULAR | Status: DC | PRN
  Administered 2020-10-28 (×2): 25 ug via INTRAVENOUS

## 2020-10-28 MED ORDER — FENTANYL CITRATE (PF) 100 MCG/2ML IJ SOLN
INTRAMUSCULAR | Status: AC
  Filled 2020-10-28: qty 2

## 2020-10-28 MED ORDER — EPHEDRINE 5 MG/ML INJ
INTRAVENOUS | Status: AC
  Filled 2020-10-28: qty 10

## 2020-10-28 MED ORDER — OXYCODONE HCL 5 MG/5ML PO SOLN: 5.0000 mg | Freq: Once | ORAL | Status: AC | PRN

## 2020-10-28 SURGICAL SUPPLY — 31 items
BLADE EXCALIBUR 4.0X13 (MISCELLANEOUS) IMPLANT
BNDG ELASTIC 6X5.8 VLCR STR LF (GAUZE/BANDAGES/DRESSINGS) ×2 IMPLANT
BNDG GAUZE ELAST 4 BULKY (GAUZE/BANDAGES/DRESSINGS) ×2 IMPLANT
COVER SURGICAL LIGHT HANDLE (MISCELLANEOUS) IMPLANT
COVER WAND RF STERILE (DRAPES) ×2 IMPLANT
DISSECTOR 3.5MM X 13CM (MISCELLANEOUS) ×2 IMPLANT
DRAPE ARTHROSCOPY W/POUCH 114 (DRAPES) ×2 IMPLANT
DRAPE SHEET LG 3/4 BI-LAMINATE (DRAPES) ×2 IMPLANT
DRAPE U-SHAPE 47X51 STRL (DRAPES) ×2 IMPLANT
DRSG EMULSION OIL 3X3 NADH (GAUZE/BANDAGES/DRESSINGS) ×2 IMPLANT
DRSG PAD ABDOMINAL 8X10 ST (GAUZE/BANDAGES/DRESSINGS) ×3 IMPLANT
DURAPREP 26ML APPLICATOR (WOUND CARE) ×4 IMPLANT
GAUZE SPONGE 4X4 12PLY STRL (GAUZE/BANDAGES/DRESSINGS) ×2 IMPLANT
GLOVE BIO SURGEON STRL SZ8 (GLOVE) ×4 IMPLANT
GLOVE BIOGEL PI IND STRL 8 (GLOVE) ×2 IMPLANT
GLOVE BIOGEL PI INDICATOR 8 (GLOVE) ×2
GOWN STRL REUS W/ TWL XL LVL3 (GOWN DISPOSABLE) ×2 IMPLANT
GOWN STRL REUS W/TWL XL LVL3 (GOWN DISPOSABLE) ×4
KIT BASIN OR (CUSTOM PROCEDURE TRAY) ×2 IMPLANT
KIT TURNOVER KIT A (KITS) ×2 IMPLANT
MANIFOLD NEPTUNE II (INSTRUMENTS) ×2 IMPLANT
NDL SPNL 18GX3.5 QUINCKE PK (NEEDLE) IMPLANT
NEEDLE HYPO 22GX1.5 SAFETY (NEEDLE) ×2 IMPLANT
NEEDLE SPNL 18GX3.5 QUINCKE PK (NEEDLE) IMPLANT
PACK ARTHROSCOPY WL (CUSTOM PROCEDURE TRAY) ×2 IMPLANT
PAD ARMBOARD 7.5X6 YLW CONV (MISCELLANEOUS) ×4 IMPLANT
PENCIL SMOKE EVACUATOR (MISCELLANEOUS) IMPLANT
SYR CONTROL 10ML LL (SYRINGE) ×2 IMPLANT
TOWEL OR 17X26 10 PK STRL BLUE (TOWEL DISPOSABLE) ×2 IMPLANT
TUBING ARTHROSCOPY IRRIG 16FT (MISCELLANEOUS) ×2 IMPLANT
WATER STERILE IRR 1000ML POUR (IV SOLUTION) ×2 IMPLANT

## 2020-10-28 NOTE — Transfer of Care (Signed)
Immediate Anesthesia Transfer of Care Note  Patient: Jason Floyd  Procedure(s) Performed: RIGHT KNEE ARTHROSCOPY (Right Knee)  Patient Location: PACU  Anesthesia Type:General  Level of Consciousness: awake, alert  and oriented  Airway & Oxygen Therapy: Patient Spontanous Breathing and Patient connected to face mask oxygen  Post-op Assessment: Report given to RN, Post -op Vital signs reviewed and stable and Patient moving all extremities X 4  Post vital signs: Reviewed and stable  Last Vitals:  Vitals Value Taken Time  BP    Temp    Pulse 60 10/28/20 1424  Resp    SpO2 100 % 10/28/20 1424  Vitals shown include unvalidated device data.  Last Pain:  Vitals:   10/28/20 1241  TempSrc:   PainSc: 0-No pain         Complications: No complications documented.

## 2020-10-28 NOTE — Op Note (Signed)
Christipher Rieger [id number] 10/28/2020   PRE-OP DIAGNOSIS: right TMM and DJD  POST-OP DIAGNOSIS: same  PROCEDURE: right PMM and CP  ANESTHESIA: general  Velna Ochs   Dictation #:  [id number]

## 2020-10-28 NOTE — Anesthesia Procedure Notes (Signed)
Procedure Name: LMA Insertion Date/Time: 10/28/2020 1:45 PM Performed by: Nelle Don, CRNA Pre-anesthesia Checklist: Patient identified, Emergency Drugs available, Suction available and Patient being monitored Patient Re-evaluated:Patient Re-evaluated prior to induction Oxygen Delivery Method: Circle system utilized Preoxygenation: Pre-oxygenation with 100% oxygen Induction Type: IV induction LMA: LMA inserted LMA Size: 5.0 Number of attempts: 1 Dental Injury: Teeth and Oropharynx as per pre-operative assessment

## 2020-10-28 NOTE — Interval H&P Note (Signed)
History and Physical Interval Note:  10/28/2020 1:10 PM  Jason Floyd  has presented today for surgery, with the diagnosis of RIGHT KNEE MEDIAL MENISCUS TEAR AND DEGENERATIVE JOINT DISEASE.  The various methods of treatment have been discussed with the patient and family. After consideration of risks, benefits and other options for treatment, the patient has consented to  Procedure(s): RIGHT KNEE ARTHROSCOPY (Right) as a surgical intervention.  The patient's history has been reviewed, patient examined, no change in status, stable for surgery.  I have reviewed the patient's chart and labs.  Questions were answered to the patient's satisfaction.     Velna Ochs

## 2020-10-28 NOTE — Anesthesia Postprocedure Evaluation (Signed)
Anesthesia Post Note  Patient: Jason Floyd  Procedure(s) Performed: RIGHT KNEE ARTHROSCOPY, PARTIAL MEDIAL MENISCECTOMY, CHONDROPLASTY (Right Knee)     Patient location during evaluation: PACU Anesthesia Type: General Level of consciousness: awake and alert Pain management: pain level controlled Vital Signs Assessment: post-procedure vital signs reviewed and stable Respiratory status: spontaneous breathing, nonlabored ventilation and respiratory function stable Cardiovascular status: blood pressure returned to baseline and stable Postop Assessment: no apparent nausea or vomiting Anesthetic complications: no   No complications documented.  Last Vitals:  Vitals:   10/28/20 1453 10/28/20 1505  BP:  123/76  Pulse: (!) 59 (!) 58  Resp: 17 18  Temp:  36.6 C  SpO2: 99% 100%    Last Pain:  Vitals:   10/28/20 1505  TempSrc:   PainSc: 5                  Lucretia Kern

## 2020-10-29 ENCOUNTER — Encounter (HOSPITAL_COMMUNITY): Payer: Self-pay | Admitting: Orthopaedic Surgery

## 2020-10-29 NOTE — Op Note (Signed)
NAMEMATAIO, Jason Floyd MEDICAL RECORD [id number] ACCOUNT [id number] DATE OF BIRTH:1966/11/08 FACILITY: [facility] LOCATION: [facility] PHYSICIAN:Jerriah Ines Janann Colonel, MD  OPERATIVE REPORT  DATE OF PROCEDURE:  10/28/2020  PREOPERATIVE DIAGNOSES: 1.  Right knee torn medial meniscus. 2.  Right knee degenerative joint disease.  POSTOPERATIVE DIAGNOSES: 1.  Right knee torn medial meniscus. 2.  Right knee degenerative joint disease.  PROCEDURE: 1.  Right knee partial medial meniscectomy. 2.  Right knee abrasion chondroplasty, medial and patellofemoral.  ANESTHESIA:  General.  ATTENDING SURGEON:  Marcene Corning, MD.  ASSISTANT:  Elodia Florence, PA.  INDICATIONS:  The patient is a 54 year old man with a long history of right knee pain.  This has persisted despite multiple conservative measures.  By MRI scan, he has a significant medial meniscus tear and some degenerative change.  He has pain, which  limits his ability to rest and walk and he is offered an arthroscopy.  Informed operative consent was obtained after discussion of possible complications including reaction to anesthesia, infection, DVT.  SUMMARY OF FINDINGS AND PROCEDURE:  Under general anesthesia, an arthroscopy of the right knee was performed.  Suprapatellar pouch was benign, but the patellofemoral joint exhibited some breakdown through the intertrochlear groove addressed with a  chondroplasty back to bleeding bone and some small areas.  In the medial compartment, he had a broader areas of grade IV change with focal change, grade IV also along the medial tibial plateau.  He had a complex posterior horn medial meniscus tear  addressed with about a 10% partial medial meniscectomy.  A thorough chondroplasty was also done in this compartment.  ACL looked normal and the lateral compartment was completely benign.  He was scheduled to go home the same day.  DESCRIPTION OF PROCEDURE:  The patient was taken to the operating suite where general  anesthetic was applied without difficulty.  He was positioned supine and prepped and draped in normal sterile fashion.  After the administration of preoperative IV  Kefzol and appropriate time-out, an arthroscopy of the right knee was performed through a total of 2 portals.  Findings were as noted above and procedure consisted predominantly of the partial medial meniscectomy, which was done with a basket and shaver  back to stable tissues.  We also performed the abrasion and chondroplasty as described above.  The knee was thoroughly irrigated, followed by removal of arthroscopic equipment.  Adaptic was placed over the portals followed by dry gauze and a loose Ace  wrap.  Estimated blood loss and intraoperative fluids can be obtained from anesthesia records.  DISPOSITION:  The patient was extubated in the operating room and taken to recovery room in stable condition.  Plans were for him to go home the same day and follow up in the office next week.  I will contact him by phone tonight.  IN/NUANCE  D:10/28/2020 T:10/29/2020 JOB:[id number]

## 2020-11-03 ENCOUNTER — Other Ambulatory Visit: Payer: Self-pay

## 2020-11-03 ENCOUNTER — Ambulatory Visit (HOSPITAL_COMMUNITY)
Admission: RE | Admit: 2020-11-03 | Discharge: 2020-11-03 | Disposition: A | Discharge status: Home / Self Care | Payer: BC Managed Care – PPO | Source: Ambulatory Visit | Attending: Internal Medicine | Admitting: Internal Medicine

## 2020-11-03 DIAGNOSIS — I5042 Chronic combined systolic (congestive) and diastolic (congestive) heart failure: Secondary | ICD-10-CM | POA: Insufficient documentation

## 2020-11-03 LAB — BASIC METABOLIC PANEL
Anion gap: 10 (ref 5–15)
BUN: 26 mg/dL — ABNORMAL HIGH (ref 6–20)
CO2: 25 mmol/L (ref 22–32)
Calcium: 9.7 mg/dL (ref 8.9–10.3)
Chloride: 101 mmol/L (ref 98–111)
Creatinine, Ser: 1.33 mg/dL — ABNORMAL HIGH (ref 0.61–1.24)
GFR, Estimated: >60 mL/min (ref >60)
Glucose, Bld: 144 mg/dL — ABNORMAL HIGH (ref 70–99)
Potassium: 4.5 mmol/L (ref 3.5–5.1)
Sodium: 136 mmol/L (ref 135–145)

## 2020-11-08 ENCOUNTER — Other Ambulatory Visit: Payer: Self-pay | Admitting: Family Medicine

## 2020-11-08 ENCOUNTER — Other Ambulatory Visit (HOSPITAL_COMMUNITY): Payer: Self-pay | Admitting: Internal Medicine

## 2020-11-10 ENCOUNTER — Other Ambulatory Visit: Payer: Self-pay

## 2020-11-10 ENCOUNTER — Ambulatory Visit (HOSPITAL_COMMUNITY)
Admission: RE | Admit: 2020-11-10 | Discharge: 2020-11-10 | Disposition: A | Discharge status: Home / Self Care | Payer: BC Managed Care – PPO | Source: Ambulatory Visit | Attending: Internal Medicine | Admitting: Internal Medicine

## 2020-11-10 DIAGNOSIS — R42 Dizziness and giddiness: Secondary | ICD-10-CM

## 2020-11-21 ENCOUNTER — Other Ambulatory Visit (HOSPITAL_COMMUNITY): Payer: Self-pay | Admitting: Internal Medicine

## 2020-11-21 ENCOUNTER — Telehealth (HOSPITAL_COMMUNITY): Payer: Self-pay | Admitting: Pharmacy Technician

## 2020-11-21 NOTE — Telephone Encounter (Signed)
Patient Advocate Encounter   Received notification from [employer] that prior authorization for Eliquis is required.   PA submitted on CoverMyMeds Key [id number]  Status is pending   Will continue to follow.

## 2020-11-24 ENCOUNTER — Ambulatory Visit (INDEPENDENT_AMBULATORY_CARE_PROVIDER_SITE_OTHER): Payer: BC Managed Care – PPO

## 2020-11-24 DIAGNOSIS — I472 Ventricular tachycardia, unspecified: Secondary | ICD-10-CM

## 2020-11-24 LAB — CUP PACEART REMOTE DEVICE CHECK
Battery Remaining Longevity: 156 mo
Battery Remaining Percentage: 100 %
Brady Statistic RA Percent Paced: 0 %
Brady Statistic RV Percent Paced: 0 %
Date Time Interrogation Session: 20220113004100
HighPow Impedance: 76 Ohm
Implantable Lead Implant Date: 20211011
Implantable Lead Implant Date: 20211011
Implantable Lead Location: 753859
Implantable Lead Location: 753860
Implantable Lead Model: 138
Implantable Lead Model: 7841
Implantable Lead Serial Number: 1097338
Implantable Lead Serial Number: 303261
Implantable Pulse Generator Implant Date: 20211011
Lead Channel Impedance Value: 581 Ohm
Lead Channel Impedance Value: 644 Ohm
Lead Channel Setting Pacing Amplitude: 3.5 V
Lead Channel Setting Pacing Pulse Width: 0.4 ms
Lead Channel Setting Sensing Sensitivity: 0.5 mV
Pulse Gen Serial Number: 228262

## 2020-11-25 NOTE — Telephone Encounter (Signed)
The patient's PA for Eliquis has been denied. I was able to activate him a $10 co-pay card to be able to use.   BIN [id number] PCN  [employer] [id number] ID [id number]  Called and spoke with the patient. Emailed him the card information to give to his pharmacy.  Archer Asa, CPhT

## 2020-11-30 ENCOUNTER — Other Ambulatory Visit: Payer: Self-pay | Admitting: Family Medicine

## 2020-12-05 ENCOUNTER — Encounter (HOSPITAL_COMMUNITY): Payer: Self-pay

## 2020-12-05 ENCOUNTER — Telehealth (HOSPITAL_COMMUNITY): Payer: Self-pay | Admitting: Licensed Clinical Social Worker

## 2020-12-05 DIAGNOSIS — Z95810 Presence of automatic (implantable) cardiac defibrillator: Secondary | ICD-10-CM | POA: Insufficient documentation

## 2020-12-05 NOTE — Telephone Encounter (Signed)
CSW consulted to help get pt a new PCP.  CSW emailed pt list of in network PCPs in pt area but pt would like recommendation from the physician directly if possible.  CSW sent message to provider to inquire.  Will continue to follow and assist as needed  Burna Sis, LCSW Clinical Social Worker [facility] Desk#: [phone] Cell#: [phone]

## 2020-12-06 ENCOUNTER — Ambulatory Visit: Payer: BC Managed Care – PPO | Admitting: Internal Medicine

## 2020-12-06 ENCOUNTER — Other Ambulatory Visit: Payer: Self-pay

## 2020-12-06 ENCOUNTER — Encounter: Payer: Self-pay | Admitting: Internal Medicine

## 2020-12-06 VITALS — BP 108/70 | HR 56 | Ht 74.0 in | Wt 236.8 lb

## 2020-12-06 DIAGNOSIS — E032 Hypothyroidism due to medicaments and other exogenous substances: Secondary | ICD-10-CM

## 2020-12-06 DIAGNOSIS — I5042 Chronic combined systolic (congestive) and diastolic (congestive) heart failure: Secondary | ICD-10-CM

## 2020-12-06 DIAGNOSIS — Z79899 Other long term (current) drug therapy: Secondary | ICD-10-CM

## 2020-12-06 DIAGNOSIS — I2510 Atherosclerotic heart disease of native coronary artery without angina pectoris: Secondary | ICD-10-CM

## 2020-12-06 DIAGNOSIS — I472 Ventricular tachycardia, unspecified: Secondary | ICD-10-CM

## 2020-12-06 DIAGNOSIS — Z95810 Presence of automatic (implantable) cardiac defibrillator: Secondary | ICD-10-CM | POA: Diagnosis not present

## 2020-12-06 NOTE — Progress Notes (Signed)
Patient Care Team: Sheliah Hatch, MD as PCP - General (Family Medicine) Bensimhon, Bevelyn Buckles, MD as PCP - Advanced Heart Failure (Cardiology) Bensimhon, Bevelyn Buckles, MD as PCP - Cardiology (Cardiology)   HPI  Jason Floyd is a 55 y.o. male seen in follow-up for an ICD implanted for secondary prevention 10/21 for ventricular tachycardia in the setting of ischemic heart disease chronic congestive heart failure persistent atrial fibrillation and a prior TIA.  Because of recurrent ventricular tachycardia he was started on amiodarone with hopes thereafter of cardioversion for atrial fibrillation--spontaneously converted.  Tolerating amiodarone without cough nausea  Mild dyspnea on exertion.  No chest pain.  DATE TEST EF   8/21 LHC 25-30  % LADp-T; LCx-20;RCA-30  8/21 cMRI 26% LGE>> transmural MI  12/21 Echo  30-35%    Date Cr K Hgb TSH LFTs  8/21    2.83 21/57  12/21 1.33 4.5 15.9                Records and Results Reviewed   Past Medical History:  Diagnosis Date  . Allergy   . Arthritis   . CHF (congestive heart failure) (HCC)   . CHF (congestive heart failure) (HCC)   . Diabetes mellitus without complication (HCC)   . Drug abuse (HCC)   . Eczema   . GERD (gastroesophageal reflux disease)   . Gout   . Heart attack (HCC) 06/10/2020  . History of kidney stones   . Hyperlipidemia   . MI (myocardial infarction) (HCC)   . Nephrolithiasis   . PE (pulmonary embolism)   . Pneumonia     Past Surgical History:  Procedure Laterality Date  . CERVICAL FUSION    . ICD IMPLANT N/A 08/22/2020   Procedure: ICD IMPLANT;  Surgeon: Duke Salvia, MD;  Location: [facility] INVASIVE CV LAB;  Service: Cardiovascular;  Laterality: N/A;  . KNEE ARTHROSCOPY Right 10/28/2020   Procedure: RIGHT KNEE ARTHROSCOPY, PARTIAL MEDIAL MENISCECTOMY, CHONDROPLASTY;  Surgeon: Marcene Corning, MD;  Location: [facility] ORS;  Service: Orthopedics;  Laterality: Right;  . RIGHT/LEFT HEART CATH AND CORONARY  ANGIOGRAPHY N/A 07/01/2020   Procedure: RIGHT/LEFT HEART CATH AND CORONARY ANGIOGRAPHY;  Surgeon: Dolores Patty, MD;  Location: [facility] INVASIVE CV LAB;  Service: Cardiovascular;  Laterality: N/A;  . SHOULDER ARTHROSCOPY Left   . WISDOM TOOTH EXTRACTION      Current Meds  Medication Sig  . allopurinol (ZYLOPRIM) 300 MG tablet TAKE 2 TABLETS BY MOUTH DAILY.  Marland Kitchen amiodarone (PACERONE) 200 MG tablet Take 1 tablet (200 mg total) by mouth daily.  Marland Kitchen apixaban (ELIQUIS) 5 MG TABS tablet Take 5 mg by mouth 2 (two) times daily.  Marland Kitchen aspirin EC 81 MG tablet Take 81 mg by mouth daily. Swallow whole.  Marland Kitchen atorvastatin (LIPITOR) 80 MG tablet Take 1 tablet (80 mg total) by mouth daily. (Patient taking differently: Take 80 mg by mouth at bedtime.)  . calcium carbonate (TUMS - DOSED IN MG ELEMENTAL CALCIUM) 500 MG chewable tablet Chew 2-3 tablets by mouth daily as needed for indigestion or heartburn.  . carvedilol (COREG) 12.5 MG tablet Take 1.5 tablets (18.75 mg total) by mouth 2 (two) times daily with a meal.  . cetirizine (ZYRTEC) 10 MG tablet Take 10 mg by mouth daily.  Marland Kitchen FARXIGA 5 MG TABS tablet TAKE 1 TABLET BY MOUTH DAILY (Patient taking differently: Take 5 mg by mouth at bedtime.)  . fenofibrate 160 MG tablet TAKE 1 TABLET BY MOUTH DAILY.  Marland Kitchen  HYDROcodone-acetaminophen (NORCO/VICODIN) 5-325 MG tablet Take 1-2 tablets by mouth every 6 (six) hours as needed for moderate pain or severe pain (post op pain).  . meloxicam (MOBIC) 15 MG tablet TAKE 1 TABLET BY MOUTH DAILY AS NEEDED FOR PAIN.  . metFORMIN (GLUCOPHAGE) 1000 MG tablet TAKE 1 TABLET BY MOUTH 2 TIMES DAILY WITH A MEAL.  . methocarbamol (ROBAXIN) 500 MG tablet Take 1 tablet (500 mg total) by mouth at bedtime.  . Naphazoline HCl (CLEAR EYES OP) Place 1 drop into both eyes daily as needed (sore eyes).  . pantoprazole (PROTONIX) 40 MG tablet TAKE 1 TABLET BY MOUTH DAILY.  . sacubitril-valsartan (ENTRESTO) 49-51 MG Take 1 tablet by mouth 2 (two) times daily.   . sodium chloride (OCEAN) 0.65 % SOLN nasal spray Place 1 spray into both nostrils as needed (dryness).  Marland Kitchen spironolactone (ALDACTONE) 25 MG tablet Take 1 tablet (25 mg total) by mouth daily.  . traZODone (DESYREL) 100 MG tablet TAKE 1 TABLET BY MOUTH AT BEDTIME. (Patient taking differently: Take 100 mg by mouth at bedtime.)  . triamcinolone (KENALOG) 0.025 % ointment APPLY TO THE AFFECTED AREA 2 TIMES DAILY. (Patient taking differently: Apply 1 application topically daily as needed (Dry Skin).)    No Known Allergies    Review of Systems negative except from HPI and PMH  Physical Exam BP 108/70   Pulse (!) 56   Ht 6\' 2"  (1.88 m)   Wt 236 lb 12.8 oz (107.4 kg)   SpO2 96%   BMI 30.40 kg/m  Well developed and well nourished in no acute distress HENT normal E scleral and icterus clear Neck Supple JVP flat; carotids brisk and full Clear to ausculation Device pocket well healed; without hematoma or erythema.  There is no tethering  Regular rate and rhythm, no murmurs gallops or rub Soft with active bowel sounds No clubbing cyanosis  Edema Alert and oriented, grossly normal motor and sensory function Skin Warm and Dry  ECG sinus @ 56 24/11/51  CrCl cannot be calculated (Patient's most recent lab result is older than the maximum 21 days allowed.).   Assessment and  Plan  Ischemic cardiomyopathy  Ventricular tachycardia-sustained  Atrial fibrillation-persistent  Congestive heart failure-chronic-systolic-class II  Implantable defibrillator-[employer]  Without symptoms of ischemia.  Medications per Dr. 26/11/51  Nonsustained recurrent ventricular tachycardia.  All relatively brief.  No interval atrial fibrillation.  We will continue amiodarone for now but will plan to see him again in 3 months.  If there has been no further atrial fibrillation and infrequent episodes of ventricular tachycardia-nonsustained we will decrease from 1400--1000 mg/week.  Will need amiodarone  labs at that time.  At the 58-month mark we might try and decrease it further to 100 mg a day.  No bleeding with anticoagulation annual daily    Current medicines are reviewed at length with the patient today .  The patient does not  have concerns regarding medicines.

## 2020-12-06 NOTE — Patient Instructions (Signed)
Medication Instructions:  Your physician recommends that you continue on your current medications as directed. Please refer to the Current Medication list given to you today.  *If you need a refill on your cardiac medications before your next appointment, please call your pharmacy*   Lab Work: Liver panel and TSH today  If you have labs (blood work) drawn today and your tests are completely normal, you will receive your results only by: Marland Kitchen MyChart Message (if you have MyChart) OR . A paper copy in the mail If you have any lab test that is abnormal or we need to change your treatment, we will call you to review the results.   Testing/Procedures: None ordered.     Follow-Up: At [facility], you and your health needs are our priority.  As part of our continuing mission to provide you with exceptional heart care, we have created designated Provider Care Teams.  These Care Teams include your primary Cardiologist (physician) and Advanced Practice Providers (APPs -  Physician Assistants and Nurse Practitioners) who all work together to provide you with the care you need, when you need it.  We recommend signing up for the patient portal called "MyChart".  Sign up information is provided on this After Visit Summary.  MyChart is used to connect with patients for Virtual Visits (Telemedicine).  Patients are able to view lab/test results, encounter notes, upcoming appointments, etc.  Non-urgent messages can be sent to your provider as well.   To learn more about what you can do with MyChart, go to [email or web address].    Your next appointment:   3 month(s)  The format for your next appointment:   In Person  Provider:   Sherryl Manges, MD

## 2020-12-07 LAB — TSH: TSH: 4 u[IU]/mL (ref 0.450–4.500)

## 2020-12-07 LAB — HEPATIC FUNCTION PANEL
ALT: 20 IU/L (ref 0–44)
AST: 14 IU/L (ref 0–40)
Albumin: 4.8 g/dL (ref 3.8–4.9)
Alkaline Phosphatase: 42 IU/L — ABNORMAL LOW (ref 44–121)
Bilirubin Total: 0.3 mg/dL (ref 0.0–1.2)
Bilirubin, Direct: <0.1 mg/dL (ref 0.00–0.40)
Total Protein: 7 g/dL (ref 6.0–8.5)

## 2020-12-08 NOTE — Progress Notes (Signed)
Remote ICD transmission.   

## 2020-12-20 ENCOUNTER — Other Ambulatory Visit (HOSPITAL_COMMUNITY): Payer: Self-pay | Admitting: Internal Medicine

## 2021-01-06 ENCOUNTER — Other Ambulatory Visit: Payer: Self-pay | Admitting: Family Medicine

## 2021-01-14 ENCOUNTER — Other Ambulatory Visit: Payer: Self-pay | Admitting: Family Medicine

## 2021-01-18 ENCOUNTER — Other Ambulatory Visit: Payer: Self-pay

## 2021-01-18 ENCOUNTER — Ambulatory Visit: Payer: BC Managed Care – PPO | Admitting: Family Medicine

## 2021-01-18 ENCOUNTER — Encounter: Payer: Self-pay | Admitting: Family Medicine

## 2021-01-18 VITALS — BP 126/80 | HR 72 | Temp 97.4°F | Resp 19 | Ht 74.0 in | Wt 239.2 lb

## 2021-01-18 DIAGNOSIS — Z0000 Encounter for general adult medical examination without abnormal findings: Secondary | ICD-10-CM

## 2021-01-18 DIAGNOSIS — Z125 Encounter for screening for malignant neoplasm of prostate: Secondary | ICD-10-CM

## 2021-01-18 DIAGNOSIS — E118 Type 2 diabetes mellitus with unspecified complications: Secondary | ICD-10-CM | POA: Diagnosis not present

## 2021-01-18 LAB — LIPID PANEL
Cholesterol: 225 mg/dL — ABNORMAL HIGH (ref 0–200)
HDL: 44.3 mg/dL (ref >39.00)
NonHDL: 180.78
Total CHOL/HDL Ratio: 5
Triglycerides: 317 mg/dL — ABNORMAL HIGH (ref 0.0–149.0)
VLDL: 63.4 mg/dL — ABNORMAL HIGH (ref 0.0–40.0)

## 2021-01-18 LAB — CBC WITH DIFFERENTIAL/PLATELET
Basophils Absolute: 0.1 10*3/uL (ref 0.0–0.1)
Basophils Relative: 0.6 % (ref 0.0–3.0)
Eosinophils Absolute: 0.2 10*3/uL (ref 0.0–0.7)
Eosinophils Relative: 2.1 % (ref 0.0–5.0)
HCT: 46.8 % (ref 39.0–52.0)
Hemoglobin: 15.8 g/dL (ref 13.0–17.0)
Lymphocytes Relative: 26.5 % (ref 12.0–46.0)
Lymphs Abs: 3 10*3/uL (ref 0.7–4.0)
MCHC: 33.9 g/dL (ref 30.0–36.0)
MCV: 93.5 fl (ref 78.0–100.0)
Monocytes Absolute: 0.7 10*3/uL (ref 0.1–1.0)
Monocytes Relative: 5.8 % (ref 3.0–12.0)
Neutro Abs: 7.4 10*3/uL (ref 1.4–7.7)
Neutrophils Relative %: 65 % (ref 43.0–77.0)
Platelets: 261 10*3/uL (ref 150.0–400.0)
RBC: 5 Mil/uL (ref 4.22–5.81)
RDW: 14.3 % (ref 11.5–15.5)
WBC: 11.3 10*3/uL — ABNORMAL HIGH (ref 4.0–10.5)

## 2021-01-18 LAB — BASIC METABOLIC PANEL
BUN: 24 mg/dL — ABNORMAL HIGH (ref 6–23)
CO2: 24 mEq/L (ref 19–32)
Calcium: 10.8 mg/dL — ABNORMAL HIGH (ref 8.4–10.5)
Chloride: 100 mEq/L (ref 96–112)
Creatinine, Ser: 1.41 mg/dL (ref 0.40–1.50)
GFR: 55.82 mL/min — ABNORMAL LOW (ref >60.00)
Glucose, Bld: 138 mg/dL — ABNORMAL HIGH (ref 70–99)
Potassium: 4.9 mEq/L (ref 3.5–5.1)
Sodium: 133 mEq/L — ABNORMAL LOW (ref 135–145)

## 2021-01-18 LAB — HEPATIC FUNCTION PANEL
ALT: 25 U/L (ref 0–53)
AST: 25 U/L (ref 0–37)
Albumin: 4.8 g/dL (ref 3.5–5.2)
Alkaline Phosphatase: 38 U/L — ABNORMAL LOW (ref 39–117)
Bilirubin, Direct: 0.1 mg/dL (ref 0.0–0.3)
Total Bilirubin: 0.4 mg/dL (ref 0.2–1.2)
Total Protein: 8.1 g/dL (ref 6.0–8.3)

## 2021-01-18 LAB — PSA: PSA: 0.49 ng/mL (ref 0.10–4.00)

## 2021-01-18 LAB — HEMOGLOBIN A1C: Hgb A1c MFr Bld: 6.5 % (ref 4.6–6.5)

## 2021-01-18 LAB — TSH: TSH: 5.37 u[IU]/mL — ABNORMAL HIGH (ref 0.35–4.50)

## 2021-01-18 LAB — LDL CHOLESTEROL, DIRECT: Direct LDL: 136 mg/dL

## 2021-01-18 NOTE — Assessment & Plan Note (Signed)
Pt's PE WNL w/ exception of obesity.  UTD on colonoscopy, immunizations.  Check labs.  Anticipatory guidance provided.  

## 2021-01-18 NOTE — Patient Instructions (Addendum)
Follow up in 3-4 months to recheck diabetes ?We'll notify you of your lab results and make any changes if needed ?Continue to work on healthy diet and regular exercise- you can do it!! ?Call with any questions or concerns ?Stay Safe!  Stay Healthy! ?Happy Spring!!! ?

## 2021-01-18 NOTE — Progress Notes (Signed)
   Subjective:    Patient ID: Jason Floyd, male    DOB: 1966-11-10, 55 y.o.   MRN: [id number]  HPI CPE- UTD on Tdap, flu, COVID vaccines.  UTD on colonoscopy.  UTD on eye exam, foot exam.  On ARB for renal protection.  Reviewed past medical, surgical, family and social histories.   Patient Care Team    Relationship Specialty Notifications Start End  Sheliah Hatch, MD PCP - General Family Medicine  08/11/12   Bensimhon, Bevelyn Buckles, MD PCP - Advanced Heart Failure Cardiology  06/30/20   Bensimhon, Bevelyn Buckles, MD PCP - Cardiology Cardiology  10/19/20     Health Maintenance  Topic Date Due  . PNEUMOCOCCAL POLYSACCHARIDE VACCINE AGE 47-64 HIGH RISK  02/13/2021 (Originally 10/25/1968)  . HEMOGLOBIN A1C  03/21/2021  . FOOT EXAM  06/17/2021  . OPHTHALMOLOGY EXAM  07/26/2021  . TETANUS/TDAP  09/08/2024  . COLONOSCOPY (Pts 45-68yrs Insurance coverage will need to be confirmed)  11/28/2025  . INFLUENZA VACCINE  Completed  . COVID-19 Vaccine  Completed  . Hepatitis C Screening  Completed  . HIV Screening  Completed  . HPV VACCINES  Aged Out     Review of Systems Patient reports no vision/hearing changes, anorexia, fever ,adenopathy, persistant/recurrent hoarseness, swallowing issues, chest pain, palpitations, edema, persistant/recurrent cough, hemoptysis, dyspnea (rest,exertional, paroxysmal nocturnal), gastrointestinal  bleeding (melena, rectal bleeding), abdominal pain, excessive heart burn, GU symptoms (dysuria, hematuria, voiding/incontinence issues) syncope, focal weakness, memory loss, numbness & tingling, skin/hair/nail changes, depression, anxiety, abnormal bruising/bleeding, musculoskeletal symptoms/signs.   This visit occurred during the SARS-CoV-2 public health emergency.  Safety protocols were in place, including screening questions prior to the visit, additional usage of staff PPE, and extensive cleaning of exam room while observing appropriate contact time as indicated for  disinfecting solutions.       Objective:   Physical Exam General Appearance:    Alert, cooperative, no distress, appears stated age  Head:    Normocephalic, without obvious abnormality, atraumatic  Eyes:    PERRL, conjunctiva/corneas clear, EOM's intact, fundi    benign, both eyes       Ears:    Normal TM's and external ear canals, both ears  Nose:   Deferred due to COVID  Throat:   Neck:   Supple, symmetrical, trachea midline, no adenopathy;       thyroid:  No enlargement/tenderness/nodules  Back:     Symmetric, no curvature, ROM normal, no CVA tenderness  Lungs:     Clear to auscultation bilaterally, respirations unlabored  Chest wall:    No tenderness or deformity  Heart:    Regular rate and rhythm, S1 and S2 normal, no murmur, rub   or gallop  Abdomen:     Soft, non-tender, bowel sounds active all four quadrants,    no masses, no organomegaly  Genitalia:    Deferred  Rectal:    Extremities:   Extremities normal, atraumatic, no cyanosis or edema  Pulses:   2+ and symmetric all extremities  Skin:   Skin color, texture, turgor normal, no rashes or lesions  Lymph nodes:   Cervical, supraclavicular, and axillary nodes normal  Neurologic:   CNII-XII intact. Normal strength, sensation and reflexes      throughout         Assessment & Plan:

## 2021-01-18 NOTE — Assessment & Plan Note (Signed)
Chronic problem.  UTD on foot exam, eye exam, and on ARB for renal protection.  Check labs.  Adjust meds prn

## 2021-01-19 ENCOUNTER — Ambulatory Visit (HOSPITAL_COMMUNITY)
Admission: RE | Admit: 2021-01-19 | Discharge: 2021-01-19 | Disposition: A | Discharge status: Home / Self Care | Payer: BC Managed Care – PPO | Source: Ambulatory Visit | Attending: Internal Medicine | Admitting: Internal Medicine

## 2021-01-19 ENCOUNTER — Other Ambulatory Visit: Payer: Self-pay

## 2021-01-19 ENCOUNTER — Encounter (HOSPITAL_COMMUNITY): Payer: Self-pay | Admitting: Internal Medicine

## 2021-01-19 VITALS — BP 94/58 | HR 63 | Wt 239.2 lb

## 2021-01-19 DIAGNOSIS — Z720 Tobacco use: Secondary | ICD-10-CM | POA: Insufficient documentation

## 2021-01-19 DIAGNOSIS — I5022 Chronic systolic (congestive) heart failure: Secondary | ICD-10-CM | POA: Insufficient documentation

## 2021-01-19 DIAGNOSIS — Z79899 Other long term (current) drug therapy: Secondary | ICD-10-CM | POA: Diagnosis not present

## 2021-01-19 DIAGNOSIS — E119 Type 2 diabetes mellitus without complications: Secondary | ICD-10-CM | POA: Diagnosis not present

## 2021-01-19 DIAGNOSIS — Z95810 Presence of automatic (implantable) cardiac defibrillator: Secondary | ICD-10-CM | POA: Insufficient documentation

## 2021-01-19 DIAGNOSIS — Z86711 Personal history of pulmonary embolism: Secondary | ICD-10-CM | POA: Diagnosis not present

## 2021-01-19 DIAGNOSIS — I472 Ventricular tachycardia, unspecified: Secondary | ICD-10-CM

## 2021-01-19 DIAGNOSIS — Z7982 Long term (current) use of aspirin: Secondary | ICD-10-CM | POA: Insufficient documentation

## 2021-01-19 DIAGNOSIS — F1910 Other psychoactive substance abuse, uncomplicated: Secondary | ICD-10-CM | POA: Diagnosis not present

## 2021-01-19 DIAGNOSIS — Z79890 Hormone replacement therapy (postmenopausal): Secondary | ICD-10-CM | POA: Diagnosis not present

## 2021-01-19 DIAGNOSIS — I4819 Other persistent atrial fibrillation: Secondary | ICD-10-CM | POA: Insufficient documentation

## 2021-01-19 DIAGNOSIS — Z7984 Long term (current) use of oral hypoglycemic drugs: Secondary | ICD-10-CM | POA: Insufficient documentation

## 2021-01-19 DIAGNOSIS — E039 Hypothyroidism, unspecified: Secondary | ICD-10-CM | POA: Insufficient documentation

## 2021-01-19 DIAGNOSIS — Z7901 Long term (current) use of anticoagulants: Secondary | ICD-10-CM | POA: Insufficient documentation

## 2021-01-19 DIAGNOSIS — I2510 Atherosclerotic heart disease of native coronary artery without angina pectoris: Secondary | ICD-10-CM | POA: Diagnosis not present

## 2021-01-19 DIAGNOSIS — E785 Hyperlipidemia, unspecified: Secondary | ICD-10-CM

## 2021-01-19 DIAGNOSIS — E782 Mixed hyperlipidemia: Secondary | ICD-10-CM

## 2021-01-19 DIAGNOSIS — I5042 Chronic combined systolic (congestive) and diastolic (congestive) heart failure: Secondary | ICD-10-CM

## 2021-01-19 MED ORDER — LEVOTHYROXINE SODIUM 50 MCG PO TABS
50.0000 ug | ORAL_TABLET | Freq: Every day | ORAL | 3 refills | Status: DC
Start: 2021-01-19 — End: 2022-01-02

## 2021-01-19 MED ORDER — AMIODARONE HCL 200 MG PO TABS: 100.0000 mg | ORAL_TABLET | Freq: Every day | ORAL | 3 refills | Status: DC

## 2021-01-19 NOTE — Addendum Note (Signed)
Encounter addended by: Chinita Pester, CMA on: 01/19/2021 3:35 PM  Actions taken: Visit diagnoses modified, Order list changed, Diagnosis association updated, Follow-up modified, Clinical Note Signed

## 2021-01-19 NOTE — Progress Notes (Addendum)
ADVANCED HF CLINIC NOTE PCP: Dr. Neena Rhymes HF: Dr. [name] EP: Dr. Graciela Husbands   HPI:  Jason Floyd is 55 year old with a PMH of severe systolic heart failure due to NICM, DM2, former polysubstance and tobacco abuse.    Echo 2008 EF 15% in setting of polysubstance abuse.   ECHO 12/2007 EF 55-60%  Echo 10/20 EF 55-60% no RWMA.   On 06/07/20 had TIA-like symptoms with mild aphasia and weakness (dropped a can with left hand). Went to PCP had carotid u/s and echo. Carotid u/s 06/22/20 1-39% bilaterally. Echo 8/21 EF down to 25-30%. Brain MRI/MRA. Normal vasculature.  Left corona radiata diffusion-weighted/FLAIR hyperintensity is nonspecific. Differential includes active demyelinating lesionversus subacute insult.  Underwent R/L cath 07/01/20 LAD 100% prox LCX 20% RCA 10%  EF 25-30% Ao = 112/72 (90) LV = 111/27 RA = 2 RV = 46/7 PA = 44/12 (27) PCW = 16 Fick cardiac output/index = 6.8/2.9 PVR = 1.5 WU SVR 1047 Ao sat = 96% PA sat = 70%, 78% SVC sat = 74%  After cath underwent cMRI.for viability  - LVEF 26% - LGE suggestive of a very large, wrap-around LAD infarction with extensive no-reflow affecting the subendocardium.   Zio monitored placed for palpitations in 10/21 and found to have VT. Underwent ICD placement by Dr. Graciela Husbands on 08/22/20 Was also found to have volume overload and AF.  Here for f/u. Doing pretty well. Not very active. No CP, edema, orthopnea or PND. Depressed about his heart situation. No ICD shocks  Recent TFTs with hypothyroidism likely due to [facility]   ICD interrogation: HL logic 2. no VT/AF  Activity level 1.6 hours. Personally reviewed   Past Medical History:  Diagnosis Date   Allergy    Arthritis    CHF (congestive heart failure) (HCC)    CHF (congestive heart failure) (HCC)    Diabetes mellitus without complication (HCC)    Drug abuse (HCC)    Eczema    GERD (gastroesophageal reflux disease)    Gout    Heart attack (HCC) 06/10/2020    History of kidney stones    Hyperlipidemia    MI (myocardial infarction) (HCC)    Nephrolithiasis    PE (pulmonary embolism)    Pneumonia     Current Outpatient Medications  Medication Sig Dispense Refill   allopurinol (ZYLOPRIM) 300 MG tablet TAKE 2 TABLETS BY MOUTH DAILY. 60 tablet 6   amiodarone (PACERONE) 200 MG tablet Take 1 tablet (200 mg total) by mouth daily. 90 tablet 2   apixaban (ELIQUIS) 5 MG TABS tablet Take 1 tablet (5 mg total) by mouth 2 (two) times daily. 60 tablet 6   aspirin EC 81 MG tablet Take 81 mg by mouth daily. Swallow whole.     atorvastatin (LIPITOR) 80 MG tablet Take 1 tablet (80 mg total) by mouth daily. 30 tablet 6   calcium carbonate (TUMS - DOSED IN MG ELEMENTAL CALCIUM) 500 MG chewable tablet Chew 2-3 tablets by mouth daily as needed for indigestion or heartburn.     carvedilol (COREG) 12.5 MG tablet Take 1.5 tablets (18.75 mg total) by mouth 2 (two) times daily with a meal. 540 tablet 2   cetirizine (ZYRTEC) 10 MG tablet Take 10 mg by mouth daily.     FARXIGA 5 MG TABS tablet TAKE 1 TABLET BY MOUTH DAILY 30 tablet 6   fenofibrate 160 MG tablet TAKE 1 TABLET BY MOUTH DAILY. 90 tablet 1   HYDROcodone-acetaminophen (NORCO/VICODIN) 5-325 MG tablet  Take 1-2 tablets by mouth every 6 (six) hours as needed for moderate pain or severe pain (post op pain). 20 tablet 0   levothyroxine (SYNTHROID) 50 MCG tablet Take 1 tablet (50 mcg total) by mouth daily. Take 1 tablet 30 minutes before breakfast and all other pills. 90 tablet 3   meloxicam (MOBIC) 15 MG tablet TAKE 1 TABLET BY MOUTH DAILY AS NEEDED FOR PAIN. 15 tablet 0   metFORMIN (GLUCOPHAGE) 1000 MG tablet TAKE 1 TABLET BY MOUTH 2 TIMES DAILY WITH A MEAL. 180 tablet 0   methocarbamol (ROBAXIN) 500 MG tablet TAKE 1 TABLET BY MOUTH AT BEDTIME. 90 tablet 1   Naphazoline HCl (CLEAR EYES OP) Place 1 drop into both eyes daily as needed (sore eyes).     pantoprazole (PROTONIX) 40 MG tablet TAKE 1  TABLET BY MOUTH DAILY. 30 tablet 3   sacubitril-valsartan (ENTRESTO) 49-51 MG Take 1 tablet by mouth 2 (two) times daily. 60 tablet 6   sodium chloride (OCEAN) 0.65 % SOLN nasal spray Place 1 spray into both nostrils as needed (dryness).     spironolactone (ALDACTONE) 25 MG tablet Take 1 tablet (25 mg total) by mouth daily. 30 tablet 3   traZODone (DESYREL) 100 MG tablet TAKE 1 TABLET BY MOUTH AT BEDTIME. 30 tablet 6   triamcinolone (KENALOG) 0.025 % ointment Apply 1 application topically daily as needed.     No current facility-administered medications for this encounter.    No Known Allergies    Social History   Socioeconomic History   Marital status: Married    Spouse name: [name]   Number of children: Not on file   Years of education: Not on file   Highest education level: Bachelor's degree (e.g., BA, AB, BS)  Occupational History   Not on file  Tobacco Use   Smoking status: Former Smoker    Packs/day: 0.25    Years: 31.00    Pack years: 7.75    Types: Cigarettes   Smokeless tobacco: Never Used   Tobacco comment: Stopped a few weeks ago  Vaping Use   Vaping Use: Never used  Substance and Sexual Activity   Alcohol use: Not Currently    Alcohol/week: 0.0 standard drinks   Drug use: Not Currently    Types: Marijuana   Sexual activity: Not on file  Other Topics Concern   Not on file  Social History Narrative   Lives with wife   Caffeine- coffee 4 c daily   Social Determinants of Health   Financial Resource Strain: Not on file  Food Insecurity: Not on file  Transportation Needs: Not on file  Physical Activity: Not on file  Stress: Not on file  Social Connections: Not on file  Intimate Partner Violence: Not on file      Family History  Problem Relation Age of Onset   Dementia Mother        frontal-temporal   Stroke Mother    Breast cancer Maternal Aunt    Other Brother        overdose   Colon cancer Neg Hx     Vitals:   01/19/21  1439  BP: (!) 94/58  Pulse: 63  SpO2: 97%  Weight: 108.5 kg (239 lb 3.2 oz)    PHYSICAL EXAM: General:  Well appearing. No resp difficulty HEENT: normal Neck: supple. no JVD. Carotids 2+ bilat; no bruits. No lymphadenopathy or thryomegaly appreciated. Cor: PMI nondisplaced. Regular rate & rhythm. No rubs, gallops or murmurs. Lungs: clear Abdomen:  soft, nontender, nondistended. No hepatosplenomegaly. No bruits or masses. Good bowel sounds. Extremities: no cyanosis, clubbing, rash, edema Neuro: alert & orientedx3, cranial nerves grossly intact. moves all 4 extremities w/o difficulty. Affect pleasant    ASSESSMENT & PLAN:  1. Recurrent systolic HF due to iCM - Echo 7619 EF 15% in setting of polysubstance abuse.  - ECHO 12/2007 EF 55-60% - Echo 10/20 EF 55-60% no RWMA.  - Echo 8/21 EF 25-30% - Cath 8/21 LAD 100% LCx 20% RCA 10% - cMRI 08/02/20 EF 26% Large anterior infarct - ICD placed 10/21 for VT - Echo 10/17/20 EF 30-35% - NYHA II. Volume status on the low end - Continue carvedilol 18.75 bid - Continue Entresto 49/51 bid (cut back due to low BP) - Continue Farxiga 5 mg daily. - Continue spiro 25 daily - Labs yesterday ok   2. CAD - cath as above with subacute LAD infarct and CTO LAD - no s/s ischemia - continue ASA, statin.  - Last LDL 136 (yesterday) on atorva 80 -> will refer to lipid clinic for PCSK-9  3. VT - s/p ICD 10/21 - quiescent on amio. Drop to 100 daily.  - Keep K. 4.0 Mg > 2.0  4. Persistent AF - In NSR here. No AF on ICD - drop amio to 100 daily  - Continue Eliquis 5 mg bid. No bleeding  5. Tobacco use - working on quitting. Now 0-1 cigs per day.  - discussed cessation   6. DM2 - continue SGLT2i, metformin  - followed by PCP  7. Possible TIA - carotid dopplers ok - Brain MRI/MRA. Vasculature ok. Findings suspicious for cardio-embolism.   8. Hypothyroidism - likely due to amio - PCP started synthroid   Arvilla Meres, MD  3:13  PM

## 2021-01-19 NOTE — Patient Instructions (Signed)
No Labs done today.  DECREASE Amiodarone to 100mg  (1/2 tablet) by mouth daily.  No other medication changes were made. Please continue all current medications as prescribed.  You have been referred to Lipid Clinic. They will contact you to schedule an appointment.   Your physician recommends that you schedule a follow-up appointment in: 3 months with an echo prior to your exam.  Your physician has requested that you have an echocardiogram. Echocardiography is a painless test that uses sound waves to create images of your heart. It provides your doctor with information about the size and shape of your heart and how well your heart's chambers and valves are working. This procedure takes approximately one hour. There are no restrictions for this procedure.   If you have any questions or concerns before your next appointment please send a message through [place] or call our office at [phone].    TO LEAVE A MESSAGE FOR THE NURSE SELECT OPTION 2, PLEASE LEAVE A MESSAGE INCLUDING: . YOUR NAME . DATE OF BIRTH . CALL BACK NUMBER . REASON FOR CALL**this is important as we prioritize the call backs  YOU WILL RECEIVE A CALL BACK THE SAME DAY AS LONG AS YOU CALL BEFORE 4:00 PM   Do the following things EVERYDAY: 1) Weigh yourself in the morning before breakfast. Write it down and keep it in a log. 2) Take your medicines as prescribed 3) Eat low salt foods--Limit salt (sodium) to 2000 mg per day.  4) Stay as active as you can everyday 5) Limit all fluids for the day to less than 2 liters   At the [facility], you and your health needs are our priority. As part of our continuing mission to provide you with exceptional heart care, we have created designated Provider Care Teams. These Care Teams include your primary Cardiologist (physician) and Advanced Practice Providers (APPs- Physician Assistants and Nurse Practitioners) who all work together to provide you with the care  you need, when you need it.   You may see any of the following providers on your designated Care Team at your next follow up: [phone] Dr Marland Kitchen . Dr Arvilla Meres . Marca Ancona, NP . Tonye Becket, PA . Robbie Lis, PharmD   Please be sure to bring in all your medications bottles to every appointment.

## 2021-01-30 NOTE — Progress Notes (Signed)
Patient ID: Jason Floyd                 DOB: 03/08/1966                    MRN: [id number]     HPI: Jason Floyd is a 55 y.o. male patient referred to lipid clinic by Dr. Haroldine Laws. PMH is significant for severe systolic heart failure due to NICM, persistent AF, VT s/p ICD 10/21, DM2, gout, former polysubstance and tobacco abuse, CAD, TIA (05/2020). ECHO 2008 EF 15% in setting of polysubstance abuse, EF recovered to 55-60% 08/2019. ECHO 06/2020 EF down to 25-30%. Underwent R/L cath 06/2020, prox LAD 100% stenosed, LCX 20%, RCA 10%, EF 25-30%. Then underwent cardiac MRI for viability, LVEF 26%, LGE suggestive of very large, wrap-around LAD infarction with extensive no-reflow affecting subendocardium, unlikely to improve with resvascularization. Zio monitored placed for palpitations in 08/2020 and found to have VT. Underwent ICD placement by Dr. Caryl Comes on 08/22/20 Was also found to have volume overload and AF.  Last visit with Dr. Haroldine Laws on 3/10, noted LDL of 136, referred to lipid clinic for PCSK9 initiation.   Today, patient arrives in good spirits. Reports tolerating atorvastatin 80 mg daily and fenofibrate 160 mg daily. Per chart review, has been on atorvastatin 20 mg and fenofibrate since 2013, and atorvastatin was increased to 80 mg 07/2020 by Dr. Haroldine Laws. Reports that his diet has been better but he and his wife are working on portion control and working toward their plates having 1/2 vegetables, 1/4 starch, and 1/4 protein. His exercise is limited s/p torn meniscus repair, causes his knee to swell up.   Current Medications: atorvastatin 80 mg daily, fenofibrate 160 mg daily Intolerances: none Risk Factors: premature ASCVD (TIA, CAD), afib, T2DM, HLD, former polysubstance abuse LDL goal: <55 mg/dL  Diet: He does the cooking for family; goal is 1/2 plate of veggies, 1/4 starch, 1/4 protein Breakfast: may have a bagel, honey bunches of oats, cheese toast, or sometimes skips Lunch/dinner  (eats 1 larger meal later in the afternoon): meal kit service Later snack/small meal: leftovers, cookies/milk, pudding, hummus/carrots, charcuterie Drinks: 16 oz of coffee in the morning with sugar and half and half, drinks mostly water, may have 6oz coke/dr pepper once per week, drinks alcohol once per month  Exercise: Torn meniscus repair limits movement; competes in dog sports that requires some sprinting  Family History: Stroke in mother  Social History: Former smoker, no current alcohol use  Labs: 01/18/21: TC 225, HDL 44, LDL 136, TG 317 (on atorvastatin 80 mg, fenofibrate 160 mg) 06/17/20: TC 161, HDL 44, LDL 89, TG 190 (on atorvastatin 20 mg, fenofibrate 160 mg) 04/30/19: TC 186, HDL 32, LDL 79, TG 646   Past Medical History:  Diagnosis Date  . Allergy   . Arthritis   . CHF (congestive heart failure) ([place])   . CHF (congestive heart failure) ([place])   . Diabetes mellitus without complication ([place])   . Drug abuse ([place])   . Eczema   . GERD (gastroesophageal reflux disease)   . Gout   . Heart attack ([place]) 06/10/2020  . History of kidney stones   . Hyperlipidemia   . MI (myocardial infarction) ([place])   . Nephrolithiasis   . PE (pulmonary embolism)   . Pneumonia     Current Outpatient Medications on File Prior to Visit  Medication Sig Dispense Refill  . allopurinol (ZYLOPRIM) 300 MG tablet TAKE 2 TABLETS BY MOUTH DAILY. [place]  tablet 6  . amiodarone (PACERONE) 200 MG tablet Take 0.5 tablets (100 mg total) by mouth daily. 45 tablet 3  . apixaban (ELIQUIS) 5 MG TABS tablet Take 1 tablet (5 mg total) by mouth 2 (two) times daily. 60 tablet 6  . aspirin EC 81 MG tablet Take 81 mg by mouth daily. Swallow whole.    Marland Kitchen atorvastatin (LIPITOR) 80 MG tablet Take 1 tablet (80 mg total) by mouth daily. 30 tablet 6  . calcium carbonate (TUMS - DOSED IN MG ELEMENTAL CALCIUM) 500 MG chewable tablet Chew 2-3 tablets by mouth daily as needed for indigestion or heartburn.    . carvedilol (COREG) 12.5  MG tablet Take 1.5 tablets (18.75 mg total) by mouth 2 (two) times daily with a meal. 540 tablet 2  . cetirizine (ZYRTEC) 10 MG tablet Take 10 mg by mouth daily.    Marland Kitchen FARXIGA 5 MG TABS tablet TAKE 1 TABLET BY MOUTH DAILY 30 tablet 6  . fenofibrate 160 MG tablet TAKE 1 TABLET BY MOUTH DAILY. 90 tablet 1  . HYDROcodone-acetaminophen (NORCO/VICODIN) 5-325 MG tablet Take 1-2 tablets by mouth every 6 (six) hours as needed for moderate pain or severe pain (post op pain). 20 tablet 0  . levothyroxine (SYNTHROID) 50 MCG tablet Take 1 tablet (50 mcg total) by mouth daily. Take 1 tablet 30 minutes before breakfast and all other pills. 90 tablet 3  . meloxicam (MOBIC) 15 MG tablet TAKE 1 TABLET BY MOUTH DAILY AS NEEDED FOR PAIN. 15 tablet 0  . metFORMIN (GLUCOPHAGE) 1000 MG tablet TAKE 1 TABLET BY MOUTH 2 TIMES DAILY WITH A MEAL. 180 tablet 0  . methocarbamol (ROBAXIN) 500 MG tablet TAKE 1 TABLET BY MOUTH AT BEDTIME. 90 tablet 1  . Naphazoline HCl (CLEAR EYES OP) Place 1 drop into both eyes daily as needed (sore eyes).    . pantoprazole (PROTONIX) 40 MG tablet TAKE 1 TABLET BY MOUTH DAILY. 30 tablet 3  . sacubitril-valsartan (ENTRESTO) 49-51 MG Take 1 tablet by mouth 2 (two) times daily. 60 tablet 6  . sodium chloride (OCEAN) 0.65 % SOLN nasal spray Place 1 spray into both nostrils as needed (dryness).    Marland Kitchen spironolactone (ALDACTONE) 25 MG tablet Take 1 tablet (25 mg total) by mouth daily. 30 tablet 3  . traZODone (DESYREL) 100 MG tablet TAKE 1 TABLET BY MOUTH AT BEDTIME. 30 tablet 6  . triamcinolone (KENALOG) 0.025 % ointment Apply 1 application topically daily as needed.     No current facility-administered medications on file prior to visit.    No Known Allergies  Assessment/Plan:  1. Hyperlipidemia - Last LDL of 136 mg/dL is above goal <55 mg/dL given premature and extensive ASCVD, and last triglyceride of 317 mg/dL is above goal <150 mg/dL. Currently tolerating atorvastatin 80 mg daily and  fenofibrate 160 mg daily with no reported missed doses. Given history of multiple premature ASCVD events and T2DM, requires additional lipid lowering medications to bring LDL and triglycerides to goals as above and reduce risk of future ASCVD events. Will start Praluent 150 mg SQ every 14 days and Vascepa 2 g BID for LDL/TG lowering and CV benefit. Prior authorizations submitted for both of these medications. Educated patient on proper administration and storage of Praluent. Will continue atorvastatin and fenofibrate. Discussed lifestyle modifications for diet and exercise. Will update patient on prior authorization approval and provide pharmacy with co-pay card information to bring cost of Praluent to $25 per 30 day supply and Vascepa to $9  per 90 day supply. Follow up fasting lipid panel in 3 months.   Rebbeca Paul, PharmD PGY1 Pharmacy Resident 01/31/2021 3:32 PM

## 2021-01-31 ENCOUNTER — Ambulatory Visit (INDEPENDENT_AMBULATORY_CARE_PROVIDER_SITE_OTHER): Payer: BC Managed Care – PPO | Admitting: Student-PharmD

## 2021-01-31 ENCOUNTER — Other Ambulatory Visit: Payer: Self-pay

## 2021-01-31 DIAGNOSIS — E782 Mixed hyperlipidemia: Secondary | ICD-10-CM

## 2021-01-31 NOTE — Patient Instructions (Signed)
Nice to see you today!  Keep up the good work with diet and exercise. Aim for a diet full of vegetables, fruit and lean meats (chicken, [place], fish). Try to limit carbs (bread, pasta, sugar, rice) and red meat consumption.  Your goal LDL is less than 55 mg/dL, you're currently at 149 mg/dL. Your goal triglycerides is less than 150 mg/dL, you're currently at 702 mg/dL.   Medication Changes: I will submit prior authorizations to your insurance for Praluent (lowers LDL) and Vascepa (lowers triglycerides). I will also send co-pay card information to your pharmacy which will lower the cost of Praluent to $25 for a 30 day supply and Vascepa to $9 for a 90 day supply.   LDL:  -Continue atorvastatin 80 mg daily -Start Praluent 150 mg (1 injection) every 14 days  Triglycerides:  -Continue fenofibrate 160 mg daily -Start Vascepa 2 capsules twice daily  Follow up fasting lipid panel in 3 months (June 22)  Please give [place] a call at [phone] with any questions or concerns.   For Praluent, inject once every other week (any day of the week that works for you) into the fatty skin of stomach, upper outer thigh or back of the arm. Clean the site with soap and warm water or an alcohol pad. Keep the medication in the fridge until you are ready to give your dose, then take it out and let warm up to room temperature for 30-60 mins.

## 2021-02-01 ENCOUNTER — Telehealth: Payer: Self-pay | Admitting: Student-PharmD

## 2021-02-01 ENCOUNTER — Encounter (HOSPITAL_COMMUNITY): Payer: Self-pay

## 2021-02-01 MED ORDER — VASCEPA 1 G PO CAPS: 2.0000 g | ORAL_CAPSULE | Freq: Two times a day (BID) | ORAL | 3 refills | Status: DC

## 2021-02-01 MED ORDER — PRALUENT 150 MG/ML ~~LOC~~ SOAJ: 150.0000 mg | SUBCUTANEOUS | 11 refills | Status: DC

## 2021-02-01 MED ORDER — PRALUENT 150 MG/ML ~~LOC~~ SOAJ: 150.0000 mg | SUBCUTANEOUS | 3 refills | Status: DC

## 2021-02-01 NOTE — Telephone Encounter (Signed)
Prior authorizations approved for Praluent (through 01/31/22) and Vascepa (through 02/01/24). Prescriptions sent in to patient's pharmacy. [facility] is not able to order Praluent, but provided copay card information for Vascepa to bring cost down to $9 for 90 day supply. Prescription for Praluent sent in to [facility] on [address] per patient's request. Provided insurance information and confirmed they were able to run it through his insurance. Also provided copay card information to bring cost down to $25 for a 30 day supply. Called patient to update him on this.

## 2021-02-02 ENCOUNTER — Other Ambulatory Visit (HOSPITAL_COMMUNITY): Payer: Self-pay | Admitting: Internal Medicine

## 2021-02-02 ENCOUNTER — Other Ambulatory Visit (HOSPITAL_COMMUNITY): Payer: Self-pay | Admitting: *Deleted

## 2021-02-02 MED ORDER — ICOSAPENT ETHYL 1 G PO CAPS: 2.0000 g | ORAL_CAPSULE | Freq: Two times a day (BID) | ORAL | 3 refills | Status: DC

## 2021-02-13 ENCOUNTER — Other Ambulatory Visit: Payer: Self-pay | Admitting: Internal Medicine

## 2021-02-13 ENCOUNTER — Other Ambulatory Visit: Payer: Self-pay | Admitting: Family Medicine

## 2021-02-13 ENCOUNTER — Other Ambulatory Visit (HOSPITAL_COMMUNITY): Payer: Self-pay | Admitting: Internal Medicine

## 2021-02-13 NOTE — Telephone Encounter (Signed)
Eliquis 5mg  refill request received. Patient is 55 years old, weight-108.5kg, Crea-1.41 on 01/18/2021, Diagnosis-Afib, and last seen by Dr. 03/20/2021 on 01/19/2021. Dose is appropriate based on dosing criteria. Will send in refill to requested pharmacy.

## 2021-02-21 ENCOUNTER — Encounter (HOSPITAL_COMMUNITY): Payer: Self-pay

## 2021-02-21 DIAGNOSIS — U071 COVID-19: Secondary | ICD-10-CM

## 2021-02-22 ENCOUNTER — Other Ambulatory Visit: Payer: BC Managed Care – PPO

## 2021-02-23 ENCOUNTER — Ambulatory Visit (INDEPENDENT_AMBULATORY_CARE_PROVIDER_SITE_OTHER): Payer: BC Managed Care – PPO

## 2021-02-23 DIAGNOSIS — I255 Ischemic cardiomyopathy: Secondary | ICD-10-CM | POA: Diagnosis not present

## 2021-02-25 ENCOUNTER — Telehealth: Payer: Self-pay | Admitting: Unknown Physician Specialty

## 2021-02-25 NOTE — Telephone Encounter (Signed)
Called to discuss with patient about COVID-19 symptoms and the use of one of the available treatments for those with mild to moderate Covid symptoms and at a high risk of hospitalization.  Pt appears to qualify for outpatient treatment due to co-morbid conditions and/or a member of an at-risk group in accordance with the FDA Emergency Use Authorization.    Symptom onset: 4/11 Vaccinated: yes Booster? yes Qualifiers: CHF  Pt is outside the window for oral (5 day) and mab (7 day)    Jason Floyd

## 2021-02-27 LAB — CUP PACEART REMOTE DEVICE CHECK
Battery Remaining Longevity: 156 mo
Battery Remaining Percentage: 100 %
Brady Statistic RA Percent Paced: 0 %
Brady Statistic RV Percent Paced: 0 %
Date Time Interrogation Session: 20220414004200
HighPow Impedance: 81 Ohm
Implantable Lead Implant Date: 20211011
Implantable Lead Implant Date: 20211011
Implantable Lead Location: 753859
Implantable Lead Location: 753860
Implantable Lead Model: 138
Implantable Lead Model: 7841
Implantable Lead Serial Number: 1097338
Implantable Lead Serial Number: 303261
Implantable Pulse Generator Implant Date: 20211011
Lead Channel Impedance Value: 598 Ohm
Lead Channel Impedance Value: 635 Ohm
Lead Channel Setting Pacing Amplitude: 2.5 V
Lead Channel Setting Pacing Pulse Width: 0.4 ms
Lead Channel Setting Sensing Sensitivity: 0.5 mV
Pulse Gen Serial Number: 228262

## 2021-03-02 ENCOUNTER — Other Ambulatory Visit: Payer: BC Managed Care – PPO

## 2021-03-07 ENCOUNTER — Other Ambulatory Visit (INDEPENDENT_AMBULATORY_CARE_PROVIDER_SITE_OTHER): Payer: BC Managed Care – PPO

## 2021-03-07 ENCOUNTER — Other Ambulatory Visit: Payer: Self-pay

## 2021-03-07 DIAGNOSIS — E039 Hypothyroidism, unspecified: Secondary | ICD-10-CM | POA: Diagnosis not present

## 2021-03-07 LAB — TSH: TSH: 1.86 u[IU]/mL (ref 0.35–4.50)

## 2021-03-09 DIAGNOSIS — I255 Ischemic cardiomyopathy: Secondary | ICD-10-CM | POA: Insufficient documentation

## 2021-03-09 DIAGNOSIS — I4819 Other persistent atrial fibrillation: Secondary | ICD-10-CM | POA: Insufficient documentation

## 2021-03-13 NOTE — Progress Notes (Signed)
Remote ICD transmission.   

## 2021-03-16 ENCOUNTER — Other Ambulatory Visit (HOSPITAL_COMMUNITY): Payer: Self-pay | Admitting: Internal Medicine

## 2021-03-22 ENCOUNTER — Other Ambulatory Visit: Payer: Self-pay

## 2021-03-22 ENCOUNTER — Encounter: Payer: BC Managed Care – PPO | Admitting: Internal Medicine

## 2021-03-22 DIAGNOSIS — I4819 Other persistent atrial fibrillation: Secondary | ICD-10-CM

## 2021-03-22 DIAGNOSIS — Z95810 Presence of automatic (implantable) cardiac defibrillator: Secondary | ICD-10-CM

## 2021-03-22 DIAGNOSIS — I255 Ischemic cardiomyopathy: Secondary | ICD-10-CM

## 2021-03-22 DIAGNOSIS — Z8679 Personal history of other diseases of the circulatory system: Secondary | ICD-10-CM

## 2021-03-22 DIAGNOSIS — I472 Ventricular tachycardia: Secondary | ICD-10-CM

## 2021-03-22 NOTE — Addendum Note (Signed)
Encounter addended by: Crissie Figures, RN on: 03/22/2021 4:31 PM  Actions taken: Imaging Exam ended

## 2021-03-28 ENCOUNTER — Encounter: Payer: Self-pay | Admitting: Internal Medicine

## 2021-03-28 ENCOUNTER — Ambulatory Visit: Payer: BC Managed Care – PPO | Admitting: Internal Medicine

## 2021-03-28 ENCOUNTER — Other Ambulatory Visit: Payer: Self-pay

## 2021-03-28 VITALS — BP 118/80 | HR 63 | Ht 74.0 in | Wt 240.4 lb

## 2021-03-28 DIAGNOSIS — I472 Ventricular tachycardia, unspecified: Secondary | ICD-10-CM

## 2021-03-28 DIAGNOSIS — Z95810 Presence of automatic (implantable) cardiac defibrillator: Secondary | ICD-10-CM

## 2021-03-28 DIAGNOSIS — I255 Ischemic cardiomyopathy: Secondary | ICD-10-CM | POA: Diagnosis not present

## 2021-03-28 NOTE — Progress Notes (Signed)
Patient Care Team: Sheliah Hatch, MD as PCP - General (Family Medicine) Bensimhon, Bevelyn Buckles, MD as PCP - Advanced Heart Failure (Cardiology)   HPI  Jason Floyd is a 55 y.o. male seen in follow-up for an ICD implanted for secondary prevention 10/21 for ventricular tachycardia in the setting of ischemic heart disease, chronic congestive heart failure persistent atrial fibrillation and a prior TIA.  On apixaban without bleeding  Because of recurrent ventricular tachycardia he was started on amiodarone with hopes thereafter of cardioversion for atrial fibrillation--spontaneously converted.  No interval ventricular tachycardia in the heart failure clinic decreased amiodarone to 100 mg daily  3/22  Patient denies symptoms of GI intolerance, sun sensitivity, neurological symptoms attributable to amiodarone.    Mild dyspnea on exertion.  Today he is feeling alright overall. He is excited that his shortness of breath and fatigue continues to improve. He notes having some light sensitivity, particularly in bright sunlight outdoors. When driving at night, he is also bothered by glare.   Occasionally he has noticed a small amount of blood on the toilet paper and in the toilet bowl. He notes this symptom also appeared prior to beginning Eliquis. His last colonoscopy was performed 11/29/2015 by Rob Bunting, MD.    The patient denies orthopnea or peripheral edema.  There have been no palpitations, lightheadedness or syncope. Also denies any chest pain or nocturnal dyspnea.   DATE TEST EF   8/21 LHC 25-30  % LADp-T; LCx-20;RCA-30  8/21 cMRI 26% LGE>> transmural MI  12/21 Echo  30-35%         Date Cr K Hgb TSH LFTs  8/21    2.83 21/57  12/21 1.33 4.5 15.9     3/22  1.41 4.9 15.8 5.37 25        Records and Results Reviewed   Past Medical History:  Diagnosis Date  . Allergy   . Arthritis   . CHF (congestive heart failure) ([facility])   . CHF (congestive heart failure) ([facility])   .  Diabetes mellitus without complication ([facility])   . Drug abuse ([facility])   . Eczema   . GERD (gastroesophageal reflux disease)   . Gout   . Heart attack ([facility]) 06/10/2020  . History of kidney stones   . Hyperlipidemia   . MI (myocardial infarction) ([facility])   . Nephrolithiasis   . PE (pulmonary embolism)   . Pneumonia     Past Surgical History:  Procedure Laterality Date  . CERVICAL FUSION    . ICD IMPLANT N/A 08/22/2020   Procedure: ICD IMPLANT;  Surgeon: Duke Salvia, MD;  Location: [facility] INVASIVE CV LAB;  Service: Cardiovascular;  Laterality: N/A;  . KNEE ARTHROSCOPY Right 10/28/2020   Procedure: RIGHT KNEE ARTHROSCOPY, PARTIAL MEDIAL MENISCECTOMY, CHONDROPLASTY;  Surgeon: Marcene Corning, MD;  Location: [facility] ORS;  Service: Orthopedics;  Laterality: Right;  . RIGHT/LEFT HEART CATH AND CORONARY ANGIOGRAPHY N/A 07/01/2020   Procedure: RIGHT/LEFT HEART CATH AND CORONARY ANGIOGRAPHY;  Surgeon: Dolores Patty, MD;  Location: [facility] INVASIVE CV LAB;  Service: Cardiovascular;  Laterality: N/A;  . SHOULDER ARTHROSCOPY Left   . WISDOM TOOTH EXTRACTION      Current Meds  Medication Sig  . Alirocumab (PRALUENT) 150 MG/ML SOAJ Inject 150 mg into the skin every 14 (fourteen) days.  Marland Kitchen allopurinol (ZYLOPRIM) 300 MG tablet TAKE 2 TABLETS BY MOUTH DAILY.  Marland Kitchen amiodarone (PACERONE) 200 MG tablet Take 0.5 tablets (100 mg total) by mouth daily.  Marland Kitchen aspirin  EC 81 MG tablet Take 81 mg by mouth daily. Swallow whole.  Marland Kitchen atorvastatin (LIPITOR) 80 MG tablet TAKE 1 TABLET BY MOUTH DAILY.  . calcium carbonate (TUMS - DOSED IN MG ELEMENTAL CALCIUM) 500 MG chewable tablet Chew 2-3 tablets by mouth daily as needed for indigestion or heartburn.  . carvedilol (COREG) 12.5 MG tablet Take 1.5 tablets (18.75 mg total) by mouth 2 (two) times daily with a meal.  . cetirizine (ZYRTEC) 10 MG tablet Take 10 mg by mouth daily.  Marland Kitchen ELIQUIS 5 MG TABS tablet TAKE 1 TABLET BY MOUTH 2 TIMES DAILY.  Marland Kitchen ENTRESTO 49-51 MG TAKE 1 TABLET BY  MOUTH 2 TIMES DAILY.  Marland Kitchen FARXIGA 5 MG TABS tablet TAKE 1 TABLET BY MOUTH DAILY  . fenofibrate 160 MG tablet TAKE 1 TABLET BY MOUTH DAILY.  Marland Kitchen icosapent Ethyl (VASCEPA) 1 g capsule Take 2 capsules (2 g total) by mouth 2 (two) times daily.  Marland Kitchen levothyroxine (SYNTHROID) 50 MCG tablet Take 1 tablet (50 mcg total) by mouth daily. Take 1 tablet 30 minutes before breakfast and all other pills.  . meloxicam (MOBIC) 15 MG tablet Take 1 tablet (15 mg total) by mouth daily as needed for pain.  . metFORMIN (GLUCOPHAGE) 1000 MG tablet TAKE 1 TABLET BY MOUTH 2 TIMES DAILY WITH A MEAL.  . methocarbamol (ROBAXIN) 500 MG tablet TAKE 1 TABLET BY MOUTH AT BEDTIME.  . Naphazoline HCl (CLEAR EYES OP) Place 1 drop into both eyes daily as needed (sore eyes).  . pantoprazole (PROTONIX) 40 MG tablet TAKE 1 TABLET BY MOUTH DAILY.  . sodium chloride (OCEAN) 0.65 % SOLN nasal spray Place 1 spray into both nostrils as needed (dryness).  Marland Kitchen spironolactone (ALDACTONE) 25 MG tablet Take 1 tablet (25 mg total) by mouth daily.  . traZODone (DESYREL) 100 MG tablet TAKE 1 TABLET BY MOUTH AT BEDTIME.  Marland Kitchen triamcinolone (KENALOG) 0.025 % ointment Apply 1 application topically daily as needed.    No Known Allergies    Review of Systems negative except from HPI and PMH  Physical Exam BP 118/80   Pulse 63   Ht 6\' 2"  (1.88 m)   Wt 240 lb 6.4 oz (109 kg)   SpO2 96%   BMI 30.87 kg/m  Well developed and well nourished in no acute distress HENT normal Neck supple with JVP-flat Clear Device pocket well healed; without hematoma or erythema.  There is no tethering  Regular rate and rhythm, no murmur Abd-soft with active BS No Clubbing cyanosis edema Skin-warm and dry A & Oriented  Grossly normal sensory and motor function  ECG sinus at 63 Interval 23/11/44 Left axis deviation Poor R wave progression Low voltage limb leads   CrCl cannot be calculated (Patient's most recent lab result is older than the maximum 21 days  allowed.).   Assessment and  Plan  Ischemic cardiomyopathy  Low voltage  Ventricular tachycardia-nonsustained  Atrial fibrillation-persistent  Congestive heart failure-chronic-systolic-class II  Implantable defibrillator-[employer]  Blood per rectum  Without symptoms of ischemia.  Medications per Dr. 25/11/44  No intercurrent Ventricular tachycardia.  Continue amiodarone at 100 mg daily for nonsustained ventricular tachycardia .  Amiodarone surveillance laboratories are within normal range;  we will plan in 9 months, if there is no interval VT-sustained or  atrial fibrillation to discontinue his amiodarone  On Anticoagulant for thromboembolic risk reduction.  No bleeding issues.  Continue Eliquis at 5 twice daily  No interval atrial fibrillation detected on his device.   Blood per  rectum.  Antedates his anticoagulant.  It sounds superficial.  I have reached out to Dr. Christella Hartigan, colonoscopy 2017, to see what needs to be done next.      I,Alexis Bryant,acting as a scribe for Sherryl Manges, MD.,have documented all relevant documentation on the behalf of Sherryl Manges, MD,as directed by  Sherryl Manges, MD while in the presence of Sherryl Manges, MD.  I, Sherryl Manges, MD, have reviewed all documentation for this visit. The documentation on 03/28/21 for the exam, diagnosis, procedures, and orders are all accurate and complete.

## 2021-03-28 NOTE — Patient Instructions (Signed)
Medication Instructions:  Your physician recommends that you continue on your current medications as directed. Please refer to the Current Medication list given to you today.  *If you need a refill on your cardiac medications before your next appointment, please call your pharmacy*   Lab Work: None ordered.  If you have labs (blood work) drawn today and your tests are completely normal, you will receive your results only by: . MyChart Message (if you have MyChart) OR . A paper copy in the mail If you have any lab test that is abnormal or we need to change your treatment, we will call you to review the results.   Testing/Procedures: None ordered.    Follow-Up: At [facility], you and your health needs are our priority.  As part of our continuing mission to provide you with exceptional heart care, we have created designated Provider Care Teams.  These Care Teams include your primary Cardiologist (physician) and Advanced Practice Providers (APPs -  Physician Assistants and Nurse Practitioners) who all work together to provide you with the care you need, when you need it.  We recommend signing up for the patient portal called "MyChart".  Sign up information is provided on this After Visit Summary.  MyChart is used to connect with patients for Virtual Visits (Telemedicine).  Patients are able to view lab/test results, encounter notes, upcoming appointments, etc.  Non-urgent messages can be sent to your provider as well.   To learn more about what you can do with MyChart, go to [email or web address].    Your next appointment:   9 month(s)  The format for your next appointment:   In Person  Provider:   You will see one of the following Advanced Practice Providers on your designated Care Team:    Amber Seiler, NP  Renee Ursuy, PA-C  Michael "Andy" Tillery, PA-C      

## 2021-04-04 ENCOUNTER — Other Ambulatory Visit (HOSPITAL_COMMUNITY): Payer: Self-pay | Admitting: Internal Medicine

## 2021-04-14 ENCOUNTER — Other Ambulatory Visit: Payer: Self-pay | Admitting: Family Medicine

## 2021-04-26 ENCOUNTER — Other Ambulatory Visit: Payer: Self-pay | Admitting: Family Medicine

## 2021-04-26 ENCOUNTER — Ambulatory Visit: Payer: BC Managed Care – PPO | Admitting: Family Medicine

## 2021-04-26 ENCOUNTER — Encounter: Payer: Self-pay | Admitting: Family Medicine

## 2021-04-26 ENCOUNTER — Other Ambulatory Visit: Payer: Self-pay

## 2021-04-26 VITALS — BP 100/80 | HR 73 | Temp 97.8°F | Resp 20 | Ht 74.0 in | Wt 243.8 lb

## 2021-04-26 DIAGNOSIS — M25561 Pain in right knee: Secondary | ICD-10-CM

## 2021-04-26 DIAGNOSIS — M79645 Pain in left finger(s): Secondary | ICD-10-CM

## 2021-04-26 DIAGNOSIS — M5030 Other cervical disc degeneration, unspecified cervical region: Secondary | ICD-10-CM

## 2021-04-26 DIAGNOSIS — E118 Type 2 diabetes mellitus with unspecified complications: Secondary | ICD-10-CM

## 2021-04-26 DIAGNOSIS — Z23 Encounter for immunization: Secondary | ICD-10-CM

## 2021-04-26 DIAGNOSIS — Z3009 Encounter for other general counseling and advice on contraception: Secondary | ICD-10-CM

## 2021-04-26 DIAGNOSIS — G8929 Other chronic pain: Secondary | ICD-10-CM

## 2021-04-26 MED ORDER — MELOXICAM 15 MG PO TABS: 15.0000 mg | ORAL_TABLET | Freq: Every day | ORAL | 3 refills | Status: DC | PRN

## 2021-04-26 MED ORDER — METHOCARBAMOL 500 MG PO TABS: 1.0000 | ORAL_TABLET | Freq: Four times a day (QID) | ORAL | 1 refills | Status: DC | PRN

## 2021-04-26 NOTE — Progress Notes (Signed)
Subjective:    Patient ID: Jason Floyd, male    DOB: 05-20-1966, 55 y.o.   MRN: [id number]  HPI DM- chronic problem, on Farxiga 5mg  daily, Metformin 1000mg  BID.  Last A1C 6.5%  UTD on eye exam, foot exam.  On Entresto for renal protection.  Not checking sugars.  Denies symptomatic lows.  No CP, SOB, HAs, visual changes, abd pain, N/V.  Pt is interested in a vasectomy.  Vaccine counseling- pt is asking about Pneumonia and shingles vaccines.  Wonders when he is able to get COVID booster b/c he had COVID in April.  DDD- pt reports since he has stopped the Meloxicam (due to blood thinner) he needs more methocarbamol than just QHS.  Asking if we can change the prescription back to Q6 or Q8.    R knee pain- has hx of meniscal tear and surgery.  Now having chronic pain, starts to swell w/ increased activity.  No relief w/ tylenol.  Has seen [facility] recently.  L thumb pain- has seen Dr in the past.  Had steroid injxns but over time those became ineffective.  Was told that surgery may be necessary   Review of Systems For ROS see HPI   This visit occurred during the SARS-CoV-2 public health emergency.  Safety protocols were in place, including screening questions prior to the visit, additional usage of staff PPE, and extensive cleaning of exam room while observing appropriate contact time as indicated for disinfecting solutions.      Objective:   Physical Exam Vitals reviewed.  Constitutional:      General: He is not in acute distress.    Appearance: Normal appearance. He is well-developed. He is not ill-appearing.  HENT:     Head: Normocephalic and atraumatic.  Eyes:     Extraocular Movements: Extraocular movements intact.     Conjunctiva/sclera: Conjunctivae normal.     Pupils: Pupils are equal, round, and reactive to light.  Neck:     Thyroid: No thyromegaly.  Cardiovascular:     Rate and Rhythm: Normal rate and regular rhythm.     Pulses: Normal pulses.     Heart  sounds: Normal heart sounds. No murmur heard. Pulmonary:     Effort: Pulmonary effort is normal. No respiratory distress.     Breath sounds: Normal breath sounds.  Abdominal:     General: Bowel sounds are normal. There is no distension.     Palpations: Abdomen is soft.  Musculoskeletal:     Cervical back: Normal range of motion and neck supple.     Right lower leg: No edema.     Left lower leg: No edema.  Lymphadenopathy:     Cervical: No cervical adenopathy.  Skin:    General: Skin is warm and dry.  Neurological:     General: No focal deficit present.     Mental Status: He is alert and oriented to person, place, and time.     Cranial Nerves: No cranial nerve deficit.  Psychiatric:        Mood and Affect: Mood normal.        Behavior: Behavior normal.          Assessment & Plan:  Knee pain- new to provider, but ongoing for pt.  Had surgery w/ Dr May in 2021.  Will refer back for evaluation and tx.  Thumb pain- pt has seen Dr Jerl Santos and had injections in the past.  Was told that surgery would likely be necessary.  Will re-refer.  Vasectomy- referral to urology placed.

## 2021-04-26 NOTE — Assessment & Plan Note (Signed)
Chronic problem.  Due to his limited use of NSAIDs I will increase the frequency of his Methocarbamol to use as needed.  I will also provide a small quantity of Meloxicam to use as needed since this was approved by Cardiology.

## 2021-04-26 NOTE — Patient Instructions (Signed)
Follow up in 3-4 months to recheck sugar We'll notify you of your lab results and make any changes if needed Ortho, Hand, and Urology should all call you to schedule We'll do your Shingles shot next visit You can get your COVID booster at any time Call with any questions or concerns Stay Safe!  Stay Healthy! Have a great summer!!!

## 2021-04-26 NOTE — Assessment & Plan Note (Signed)
Chronic problem.  Has been doing well on Farxiga and Metformin.  UTD on eye exam, foot exam, and on Entresto for renal protection.  Check labs.  Adjust meds prn

## 2021-04-27 ENCOUNTER — Ambulatory Visit (HOSPITAL_BASED_OUTPATIENT_CLINIC_OR_DEPARTMENT_OTHER)
Admission: RE | Admit: 2021-04-27 | Discharge: 2021-04-27 | Disposition: A | Discharge status: Home / Self Care | Payer: BC Managed Care – PPO | Source: Ambulatory Visit | Attending: Internal Medicine | Admitting: Internal Medicine

## 2021-04-27 ENCOUNTER — Ambulatory Visit (HOSPITAL_COMMUNITY)
Admission: RE | Admit: 2021-04-27 | Discharge: 2021-04-27 | Disposition: A | Discharge status: Home / Self Care | Payer: BC Managed Care – PPO | Source: Ambulatory Visit | Attending: Internal Medicine | Admitting: Internal Medicine

## 2021-04-27 ENCOUNTER — Encounter (HOSPITAL_COMMUNITY): Payer: Self-pay | Admitting: Internal Medicine

## 2021-04-27 VITALS — BP 96/60 | HR 61

## 2021-04-27 DIAGNOSIS — E119 Type 2 diabetes mellitus without complications: Secondary | ICD-10-CM | POA: Diagnosis not present

## 2021-04-27 DIAGNOSIS — Z791 Long term (current) use of non-steroidal anti-inflammatories (NSAID): Secondary | ICD-10-CM | POA: Insufficient documentation

## 2021-04-27 DIAGNOSIS — Z95810 Presence of automatic (implantable) cardiac defibrillator: Secondary | ICD-10-CM | POA: Insufficient documentation

## 2021-04-27 DIAGNOSIS — I2510 Atherosclerotic heart disease of native coronary artery without angina pectoris: Secondary | ICD-10-CM

## 2021-04-27 DIAGNOSIS — I252 Old myocardial infarction: Secondary | ICD-10-CM | POA: Diagnosis not present

## 2021-04-27 DIAGNOSIS — I4819 Other persistent atrial fibrillation: Secondary | ICD-10-CM | POA: Diagnosis not present

## 2021-04-27 DIAGNOSIS — F17210 Nicotine dependence, cigarettes, uncomplicated: Secondary | ICD-10-CM | POA: Insufficient documentation

## 2021-04-27 DIAGNOSIS — I5042 Chronic combined systolic (congestive) and diastolic (congestive) heart failure: Secondary | ICD-10-CM

## 2021-04-27 DIAGNOSIS — Z7984 Long term (current) use of oral hypoglycemic drugs: Secondary | ICD-10-CM | POA: Insufficient documentation

## 2021-04-27 DIAGNOSIS — Z7901 Long term (current) use of anticoagulants: Secondary | ICD-10-CM | POA: Diagnosis not present

## 2021-04-27 DIAGNOSIS — Z7982 Long term (current) use of aspirin: Secondary | ICD-10-CM | POA: Insufficient documentation

## 2021-04-27 DIAGNOSIS — I472 Ventricular tachycardia, unspecified: Secondary | ICD-10-CM

## 2021-04-27 DIAGNOSIS — I5022 Chronic systolic (congestive) heart failure: Secondary | ICD-10-CM | POA: Diagnosis not present

## 2021-04-27 DIAGNOSIS — E039 Hypothyroidism, unspecified: Secondary | ICD-10-CM | POA: Diagnosis not present

## 2021-04-27 DIAGNOSIS — E785 Hyperlipidemia, unspecified: Secondary | ICD-10-CM | POA: Insufficient documentation

## 2021-04-27 DIAGNOSIS — Z79899 Other long term (current) drug therapy: Secondary | ICD-10-CM | POA: Insufficient documentation

## 2021-04-27 LAB — BASIC METABOLIC PANEL
BUN: 17 mg/dL (ref 6–23)
CO2: 24 mEq/L (ref 19–32)
Calcium: 10 mg/dL (ref 8.4–10.5)
Chloride: 102 mEq/L (ref 96–112)
Creatinine, Ser: 1.12 mg/dL (ref 0.40–1.50)
GFR: 73.44 mL/min (ref >60.00)
Glucose, Bld: 159 mg/dL — ABNORMAL HIGH (ref 70–99)
Potassium: 4.6 mEq/L (ref 3.5–5.1)
Sodium: 136 mEq/L (ref 135–145)

## 2021-04-27 LAB — CBC WITH DIFFERENTIAL/PLATELET
Basophils Absolute: 0.1 10*3/uL (ref 0.0–0.1)
Basophils Relative: 0.8 % (ref 0.0–3.0)
Eosinophils Absolute: 0.2 10*3/uL (ref 0.0–0.7)
Eosinophils Relative: 2.2 % (ref 0.0–5.0)
HCT: 43.6 % (ref 39.0–52.0)
Hemoglobin: 15.1 g/dL (ref 13.0–17.0)
Lymphocytes Relative: 31.2 % (ref 12.0–46.0)
Lymphs Abs: 2.9 10*3/uL (ref 0.7–4.0)
MCHC: 34.6 g/dL (ref 30.0–36.0)
MCV: 92.2 fl (ref 78.0–100.0)
Monocytes Absolute: 0.7 10*3/uL (ref 0.1–1.0)
Monocytes Relative: 7.1 % (ref 3.0–12.0)
Neutro Abs: 5.5 10*3/uL (ref 1.4–7.7)
Neutrophils Relative %: 58.7 % (ref 43.0–77.0)
Platelets: 261 10*3/uL (ref 150.0–400.0)
RBC: 4.73 Mil/uL (ref 4.22–5.81)
RDW: 13.1 % (ref 11.5–15.5)
WBC: 9.4 10*3/uL (ref 4.0–10.5)

## 2021-04-27 LAB — ECHOCARDIOGRAM COMPLETE
Area-P 1/2: 1.57 cm2
Calc EF: 47.2 %
S' Lateral: 3.9 cm
Single Plane A2C EF: 49.9 %
Single Plane A4C EF: 49.3 %

## 2021-04-27 LAB — HEMOGLOBIN A1C: Hgb A1c MFr Bld: 7.4 % — ABNORMAL HIGH (ref 4.6–6.5)

## 2021-04-27 LAB — HEPATIC FUNCTION PANEL
ALT: 29 U/L (ref 0–53)
AST: 18 U/L (ref 0–37)
Albumin: 4.7 g/dL (ref 3.5–5.2)
Alkaline Phosphatase: 35 U/L — ABNORMAL LOW (ref 39–117)
Bilirubin, Direct: 0.1 mg/dL (ref 0.0–0.3)
Total Bilirubin: 0.5 mg/dL (ref 0.2–1.2)
Total Protein: 7.2 g/dL (ref 6.0–8.3)

## 2021-04-27 LAB — LIPID PANEL
Cholesterol: 103 mg/dL (ref 0–200)
HDL: 42.7 mg/dL (ref >39.00)
LDL Cholesterol: 24 mg/dL (ref 0–99)
NonHDL: 60.48
Total CHOL/HDL Ratio: 2
Triglycerides: 182 mg/dL — ABNORMAL HIGH (ref 0.0–149.0)
VLDL: 36.4 mg/dL (ref 0.0–40.0)

## 2021-04-27 LAB — TSH: TSH: 1.93 u[IU]/mL (ref 0.35–4.50)

## 2021-04-27 NOTE — Progress Notes (Signed)
  Echocardiogram 2D Echocardiogram has been performed.  Augustine Radar 04/27/2021, 2:49 PM

## 2021-04-27 NOTE — Patient Instructions (Signed)
No Labs done today.   No medication changes were made. Please continue all current medications as prescribed.  Your physician recommends that you schedule a follow-up appointment in: 6 months. Please contact our office in November to schedule December.    If you have any questions or concerns before your next appointment please send [place] a message through [place] or call our office at [phone].    TO LEAVE A MESSAGE FOR THE NURSE SELECT OPTION 2, PLEASE LEAVE A MESSAGE INCLUDING: YOUR NAME DATE OF BIRTH CALL BACK NUMBER REASON FOR CALL**this is important as we prioritize the call backs  YOU WILL RECEIVE A CALL BACK THE SAME DAY AS LONG AS YOU CALL BEFORE 4:00 PM   Do the following things EVERYDAY: Weigh yourself in the morning before breakfast. Write it down and keep it in a log. Take your medicines as prescribed Eat low salt foods--Limit salt (sodium) to 2000 mg per day.  Stay as active as you can everyday Limit all fluids for the day to less than 2 liters   At the [facility], you and your health needs are our priority. As part of our continuing mission to provide you with exceptional heart care, we have created designated Provider Care Teams. These Care Teams include your primary Cardiologist (physician) and Advanced Practice Providers (APPs- Physician Assistants and Nurse Practitioners) who all work together to provide you with the care you need, when you need it.   You may see any of the following providers on your designated Care Team at your next follow up: Dr Arvilla Meres Dr Carron Curie, NP Robbie Lis, [place] Karle Plumber, PharmD   Please be sure to bring in all your medications bottles to every appointment.

## 2021-04-27 NOTE — Addendum Note (Signed)
Encounter addended by: Chinita Pester, CMA on: 04/27/2021 4:18 PM  Actions taken: Clinical Note Signed

## 2021-04-27 NOTE — Progress Notes (Signed)
AD[facility]ANCED [facility] CLINIC NOTE PCP: Dr. Neena Rhymes HF: Dr. [name] EP: Dr. Graciela Husbands   HPI:  Jason Floyd is 55 year old with a PMH of severe systolic heart failure due to NICM, DM2, former polysubstance and tobacco abuse.    Echo 2008 EF 15% in setting of polysubstance abuse.   ECHO 12/2007 EF 55-60%  Echo 10/20 EF 55-60% no RWMA.   On 06/07/20 had TIA-like symptoms with mild aphasia and weakness (dropped a can with left hand). Went to PCP had carotid u/s and echo. Carotid u/s 06/22/20 1-39% bilaterally. Echo 8/21 EF down to 25-30%. Brain MRI/MRA. Normal vasculature.  Left corona radiata diffusion-weighted/FLAIR hyperintensity is nonspecific. Differential includes active demyelinating lesionversus subacute insult.  Underwent R/L cath 07/01/20 LAD 100% prox LCX 20% RCA 10%  EF 25-30% Ao = 112/72 (90) LV = 111/27 RA = 2 RV = 46/7 PA = 44/12 (27) PCW = 16 Fick cardiac output/index = 6.8/2.9 PVR = 1.5 WU SVR 1047 Ao sat = 96% PA sat = 70%, 78% SVC sat = 74%   After cath underwent cMRI.for viability  - LVEF 26% - LGE suggestive of a very large, wrap-around LAD infarction with extensive no-reflow affecting the subendocardium.   Zio monitored placed for palpitations in 10/21 and found to have VT. Underwent ICD placement by Dr. Graciela Husbands on 08/22/20 Was also found to have volume overload and AF.  Last visit doing pretty well. Not very active. No CP, edema, orthopnea or PND. Depressed about his heart situation. No ICD shocks.  Last TFTs with hypothyroidism likely due to amio pcp started on synthroid.  Decreased amio to 100mg  daily.    Here for follow up.  Feeling well, breathing well, had meniscus repaired in December.  Is doing dog training, competing in dog sporting events. Goes up and down stairs in his house, has 13 dogs in his house doesn't get short of breath chasing them around.  No chest pain, orthopnea or PND.    ICD interrogated, no VT/VF, afib/atach last 3 months.  1.8 hours  activity daily.     Past Medical History:  Diagnosis Date   Allergy    Arthritis    CHF (congestive heart failure) (HCC)    CHF (congestive heart failure) (HCC)    Diabetes mellitus without complication (HCC)    Drug abuse (HCC)    Eczema    GERD (gastroesophageal reflux disease)    Gout    Heart attack (HCC) 06/10/2020   History of kidney stones    Hyperlipidemia    MI (myocardial infarction) (HCC)    Nephrolithiasis    PE (pulmonary embolism)    Pneumonia     Current Outpatient Medications  Medication Sig Dispense Refill   Alirocumab (PRALUENT) 150 MG/ML SOAJ Inject 150 mg into the skin every 14 (fourteen) days. 6 mL 3   allopurinol (ZYLOPRIM) 300 MG tablet TAKE 2 TABLETS BY MOUTH DAILY. 60 tablet 6   amiodarone (PACERONE) 200 MG tablet Take 0.5 tablets (100 mg total) by mouth daily. 45 tablet 3   aspirin EC 81 MG tablet Take 81 mg by mouth daily. Swallow whole.     atorvastatin (LIPITOR) 80 MG tablet TAKE 1 TABLET BY MOUTH DAILY. 30 tablet 6   calcium carbonate (TUMS - DOSED IN MG ELEMENTAL CALCIUM) 500 MG chewable tablet Chew 2-3 tablets by mouth daily as needed for indigestion or heartburn.     carvedilol (COREG) 12.5 MG tablet Take 1.5 tablets (18.75 mg total) by mouth 2 (  two) times daily with a meal. 540 tablet 2   cetirizine (ZYRTEC) 10 MG tablet Take 10 mg by mouth daily.     ELIQUIS 5 MG TABS tablet TAKE 1 TABLET BY MOUTH 2 TIMES DAILY. 60 tablet 6   ENTRESTO 49-51 MG TAKE 1 TABLET BY MOUTH 2 TIMES DAILY. 60 tablet 11   FARXIGA 5 MG TABS tablet TAKE 1 TABLET BY MOUTH DAILY 30 tablet 6   fenofibrate 160 MG tablet TAKE 1 TABLET BY MOUTH DAILY. 90 tablet 1   icosapent Ethyl (VASCEPA) 1 g capsule Take 2 capsules (2 g total) by mouth 2 (two) times daily. 360 capsule 3   levothyroxine (SYNTHROID) 50 MCG tablet Take 1 tablet (50 mcg total) by mouth daily. Take 1 tablet 30 minutes before breakfast and all other pills. 90 tablet 3   meloxicam (MOBIC) 15 MG tablet Take 1 tablet  (15 mg total) by mouth daily as needed for pain. 15 tablet 3   metFORMIN (GLUCOPHAGE) 1000 MG tablet TAKE 1 TABLET BY MOUTH 2 TIMES DAILY WITH A MEAL. 180 tablet 0   methocarbamol (ROBAXIN) 500 MG tablet Take 1 tablet (500 mg total) by mouth every 6 (six) hours as needed for muscle spasms. 90 tablet 1   Naphazoline HCl (CLEAR EYES OP) Place 1 drop into both eyes daily as needed (sore eyes).     pantoprazole (PROTONIX) 40 MG tablet TAKE 1 TABLET BY MOUTH DAILY. 30 tablet 2   sodium chloride (OCEAN) 0.65 % SOLN nasal spray Place 1 spray into both nostrils as needed (dryness).     spironolactone (ALDACTONE) 25 MG tablet Take 1 tablet (25 mg total) by mouth daily. 30 tablet 11   traZODone (DESYREL) 100 MG tablet TAKE 1 TABLET BY MOUTH AT BEDTIME. 30 tablet 0   triamcinolone (KENALOG) 0.025 % ointment Apply 1 application topically daily as needed.     No current facility-administered medications for this encounter.    No Known Allergies    Social History   Socioeconomic History   Marital status: Married    Spouse name: Jason Floyd   Number of children: Not on file   Years of education: Not on file   Highest education level: Bachelor's degree (e.g., BA, AB, BS)  Occupational History   Not on file  Tobacco Use   Smoking status: Former    Packs/day: 0.25    Years: 31.00    Pack years: 7.75    Types: Cigarettes   Smokeless tobacco: Never   Tobacco comments:    Stopped a few weeks ago  Vaping Use   Vaping Use: Never used  Substance and Sexual Activity   Alcohol use: Not Currently    Alcohol/week: 0.0 standard drinks   Drug use: Not Currently    Types: Marijuana   Sexual activity: Not on file  Other Topics Concern   Not on file  Social History Narrative   Lives with wife   Caffeine- coffee 4 c daily   Social Determinants of Health   Financial Resource Strain: Not on file  Food Insecurity: Not on file  Transportation Needs: Not on file  Physical Activity: Not on file  Stress: Not  on file  Social Connections: Not on file  Intimate Partner Violence: Not on file      Family History  Problem Relation Age of Onset   Dementia Mother        frontal-temporal   Stroke Mother    Breast cancer Maternal Aunt    Other  Brother        overdose   Colon cancer Neg Hx     Vitals:   04/27/21 1511  BP: 96/60  Pulse: 61  SpO2: 98%    PHYSICAL EXAM: General:  Well appearing. No resp difficulty HEENT: normal Neck: supple. no JVD. Carotids 2+ bilat; no bruits. No lymphadenopathy or thryomegaly appreciated. Cor: PMI nondisplaced. Regular rate & rhythm. No rubs, gallops or murmurs. Lungs: clear Abdomen: soft, nontender, nondistended. No hepatosplenomegaly. No bruits or masses. Good bowel sounds. Extremities: no cyanosis, clubbing, rash, edema Neuro: alert & orientedx3, cranial nerves grossly intact. moves all 4 extremities w/o difficulty. Affect pleasant    ASSESSMENT & PLAN:  1. Recurrent systolic HF due to iCM - Echo 8299 EF 15% in setting of polysubstance abuse.  - ECHO 12/2007 EF 55-60% - Echo 10/20 EF 55-60% no RWMA.  - Echo 8/21 EF 25-30% - Cath 8/21 LAD 100% LCx 20% RCA 10% - cMRI 08/02/20 EF 26% Large anterior infarct - ICD placed 10/21 for VT - Echo 10/17/20 EF 30-35% - NYHA II. Volume status on the low end - Continue carvedilol 18.75 bid - Continue Entresto 49/51 bid (cut back due to low BP) - Continue Farxiga 5 mg daily. - Continue spiro 25 daily - Labs yesterday ok   2. CAD - cath as above with subacute LAD infarct and CTO LAD - no s/s ischemia - continue ASA, statin.  - Last LDL 24 (yesterday) last visit referred to lipid clinic and started on praluent and icosapent.  Also on fenofibrate, lipitor 80mg .  TG's better at 182  3. VT - s/p ICD 10/21 - quiescent on amio Last visit decreased to 100mg  daily.  He is followed by Dr. 11/21 last seen 03/2021 plan is to continue amio for 9 months and if no VT or afib he will discontinue. - Keep K. 4.0 Mg >  2.0  4. Persistent AF - In NSR here. No AF on ICD - last visit decreased amio to 100 daily.  He is followed by Dr. Graciela Husbands last seen 03/2021 plan is to continue amio for 9 months and if no VT or afib he will discontinue. - Continue Eliquis 5 mg bid. No bleeding  5. Tobacco use - working on quitting. Now 0-1 cigs per day.  - discussed cessation   6. DM2 - continue SGLT2i, metformin  - followed by PCP  7. Hx Possible TIA - carotid dopplers ok - Brain MRI/MRA. Vasculature ok. Findings suspicious for cardio-embolism.   8. Hypothyroidism - likely due to amio - PCP started synthroid - TSH yesterday wnl    Graciela Husbands, MD  3:17 PM  Patient seen and examined with the above-signed Advanced Practice Provider and/or Housestaff. I personally reviewed laboratory data, imaging studies and relevant notes. I independently examined the patient and formulated the important aspects of the plan. I have edited the note to reflect any of my changes or salient points. I have personally discussed the plan with the patient and/or family.  Doing very well. No CP or SOB. BP on the low side but asymptomatic.Volume status looks good off lasix  EF 40-45% on echo today. Personally reviewed  General:  Well appearing. No resp difficulty HEENT: normal Neck: supple. no JVD. Carotids 2+ bilat; no bruits. No lymphadenopathy or thryomegaly appreciated. Cor: PMI nondisplaced. Regular rate & rhythm. No rubs, gallops or murmurs. Lungs: clear Abdomen: soft, nontender, nondistended. No hepatosplenomegaly. No bruits or masses. Good bowel sounds. Extremities: no cyanosis, clubbing, rash,  edema Neuro: alert & orientedx3, cranial nerves grossly intact. moves all 4 extremities w/o difficulty. Affect pleasant  Doing well NYHA II. EF 40-45% on echo today. ICD interrogated in clinic today HL Score = 0. No further AF/VT On good GDMT. BP too low to titrate. Dr Graciela Husbands managing amio.   Arvilla Meres, MD  4:10  PM

## 2021-04-28 ENCOUNTER — Other Ambulatory Visit: Payer: Self-pay

## 2021-04-28 MED ORDER — DAPAGLIFLOZIN PROPANEDIOL 10 MG PO TABS: 10.0000 mg | ORAL_TABLET | Freq: Every day | ORAL | 0 refills | Status: DC

## 2021-04-29 ENCOUNTER — Other Ambulatory Visit: Payer: Self-pay | Admitting: Family Medicine

## 2021-05-03 ENCOUNTER — Other Ambulatory Visit: Payer: BC Managed Care – PPO

## 2021-05-09 ENCOUNTER — Other Ambulatory Visit: Payer: Self-pay | Admitting: Family Medicine

## 2021-05-09 NOTE — Telephone Encounter (Signed)
LFD 04/14/21 #30 with no refills LOV 04/26/21 NOV none

## 2021-05-10 ENCOUNTER — Encounter: Payer: Self-pay | Admitting: *Deleted

## 2021-05-25 ENCOUNTER — Ambulatory Visit (INDEPENDENT_AMBULATORY_CARE_PROVIDER_SITE_OTHER): Payer: BC Managed Care – PPO

## 2021-05-25 DIAGNOSIS — I5022 Chronic systolic (congestive) heart failure: Secondary | ICD-10-CM | POA: Diagnosis not present

## 2021-05-25 LAB — CUP PACEART REMOTE DEVICE CHECK
Battery Remaining Longevity: 156 mo
Battery Remaining Percentage: 100 %
Brady Statistic RA Percent Paced: 0 %
Brady Statistic RV Percent Paced: 0 %
Date Time Interrogation Session: 20220714004200
HighPow Impedance: 80 Ohm
Implantable Lead Implant Date: 20211011
Implantable Lead Implant Date: 20211011
Implantable Lead Location: 753859
Implantable Lead Location: 753860
Implantable Lead Model: 138
Implantable Lead Model: 7841
Implantable Lead Serial Number: 1097338
Implantable Lead Serial Number: 303261
Implantable Pulse Generator Implant Date: 20211011
Lead Channel Impedance Value: 581 Ohm
Lead Channel Impedance Value: 662 Ohm
Lead Channel Setting Pacing Amplitude: 2.5 V
Lead Channel Setting Pacing Pulse Width: 0.4 ms
Lead Channel Setting Sensing Sensitivity: 0.5 mV
Pulse Gen Serial Number: 228262

## 2021-05-29 ENCOUNTER — Telehealth: Payer: Self-pay | Admitting: *Deleted

## 2021-05-29 NOTE — Telephone Encounter (Signed)
   [facility] Pre-operative Risk Assessment    Patient Name: Jason Floyd  DOB: July 27, 1966 MRN: [id number]  HEARTCARE STAFF:  - IMPORTANT!!!!!! Under Visit Info/Reason for Call, type in Other and utilize the format Clearance MM/DD/YY or Clearance TBD. Do not use dashes or single digits. - Please review there is not already an duplicate clearance open for this procedure. - If request is for dental extraction, please clarify the # of teeth to be extracted. - If the patient is currently at the dentist's office, call Pre-Op Callback Staff (MA/nurse) to input urgent request.  - If the patient is not currently in the dentist office, please route to the Pre-Op pool.  Request for surgical clearance:  What type of surgery is being performed? LEFT THUMB CARPOMETACARPAL ARTHROPLASTY  When is this surgery scheduled? 06/28/21  What type of clearance is required (medical clearance vs. Pharmacy clearance to hold med vs. Both)? BOTH  Are there any medications that need to be held prior to surgery and how long?  ASA AND ELIQUIS  Practice name and name of physician performing surgery? [facility]; DR. FRED ORTMANN  What is the office phone number? [phone]   7.   What is the office fax number? [phone]: ATTN: ASHLEY HILTON  8.   Anesthesia type (None, local, MAC, general) ? REGIONAL BLOCK WITH IV SEDATION   Julaine Hua 05/29/2021, 5:02 PM  _________________________________________________________________   (provider comments below)

## 2021-05-30 NOTE — Telephone Encounter (Signed)
Patient with diagnosis of afib on Eliquis for anticoagulation.    Procedure: LEFT THUMB CARPOMETACARPAL ARTHROPLASTY Date of procedure: 06/28/21  CHA2DS2-VASc Score = 6  This indicates a 9.7% annual risk of stroke. The patient's score is based upon: CHF History: Yes HTN History: Yes Diabetes History: Yes Stroke History: Yes Vascular Disease History: Yes Age Score: 0 Gender Score: 0  Also has hx of PE.  CrCl 82mL/min using adjusted body weight Platelet count 261K  Per office protocol, patient can hold Eliquis for 1 day prior to procedure. Resume as soon as safely possible due to elevated CV risk off of anticoag. Preop team to address ASA hold.

## 2021-05-30 NOTE — Telephone Encounter (Signed)
Jason Floyd 55 year old male is requesting preoperative cardiac evaluation for left thumb carpometacarpal arthroplasty.  He was last seen in the clinic on 04/27/2021.  He was doing well at that time.  Echocardiogram 10/17/2020 showed EF 30-35%.  On GDMT.  Underwent L/R cath 8/21 which showed 100% LAD, LCx 20%, RCA 10%.  Cardiac MRI 08/02/2020 showed EF 26% large anterior infarct.  ICD placed 10/21 for VT.  His PMH includes recurrent systolic heart failure due to ICM, CAD, VT, persistent AF, tobacco use, drug abuse, HLD, gout, PE, possible TIA, hypothyroidism pneumonia, and type 2 diabetes.  May his aspirin be held prior to his surgery?  Thank you for your help.  Please direct response to CV DIV pool.  Thomasene Ripple. Barbera Perritt NP-C    05/30/2021, 8:21 AM [facility] [address] Office [phone] Fax [phone]

## 2021-05-31 NOTE — Telephone Encounter (Signed)
   Name: Jason Floyd  DOB: [birth year]  MRN: [id number]   Primary Cardiologist: None  Chart reviewed as part of pre-operative protocol coverage.   [name] 55 year old male is requesting preoperative cardiac evaluation for left thumb carpometacarpal arthroplasty.    His PMH includes recurrent systolic heart failure due to ICM, CAD, VT, persistent AF, tobacco use, drug abuse, HLD, gout, PE, possible TIA, hypothyroidism pneumonia, and type 2 diabetes.  He was last seen in the clinic on 04/27/2021.  He was doing well at that time.  Echocardiogram 10/17/2020 showed EF 30-35%.  On GDMT.  Underwent L/R cath 8/21 which showed 100% LAD, LCx 20%, RCA 10%.  Cardiac MRI 08/02/2020 showed EF 26% large anterior infarct.  ICD placed 10/21 for VT.  Per pharmacy, he can hold Eliquis for 1 day prior to procedure and resume as soon as safely possible due to elevated CV risk off of anticoagulation. CHA2DS2VASc score of at least  6 (CHF, HTN, DM2, CVA, vascular).    Per his primary cardiologist, acceptable to proceed to surgery at this time.   Regarding ASA therapy, we recommend continuation of ASA throughout the perioperative period.  However, if the surgeon feels that cessation of ASA is required in the perioperative period, it may be stopped 5-7 days prior to surgery with a plan to resume it as soon as felt to be feasible from a surgical standpoint in the post-operative period.   Based on ACC/AHA guidelines, the patient would be at acceptable risk for the planned procedure without further cardiovascular testing.   I will route this recommendation to the requesting party via Epic fax function and remove from pre-op pool. Please call with questions.  Lennon Alstrom, PA-C 05/31/2021, 10:49 AM

## 2021-06-05 ENCOUNTER — Other Ambulatory Visit: Payer: Self-pay | Admitting: Family Medicine

## 2021-06-05 NOTE — Telephone Encounter (Signed)
LFD 04/26/21 #90 with 1 refill LOV 04/26/21 NOV none

## 2021-06-17 NOTE — Progress Notes (Signed)
Remote ICD transmission.   

## 2021-07-05 ENCOUNTER — Other Ambulatory Visit: Payer: Self-pay | Admitting: Family Medicine

## 2021-07-06 ENCOUNTER — Telehealth (HOSPITAL_COMMUNITY): Payer: Self-pay | Admitting: Pharmacy Technician

## 2021-07-06 NOTE — Telephone Encounter (Signed)
Advanced [facility] Failure Patient Advocate Encounter  Prior Authorization for Jason Floyd has been approved.    PA# [id number] Effective dates: 07/06/21 through 07/06/22  Archer Asa, CPhT

## 2021-07-06 NOTE — Telephone Encounter (Signed)
Patient Advocate Encounter   Received notification from [employer] that prior authorization for Jason Floyd is required.   PA submitted on CoverMyMeds Key [id number] Status is pending   Will continue to follow.

## 2021-07-27 ENCOUNTER — Other Ambulatory Visit: Payer: Self-pay | Admitting: Family

## 2021-08-01 ENCOUNTER — Other Ambulatory Visit: Payer: Self-pay | Admitting: Family Medicine

## 2021-08-24 ENCOUNTER — Ambulatory Visit (INDEPENDENT_AMBULATORY_CARE_PROVIDER_SITE_OTHER): Payer: BC Managed Care – PPO

## 2021-08-24 DIAGNOSIS — I255 Ischemic cardiomyopathy: Secondary | ICD-10-CM | POA: Diagnosis not present

## 2021-08-24 LAB — CUP PACEART REMOTE DEVICE CHECK
Battery Remaining Longevity: 156 mo
Battery Remaining Percentage: 100 %
Brady Statistic RA Percent Paced: 0 %
Brady Statistic RV Percent Paced: 0 %
Date Time Interrogation Session: 20221013010600
HighPow Impedance: 72 Ohm
Implantable Lead Implant Date: 20211011
Implantable Lead Implant Date: 20211011
Implantable Lead Location: 753859
Implantable Lead Location: 753860
Implantable Lead Model: 138
Implantable Lead Model: 7841
Implantable Lead Serial Number: 1097338
Implantable Lead Serial Number: 303261
Implantable Pulse Generator Implant Date: 20211011
Lead Channel Impedance Value: 594 Ohm
Lead Channel Impedance Value: 627 Ohm
Lead Channel Setting Pacing Amplitude: 2.5 V
Lead Channel Setting Pacing Pulse Width: 0.4 ms
Lead Channel Setting Sensing Sensitivity: 0.5 mV
Pulse Gen Serial Number: 228262

## 2021-08-25 ENCOUNTER — Other Ambulatory Visit: Payer: Self-pay | Admitting: Orthopaedic Surgery

## 2021-08-25 DIAGNOSIS — Z01818 Encounter for other preprocedural examination: Secondary | ICD-10-CM

## 2021-08-26 ENCOUNTER — Other Ambulatory Visit: Payer: Self-pay | Admitting: Family Medicine

## 2021-09-01 NOTE — Progress Notes (Signed)
Remote ICD transmission.   

## 2021-09-15 ENCOUNTER — Other Ambulatory Visit: Payer: Self-pay

## 2021-09-15 ENCOUNTER — Encounter: Payer: Self-pay | Admitting: Family Medicine

## 2021-09-15 ENCOUNTER — Ambulatory Visit (INDEPENDENT_AMBULATORY_CARE_PROVIDER_SITE_OTHER): Payer: BC Managed Care – PPO | Admitting: Family Medicine

## 2021-09-15 VITALS — BP 122/78 | HR 66 | Temp 98.0°F | Resp 18 | Ht 74.0 in | Wt 240.6 lb

## 2021-09-15 DIAGNOSIS — E118 Type 2 diabetes mellitus with unspecified complications: Secondary | ICD-10-CM | POA: Diagnosis not present

## 2021-09-15 DIAGNOSIS — E785 Hyperlipidemia, unspecified: Secondary | ICD-10-CM

## 2021-09-15 DIAGNOSIS — I10 Essential (primary) hypertension: Secondary | ICD-10-CM | POA: Diagnosis not present

## 2021-09-15 NOTE — Patient Instructions (Addendum)
Schedule your complete physical for after 01/18/22 We'll notify you of your lab results and make any changes if needed Continue to work on healthy diet and regular exercise- you can do it! Have your eye doctor send me a copy of their report We will check w/ Dr [name] about surgical clearance Call with any questions or concerns Stay Safe!  Stay Healthy! GOOD LUCK!!!!

## 2021-09-15 NOTE — Assessment & Plan Note (Signed)
Chronic problem.  Hx of good control w/ last A1C 7.4%.  On Farxiga and Metformin.  Eye exam is scheduled.  On ARB for renal protection.  Foot exam done.  Check labs.  Adjust meds prn

## 2021-09-15 NOTE — Assessment & Plan Note (Signed)
Chronic problem.  On Praluent, Lipitor, Fenofibrate, and Vascepa.  Check labs.  Adjust meds prn

## 2021-09-15 NOTE — Assessment & Plan Note (Signed)
Chronic problem.  Currently well controlled.  Asymptomatic. Check labs due to ARB and diuretics.  No anticipated med changes.

## 2021-09-15 NOTE — Progress Notes (Signed)
   Subjective:    Patient ID: Jason Floyd, male    DOB: 07-Jul-1966, 55 y.o.   MRN: [id number]  HPI DM- chronic problem, on Farxiga 10mg  daily, Metformin 1000mg  BID.  Last A1C 7.4%.  Due for foot exam, eye exam (scheduled).  On ARB for renal protection.  HTN- chronic problem, on Coreg 18.75mg  BID, Entresto 49/51mg  daily, Aldactone 25mg  daily w/ good control.  Denies CP, SOB, HAs, visual changes, edema.  Hyperlipidemia- chronic problem, on Praluent 150mg  Q14 days, Lipitor 80mg  daily, Fenofibrate 160mg  daily, Vascepa 2 g BID.  Denies abd pain, N/V  R knee replacement- pt reports it is bone on bone.  Dr is the surgeon.  Is on Eliquis BID, ASA 81mg , Vascepa 2 g BID.   Review of Systems For ROS see HPI   This visit occurred during the SARS-CoV-2 public health emergency.  Safety protocols were in place, including screening questions prior to the visit, additional usage of staff PPE, and extensive cleaning of exam room while observing appropriate contact time as indicated for disinfecting solutions.      Objective:   Physical Exam Vitals reviewed.  Constitutional:      General: He is not in acute distress.    Appearance: Normal appearance. He is well-developed. He is not ill-appearing.  HENT:     Head: Normocephalic and atraumatic.  Eyes:     Extraocular Movements: Extraocular movements intact.     Conjunctiva/sclera: Conjunctivae normal.     Pupils: Pupils are equal, round, and reactive to light.  Neck:     Thyroid: No thyromegaly.  Cardiovascular:     Rate and Rhythm: Normal rate and regular rhythm.     Pulses: Normal pulses.     Heart sounds: Normal heart sounds. No murmur heard. Pulmonary:     Effort: Pulmonary effort is normal. No respiratory distress.     Breath sounds: Normal breath sounds.  Abdominal:     General: Bowel sounds are normal. There is no distension.     Palpations: Abdomen is soft.  Musculoskeletal:     Cervical back: Normal range of motion and neck  supple.     Right lower leg: No edema.     Left lower leg: No edema.  Lymphadenopathy:     Cervical: No cervical adenopathy.  Skin:    General: Skin is warm and dry.  Neurological:     General: No focal deficit present.     Mental Status: He is alert and oriented to person, place, and time.     Cranial Nerves: No cranial nerve deficit.  Psychiatric:        Mood and Affect: Mood normal.        Behavior: Behavior normal.          Assessment & Plan:

## 2021-09-16 LAB — HEPATIC FUNCTION PANEL
AG Ratio: 2.2 (calc) (ref 1.0–2.5)
ALT: 31 U/L (ref 9–46)
AST: 21 U/L (ref 10–35)
Albumin: 4.9 g/dL (ref 3.6–5.1)
Alkaline phosphatase (APISO): 35 U/L (ref 35–144)
Bilirubin, Direct: 0.1 mg/dL (ref 0.0–0.2)
Globulin: 2.2 g/dL (calc) (ref 1.9–3.7)
Indirect Bilirubin: 0.4 mg/dL (calc) (ref 0.2–1.2)
Total Bilirubin: 0.5 mg/dL (ref 0.2–1.2)
Total Protein: 7.1 g/dL (ref 6.1–8.1)

## 2021-09-16 LAB — CBC WITH DIFFERENTIAL/PLATELET
Absolute Monocytes: 699 cells/uL (ref 200–950)
Basophils Absolute: 64 cells/uL (ref 0–200)
Basophils Relative: 0.5 %
Eosinophils Absolute: 305 cells/uL (ref 15–500)
Eosinophils Relative: 2.4 %
HCT: 46.8 % (ref 38.5–50.0)
Hemoglobin: 16.1 g/dL (ref 13.2–17.1)
Lymphs Abs: 4305 cells/uL — ABNORMAL HIGH (ref 850–3900)
MCH: 32.1 pg (ref 27.0–33.0)
MCHC: 34.4 g/dL (ref 32.0–36.0)
MCV: 93.4 fL (ref 80.0–100.0)
MPV: 11.1 fL (ref 7.5–12.5)
Monocytes Relative: 5.5 %
Neutro Abs: 7328 cells/uL (ref 1500–7800)
Neutrophils Relative %: 57.7 %
Platelets: 296 10*3/uL (ref 140–400)
RBC: 5.01 10*6/uL (ref 4.20–5.80)
RDW: 12.9 % (ref 11.0–15.0)
Total Lymphocyte: 33.9 %
WBC: 12.7 10*3/uL — ABNORMAL HIGH (ref 3.8–10.8)

## 2021-09-16 LAB — BASIC METABOLIC PANEL
BUN: 21 mg/dL (ref 7–25)
CO2: 21 mmol/L (ref 20–32)
Calcium: 10 mg/dL (ref 8.6–10.3)
Chloride: 103 mmol/L (ref 98–110)
Creat: 1.25 mg/dL (ref 0.70–1.30)
Glucose, Bld: 164 mg/dL — ABNORMAL HIGH (ref 65–99)
Potassium: 4.1 mmol/L (ref 3.5–5.3)
Sodium: 137 mmol/L (ref 135–146)

## 2021-09-16 LAB — LIPID PANEL
Cholesterol: 98 mg/dL (ref <200)
HDL: 40 mg/dL (ref >=40)
LDL Cholesterol (Calc): 30 mg/dL (calc)
Non-HDL Cholesterol (Calc): 58 mg/dL (calc) (ref <130)
Total CHOL/HDL Ratio: 2.5 (calc) (ref <5.0)
Triglycerides: 214 mg/dL — ABNORMAL HIGH (ref <150)

## 2021-09-16 LAB — HEMOGLOBIN A1C
Hgb A1c MFr Bld: 6.6 % of total Hgb — ABNORMAL HIGH (ref <5.7)
Mean Plasma Glucose: 143 mg/dL
eAG (mmol/L): 7.9 mmol/L

## 2021-09-16 LAB — TSH: TSH: 1.71 mIU/L (ref 0.40–4.50)

## 2021-09-18 ENCOUNTER — Telehealth (HOSPITAL_COMMUNITY): Payer: Self-pay | Admitting: *Deleted

## 2021-09-18 NOTE — Telephone Encounter (Signed)
Received fax from [facility], pt needs clearance for R knee arthroplasty with spinal anesthesia and to hold eliquis  Per Dr [name]: pt optimized for surgery from cardiac standpoint, ok to hold Eliquis for 3-5 days  Form faxed back to [phone] Atten: Sharyn Blitz

## 2021-09-20 NOTE — Progress Notes (Signed)
Your procedure is scheduled on:     10/03/21   Report to [facility] Main  Entrance   Report to admitting at  0515 AM     Call this number if you have problems the morning of surgery [phone]    REMEMBER: NO  SOLID FOOD CANDY OR GUM AFTER MIDNIGHT. CLEAR LIQUIDS UNTIL   0430am         . NOTHING BY MOUTH EXCEPT CLEAR LIQUIDS UNTIL  0430am   . PLEASE FINISH ENSURE DRINK PER SURGEON ORDER  WHICH NEEDS TO BE COMPLETED AT    0430am   .      CLEAR LIQUID DIET   Foods Allowed                                                                    Coffee and tea, regular and decaf                            Fruit ices (not with fruit pulp)                                      Iced Popsicles                                    Carbonated beverages, regular and diet                                    Cranberry, grape and apple juices Sports drinks like Gatorade Lightly seasoned clear broth or consume(fat free) Sugar, honey syrup ___________________________________________________________________      BRUSH YOUR TEETH MORNING OF SURGERY AND RINSE YOUR MOUTH OUT, NO CHEWING GUM CANDY OR MINTS.     Take these medicines the morning of surgery with A SIP OF WATER:  allopurinol, pacerone, coretg, zyrtec, synthroid, protonix   DO NOT TAKE ANY DIABETIC MEDICATIONS DAY OF YOUR SURGERY                               You may not have any metal on your body including hair pins and              piercings  Do not wear jewelry, make-up, lotions, powders or perfumes, deodorant             Do not wear nail polish on your fingernails.  Do not shave  48 hours prior to surgery.              Men may shave face and neck.   Do not bring valuables to the hospital. [facility] IS NOT             RESPONSIBLE   FOR VALUABLES.  Contacts, dentures or bridgework may not be worn into surgery.  Leave suitcase in the car. After surgery it may be brought to your room.     Patients discharged  the  day of surgery will not be allowed to drive home. IF YOU ARE HAVING SURGERY AND GOING HOME THE SAME DAY, YOU MUST HAVE AN ADULT TO DRIVE YOU HOME AND BE WITH YOU FOR 24 HOURS. YOU MAY GO HOME BY TAXI OR UBER OR ORTHERWISE, BUT AN ADULT MUST ACCOMPANY YOU HOME AND STAY WITH YOU FOR 24 HOURS.  Name and phone number of your driver:  Special Instructions: N/A              Please read over the following fact sheets you were given: _____________________________________________________________________  [facility] - Preparing for Surgery Before surgery, you can play an important role.  Because skin is not sterile, your skin needs to be as free of germs as possible.  You can reduce the number of germs on your skin by washing with CHG (chlorahexidine gluconate) soap before surgery.  CHG is an antiseptic cleaner which kills germs and bonds with the skin to continue killing germs even after washing. Please DO NOT use if you have an allergy to CHG or antibacterial soaps.  If your skin becomes reddened/irritated stop using the CHG and inform your nurse when you arrive at Short Stay. Do not shave (including legs and underarms) for at least 48 hours prior to the first CHG shower.  You may shave your face/neck. Please follow these instructions carefully:  1.  Shower with CHG Soap the night before surgery and the  morning of Surgery.  2.  If you choose to wash your hair, wash your hair first as usual with your  normal  shampoo.  3.  After you shampoo, rinse your hair and body thoroughly to remove the  shampoo.                           4.  Use CHG as you would any other liquid soap.  You can apply chg directly  to the skin and wash                       Gently with a scrungie or clean washcloth.  5.  Apply the CHG Soap to your body ONLY FROM THE NECK DOWN.   Do not use on face/ open                           Wound or open sores. Avoid contact with eyes, ears mouth and genitals (private parts).                        Wash face,  Genitals (private parts) with your normal soap.             6.  Wash thoroughly, paying special attention to the area where your surgery  will be performed.  7.  Thoroughly rinse your body with warm water from the neck down.  8.  DO NOT shower/wash with your normal soap after using and rinsing off  the CHG Soap.                9.  Pat yourself dry with a clean towel.            10.  Wear clean pajamas.            11.  Place clean sheets on your bed the night of your first shower and do not  sleep with pets. Day of Surgery :  Do not apply any lotions/deodorants the morning of surgery.  Please wear clean clothes to the hospital/surgery center.  FAILURE TO FOLLOW THESE INSTRUCTIONS MAY RESULT IN THE CANCELLATION OF YOUR SURGERY PATIENT SIGNATURE_________________________________  NURSE SIGNATURE__________________________________  ________________________________________________________________________

## 2021-09-20 NOTE — Progress Notes (Addendum)
Anesthesia Review:  PCP: DR Neena Rhymes  LOV 09/15/21  Cardiologist :clearance- Telephone encounter 09/18/21  DR Jesusita Oka Bensimhon DR Sherryl Manges - LOV 03/28/21   LOV 04/27/21  Chest x-ray :09/25/21  EKG :03/28/21  08/24/21- Device check  PT has ICD Device orders requested on 09/25/21.   Echo :04/27/21  Stress test: Cardiac Cath :  Activity level: pt can do a flight of stairs without difficulty  Sleep Study/ CPAP : none  Fasting Blood Sugar :      / Checks Blood Sugar -- times a day:   Blood Thinner/ Instructions /Last Dose: ASA / Instructions/ Last Dose :   DM- type 2- pt states " I maybe check it every 6 months "  Hgba1c- 09/15/21-6.6  Eliquis- pt has not been given instructions .  Instructed pt to call cardiologist and/or Dr Jerl Santos office for instructions on Eliquis.  PT voiced understanding.  81 mg Aspirin  No covid test - ambulatory surgery  CBC done 09/25/21- routed to Dr  Jerl Santos.,  [facility]

## 2021-09-21 ENCOUNTER — Other Ambulatory Visit (HOSPITAL_COMMUNITY): Payer: Self-pay | Admitting: Internal Medicine

## 2021-09-25 ENCOUNTER — Encounter (HOSPITAL_COMMUNITY): Payer: Self-pay

## 2021-09-25 ENCOUNTER — Ambulatory Visit (HOSPITAL_COMMUNITY)
Admission: RE | Admit: 2021-09-25 | Discharge: 2021-09-25 | Disposition: A | Discharge status: Home / Self Care | Payer: BC Managed Care – PPO | Source: Ambulatory Visit | Attending: Orthopaedic Surgery | Admitting: Orthopaedic Surgery

## 2021-09-25 ENCOUNTER — Encounter: Payer: Self-pay | Admitting: Internal Medicine

## 2021-09-25 ENCOUNTER — Other Ambulatory Visit: Payer: Self-pay

## 2021-09-25 ENCOUNTER — Encounter (HOSPITAL_COMMUNITY)
Admission: RE | Admit: 2021-09-25 | Discharge: 2021-09-25 | Disposition: A | Discharge status: Home / Self Care | Payer: BC Managed Care – PPO | Source: Ambulatory Visit | Attending: Orthopaedic Surgery | Admitting: Orthopaedic Surgery

## 2021-09-25 VITALS — BP 106/74 | HR 65 | Temp 98.2°F | Resp 18 | Ht 74.0 in | Wt 240.0 lb

## 2021-09-25 DIAGNOSIS — Z01818 Encounter for other preprocedural examination: Secondary | ICD-10-CM

## 2021-09-25 HISTORY — DX: Transient cerebral ischemic attack, unspecified: G45.9

## 2021-09-25 HISTORY — DX: Presence of automatic (implantable) cardiac defibrillator: Z95.810

## 2021-09-25 LAB — CBC WITH DIFFERENTIAL/PLATELET
Abs Immature Granulocytes: 0.12 10*3/uL — ABNORMAL HIGH (ref 0.00–0.07)
Basophils Absolute: 0.1 10*3/uL (ref 0.0–0.1)
Basophils Relative: 1 %
Eosinophils Absolute: 0.3 10*3/uL (ref 0.0–0.5)
Eosinophils Relative: 2 %
HCT: 46.3 % (ref 39.0–52.0)
Hemoglobin: 15.6 g/dL (ref 13.0–17.0)
Immature Granulocytes: 1 %
Lymphocytes Relative: 32 %
Lymphs Abs: 3.9 10*3/uL (ref 0.7–4.0)
MCH: 30.9 pg (ref 26.0–34.0)
MCHC: 33.7 g/dL (ref 30.0–36.0)
MCV: 91.7 fL (ref 80.0–100.0)
Monocytes Absolute: 0.7 10*3/uL (ref 0.1–1.0)
Monocytes Relative: 5 %
Neutro Abs: 7.4 10*3/uL (ref 1.7–7.7)
Neutrophils Relative %: 59 %
Platelets: 295 10*3/uL (ref 150–400)
RBC: 5.05 MIL/uL (ref 4.22–5.81)
RDW: 13.1 % (ref 11.5–15.5)
WBC: 12.4 10*3/uL — ABNORMAL HIGH (ref 4.0–10.5)
nRBC: 0 % (ref 0.0–0.2)

## 2021-09-25 LAB — BASIC METABOLIC PANEL
Anion gap: 8 (ref 5–15)
BUN: 20 mg/dL (ref 6–20)
CO2: 25 mmol/L (ref 22–32)
Calcium: 9.7 mg/dL (ref 8.9–10.3)
Chloride: 103 mmol/L (ref 98–111)
Creatinine, Ser: 1.09 mg/dL (ref 0.61–1.24)
GFR, Estimated: >60 mL/min (ref >60)
Glucose, Bld: 119 mg/dL — ABNORMAL HIGH (ref 70–99)
Potassium: 4.2 mmol/L (ref 3.5–5.1)
Sodium: 136 mmol/L (ref 135–145)

## 2021-09-25 LAB — PROTIME-INR
INR: 1 (ref 0.8–1.2)
Prothrombin Time: 13.3 seconds (ref 11.4–15.2)

## 2021-09-25 LAB — URINALYSIS, ROUTINE W REFLEX MICROSCOPIC
Bacteria, UA: NONE SEEN
Bilirubin Urine: NEGATIVE
Glucose, UA: >=500 mg/dL — AB
Hgb urine dipstick: NEGATIVE
Ketones, ur: NEGATIVE mg/dL
Leukocytes,Ua: NEGATIVE
Nitrite: NEGATIVE
Protein, ur: NEGATIVE mg/dL
Specific Gravity, Urine: 1.009 (ref 1.005–1.030)
pH: 7 (ref 5.0–8.0)

## 2021-09-25 LAB — SURGICAL PCR SCREEN
MRSA, PCR: NEGATIVE
Staphylococcus aureus: POSITIVE — AB

## 2021-09-25 LAB — APTT: aPTT: 31 seconds (ref 24–36)

## 2021-09-25 LAB — GLUCOSE, CAPILLARY: Glucose-Capillary: 122 mg/dL — ABNORMAL HIGH (ref 70–99)

## 2021-09-25 IMAGING — DX DG CHEST 2V
2 series · 2 of 2 positions shown · non-contrast
Comparison: X-ray chest [DATE].

CLINICAL DATA: Preoperative examination.

EXAM:
CHEST - 2 VIEW

[chest pa]
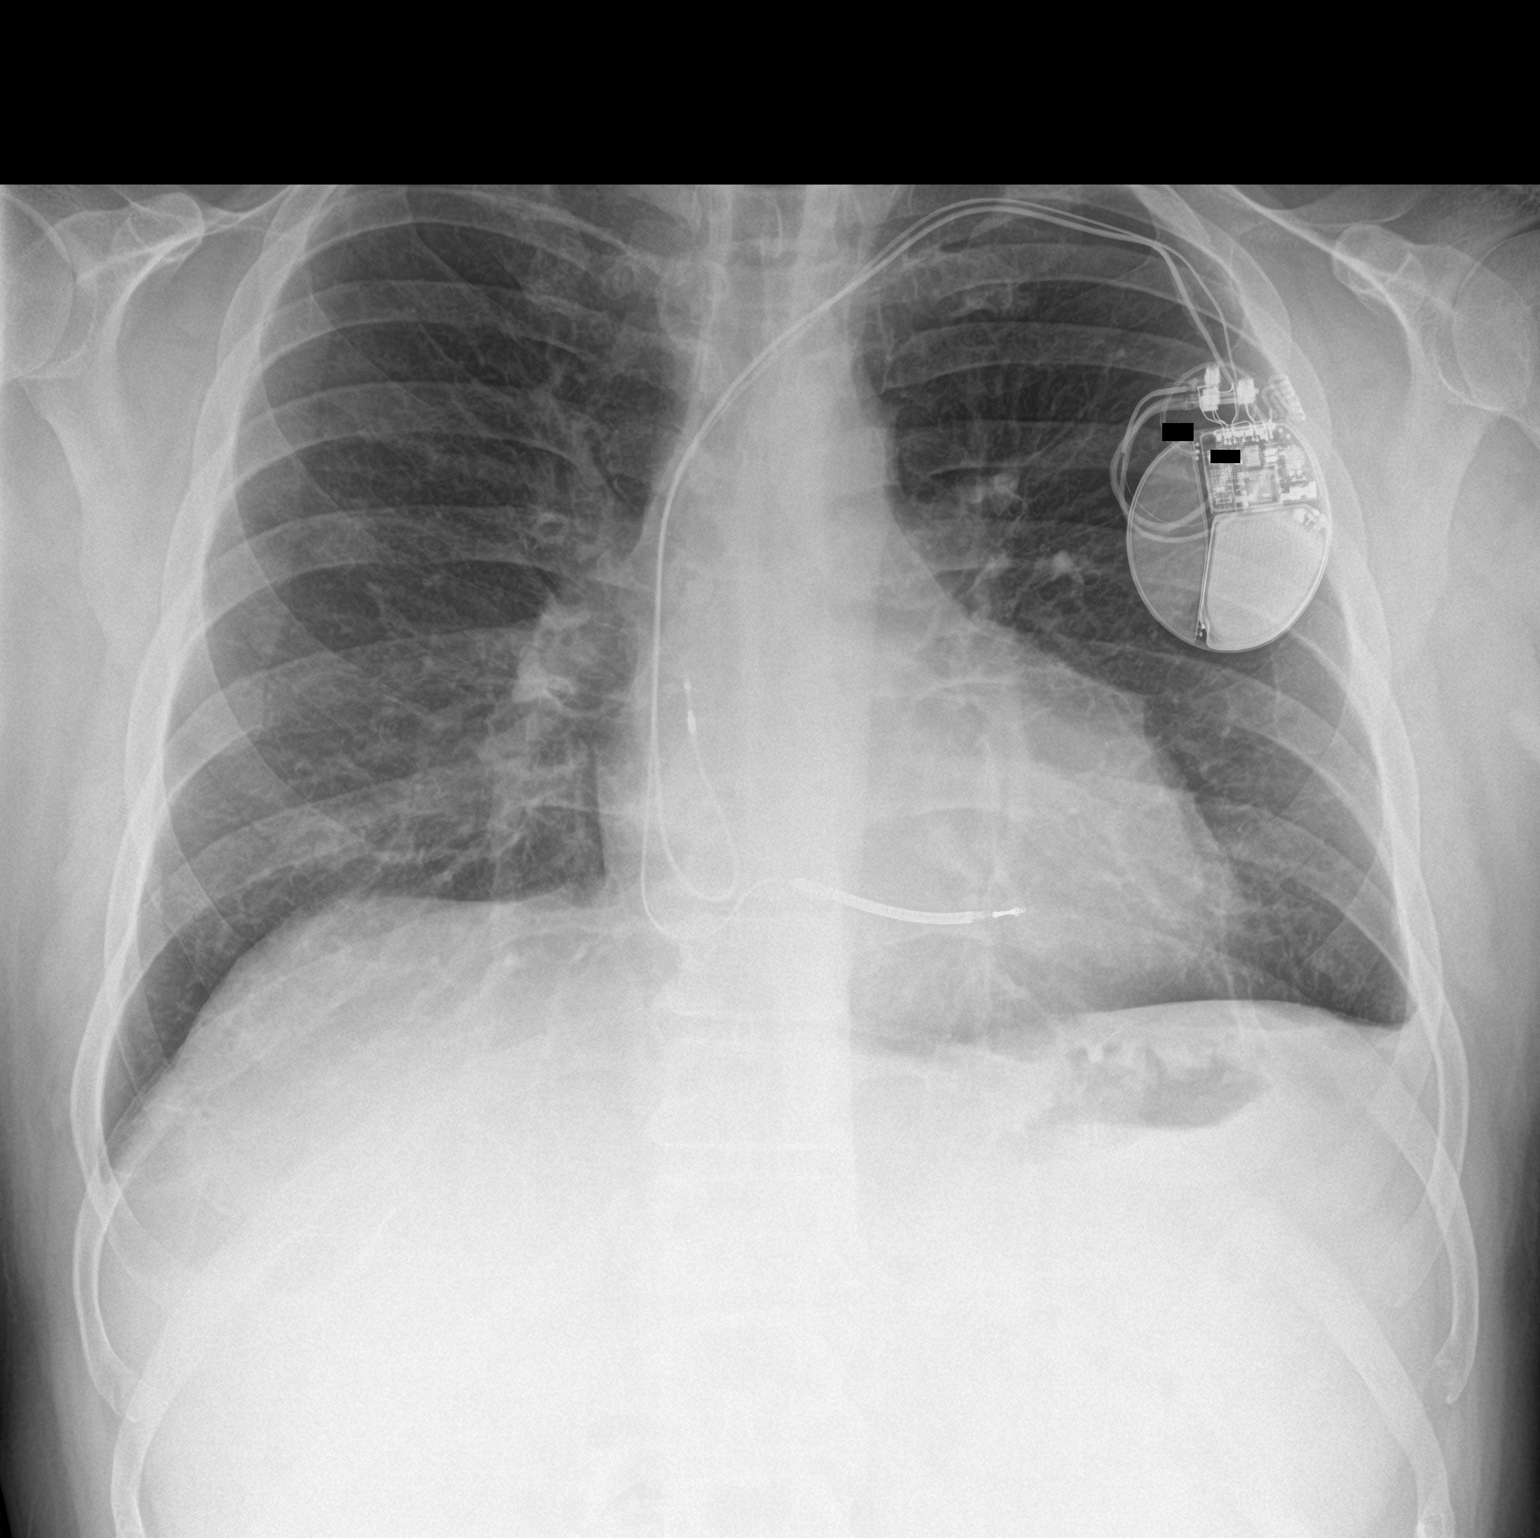

[chest lat]
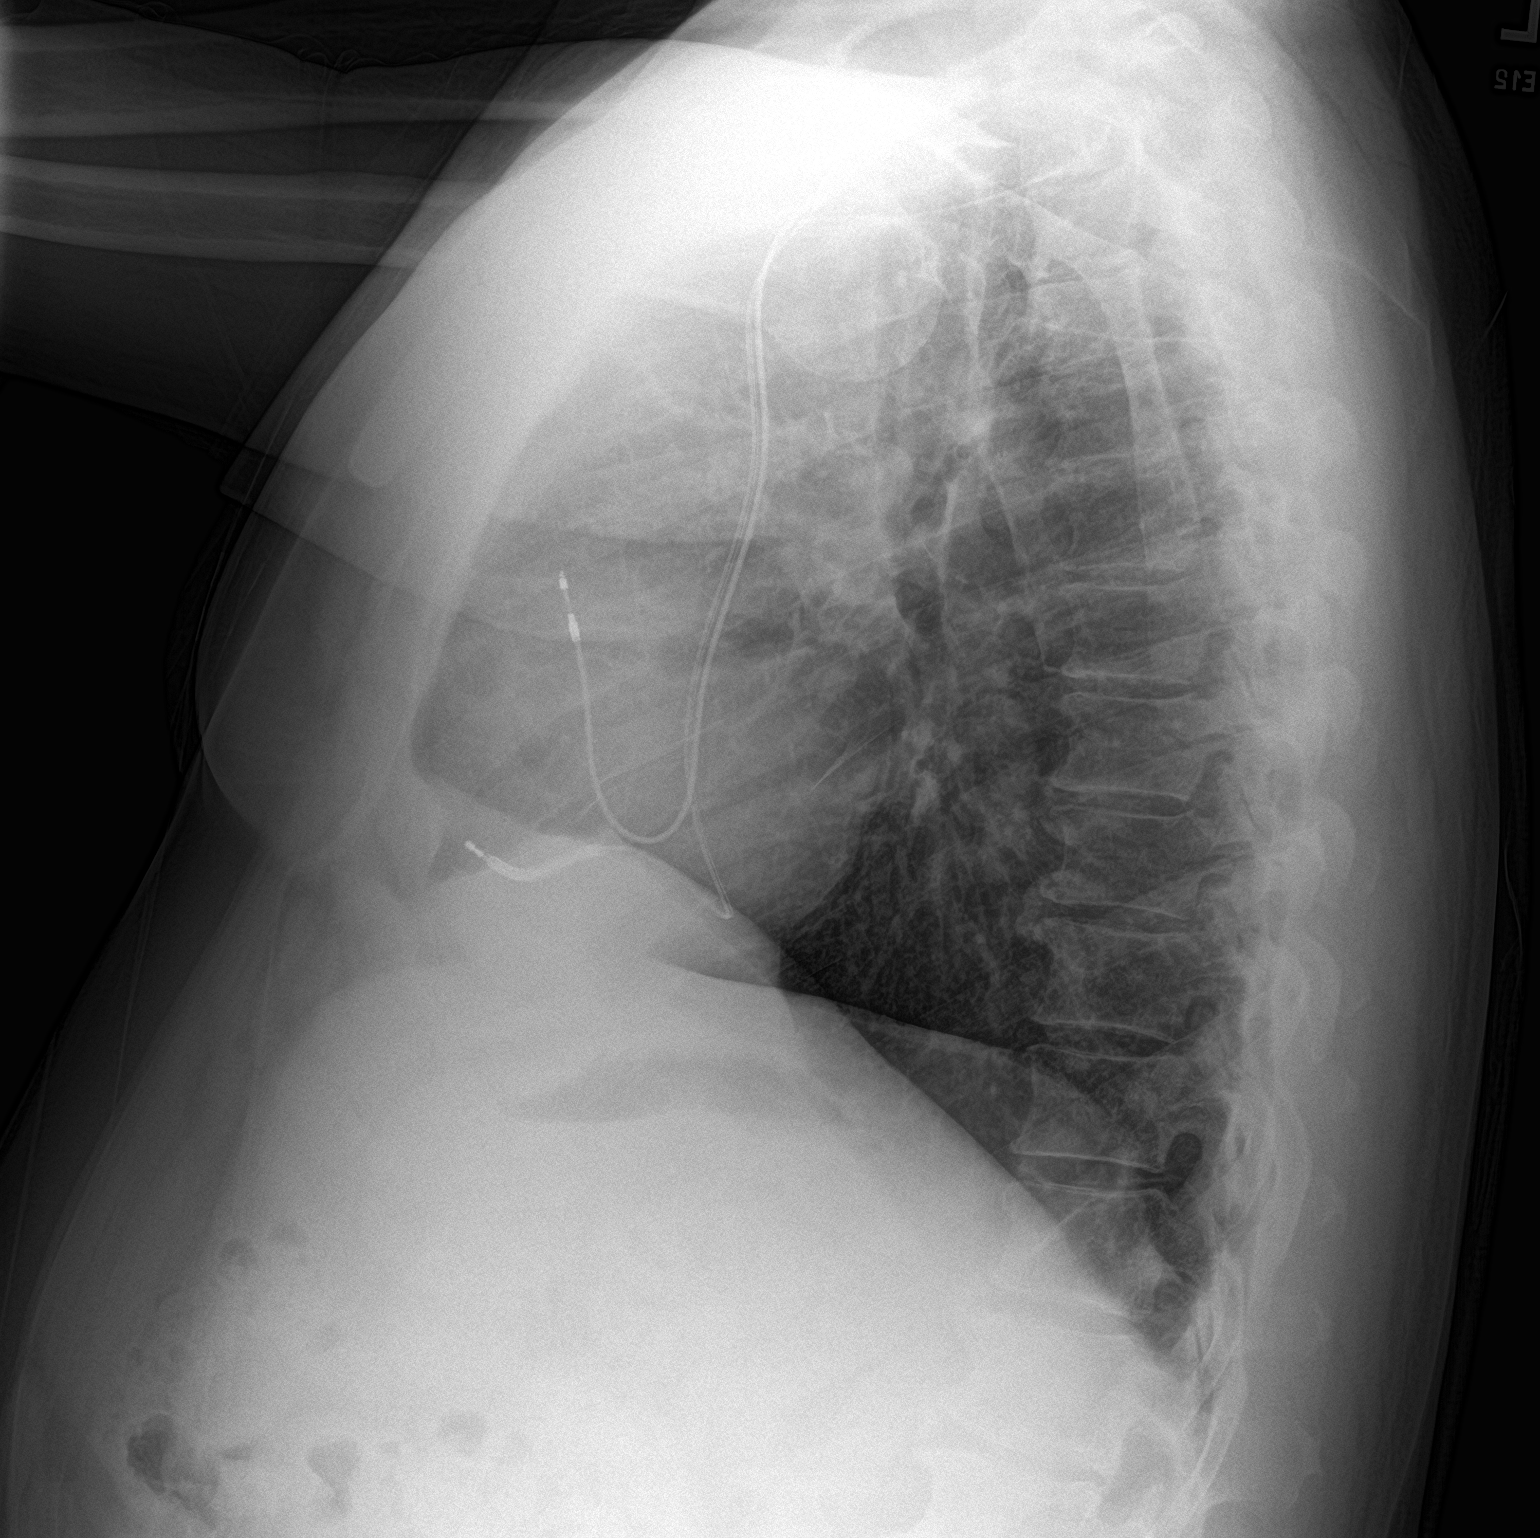

[2 of 2 positions shown; findings below may reference images not displayed]

FINDINGS: Unchanged positioning of a left-sided implanted cardiac device.
Heart size is normal. Chronic blunting of the left costophrenic
angle. No focal airspace consolidation, pleural effusion, or
pneumothorax.
IMPRESSION: No active cardiopulmonary disease.

## 2021-09-25 NOTE — Progress Notes (Signed)
PERIOPERATIVE PRESCRIPTION FOR IMPLANTED CARDIAC DEVICE PROGRAMMING  Patient Information: Name:  Barclay Lennox  DOB:  [birth year]  MRN:  [id number]    Planned Procedure:  right total knee arthroplasty  Surgeon:  Dr Marcene Corning  Date of Procedure:  10/03/21  Cautery will be used.  Position during surgery:   Please send documentation back to:  [facility] (Fax # [phone])  [facility]  Monia Pouch, RN  09/25/2021 2:37 PM  Device Information:  Clinic EP Physician:  Sherryl Manges, MD   Device Type:  Defibrillator Manufacturer and Phone #:  [employer]: [phone] Pacemaker Dependent?:  No. Date of Last Device Check:  08/24/21 (remote) Normal Device Function?:  Yes.    Electrophysiologist's Recommendations:  Have magnet available. Provide continuous ECG monitoring when magnet is used or reprogramming is to be performed.  Procedure should not interfere with device function.  No device programming or magnet placement needed.  Per Device Clinic Standing Orders, Lenor Coffin, RN  5:05 PM 09/25/2021

## 2021-09-26 ENCOUNTER — Encounter (HOSPITAL_COMMUNITY): Payer: Self-pay

## 2021-09-26 NOTE — Progress Notes (Signed)
Anesthesia Chart Review   Case: [id number] Date/Time: 10/03/21 0715   Procedure: RIGHT TOTAL KNEE ARTHROPLASTY (Right: Knee)   Anesthesia type: Spinal   Pre-op diagnosis: RIGHT KNEE DEGENERATIVE JOINT DISEASE   Location: [facility] ROOM 06 / [facility]   Surgeons: Melrose Nakayama, MD       DISCUSSION:55 y.o. former smoker with h/o GERD, DM II (A1C 6.6), h/o polysubstance abuse, CHF (EF 40 to 45%), Oncologist in place (device orders in 09/25/2021 progress note), PE, right knee djd scheduled for above procedure 10/03/21 with Dr. Melrose Nakayama.   Per cardiologist, "pt optimized for surgery from cardiac standpoint, ok to hold Eliquis for 3-5 days."  Anticipate pt can proceed with planned procedure barring acute status change.   VS: BP 106/74   Pulse 65   Temp 36.8 C (Oral)   Resp 18   Ht 6\' 2"  (1.88 m)   Wt 108.9 kg   SpO2 99%   BMI 30.81 kg/m   PROVIDERS: Midge Minium, MD is PCP   Glori Bickers, MD is Cardiologist  LABS: Labs reviewed: Acceptable for surgery. (all labs ordered are listed, but only abnormal results are displayed)  Labs Reviewed  SURGICAL PCR SCREEN - Abnormal; Notable for the following components:      Result Value   Staphylococcus aureus POSITIVE (*)    All other components within normal limits  CBC WITH DIFFERENTIAL/PLATELET - Abnormal; Notable for the following components:   WBC 12.4 (*)    Abs Immature Granulocytes 0.12 (*)    All other components within normal limits  BASIC METABOLIC PANEL - Abnormal; Notable for the following components:   Glucose, Bld 119 (*)    All other components within normal limits  URINALYSIS, ROUTINE W REFLEX MICROSCOPIC - Abnormal; Notable for the following components:   Color, Urine STRAW (*)    Glucose, UA >=500 (*)    All other components within normal limits  GLUCOSE, CAPILLARY - Abnormal; Notable for the following components:   Glucose-Capillary 122 (*)    All other components within normal  limits  PROTIME-INR  APTT  TYPE AND SCREEN     IMAGES:   EKG: 03/28/2021 Rate 63 bpm   CV: Echo 04/27/2021  1. Left ventricular ejection fraction, by estimation, is 40 to 45%. The  left ventricle has mildly decreased function. The left ventricle has no  regional wall motion abnormalities. Left ventricular diastolic parameters  are consistent with Grade I  diastolic dysfunction (impaired relaxation). There is hypokinesis of the  left ventricular, entire inferior wall.   2. Right ventricular systolic function is normal. The right ventricular  size is normal.   3. Left atrial size was mild to moderately dilated.   4. Right atrial size was mildly dilated.   5. The mitral valve is normal in structure. Trivial mitral valve  regurgitation. No evidence of mitral stenosis.   6. The aortic valve is normal in structure. Aortic valve regurgitation is  not visualized. No aortic stenosis is present.   7. The inferior vena cava is normal in size with greater than 50%  respiratory variability, suggesting right atrial pressure of 3 mmHg.  Past Medical History:  Diagnosis Date   AICD (automatic cardioverter/defibrillator) present    Allergy    Arthritis    CHF (congestive heart failure) (HCC)    CHF (congestive heart failure) (HCC)    Diabetes mellitus without complication (HCC)    Drug abuse ([place])    Eczema    GERD (gastroesophageal  reflux disease)    Gout    Heart attack (HCC) 06/10/2020   History of kidney stones    Hyperlipidemia    MI (myocardial infarction) (HCC)    PE (pulmonary embolism)    TIA (transient ischemic attack)    hx of per pt - pt not aware he had not followed by a neurologist    Past Surgical History:  Procedure Laterality Date   CERVICAL FUSION     ICD IMPLANT N/A 08/22/2020   Procedure: ICD IMPLANT;  Surgeon: Duke Salvia, MD;  Location: [facility] INVASIVE CV LAB;  Service: Cardiovascular;  Laterality: N/A;   KNEE ARTHROSCOPY Right 10/28/2020   Procedure:  RIGHT KNEE ARTHROSCOPY, PARTIAL MEDIAL MENISCECTOMY, CHONDROPLASTY;  Surgeon: Marcene Corning, MD;  Location: [facility];  Service: Orthopedics;  Laterality: Right;   left thumb surgery      RIGHT/LEFT HEART CATH AND CORONARY ANGIOGRAPHY N/A 07/01/2020   Procedure: RIGHT/LEFT HEART CATH AND CORONARY ANGIOGRAPHY;  Surgeon: Dolores Patty, MD;  Location: [facility] INVASIVE CV LAB;  Service: Cardiovascular;  Laterality: N/A;   SHOULDER ARTHROSCOPY Left    VASECTOMY Bilateral    WISDOM TOOTH EXTRACTION      MEDICATIONS:  Alirocumab (PRALUENT) 150 MG/ML SOAJ   allopurinol (ZYLOPRIM) 300 MG tablet   amiodarone (PACERONE) 200 MG tablet   aspirin EC 81 MG tablet   atorvastatin (LIPITOR) 80 MG tablet   calcium carbonate (TUMS - DOSED IN MG ELEMENTAL CALCIUM) 500 MG chewable tablet   carvedilol (COREG) 12.5 MG tablet   cetirizine (ZYRTEC) 10 MG tablet   ELIQUIS 5 MG TABS tablet   ENTRESTO 49-51 MG   FARXIGA 10 MG TABS tablet   fenofibrate 160 MG tablet   HYDROcodone-acetaminophen (NORCO/VICODIN) 5-325 MG tablet   icosapent Ethyl (VASCEPA) 1 g capsule   levothyroxine (SYNTHROID) 50 MCG tablet   meloxicam (MOBIC) 15 MG tablet   metFORMIN (GLUCOPHAGE) 1000 MG tablet   methocarbamol (ROBAXIN) 500 MG tablet   Naphazoline HCl (CLEAR EYES OP)   pantoprazole (PROTONIX) 40 MG tablet   sodium chloride (OCEAN) 0.65 % SOLN nasal spray   spironolactone (ALDACTONE) 25 MG tablet   traZODone (DESYREL) 100 MG tablet   triamcinolone (KENALOG) 0.025 % ointment   No current facility-administered medications for this encounter.    Jodell Cipro Ward, PA-C [facility]-Surgical Testing [phone]

## 2021-09-26 NOTE — Anesthesia Preprocedure Evaluation (Addendum)
Anesthesia Evaluation  Patient identified by MRN, date of birth, ID band Patient awake    Reviewed: Allergy & Precautions, NPO status , Patient's Chart, lab work & pertinent test results  Airway Mallampati: II  TM Distance: >3 FB Neck ROM: Full    Dental no notable dental hx. (+) Teeth Intact, Dental Advisory Given   Pulmonary Current SmokerPatient did not abstain from smoking., former smoker,    Pulmonary exam normal breath sounds clear to auscultation       Cardiovascular hypertension, + CAD and + Past MI (7 /2021)  Normal cardiovascular exam+ Cardiac Defibrillator  Rhythm:Regular Rate:Normal  05/03/21 TTE  1. Left ventricular ejection fraction, by estimation, is 40 to 45%. The  left ventricle has mildly decreased function. The left ventricle has no  regional wall motion abnormalities. Left ventricular diastolic parameters  are consistent with Grade I  diastolic dysfunction (impaired relaxation). There is hypokinesis of the  left ventricular, entire inferior wall.  2. Right ventricular systolic function is normal. The right ventricular  size is normal.  3. Left atrial size was mild to moderately dilated.  4. Right atrial size was mildly dilated.  5. The mitral valve is normal in structure. Trivial mitral valve  regurgitation. No evidence of mitral stenosis.  6. The aortic valve is normal in structure. Aortic valve regurgitation is  not visualized. No aortic stenosis is present.  7. The inferior vena cava is normal in size with greater than 50%  respiratory variability, suggesting right atrial pressure of 3 mmHg.   Ischemic cardiomyopathy   Neuro/Psych TIA   GI/Hepatic GERD  ,(+)     substance abuse  ,   Endo/Other  diabetes  Renal/GU      Musculoskeletal  (+) Arthritis ,   Abdominal   Peds  Hematology   Anesthesia Other Findings   Reproductive/Obstetrics                            Anesthesia Physical Anesthesia Plan  ASA: 3  Anesthesia Plan: Spinal   Post-op Pain Management:  Regional for Post-op pain   Induction: Intravenous  PONV Risk Score and Plan: 2 and Treatment may vary due to age or medical condition, Midazolam and Ondansetron  Airway Management Planned: Nasal Cannula and Natural Airway  Additional Equipment: None  Intra-op Plan:   Post-operative Plan:   Informed Consent: I have reviewed the patients History and Physical, chart, labs and discussed the procedure including the risks, benefits and alternatives for the proposed anesthesia with the patient or authorized representative who has indicated his/her understanding and acceptance.     Dental advisory given  Plan Discussed with:   Anesthesia Plan Comments: (See PAT note 09/25/2021, Jodell Cipro Ward, PA-C  Spinal w R Adductor Canal)      Anesthesia Quick Evaluation

## 2021-09-28 NOTE — Telephone Encounter (Signed)
Called and spoke with pt. Clearance was faxed on 11/7 to [facility]. Note from anesthesia preop has clearance. Also called dr.Dalldorfs office and left vm for Rebecca.

## 2021-10-02 ENCOUNTER — Other Ambulatory Visit: Payer: Self-pay | Admitting: Family Medicine

## 2021-10-02 NOTE — H&P (Signed)
TOTAL KNEE ADMISSION H&P  Patient is being admitted for right total knee arthroplasty.  Subjective:  Chief Complaint:right knee pain.  HPI: Jason Floyd, 55 y.o. male, has a history of pain and functional disability in the right knee due to arthritis and has failed non-surgical conservative treatments for greater than 12 weeks to includeNSAID's and/or analgesics, corticosteriod injections, viscosupplementation injections, flexibility and strengthening excercises, supervised PT with diminished ADL's post treatment, use of assistive devices, weight reduction as appropriate, and activity modification.  Onset of symptoms was gradual, starting 5 years ago with gradually worsening course since that time. The patient noted prior procedures on the knee to include  arthroscopy on the right knee(s).  Patient currently rates pain in the right knee(s) at 10 out of 10 with activity. Patient has night pain, worsening of pain with activity and weight bearing, pain that interferes with activities of daily living, crepitus, and joint swelling.  Patient has evidence of subchondral cysts, subchondral sclerosis, periarticular osteophytes, and joint space narrowing by imaging studies. There is no active infection.  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 12/05/2020   Coronary artery disease involving native coronary artery of native heart without angina pectoris 09/21/2020   Chronic combined systolic and diastolic congestive heart failure (HCC) 09/21/2020   V-tach 09/21/2020   Nasal septal perforation 08/10/2020   TIA (transient ischemic attack) 06/17/2020   GERD (gastroesophageal reflux disease) 08/05/2019   HTN (hypertension) 04/16/2018   Insomnia 01/22/2018   Controlled diabetes mellitus type 2 with complications (HCC) 07/07/2015   Thoracic back pain  04/19/2014   Degenerative disc disease, cervical 08/21/2012   Routine general medical examination at a health care facility 04/17/2012   Hyperlipidemia 04/17/2012   Neck pain 04/17/2012   OBESITY 12/07/2009   FASCIITIS, PLANTAR 03/29/2009   PULMONARY EMBOLISM, HX OF 11/18/2008   NEPHROLITHIASIS, HX OF 11/18/2008   DRUG ABUSE, HX OF 11/18/2008   GOUT 03/22/2008   NUMMULAR ECZEMA 03/22/2008   Past Medical History:  Diagnosis Date   AICD (automatic cardioverter/defibrillator) present    Allergy    Arthritis    CHF (congestive heart failure) (HCC)    CHF (congestive heart failure) (HCC)    Diabetes mellitus without complication (HCC)    Drug abuse (HCC)    Eczema    GERD (gastroesophageal reflux disease)    Gout    Heart attack (HCC) 06/10/2020   History of kidney stones    Hyperlipidemia    MI (myocardial infarction) (HCC)    PE (pulmonary embolism)    TIA (transient ischemic attack)    hx of per pt - pt not aware he had not followed by a neurologist    Past Surgical History:  Procedure Laterality Date   CERVICAL FUSION     ICD IMPLANT N/A 08/22/2020   Procedure: ICD IMPLANT;  Surgeon: Duke Salvia, MD;  Location: [facility]VAS[facility] CV LAB;  Service: Cardiovascular;  Laterality: N/A;   KNEE ARTHROSCOPY Right 10/28/2020   Procedure: RIGHT KNEE ARTHROSCOPY, PARTIAL MEDIAL MENISCECTOMY, CHONDROPLASTY;  Surgeon: Marcene Corning, MD;  Location: [facility] ORS;  Service: Orthopedics;  Laterality: Right;   left thumb surgery      RIGHT/LEFT HEART CATH AND CORONARY ANGIOGRAPHY N/A 07/01/2020   Procedure: RIGHT/LEFT HEART CATH AND CORONARY ANGIOGRAPHY;  Surgeon: Dolores Patty, MD;  Location: [facility] INVASIVE CV LAB;  Service: Cardiovascular;  Laterality: N/A;  SHOULDER ARTHROSCOPY Left    VASECTOMY Bilateral    WISDOM TOOTH EXTRACTION      No current facility-administered medications for this encounter.   Current Outpatient Medications  Medication Sig Dispense Refill Last Dose    Alirocumab (PRALUENT) 150 MG/ML SOAJ Inject 150 mg into the skin every 14 (fourteen) days. 6 mL 3    allopurinol (ZYLOPRIM) 300 MG tablet TAKE 2 TABLETS BY MOUTH DAILY. 60 tablet 6    amiodarone (PACERONE) 200 MG tablet Take 0.5 tablets (100 mg total) by mouth daily. 45 tablet 3    aspirin EC 81 MG tablet Take 81 mg by mouth daily. Swallow whole.      calcium carbonate (TUMS - DOSED IN MG ELEMENTAL CALCIUM) 500 MG chewable tablet Chew 2-3 tablets by mouth daily as needed for indigestion or heartburn.      carvedilol (COREG) 12.5 MG tablet Take 1.5 tablets (18.75 mg total) by mouth 2 (two) times daily with a meal. 540 tablet 2    cetirizine (ZYRTEC) 10 MG tablet Take 10 mg by mouth daily.      ENTRESTO 49-51 MG TAKE 1 TABLET BY MOUTH 2 TIMES DAILY. 60 tablet 11    FARXIGA 10 MG TABS tablet TAKE 1 TABLET (10 MG TOTAL) BY MOUTH DAILY BEFORE BREAKFAST. 30 tablet 0    fenofibrate 160 MG tablet TAKE 1 TABLET BY MOUTH DAILY. 90 tablet 1    HYDROcodone-acetaminophen (NORCO/VICODIN) 5-325 MG tablet Take 1 tablet by mouth every 6 (six) hours as needed for moderate pain.      icosapent Ethyl (VASCEPA) 1 g capsule Take 2 capsules (2 g total) by mouth 2 (two) times daily. 360 capsule 3    levothyroxine (SYNTHROID) 50 MCG tablet Take 1 tablet (50 mcg total) by mouth daily. Take 1 tablet 30 minutes before breakfast and all other pills. 90 tablet 3    meloxicam (MOBIC) 15 MG tablet TAKE 1 TABLET (15 MG TOTAL) BY MOUTH DAILY AS NEEDED FOR PAIN. 15 tablet 3    metFORMIN (GLUCOPHAGE) 1000 MG tablet TAKE 1 TABLET BY MOUTH 2 TIMES DAILY WITH A MEAL. 180 tablet 0    methocarbamol (ROBAXIN) 500 MG tablet TAKE 1 TABLET BY MOUTH EVERY 6 HOURS AS NEEDED FOR MUSCLE SPASMS. 90 tablet 1    Naphazoline HCl (CLEAR EYES OP) Place 1 drop into both eyes daily as needed (sore eyes).      pantoprazole (PROTONIX) 40 MG tablet TAKE 1 TABLET BY MOUTH DAILY. 30 tablet 2    sodium chloride (OCEAN) 0.65 % SOLN nasal spray Place 1 spray  into both nostrils as needed (dryness).      spironolactone (ALDACTONE) 25 MG tablet Take 1 tablet (25 mg total) by mouth daily. 30 tablet 11    traZODone (DESYREL) 100 MG tablet TAKE 1 TABLET BY MOUTH AT BEDTIME. 90 tablet 1    triamcinolone (KENALOG) 0.025 % ointment Apply 1 application topically daily as needed (irritation).      atorvastatin (LIPITOR) 80 MG tablet TAKE 1 TABLET BY MOUTH DAILY. 30 tablet 11    ELIQUIS 5 MG TABS tablet TAKE 1 TABLET BY MOUTH 2 TIMES A DAY 60 tablet 11    No Known Allergies  Social History   Tobacco Use   Smoking status: Former    Packs/day: 0.25    Years: 31.00    Pack years: 7.75    Types: Cigarettes   Smokeless tobacco: Never   Tobacco comments:    Stopped a few weeks ago  Substance Use Topics   Alcohol use: Yes    Comment: occas    Family History  Problem Relation Age of Onset   Dementia Mother        frontal-temporal   Stroke Mother    Breast cancer Maternal Aunt    Other Brother        overdose   Colon cancer Neg Hx      Review of Systems  Musculoskeletal:  Positive for arthralgias.       Right knee  All other systems reviewed and are negative.  Objective:  Physical Exam Constitutional:      Appearance: Normal appearance.  HENT:     Head: Normocephalic and atraumatic.     Nose: Nose normal.     Mouth/Throat:     Pharynx: Oropharynx is clear.  Eyes:     Extraocular Movements: Extraocular movements intact.     Conjunctiva/sclera: Conjunctivae normal.  Cardiovascular:     Rate and Rhythm: Normal rate.  Pulmonary:     Effort: Pulmonary effort is normal.  Abdominal:     Palpations: Abdomen is soft.  Musculoskeletal:     Cervical back: Normal range of motion.     Comments: Right knee has an abrasion across the anterior aspect.  He has 1+ effusion with motion from 0-130.  McMurray's test causes pain and he has medial and lateral joint line pain to palpation.  There is some crepitation.  Hip motion is full and straight leg  raise is negative.  He has healed arthroscopic portals.    Skin:    General: Skin is warm and dry.  Neurological:     General: No focal deficit present.     Mental Status: He is alert and oriented to person, place, and time. Mental status is at baseline.  Psychiatric:        Mood and Affect: Mood normal.        Behavior: Behavior normal.        Thought Content: Thought content normal.        Judgment: Judgment normal.    Vital signs in last 24 hours:    Labs:   Estimated body mass index is 30.81 kg/m as calculated from the following:   Height as of 09/25/21: 6\' 2"  (1.88 m).   Weight as of 09/25/21: 108.9 kg.   Imaging Review Plain radiographs demonstrate severe degenerative joint disease of the right knee(s). The overall alignment isneutral. The bone quality appears to be good for age and reported activity level.   Assessment/Plan:  End stage primary arthritis, right knee   The patient history, physical examination, clinical judgment of the provider and imaging studies are consistent with end stage degenerative joint disease of the right knee(s) and total knee arthroplasty is deemed medically necessary. The treatment options including medical management, injection therapy arthroscopy and arthroplasty were discussed at length. The risks and benefits of total knee arthroplasty were presented and reviewed. The risks due to aseptic loosening, infection, stiffness, patella tracking problems, thromboembolic complications and other imponderables were discussed. The patient acknowledged the explanation, agreed to proceed with the plan and consent was signed. Patient is being admitted for inpatient treatment for surgery, pain control, PT, OT, prophylactic antibiotics, VTE prophylaxis, progressive ambulation and ADL's and discharge planning. The patient is planning to be discharged home with home health services  Patient's anticipated LOS is less than 2 midnights, meeting these  requirements: - Younger than 3 - Lives within 1 hour of care - Has a  competent adult at home to recover with post-op recover - NO history of  - Chronic pain requiring opiods  - Diabetes  - Coronary Artery Disease  - Heart failure  - Heart attack  - Stroke  - DVT/VTE  - Cardiac arrhythmia  - Respiratory Failure/COPD  - Renal failure  - Anemia  - Advanced Liver disease

## 2021-10-03 ENCOUNTER — Observation Stay (HOSPITAL_COMMUNITY)
Admission: RE | Admit: 2021-10-03 | Discharge: 2021-10-03 | Disposition: A | Discharge status: Home Health | Payer: BC Managed Care – PPO | Attending: Orthopaedic Surgery | Admitting: Orthopaedic Surgery

## 2021-10-03 ENCOUNTER — Encounter (HOSPITAL_COMMUNITY)
Admission: RE | Disposition: A | Discharge status: Home / Self Care | Payer: Self-pay | Source: Home / Self Care | Attending: Orthopaedic Surgery

## 2021-10-03 ENCOUNTER — Encounter (HOSPITAL_COMMUNITY): Payer: Self-pay | Admitting: Orthopaedic Surgery

## 2021-10-03 ENCOUNTER — Ambulatory Visit (HOSPITAL_COMMUNITY): Payer: BC Managed Care – PPO | Admitting: Anesthesiology

## 2021-10-03 ENCOUNTER — Ambulatory Visit (HOSPITAL_COMMUNITY): Payer: BC Managed Care – PPO | Admitting: Physician Assistant

## 2021-10-03 DIAGNOSIS — I5042 Chronic combined systolic (congestive) and diastolic (congestive) heart failure: Secondary | ICD-10-CM | POA: Diagnosis not present

## 2021-10-03 DIAGNOSIS — M1711 Unilateral primary osteoarthritis, right knee: Secondary | ICD-10-CM | POA: Diagnosis not present

## 2021-10-03 DIAGNOSIS — Z87891 Personal history of nicotine dependence: Secondary | ICD-10-CM | POA: Insufficient documentation

## 2021-10-03 DIAGNOSIS — Z86711 Personal history of pulmonary embolism: Secondary | ICD-10-CM | POA: Diagnosis not present

## 2021-10-03 DIAGNOSIS — E119 Type 2 diabetes mellitus without complications: Secondary | ICD-10-CM | POA: Insufficient documentation

## 2021-10-03 DIAGNOSIS — Z7982 Long term (current) use of aspirin: Secondary | ICD-10-CM | POA: Insufficient documentation

## 2021-10-03 DIAGNOSIS — Z7901 Long term (current) use of anticoagulants: Secondary | ICD-10-CM | POA: Diagnosis not present

## 2021-10-03 DIAGNOSIS — I110 Hypertensive heart disease with heart failure: Secondary | ICD-10-CM | POA: Diagnosis not present

## 2021-10-03 DIAGNOSIS — Z79899 Other long term (current) drug therapy: Secondary | ICD-10-CM | POA: Diagnosis not present

## 2021-10-03 DIAGNOSIS — I2510 Atherosclerotic heart disease of native coronary artery without angina pectoris: Secondary | ICD-10-CM | POA: Diagnosis not present

## 2021-10-03 DIAGNOSIS — I4819 Other persistent atrial fibrillation: Secondary | ICD-10-CM | POA: Diagnosis not present

## 2021-10-03 DIAGNOSIS — Z95810 Presence of automatic (implantable) cardiac defibrillator: Secondary | ICD-10-CM | POA: Insufficient documentation

## 2021-10-03 DIAGNOSIS — Z7984 Long term (current) use of oral hypoglycemic drugs: Secondary | ICD-10-CM | POA: Insufficient documentation

## 2021-10-03 HISTORY — PX: TOTAL KNEE ARTHROPLASTY: SHX125

## 2021-10-03 LAB — TYPE AND SCREEN
ABO/RH(D): AB POS
Antibody Screen: NEGATIVE

## 2021-10-03 LAB — GLUCOSE, CAPILLARY
Glucose-Capillary: 153 mg/dL — ABNORMAL HIGH (ref 70–99)
Glucose-Capillary: 110 mg/dL — ABNORMAL HIGH (ref 70–99)

## 2021-10-03 LAB — ABO/RH: ABO/RH(D): AB POS

## 2021-10-03 SURGERY — ARTHROPLASTY, KNEE, TOTAL
Anesthesia: Spinal | Site: Knee | Laterality: Right

## 2021-10-03 MED ORDER — LACTATED RINGERS IV BOLUS
500.0000 mL | Freq: Once | INTRAVENOUS | Status: AC
  Administered 2021-10-03: 500 mL via INTRAVENOUS

## 2021-10-03 MED ORDER — CEFAZOLIN SODIUM-DEXTROSE 2-4 GM/100ML-% IV SOLN
2.0000 g | INTRAVENOUS | Status: AC
  Administered 2021-10-03: 2 g via INTRAVENOUS
  Filled 2021-10-03: qty 100

## 2021-10-03 MED ORDER — BUPIVACAINE-EPINEPHRINE 0.5% -1:200000 IJ SOLN
INTRAMUSCULAR | Status: DC | PRN
  Administered 2021-10-03: 30 mL

## 2021-10-03 MED ORDER — 0.9 % SODIUM CHLORIDE (POUR BTL) OPTIME
TOPICAL | Status: DC | PRN
  Administered 2021-10-03: 1000 mL

## 2021-10-03 MED ORDER — SODIUM CHLORIDE (PF) 0.9 % IJ SOLN
INTRAMUSCULAR | Status: AC
  Filled 2021-10-03: qty 30

## 2021-10-03 MED ORDER — KETAMINE HCL 10 MG/ML IJ SOLN
INTRAMUSCULAR | Status: DC | PRN
  Administered 2021-10-03 (×2): 10 mg via INTRAVENOUS

## 2021-10-03 MED ORDER — TRANEXAMIC ACID 1000 MG/10ML IV SOLN
INTRAVENOUS | Status: DC | PRN
  Administered 2021-10-03: 2000 mg via TOPICAL

## 2021-10-03 MED ORDER — HYDROCODONE-ACETAMINOPHEN 5-325 MG PO TABS: 1.0000 | ORAL_TABLET | Freq: Four times a day (QID) | ORAL | 0 refills | Status: DC | PRN

## 2021-10-03 MED ORDER — OXYCODONE HCL 5 MG/5ML PO SOLN: 5.0000 mg | Freq: Once | ORAL | Status: DC | PRN

## 2021-10-03 MED ORDER — OXYCODONE HCL 5 MG PO TABS: 5.0000 mg | ORAL_TABLET | Freq: Once | ORAL | Status: DC | PRN

## 2021-10-03 MED ORDER — LACTATED RINGERS IV SOLN: INTRAVENOUS | Status: DC

## 2021-10-03 MED ORDER — TRANEXAMIC ACID 1000 MG/10ML IV SOLN
2000.0000 mg | INTRAVENOUS | Status: DC
  Filled 2021-10-03 (×2): qty 20

## 2021-10-03 MED ORDER — BUPIVACAINE LIPOSOME 1.3 % IJ SUSP
INTRAMUSCULAR | Status: AC
  Filled 2021-10-03: qty 20

## 2021-10-03 MED ORDER — BUPIVACAINE IN DEXTROSE 0.75-8.25 % IT SOLN
INTRATHECAL | Status: DC | PRN
  Administered 2021-10-03: 1.8 mL via INTRATHECAL

## 2021-10-03 MED ORDER — SODIUM CHLORIDE 0.9 % IR SOLN
Status: DC | PRN
  Administered 2021-10-03 (×2): 1000 mL

## 2021-10-03 MED ORDER — METHOCARBAMOL 500 MG PO TABS: 500.0000 mg | ORAL_TABLET | Freq: Four times a day (QID) | ORAL | Status: DC | PRN

## 2021-10-03 MED ORDER — BUPIVACAINE LIPOSOME 1.3 % IJ SUSP
INTRAMUSCULAR | Status: DC | PRN
  Administered 2021-10-03: 20 mL

## 2021-10-03 MED ORDER — PROPOFOL 500 MG/50ML IV EMUL
INTRAVENOUS | Status: DC | PRN
  Administered 2021-10-03: 100 ug/kg/min via INTRAVENOUS

## 2021-10-03 MED ORDER — FENTANYL CITRATE PF 50 MCG/ML IJ SOSY
50.0000 ug | PREFILLED_SYRINGE | INTRAMUSCULAR | Status: AC
  Administered 2021-10-03 (×2): 50 ug via INTRAVENOUS
  Filled 2021-10-03: qty 2

## 2021-10-03 MED ORDER — POVIDONE-IODINE 10 % EX SWAB
2.0000 "application " | Freq: Once | CUTANEOUS | Status: AC
  Administered 2021-10-03: 2 via TOPICAL

## 2021-10-03 MED ORDER — PHENYLEPHRINE 40 MCG/ML (10ML) SYRINGE FOR IV PUSH (FOR BLOOD PRESSURE SUPPORT)
PREFILLED_SYRINGE | INTRAVENOUS | Status: DC | PRN
  Administered 2021-10-03: 80 ug via INTRAVENOUS
  Administered 2021-10-03 (×2): 120 ug via INTRAVENOUS
  Administered 2021-10-03: 80 ug via INTRAVENOUS

## 2021-10-03 MED ORDER — MIDAZOLAM HCL 2 MG/2ML IJ SOLN
1.0000 mg | INTRAMUSCULAR | Status: AC
  Administered 2021-10-03: 2 mg via INTRAVENOUS
  Filled 2021-10-03: qty 2

## 2021-10-03 MED ORDER — BUPIVACAINE-EPINEPHRINE (PF) 0.25% -1:200000 IJ SOLN
INTRAMUSCULAR | Status: AC
  Filled 2021-10-03: qty 30

## 2021-10-03 MED ORDER — TRANEXAMIC ACID-NACL 1000-0.7 MG/100ML-% IV SOLN
INTRAVENOUS | Status: AC
  Filled 2021-10-03: qty 100

## 2021-10-03 MED ORDER — HYDROMORPHONE HCL 1 MG/ML IJ SOLN: 0.2500 mg | INTRAMUSCULAR | Status: DC | PRN

## 2021-10-03 MED ORDER — SODIUM CHLORIDE (PF) 0.9 % IJ SOLN
INTRAMUSCULAR | Status: DC | PRN
  Administered 2021-10-03: 30 mL

## 2021-10-03 MED ORDER — ONDANSETRON HCL 4 MG/2ML IJ SOLN: 4.0000 mg | Freq: Once | INTRAMUSCULAR | Status: DC | PRN

## 2021-10-03 MED ORDER — KETAMINE HCL 10 MG/ML IJ SOLN
INTRAMUSCULAR | Status: AC
  Filled 2021-10-03: qty 1

## 2021-10-03 MED ORDER — BUPIVACAINE LIPOSOME 1.3 % IJ SUSP: 20.0000 mL | Freq: Once | INTRAMUSCULAR | Status: DC

## 2021-10-03 MED ORDER — LACTATED RINGERS IV BOLUS
250.0000 mL | Freq: Once | INTRAVENOUS | Status: AC
  Administered 2021-10-03: 250 mL via INTRAVENOUS

## 2021-10-03 MED ORDER — EPHEDRINE SULFATE-NACL 50-0.9 MG/10ML-% IV SOSY
PREFILLED_SYRINGE | INTRAVENOUS | Status: DC | PRN
  Administered 2021-10-03: 5 mg via INTRAVENOUS

## 2021-10-03 MED ORDER — CEFAZOLIN SODIUM-DEXTROSE 2-4 GM/100ML-% IV SOLN: 2.0000 g | Freq: Four times a day (QID) | INTRAVENOUS | Status: DC

## 2021-10-03 MED ORDER — AMISULPRIDE (ANTIEMETIC) 5 MG/2ML IV SOLN: 10.0000 mg | Freq: Once | INTRAVENOUS | Status: DC | PRN

## 2021-10-03 MED ORDER — CHLORHEXIDINE GLUCONATE 0.12 % MT SOLN
15.0000 mL | Freq: Once | OROMUCOSAL | Status: AC
  Administered 2021-10-03: 15 mL via OROMUCOSAL

## 2021-10-03 MED ORDER — TRANEXAMIC ACID-NACL 1000-0.7 MG/100ML-% IV SOLN
1000.0000 mg | Freq: Once | INTRAVENOUS | Status: AC
  Administered 2021-10-03: 1000 mg via INTRAVENOUS

## 2021-10-03 MED ORDER — METHOCARBAMOL 500 MG IVPB - SIMPLE MED: 500.0000 mg | Freq: Four times a day (QID) | INTRAVENOUS | Status: DC | PRN

## 2021-10-03 MED ORDER — ACETAMINOPHEN 10 MG/ML IV SOLN: 1000.0000 mg | Freq: Once | INTRAVENOUS | Status: DC | PRN

## 2021-10-03 MED ORDER — TRANEXAMIC ACID-NACL 1000-0.7 MG/100ML-% IV SOLN
1000.0000 mg | INTRAVENOUS | Status: AC
  Administered 2021-10-03: 1000 mg via INTRAVENOUS
  Filled 2021-10-03: qty 100

## 2021-10-03 SURGICAL SUPPLY — 55 items
ATTUNE MED DOME PAT 41 KNEE (Knees) ×1 IMPLANT
ATTUNE PS FEM RT SZ 7 CEM KNEE (Femur) ×1 IMPLANT
ATTUNE PSRP INSR SZ7 6 KNEE (Insert) ×1 IMPLANT
BAG COUNTER SPONGE SURGICOUNT (BAG) ×2 IMPLANT
BAG DECANTER FOR FLEXI CONT (MISCELLANEOUS) ×2 IMPLANT
BAG SPEC THK2 15X12 ZIP CLS (MISCELLANEOUS) ×1
BAG SPNG CNTER NS LX DISP (BAG) ×1
BAG ZIPLOCK 12X15 (MISCELLANEOUS) ×2 IMPLANT
BASE TIBIAL ROT PLAT SZ 8 KNEE (Knees) IMPLANT
BLADE SAGITTAL 25.0X1.19X90 (BLADE) ×2 IMPLANT
BLADE SAW SGTL 11.0X1.19X90.0M (BLADE) ×2 IMPLANT
BLADE SURG SZ10 CARB STEEL (BLADE) ×2 IMPLANT
BNDG ELASTIC 6X5.8 VLCR STR LF (GAUZE/BANDAGES/DRESSINGS) ×2 IMPLANT
BOOTIES KNEE HIGH SLOAN (MISCELLANEOUS) ×2 IMPLANT
BOWL SMART MIX CTS (DISPOSABLE) ×2 IMPLANT
BSPLAT TIB 8 CMNT ROT PLAT STR (Knees) ×1 IMPLANT
CEMENT HV SMART SET (Cement) ×4 IMPLANT
COVER SURGICAL LIGHT HANDLE (MISCELLANEOUS) ×2 IMPLANT
CUFF TOURN SGL QUICK 34 (TOURNIQUET CUFF) ×2
CUFF TRNQT CYL 34X4.125X (TOURNIQUET CUFF) ×1 IMPLANT
DECANTER SPIKE VIAL GLASS SM (MISCELLANEOUS) ×2 IMPLANT
DRAPE INCISE IOBAN 66X45 STRL (DRAPES) ×2 IMPLANT
DRAPE ORTHO 2.5IN SPLIT 77X108 (DRAPES) ×1 IMPLANT
DRAPE ORTHO SPLIT 77X108 STRL (DRAPES) ×2
DRAPE SHEET LG 3/4 BI-LAMINATE (DRAPES) ×2 IMPLANT
DRAPE U-SHAPE 47X51 STRL (DRAPES) ×2 IMPLANT
DRSG AQUACEL AG ADV 3.5X10 (GAUZE/BANDAGES/DRESSINGS) ×2 IMPLANT
DURAPREP 26ML APPLICATOR (WOUND CARE) ×4 IMPLANT
ELECT REM PT RETURN 15FT ADLT (MISCELLANEOUS) ×2 IMPLANT
GLOVE SRG 8 PF TXTR STRL LF DI (GLOVE) ×2 IMPLANT
GLOVE SURG ENC MOIS LTX SZ8 (GLOVE) ×4 IMPLANT
GLOVE SURG UNDER POLY LF SZ8 (GLOVE) ×4
GOWN STRL REUS W/TWL XL LVL3 (GOWN DISPOSABLE) ×4 IMPLANT
HANDPIECE INTERPULSE COAX TIP (DISPOSABLE) ×2
HOLDER FOLEY CATH W/STRAP (MISCELLANEOUS) ×1 IMPLANT
HOOD PEEL AWAY FLYTE STAYCOOL (MISCELLANEOUS) ×6 IMPLANT
KIT TURNOVER KIT A (KITS) IMPLANT
MANIFOLD NEPTUNE II (INSTRUMENTS) ×2 IMPLANT
NEEDLE HYPO 22GX1.5 SAFETY (NEEDLE) ×2 IMPLANT
NS IRRIG 1000ML POUR BTL (IV SOLUTION) ×2 IMPLANT
PACK TOTAL KNEE CUSTOM (KITS) ×2 IMPLANT
PAD ARMBOARD 7.5X6 YLW CONV (MISCELLANEOUS) ×2 IMPLANT
PROTECTOR NERVE ULNAR (MISCELLANEOUS) ×2 IMPLANT
SET HNDPC FAN SPRY TIP SCT (DISPOSABLE) ×1 IMPLANT
SUT ETHIBOND NAB CT1 #1 30IN (SUTURE) ×4 IMPLANT
SUT VIC AB 0 CT1 36 (SUTURE) ×2 IMPLANT
SUT VIC AB 2-0 CT1 27 (SUTURE) ×2
SUT VIC AB 2-0 CT1 TAPERPNT 27 (SUTURE) ×1 IMPLANT
SUT VICRYL AB 3-0 FS1 BRD 27IN (SUTURE) ×2 IMPLANT
SUT VLOC 180 0 24IN GS25 (SUTURE) ×2 IMPLANT
TIBIAL BASE ROT PLAT SZ 8 KNEE (Knees) ×2 IMPLANT
TRAY FOLEY MTR SLVR 16FR STAT (SET/KITS/TRAYS/PACK) ×1 IMPLANT
WATER STERILE IRR 1000ML POUR (IV SOLUTION) ×3 IMPLANT
WRAP KNEE MAXI GEL POST OP (GAUZE/BANDAGES/DRESSINGS) ×2 IMPLANT
YANKAUER SUCT BULB TIP NO VENT (SUCTIONS) ×2 IMPLANT

## 2021-10-03 NOTE — Interval H&P Note (Signed)
History and Physical Interval Note:  10/03/2021 9:17 AM  Jason Floyd  has presented today for surgery, with the diagnosis of RIGHT KNEE DEGENERATIVE JOINT DISEASE.  The various methods of treatment have been discussed with the patient and family. After consideration of risks, benefits and other options for treatment, the patient has consented to  Procedure(s): RIGHT TOTAL KNEE ARTHROPLASTY (Right) as a surgical intervention.  The patient's history has been reviewed, patient examined, no change in status, stable for surgery.  I have reviewed the patient's chart and labs.  Questions were answered to the patient's satisfaction.     Velna Ochs

## 2021-10-03 NOTE — Op Note (Signed)
PREOP DIAGNOSIS: DJD RIGHT KNEE POSTOP DIAGNOSIS: same PROCEDURE: RIGHT TKR ANESTHESIA: Spinal and MAC ATTENDING SURGEON: Hessie Dibble ASSISTANT: Loni Dolly PA  INDICATIONS FOR PROCEDURE: Jason Floyd is a 55 y.o. male who has struggled for a long time with pain due to degenerative arthritis of the right knee.  The patient has failed many conservative non-operative measures and at this point has pain which limits the ability to sleep and walk.  The patient is offered total knee replacement.  Informed operative consent was obtained after discussion of possible risks of anesthesia, infection, neurovascular injury, DVT, and death.  The importance of the post-operative rehabilitation protocol to optimize result was stressed extensively with the patient.  SUMMARY OF FINDINGS AND PROCEDURE:  Norval Slaven was taken to the operative suite where under the above anesthesia a right knee replacement was performed.  There were advanced degenerative changes and the bone quality was excellent.  We used the DePuy Attune system and placed size 7 femur, 8 tibia, 41 mm all polyethylene patella, and a size 6 mm spacer.  Loni Dolly PA-C assisted throughout and was invaluable to the completion of the case in that he helped retract and maintain exposure while I placed components.  He also helped close thereby minimizing OR time.  The patient was admitted for appropriate post-op care to include perioperative antibiotics and mechanical and pharmacologic measures for DVT prophylaxis.  DESCRIPTION OF PROCEDURE:  Evonte Prestage was taken to the operative suite where the above anesthesia was applied.  The patient was positioned supine and prepped and draped in normal sterile fashion.  An appropriate time out was performed.  After the administration of kefzol pre-op antibiotic the leg was elevated and exsanguinated and a tourniquet inflated. A standard longitudinal incision was made on the anterior knee.  Dissection was  carried down to the extensor mechanism.  All appropriate anti-infective measures were used including the pre-operative antibiotic, betadine impregnated drape, and closed hooded exhaust systems for each member of the surgical team.  A medial parapatellar incision was made in the extensor mechanism and the knee cap flipped and the knee flexed.  Some residual meniscal tissues were removed along with any remaining ACL/PCL tissue.  A guide was placed on the tibia and a flat cut was made on it's superior surface.  An intramedullary guide was placed in the femur and was utilized to make anterior and posterior cuts creating an appropriate flexion gap.  A second intramedullary guide was placed in the femur to make a distal cut properly balancing the knee with an extension gap equal to the flexion gap.  The three bones sized to the above mentioned sizes and the appropriate guides were placed and utilized.  A trial reduction was done and the knee easily came to full extension and the patella tracked well on flexion.  The trial components were removed and all bones were cleaned with pulsatile lavage and then dried thoroughly.  Cement was mixed and was pressurized onto the bones followed by placement of the aforementioned components.  Excess cement was trimmed and pressure was held on the components until the cement had hardened.  The tourniquet was deflated and a small amount of bleeding was controlled with cautery and pressure.  The knee was irrigated thoroughly.  The extensor mechanism was re-approximated with #1 ethibond in interrupted fashion.  The knee was flexed and the repair was solid.  The subcutaneous tissues were re-approximated with #0 and #2-0 vicryl and the skin closed with a subcuticular stitch  and steristrips.  A sterile dressing was applied.  Intraoperative fluids, EBL, and tourniquet time can be obtained from anesthesia records.  DISPOSITION:  The patient was taken to recovery room in stable condition and  admitted for appropriate post-op care to include peri-operative antibiotic and DVT prophylaxis with mechanical and pharmacologic measures.  Hessie Dibble 10/03/2021, 11:49 AM

## 2021-10-03 NOTE — Anesthesia Procedure Notes (Addendum)
Spinal  Patient location during procedure: OR Start time: 10/03/2021 10:20 AM End time: 10/03/2021 10:22 AM Reason for block: surgical anesthesia Staffing Performed: resident/CRNA  Resident/CRNA: Lucinda Dell, CRNA Preanesthetic Checklist Completed: patient identified, IV checked, site marked, risks and benefits discussed, surgical consent, monitors and equipment checked, pre-op evaluation and timeout performed Spinal Block Patient position: sitting Prep: DuraPrep Patient monitoring: heart rate, cardiac monitor, continuous pulse ox and blood pressure Approach: midline Location: L3-4 Injection technique: single-shot Needle Needle type: Pencan  Needle gauge: 24 G Needle length: 10 cm Needle insertion depth: 6 cm Assessment Sensory level: T4 Events: CSF return

## 2021-10-03 NOTE — Transfer of Care (Signed)
Immediate Anesthesia Transfer of Care Note  Patient: Jason Floyd  Procedure(s) Performed: RIGHT TOTAL KNEE ARTHROPLASTY (Right: Knee)  Patient Location: PACU  Anesthesia Type:MAC and Spinal  Level of Consciousness: sedated, patient cooperative and responds to stimulation  Airway & Oxygen Therapy: Patient Spontanous Breathing and Patient connected to face mask oxygen  Post-op Assessment: Report given to RN and Post -op Vital signs reviewed and stable  Post vital signs: Reviewed and stable  Last Vitals:  Vitals Value Taken Time  BP 115/65 10/03/21 1213  Temp    Pulse 58 10/03/21 1214  Resp 10 10/03/21 1214  SpO2 93 % 10/03/21 1214  Vitals shown include unvalidated device data.  Last Pain:  Vitals:   10/03/21 0936  TempSrc:   PainSc: 0-No pain      Patients Stated Pain Goal: 2 (12/12/41 8887)  Complications: No notable events documented.

## 2021-10-03 NOTE — Anesthesia Postprocedure Evaluation (Signed)
Anesthesia Post Note  Patient: Jason Floyd  Procedure(s) Performed: RIGHT TOTAL KNEE ARTHROPLASTY (Right: Knee)     Patient location during evaluation: Nursing Unit Anesthesia Type: Spinal Level of consciousness: oriented and awake and alert Pain management: pain level controlled Vital Signs Assessment: post-procedure vital signs reviewed and stable Respiratory status: spontaneous breathing and respiratory function stable Cardiovascular status: blood pressure returned to baseline and stable Postop Assessment: no headache, no backache, no apparent nausea or vomiting and patient able to bend at knees Anesthetic complications: no   No notable events documented.  Last Vitals:  Vitals:   10/03/21 1245 10/03/21 1327  BP: 111/69 98/82  Pulse: (!) 55 (!) 53  Resp: 15   Temp:    SpO2: 93% 98%    Last Pain:  Vitals:   10/03/21 1327  TempSrc:   PainSc: 0-No pain                 Trevor Iha

## 2021-10-03 NOTE — Anesthesia Procedure Notes (Addendum)
Anesthesia Regional Block: Adductor canal block   Pre-Anesthetic Checklist: , timeout performed,  Correct Patient, Correct Site, Correct Laterality,  Correct Procedure, Correct Position, site marked,  Risks and benefits discussed,  Surgical consent,  Pre-op evaluation,  At surgeon's request and post-op pain management  Laterality: Lower and Right  Prep: chloraprep       Needles:  Injection technique: Single-shot  Needle Type: Echogenic Needle     Needle Length: 9cm  Needle Gauge: 22     Additional Needles:   Procedures:,,,, ultrasound used (permanent image in chart),,    Narrative:  Start time: 10/03/2021 9:23 AM End time: 10/03/2021 9:30 AM Injection made incrementally with aspirations every 5 mL.  Performed by: Personally  Anesthesiologist: Trevor Iha, MD  Additional Notes: Block assessed prior to surgery. Pt tolerated procedure well.

## 2021-10-03 NOTE — Evaluation (Signed)
Physical Therapy Evaluation Patient Details Name: Jason Floyd MRN: [id number] DOB: Aug 12, 1966 Today's Date: 10/03/2021  History of Present Illness   Patient is 55 y.o. male s/p Rt TKA on 10/03/21 with PMH significant for CHF, DM, GERD, gout, HLD, MI, PE, TIA, AICD placement.    Clinical Impression  Jason Floyd is a 55 y.o. male POD 0 s/p Rt TKA. Patient reports independence with mobility at baseline. Patient is now limited by functional impairments (see PT problem list below) and requires min guard/supervision for transfers and gait with RW. Patient was able to ambulate ~100 feet with RW and min guard/supervision and cues for safe walker management. Patient educated on safe sequencing for stair mobility and verbalized safe guarding position for people assisting with mobility. Patient instructed in exercises to facilitate ROM and circulation. Patient will benefit from continued skilled PT interventions to address impairments and progress towards PLOF. Patient has met mobility goals at adequate level for discharge home; will continue to follow if pt continues acute stay to progress towards Mod I goals.         Recommendations for follow up therapy are one component of a multi-disciplinary discharge planning process, led by the attending physician.  Recommendations may be updated based on patient status, additional functional criteria and insurance authorization.    10/03/21 1300  PT Visit Information  Last PT Received On 10/03/21  Assistance Needed +1  History of Present Illness Patient is 55 y.o. male s/p Rt TKA on 10/03/21 with PMH significant for CHF, DM, GERD, gout, HLD, MI, PE, TIA, AICD placement.  Precautions  Precautions Fall  Restrictions  Weight Bearing Restrictions No  Other Position/Activity Restrictions WBAT  Home Living  Family/patient expects to be discharged to: Private residence  Living Arrangements Spouse/significant other  Available Help at Discharge Family  Type  of [place] to enter (split foyer)  Entrance Stairs-Number of Steps 1  Entrance Stairs-Rails None  Home Layout Two level  Alternate Level Stairs-Number of Steps 7  Alternate Level Stairs-Rails Right  Bathroom Shower/Tub Walk-in shower  Bathroom Toilet Handicapped height  Bathroom Accessibility Yes  Home Equipment Shower seat;Rolling [place] (2 wheels)  Additional Comments 13 dogs at the house  Prior Function  Prior Level of Function  Independent/Modified Independent  Communication  Communication No difficulties  Pain Assessment  Pain Assessment 0-10  Pain Score 0  Pain Location Rt knee  Pain Descriptors / Indicators Aching;Discomfort  Pain Intervention(s) Limited activity within patient's tolerance;Monitored during session;Repositioned;Ice applied  Cognition  Arousal/Alertness Awake/alert  Behavior During Therapy WFL for tasks assessed/performed  Overall Cognitive Status Within Functional Limits for tasks assessed  Upper Extremity Assessment  Upper Extremity Assessment Overall WFL for tasks assessed  Lower Extremity Assessment  Lower Extremity Assessment RLE deficits/detail  RLE Deficits / Details good quad activation, no extensor lag with SLR  RLE Sensation WNL  RLE Coordination WNL  Cervical / Trunk Assessment  Cervical / Trunk Assessment Normal  Bed Mobility  Overal bed mobility Needs Assistance  Bed Mobility Supine to Sit  Supine to sit Min guard;Supervision  General bed mobility comments pt taking extra time, cues for use of belt to assist Rt LE off EOB.  Transfers  Overall transfer level Needs assistance  Equipment used Rolling walker (2 wheels)  Transfers Sit to/from Stand  Sit to Stand Min guard;Supervision  General transfer comment cues for hand placement and technique with RW and safe hand placement.  Ambulation/Gait  Ambulation/Gait assistance Min guard;Supervision  Gait  Distance (Feet) 100 Feet  Assistive device Rolling walker (2  wheels)  Gait Pattern/deviations Step-to pattern;Decreased stride length;Decreased weight shift to right  General Gait Details cues for safe step pattern and proximity to RW, pt progressed to step through with no LOB.  Gait velocity decr  Stairs Yes  Stairs assistance Min guard;Supervision  Stair Management One rail Right;Step to pattern;Sideways  Number of Stairs 2  General stair comments cues for step to pattern "up with good, down with bad" and stair sequencing with RW for support. min guard/assist for Rt  PT - End of Session  Equipment Utilized During Treatment Gait belt  Activity Tolerance Patient tolerated treatment well  Patient left in chair;with call bell/phone within reach  Nurse Communication Mobility status  PT Assessment  PT Recommendation/Assessment Patient needs continued PT services  PT Visit Diagnosis Muscle weakness (generalized) (M62.81);Difficulty in walking, not elsewhere classified (R26.2)  PT Problem List Decreased strength;Decreased range of motion;Decreased activity tolerance;Decreased balance;Decreased mobility;Decreased knowledge of use of DME;Decreased knowledge of precautions;Pain  PT Plan  PT Frequency (ACUTE ONLY) 7X/week  PT Treatment/Interventions (ACUTE ONLY) DME instruction;Gait training;Functional mobility training;Stair training;Therapeutic activities;Therapeutic exercise;Balance training;Patient/family education  AM-PAC PT "6 Clicks" Mobility Outcome Measure (Version 2)  Help needed turning from your back to your side while in a flat bed without using bedrails? 4  Help needed moving from lying on your back to sitting on the side of a flat bed without using bedrails? 3  Help needed moving to and from a bed to a chair (including a wheelchair)? 3  Help needed standing up from a chair using your arms (e.g., wheelchair or bedside chair)? 3  Help needed to walk in hospital room? 3  Help needed climbing 3-5 steps with a railing?  3  6 Click Score 19   Consider Recommendation of Discharge To: [facility] with [facility]  Progressive Mobility  What is the highest level of mobility based on the progressive mobility assessment? Level 5 (Walks with assist in room/hall) - Balance while stepping forward/back and can walk in room with assist - Complete  Mobility Ambulated with assistance in hallway;Ambulated with assistance in room;Out of bed to chair with meals;Out of bed for toileting  PT Recommendation  Follow Up Recommendations Follow physician's recommendations for discharge plan and follow up therapies  Assistance recommended at discharge Intermittent Supervision/Assistance  Functional Status Assessment Patient has had a recent decline in their functional status and demonstrates the ability to make significant improvements in function in a reasonable and predictable amount of time.  PT equipment None recommended by PT  Individuals Consulted  Consulted and Agree with Results and Recommendations Patient  Acute Rehab PT Goals  Patient Stated Goal go home today  PT Goal Formulation With patient  Time For Goal Achievement 10/10/21  Potential to Achieve Goals Good  PT Time Calculation  PT Start Time (ACUTE ONLY) 1350  PT Stop Time (ACUTE ONLY) 1430  PT Time Calculation (min) (ACUTE ONLY) 40 min  PT General Charges  $$ ACUTE PT VISIT 1 Visit  PT Evaluation  $PT Eval Low Complexity 1 Low  PT Treatments  $Gait Training 8-22 mins  $Therapeutic Exercise 8-22 mins  Written Expression  Dominant Hand Right    Verner Mould, DPT Acute Rehabilitation Services Office [phone] Pager [phone]    Jacques Navy 10/03/2021, 5:39 PM

## 2021-10-03 NOTE — Progress Notes (Signed)
AssistedDr. Houser with right, ultrasound guided, adductor canal block. Side rails up, monitors on throughout procedure. See vital signs in flow sheet. Tolerated Procedure well.  

## 2021-10-04 ENCOUNTER — Encounter (HOSPITAL_COMMUNITY): Payer: Self-pay | Admitting: Orthopaedic Surgery

## 2021-10-11 ENCOUNTER — Other Ambulatory Visit: Payer: Self-pay | Admitting: Family Medicine

## 2021-10-11 ENCOUNTER — Other Ambulatory Visit: Payer: Self-pay | Admitting: Internal Medicine

## 2021-10-20 ENCOUNTER — Other Ambulatory Visit: Payer: Self-pay | Admitting: Family Medicine

## 2021-10-30 ENCOUNTER — Other Ambulatory Visit: Payer: Self-pay | Admitting: Family Medicine

## 2021-11-02 ENCOUNTER — Other Ambulatory Visit: Payer: Self-pay

## 2021-11-02 ENCOUNTER — Encounter (HOSPITAL_COMMUNITY): Payer: Self-pay | Admitting: Internal Medicine

## 2021-11-02 ENCOUNTER — Ambulatory Visit (HOSPITAL_COMMUNITY)
Admission: RE | Admit: 2021-11-02 | Discharge: 2021-11-02 | Disposition: A | Discharge status: Home / Self Care | Payer: BC Managed Care – PPO | Source: Ambulatory Visit | Attending: Internal Medicine | Admitting: Internal Medicine

## 2021-11-02 VITALS — BP 118/80 | HR 77 | Wt 232.0 lb

## 2021-11-02 DIAGNOSIS — I4819 Other persistent atrial fibrillation: Secondary | ICD-10-CM | POA: Insufficient documentation

## 2021-11-02 DIAGNOSIS — Z7984 Long term (current) use of oral hypoglycemic drugs: Secondary | ICD-10-CM | POA: Insufficient documentation

## 2021-11-02 DIAGNOSIS — I255 Ischemic cardiomyopathy: Secondary | ICD-10-CM | POA: Diagnosis not present

## 2021-11-02 DIAGNOSIS — I2510 Atherosclerotic heart disease of native coronary artery without angina pectoris: Secondary | ICD-10-CM | POA: Insufficient documentation

## 2021-11-02 DIAGNOSIS — Z716 Tobacco abuse counseling: Secondary | ICD-10-CM | POA: Diagnosis not present

## 2021-11-02 DIAGNOSIS — F17210 Nicotine dependence, cigarettes, uncomplicated: Secondary | ICD-10-CM | POA: Diagnosis not present

## 2021-11-02 DIAGNOSIS — Z7901 Long term (current) use of anticoagulants: Secondary | ICD-10-CM | POA: Insufficient documentation

## 2021-11-02 DIAGNOSIS — Z95810 Presence of automatic (implantable) cardiac defibrillator: Secondary | ICD-10-CM | POA: Insufficient documentation

## 2021-11-02 DIAGNOSIS — F1911 Other psychoactive substance abuse, in remission: Secondary | ICD-10-CM | POA: Diagnosis not present

## 2021-11-02 DIAGNOSIS — E119 Type 2 diabetes mellitus without complications: Secondary | ICD-10-CM | POA: Diagnosis not present

## 2021-11-02 DIAGNOSIS — I5022 Chronic systolic (congestive) heart failure: Secondary | ICD-10-CM | POA: Diagnosis present

## 2021-11-02 DIAGNOSIS — Z79899 Other long term (current) drug therapy: Secondary | ICD-10-CM | POA: Insufficient documentation

## 2021-11-02 DIAGNOSIS — Z7982 Long term (current) use of aspirin: Secondary | ICD-10-CM | POA: Insufficient documentation

## 2021-11-02 DIAGNOSIS — I472 Ventricular tachycardia, unspecified: Secondary | ICD-10-CM | POA: Insufficient documentation

## 2021-11-02 DIAGNOSIS — E039 Hypothyroidism, unspecified: Secondary | ICD-10-CM | POA: Diagnosis not present

## 2021-11-02 NOTE — Progress Notes (Signed)
ADVANCED HF CLINIC NOTE PCP: Dr. Neena Rhymes HF: Dr. [name] EP: Dr. Graciela Husbands   HPI:  Jason Floyd is 55 year old with a PMH of severe systolic heart failure due to NICM, DM2, former polysubstance and tobacco abuse.    Echo 2008 EF 15% in setting of polysubstance abuse.   ECHO 12/2007 EF 55-60%  Echo 10/20 EF 55-60% no RWMA.   On 06/07/20 had TIA-like symptoms with mild aphasia and weakness (dropped a can with left hand). Went to PCP had carotid u/s and echo. Carotid u/s 06/22/20 1-39% bilaterally. Echo 8/21 EF down to 25-30%. Brain MRI/MRA. Normal vasculature.  Left corona radiata diffusion-weighted/FLAIR hyperintensity is nonspecific. Differential includes active demyelinating lesionversus subacute insult.  Underwent R/L cath 07/01/20 LAD 100% prox LCX 20% RCA 10%  EF 25-30% Ao = 112/72 (90) LV = 111/27 RA = 2 RV = 46/7 PA = 44/12 (27) PCW = 16 Fick cardiac output/index = 6.8/2.9 PVR = 1.5 WU SVR 1047 Ao sat = 96% PA sat = 70%, 78% SVC sat = 74%   After cath underwent cMRI.for viability  - LVEF 26% - LGE suggestive of a very large, wrap-around LAD infarction with extensive no-reflow affecting the subendocardium.   Zio monitored placed for palpitations in 10/21 and found to have VT. Underwent ICD placement by Dr. Graciela Husbands on 08/22/20 Was also found to have volume overload and AF.  Echo 6/22 EF 40-45 Personally reviewed  HERe   ICD interrogated, no VT/VF, afib/atach last 3 months.  1.8 hours activity daily.     Past Medical History:  Diagnosis Date   AICD (automatic cardioverter/defibrillator) present    Allergy    Arthritis    CHF (congestive heart failure) (HCC)    CHF (congestive heart failure) (HCC)    Diabetes mellitus without complication (HCC)    Drug abuse (HCC)    Eczema    GERD (gastroesophageal reflux disease)    Gout    Heart attack (HCC) 06/10/2020   History of kidney stones    Hyperlipidemia    MI (myocardial infarction) (HCC)    PE  (pulmonary embolism)    TIA (transient ischemic attack)    hx of per pt - pt not aware he had not followed by a neurologist    Current Outpatient Medications  Medication Sig Dispense Refill   Alirocumab (PRALUENT) 150 MG/ML SOAJ Inject 150 mg into the skin every 14 (fourteen) days. 6 mL 3   allopurinol (ZYLOPRIM) 300 MG tablet TAKE 2 TABLETS BY MOUTH DAILY. 60 tablet 6   amiodarone (PACERONE) 200 MG tablet Take 0.5 tablets (100 mg total) by mouth daily. 45 tablet 3   aspirin EC 81 MG tablet Take 81 mg by mouth daily. Swallow whole.     atorvastatin (LIPITOR) 80 MG tablet TAKE 1 TABLET BY MOUTH DAILY. 30 tablet 11   calcium carbonate (TUMS - DOSED IN MG ELEMENTAL CALCIUM) 500 MG chewable tablet Chew 2-3 tablets by mouth daily as needed for indigestion or heartburn.     carvedilol (COREG) 12.5 MG tablet TAKE 1 AND 1/2 TABLETS BY MOUTH 2 TIMES DAILY WITH A MEAL. 270 tablet 2   cetirizine (ZYRTEC) 10 MG tablet Take 10 mg by mouth daily.     ELIQUIS 5 MG TABS tablet TAKE 1 TABLET BY MOUTH 2 TIMES A DAY 60 tablet 11   ENTRESTO 49-51 MG TAKE 1 TABLET BY MOUTH 2 TIMES DAILY. 60 tablet 11   FARXIGA 10 MG TABS tablet TAKE 1 TABLET  BY MOUTH DAILY BEFORE BREAKFAST. 30 tablet 0   fenofibrate 160 MG tablet TAKE 1 TABLET BY MOUTH DAILY. 90 tablet 1   icosapent Ethyl (VASCEPA) 1 g capsule Take 2 capsules (2 g total) by mouth 2 (two) times daily. 360 capsule 3   levothyroxine (SYNTHROID) 50 MCG tablet Take 1 tablet (50 mcg total) by mouth daily. Take 1 tablet 30 minutes before breakfast and all other pills. 90 tablet 3   metFORMIN (GLUCOPHAGE) 1000 MG tablet TAKE 1 TABLET BY MOUTH 2 TIMES DAILY WITH A MEAL. 180 tablet 0   methocarbamol (ROBAXIN) 500 MG tablet TAKE 1 TABLET BY MOUTH EVERY 6 HOURS AS NEEDED FOR MUSCLE SPASMS. 90 tablet 1   Naphazoline HCl (CLEAR EYES OP) Place 1 drop into both eyes daily as needed (sore eyes).     oxyCODONE-acetaminophen (PERCOCET/ROXICET) 5-325 MG tablet Take 1 tablet by mouth  in the morning, at noon, in the evening, and at bedtime.     pantoprazole (PROTONIX) 40 MG tablet TAKE 1 TABLET BY MOUTH DAILY. 30 tablet 2   sodium chloride (OCEAN) 0.65 % SOLN nasal spray Place 1 spray into both nostrils as needed (dryness).     spironolactone (ALDACTONE) 25 MG tablet Take 1 tablet (25 mg total) by mouth daily. 30 tablet 11   traZODone (DESYREL) 100 MG tablet TAKE 1 TABLET BY MOUTH AT BEDTIME. 90 tablet 1   triamcinolone (KENALOG) 0.025 % ointment Apply 1 application topically daily as needed (irritation).     No current facility-administered medications for this encounter.    No Known Allergies    Social History   Socioeconomic History   Marital status: Married    Spouse name: Jason Floyd   Number of children: Not on file   Years of education: Not on file   Highest education level: Bachelor's degree (e.g., BA, AB, BS)  Occupational History   Not on file  Tobacco Use   Smoking status: Some Days    Packs/day: 0.25    Years: 31.00    Pack years: 7.75    Types: Cigarettes   Smokeless tobacco: Never   Tobacco comments:    Stopped a few weeks ago  Vaping Use   Vaping Use: Never used  Substance and Sexual Activity   Alcohol use: Yes    Comment: occas   Drug use: Not Currently    Types: Marijuana    Comment: prefers not to comment   Sexual activity: Not on file  Other Topics Concern   Not on file  Social History Narrative   Lives with wife   Caffeine- coffee 4 c daily   Social Determinants of Health   Financial Resource Strain: Not on file  Food Insecurity: Not on file  Transportation Needs: Not on file  Physical Activity: Not on file  Stress: Not on file  Social Connections: Not on file  Intimate Partner Violence: Not on file      Family History  Problem Relation Age of Onset   Dementia Mother        frontal-temporal   Stroke Mother    Breast cancer Maternal Aunt    Other Brother        overdose   Colon cancer Neg Hx     Vitals:   11/02/21  1339  BP: 118/80  Pulse: 77  SpO2: 97%  Weight: 105.2 kg (232 lb)    PHYSICAL EXAM: General:  Well appearing. No resp difficulty HEENT: normal Neck: supple. no JVD. Carotids 2+ bilat; no  bruits. No lymphadenopathy or thryomegaly appreciated. Cor: PMI nondisplaced. Regular rate & rhythm. No rubs, gallops or murmurs. Lungs: clear Abdomen: soft, nontender, nondistended. No hepatosplenomegaly. No bruits or masses. Good bowel sounds. Extremities: no cyanosis, clubbing, rash, edema. Left knee in brace Neuro: alert & orientedx3, cranial nerves grossly intact. moves all 4 extremities w/o difficulty. Affect pleasant    ASSESSMENT & PLAN:  Chronic Systolic HF due to iCM - Echo 2008 EF 15% in setting of polysubstance abuse.  - ECHO 12/2007 EF 55-60% - Echo 10/20 EF 55-60% no RWMA.  - Echo 8/21 EF 25-30% - Cath 8/21 LAD 100% LCx 20% RCA 10% - cMRI 08/02/20 EF 26% Large anterior infarct - ICD placed 10/21 for VT - Echo 10/17/20 EF 30-35% - Doing very well. NYHA I - Continue carvedilol 18.75 bid - Continue Entresto 49/51 bid (previously cut back due to low BP) - Continue Farxiga 10 mg daily. - Continue spiro 25 daily - Recent labs ok  - ICD interrogated today. HL = 0 No VT/AF. Fluid ok Personally reviewed  2. CAD - cath as above with subacute LAD infarct and CTO LAD - no s/s ischemia - continue ASA, statin.  - Last LDL 24 (yesterday) last visit referred to lipid clinic and started on praluent and icosapent.  Also on fenofibrate, lipitor 80mg .  TG's better at 182  3. VT - s/p ICD 10/21 - quiescent on amio Last visit decreased to 100mg  daily.  He is followed by Dr. Caryl Comes last seen 03/2021 plan is to continue amio for 9 months and if no VT or afib he will discontinue. - Keep K. 4.0 Mg > 2.0  4. Persistent AF - In NSR here. No AF on ICD - last visit decreased amio to 100 daily.  He is followed by Dr. Caryl Comes last seen 03/2021 plan is to continue amio for 9 months and if no VT or afib he  will discontinue. - Continue Eliquis 5 mg bid. No bleeding - No change  5. Tobacco use - working on quitting. Now 0-1 cigs per day.  - discussed cessation   6. DM2 - continue SGLT2i, metformin  - followed by PCP. Have suggested starting GLP1-RA  7. Hx Possible TIA - carotid dopplers ok - Brain MRI/MRA. Vasculature ok. Findings suspicious for cardio-embolism.   8. Hypothyroidism - likely due to amio - PCP managing synthroid   Glori Bickers, MD  2:59 PM

## 2021-11-02 NOTE — Patient Instructions (Signed)
Medication Changes:  No change  Lab Work:  none  Testing/Procedures:  Your physician has requested that you have an echocardiogram. Echocardiography is a painless test that uses sound waves to create images of your heart. It provides your doctor with information about the size and shape of your heart and how well your hearts chambers and valves are working. This procedure takes approximately one hour. There are no restrictions for this procedure.   Referrals:  none  Special Instructions // Education:  none  Follow-Up in: 6 months (June 2023) with echocardiogram   At the [facility], you and your health needs are our priority. We have a designated team specialized in the treatment of Heart Failure. This Care Team includes your primary Heart Failure Specialized Cardiologist (physician), Advanced Practice Providers (APPs- Physician Assistants and Nurse Practitioners), and Pharmacist who all work together to provide you with the care you need, when you need it.   You may see any of the following providers on your designated Care Team at your next follow up:  Dr Arvilla Meres Dr Carron Curie, NP Robbie Lis, [place] [facility] [place], [place] Karle Plumber, PharmD   Please be sure to bring in all your medications bottles to every appointment.   Need to Contact us:  If you have any questions or concerns before your next appointment please send [place] a message through [place] or call our office at [phone].    TO LEAVE A MESSAGE FOR THE NURSE SELECT OPTION 2, PLEASE LEAVE A MESSAGE INCLUDING: YOUR NAME DATE OF BIRTH CALL BACK NUMBER REASON FOR CALL**this is important as we prioritize the call backs  YOU WILL RECEIVE A CALL BACK THE SAME DAY AS LONG AS YOU CALL BEFORE 4:00 PM

## 2021-11-15 ENCOUNTER — Other Ambulatory Visit: Payer: Self-pay

## 2021-11-15 ENCOUNTER — Other Ambulatory Visit: Payer: Self-pay | Admitting: Family Medicine

## 2021-11-15 MED ORDER — TRAZODONE HCL 100 MG PO TABS: 100.0000 mg | ORAL_TABLET | Freq: Every day | ORAL | 1 refills | Status: DC

## 2021-11-15 MED ORDER — ALLOPURINOL 300 MG PO TABS: 600.0000 mg | ORAL_TABLET | Freq: Every day | ORAL | 6 refills | Status: DC

## 2021-11-23 ENCOUNTER — Ambulatory Visit (INDEPENDENT_AMBULATORY_CARE_PROVIDER_SITE_OTHER): Payer: BC Managed Care – PPO

## 2021-11-23 DIAGNOSIS — I255 Ischemic cardiomyopathy: Secondary | ICD-10-CM

## 2021-11-23 LAB — CUP PACEART REMOTE DEVICE CHECK
Battery Remaining Longevity: 144 mo
Battery Remaining Percentage: 100 %
Brady Statistic RA Percent Paced: 0 %
Brady Statistic RV Percent Paced: 0 %
Date Time Interrogation Session: 20230112102200
HighPow Impedance: 81 Ohm
Implantable Lead Implant Date: 20211011
Implantable Lead Implant Date: 20211011
Implantable Lead Location: 753859
Implantable Lead Location: 753860
Implantable Lead Model: 138
Implantable Lead Model: 7841
Implantable Lead Serial Number: 1097338
Implantable Lead Serial Number: 303261
Implantable Pulse Generator Implant Date: 20211011
Lead Channel Impedance Value: 642 Ohm
Lead Channel Impedance Value: 684 Ohm
Lead Channel Setting Pacing Amplitude: 2.5 V
Lead Channel Setting Pacing Pulse Width: 0.4 ms
Lead Channel Setting Sensing Sensitivity: 0.5 mV
Pulse Gen Serial Number: 228262

## 2021-11-30 ENCOUNTER — Other Ambulatory Visit: Payer: Self-pay | Admitting: Family Medicine

## 2021-12-05 NOTE — Progress Notes (Signed)
Remote ICD transmission.   

## 2021-12-11 ENCOUNTER — Encounter: Payer: Self-pay | Admitting: Family Medicine

## 2021-12-11 DIAGNOSIS — M25511 Pain in right shoulder: Secondary | ICD-10-CM

## 2022-01-02 ENCOUNTER — Other Ambulatory Visit: Payer: Self-pay | Admitting: Family Medicine

## 2022-01-02 DIAGNOSIS — E039 Hypothyroidism, unspecified: Secondary | ICD-10-CM

## 2022-01-15 ENCOUNTER — Other Ambulatory Visit (HOSPITAL_COMMUNITY): Payer: Self-pay | Admitting: Internal Medicine

## 2022-01-16 ENCOUNTER — Encounter: Payer: Self-pay | Admitting: Internal Medicine

## 2022-01-16 ENCOUNTER — Other Ambulatory Visit: Payer: Self-pay

## 2022-01-16 ENCOUNTER — Ambulatory Visit: Payer: BC Managed Care – PPO | Admitting: Internal Medicine

## 2022-01-16 VITALS — BP 106/66 | HR 70 | Ht 74.0 in | Wt 230.4 lb

## 2022-01-16 DIAGNOSIS — Z95810 Presence of automatic (implantable) cardiac defibrillator: Secondary | ICD-10-CM | POA: Diagnosis not present

## 2022-01-16 DIAGNOSIS — I255 Ischemic cardiomyopathy: Secondary | ICD-10-CM

## 2022-01-16 DIAGNOSIS — I472 Ventricular tachycardia, unspecified: Secondary | ICD-10-CM | POA: Diagnosis not present

## 2022-01-16 NOTE — Patient Instructions (Signed)
Medication Instructions:  ?Your physician recommends that you continue on your current medications as directed. Please refer to the Current Medication list given to you today. ? ?*If you need a refill on your cardiac medications before your next appointment, please call your pharmacy* ? ? ?Lab Work: ?None ordered. ? ?If you have labs (blood work) drawn today and your tests are completely normal, you will receive your results only by: ?MyChart Message (if you have MyChart) OR ?A paper copy in the mail ?If you have any lab test that is abnormal or we need to change your treatment, we will call you to review the results. ? ? ?Testing/Procedures: ?None ordered. ? ? ? ?Follow-Up: ?At [facility], you and your health needs are our priority.  As part of our continuing mission to provide you with exceptional heart care, we have created designated Provider Care Teams.  These Care Teams include your primary Cardiologist (physician) and Advanced Practice Providers (APPs -  Physician Assistants and Nurse Practitioners) who all work together to provide you with the care you need, when you need it. ? ?We recommend signing up for the patient portal called "MyChart".  Sign up information is provided on this After Visit Summary.  MyChart is used to connect with patients for Virtual Visits (Telemedicine).  Patients are able to view lab/test results, encounter notes, upcoming appointments, etc.  Non-urgent messages can be sent to your provider as well.   ?To learn more about what you can do with MyChart, go to [email or web address].   ? ?Your next appointment:   ?12 months with Dr Klein's PA ?

## 2022-01-16 NOTE — Progress Notes (Signed)
? ? ? ? ?Patient Care Team: ?Sheliah Hatch, MD as PCP - General (Family Medicine) ?Bensimhon, Bevelyn Buckles, MD as PCP - Advanced Heart Failure (Cardiology) ? ? ?HPI ? ?Jason Floyd is a 56 y.o. male seen in follow-up for an ICD implanted for secondary prevention 10/21 for ventricular tachycardia in the setting of ischemic heart disease, chronic congestive heart failure persistent atrial fibrillation and a prior TIA.  On apixaban without bleeding ? ?Because of recurrent ventricular tachycardia he was started on amiodarone with hopes thereafter of cardioversion for atrial fibrillation--spontaneously converted.  No interval ventricular tachycardia in the heart failure clinic decreased amiodarone to 100 mg daily  ?3/22  ?Patient denies symptoms of GI intolerance, sun sensitivity, neurological symptoms attributable to amiodarone.   ? ?Mostly doing very well with minimal fatigue or dyspnea  No chest pain syncope or palpitations ? ? ?DATE TEST EF   ?8/21 LHC 25-30  % LADp-T; LCx-20;RCA-30  ?8/21 cMRI 26% LGE>> transmural MI  ?12/21 Echo  30-35%   ?6/22 Echo  40 -45%    ? ?Date Cr K Hgb TSH LFTs  ?8/21    2.83 21/57  ?12/21 1.33 4.5 15.9     ?3/22  1.41 4.9 15.8 5.37 25     ?11/22 1.12 4.6  1.71 31  ?  ? ?Records and Results Reviewed  ? ?Past Medical History:  ?Diagnosis Date  ? AICD (automatic cardioverter/defibrillator) present   ? Allergy   ? Arthritis   ? CHF (congestive heart failure) (HCC)   ? CHF (congestive heart failure) (HCC)   ? Diabetes mellitus without complication (HCC)   ? Drug abuse (HCC)   ? Eczema   ? GERD (gastroesophageal reflux disease)   ? Gout   ? Heart attack (HCC) 06/10/2020  ? History of kidney stones   ? Hyperlipidemia   ? MI (myocardial infarction) (HCC)   ? PE (pulmonary embolism)   ? TIA (transient ischemic attack)   ? hx of per pt - pt not aware he had not followed by a neurologist  ? ? ?Past Surgical History:  ?Procedure Laterality Date  ? CERVICAL FUSION    ? ICD IMPLANT N/A 08/22/2020   ? Procedure: ICD IMPLANT;  Surgeon: Duke Salvia, MD;  Location: [facility] INVASIVE CV LAB;  Service: Cardiovascular;  Laterality: N/A;  ? KNEE ARTHROSCOPY Right 10/28/2020  ? Procedure: RIGHT KNEE ARTHROSCOPY, PARTIAL MEDIAL MENISCECTOMY, CHONDROPLASTY;  Surgeon: Marcene Corning, MD;  Location: [facility] ORS;  Service: Orthopedics;  Laterality: Right;  ? left thumb surgery     ? RIGHT/LEFT HEART CATH AND CORONARY ANGIOGRAPHY N/A 07/01/2020  ? Procedure: RIGHT/LEFT HEART CATH AND CORONARY ANGIOGRAPHY;  Surgeon: Dolores Patty, MD;  Location: [facility] INVASIVE CV LAB;  Service: Cardiovascular;  Laterality: N/A;  ? SHOULDER ARTHROSCOPY Left   ? TOTAL KNEE ARTHROPLASTY Right 10/03/2021  ? Procedure: RIGHT TOTAL KNEE ARTHROPLASTY;  Surgeon: Marcene Corning, MD;  Location: [facility] ORS;  Service: Orthopedics;  Laterality: Right;  ? VASECTOMY Bilateral   ? WISDOM TOOTH EXTRACTION    ? ? ?Current Meds  ?Medication Sig  ? Alirocumab (PRALUENT) 150 MG/ML SOAJ Inject 150 mg into the skin every 14 (fourteen) days.  ? allopurinol (ZYLOPRIM) 300 MG tablet Take 2 tablets (600 mg total) by mouth daily.  ? amiodarone (PACERONE) 200 MG tablet Take 0.5 tablets (100 mg total) by mouth daily.  ? aspirin EC 81 MG tablet Take 81 mg by mouth daily. Swallow whole.  ? atorvastatin (LIPITOR) 80 MG  tablet TAKE 1 TABLET BY MOUTH DAILY.  ? calcium carbonate (TUMS - DOSED IN MG ELEMENTAL CALCIUM) 500 MG chewable tablet Chew 2-3 tablets by mouth daily as needed for indigestion or heartburn.  ? carvedilol (COREG) 12.5 MG tablet TAKE 1 AND 1/2 TABLETS BY MOUTH 2 TIMES DAILY WITH A MEAL.  ? cetirizine (ZYRTEC) 10 MG tablet Take 10 mg by mouth daily.  ? ELIQUIS 5 MG TABS tablet TAKE 1 TABLET BY MOUTH 2 TIMES A DAY  ? ENTRESTO 49-51 MG TAKE 1 TABLET BY MOUTH 2 TIMES DAILY.  ? FARXIGA 10 MG TABS tablet TAKE 1 TABLET BY MOUTH DAILY BEFORE BREAKFAST.  ? fenofibrate 160 MG tablet TAKE 1 TABLET BY MOUTH DAILY.  ? icosapent Ethyl (VASCEPA) 1 g capsule TAKE 2 CAPSULES BY  MOUTH 2 TIMES DAILY.  ? levothyroxine (SYNTHROID) 50 MCG tablet TAKE 1 TABLET (50 MCG TOTAL) BY MOUTH DAILY 30 MINUTES BEFORE BREAKFAST AND ALL OTHER PILLS.  ? metFORMIN (GLUCOPHAGE) 1000 MG tablet TAKE 1 TABLET BY MOUTH 2 TIMES DAILY WITH A MEAL.  ? methocarbamol (ROBAXIN) 500 MG tablet TAKE 1 TABLET BY MOUTH EVERY 6 HOURS AS NEEDED FOR MUSCLE SPASMS.  ? Naphazoline HCl (CLEAR EYES OP) Place 1 drop into both eyes daily as needed (sore eyes).  ? oxyCODONE-acetaminophen (PERCOCET/ROXICET) 5-325 MG tablet Take 1 tablet by mouth in the morning, at noon, in the evening, and at bedtime.  ? pantoprazole (PROTONIX) 40 MG tablet TAKE 1 TABLET BY MOUTH DAILY.  ? sodium chloride (OCEAN) 0.65 % SOLN nasal spray Place 1 spray into both nostrils as needed (dryness).  ? spironolactone (ALDACTONE) 25 MG tablet Take 1 tablet (25 mg total) by mouth daily.  ? traZODone (DESYREL) 100 MG tablet Take 1 tablet (100 mg total) by mouth at bedtime.  ? triamcinolone (KENALOG) 0.025 % ointment Apply 1 application topically daily as needed (irritation).  ? ? ?No Known Allergies ? ? ? ?Review of Systems negative except from HPI and PMH ? ?Physical Exam ?BP 106/66   Pulse 70   Ht 6\' 2"  (1.88 m)   Wt 230 lb 6.4 oz (104.5 kg)   SpO2 96%   BMI 29.58 kg/m?  ?Well developed and well nourished in no acute distress ?HENT normal ?Neck supple with JVP-flat ?Clear ?Device pocket well healed; without hematoma or erythema.  There is no tethering  ?Regular rate and rhythm, no murmur ?Abd-soft with active BS ?No Clubbing cyanosis  edema ?Skin-warm and dry ?A & Oriented  Grossly normal sensory and motor function ?  ? ?ECG sinus at 70, ?21/10/43 ?Axis left -87 ?Low voltage ?Poor R wave progression ? ? ?CrCl cannot be calculated (Patient's most recent lab result is older than the maximum 21 days allowed.). ? ? ?Assessment and  Plan ? ?Ischemic cardiomyopathy ? ?Low voltage ? ?Ventricular tachycardia-nonsustained ? ?Atrial  fibrillation-persistent ? ?Congestive heart failure-chronic-systolic-class II ? ?Implantable defibrillator-[employer] ? ?Continue amio 100 day, surveillance labs ok 11/22, for VT NS ? ?No interval afib, continues Apixaban  5 mg bid ? ?Device function normal  ? ?Pt heart failure status is stable. Continue Aldactone 25 mg mg.  Electrolytes are stable.  ? ?Blood pressure on the low side but no significant  symptoms of lightheadedness.  Continue Farxiga carvedilol Entresto at current doses  ? ?He remains on aspirin with his apixaban.  Will reach out to Dr. 12/22 as to whether that needs to be continued ? ?  ?

## 2022-01-17 LAB — CUP PACEART INCLINIC DEVICE CHECK
Date Time Interrogation Session: 20230307000000
HighPow Impedance: 83 Ohm
Implantable Lead Implant Date: 20211011
Implantable Lead Implant Date: 20211011
Implantable Lead Location: 753859
Implantable Lead Location: 753860
Implantable Lead Model: 138
Implantable Lead Model: 7841
Implantable Lead Serial Number: 1097338
Implantable Lead Serial Number: 303261
Implantable Pulse Generator Implant Date: 20211011
Lead Channel Impedance Value: 663 Ohm
Lead Channel Impedance Value: 717 Ohm
Lead Channel Pacing Threshold Amplitude: 0.6 V
Lead Channel Pacing Threshold Amplitude: 0.7 V
Lead Channel Pacing Threshold Pulse Width: 0.4 ms
Lead Channel Pacing Threshold Pulse Width: 0.4 ms
Lead Channel Sensing Intrinsic Amplitude: 20.2 mV
Lead Channel Sensing Intrinsic Amplitude: 7.6 mV
Lead Channel Setting Pacing Amplitude: 2.5 V
Lead Channel Setting Pacing Pulse Width: 0.4 ms
Lead Channel Setting Sensing Sensitivity: 0.5 mV
Pulse Gen Serial Number: 228262

## 2022-01-23 ENCOUNTER — Encounter: Payer: Self-pay | Admitting: Family Medicine

## 2022-01-23 ENCOUNTER — Ambulatory Visit (INDEPENDENT_AMBULATORY_CARE_PROVIDER_SITE_OTHER): Payer: BC Managed Care – PPO | Admitting: Family Medicine

## 2022-01-23 VITALS — BP 110/92 | HR 73 | Temp 97.7°F | Resp 16 | Ht 74.0 in | Wt 231.0 lb

## 2022-01-23 DIAGNOSIS — Z0000 Encounter for general adult medical examination without abnormal findings: Secondary | ICD-10-CM

## 2022-01-23 DIAGNOSIS — Z125 Encounter for screening for malignant neoplasm of prostate: Secondary | ICD-10-CM

## 2022-01-23 DIAGNOSIS — E118 Type 2 diabetes mellitus with unspecified complications: Secondary | ICD-10-CM

## 2022-01-23 LAB — CBC WITH DIFFERENTIAL/PLATELET
Basophils Absolute: 0.1 10*3/uL (ref 0.0–0.1)
Basophils Relative: 0.4 % (ref 0.0–3.0)
Eosinophils Absolute: 0.2 10*3/uL (ref 0.0–0.7)
Eosinophils Relative: 1.7 % (ref 0.0–5.0)
HCT: 49.1 % (ref 39.0–52.0)
Hemoglobin: 16.7 g/dL (ref 13.0–17.0)
Lymphocytes Relative: 28 % (ref 12.0–46.0)
Lymphs Abs: 3.4 10*3/uL (ref 0.7–4.0)
MCHC: 33.9 g/dL (ref 30.0–36.0)
MCV: 91.3 fl (ref 78.0–100.0)
Monocytes Absolute: 0.7 10*3/uL (ref 0.1–1.0)
Monocytes Relative: 5.7 % (ref 3.0–12.0)
Neutro Abs: 7.8 10*3/uL — ABNORMAL HIGH (ref 1.4–7.7)
Neutrophils Relative %: 64.2 % (ref 43.0–77.0)
Platelets: 322 10*3/uL (ref 150.0–400.0)
RBC: 5.38 Mil/uL (ref 4.22–5.81)
RDW: 14.3 % (ref 11.5–15.5)
WBC: 12.2 10*3/uL — ABNORMAL HIGH (ref 4.0–10.5)

## 2022-01-23 LAB — HEPATIC FUNCTION PANEL
ALT: 20 U/L (ref 0–53)
AST: 14 U/L (ref 0–37)
Albumin: 5.2 g/dL (ref 3.5–5.2)
Alkaline Phosphatase: 52 U/L (ref 39–117)
Bilirubin, Direct: 0.1 mg/dL (ref 0.0–0.3)
Total Bilirubin: 0.6 mg/dL (ref 0.2–1.2)
Total Protein: 8.1 g/dL (ref 6.0–8.3)

## 2022-01-23 LAB — LIPID PANEL
Cholesterol: 88 mg/dL (ref 0–200)
HDL: 45.2 mg/dL (ref >39.00)
LDL Cholesterol: 9 mg/dL (ref 0–99)
NonHDL: 42.41
Total CHOL/HDL Ratio: 2
Triglycerides: 169 mg/dL — ABNORMAL HIGH (ref 0.0–149.0)
VLDL: 33.8 mg/dL (ref 0.0–40.0)

## 2022-01-23 LAB — BASIC METABOLIC PANEL
BUN: 18 mg/dL (ref 6–23)
CO2: 27 mEq/L (ref 19–32)
Calcium: 10.8 mg/dL — ABNORMAL HIGH (ref 8.4–10.5)
Chloride: 100 mEq/L (ref 96–112)
Creatinine, Ser: 1.09 mg/dL (ref 0.40–1.50)
GFR: 75.48 mL/min (ref >60.00)
Glucose, Bld: 132 mg/dL — ABNORMAL HIGH (ref 70–99)
Potassium: 4.4 mEq/L (ref 3.5–5.1)
Sodium: 137 mEq/L (ref 135–145)

## 2022-01-23 LAB — TSH: TSH: 1.92 u[IU]/mL (ref 0.35–5.50)

## 2022-01-23 LAB — PSA: PSA: 0.3 ng/mL (ref 0.10–4.00)

## 2022-01-23 LAB — HM DIABETES EYE EXAM

## 2022-01-23 LAB — HEMOGLOBIN A1C: Hgb A1c MFr Bld: 7.3 % — ABNORMAL HIGH (ref 4.6–6.5)

## 2022-01-23 MED ORDER — TRIAMCINOLONE ACETONIDE 0.025 % EX OINT: 1.0000 "application " | TOPICAL_OINTMENT | Freq: Every day | CUTANEOUS | 3 refills | Status: DC | PRN

## 2022-01-23 NOTE — Progress Notes (Signed)
? ?  Subjective:  ? ? Patient ID: Jason Floyd, male    DOB: Apr 30, 1966, 56 y.o.   MRN: [id number] ? ?HPI ?CPE- UTD on colonoscopy, Tdap, flu, foot exam.  UTD on eye exam- [place] 09/18/21. ? ?Patient Care Team  ?  Relationship Specialty Notifications Start End  ?Midge Minium, MD PCP - General Family Medicine  08/11/12   ?Bensimhon, Shaune Pascal, MD PCP - Advanced Heart Failure Cardiology  06/30/20   ?  ?Health Maintenance  ?Topic Date Due  ? Zoster Vaccines- Shingrix (1 of 2) Never done  ? COVID-19 Vaccine (4 - Booster for [place] series) 10/24/2020  ? OPHTHALMOLOGY EXAM  07/26/2021  ? HEMOGLOBIN A1C  03/15/2022  ? FOOT EXAM  09/15/2022  ? TETANUS/TDAP  09/08/2024  ? COLONOSCOPY (Pts 45-68yrs Insurance coverage will need to be confirmed)  11/28/2025  ? INFLUENZA VACCINE  Completed  ? Hepatitis C Screening  Completed  ? HIV Screening  Completed  ? HPV VACCINES  Aged Out  ?  ? ? ?Review of Systems ?Patient reports no vision/hearing changes, anorexia, fever ,adenopathy, persistant/recurrent hoarseness, swallowing issues, chest pain, palpitations, edema, persistant/recurrent cough, hemoptysis, dyspnea (rest,exertional, paroxysmal nocturnal), gastrointestinal  bleeding (melena, rectal bleeding), abdominal pain, excessive heart burn, GU symptoms (dysuria, hematuria, voiding/incontinence issues) syncope, focal weakness, memory loss, numbness & tingling, skin/hair/nail changes, depression, anxiety, abnormal bruising/bleeding, musculoskeletal symptoms/signs.  ? ?This visit occurred during the SARS-CoV-2 public health emergency.  Safety protocols were in place, including screening questions prior to the visit, additional usage of staff PPE, and extensive cleaning of exam room while observing appropriate contact time as indicated for disinfecting solutions.   ?   ?Objective:  ? Physical Exam ?General Appearance:    Alert, cooperative, no distress, appears stated age  ?Head:    Normocephalic, without obvious abnormality,  atraumatic  ?Eyes:    PERRL, conjunctiva/corneas clear, EOM's intact, fundi  ?  benign, both eyes       ?Ears:    Normal TM's and external ear canals, both ears  ?Nose:   Deferred due to COVID  ?Throat:   ?Neck:   Supple, symmetrical, trachea midline, no adenopathy;     ?  thyroid:  No enlargement/tenderness/nodules  ?Back:     Symmetric, no curvature, ROM normal, no CVA tenderness  ?Lungs:     Clear to auscultation bilaterally, respirations unlabored  ?Chest wall:    No tenderness or deformity  ?Heart:    Regular rate and rhythm, S1 and S2 normal, no murmur, rub ?  or gallop  ?Abdomen:     Soft, non-tender, bowel sounds active all four quadrants,  ?  no masses, no organomegaly  ?Genitalia:    deferred  ?Rectal:    ?Extremities:   Extremities normal, atraumatic, no cyanosis or edema  ?Pulses:   2+ and symmetric all extremities  ?Skin:   Skin color, texture, turgor normal, no rashes or lesions  ?Lymph nodes:   Cervical, supraclavicular, and axillary nodes normal  ?Neurologic:   CNII-XII intact. Normal strength, sensation and reflexes    ?  throughout  ?  ? ? ? ?   ?Assessment & Plan:  ? ? ?

## 2022-01-23 NOTE — Assessment & Plan Note (Signed)
Pt's PE WNL w/ exception of BMI.  UTD on colonoscopy, Tdap, flu, foot exam, eye exam.  Check labs.  Anticipatory guidance provided.  ?

## 2022-01-23 NOTE — Assessment & Plan Note (Signed)
Chronic problem.  UTD on foot exam, eye exam, and on Entresto for renal protection.  Check labs.  Adjust meds prn  ?

## 2022-01-23 NOTE — Patient Instructions (Signed)
Follow up in 6 months to recheck sugar, BP, and cholesterol ?We'll notify you of your lab results and make any changes if needed ?Continue to work on healthy diet and regular exercise- you're doing great! ?Call with any questions or concerns ?Stay Safe!  Stay Healthy! ?Happy Spring!!! ?

## 2022-01-24 NOTE — Progress Notes (Signed)
Contacted patient via mychart

## 2022-01-29 ENCOUNTER — Other Ambulatory Visit: Payer: Self-pay | Admitting: Family Medicine

## 2022-01-29 ENCOUNTER — Other Ambulatory Visit (HOSPITAL_COMMUNITY): Payer: Self-pay | Admitting: Internal Medicine

## 2022-01-29 DIAGNOSIS — E039 Hypothyroidism, unspecified: Secondary | ICD-10-CM

## 2022-02-14 ENCOUNTER — Other Ambulatory Visit: Payer: Self-pay | Admitting: Internal Medicine

## 2022-02-22 ENCOUNTER — Ambulatory Visit (INDEPENDENT_AMBULATORY_CARE_PROVIDER_SITE_OTHER): Payer: BC Managed Care – PPO

## 2022-02-22 DIAGNOSIS — I5022 Chronic systolic (congestive) heart failure: Secondary | ICD-10-CM

## 2022-02-22 DIAGNOSIS — I255 Ischemic cardiomyopathy: Secondary | ICD-10-CM | POA: Diagnosis not present

## 2022-02-22 LAB — CUP PACEART REMOTE DEVICE CHECK
Battery Remaining Longevity: 156 mo
Battery Remaining Percentage: 100 %
Brady Statistic RA Percent Paced: 0 %
Brady Statistic RV Percent Paced: 0 %
Date Time Interrogation Session: 20230413004100
HighPow Impedance: 80 Ohm
Implantable Lead Implant Date: 20211011
Implantable Lead Implant Date: 20211011
Implantable Lead Location: 753859
Implantable Lead Location: 753860
Implantable Lead Model: 138
Implantable Lead Model: 7841
Implantable Lead Serial Number: 1097338
Implantable Lead Serial Number: 303261
Implantable Pulse Generator Implant Date: 20211011
Lead Channel Impedance Value: 635 Ohm
Lead Channel Impedance Value: 684 Ohm
Lead Channel Setting Pacing Amplitude: 2.5 V
Lead Channel Setting Pacing Pulse Width: 0.4 ms
Lead Channel Setting Sensing Sensitivity: 0.5 mV
Pulse Gen Serial Number: 228262

## 2022-02-27 ENCOUNTER — Other Ambulatory Visit: Payer: Self-pay | Admitting: Family Medicine

## 2022-02-27 ENCOUNTER — Other Ambulatory Visit (HOSPITAL_COMMUNITY): Payer: Self-pay | Admitting: Internal Medicine

## 2022-03-12 NOTE — Progress Notes (Signed)
Remote ICD transmission.   

## 2022-03-13 ENCOUNTER — Encounter: Payer: Self-pay | Admitting: Family Medicine

## 2022-03-13 ENCOUNTER — Telehealth (INDEPENDENT_AMBULATORY_CARE_PROVIDER_SITE_OTHER): Payer: BC Managed Care – PPO | Admitting: Family Medicine

## 2022-03-13 DIAGNOSIS — U071 COVID-19: Secondary | ICD-10-CM | POA: Diagnosis not present

## 2022-03-13 MED ORDER — MOLNUPIRAVIR EUA 200MG CAPSULE: 4.0000 | ORAL_CAPSULE | Freq: Two times a day (BID) | ORAL | 0 refills | Status: AC

## 2022-03-13 NOTE — Patient Instructions (Signed)
?HOME CARE TIPS: ? ?-I sent the medication(s) we discussed to your pharmacy: ?Meds ordered this encounter  ?Medications  ? molnupiravir EUA (LAGEVRIO) 200 mg CAPS capsule  ?  Sig: Take 4 capsules (800 mg total) by mouth 2 (two) times daily for 5 days.  ?  Dispense:  40 capsule  ?  Refill:  0  ?  ? ?-I sent in the Covid19 treatment or referral you requested per our discussion. Please see the information provided below and discuss further with the pharmacist/treatment team.  ? ?-there is a chance of rebound illness with covid after improving. This can happen whether or not you take an antiviral treatment. If you become sick again with covid after getting better, please schedule a follow up virtual visit and isolate again. ? ?-nasal saline sinus rinses twice daily ? ?-stay hydrated, drink plenty of fluids and eat small healthy meals - avoid dairy ? ?-follow up with your doctor in 2-3 days unless improving and feeling better ? ?-stay home while sick, except to seek medical care. If you have COVID19, you will likely be contagious for 7-10 days. Flu or Influenza is likely contagious for about 7 days. Other respiratory viral infections remain contagious for 5-10+ days depending on the virus and many other factors. Wear a good mask that fits snugly (such as N95 or KN95) if around others to reduce the risk of transmission. ? ?It was nice to meet you today, and I really hope you are feeling better soon. I help [facility] out with telemedicine visits on Tuesdays and Thursdays and am happy to help if you need a follow up virtual visit on those days. Otherwise, if you have any concerns or questions following this visit please schedule a follow up visit with your Primary Care doctor or seek care at a local urgent care clinic to avoid delays in care.  ? ? ?Seek in person care or schedule a follow up video visit promptly if your symptoms worsen, new concerns arise or you are not improving with treatment. Call 911 and/or seek  emergency care if your symptoms are severe or life threatening. ? ?PLEASE SEE THE FOLLOWING LINK FOR THE MOST UPDATED INFORMATION ABOUT LAGEVRIO: ? ?[email or web address].com/patients/ ? ? ? ?  ?Fact Sheet for Patients And Caregivers ?Emergency Use Authorization (EUA) Of LAGEVRIO? (molnupiravir) capsules For ?Coronavirus Disease 2019 (COVID-19) ? ?What is the most important information I should know about LAGEVRIO? ?LAGEVRIO may cause serious side effects, including: ?? LAGEVRIO may cause harm to your unborn baby. It is not known if LAGEVRIO will ?harm your baby if you take LAGEVRIO during pregnancy. ?o LAGEVRIO is not recommended for use in pregnancy. ?o LAGEVRIO has not been studied in pregnancy. LAGEVRIO was studied in pregnant ?animals only. When LAGEVRIO was given to pregnant animals, LAGEVRIO caused ?harm to their unborn babies. ?o You and your healthcare provider may decide that you should take [place] during ?pregnancy if there are no other COVID-19 treatment options approved or authorized by ?the FDA that are accessible or clinically appropriate for you. ?o If you and your healthcare provider decide that you should take LAGEVRIO during ?pregnancy, you and your healthcare provider should discuss the known and potential ?benefits and the potential risks of taking LAGEVRIO during pregnancy. ?For individuals who are able to become pregnant: ?? You should use a reliable method of birth control (contraception) consistently and correctly ?during treatment with LAGEVRIO and for 4 days after the last dose of LAGEVRIO. Talk to ?your healthcare provider about  reliable birth control methods. ?? Before starting treatment with [facility] your healthcare provider may do a pregnancy test ?to see if you are pregnant before starting treatment with LAGEVRIO. ?? Tell your healthcare provider right away if you become pregnant or think you may be ?pregnant during treatment with LAGEVRIO. ?Pregnancy Surveillance Program: ?? There is a  pregnancy surveillance program for individuals who take LAGEVRIO during ?pregnancy. The purpose of this program is to collect information about the health of you and ?your baby. Talk to your healthcare provider about how to take part in this program. ?? If you take LAGEVRIO during pregnancy and you agree to participate in the pregnancy ?surveillance program and allow your healthcare provider to share your information with ?[place], then your healthcare provider will report your use of [place] ?during pregnancy to [employer]. by calling [phone] or ?[employer]aceful[employer].es. ?For individuals who are sexually active with partners who are able to become pregnant: ?? It is not known if LAGEVRIO can affect sperm. While the risk is regarded as low, animal ?studies to fully assess the potential for LAGEVRIO to affect the babies of males treated with ?LAGEVRIO have not been completed. A reliable method of birth control (contraception) ?should be used consistently and correctly during treatment with LAGEVRIO and for at least ?3 months after the last dose. The risk to sperm beyond 3 months is not known. Studies to ?understand the risk to sperm beyond 3 months are ongoing. Talk to your healthcare provider ?about reliable birth control methods. Talk to your healthcare provider if you have questions ?or concerns about how LAGEVRIO may affect sperm. ?You are being given this fact sheet because your healthcare provider believes it is necessary to ?provide you with LAGEVRIO for the treatment of adults with mild-to-moderate coronavirus ?disease 2019 (COVID-19) with positive results of direct SARS-CoV-2 viral testing, and ?who are at high risk for progression to severe COVID-19 including hospitalization or death, and ?for whom other COVID-19 treatment options approved or authorized by the FDA are not ?accessible or clinically appropriate. ?The U.S. Food and Drug Administration (FDA) has issued an  Emergency Use Authorization ?(EUA) to make LAGEVRIO available during the COVID-19 pandemic (for more details about an ?EUA please see ?What is an Emergency Use Authorization?? at the end of this document). ?LAGEVRIO is not an FDA-approved medicine in the [place]. Read this Fact Sheet for ?information about LAGEVRIO. Talk to your healthcare provider about your options if you have ?any questions. It is your choice to take LAGEVRIO. ? ?What is COVID-19? ?COVID-19 is caused by a virus called a coronavirus. You can get COVID-19 through close ?contact with another person who has the virus. ?COVID-19 illnesses have ranged from very mild-to-severe, including illness resulting in death. ?While information so far suggests that most COVID-19 illness is mild, serious illness can ?happen and may cause some of your other medical conditions to become worse. Older people ?and people of all ages with severe, long lasting (chronic) medical conditions like heart disease, ?lung disease and diabetes, for example seem to be at higher risk of being hospitalized for ?COVID-19. ? ?What is LAGEVRIO? ?LAGEVRIO is an investigational medicine used to treat mild-to-moderate COVID-19 in adults: ?? with positive results of direct SARS-CoV-2 viral testing, and ?? who are at high risk for progression to severe COVID-19 including hospitalization or death, ?and for whom other COVID-19 treatment options approved or authorized by the FDA are ?not accessible or clinically appropriate. ?The FDA has authorized the  emergency use of LAGEVRIO for the treatment of mild-tomoderate COVID-19 in adults under an EUA. For more information on EUA, see the ?What is ?an Emergency Use Authorization (EUA)?? section at the end of this Fact Sheet. ?LAGEVRIO is not authorized: ?? for use in people less than 19 years of age. ?? for prevention of COVID-19. ?? for people needing hospitalization for COVID-19. ?? for use for longer than 5 consecutive days. ? ?What should I  tell my healthcare provider before I take LAGEVRIO? ?Tell your healthcare provider if you: ?? Have any allergies ?? Are breastfeeding or plan to breastfeed ?? Have any serious illnesses ?? Are taking any medici

## 2022-03-13 NOTE — Progress Notes (Signed)
Virtual Visit via Video Note ? ?I connected with Campbell ? on 03/13/22 at  4:20 PM EDT by a video enabled telemedicine application and verified that I am speaking with the correct person using two identifiers. ? Location patient: [place] ?Location provider:work or home office ?Persons participating in the virtual visit: patient, provider ? ?I discussed the limitations and requested verbal permission for telemedicine visit. The patient expressed understanding and agreed to proceed. ? ? ?HPI: ? ?Acute telemedicine visit for Covid19: ?-Onset: yesterday, he tested today because he was exposed over the weekend - positive test ?-Symptoms include:feels a little achy, mild nasal congestion - reports symptoms no bad but he tested due to exposure, wants to take antiviral ?-Denies: CP, SOB, NVD, inability to eat, drink, get out of bed  ?-Has tried: none ?-Pertinent past medical history: see below, has had covid once in the past - was a mild case ?-Pertinent medication allergies:No Known Allergies ?-COVID-19 vaccine status: reports has had 2 doses and 2 boosters ?Immunization History  ?Administered Date(s) Administered  ? Influenza,inj,Quad PF,6+ Mos 08/05/2019, 08/29/2020, 07/31/2021  ? PFIZER(Purple Top)SARS-COV-2 Vaccination 02/03/2020, 02/24/2020, 08/29/2020  ? Pneumococcal Polysaccharide-23 04/26/2021  ? Tdap 09/08/2014  ? ? ?ROS: See pertinent positives and negatives per HPI. ? ?Past Medical History:  ?Diagnosis Date  ? AICD (automatic cardioverter/defibrillator) present   ? Allergy   ? Arthritis   ? CHF (congestive heart failure) ([place])   ? CHF (congestive heart failure) ([place])   ? Diabetes mellitus without complication ([place])   ? Drug abuse ([place])   ? Eczema   ? GERD (gastroesophageal reflux disease)   ? Gout   ? Heart attack ([place]) 06/10/2020  ? History of kidney stones   ? Hyperlipidemia   ? MI (myocardial infarction) ([place])   ? PE (pulmonary embolism)   ? TIA (transient ischemic attack)   ? hx of per pt - pt not aware he had not  followed by a neurologist  ? ? ?Past Surgical History:  ?Procedure Laterality Date  ? CERVICAL FUSION    ? ICD IMPLANT N/A 08/22/2020  ? Procedure: ICD IMPLANT;  Surgeon: Deboraha Sprang, MD;  Location: [facility];  Service: Cardiovascular;  Laterality: N/A;  ? KNEE ARTHROSCOPY Right 10/28/2020  ? Procedure: RIGHT KNEE ARTHROSCOPY, PARTIAL MEDIAL MENISCECTOMY, CHONDROPLASTY;  Surgeon: Melrose Nakayama, MD;  Location: [facility];  Service: Orthopedics;  Laterality: Right;  ? left thumb surgery     ? RIGHT/LEFT HEART CATH AND CORONARY ANGIOGRAPHY N/A 07/01/2020  ? Procedure: RIGHT/LEFT HEART CATH AND CORONARY ANGIOGRAPHY;  Surgeon: Jolaine Artist, MD;  Location: [facility];  Service: Cardiovascular;  Laterality: N/A;  ? SHOULDER ARTHROSCOPY Left   ? TOTAL KNEE ARTHROPLASTY Right 10/03/2021  ? Procedure: RIGHT TOTAL KNEE ARTHROPLASTY;  Surgeon: Melrose Nakayama, MD;  Location: [facility];  Service: Orthopedics;  Laterality: Right;  ? VASECTOMY Bilateral   ? WISDOM TOOTH EXTRACTION    ? ? ? ?Current Outpatient Medications:  ?  Alirocumab (PRALUENT) 150 MG/ML SOAJ, INJECT 150MG  INTO THE SKIN EVERY 14 DAYS, Disp: 6 mL, Rfl: 3 ?  allopurinol (ZYLOPRIM) 300 MG tablet, Take 2 tablets (600 mg total) by mouth daily., Disp: 60 tablet, Rfl: 6 ?  amiodarone (PACERONE) 200 MG tablet, TAKE 1/2 TABLET (100 MG TOTAL) BY MOUTH DAILY., Disp: 45 tablet, Rfl: 3 ?  aspirin EC 81 MG tablet, Take 81 mg by mouth daily. Swallow whole., Disp: , Rfl:  ?  atorvastatin (LIPITOR) 80 MG tablet, TAKE  1 TABLET BY MOUTH DAILY., Disp: 30 tablet, Rfl: 11 ?  calcium carbonate (TUMS - DOSED IN MG ELEMENTAL CALCIUM) 500 MG chewable tablet, Chew 2-3 tablets by mouth daily as needed for indigestion or heartburn., Disp: , Rfl:  ?  carvedilol (COREG) 12.5 MG tablet, TAKE 1 AND 1/2 TABLETS BY MOUTH 2 TIMES DAILY WITH A MEAL., Disp: 270 tablet, Rfl: 2 ?  cetirizine (ZYRTEC) 10 MG tablet, Take 10 mg by mouth daily., Disp: , Rfl:  ?  ELIQUIS 5 MG TABS  tablet, TAKE 1 TABLET BY MOUTH 2 TIMES A DAY, Disp: 60 tablet, Rfl: 11 ?  ENTRESTO 49-51 MG, TAKE 1 TABLET BY MOUTH 2 TIMES DAILY., Disp: 60 tablet, Rfl: 11 ?  FARXIGA 10 MG TABS tablet, TAKE 1 TABLET BY MOUTH DAILY BEFORE BREAKFAST., Disp: 30 tablet, Rfl: 0 ?  fenofibrate 160 MG tablet, TAKE 1 TABLET BY MOUTH DAILY., Disp: 90 tablet, Rfl: 1 ?  icosapent Ethyl (VASCEPA) 1 g capsule, TAKE 2 CAPSULES BY MOUTH 2 TIMES DAILY., Disp: 360 capsule, Rfl: 3 ?  levothyroxine (SYNTHROID) 50 MCG tablet, TAKE 1 TABLET (50 MCG TOTAL) BY MOUTH DAILY 30 MINUTES BEFORE BREAKFAST AND ALL OTHER PILLS., Disp: 90 tablet, Rfl: 3 ?  metFORMIN (GLUCOPHAGE) 1000 MG tablet, TAKE 1 TABLET BY MOUTH 2 TIMES DAILY WITH A MEAL., Disp: 60 tablet, Rfl: 0 ?  methocarbamol (ROBAXIN) 500 MG tablet, TAKE 1 TABLET BY MOUTH EVERY 6 HOURS AS NEEDED FOR MUSCLE SPASMS. (Patient taking differently: at bedtime.), Disp: 90 tablet, Rfl: 1 ?  molnupiravir EUA (LAGEVRIO) 200 mg CAPS capsule, Take 4 capsules (800 mg total) by mouth 2 (two) times daily for 5 days., Disp: 40 capsule, Rfl: 0 ?  Naphazoline HCl (CLEAR EYES OP), Place 1 drop into both eyes daily as needed (sore eyes)., Disp: , Rfl:  ?  pantoprazole (PROTONIX) 40 MG tablet, TAKE 1 TABLET BY MOUTH DAILY., Disp: 30 tablet, Rfl: 2 ?  sodium chloride (OCEAN) 0.65 % SOLN nasal spray, Place 1 spray into both nostrils as needed (dryness)., Disp: , Rfl:  ?  spironolactone (ALDACTONE) 25 MG tablet, TAKE 1 TABLET (25 MG TOTAL) BY MOUTH DAILY., Disp: 30 tablet, Rfl: 11 ?  traZODone (DESYREL) 100 MG tablet, Take 1 tablet (100 mg total) by mouth at bedtime., Disp: 90 tablet, Rfl: 1 ?  triamcinolone (KENALOG) 0.025 % ointment, Apply 1 application. topically daily as needed (irritation)., Disp: 80 g, Rfl: 3 ? ?EXAM: ? ?VITALS per patient if applicable: ? ?GENERAL: alert, oriented, appears well and in no acute distress ? ?HEENT: atraumatic, conjunttiva clear, no obvious abnormalities on inspection of external nose and  ears ? ?NECK: normal movements of the head and neck ? ?LUNGS: on inspection no signs of respiratory distress, breathing rate appears normal, no obvious gross SOB, gasping or wheezing ? ?CV: no obvious cyanosis ? ?MS: moves all visible extremities without noticeable abnormality ? ?PSYCH/NEURO: pleasant and cooperative, no obvious depression or anxiety, speech and thought processing grossly intact ? ?ASSESSMENT AND PLAN: ? ?Discussed the following assessment and plan: ? ?COVID-19 ? ? ? Discussed treatment options, side effect and risk of drug interactions, ideal treatment window, potential complications, isolation and precautions for COVID-19.  Discussed possibility of rebound with or without antivirals. Checked for/reviewed last GFR - listed in HPI if available. After lengthy discussion, the patient opted for treatment with Legevrio due to being higher risk for complications of covid or severe disease and other factors. Discussed EUA status of this drug and the  fact that there is preliminary limited knowledge of risks/interactions/side effects per EUA document vs possible benefits and precautions. This information was shared with patient during the visit and also was provided in patient instructions.  Other symptomatic care measures summarized in patient instructions. ?Work/School slipped offered: provided in patient instructions   ?Advised to seek prompt virtual visit or in person care if worsening, new symptoms arise, or if is not improving with treatment as expected per our conversation of expected course. Discussed options for follow up care. ?  ?I discussed the assessment and treatment plan with the patient. The patient was provided an opportunity to ask questions and all were answered. The patient agreed with the plan and demonstrated an understanding of the instructions. ?  ? ? ?Lucretia Kern, DO  ? ?

## 2022-03-16 ENCOUNTER — Encounter: Payer: Self-pay | Admitting: Family Medicine

## 2022-03-28 ENCOUNTER — Encounter: Payer: Self-pay | Admitting: Internal Medicine

## 2022-03-29 ENCOUNTER — Other Ambulatory Visit: Payer: Self-pay | Admitting: Family Medicine

## 2022-03-30 ENCOUNTER — Other Ambulatory Visit (HOSPITAL_COMMUNITY): Payer: Self-pay | Admitting: Orthopedic Surgery

## 2022-03-30 ENCOUNTER — Other Ambulatory Visit: Payer: Self-pay | Admitting: Orthopedic Surgery

## 2022-03-30 DIAGNOSIS — M25511 Pain in right shoulder: Secondary | ICD-10-CM

## 2022-04-17 ENCOUNTER — Encounter (HOSPITAL_COMMUNITY): Payer: Self-pay | Admitting: Internal Medicine

## 2022-04-19 ENCOUNTER — Other Ambulatory Visit: Payer: Self-pay | Admitting: Family Medicine

## 2022-04-23 ENCOUNTER — Other Ambulatory Visit: Payer: Self-pay | Admitting: Family Medicine

## 2022-05-10 ENCOUNTER — Encounter (HOSPITAL_COMMUNITY): Payer: Self-pay | Admitting: Internal Medicine

## 2022-05-10 ENCOUNTER — Ambulatory Visit (HOSPITAL_COMMUNITY)
Admission: RE | Admit: 2022-05-10 | Discharge: 2022-05-10 | Disposition: A | Discharge status: Home / Self Care | Payer: BC Managed Care – PPO | Source: Ambulatory Visit | Attending: Family Medicine | Admitting: Family Medicine

## 2022-05-10 ENCOUNTER — Ambulatory Visit (HOSPITAL_BASED_OUTPATIENT_CLINIC_OR_DEPARTMENT_OTHER)
Admission: RE | Admit: 2022-05-10 | Discharge: 2022-05-10 | Disposition: A | Discharge status: Home / Self Care | Payer: BC Managed Care – PPO | Source: Ambulatory Visit | Attending: Internal Medicine | Admitting: Internal Medicine

## 2022-05-10 VITALS — BP 108/76 | HR 63 | Wt 236.8 lb

## 2022-05-10 DIAGNOSIS — I2510 Atherosclerotic heart disease of native coronary artery without angina pectoris: Secondary | ICD-10-CM

## 2022-05-10 DIAGNOSIS — I5022 Chronic systolic (congestive) heart failure: Secondary | ICD-10-CM | POA: Insufficient documentation

## 2022-05-10 DIAGNOSIS — F17210 Nicotine dependence, cigarettes, uncomplicated: Secondary | ICD-10-CM | POA: Insufficient documentation

## 2022-05-10 DIAGNOSIS — Z79890 Hormone replacement therapy (postmenopausal): Secondary | ICD-10-CM | POA: Insufficient documentation

## 2022-05-10 DIAGNOSIS — Z79899 Other long term (current) drug therapy: Secondary | ICD-10-CM | POA: Insufficient documentation

## 2022-05-10 DIAGNOSIS — Z7982 Long term (current) use of aspirin: Secondary | ICD-10-CM | POA: Insufficient documentation

## 2022-05-10 DIAGNOSIS — I4819 Other persistent atrial fibrillation: Secondary | ICD-10-CM | POA: Diagnosis not present

## 2022-05-10 DIAGNOSIS — E039 Hypothyroidism, unspecified: Secondary | ICD-10-CM | POA: Insufficient documentation

## 2022-05-10 DIAGNOSIS — I472 Ventricular tachycardia, unspecified: Secondary | ICD-10-CM | POA: Diagnosis not present

## 2022-05-10 DIAGNOSIS — Z95810 Presence of automatic (implantable) cardiac defibrillator: Secondary | ICD-10-CM | POA: Diagnosis not present

## 2022-05-10 DIAGNOSIS — E782 Mixed hyperlipidemia: Secondary | ICD-10-CM

## 2022-05-10 DIAGNOSIS — I255 Ischemic cardiomyopathy: Secondary | ICD-10-CM | POA: Diagnosis not present

## 2022-05-10 DIAGNOSIS — E119 Type 2 diabetes mellitus without complications: Secondary | ICD-10-CM | POA: Insufficient documentation

## 2022-05-10 DIAGNOSIS — I5042 Chronic combined systolic (congestive) and diastolic (congestive) heart failure: Secondary | ICD-10-CM | POA: Diagnosis not present

## 2022-05-10 DIAGNOSIS — Z7901 Long term (current) use of anticoagulants: Secondary | ICD-10-CM | POA: Diagnosis not present

## 2022-05-10 LAB — BASIC METABOLIC PANEL
Anion gap: 9 (ref 5–15)
BUN: 16 mg/dL (ref 6–20)
CO2: 23 mmol/L (ref 22–32)
Calcium: 9.4 mg/dL (ref 8.9–10.3)
Chloride: 106 mmol/L (ref 98–111)
Creatinine, Ser: 1.15 mg/dL (ref 0.61–1.24)
GFR, Estimated: >60 mL/min (ref >60)
Glucose, Bld: 182 mg/dL — ABNORMAL HIGH (ref 70–99)
Potassium: 4.6 mmol/L (ref 3.5–5.1)
Sodium: 138 mmol/L (ref 135–145)

## 2022-05-10 LAB — BRAIN NATRIURETIC PEPTIDE: B Natriuretic Peptide: 98.2 pg/mL (ref 0.0–100.0)

## 2022-05-10 LAB — ECHOCARDIOGRAM COMPLETE
AR max vel: 3.59 cm2
AV Area VTI: 3.56 cm2
AV Area mean vel: 3.5 cm2
AV Mean grad: 4 mmHg
AV Peak grad: 7.1 mmHg
Ao pk vel: 1.33 m/s
Area-P 1/2: 3.31 cm2
Calc EF: 47.4 %
S' Lateral: 4.1 cm
Single Plane A2C EF: 46.5 %
Single Plane A4C EF: 47.3 %

## 2022-05-10 NOTE — Patient Instructions (Signed)
There has been no changes to your medications.  Labs done today, your results will be available in MyChart, we will contact you for abnormal readings.  Your physician recommends that you schedule a follow-up appointment in: 6 months ( December 2023)  **please call the office in September to arrange your follow up appointment. **  If you have any questions or concerns before your next appointment please send us a message through mychart or call our office at [phone].    TO LEAVE A MESSAGE FOR THE NURSE SELECT OPTION 2, PLEASE LEAVE A MESSAGE INCLUDING: YOUR NAME DATE OF BIRTH CALL BACK NUMBER REASON FOR CALL**this is important as we prioritize the call backs  YOU WILL RECEIVE A CALL BACK THE SAME DAY AS LONG AS YOU CALL BEFORE 4:00 PM  At the [facility], you and your health needs are our priority. As part of our continuing mission to provide you with exceptional heart care, we have created designated Provider Care Teams. These Care Teams include your primary Cardiologist (physician) and Advanced Practice Providers (APPs- Physician Assistants and Nurse Practitioners) who all work together to provide you with the care you need, when you need it.   You may see any of the following providers on your designated Care Team at your next follow up: Dr Daniel Bensimhon Dr Dalton McLean Amy Clegg, NP Brittainy Simmons, PA Jessica Milford,NP Lindsay Finch, PA Lauren Kemp, PharmD   Please be sure to bring in all your medications bottles to every appointment.    

## 2022-05-10 NOTE — Progress Notes (Signed)
Error

## 2022-05-10 NOTE — Progress Notes (Signed)
  Echocardiogram 2D Echocardiogram has been performed.  Roosvelt Maser F 05/10/2022, 11:46 AM

## 2022-05-10 NOTE — Progress Notes (Signed)
AD[facility]ED [facility] CLINIC NOTE PCP: Dr. Neena Rhymes HF: Dr. [name] EP: Dr. Graciela Husbands   HPI:  Jason Floyd is 57 year old with a PMH of severe systolic heart failure due to NICM, DM2, former polysubstance and tobacco abuse.    Echo 2008 EF 15% in setting of polysubstance abuse.   ECHO 12/2007 EF 55-60%  Echo 10/20 EF 55-60% no RWMA.   On 06/07/20 had TIA-like symptoms with mild aphasia and weakness (dropped a can with left hand). Went to PCP had carotid u/s and echo. Carotid u/s 06/22/20 1-39% bilaterally. Echo 8/21 EF down to 25-30%. Brain MRI/MRA. Normal vasculature.  Left corona radiata diffusion-weighted/FLAIR hyperintensity is nonspecific. Differential includes active demyelinating lesionversus subacute insult.  Underwent R/L cath 07/01/20 LAD 100% prox LCX 20% RCA 10%  EF 25-30% Ao = 112/72 (90) LV = 111/27 RA = 2 RV = 46/7 PA = 44/12 (27) PCW = 16 Fick cardiac output/index = 6.8/2.9 PVR = 1.5 WU SVR 1047 Ao sat = 96% PA sat = 70%, 78% SVC sat = 74%   After cath underwent cMRI.for viability  - LVEF 26% - LGE suggestive of a very large, wrap-around LAD infarction with extensive no-reflow affecting the subendocardium.   Zio monitored placed for palpitations in 10/21 and found to have VT. Underwent ICD placement by Dr. Graciela Husbands on 08/22/20. Was also found to have volume overload and AF.   Here for routine f/u. Active. Feels great. No CP or SOB. No edema, orthopnea or PND. No ICD firing. No bleeding with Eliquis.    ICD interrogated HL 5, No VT/VF. Volume minimally elevated. Activity level average 1.6 hr/Day but many days > 6 hours Personally reviewed    Past Medical History:  Diagnosis Date   AICD (automatic cardioverter/defibrillator) present    Allergy    Arthritis    CHF (congestive heart failure) ([facility])    CHF (congestive heart failure) ([facility])    Diabetes mellitus without complication ([facility])    Drug abuse ([facility])    Eczema    GERD (gastroesophageal reflux disease)     Gout    Heart attack ([facility]) 06/10/2020   History of kidney stones    Hyperlipidemia    MI (myocardial infarction) ([facility])    PE (pulmonary embolism)    TIA (transient ischemic attack)    hx of per pt - pt not aware he had not followed by a neurologist    Current Outpatient Medications  Medication Sig Dispense Refill   Alirocumab (PRALUENT) 150 MG/ML SOAJ INJECT 150MG  INTO THE SKIN EVERY 14 DAYS 6 mL 3   allopurinol (ZYLOPRIM) 300 MG tablet Take 2 tablets (600 mg total) by mouth daily. 60 tablet 6   amiodarone (PACERONE) 200 MG tablet TAKE 1/2 TABLET (100 MG TOTAL) BY MOUTH DAILY. 45 tablet 3   aspirin EC 81 MG tablet Take 81 mg by mouth daily. Swallow whole.     atorvastatin (LIPITOR) 80 MG tablet TAKE 1 TABLET BY MOUTH DAILY. 30 tablet 11   calcium carbonate (TUMS - DOSED IN MG ELEMENTAL CALCIUM) 500 MG chewable tablet Chew 2-3 tablets by mouth daily as needed for indigestion or heartburn.     carvedilol (COREG) 12.5 MG tablet TAKE 1 AND 1/2 TABLETS BY MOUTH 2 TIMES DAILY WITH A MEAL. 270 tablet 2   cetirizine (ZYRTEC) 10 MG tablet Take 10 mg by mouth daily.     ELIQUIS 5 MG TABS tablet TAKE 1 TABLET BY MOUTH 2 TIMES A DAY 60 tablet 11  ENTRESTO 49-51 MG TAKE 1 TABLET BY MOUTH 2 TIMES DAILY. 60 tablet 11   FARXIGA 10 MG TABS tablet TAKE 1 TABLET BY MOUTH DAILY BEFORE BREAKFAST. 30 tablet 0   fenofibrate 160 MG tablet TAKE 1 TABLET BY MOUTH DAILY. 90 tablet 1   icosapent Ethyl (VASCEPA) 1 g capsule TAKE 2 CAPSULES BY MOUTH 2 TIMES DAILY. 360 capsule 3   levothyroxine (SYNTHROID) 50 MCG tablet TAKE 1 TABLET (50 MCG TOTAL) BY MOUTH DAILY 30 MINUTES BEFORE BREAKFAST AND ALL OTHER PILLS. 90 tablet 3   metFORMIN (GLUCOPHAGE) 1000 MG tablet TAKE 1 TABLET BY MOUTH 2 TIMES DAILY WITH A MEAL. 60 tablet 0   methocarbamol (ROBAXIN) 500 MG tablet TAKE 1 TABLET BY MOUTH EVERY 6 HOURS AS NEEDED FOR MUSCLE SPASMS. 90 tablet 1   methocarbamol (ROBAXIN) 500 MG tablet Take 500 mg by mouth at bedtime.      Naphazoline HCl (CLEAR EYES OP) Place 1 drop into both eyes daily as needed (sore eyes).     pantoprazole (PROTONIX) 40 MG tablet TAKE 1 TABLET BY MOUTH DAILY. 30 tablet 2   sodium chloride (OCEAN) 0.65 % SOLN nasal spray Place 1 spray into both nostrils as needed (dryness).     spironolactone (ALDACTONE) 25 MG tablet TAKE 1 TABLET (25 MG TOTAL) BY MOUTH DAILY. 30 tablet 11   traZODone (DESYREL) 100 MG tablet Take 1 tablet (100 mg total) by mouth at bedtime. 90 tablet 1   triamcinolone (KENALOG) 0.025 % ointment Apply 1 application. topically daily as needed (irritation). 80 g 3   No current facility-administered medications for this encounter.    No Known Allergies    Social History   Socioeconomic History   Marital status: Married    Spouse name: Jason Floyd   Number of children: Not on file   Years of education: Not on file   Highest education level: Bachelor's degree (e.g., BA, AB, BS)  Occupational History   Not on file  Tobacco Use   Smoking status: Some Days    Packs/day: 0.25    Years: 31.00    Total pack years: 7.75    Types: Cigarettes   Smokeless tobacco: Never   Tobacco comments:    Stopped a few weeks ago  Vaping Use   Vaping Use: Never used  Substance and Sexual Activity   Alcohol use: Yes    Comment: occas   Drug use: Not Currently    Types: Marijuana    Comment: prefers not to comment   Sexual activity: Not on file  Other Topics Concern   Not on file  Social History Narrative   Lives with wife   Caffeine- coffee 4 c daily   Social Determinants of Health   Financial Resource Strain: Not on file  Food Insecurity: Not on file  Transportation Needs: Not on file  Physical Activity: Not on file  Stress: Not on file  Social Connections: Not on file  Intimate Partner Violence: Not on file      Family History  Problem Relation Age of Onset   Dementia Mother        frontal-temporal   Stroke Mother    Breast cancer Maternal Aunt    Other Brother         overdose   Colon cancer Neg Hx     Vitals:   05/10/22 1159  BP: 108/76  Pulse: 63  SpO2: 95%  Weight: 107.4 kg (236 lb 12.8 oz)    PHYSICAL EXAM: General:  Well appearing. No resp difficulty HEENT: normal Neck: supple. no JVD. Carotids 2+ bilat; no bruits. No lymphadenopathy or thryomegaly appreciated. Cor: PMI nondisplaced. Regular rate & rhythm. No rubs, gallops or murmurs. Lungs: clear Abdomen: soft, nontender, nondistended. No hepatosplenomegaly. No bruits or masses. Good bowel sounds. Extremities: no cyanosis, clubbing, rash, edema Neuro: alert & orientedx3, cranial nerves grossly intact. moves all 4 extremities w/o difficulty. Affect pleasant   ASSESSMENT & PLAN:  Chronic Systolic HF due to iCM - Echo 2008 EF 15% in setting of polysubstance abuse.  - ECHO 12/2007 EF 55-60% - Echo 10/20 EF 55-60% no RWMA.  - Echo 8/21 EF 25-30% - Cath 8/21 LAD 100% LCx 20% RCA 10% - cMRI 08/02/20 EF 26% Large anterior infarct - ICD placed 10/21 for VT - Echo 10/17/20 EF 30-35% - Echo 6/22 EF 40-45%  - Echo today 05/10/22 EF 40-45% (read as 35-40%)  - Doing very well. NYHA I-II - Continue carvedilol 18.75 bid - Continue Entresto 49/51 bid (previously cut back due to low BP) - Continue Farxiga 10 mg daily. - Continue spiro 25 daily - Recent labs ok  - ICD interrogated HL 5, No VT/VF. Volume minimally elevated. Activity level average 1.6 hr/Day but many days > 6 hours Personally reviewed  2. CAD - cath as above with subacute LAD infarct and CTO LAD - no s/s angina  - continue ASA, statin.  - Recent LDL was 9. Followed by Lipid clinic  3. VT - s/p ICD 10/21 - quiescent on amio Last visit decreased to 100mg  daily.  He is followed by Dr. Caryl Comes last seen 03/2021 plan is to continue amio for 9 months and if no VT or afib he will discontinue. - Keep K. 4.0 Mg > 2.0  4. Persistent AF - In NSR here. No AF on ICD - last visit decreased amio to 100 daily.  He is followed by Dr. Caryl Comes  last seen 03/2021 plan is to continue amio for 9 months and if no VT or afib he will discontinue. - Continue Eliquis 5 mg bid. No bleeding - No change  5. Tobacco use -  still working on quitting.Smoking just an occasional cigarette - discussed cessation   6. DM2 - continue SGLT2i, metformin  - followed by PCP. Have suggested starting GLP1-RA  7. Hx Possible TIA - carotid dopplers ok - Brain MRI/MRA. Vasculature ok. Findings suspicious for cardio-embolism.   8. Hypothyroidism - likely due to amio - PCP managing synthroid   Glori Bickers, MD  12:38 PM

## 2022-05-17 ENCOUNTER — Encounter: Payer: Self-pay | Admitting: Pharmacist

## 2022-05-17 ENCOUNTER — Telehealth: Payer: Self-pay | Admitting: Pharmacist

## 2022-05-17 ENCOUNTER — Other Ambulatory Visit: Payer: Self-pay | Admitting: Family Medicine

## 2022-05-17 ENCOUNTER — Other Ambulatory Visit: Payer: Self-pay

## 2022-05-17 MED ORDER — REPATHA SURECLICK 140 MG/ML ~~LOC~~ SOAJ: 1.0000 | SUBCUTANEOUS | 3 refills | Status: DC

## 2022-05-17 MED ORDER — METHOCARBAMOL 500 MG PO TABS
500.0000 mg | ORAL_TABLET | Freq: Four times a day (QID) | ORAL | 0 refills | Status: DC | PRN
Start: 2022-05-17 — End: 2022-06-12

## 2022-05-17 NOTE — Telephone Encounter (Signed)
Pt's formulary changed from Praluent to Repatha. Repatha available on his plan without prior authorization.           Copay card:       Jason Floyd: [id number]       PCN: [id number]       GRP: [id number]      ID: [id number]             Spoke with pt to make him aware of med change. New rx sent to pharmacy and copay card info sent to pt in MyChart message. He was appreciative for the call.

## 2022-05-18 ENCOUNTER — Other Ambulatory Visit: Payer: Self-pay | Admitting: Family Medicine

## 2022-05-18 NOTE — Telephone Encounter (Signed)
Patient is requesting a refill of the following medications: Requested Prescriptions   Pending Prescriptions Disp Refills   metFORMIN (GLUCOPHAGE) 1000 MG tablet [Pharmacy Med Name: METFORMIN HCL 1000 MG TAB 1000 Tablet] 60 tablet 0    Sig: TAKE 1 TABLET BY MOUTH 2 TIMES DAILY WITH A MEAL.   FARXIGA 10 MG TABS tablet [Pharmacy Med Name: FARXIGA 10 MG TABLET 10 Tablet] 30 tablet 0    Sig: TAKE 1 TABLET BY MOUTH DAILY BEFORE BREAKFAST.   traZODone (DESYREL) 100 MG tablet [Pharmacy Med Name: TRAZODONE HCL 100 MG TABS 100 Tablet] 90 tablet 1    Sig: TAKE 1 TABLET BY MOUTH AT BEDTIME.    Date of patient request: 05/18/22 Last office visit: 01/23/22 Date of last refill: 11/15/21 Last refill amount: 90

## 2022-05-24 ENCOUNTER — Ambulatory Visit (INDEPENDENT_AMBULATORY_CARE_PROVIDER_SITE_OTHER): Payer: BC Managed Care – PPO

## 2022-05-24 DIAGNOSIS — I255 Ischemic cardiomyopathy: Secondary | ICD-10-CM

## 2022-05-24 LAB — CUP PACEART REMOTE DEVICE CHECK
Battery Remaining Longevity: 156 mo
Battery Remaining Percentage: 100 %
Brady Statistic RA Percent Paced: 0 %
Brady Statistic RV Percent Paced: 0 %
Date Time Interrogation Session: 20230713004100
HighPow Impedance: 80 Ohm
Implantable Lead Implant Date: 20211011
Implantable Lead Implant Date: 20211011
Implantable Lead Location: 753859
Implantable Lead Location: 753860
Implantable Lead Model: 138
Implantable Lead Model: 7841
Implantable Lead Serial Number: 1097338
Implantable Lead Serial Number: 303261
Implantable Pulse Generator Implant Date: 20211011
Lead Channel Impedance Value: 622 Ohm
Lead Channel Impedance Value: 633 Ohm
Lead Channel Setting Pacing Amplitude: 2.5 V
Lead Channel Setting Pacing Pulse Width: 0.4 ms
Lead Channel Setting Sensing Sensitivity: 0.5 mV
Pulse Gen Serial Number: 228262

## 2022-05-28 ENCOUNTER — Other Ambulatory Visit: Payer: Self-pay | Admitting: Internal Medicine

## 2022-05-29 ENCOUNTER — Encounter (HOSPITAL_COMMUNITY): Payer: Self-pay

## 2022-05-29 ENCOUNTER — Ambulatory Visit (HOSPITAL_COMMUNITY): Admission: RE | Admit: 2022-05-29 | Payer: BC Managed Care – PPO | Source: Ambulatory Visit

## 2022-05-29 ENCOUNTER — Ambulatory Visit (HOSPITAL_COMMUNITY)
Admission: RE | Admit: 2022-05-29 | Discharge: 2022-05-29 | Disposition: A | Discharge status: Home / Self Care | Payer: BC Managed Care – PPO | Source: Ambulatory Visit | Attending: Orthopedic Surgery | Admitting: Orthopedic Surgery

## 2022-05-29 DIAGNOSIS — M25511 Pain in right shoulder: Secondary | ICD-10-CM | POA: Insufficient documentation

## 2022-06-01 ENCOUNTER — Telehealth (INDEPENDENT_AMBULATORY_CARE_PROVIDER_SITE_OTHER): Payer: BC Managed Care – PPO | Admitting: Family Medicine

## 2022-06-01 ENCOUNTER — Encounter: Payer: Self-pay | Admitting: Family Medicine

## 2022-06-01 DIAGNOSIS — M545 Low back pain, unspecified: Secondary | ICD-10-CM

## 2022-06-01 MED ORDER — PREDNISONE 10 MG PO TABS: ORAL_TABLET | ORAL | 0 refills | Status: DC

## 2022-06-01 MED ORDER — CYCLOBENZAPRINE HCL 10 MG PO TABS: 10.0000 mg | ORAL_TABLET | Freq: Three times a day (TID) | ORAL | 0 refills | Status: DC | PRN

## 2022-06-01 NOTE — Progress Notes (Signed)
Virtual Visit via Video   I connected with patient on 06/01/22 at  2:40 PM EDT by a video enabled telemedicine application and verified that I am speaking with the correct person using two identifiers.  Location patient: Home Location provider: [place] [facility] Persons participating in the virtual visit: Patient, Provider, CMA Sheryle Hail C)  I discussed the limitations of evaluation and management by telemedicine and the availability of in person appointments. The patient expressed understanding and agreed to proceed.  Subjective:   HPI:   LBP- sxs started 2 weeks ago when he woke w/ pain and spasm in his L lower back.  Pt recently had R knee replaced, has R shoulder injury.  He has had increased physical exertion recently on the weekends.  Typically takes 1 Methocarbamol nightly but has been taking 2 in hopes of pain relief.  Using a heating pad.  Pt reports pain feels more consistent w/ muscle spasm along lower back, into glutes, and into pelvis.  Some improvement w/ use of leftover Flexeril.  Some improvement w/ topical Voltaren.    ROS:   See pertinent positives and negatives per HPI.  Patient Active Problem List   Diagnosis Date Noted   Primary osteoarthritis of right knee 10/03/2021   Primary localized osteoarthritis of right knee 10/03/2021   Ischemic cardiomyopathy 03/09/2021   Persistent atrial fibrillation (HCC) 03/09/2021   ICD (implantable cardioverter-defibrillator) in place - BS 12/05/2020   Coronary artery disease involving native coronary artery of native heart without angina pectoris 09/21/2020   Chronic combined systolic and diastolic congestive heart failure (HCC) 09/21/2020   V-tach (HCC) 09/21/2020   Nasal septal perforation 08/10/2020   TIA (transient ischemic attack) 06/17/2020   GERD (gastroesophageal reflux disease) 08/05/2019   HTN (hypertension) 04/16/2018   Insomnia 01/22/2018   Controlled diabetes mellitus type 2 with complications (HCC)  07/07/2015   Thoracic back pain 04/19/2014   Degenerative disc disease, cervical 08/21/2012   Routine general medical examination at a health care facility 04/17/2012   Hyperlipidemia 04/17/2012   Neck pain 04/17/2012   OBESITY 12/07/2009   FASCIITIS, PLANTAR 03/29/2009   PULMONARY EMBOLISM, HX OF 11/18/2008   NEPHROLITHIASIS, HX OF 11/18/2008   DRUG ABUSE, HX OF 11/18/2008   GOUT 03/22/2008   NUMMULAR ECZEMA 03/22/2008    Social History   Tobacco Use   Smoking status: Some Days    Packs/day: 0.25    Years: 31.00    Total pack years: 7.75    Types: Cigarettes   Smokeless tobacco: Never   Tobacco comments:    Stopped a few weeks ago  Substance Use Topics   Alcohol use: Yes    Comment: occas    Current Outpatient Medications:    Alirocumab (PRALUENT) 150 MG/ML SOAJ, Inject 150 mg into the skin every 14 (fourteen) days., Disp: , Rfl:    allopurinol (ZYLOPRIM) 300 MG tablet, Take 2 tablets (600 mg total) by mouth daily., Disp: 60 tablet, Rfl: 6   allopurinol (ZYLOPRIM) 300 MG tablet, Take 2 tablets by mouth daily., Disp: , Rfl:    amiodarone (PACERONE) 200 MG tablet, TAKE 1/2 TABLET (100 MG TOTAL) BY MOUTH DAILY., Disp: 45 tablet, Rfl: 3   apixaban (ELIQUIS) 5 MG TABS tablet, Take 1 tablet by mouth 2 (two) times daily., Disp: , Rfl:    aspirin EC 81 MG tablet, Take 81 mg by mouth daily. Swallow whole., Disp: , Rfl:    atorvastatin (LIPITOR) 80 MG tablet, TAKE 1 TABLET BY MOUTH DAILY., Disp:  30 tablet, Rfl: 11   atorvastatin (LIPITOR) 80 MG tablet, Take 1 tablet by mouth daily., Disp: , Rfl:    calcium carbonate (TUMS - DOSED IN MG ELEMENTAL CALCIUM) 500 MG chewable tablet, Chew 2-3 tablets by mouth daily as needed for indigestion or heartburn., Disp: , Rfl:    carvedilol (COREG) 12.5 MG tablet, TAKE 1 AND 1/2 TABLETS BY MOUTH 2 TIMES DAILY WITH A MEAL., Disp: 270 tablet, Rfl: 2   cetirizine (ZYRTEC) 10 MG tablet, Take 10 mg by mouth daily., Disp: , Rfl:    dapagliflozin  propanediol (FARXIGA) 10 MG TABS tablet, Take 1 tablet by mouth daily before breakfast., Disp: , Rfl:    ELIQUIS 5 MG TABS tablet, TAKE 1 TABLET BY MOUTH 2 TIMES A DAY, Disp: 60 tablet, Rfl: 11   ENTRESTO 49-51 MG, TAKE 1 TABLET BY MOUTH 2 TIMES DAILY., Disp: 60 tablet, Rfl: 11   Evolocumab (REPATHA SURECLICK) 140 MG/ML SOAJ, Inject 1 Pen into the skin every 14 (fourteen) days., Disp: 6 mL, Rfl: 3   FARXIGA 10 MG TABS tablet, TAKE 1 TABLET BY MOUTH DAILY BEFORE BREAKFAST., Disp: 30 tablet, Rfl: 0   fenofibrate 160 MG tablet, TAKE 1 TABLET BY MOUTH DAILY., Disp: 90 tablet, Rfl: 1   fenofibrate 160 MG tablet, Take 1 tablet by mouth daily., Disp: , Rfl:    icosapent Ethyl (VASCEPA) 1 g capsule, TAKE 2 CAPSULES BY MOUTH 2 TIMES DAILY., Disp: 360 capsule, Rfl: 3   levothyroxine (SYNTHROID) 50 MCG tablet, TAKE 1 TABLET (50 MCG TOTAL) BY MOUTH DAILY 30 MINUTES BEFORE BREAKFAST AND ALL OTHER PILLS., Disp: 90 tablet, Rfl: 3   metFORMIN (GLUCOPHAGE) 1000 MG tablet, TAKE 1 TABLET BY MOUTH 2 TIMES DAILY WITH A MEAL., Disp: 60 tablet, Rfl: 0   metFORMIN (GLUCOPHAGE) 1000 MG tablet, Take 1 tablet by mouth 2 (two) times daily with a meal., Disp: , Rfl:    methocarbamol (ROBAXIN) 500 MG tablet, Take 500 mg by mouth at bedtime., Disp: , Rfl:    methocarbamol (ROBAXIN) 500 MG tablet, Take 1 tablet (500 mg total) by mouth every 6 (six) hours as needed for muscle spasms., Disp: 90 tablet, Rfl: 0   methocarbamol (ROBAXIN) 500 MG tablet, Take 1 tablet by mouth every 6 (six) hours as needed., Disp: , Rfl:    Naphazoline HCl (CLEAR EYES OP), Place 1 drop into both eyes daily as needed (sore eyes)., Disp: , Rfl:    pantoprazole (PROTONIX) 40 MG tablet, TAKE 1 TABLET BY MOUTH DAILY., Disp: 30 tablet, Rfl: 2   pantoprazole (PROTONIX) 40 MG tablet, Take 1 tablet by mouth daily., Disp: , Rfl:    sodium chloride (OCEAN) 0.65 % SOLN nasal spray, Place 1 spray into both nostrils as needed (dryness)., Disp: , Rfl:     spironolactone (ALDACTONE) 25 MG tablet, TAKE 1 TABLET (25 MG TOTAL) BY MOUTH DAILY., Disp: 30 tablet, Rfl: 11   traZODone (DESYREL) 100 MG tablet, TAKE 1 TABLET BY MOUTH AT BEDTIME., Disp: 90 tablet, Rfl: 1   triamcinolone (KENALOG) 0.025 % ointment, Apply 1 application. topically daily as needed (irritation)., Disp: 80 g, Rfl: 3  No Known Allergies  Objective:   There were no vitals taken for this visit. AAOx3, NAD NCAT, EOMI No obvious CN deficits Coloring WNL Pt is able to speak clearly, coherently without shortness of breath or increased work of breathing.  Thought process is linear.  Mood is appropriate.   Assessment and Plan:   LBP- new.  Pt admits  to altering his gait and activity patterns due to recent R knee replacement and R shoulder injury.  No acute injury but has been very active on the weekends.  Woke 2 Monday's ago w/ pain and spasm of L lower back.  No radicular pain.  No bowel or bladder incontinence.  No numbness/tingling.  Some improvement w/ Flexeril, heat, and topical Voltaren.  Due to Eliquis use, will avoid systemic NSAIDs and start Prednisone taper.  Cautioned pt that this will elevate his sugars and he needs to be mindful of his carb intake.  Reviewed supportive care and red flags that should prompt return.  Pt expressed understanding and is in agreement w/ plan.   Neena Rhymes, MD 06/01/2022

## 2022-06-04 ENCOUNTER — Telehealth: Payer: Self-pay | Admitting: Internal Medicine

## 2022-06-04 ENCOUNTER — Other Ambulatory Visit: Payer: Self-pay | Admitting: Orthopedic Surgery

## 2022-06-04 NOTE — Telephone Encounter (Signed)
   Patient Name: Jason Floyd  DOB: 1966/11/12 MRN: [id number]  Primary Cardiologist: Bensimhon/Klein  Chart reviewed as part of pre-operative protocol coverage.   56 year old male with a history of chronic systolic heart failure secondary to NICM, CAD c/ CTO-LAD, VT s/p ICD in 2021, persistent atrial fibrillation on Eliquis, possible TIA, type 2 diabetes, and tobacco use.  Prior EF 15% in 2008 in the setting of polysubstance abuse.  Most recent echo in June 2023 showed EF 40 to 45%.  Cardiac catheterization in August 2021 showed CTO-LAD (100%), otherwise mild nonobstructive CAD.  Found to have VT on cardiac monitor in October 2021 s/p ICD by Dr. Graciela Husbands in October 2021.  He was last seen in the office on 05/10/2022 by Dr. [name] and was doing well from a cardiac standpoint.  We received a surgical clearance request for right shoulder arthroscopy/rotator cuff repair scheduled for 06/12/2022 with Dr. Ave Filter of [facility].  Dr. [name], since you most recently saw this patient, can you please comment on surgical clearance? Please route your response to P CV DI PREOP.   Thank you.   Joylene Grapes, NP 06/04/2022, 4:54 PM

## 2022-06-04 NOTE — Telephone Encounter (Signed)
   Pre-operative Risk Assessment    Patient Name: Jason Floyd  DOB: [birth year] MRN: [id number]      Request for Surgical Clearance    Procedure:   Right Shoulder Arthroscopy Rotator Cuff Repair  Date of Surgery:  Clearance 06/12/22                                 Surgeon:  Dr. Ave Filter Surgeon's Group or Practice Name:  [facility] Phone number:  [phone] Fax number:  [phone]   Type of Clearance Requested:   - Medical  - Pharmacy:  Hold Apixaban (Eliquis) TBD by cardiologist    Type of Anesthesia:   Choice   Additional requests/questions:  Please fax a copy of medical clearance to the surgeon's office.  [name]   06/04/2022, 1:59 PM

## 2022-06-05 ENCOUNTER — Encounter (HOSPITAL_COMMUNITY): Payer: Self-pay | Admitting: Internal Medicine

## 2022-06-05 ENCOUNTER — Encounter: Payer: Self-pay | Admitting: Internal Medicine

## 2022-06-05 NOTE — Progress Notes (Signed)
COVID Vaccine Completed: yes x4  Date of COVID positive in last 90 days:  PCP - Neena Rhymes, MD Cardiologist - Arvilla Meres, MD  Cardiac clearance by Bernadene Person 06/05/22 in Epic   Chest x-ray - 09/25/21 Epic EKG - 01/16/22 Epic  Stress Test - 2008 ECHO - 05/10/22 Epic Cardiac Cath -  Pacemaker/ICD device last checked: 05/24/22 Epic Spinal Cord Stimulator:  Bowel Prep -   Sleep Study -  CPAP -   Fasting Blood Sugar -  Checks Blood Sugar _____ times a day  Blood Thinner Instructions: Eliquis, hold 3 days Aspirin Instructions: ASA 81 Last Dose:  Activity level:  Can go up a flight of stairs and perform activities of daily living without stopping and without symptoms of chest pain or shortness of breath.  Able to exercise without symptoms  Unable to go up a flight of stairs without symptoms of     Anesthesia review: HTN, TIA, CAD, CHF, a fib, ICD, DM2, PE,   Patient denies shortness of breath, fever, cough and chest pain at PAT appointment  Patient verbalized understanding of instructions that were given to them at the PAT appointment. Patient was also instructed that they will need to review over the PAT instructions again at home before surgery.

## 2022-06-05 NOTE — Patient Instructions (Signed)
SURGICAL WAITING ROOM VISITATION Patients having surgery or a procedure may have no more than 2 support people in the waiting area - these visitors may rotate.   Children under the age of 23 must have an adult with them who is not the patient. If the patient needs to stay at the hospital during part of their recovery, the visitor guidelines for inpatient rooms apply. Pre-op nurse will coordinate an appropriate time for 1 support person to accompany patient in pre-op.  This support person may not rotate.    Please refer to the [facility] website for the visitor guidelines for Inpatients (after your surgery is over and you are in a regular room).    Your procedure is scheduled on: 06/14/22   Report to [facility] Main Entrance    Report to admitting at 11:00 AM   Call this number if you have problems the morning of surgery [phone]   Do not eat food :After Midnight.   After Midnight you may have the following liquids until 10:10 AM DAY OF SURGERY  Water Non-Citrus Juices (without pulp, NO RED) Carbonated Beverages Black Coffee (NO MILK/CREAM OR CREAMERS, sugar ok)  Clear Tea (NO MILK/CREAM OR CREAMERS, sugar ok) regular and decaf                             Plain Jell-O (NO RED)                                           Fruit ices (not with fruit pulp, NO RED)                                     Popsicles (NO RED)                                                               Sports drinks like Gatorade (NO RED)     The day of surgery:  Drink ONE (1) Pre-Surgery G2 at 10:10 AM the morning of surgery. Drink in one sitting. Do not sip.  This drink was given to you during your hospital  pre-op appointment visit. Nothing else to drink after completing the  Pre-Surgery G2.          If you have questions, please contact your surgeon's office.   FOLLOW BOWEL PREP AND ANY ADDITIONAL PRE OP INSTRUCTIONS YOU RECEIVED FROM YOUR SURGEON'S OFFICE!!!     Oral Hygiene is also  important to reduce your risk of infection.                                    Remember - BRUSH YOUR TEETH THE MORNING OF SURGERY WITH YOUR REGULAR TOOTHPASTE   Do NOT smoke after Midnight   Take these medicines the morning of surgery with A SIP OF WATER: Allopurinol, Amiodarone, Atorvastatin, Carvedilol, Zyrtec, Fenofibrate, Levothyroxine, Pantoprazole  These are anesthesia recommendations for holding your anticoagulants.  Please contact your prescribing physician to confirm IF it  is safe to hold your anticoagulants for this length of time.   Eliquis Apixaban   72 hours   Xarelto Rivaroxaban   72 hours  Plavix Clopidogrel   120 hours  Pletal Cilostazol   120 hours     DO NOT TAKE ANY ORAL DIABETIC MEDICATIONS DAY OF YOUR SURGERY  How to Manage Your Diabetes Before and After Surgery  Why is it important to control my blood sugar before and after surgery? Improving blood sugar levels before and after surgery helps healing and can limit problems. A way of improving blood sugar control is eating a healthy diet by:  Eating less sugar and carbohydrates  Increasing activity/exercise  Talking with your doctor about reaching your blood sugar goals High blood sugars (greater than 180 mg/dL) can raise your risk of infections and slow your recovery, so you will need to focus on controlling your diabetes during the weeks before surgery. Make sure that the doctor who takes care of your diabetes knows about your planned surgery including the date and location.  How do I manage my blood sugar before surgery? Check your blood sugar at least 4 times a day, starting 2 days before surgery, to make sure that the level is not too high or low. Check your blood sugar the morning of your surgery when you wake up and every 2 hours until you get to the Short Stay unit. If your blood sugar is less than 70 mg/dL, you will need to treat for low blood sugar: Do not take insulin. Treat a low blood sugar (less  than 70 mg/dL) with  cup of clear juice (cranberry or apple), 4 glucose tablets, OR glucose gel. Recheck blood sugar in 15 minutes after treatment (to make sure it is greater than 70 mg/dL). If your blood sugar is not greater than 70 mg/dL on recheck, call [phone] for further instructions. Report your blood sugar to the short stay nurse when you get to Short Stay.  If you are admitted to the hospital after surgery: Your blood sugar will be checked by the staff and you will probably be given insulin after surgery (instead of oral diabetes medicines) to make sure you have good blood sugar levels. The goal for blood sugar control after surgery is 80-180 mg/dL.   WHAT DO I DO ABOUT MY DIABETES MEDICATION?  Do not take oral diabetes medicines (pills) the morning of surgery.  HOLD FARXIGA 3 DAY BEFORE SURGERY  THE DAY BEFORE SURGERY, take Metformin as prescribed.      THE MORNING OF SURGERY, do not take Metformin.  Reviewed and Endorsed by [facility] Patient Education Committee, August 2015                               You may not have any metal on your body including jewelry, and body piercing             Do not wear lotions, powders, cologne, or deodorant              Men may shave face and neck.   Do not bring valuables to the hospital. [facility] IS NOT             RESPONSIBLE   FOR VALUABLES.  DO NOT BRING YOUR HOME MEDICATIONS TO THE HOSPITAL. PHARMACY WILL DISPENSE MEDICATIONS LISTED ON YOUR MEDICATION LIST TO YOU DURING YOUR ADMISSION IN THE HOSPITAL!    Patients discharged on  the day of surgery will not be allowed to drive home.  Someone NEEDS to stay with you for the first 24 hours after anesthesia.              Please read over the following fact sheets you were given: IF YOU HAVE QUESTIONS ABOUT YOUR PRE-OP INSTRUCTIONS PLEASE CALL [phone]- [facility] - Preparing for Surgery Before surgery, you can play an important role.  Because skin is not  sterile, your skin needs to be as free of germs as possible.  You can reduce the number of germs on your skin by washing with CHG (chlorahexidine gluconate) soap before surgery.  CHG is an antiseptic cleaner which kills germs and bonds with the skin to continue killing germs even after washing. Please DO NOT use if you have an allergy to CHG or antibacterial soaps.  If your skin becomes reddened/irritated stop using the CHG and inform your nurse when you arrive at Short Stay. Do not shave (including legs and underarms) for at least 48 hours prior to the first CHG shower.  You may shave your face/neck.  Please follow these instructions carefully:  1.  Shower with CHG Soap the night before surgery and the  morning of surgery.  2.  If you choose to wash your hair, wash your hair first as usual with your normal  shampoo.  3.  After you shampoo, rinse your hair and body thoroughly to remove the shampoo.                             4.  Use CHG as you would any other liquid soap.  You can apply chg directly to the skin and wash.  Gently with a scrungie or clean washcloth.  5.  Apply the CHG Soap to your body ONLY FROM THE NECK DOWN.   Do   not use on face/ open                           Wound or open sores. Avoid contact with eyes, ears mouth and   genitals (private parts).                       Wash face,  Genitals (private parts) with your normal soap.             6.  Wash thoroughly, paying special attention to the area where your    surgery  will be performed.  7.  Thoroughly rinse your body with warm water from the neck down.  8.  DO NOT shower/wash with your normal soap after using and rinsing off the CHG Soap.                9.  Pat yourself dry with a clean towel.            10.  Wear clean pajamas.            11.  Place clean sheets on your bed the night of your first shower and do not  sleep with pets. Day of Surgery : Do not apply any lotions/deodorants the morning of surgery.  Please wear  clean clothes to the hospital/surgery center.  FAILURE TO FOLLOW THESE INSTRUCTIONS MAY RESULT IN THE CANCELLATION OF YOUR SURGERY  PATIENT SIGNATURE_________________________________  NURSE SIGNATURE__________________________________  ________________________________________________________________________   Jason Floyd  An incentive  spirometer is a tool that can help keep your lungs clear and active. This tool measures how well you are filling your lungs with each breath. Taking long deep breaths may help reverse or decrease the chance of developing breathing (pulmonary) problems (especially infection) following: A long period of time when you are unable to move or be active. BEFORE THE PROCEDURE  If the spirometer includes an indicator to show your best effort, your nurse or respiratory therapist will set it to a desired goal. If possible, sit up straight or lean slightly forward. Try not to slouch. Hold the incentive spirometer in an upright position. INSTRUCTIONS FOR USE  Sit on the edge of your bed if possible, or sit up as far as you can in bed or on a chair. Hold the incentive spirometer in an upright position. Breathe out normally. Place the mouthpiece in your mouth and seal your lips tightly around it. Breathe in slowly and as deeply as possible, raising the piston or the ball toward the top of the column. Hold your breath for 3-5 seconds or for as long as possible. Allow the piston or ball to fall to the bottom of the column. Remove the mouthpiece from your mouth and breathe out normally. Rest for a few seconds and repeat Steps 1 through 7 at least 10 times every 1-2 hours when you are awake. Take your time and take a few normal breaths between deep breaths. The spirometer may include an indicator to show your best effort. Use the indicator as a goal to work toward during each repetition. After each set of 10 deep breaths, practice coughing to be sure your lungs are  clear. If you have an incision (the cut made at the time of surgery), support your incision when coughing by placing a pillow or rolled up towels firmly against it. Once you are able to get out of bed, walk around indoors and cough well. You may stop using the incentive spirometer when instructed by your caregiver.  RISKS AND COMPLICATIONS Take your time so you do not get dizzy or light-headed. If you are in pain, you may need to take or ask for pain medication before doing incentive spirometry. It is harder to take a deep breath if you are having pain. AFTER USE Rest and breathe slowly and easily. It can be helpful to keep track of a log of your progress. Your caregiver can provide you with a simple table to help with this. If you are using the spirometer at home, follow these instructions: [place] IF:  You are having difficultly using the spirometer. You have trouble using the spirometer as often as instructed. Your pain medication is not giving enough relief while using the spirometer. You develop fever of 100.5 F (38.1 C) or higher. SEEK IMMEDIATE MEDICAL CARE IF:  You cough up bloody sputum that had not been present before. You develop fever of 102 F (38.9 C) or greater. You develop worsening pain at or near the incision site. MAKE SURE YOU:  Understand these instructions. Will watch your condition. Will get help right away if you are not doing well or get worse. Document Released: 03/11/2007 Document Revised: 01/21/2012 Document Reviewed: 05/12/2007 [facility] Patient Information 2014 [place], [place].   ________________________________________________________________________

## 2022-06-05 NOTE — Telephone Encounter (Signed)
   Patient Name: Jason Floyd  DOB: 1966/09/07 MRN: [id number]  Primary Cardiologist: Dr. [name]  Chart reviewed as part of pre-operative protocol coverage. Given past medical history and time since last visit, based on ACC/AHA guidelines, and per Dr. [name], primary cardiologist, [name] would be at acceptable risk for the planned procedure without further cardiovascular testing.   Patient with diagnosis of afib on Eliquis for anticoagulation.     Procedure: Right Shoulder Arthroscopy Rotator Cuff Repair Date of procedure: 06/12/22     CHA2DS2-VASc Score = 6   This indicates a 9.7% annual risk of stroke. The patient's score is based upon: CHF History: 1 HTN History: 1 Diabetes History: 1 Stroke History: 2 Vascular Disease History: 1 Age Score: 0 Gender Score: 0       CrCl 92 ml/min   Patient does have a hx of possible TIA. He was cleared to hold Eliquis for 3 days prior to knee surgery in 2022 without a bridge.   Therefore, patient may hold Eliquis 3 days prior to procedure.  I will route this recommendation to the requesting party via Epic fax function and remove from pre-op pool.  Please call with questions.  Joylene Grapes, NP 06/05/2022, 11:51 AM

## 2022-06-05 NOTE — Progress Notes (Signed)
PERIOPERATIVE PRESCRIPTION FOR IMPLANTED CARDIAC DEVICE PROGRAMMING  Patient Information: Name:  Jason Floyd  DOB:  02-21-1966  MRN:  [id number]    Planned Procedure:  Right shoulder arthroscopy with rotator cuff repair  Surgeon:  Dr. Ave Filter  Date of Procedure:  06/14/22  Cautery will be used.  Position during surgery:  unknown   Please send documentation back to:  [facility] (Fax # [phone])  Device Information:  Clinic EP Physician:  Sherryl Manges, MD   Device Type:  Defibrillator Manufacturer and Phone #:  [employer]: [phone] Pacemaker Dependent?:  No. Date of Last Device Check:  05/24/22 Normal Device Function?:  Yes.    Electrophysiologist's Recommendations:  Have magnet available. Provide continuous ECG monitoring when magnet is used or reprogramming is to be performed.  Procedure will likely interfere with device function.  Device should be programmed:  Tachy therapies disabled  Per Device Clinic Standing Orders, Lenor Coffin, RN  3:24 PM 06/05/2022

## 2022-06-05 NOTE — Progress Notes (Signed)
Please place orders for PAT appointment scheduled 06/07/22. 

## 2022-06-05 NOTE — Telephone Encounter (Signed)
Patient with diagnosis of afib on Eliquis for anticoagulation.    Procedure: Right Shoulder Arthroscopy Rotator Cuff Repair Date of procedure: 06/12/22   CHA2DS2-VASc Score = 6   This indicates a 9.7% annual risk of stroke. The patient's score is based upon: CHF History: 1 HTN History: 1 Diabetes History: 1 Stroke History: 2 Vascular Disease History: 1 Age Score: 0 Gender Score: 0      CrCl 92 ml/min  Patient does have a hx of possible TIA. He was cleared to hold Eliquis for 3 days prior to knee surgery in 2022 without a bridge.  Therefore, patient may hold Eliquis 3 days prior to procedure.  **This guidance is not considered finalized until pre-operative APP has relayed final recommendations.**

## 2022-06-06 ENCOUNTER — Other Ambulatory Visit: Payer: Self-pay | Admitting: Orthopedic Surgery

## 2022-06-07 ENCOUNTER — Encounter: Payer: Self-pay | Admitting: Family Medicine

## 2022-06-07 ENCOUNTER — Encounter (HOSPITAL_COMMUNITY): Payer: Self-pay

## 2022-06-07 ENCOUNTER — Encounter (HOSPITAL_COMMUNITY)
Admission: RE | Admit: 2022-06-07 | Discharge: 2022-06-07 | Disposition: A | Discharge status: Home / Self Care | Payer: BC Managed Care – PPO | Source: Ambulatory Visit | Attending: Orthopedic Surgery | Admitting: Orthopedic Surgery

## 2022-06-07 VITALS — BP 99/68 | HR 74 | Temp 98.5°F | Resp 12 | Ht 74.0 in | Wt 233.8 lb

## 2022-06-07 DIAGNOSIS — I255 Ischemic cardiomyopathy: Secondary | ICD-10-CM

## 2022-06-07 DIAGNOSIS — F17200 Nicotine dependence, unspecified, uncomplicated: Secondary | ICD-10-CM | POA: Insufficient documentation

## 2022-06-07 DIAGNOSIS — M25811 Other specified joint disorders, right shoulder: Secondary | ICD-10-CM | POA: Insufficient documentation

## 2022-06-07 DIAGNOSIS — I4819 Other persistent atrial fibrillation: Secondary | ICD-10-CM

## 2022-06-07 DIAGNOSIS — I2510 Atherosclerotic heart disease of native coronary artery without angina pectoris: Secondary | ICD-10-CM | POA: Insufficient documentation

## 2022-06-07 DIAGNOSIS — M545 Low back pain, unspecified: Secondary | ICD-10-CM

## 2022-06-07 DIAGNOSIS — Z01812 Encounter for preprocedural laboratory examination: Secondary | ICD-10-CM | POA: Diagnosis not present

## 2022-06-07 DIAGNOSIS — I509 Heart failure, unspecified: Secondary | ICD-10-CM | POA: Insufficient documentation

## 2022-06-07 DIAGNOSIS — E118 Type 2 diabetes mellitus with unspecified complications: Secondary | ICD-10-CM | POA: Insufficient documentation

## 2022-06-07 DIAGNOSIS — Z95810 Presence of automatic (implantable) cardiac defibrillator: Secondary | ICD-10-CM | POA: Diagnosis not present

## 2022-06-07 DIAGNOSIS — I4891 Unspecified atrial fibrillation: Secondary | ICD-10-CM | POA: Insufficient documentation

## 2022-06-07 DIAGNOSIS — M75101 Unspecified rotator cuff tear or rupture of right shoulder, not specified as traumatic: Secondary | ICD-10-CM | POA: Diagnosis not present

## 2022-06-07 HISTORY — DX: Unspecified atrial fibrillation: I48.91

## 2022-06-07 HISTORY — DX: Essential (primary) hypertension: I10

## 2022-06-07 HISTORY — DX: Ventricular tachycardia, unspecified: I47.20

## 2022-06-07 LAB — BASIC METABOLIC PANEL
Anion gap: 7 (ref 5–15)
BUN: 22 mg/dL — ABNORMAL HIGH (ref 6–20)
CO2: 25 mmol/L (ref 22–32)
Calcium: 9.9 mg/dL (ref 8.9–10.3)
Chloride: 106 mmol/L (ref 98–111)
Creatinine, Ser: 1.06 mg/dL (ref 0.61–1.24)
GFR, Estimated: >60 mL/min (ref >60)
Glucose, Bld: 165 mg/dL — ABNORMAL HIGH (ref 70–99)
Potassium: 4.6 mmol/L (ref 3.5–5.1)
Sodium: 138 mmol/L (ref 135–145)

## 2022-06-07 LAB — CBC
HCT: 47 % (ref 39.0–52.0)
Hemoglobin: 15.7 g/dL (ref 13.0–17.0)
MCH: 31.5 pg (ref 26.0–34.0)
MCHC: 33.4 g/dL (ref 30.0–36.0)
MCV: 94.4 fL (ref 80.0–100.0)
Platelets: 311 10*3/uL (ref 150–400)
RBC: 4.98 MIL/uL (ref 4.22–5.81)
RDW: 13.2 % (ref 11.5–15.5)
WBC: 15 10*3/uL — ABNORMAL HIGH (ref 4.0–10.5)
nRBC: 0 % (ref 0.0–0.2)

## 2022-06-07 LAB — HEMOGLOBIN A1C
Hgb A1c MFr Bld: 7 % — ABNORMAL HIGH (ref 4.8–5.6)
Mean Plasma Glucose: 154.2 mg/dL

## 2022-06-07 LAB — GLUCOSE, CAPILLARY: Glucose-Capillary: 174 mg/dL — ABNORMAL HIGH (ref 70–99)

## 2022-06-07 NOTE — Progress Notes (Signed)
WBC 15, results routed to Dr. Ave Filter

## 2022-06-07 NOTE — Progress Notes (Signed)
Spoke with [employer] rep [employer]. Gave electrophysiologist recommendations for patient's surgery. Per rep does not need to be present for surgery as long as magnet is available. Shanda Bumps, PA made aware.

## 2022-06-08 NOTE — Progress Notes (Signed)
Anesthesia Chart Review   Case: [id number] Date/Time: 06/14/22 1255   Procedure: SHOULDER ARTHROSCOPY WITH ROTATOR CUFF REPAIR, SUBACROMIAL DECOMPRESSION AND TENOTOMY (Right)   Anesthesia type: Choice   Pre-op diagnosis: RIGHT SHOULDER ROTATOR CUFF TEAR, IMPINGEMENT, BICEPS TENDINOPATHY   Location: [facility] ROOM 07 / [facility]   Surgeons: Jones Broom, MD       DISCUSSION:56 y.o. smoker with h/o DM II, PE, atrial fibrillation (on Eliquis), CAD, CHF, AICD in place, right shoulder rotator cuff tear scheduled for above procedure 06/14/2022 with Dr. Jones Broom.   Device orders in 06/05/22 progress note, device should be programmed: tachy therapies disabled.  Device rep contacted by PAT nurse.   Per cardiology preoperative evaluation 06/05/2022, "Chart reviewed as part of pre-operative protocol coverage. Given past medical history and time since last visit, based on ACC/AHA guidelines, and per Dr. [name], primary cardiologist, Jason Floyd would be at acceptable risk for the planned procedure without further cardiovascular testing."  Pt advised to hold Eliquis 3 days prior to procedure.   Anticipate pt can proceed with planned procedure barring acute status change.   VS: BP 99/68   Pulse 74   Temp 36.9 C (Oral)   Resp 12   Ht 6\' 2"  (1.88 m)   Wt 106.1 kg   SpO2 98%   BMI 30.02 kg/m   PROVIDERS: , MD is PCP   Cardiologist - Sheliah Hatch, MD LABS: Labs reviewed: Acceptable for surgery. (all labs ordered are listed, but only abnormal results are displayed)  Labs Reviewed  HEMOGLOBIN A1C - Abnormal; Notable for the following components:      Result Value   Hgb A1c MFr Bld 7.0 (*)    All other components within normal limits  BASIC METABOLIC PANEL - Abnormal; Notable for the following components:   Glucose, Bld 165 (*)    BUN 22 (*)    All other components within normal limits  CBC - Abnormal; Notable for the following components:   WBC 15.0 (*)    All  other components within normal limits  GLUCOSE, CAPILLARY - Abnormal; Notable for the following components:   Glucose-Capillary 174 (*)    All other components within normal limits     IMAGES:   EKG: 01/16/2022 Rate 70 bpm   CV: Echo 05/10/2022 1. Left ventricular ejection fraction, by estimation, is 35 to 40%. The  left ventricle has moderately decreased function. The left ventricle  demonstrates global hypokinesis. The left ventricular internal cavity size  was moderately dilated. Left  ventricular diastolic parameters are consistent with Grade I diastolic  dysfunction (impaired relaxation).   2. AICD lead in RV. Right ventricular systolic function is normal. The  right ventricular size is normal.   3. Left atrial size was severely dilated.   4. Right atrial size was mildly dilated.   5. The mitral valve is normal in structure. Mild mitral valve  regurgitation. No evidence of mitral stenosis.   6. The aortic valve is tricuspid. Aortic valve regurgitation is not  visualized. No aortic stenosis is present.   7. The inferior vena cava is normal in size with greater than 50%  respiratory variability, suggesting right atrial pressure of 3 mmHg Past Medical History:  Diagnosis Date   AICD (automatic cardioverter/defibrillator) present    Allergy    Arthritis    Atrial fibrillation (HCC)    CHF (congestive heart failure) (HCC)    CHF (congestive heart failure) (HCC)    Diabetes mellitus without complication (HCC)  Drug abuse (HCC)    Eczema    GERD (gastroesophageal reflux disease)    Gout    Heart attack (HCC) 06/10/2020   History of kidney stones    Hyperlipidemia    Hypertension    MI (myocardial infarction) (HCC)    PE (pulmonary embolism)    TIA (transient ischemic attack)    hx of per pt - pt not aware he had not followed by a neurologist   Ventricular tachycardia [facility])     Past Surgical History:  Procedure Laterality Date   CERVICAL FUSION     ICD IMPLANT  N/A 08/22/2020   Procedure: ICD IMPLANT;  Surgeon: Duke Salvia, MD;  Location: [facility] INVASIVE CV LAB;  Service: Cardiovascular;  Laterality: N/A;   KNEE ARTHROSCOPY Right 10/28/2020   Procedure: RIGHT KNEE ARTHROSCOPY, PARTIAL MEDIAL MENISCECTOMY, CHONDROPLASTY;  Surgeon: Marcene Corning, MD;  Location: [facility];  Service: Orthopedics;  Laterality: Right;   left thumb surgery      RIGHT/LEFT HEART CATH AND CORONARY ANGIOGRAPHY N/A 07/01/2020   Procedure: RIGHT/LEFT HEART CATH AND CORONARY ANGIOGRAPHY;  Surgeon: Dolores Patty, MD;  Location: [facility] INVASIVE CV LAB;  Service: Cardiovascular;  Laterality: N/A;   SHOULDER ARTHROSCOPY Left    TOTAL KNEE ARTHROPLASTY Right 10/03/2021   Procedure: RIGHT TOTAL KNEE ARTHROPLASTY;  Surgeon: Marcene Corning, MD;  Location: [facility];  Service: Orthopedics;  Laterality: Right;   VASECTOMY Bilateral    WISDOM TOOTH EXTRACTION      MEDICATIONS:  Alirocumab (PRALUENT) 150 MG/ML SOAJ   allopurinol (ZYLOPRIM) 300 MG tablet   amiodarone (PACERONE) 200 MG tablet   aspirin EC 81 MG tablet   atorvastatin (LIPITOR) 80 MG tablet   calcium carbonate (TUMS - DOSED IN MG ELEMENTAL CALCIUM) 500 MG chewable tablet   carvedilol (COREG) 12.5 MG tablet   cetirizine (ZYRTEC) 10 MG tablet   cyclobenzaprine (FLEXERIL) 10 MG tablet   ELIQUIS 5 MG TABS tablet   ENTRESTO 49-51 MG   Evolocumab (REPATHA SURECLICK) 140 MG/ML SOAJ   FARXIGA 10 MG TABS tablet   fenofibrate 160 MG tablet   icosapent Ethyl (VASCEPA) 1 g capsule   levothyroxine (SYNTHROID) 50 MCG tablet   metFORMIN (GLUCOPHAGE) 1000 MG tablet   methocarbamol (ROBAXIN) 500 MG tablet   Naphazoline HCl (CLEAR EYES OP)   pantoprazole (PROTONIX) 40 MG tablet   predniSONE (DELTASONE) 10 MG tablet   sodium chloride (OCEAN) 0.65 % SOLN nasal spray   spironolactone (ALDACTONE) 25 MG tablet   traZODone (DESYREL) 100 MG tablet   triamcinolone (KENALOG) 0.025 % ointment   No current facility-administered medications  for this encounter.   Jodell Cipro Ward, PA-C [facility] [phone]

## 2022-06-08 NOTE — Anesthesia Preprocedure Evaluation (Signed)
Anesthesia Evaluation  Patient identified by MRN, date of birth, ID band Patient awake    Reviewed: Allergy & Precautions, NPO status , Patient's Chart, lab work & pertinent test results  Airway Mallampati: II  TM Distance: >3 FB Neck ROM: Full    Dental no notable dental hx.    Pulmonary Current Smoker, PE   Pulmonary exam normal        Cardiovascular hypertension, Pt. on medications and Pt. on home beta blockers + CAD, + Past MI and +CHF  + dysrhythmias Atrial Fibrillation + Cardiac Defibrillator  Rhythm:Irregular Rate:Normal     Neuro/Psych TIAnegative psych ROS   GI/Hepatic Neg liver ROS, GERD  Medicated,  Endo/Other  diabetes, Oral Hypoglycemic Agents  Renal/GU negative Renal ROS  negative genitourinary   Musculoskeletal  (+) Arthritis , Osteoarthritis,    Abdominal Normal abdominal exam  (+)   Peds  Hematology negative hematology ROS (+)   Anesthesia Other Findings   Reproductive/Obstetrics                            Anesthesia Physical Anesthesia Plan  ASA: 3  Anesthesia Plan: General and Regional   Post-op Pain Management: Regional block*   Induction: Intravenous  PONV Risk Score and Plan: 1 and Ondansetron, Dexamethasone, Midazolam and Treatment may vary due to age or medical condition  Airway Management Planned: Mask and Oral ETT  Additional Equipment: None  Intra-op Plan:   Post-operative Plan: Extubation in OR  Informed Consent: I have reviewed the patients History and Physical, chart, labs and discussed the procedure including the risks, benefits and alternatives for the proposed anesthesia with the patient or authorized representative who has indicated his/her understanding and acceptance.     Dental advisory given  Plan Discussed with: CRNA  Anesthesia Plan Comments: (See PAT note 06/07/2022  Lab Results      Component                Value                Date                      WBC                      15.0 (H)            06/07/2022                HGB                      15.7                06/07/2022                HCT                      47.0                06/07/2022                MCV                      94.4                06/07/2022                PLT  311                 06/07/2022           Lab Results      Component                Value               Date                      NA                       138                 06/07/2022                K                        4.6                 06/07/2022                CO2                      25                  06/07/2022                GLUCOSE                  165 (H)             06/07/2022                BUN                      22 (H)              06/07/2022                CREATININE               1.06                06/07/2022                CALCIUM                  9.9                 06/07/2022                GFRNONAA                 >60                 06/07/2022            Echo 05/10/2022 1. Left ventricular ejection fraction, by estimation, is 35 to 40%. The  left ventricle has moderately decreased function. The left ventricle  demonstrates global hypokinesis. The left ventricular internal cavity size  was moderately dilated. Left  ventricular diastolic parameters are consistent with Grade I diastolic  dysfunction (impaired relaxation).  2. AICD lead in RV. Right ventricular systolic function is normal. The  right ventricular size is normal.  3. Left atrial size was severely dilated.  4. Right atrial size was mildly dilated.  5. The mitral valve is normal in structure. Mild  mitral valve  regurgitation. No evidence of mitral stenosis.  6. The aortic valve is tricuspid. Aortic valve regurgitation is not  visualized. No aortic stenosis is present.  7. The inferior vena cava is normal in size with greater than 50%  respiratory variability,  suggesting right atrial pressure of 3 mmHg )       Anesthesia Quick Evaluation

## 2022-06-08 NOTE — Progress Notes (Signed)
Remote ICD transmission.   

## 2022-06-12 ENCOUNTER — Other Ambulatory Visit: Payer: Self-pay

## 2022-06-12 ENCOUNTER — Other Ambulatory Visit: Payer: Self-pay | Admitting: Registered Nurse

## 2022-06-12 ENCOUNTER — Other Ambulatory Visit: Payer: Self-pay | Admitting: Family Medicine

## 2022-06-12 MED ORDER — METHOCARBAMOL 500 MG PO TABS: 500.0000 mg | ORAL_TABLET | Freq: Four times a day (QID) | ORAL | 0 refills | Status: DC | PRN

## 2022-06-12 MED ORDER — ALLOPURINOL 300 MG PO TABS: 600.0000 mg | ORAL_TABLET | Freq: Every day | ORAL | 6 refills | Status: DC

## 2022-06-12 MED ORDER — CYCLOBENZAPRINE HCL 10 MG PO TABS: 10.0000 mg | ORAL_TABLET | Freq: Three times a day (TID) | ORAL | 0 refills | Status: DC | PRN

## 2022-06-14 ENCOUNTER — Encounter (HOSPITAL_COMMUNITY)
Admission: RE | Disposition: A | Discharge status: Home / Self Care | Payer: Self-pay | Source: Ambulatory Visit | Attending: Orthopedic Surgery

## 2022-06-14 ENCOUNTER — Ambulatory Visit (HOSPITAL_COMMUNITY): Payer: BC Managed Care – PPO | Admitting: Anesthesiology

## 2022-06-14 ENCOUNTER — Other Ambulatory Visit: Payer: Self-pay

## 2022-06-14 ENCOUNTER — Ambulatory Visit (HOSPITAL_COMMUNITY)
Admission: RE | Admit: 2022-06-14 | Discharge: 2022-06-14 | Disposition: A | Discharge status: Home / Self Care | Payer: BC Managed Care – PPO | Source: Ambulatory Visit | Attending: Orthopedic Surgery | Admitting: Orthopedic Surgery

## 2022-06-14 ENCOUNTER — Ambulatory Visit (HOSPITAL_COMMUNITY): Payer: BC Managed Care – PPO | Admitting: Physician Assistant

## 2022-06-14 ENCOUNTER — Encounter (HOSPITAL_COMMUNITY): Payer: Self-pay | Admitting: Orthopedic Surgery

## 2022-06-14 DIAGNOSIS — M25811 Other specified joint disorders, right shoulder: Secondary | ICD-10-CM | POA: Insufficient documentation

## 2022-06-14 DIAGNOSIS — M75121 Complete rotator cuff tear or rupture of right shoulder, not specified as traumatic: Secondary | ICD-10-CM | POA: Diagnosis not present

## 2022-06-14 DIAGNOSIS — I255 Ischemic cardiomyopathy: Secondary | ICD-10-CM

## 2022-06-14 DIAGNOSIS — E118 Type 2 diabetes mellitus with unspecified complications: Secondary | ICD-10-CM

## 2022-06-14 DIAGNOSIS — I509 Heart failure, unspecified: Secondary | ICD-10-CM | POA: Insufficient documentation

## 2022-06-14 DIAGNOSIS — M75111 Incomplete rotator cuff tear or rupture of right shoulder, not specified as traumatic: Secondary | ICD-10-CM | POA: Diagnosis present

## 2022-06-14 DIAGNOSIS — I110 Hypertensive heart disease with heart failure: Secondary | ICD-10-CM | POA: Diagnosis not present

## 2022-06-14 HISTORY — PX: SHOULDER ARTHROSCOPY WITH ROTATOR CUFF REPAIR AND SUBACROMIAL DECOMPRESSION: SHX5686

## 2022-06-14 LAB — GLUCOSE, CAPILLARY: Glucose-Capillary: 182 mg/dL — ABNORMAL HIGH (ref 70–99)

## 2022-06-14 SURGERY — SHOULDER ARTHROSCOPY WITH ROTATOR CUFF REPAIR AND SUBACROMIAL DECOMPRESSION
Anesthesia: Regional | Laterality: Right

## 2022-06-14 MED ORDER — EPHEDRINE SULFATE (PRESSORS) 50 MG/ML IJ SOLN
INTRAMUSCULAR | Status: DC | PRN
  Administered 2022-06-14: 10 mg via INTRAVENOUS

## 2022-06-14 MED ORDER — CHLORHEXIDINE GLUCONATE 0.12 % MT SOLN
15.0000 mL | Freq: Once | OROMUCOSAL | Status: AC
  Administered 2022-06-14: 15 mL via OROMUCOSAL

## 2022-06-14 MED ORDER — LIDOCAINE HCL (CARDIAC) PF 100 MG/5ML IV SOSY
PREFILLED_SYRINGE | INTRAVENOUS | Status: DC | PRN
  Administered 2022-06-14: 80 mg via INTRAVENOUS

## 2022-06-14 MED ORDER — SUGAMMADEX SODIUM 500 MG/5ML IV SOLN
INTRAVENOUS | Status: DC | PRN
  Administered 2022-06-14: 200 mg via INTRAVENOUS

## 2022-06-14 MED ORDER — ROCURONIUM BROMIDE 100 MG/10ML IV SOLN
INTRAVENOUS | Status: DC | PRN
  Administered 2022-06-14: 60 mg via INTRAVENOUS

## 2022-06-14 MED ORDER — ACETAMINOPHEN 500 MG PO TABS: 1000.0000 mg | ORAL_TABLET | Freq: Four times a day (QID) | ORAL | Status: DC

## 2022-06-14 MED ORDER — OXYCODONE HCL 5 MG PO TABS: 5.0000 mg | ORAL_TABLET | ORAL | 0 refills | Status: DC | PRN

## 2022-06-14 MED ORDER — PHENYLEPHRINE HCL-NACL 20-0.9 MG/250ML-% IV SOLN
INTRAVENOUS | Status: DC | PRN
  Administered 2022-06-14: 40 ug/min via INTRAVENOUS

## 2022-06-14 MED ORDER — KETAMINE HCL 10 MG/ML IJ SOLN
INTRAMUSCULAR | Status: AC
Start: 2022-06-14 — End: ?
  Filled 2022-06-14: qty 1

## 2022-06-14 MED ORDER — PHENYLEPHRINE HCL (PRESSORS) 10 MG/ML IV SOLN
INTRAVENOUS | Status: AC
Start: 2022-06-14 — End: ?
  Filled 2022-06-14: qty 1

## 2022-06-14 MED ORDER — LACTATED RINGERS IV SOLN: INTRAVENOUS | Status: DC

## 2022-06-14 MED ORDER — CEFAZOLIN SODIUM-DEXTROSE 2-4 GM/100ML-% IV SOLN
2.0000 g | INTRAVENOUS | Status: AC
  Administered 2022-06-14: 2 g via INTRAVENOUS
  Filled 2022-06-14: qty 100

## 2022-06-14 MED ORDER — METHOCARBAMOL 500 MG IVPB - SIMPLE MED: 500.0000 mg | Freq: Four times a day (QID) | INTRAVENOUS | Status: DC | PRN

## 2022-06-14 MED ORDER — PROPOFOL 10 MG/ML IV BOLUS
INTRAVENOUS | Status: DC | PRN
  Administered 2022-06-14: 160 mg via INTRAVENOUS

## 2022-06-14 MED ORDER — ORAL CARE MOUTH RINSE: 15.0000 mL | Freq: Once | OROMUCOSAL | Status: AC

## 2022-06-14 MED ORDER — ONDANSETRON HCL 4 MG/2ML IJ SOLN
INTRAMUSCULAR | Status: DC | PRN
  Administered 2022-06-14: 4 mg via INTRAVENOUS

## 2022-06-14 MED ORDER — MIDAZOLAM HCL 2 MG/2ML IJ SOLN
2.0000 mg | INTRAMUSCULAR | Status: AC
  Administered 2022-06-14: 2 mg via INTRAVENOUS
  Filled 2022-06-14: qty 2

## 2022-06-14 MED ORDER — MIDAZOLAM HCL 2 MG/2ML IJ SOLN
INTRAMUSCULAR | Status: AC
  Filled 2022-06-14: qty 2

## 2022-06-14 MED ORDER — FENTANYL CITRATE (PF) 100 MCG/2ML IJ SOLN
INTRAMUSCULAR | Status: DC | PRN
  Administered 2022-06-14: 100 ug via INTRAVENOUS

## 2022-06-14 MED ORDER — PROPOFOL 10 MG/ML IV BOLUS
INTRAVENOUS | Status: AC
  Filled 2022-06-14: qty 20

## 2022-06-14 MED ORDER — BUPIVACAINE LIPOSOME 1.3 % IJ SUSP
INTRAMUSCULAR | Status: DC | PRN
  Administered 2022-06-14: 10 mL via PERINEURAL

## 2022-06-14 MED ORDER — MIDAZOLAM HCL 5 MG/5ML IJ SOLN
INTRAMUSCULAR | Status: DC | PRN
  Administered 2022-06-14: 2 mg via INTRAVENOUS

## 2022-06-14 MED ORDER — 0.9 % SODIUM CHLORIDE (POUR BTL) OPTIME
TOPICAL | Status: DC | PRN
  Administered 2022-06-14: 1000 mL

## 2022-06-14 MED ORDER — PHENYLEPHRINE HCL (PRESSORS) 10 MG/ML IV SOLN
INTRAVENOUS | Status: DC | PRN
  Administered 2022-06-14: 240 ug via INTRAVENOUS

## 2022-06-14 MED ORDER — FENTANYL CITRATE (PF) 100 MCG/2ML IJ SOLN
INTRAMUSCULAR | Status: AC
  Filled 2022-06-14: qty 2

## 2022-06-14 MED ORDER — FENTANYL CITRATE PF 50 MCG/ML IJ SOSY
100.0000 ug | PREFILLED_SYRINGE | INTRAMUSCULAR | Status: AC
  Administered 2022-06-14: 50 ug via INTRAVENOUS
  Filled 2022-06-14: qty 2

## 2022-06-14 MED ORDER — SODIUM CHLORIDE 0.9 % IR SOLN
Status: DC | PRN
  Administered 2022-06-14: 6000 mL

## 2022-06-14 MED ORDER — METHOCARBAMOL 500 MG PO TABS: 500.0000 mg | ORAL_TABLET | Freq: Four times a day (QID) | ORAL | Status: DC | PRN

## 2022-06-14 MED ORDER — DEXAMETHASONE SODIUM PHOSPHATE 10 MG/ML IJ SOLN
INTRAMUSCULAR | Status: DC | PRN
  Administered 2022-06-14 (×2): 10 mg via INTRAVENOUS

## 2022-06-14 MED ORDER — BUPIVACAINE HCL (PF) 0.5 % IJ SOLN
INTRAMUSCULAR | Status: DC | PRN
  Administered 2022-06-14: 15 mL via PERINEURAL

## 2022-06-14 SURGICAL SUPPLY — 66 items
AID PSTN UNV HD RSTRNT DISP (MISCELLANEOUS)
ANCH SUT SWLK 19.1X4.75 VT (Anchor) ×2 IMPLANT
ANCHOR PEEK 4.75X19.1 SWLK C (Anchor) ×2 IMPLANT
BAG COUNTER SPONGE SURGICOUNT (BAG) IMPLANT
BAG SPNG CNTER NS LX DISP (BAG)
BOOTIES KNEE HIGH SLOAN (MISCELLANEOUS) ×4 IMPLANT
BURR OVAL 8 FLU 4.0X13 (MISCELLANEOUS) ×3 IMPLANT
CANNULA 5.75X7 CRYSTAL CLEAR (CANNULA) ×2 IMPLANT
CANNULA TWIST IN 8.25X7CM (CANNULA) ×1 IMPLANT
COOLER ICEMAN CLASSIC (MISCELLANEOUS) IMPLANT
COVER SURGICAL LIGHT HANDLE (MISCELLANEOUS) ×2 IMPLANT
CUTTER BONE 4.0MM X 13CM (MISCELLANEOUS) ×3 IMPLANT
DISSECTOR  3.8MM X 13CM (MISCELLANEOUS)
DISSECTOR 3.8MM X 13CM (MISCELLANEOUS) IMPLANT
DRAPE IMP U-DRAPE 54X76 (DRAPES) ×2 IMPLANT
DRAPE INCISE IOBAN 66X45 STRL (DRAPES) IMPLANT
DRAPE ORTHO SPLIT 77X108 STRL (DRAPES) ×4
DRAPE STERI 35X30 U-POUCH (DRAPES) ×2 IMPLANT
DRAPE SURG ORHT 6 SPLT 77X108 (DRAPES) ×2 IMPLANT
DRAPE U-SHAPE 47X51 STRL (DRAPES) ×4 IMPLANT
DRSG PAD ABDOMINAL 8X10 ST (GAUZE/BANDAGES/DRESSINGS) ×4 IMPLANT
DURAPREP 26ML APPLICATOR (WOUND CARE) ×2 IMPLANT
ELECT REM PT RETURN 15FT ADLT (MISCELLANEOUS) ×2 IMPLANT
GAUZE SPONGE 4X4 12PLY STRL (GAUZE/BANDAGES/DRESSINGS) ×2 IMPLANT
GAUZE XEROFORM 1X8 LF (GAUZE/BANDAGES/DRESSINGS) ×2 IMPLANT
GLOVE BIO SURGEON STRL SZ7.5 (GLOVE) ×2 IMPLANT
GLOVE BIOGEL PI IND STRL 6.5 (GLOVE) ×1 IMPLANT
GLOVE BIOGEL PI IND STRL 8 (GLOVE) ×1 IMPLANT
GLOVE BIOGEL PI INDICATOR 6.5 (GLOVE) ×1
GLOVE BIOGEL PI INDICATOR 8 (GLOVE) ×1
GLOVE SURG POLYISO LF SZ6.5 (GLOVE) ×2 IMPLANT
GOWN STRL REUS W/ TWL LRG LVL3 (GOWN DISPOSABLE) ×1 IMPLANT
GOWN STRL REUS W/ TWL XL LVL3 (GOWN DISPOSABLE) ×1 IMPLANT
GOWN STRL REUS W/TWL LRG LVL3 (GOWN DISPOSABLE) ×2
GOWN STRL REUS W/TWL XL LVL3 (GOWN DISPOSABLE) ×2
KIT BASIN OR (CUSTOM PROCEDURE TRAY) ×2 IMPLANT
KIT PUSHLOCK 2.9 HIP (KITS) IMPLANT
KIT TURNOVER KIT A (KITS) IMPLANT
LASSO 90 CVE QUICKPAS (DISPOSABLE) IMPLANT
LASSO CRESCENT QUICKPASS (SUTURE) IMPLANT
MANIFOLD NEPTUNE II (INSTRUMENTS) ×2 IMPLANT
NDL SCORPION MULTI FIRE (NEEDLE) IMPLANT
NEEDLE SCORPION MULTI FIRE (NEEDLE) ×2 IMPLANT
PACK ARTHROSCOPY WL (CUSTOM PROCEDURE TRAY) ×2 IMPLANT
PAD COLD SHLDR WRAP-ON (PAD) IMPLANT
PROBE BIPOLAR ATHRO 135MM 90D (MISCELLANEOUS) ×2 IMPLANT
PROTECTOR NERVE ULNAR (MISCELLANEOUS) ×2 IMPLANT
RESTRAINT HEAD UNIVERSAL NS (MISCELLANEOUS) IMPLANT
SLING ARM FOAM STRAP LRG (SOFTGOODS) ×1 IMPLANT
SLING ARM FOAM STRAP MED (SOFTGOODS) IMPLANT
SLING ARM IMMOBILIZER LRG (SOFTGOODS) IMPLANT
SLING ARM IMMOBILIZER MED (SOFTGOODS) IMPLANT
SUPPORT WRAP ARM LG (MISCELLANEOUS) ×2 IMPLANT
SUT ETHILON 3 0 PS 1 (SUTURE) ×3 IMPLANT
SUT PDS AB 1 CT1 27 (SUTURE) IMPLANT
SUT TIGER TAPE 7 IN WHITE (SUTURE) IMPLANT
SUTURE TAPE 1.3 40 TPR END (SUTURE) IMPLANT
SUTURETAPE 1.3 40 TPR END (SUTURE) ×4
TAPE CLOTH SURG 6X10 WHT LF (GAUZE/BANDAGES/DRESSINGS) ×1 IMPLANT
TAPE FIBER 2MM 7IN #2 BLUE (SUTURE) IMPLANT
TAPE LABRALWHITE 1.5X36 (TAPE) IMPLANT
TAPE SUT LABRALTAP WHT/BLK (SUTURE) IMPLANT
TOWEL OR 17X26 10 PK STRL BLUE (TOWEL DISPOSABLE) ×2 IMPLANT
TOWEL OR NON WOVEN STRL DISP B (DISPOSABLE) ×2 IMPLANT
TUBING ARTHROSCOPY IRRIG 16FT (MISCELLANEOUS) ×2 IMPLANT
TUBING CONNECTING 10 (TUBING) ×4 IMPLANT

## 2022-06-14 NOTE — Anesthesia Procedure Notes (Signed)
Anesthesia Regional Block: Interscalene brachial plexus block   Pre-Anesthetic Checklist: , timeout performed,  Correct Patient, Correct Site, Correct Laterality,  Correct Procedure, Correct Position, site marked,  Risks and benefits discussed,  Surgical consent,  Pre-op evaluation,  At surgeon's request and post-op pain management  Laterality: Right  Prep: Dura Prep       Needles:  Injection technique: Single-shot  Needle Type: Echogenic Stimulator Needle     Needle Length: 5cm  Needle Gauge: 20     Additional Needles:   Procedures:,,,, ultrasound used (permanent image in chart),,    Narrative:  Start time: 06/14/2022 10:07 AM End time: 06/14/2022 10:12 AM Injection made incrementally with aspirations every 5 mL.  Performed by: Personally  Anesthesiologist: Atilano Median, DO  Additional Notes: Patient identified. Risks/Benefits/Options discussed with patient including but not limited to bleeding, infection, nerve damage, failed block, incomplete pain control. Patient expressed understanding and wished to proceed. All questions were answered. Sterile technique was used throughout the entire procedure. Please see nursing notes for vital signs. Aspirated in 5cc intervals with injection for negative confirmation. Patient was given instructions on fall risk and not to get out of bed. All questions and concerns addressed with instructions to call with any issues or inadequate analgesia.

## 2022-06-14 NOTE — Op Note (Signed)
Procedure(s): SHOULDER ARTHROSCOPY WITH ROTATOR CUFF REPAIR, SUBACROMIAL DECOMPRESSION AND TENOTOMY Procedure Note  Majid Mccravy male 56 y.o. 06/14/2022  Preoperative diagnosis: #1 right shoulder rotator cuff tear #2 right shoulder impingement #3 right shoulder proximal biceps tendinopathy  Postoperative diagnosis: Same  Procedure(s) and Anesthesia Type:    * SHOULDER ARTHROSCOPY WITH ROTATOR CUFF REPAIR, SUBACROMIAL DECOMPRESSION AND TENOTOMY - Choice  Surgeon(s) and Role:    Jones Broom, MD - Primary     Surgeon: Glennon Hamilton   Assistants: Fredia Sorrow PA-C Amber was present and scrubbed throughout the procedure and was essential in positioning, assisting with the camera and instrumentation,, and closure)  Anesthesia: General endotracheal anesthesia with preoperative interscalene block given by the attending anesthesiologist   PROCEDURE: Arthroscopic subacromial decompression - 83419 Arthroscopic rotator cuff repair - 62229 Proximal long head biceps tenotomy 23405   OPERATIVE FINDING: Exam under anesthesia: Mild stiffness in forward flexion with satisfactory lysis of adhesion with gentle manipulation Articular space: Normal Chondral surfaces: Normal Biceps:  Significant proximal tenosynovitis with partial tearing Subscapularis: Intact  Supraspinatus: Complete tear Infraspinatus: Intact   Procedure Detail  SHOULDER ARTHROSCOPY WITH ROTATOR CUFF REPAIR, SUBACROMIAL DECOMPRESSION AND TENOTOMY  Estimated Blood Loss: Min         Drains: none  Blood Given: none         Specimens: none        Complications:  * No complications entered in OR log *         Disposition: PACU - hemodynamically stable.         Condition: stable    Procedure:   INDICATIONS FOR SURGERY: The patient is 56 y.o. male who has had a long history of right shoulder pain which is failed conservative management.  He was found on MRI to have a rotator cuff tear.  Indicated for  surgical treatment to try and decrease pain and restore function.   DESCRIPTION OF PROCEDURE: The patient was identified in preoperative  holding area where I personally marked the operative site after  verifying site, side, and procedure with the patient. An interscalene block was given by the attending anesthesiologist the holding area.  The patient was taken back to the operating room where general anesthesia was induced without complication and was placed in the beach-chair position with the back  elevated about 60 degrees and all extremities and head and neck carefully padded and  positioned.   The right upper extremity was then prepped and  draped in a standard sterile fashion. The appropriate time-out  procedure was carried out. The patient did receive IV antibiotics  within 30 minutes of incision.   A small posterior portal incision was made and the arthroscope was introduced into the joint. An anterior portal was then established above the subscapularis using needle localization. Small cannula was placed anteriorly. Diagnostic arthroscopy was then carried out with findings as described above.  Joint surfaces were noted to be intact.  Subscapularis was intact.  The biceps did have significant proximal tenosynovitis with partial tearing.  This was felt to be a source of part of his pain and as discussed preoperatively a proximal long head biceps tenotomy was carried out.  Through a small anterior incision Mayo scissors were used to release the tendon from the superior labrum.  The undersurface of the supraspinatus was found to have a complete tear which was small and nonretracted.  The infraspinatus was intact.  The arthroscope was then introduced into the subacromial space a standard lateral  portal was established with needle localization. The shaver was used through the lateral portal to perform extensive bursectomy. Coracoacromial ligament was examined and found to be frayed indicating  chronic impingement.  The bursal sided rotator cuff tear was identified.  A large cannula was placed laterally and the camera was moved to a posterolateral viewing portal.  The tuberosity was debrided down to a bleeding surface to promote healing and the repair was carried out with a 4.75 peek swivel lock anchor placed just off the articular margin preloaded with 2 suture tapes.  These 4 strands were passed throughout the tendon anterior to posterior and brought over to an additional 4.75 peek swivel lock anchor bringing the tendon down nicely over the prepared tuberosity.  The repair was complete and watertight.  The coracoacromial ligament was taken down off the anterior acromion with the ArthroCare exposing a moderate hooked anterior acromial spur. A high-speed bur was then used through the lateral portal to take down the anterior acromial spur from lateral to medial in a standard acromioplasty.  The acromioplasty was also viewed from the lateral portal and the bur was used as necessary to ensure that the acromion was completely flat from posterior to anterior.  The arthroscopic equipment was removed from the joint and the portals were closed with 3-0 nylon in an interrupted fashion. Sterile dressings were then applied including Xeroform 4 x 4's ABDs and tape. The patient was then allowed to awaken from general anesthesia, placed in a sling, transferred to the stretcher and taken to the recovery room in stable condition.   POSTOPERATIVE PLAN: The patient will be discharged home today and will followup in one week for suture removal and wound check.  He will follow the standard cuff protocol.

## 2022-06-14 NOTE — H&P (Signed)
Jason Floyd is an 56 y.o. male.   Chief Complaint: R shoulder pain  HPI: R shoulder high grade partial RCT, failed conservative treatment.  Past Medical History:  Diagnosis Date   AICD (automatic cardioverter/defibrillator) present    Allergy    Arthritis    Atrial fibrillation (HCC)    CHF (congestive heart failure) (HCC)    CHF (congestive heart failure) (HCC)    Diabetes mellitus without complication (HCC)    Drug abuse (HCC)    Eczema    GERD (gastroesophageal reflux disease)    Gout    Heart attack (HCC) 06/10/2020   History of kidney stones    Hyperlipidemia    Hypertension    MI (myocardial infarction) (HCC)    PE (pulmonary embolism)    TIA (transient ischemic attack)    hx of per pt - pt not aware he had not followed by a neurologist   Ventricular tachycardia [facility])     Past Surgical History:  Procedure Laterality Date   CERVICAL FUSION     ICD IMPLANT N/A 08/22/2020   Procedure: ICD IMPLANT;  Surgeon: Duke Salvia, MD;  Location: [facility] INVASIVE CV LAB;  Service: Cardiovascular;  Laterality: N/A;   KNEE ARTHROSCOPY Right 10/28/2020   Procedure: RIGHT KNEE ARTHROSCOPY, PARTIAL MEDIAL MENISCECTOMY, CHONDROPLASTY;  Surgeon: Marcene Corning, MD;  Location: [facility] ORS;  Service: Orthopedics;  Laterality: Right;   left thumb surgery      RIGHT/LEFT HEART CATH AND CORONARY ANGIOGRAPHY N/A 07/01/2020   Procedure: RIGHT/LEFT HEART CATH AND CORONARY ANGIOGRAPHY;  Surgeon: Dolores Patty, MD;  Location: [facility] INVASIVE CV LAB;  Service: Cardiovascular;  Laterality: N/A;   SHOULDER ARTHROSCOPY Left    TOTAL KNEE ARTHROPLASTY Right 10/03/2021   Procedure: RIGHT TOTAL KNEE ARTHROPLASTY;  Surgeon: Marcene Corning, MD;  Location: [facility] ORS;  Service: Orthopedics;  Laterality: Right;   VASECTOMY Bilateral    WISDOM TOOTH EXTRACTION      Family History  Problem Relation Age of Onset   Dementia Mother        frontal-temporal   Stroke Mother    Breast cancer Maternal Aunt    Other  Brother        overdose   Colon cancer Neg Hx    Social History:  reports that he has been smoking cigarettes. He has a 7.75 pack-year smoking history. He has never used smokeless tobacco. He reports current alcohol use. He reports that he does not currently use drugs after having used the following drugs: Marijuana.  Allergies: No Known Allergies  Medications Prior to Admission  Medication Sig Dispense Refill   Alirocumab (PRALUENT) 150 MG/ML SOAJ Inject 150 mg into the skin every 14 (fourteen) days.     allopurinol (ZYLOPRIM) 300 MG tablet Take 2 tablets (600 mg total) by mouth daily. 60 tablet 6   amiodarone (PACERONE) 200 MG tablet TAKE 1/2 TABLET (100 MG TOTAL) BY MOUTH DAILY. 45 tablet 3   aspirin EC 81 MG tablet Take 81 mg by mouth daily. Swallow whole.     atorvastatin (LIPITOR) 80 MG tablet TAKE 1 TABLET BY MOUTH DAILY. 30 tablet 11   calcium carbonate (TUMS - DOSED IN MG ELEMENTAL CALCIUM) 500 MG chewable tablet Chew 2-3 tablets by mouth daily as needed for indigestion or heartburn.     carvedilol (COREG) 12.5 MG tablet TAKE 1 AND 1/2 TABLETS BY MOUTH 2 TIMES DAILY WITH A MEAL. 270 tablet 2   cetirizine (ZYRTEC) 10 MG tablet Take 10 mg by mouth  daily.     cyclobenzaprine (FLEXERIL) 10 MG tablet Take 1 tablet (10 mg total) by mouth 3 (three) times daily as needed for muscle spasms. 30 tablet 0   ELIQUIS 5 MG TABS tablet TAKE 1 TABLET BY MOUTH 2 TIMES A DAY 60 tablet 11   ENTRESTO 49-51 MG TAKE 1 TABLET BY MOUTH 2 TIMES DAILY. 60 tablet 11   fenofibrate 160 MG tablet TAKE 1 TABLET BY MOUTH DAILY. 90 tablet 1   icosapent Ethyl (VASCEPA) 1 g capsule TAKE 2 CAPSULES BY MOUTH 2 TIMES DAILY. 360 capsule 3   levothyroxine (SYNTHROID) 50 MCG tablet TAKE 1 TABLET (50 MCG TOTAL) BY MOUTH DAILY 30 MINUTES BEFORE BREAKFAST AND ALL OTHER PILLS. 90 tablet 3   metFORMIN (GLUCOPHAGE) 1000 MG tablet TAKE 1 TABLET BY MOUTH 2 TIMES DAILY WITH A MEAL. 60 tablet 0   methocarbamol (ROBAXIN) 500 MG tablet  Take 1 tablet (500 mg total) by mouth every 6 (six) hours as needed for muscle spasms. 90 tablet 0   Naphazoline HCl (CLEAR EYES OP) Place 1 drop into both eyes daily as needed (sore eyes).     pantoprazole (PROTONIX) 40 MG tablet TAKE 1 TABLET BY MOUTH DAILY. 30 tablet 2   predniSONE (DELTASONE) 10 MG tablet 3 tabs x3 days and then 2 tabs x3 days and then 1 tab x3 days.  Take w/ food. 18 tablet 0   sodium chloride (OCEAN) 0.65 % SOLN nasal spray Place 1 spray into both nostrils as needed (dryness).     spironolactone (ALDACTONE) 25 MG tablet TAKE 1 TABLET (25 MG TOTAL) BY MOUTH DAILY. 30 tablet 11   traZODone (DESYREL) 100 MG tablet TAKE 1 TABLET BY MOUTH AT BEDTIME. 90 tablet 1   triamcinolone (KENALOG) 0.025 % ointment Apply 1 application. topically daily as needed (irritation). 80 g 3   Evolocumab (REPATHA SURECLICK) [id number] MG/ML SOAJ Inject 1 Pen into the skin every 14 (fourteen) days. 6 mL 3   FARXIGA 10 MG TABS tablet TAKE 1 TABLET BY MOUTH DAILY BEFORE BREAKFAST. 30 tablet 0    Results for orders placed or performed during the hospital encounter of 06/14/22 (from the past 48 hour(s))  Glucose, capillary     Status: Abnormal   Collection Time: 06/14/22  8:21 AM  Result Value Ref Range   Glucose-Capillary 182 (H) 70 - 99 mg/dL    Comment: Glucose reference range applies only to samples taken after fasting for at least 8 hours.   Comment 1 Notify RN    Comment 2 Document in Chart    No results found.  Review of Systems  All other systems reviewed and are negative.   Blood pressure 114/78, pulse 69, temperature 98.2 F (36.8 C), temperature source Oral, resp. rate 12, height 6\' 2"  (1.88 m), weight 105.2 kg, SpO2 97 %. Physical Exam Constitutional:      Appearance: He is well-developed.  HENT:     Head: Atraumatic.  Eyes:     Extraocular Movements: Extraocular movements intact.  Cardiovascular:     Pulses: Normal pulses.  Pulmonary:     Effort: Pulmonary effort is normal.   Musculoskeletal:     Comments: Right shoulder pain with impingement/RC testing. NVID  Skin:    General: Skin is warm and dry.  Neurological:     Mental Status: He is alert and oriented to person, place, and time.  Psychiatric:        Mood and Affect: Mood normal.  Assessment/Plan R shoulder high grade partial RCT, failed conservative treatment. Plan R arthr RCR, SAD, poss biceps tenotomy Risks / benefits of surgery discussed Consent on chart  NPO for OR Preop antibiotics   Glennon Hamilton, MD 06/14/2022, 10:05 AM

## 2022-06-14 NOTE — Anesthesia Procedure Notes (Signed)
Procedure Name: Intubation Date/Time: 06/14/2022 11:05 AM  Performed by: Jonna Munro, CRNAPre-anesthesia Checklist: Patient identified, Emergency Drugs available, Suction available, Patient being monitored and Timeout performed Patient Re-evaluated:Patient Re-evaluated prior to induction Oxygen Delivery Method: Circle system utilized Preoxygenation: Pre-oxygenation with 100% oxygen Induction Type: IV induction Ventilation: Mask ventilation without difficulty Laryngoscope Size: Mac and 4 Grade View: Grade I Tube type: Oral Tube size: 7.5 mm Number of attempts: 1 Airway Equipment and Method: Stylet Placement Confirmation: ETT inserted through vocal cords under direct vision, positive ETCO2, CO2 detector and breath sounds checked- equal and bilateral Secured at: 23 cm Tube secured with: Tape Dental Injury: Teeth and Oropharynx as per pre-operative assessment

## 2022-06-14 NOTE — Progress Notes (Signed)
Device Rep Paged

## 2022-06-14 NOTE — Discharge Instructions (Addendum)
Discharge Instructions after Arthroscopic Shoulder Repair   A sling has been provided for you. Remain in your sling at all times. This includes sleeping in your sling.  Use ice on the shoulder intermittently over the first 48 hours after surgery.  Pain medicine has been prescribed for you.  Use your medicine liberally over the first 48 hours, and then you can begin to taper your use. You may take Extra Strength Tylenol or Tylenol only in place of the pain pills. DO NOT take ANY nonsteroidal anti-inflammatory pain medications: Advil, Motrin, Ibuprofen, Aleve, Naproxen, or Narprosyn.  You may remove your dressing after two days. If the incision sites are still moist, place a Band-Aid over the moist site(s). Change Band-Aids daily until dry.  You may shower 5 days after surgery. The incisions CANNOT get wet prior to 5 days. Simply allow the water to wash over the site and then pat dry. Do not rub the incisions. Make sure your axilla (armpit) is completely dry after showering.  Resume Eliquis the day after surgery   Please call [phone] during normal business hours or [phone] after hours for any problems. Including the following:  - excessive redness of the incisions - drainage for more than 4 days - fever of more than 101.5 F  *Please note that pain medications will not be refilled after hours or on weekends.

## 2022-06-14 NOTE — Transfer of Care (Signed)
Immediate Anesthesia Transfer of Care Note  Patient: Jason Floyd  Procedure(s) Performed: SHOULDER ARTHROSCOPY WITH ROTATOR CUFF REPAIR, SUBACROMIAL DECOMPRESSION AND TENOTOMY (Right)  Patient Location: PACU  Anesthesia Type:General  Level of Consciousness: awake, alert , oriented and patient cooperative  Airway & Oxygen Therapy: Patient Spontanous Breathing and Patient connected to face mask oxygen  Post-op Assessment: Report given to RN, Post -op Vital signs reviewed and stable and Patient moving all extremities X 4  Post vital signs: Reviewed and stable  Last Vitals:  Vitals Value Taken Time  BP 110/74 06/14/22 1208  Temp    Pulse 62 06/14/22 1211  Resp 19 06/14/22 1210  SpO2 100 % 06/14/22 1211  Vitals shown include unvalidated device data.  Last Pain:  Vitals:   06/14/22 1000  TempSrc: Oral  PainSc: 0-No pain         Complications: No notable events documented.

## 2022-06-15 ENCOUNTER — Encounter (HOSPITAL_COMMUNITY): Payer: Self-pay | Admitting: Orthopedic Surgery

## 2022-06-24 NOTE — Anesthesia Postprocedure Evaluation (Signed)
Anesthesia Post Note  Patient: Jason Floyd  Procedure(s) Performed: SHOULDER ARTHROSCOPY WITH ROTATOR CUFF REPAIR, SUBACROMIAL DECOMPRESSION AND TENOTOMY (Right)     Patient location during evaluation: PACU Anesthesia Type: Regional and General Level of consciousness: awake and alert Pain management: pain level controlled Vital Signs Assessment: post-procedure vital signs reviewed and stable Respiratory status: spontaneous breathing, nonlabored ventilation, respiratory function stable and patient connected to nasal cannula oxygen Cardiovascular status: blood pressure returned to baseline and stable Postop Assessment: no apparent nausea or vomiting Anesthetic complications: no   No notable events documented.  Last Vitals:  Vitals:   06/14/22 1315 06/14/22 1330  BP: (!) 94/32 101/68  Pulse:  60  Resp: 20 20  Temp: 36.6 C 36.6 C  SpO2: 98% 96%    Last Pain:  Vitals:   06/14/22 1330  TempSrc: Oral  PainSc: 0-No pain                 Earl Lites P Vivianne Carles

## 2022-07-09 ENCOUNTER — Other Ambulatory Visit: Payer: Self-pay | Admitting: Family Medicine

## 2022-07-10 ENCOUNTER — Other Ambulatory Visit (HOSPITAL_COMMUNITY): Payer: Self-pay

## 2022-07-10 ENCOUNTER — Telehealth (HOSPITAL_COMMUNITY): Payer: Self-pay

## 2022-07-10 NOTE — Telephone Encounter (Signed)
Patient Advocate Encounter   Received notification that prior authorization is required for Entresto 49-51MG   Submitted: 07/10/2022 Key [id number]  Burnell Blanks, CPhT Rx Patient Advocate Phone: [phone]

## 2022-07-10 NOTE — Telephone Encounter (Signed)
Advanced Heart Failure Patient Advocate Encounter  Prior Authorization for Sherryll Burger has been approved.    PA# [id number] Effective dates: 07/10/22 through 07/11/23  Archer Asa, CPhT

## 2022-07-20 ENCOUNTER — Other Ambulatory Visit: Payer: Self-pay | Admitting: Family Medicine

## 2022-07-20 NOTE — Telephone Encounter (Signed)
Patient is requesting a refill of the following medications: Requested Prescriptions   Pending Prescriptions Disp Refills   cyclobenzaprine (FLEXERIL) 10 MG tablet [Pharmacy Med Name: CYCLOBENZAPRINE HCL 10 MG T 10 Tablet] 30 tablet 0    Sig: TAKE 1 TABLET BY MOUTH 3 TIMES DAILY AS NEEDED FOR MUSCLE SPASMS.   pantoprazole (PROTONIX) 40 MG tablet [Pharmacy Med Name: PANTOPRAZOLE SOD DR 40 MG T 40 Tablet] 30 tablet 2    Sig: TAKE 1 TABLET BY MOUTH DAILY.    Date of patient request: 07/20/2022 Last office visit: 09/15/2021 Date of last refill: 07/09/2022 Last refill amount: 30 tablets  Follow up time period per chart: 07/25/2022

## 2022-07-21 NOTE — Telephone Encounter (Signed)
Prior notes reviewed.  Flexeril, Protonix refilled temporarily until planned follow-up with primary care provider on September 13.

## 2022-07-25 ENCOUNTER — Other Ambulatory Visit: Payer: Self-pay

## 2022-07-25 ENCOUNTER — Ambulatory Visit (INDEPENDENT_AMBULATORY_CARE_PROVIDER_SITE_OTHER): Payer: BC Managed Care – PPO | Admitting: Family Medicine

## 2022-07-25 ENCOUNTER — Ambulatory Visit: Payer: BC Managed Care – PPO | Admitting: Family Medicine

## 2022-07-25 ENCOUNTER — Encounter: Payer: Self-pay | Admitting: Family Medicine

## 2022-07-25 VITALS — BP 108/70 | HR 76 | Temp 97.8°F | Resp 18 | Ht 74.0 in | Wt 234.2 lb

## 2022-07-25 DIAGNOSIS — E039 Hypothyroidism, unspecified: Secondary | ICD-10-CM | POA: Diagnosis not present

## 2022-07-25 DIAGNOSIS — E785 Hyperlipidemia, unspecified: Secondary | ICD-10-CM | POA: Diagnosis not present

## 2022-07-25 DIAGNOSIS — E118 Type 2 diabetes mellitus with unspecified complications: Secondary | ICD-10-CM

## 2022-07-25 LAB — BASIC METABOLIC PANEL
BUN: 20 mg/dL (ref 6–23)
CO2: 27 mEq/L (ref 19–32)
Calcium: 10.3 mg/dL (ref 8.4–10.5)
Chloride: 99 mEq/L (ref 96–112)
Creatinine, Ser: 1.18 mg/dL (ref 0.40–1.50)
GFR: 68.38 mL/min (ref >60.00)
Glucose, Bld: 121 mg/dL — ABNORMAL HIGH (ref 70–99)
Potassium: 4.6 mEq/L (ref 3.5–5.1)
Sodium: 136 mEq/L (ref 135–145)

## 2022-07-25 LAB — HEPATIC FUNCTION PANEL
ALT: 29 U/L (ref 0–53)
AST: 19 U/L (ref 0–37)
Albumin: 4.7 g/dL (ref 3.5–5.2)
Alkaline Phosphatase: 48 U/L (ref 39–117)
Bilirubin, Direct: 0.2 mg/dL (ref 0.0–0.3)
Total Bilirubin: 0.6 mg/dL (ref 0.2–1.2)
Total Protein: 7.9 g/dL (ref 6.0–8.3)

## 2022-07-25 LAB — CBC WITH DIFFERENTIAL/PLATELET
Basophils Absolute: 0.1 10*3/uL (ref 0.0–0.1)
Basophils Relative: 0.6 % (ref 0.0–3.0)
Eosinophils Absolute: 0.2 10*3/uL (ref 0.0–0.7)
Eosinophils Relative: 2.1 % (ref 0.0–5.0)
HCT: 48.8 % (ref 39.0–52.0)
Hemoglobin: 16.3 g/dL (ref 13.0–17.0)
Lymphocytes Relative: 35.9 % (ref 12.0–46.0)
Lymphs Abs: 4.1 10*3/uL — ABNORMAL HIGH (ref 0.7–4.0)
MCHC: 33.3 g/dL (ref 30.0–36.0)
MCV: 92.8 fl (ref 78.0–100.0)
Monocytes Absolute: 0.7 10*3/uL (ref 0.1–1.0)
Monocytes Relative: 6.1 % (ref 3.0–12.0)
Neutro Abs: 6.3 10*3/uL (ref 1.4–7.7)
Neutrophils Relative %: 55.3 % (ref 43.0–77.0)
Platelets: 277 10*3/uL (ref 150.0–400.0)
RBC: 5.26 Mil/uL (ref 4.22–5.81)
RDW: 13.5 % (ref 11.5–15.5)
WBC: 11.3 10*3/uL — ABNORMAL HIGH (ref 4.0–10.5)

## 2022-07-25 LAB — TSH: TSH: 2.32 u[IU]/mL (ref 0.35–5.50)

## 2022-07-25 LAB — LIPID PANEL
Cholesterol: 87 mg/dL (ref 0–200)
HDL: 45.4 mg/dL (ref >39.00)
LDL Cholesterol: 13 mg/dL (ref 0–99)
NonHDL: 41.6
Total CHOL/HDL Ratio: 2
Triglycerides: 142 mg/dL (ref 0.0–149.0)
VLDL: 28.4 mg/dL (ref 0.0–40.0)

## 2022-07-25 LAB — MICROALBUMIN / CREATININE URINE RATIO
Creatinine,U: 22.7 mg/dL
Microalb Creat Ratio: 3.1 mg/g (ref 0.0–30.0)
Microalb, Ur: <0.7 mg/dL (ref 0.0–1.9)

## 2022-07-25 NOTE — Progress Notes (Signed)
   Subjective:    Patient ID: Jason Floyd, male    DOB: [birth year], 56 y.o.   MRN: [id number]  HPI DM- A1C 7% on 7/27.  UTD on foot exam, eye exam.  Due for microalbumin.  Currently on Metformin 1000mg  BID.  No HAs, visual changes.  No numbness/tingling of hands/feet.  Rare symptomatic lows that resolve w/ eating.  Hyperlipidemia- chronic problem, on Lipitor 80mg  daily, Repatha 140mg , Fenofibrate 160mg , Vascepa 2gm BID.  No CP, SOB, abd pain, N/V.  Hypothyroid- chronic problem, on Levothyroxine daily.  Denies changes in energy level.  No changes to skin/hair/nails.   Review of Systems For ROS see HPI     Objective:   Physical Exam Vitals reviewed.  Constitutional:      General: He is not in acute distress.    Appearance: Normal appearance. He is well-developed. He is not ill-appearing.  HENT:     Head: Normocephalic and atraumatic.  Eyes:     Extraocular Movements: Extraocular movements intact.     Conjunctiva/sclera: Conjunctivae normal.     Pupils: Pupils are equal, round, and reactive to light.  Neck:     Thyroid: No thyromegaly.  Cardiovascular:     Rate and Rhythm: Normal rate and regular rhythm.     Pulses: Normal pulses.     Heart sounds: Normal heart sounds. No murmur heard. Pulmonary:     Effort: Pulmonary effort is normal. No respiratory distress.     Breath sounds: Normal breath sounds.  Abdominal:     General: Bowel sounds are normal. There is no distension.     Palpations: Abdomen is soft.  Musculoskeletal:     Cervical back: Normal range of motion and neck supple.     Right lower leg: No edema.     Left lower leg: No edema.  Lymphadenopathy:     Cervical: No cervical adenopathy.  Skin:    General: Skin is warm and dry.  Neurological:     General: No focal deficit present.     Mental Status: He is alert and oriented to person, place, and time.     Cranial Nerves: No cranial nerve deficit.  Psychiatric:        Mood and Affect: Mood normal.         Behavior: Behavior normal.           Assessment & Plan:

## 2022-07-25 NOTE — Patient Instructions (Addendum)
Schedule your complete physical in 6 months We'll notify you of your lab results and make any changes if needed Continue to work on healthy diet and regular exercise- you can do it!!! Call with any questions or concerns Stay Safe!  Stay Healthy! Safe Travels!!!

## 2022-07-26 ENCOUNTER — Other Ambulatory Visit (HOSPITAL_COMMUNITY): Payer: Self-pay

## 2022-07-26 NOTE — Assessment & Plan Note (Signed)
New dx for pt.  Currently on Levothyroxine daily w/o difficulty.  Asymptomatic at this time.  Check labs.  Adjust meds prn

## 2022-07-26 NOTE — Assessment & Plan Note (Signed)
A1C on 7/27= 7%.  UTD on foot exam, eye exam.  Will get microalbumin today.  Currently tolerating Metformin 1000mg  BID w/o difficulty.  Asymptomatic.  No med changes at this time.

## 2022-07-26 NOTE — Assessment & Plan Note (Signed)
Chronic problem.  Currently on Lipitor 80mg  daily, Repatha 140mg , Fenofibrate 160mg  daily, and Vascepa 2gm BID.  Tolerating meds w/o difficulty.  Check labs.  Adjust meds prn

## 2022-07-28 ENCOUNTER — Other Ambulatory Visit: Payer: Self-pay | Admitting: Family Medicine

## 2022-07-30 ENCOUNTER — Other Ambulatory Visit (HOSPITAL_COMMUNITY): Payer: Self-pay

## 2022-07-30 MED ORDER — CARVEDILOL 12.5 MG PO TABS: ORAL_TABLET | ORAL | 3 refills | Status: DC

## 2022-07-30 NOTE — Telephone Encounter (Signed)
Meds ordered this encounter  Medications   carvedilol (COREG) 12.5 MG tablet    Sig: TAKE 1 AND 1/2 TABLETS BY MOUTH 2 TIMES DAILY WITH A MEAL.    Dispense:  270 tablet    Refill:  3

## 2022-08-07 ENCOUNTER — Other Ambulatory Visit: Payer: Self-pay | Admitting: Family Medicine

## 2022-08-18 ENCOUNTER — Other Ambulatory Visit: Payer: Self-pay | Admitting: Family Medicine

## 2022-08-20 ENCOUNTER — Encounter: Payer: Self-pay | Admitting: Family Medicine

## 2022-08-23 ENCOUNTER — Ambulatory Visit (INDEPENDENT_AMBULATORY_CARE_PROVIDER_SITE_OTHER): Payer: BC Managed Care – PPO

## 2022-08-23 DIAGNOSIS — I255 Ischemic cardiomyopathy: Secondary | ICD-10-CM

## 2022-08-23 LAB — CUP PACEART REMOTE DEVICE CHECK
Battery Remaining Longevity: 150 mo
Battery Remaining Percentage: 100 %
Brady Statistic RA Percent Paced: 0 %
Brady Statistic RV Percent Paced: 0 %
Date Time Interrogation Session: 20231012004100
HighPow Impedance: 85 Ohm
Implantable Lead Implant Date: 20211011
Implantable Lead Implant Date: 20211011
Implantable Lead Location: 753859
Implantable Lead Location: 753860
Implantable Lead Model: 138
Implantable Lead Model: 7841
Implantable Lead Serial Number: 1097338
Implantable Lead Serial Number: 303261
Implantable Pulse Generator Implant Date: 20211011
Lead Channel Impedance Value: 647 Ohm
Lead Channel Impedance Value: 707 Ohm
Lead Channel Setting Pacing Amplitude: 2.5 V
Lead Channel Setting Pacing Pulse Width: 0.4 ms
Lead Channel Setting Sensing Sensitivity: 0.5 mV
Pulse Gen Serial Number: 228262

## 2022-08-30 NOTE — Progress Notes (Signed)
Remote ICD transmission.   

## 2022-09-04 ENCOUNTER — Other Ambulatory Visit: Payer: Self-pay | Admitting: Family Medicine

## 2022-09-04 ENCOUNTER — Other Ambulatory Visit (HOSPITAL_COMMUNITY): Payer: Self-pay | Admitting: Internal Medicine

## 2022-09-18 ENCOUNTER — Other Ambulatory Visit: Payer: Self-pay | Admitting: Family Medicine

## 2022-09-18 NOTE — Telephone Encounter (Signed)
Patient is requesting a refill of the following medications: Requested Prescriptions   Pending Prescriptions Disp Refills   cyclobenzaprine (FLEXERIL) 10 MG tablet [Pharmacy Med Name: CYCLOBENZAPRINE HCL 10 MG T 10 Tablet] 30 tablet 0    Sig: TAKE 1 TABLET BY MOUTH 3 TIMES DAILY AS NEEDED FOR MUSCLE SPASMS.    Date of patient request: 09/18/2022 Last office visit: 07/25/2022 Date of last refill: 09/04/2022 Last refill amount: 30 tab

## 2022-09-19 ENCOUNTER — Encounter: Payer: Self-pay | Admitting: Family Medicine

## 2022-09-19 LAB — HM DIABETES EYE EXAM

## 2022-10-09 ENCOUNTER — Other Ambulatory Visit: Payer: Self-pay | Admitting: Family Medicine

## 2022-10-17 ENCOUNTER — Other Ambulatory Visit: Payer: Self-pay | Admitting: Family Medicine

## 2022-10-25 ENCOUNTER — Other Ambulatory Visit: Payer: Self-pay | Admitting: Family Medicine

## 2022-10-29 ENCOUNTER — Other Ambulatory Visit: Payer: Self-pay | Admitting: Family Medicine

## 2022-10-31 ENCOUNTER — Telehealth (HOSPITAL_COMMUNITY): Payer: Self-pay | Admitting: Vascular Surgery

## 2022-10-31 NOTE — Telephone Encounter (Signed)
Lvm to move 12/27 appt to 1st ava db or np

## 2022-11-07 ENCOUNTER — Encounter (HOSPITAL_COMMUNITY): Payer: BC Managed Care – PPO | Admitting: Internal Medicine

## 2022-11-08 ENCOUNTER — Other Ambulatory Visit: Payer: Self-pay | Admitting: Family Medicine

## 2022-11-15 ENCOUNTER — Other Ambulatory Visit: Payer: Self-pay | Admitting: Family Medicine

## 2022-11-20 ENCOUNTER — Other Ambulatory Visit: Payer: Self-pay | Admitting: Family Medicine

## 2022-11-21 ENCOUNTER — Encounter: Payer: Self-pay | Admitting: Family Medicine

## 2022-11-21 MED ORDER — TRAZODONE HCL 100 MG PO TABS: 100.0000 mg | ORAL_TABLET | Freq: Every day | ORAL | 1 refills | Status: DC

## 2022-11-22 ENCOUNTER — Ambulatory Visit (INDEPENDENT_AMBULATORY_CARE_PROVIDER_SITE_OTHER): Payer: BC Managed Care – PPO

## 2022-11-22 DIAGNOSIS — I255 Ischemic cardiomyopathy: Secondary | ICD-10-CM | POA: Diagnosis not present

## 2022-11-22 LAB — CUP PACEART REMOTE DEVICE CHECK
Battery Remaining Longevity: 144 mo
Battery Remaining Percentage: 100 %
Brady Statistic RA Percent Paced: 0 %
Brady Statistic RV Percent Paced: 0 %
Date Time Interrogation Session: 20240111010700
HighPow Impedance: 91 Ohm
Implantable Lead Connection Status: 753985
Implantable Lead Connection Status: 753985
Implantable Lead Implant Date: 20211011
Implantable Lead Implant Date: 20211011
Implantable Lead Location: 753859
Implantable Lead Location: 753860
Implantable Lead Model: 138
Implantable Lead Model: 7841
Implantable Lead Serial Number: 1097338
Implantable Lead Serial Number: 303261
Implantable Pulse Generator Implant Date: 20211011
Lead Channel Impedance Value: 662 Ohm
Lead Channel Impedance Value: 742 Ohm
Lead Channel Setting Pacing Amplitude: 2.5 V
Lead Channel Setting Pacing Pulse Width: 0.4 ms
Lead Channel Setting Sensing Sensitivity: 0.5 mV
Pulse Gen Serial Number: 228262
Zone Setting Status: 755011

## 2022-12-04 ENCOUNTER — Telehealth (HOSPITAL_COMMUNITY): Payer: Self-pay

## 2022-12-04 NOTE — Telephone Encounter (Signed)
Left message to see if he can change the time of his apt on Thursday  to 11.40am per DB

## 2022-12-06 ENCOUNTER — Ambulatory Visit (HOSPITAL_COMMUNITY)
Admission: RE | Admit: 2022-12-06 | Discharge: 2022-12-06 | Disposition: A | Discharge status: Home / Self Care | Payer: BC Managed Care – PPO | Source: Ambulatory Visit | Attending: Internal Medicine | Admitting: Internal Medicine

## 2022-12-06 VITALS — BP 106/70 | HR 68 | Wt 241.0 lb

## 2022-12-06 DIAGNOSIS — I428 Other cardiomyopathies: Secondary | ICD-10-CM | POA: Insufficient documentation

## 2022-12-06 DIAGNOSIS — Z7984 Long term (current) use of oral hypoglycemic drugs: Secondary | ICD-10-CM | POA: Diagnosis not present

## 2022-12-06 DIAGNOSIS — Z79890 Hormone replacement therapy (postmenopausal): Secondary | ICD-10-CM | POA: Diagnosis not present

## 2022-12-06 DIAGNOSIS — Z716 Tobacco abuse counseling: Secondary | ICD-10-CM | POA: Diagnosis not present

## 2022-12-06 DIAGNOSIS — E039 Hypothyroidism, unspecified: Secondary | ICD-10-CM | POA: Diagnosis not present

## 2022-12-06 DIAGNOSIS — I4819 Other persistent atrial fibrillation: Secondary | ICD-10-CM | POA: Diagnosis not present

## 2022-12-06 DIAGNOSIS — Z7901 Long term (current) use of anticoagulants: Secondary | ICD-10-CM | POA: Diagnosis not present

## 2022-12-06 DIAGNOSIS — E119 Type 2 diabetes mellitus without complications: Secondary | ICD-10-CM | POA: Diagnosis not present

## 2022-12-06 DIAGNOSIS — Z720 Tobacco use: Secondary | ICD-10-CM

## 2022-12-06 DIAGNOSIS — Z7982 Long term (current) use of aspirin: Secondary | ICD-10-CM | POA: Insufficient documentation

## 2022-12-06 DIAGNOSIS — F17210 Nicotine dependence, cigarettes, uncomplicated: Secondary | ICD-10-CM | POA: Diagnosis not present

## 2022-12-06 DIAGNOSIS — I2510 Atherosclerotic heart disease of native coronary artery without angina pectoris: Secondary | ICD-10-CM

## 2022-12-06 DIAGNOSIS — Z95810 Presence of automatic (implantable) cardiac defibrillator: Secondary | ICD-10-CM

## 2022-12-06 DIAGNOSIS — I5042 Chronic combined systolic (congestive) and diastolic (congestive) heart failure: Secondary | ICD-10-CM | POA: Diagnosis not present

## 2022-12-06 DIAGNOSIS — I472 Ventricular tachycardia, unspecified: Secondary | ICD-10-CM

## 2022-12-06 DIAGNOSIS — I110 Hypertensive heart disease with heart failure: Secondary | ICD-10-CM | POA: Insufficient documentation

## 2022-12-06 DIAGNOSIS — I480 Paroxysmal atrial fibrillation: Secondary | ICD-10-CM

## 2022-12-06 DIAGNOSIS — Z79899 Other long term (current) drug therapy: Secondary | ICD-10-CM | POA: Insufficient documentation

## 2022-12-06 NOTE — Progress Notes (Signed)
ADVANCED HF CLINIC NOTE PCP: Dr. Neena Rhymes HF: Dr. [name] EP: Dr. Graciela Husbands   HPI:  Jason Floyd is 57 year old with a PMH of severe systolic heart failure due to NICM, DM2, former polysubstance and tobacco abuse.    Echo 2008 EF 15% in setting of polysubstance abuse.   ECHO 12/2007 EF 55-60%  Echo 10/20 EF 55-60% no RWMA.   On 06/07/20 had TIA-like symptoms with mild aphasia and weakness (dropped a can with left hand). Went to PCP had carotid u/s and echo. Carotid u/s 06/22/20 1-39% bilaterally. Echo 8/21 EF down to 25-30%. Brain MRI/MRA. Normal vasculature.  Left corona radiata diffusion-weighted/FLAIR hyperintensity is nonspecific. Differential includes active demyelinating lesionversus subacute insult.  Underwent R/L cath 07/01/20 LAD 100% prox LCX 20% RCA 10%  EF 25-30% Ao = 112/72 (90) LV = 111/27 RA = 2 RV = 46/7 PA = 44/12 (27) PCW = 16 Fick cardiac output/index = 6.8/2.9 PVR = 1.5 WU SVR 1047 Ao sat = 96% PA sat = 70%, 78% SVC sat = 74%   After cath underwent cMRI.for viability  - LVEF 26% - LGE suggestive of a very large, wrap-around LAD infarction with extensive no-reflow affecting the subendocardium.   Zio monitored placed for palpitations in 10/21 and found to have VT. Underwent ICD placement by Dr. Graciela Husbands on 08/22/20. Was also found to have volume overload and AF.   Here for routine f/u. Feels good. No CP or SOB. Active with his dogs. This past Friday was very dizzy, unstable on his feet. Room was spinning. No n/v. Felt to have vertigo. Talked to his friend who is a PT and taught him the home Epley maneuver which fixed the problem. No edema, orthopnea or PND. Compliant with meds.    ICD interrogated HL 6, No VT/VF/AT. Volume minimally elevated. Activity level average 1.2 hr/Day Volume ok Personally reviewed    Past Medical History:  Diagnosis Date   AICD (automatic cardioverter/defibrillator) present    Allergy    Arthritis    Atrial  fibrillation (HCC)    CHF (congestive heart failure) (HCC)    CHF (congestive heart failure) (HCC)    Diabetes mellitus without complication (HCC)    Drug abuse (HCC)    Eczema    GERD (gastroesophageal reflux disease)    Gout    Heart attack (HCC) 06/10/2020   History of kidney stones    Hyperlipidemia    Hypertension    MI (myocardial infarction) (HCC)    PE (pulmonary embolism)    TIA (transient ischemic attack)    hx of per pt - pt not aware he had not followed by a neurologist   Ventricular tachycardia (HCC)     Current Outpatient Medications  Medication Sig Dispense Refill   allopurinol (ZYLOPRIM) 300 MG tablet Take 2 tablets (600 mg total) by mouth daily. 60 tablet 6   amiodarone (PACERONE) 200 MG tablet TAKE 1/2 TABLET (100 MG TOTAL) BY MOUTH DAILY. 45 tablet 3   aspirin EC 81 MG tablet Take 81 mg by mouth daily. Swallow whole.     calcium carbonate (TUMS - DOSED IN MG ELEMENTAL CALCIUM) 500 MG chewable tablet Chew 2-3 tablets by mouth daily as needed for indigestion or heartburn.     carvedilol (COREG) 12.5 MG tablet TAKE 1 AND 1/2 TABLETS BY MOUTH 2 TIMES DAILY WITH A MEAL. 270 tablet 3   cetirizine (ZYRTEC) 10 MG tablet Take 10 mg by mouth daily.     cyclobenzaprine (FLEXERIL)  10 MG tablet TAKE 1 TABLET BY MOUTH 3 TIMES DAILY AS NEEDED FOR MUSCLE SPASMS. 30 tablet 0   ELIQUIS 5 MG TABS tablet TAKE 1 TABLET BY MOUTH 2 TIMES A DAY 60 tablet 11   ENTRESTO 49-51 MG TAKE 1 TABLET BY MOUTH 2 TIMES DAILY. 60 tablet 11   FARXIGA 10 MG TABS tablet TAKE 1 TABLET BY MOUTH DAILY BEFORE BREAKFAST. 30 tablet 0   fenofibrate 160 MG tablet TAKE 1 TABLET BY MOUTH DAILY. 90 tablet 1   levothyroxine (SYNTHROID) 50 MCG tablet TAKE 1 TABLET (50 MCG TOTAL) BY MOUTH DAILY 30 MINUTES BEFORE BREAKFAST AND ALL OTHER PILLS. 90 tablet 3   metFORMIN (GLUCOPHAGE) 1000 MG tablet TAKE 1 TABLET BY MOUTH 2 TIMES DAILY WITH A MEAL. 60 tablet 0   methocarbamol (ROBAXIN) 500 MG tablet TAKE 1 TABLET BY MOUTH  EVERY 6 HOURS AS NEEDED FOR MUSCLE SPASMS. 90 tablet 0   Naphazoline HCl (CLEAR EYES OP) Place 1 drop into both eyes daily as needed (sore eyes).     pantoprazole (PROTONIX) 40 MG tablet TAKE 1 TABLET BY MOUTH DAILY. 30 tablet 2   sodium chloride (OCEAN) 0.65 % SOLN nasal spray Place 1 spray into both nostrils as needed (dryness).     spironolactone (ALDACTONE) 25 MG tablet TAKE 1 TABLET (25 MG TOTAL) BY MOUTH DAILY. 30 tablet 11   traZODone (DESYREL) 100 MG tablet Take 1 tablet (100 mg total) by mouth at bedtime. 90 tablet 1   triamcinolone (KENALOG) 0.025 % ointment Apply 1 application. topically daily as needed (irritation). 80 g 3   oxyCODONE (ROXICODONE) 5 MG immediate release tablet Take 1 tablet (5 mg total) by mouth every 4 (four) hours as needed. (Patient not taking: Reported on 12/06/2022) 30 tablet 0   predniSONE (DELTASONE) 10 MG tablet 3 tabs x3 days and then 2 tabs x3 days and then 1 tab x3 days.  Take w/ food. (Patient not taking: Reported on 12/06/2022) 18 tablet 0   No current facility-administered medications for this encounter.    No Known Allergies    Social History   Socioeconomic History   Marital status: Married    Spouse name: Almyra Free   Number of children: Not on file   Years of education: Not on file   Highest education level: Bachelor's degree (e.g., BA, AB, BS)  Occupational History   Not on file  Tobacco Use   Smoking status: Some Days    Packs/day: 0.25    Years: 31.00    Total pack years: 7.75    Types: Cigarettes   Smokeless tobacco: Never   Tobacco comments:    Stopped a few weeks ago  Vaping Use   Vaping Use: Never used  Substance and Sexual Activity   Alcohol use: Yes    Comment: once a month   Drug use: Not Currently    Types: Marijuana    Comment: gummies every days   Sexual activity: Not on file  Other Topics Concern   Not on file  Social History Narrative   Lives with wife   Caffeine- coffee 4 c daily   Social Determinants of Health    Financial Resource Strain: Not on file  Food Insecurity: Not on file  Transportation Needs: Not on file  Physical Activity: Not on file  Stress: Not on file  Social Connections: Not on file  Intimate Partner Violence: Not on file      Family History  Problem Relation Age of Onset  Dementia Mother        frontal-temporal   Stroke Mother    Breast cancer Maternal Aunt    Other Brother        overdose   Colon cancer Neg Hx     Vitals:   12/06/22 1145  BP: 106/70  Pulse: 68  SpO2: 98%  Weight: 109.3 kg (241 lb)     PHYSICAL EXAM: General:  Well appearing. No resp difficulty HEENT: normal Neck: supple. no JVD. Carotids 2+ bilat; no bruits. No lymphadenopathy or thryomegaly appreciated. Cor: PMI nondisplaced. Regular rate & rhythm. No rubs, gallops or murmurs. Lungs: clear Abdomen: soft, nontender, nondistended. No hepatosplenomegaly. No bruits or masses. Good bowel sounds. Extremities: no cyanosis, clubbing, rash, edema Neuro: alert & orientedx3, cranial nerves grossly intact. moves all 4 extremities w/o difficulty. Affect pleasant   ASSESSMENT & PLAN:  Chronic Systolic HF due to iCM - Echo 2008 EF 15% in setting of polysubstance abuse.  - ECHO 12/2007 EF 55-60% - Echo 10/20 EF 55-60% no RWMA.  - Echo 8/21 EF 25-30% - Cath 8/21 LAD 100% LCx 20% RCA 10% - cMRI 08/02/20 EF 26% Large anterior infarct - ICD placed 10/21 for VT - Echo 10/17/20 EF 30-35% - Echo 6/22 EF 40-45%  - Echo 05/10/22 EF 40-45% (read as 35-40%)  - Doing very well. NYHA I-II - Continue carvedilol 18.75 bid - Continue Entresto 49/51 bid (previously cut back due to low BP) - Continue Farxiga 10 mg daily. - Continue spiro 25 daily - Recent labs ok  - ICD interrogated HL 6, No VT/VF/AT. Volume minimally elevated. Activity level average 1.2 hr/Day Volume ok Personally reviewed  2. CAD - cath as above with subacute LAD infarct and CTO LAD - no s/s angina - continue ASA, statin.  - Recent LDL  was 13 in 9/23. Followed by Lipid clinic  3. VT - s/p ICD 10/21 - quiescent on amio Last visit decreased to 100mg  daily.  He is followed by Dr. Caryl Comes last seen 03/2021 plan is to continue amio for 9 months and if no VT or afib he will discontinue. No VT on device today. Will stop amio and follow closely.  - Keep K. 4.0 Mg > 2.0  4. Persistent AF - In NSR here. No AF on ICD - As above. Stop amio  - Continue Eliquis 5 mg bid. No bleeding  5. Tobacco use -  still working on quitting.Smoking ~2cig/day - Discussed need for cessation   6. DM2 - continue SGLT2i, metformin  - followed by PCP. Have suggested starting GLP1-RA  7. Hx Possible TIA - carotid dopplers ok - Brain MRI/MRA. Vasculature ok. Findings suspicious for cardio-embolism.   8. Hypothyroidism - likely due to amio - PCP managing synthroid - Recheck TFs in 6 months as we are stopping amio   Glori Bickers, MD  12:22 PM

## 2022-12-06 NOTE — Patient Instructions (Addendum)
Stop amiodarone.  Your physician has requested that you have an echocardiogram. Echocardiography is a painless test that uses sound waves to create images of your heart. It provides your doctor with information about the size and shape of your heart and how well your heart's chambers and valves are working. This procedure takes approximately one hour. There are no restrictions for this procedure. Please do NOT wear cologne, perfume, aftershave, or lotions (deodorant is allowed). Please arrive 15 minutes prior to your appointment time.  Your physician recommends that you schedule a follow-up appointment in: 6 months with an echocardiogram ( July 2024)  ** please call the office in May to arrange your follow up appointment. **  If you have any questions or concerns before your next appointment please send [place] a message through [place] or call our office at [phone].    TO LEAVE A MESSAGE FOR THE NURSE SELECT OPTION 2, PLEASE LEAVE A MESSAGE INCLUDING: YOUR NAME DATE OF BIRTH CALL BACK NUMBER REASON FOR CALL**this is important as we prioritize the call backs  YOU WILL RECEIVE A CALL BACK THE SAME DAY AS LONG AS YOU CALL BEFORE 4:00 PM  At the [facility], you and your health needs are our priority. As part of our continuing mission to provide you with exceptional heart care, we have created designated Provider Care Teams. These Care Teams include your primary Cardiologist (physician) and Advanced Practice Providers (APPs- Physician Assistants and Nurse Practitioners) who all work together to provide you with the care you need, when you need it.   You may see any of the following providers on your designated Care Team at your next follow up: Dr Glori Bickers Dr Loralie Champagne Dr. Roxana Hires, NP Lyda Jester, [place] [facility] [place], [place] Forestine Na, NP Audry Riles, PharmD   Please be sure to bring in all your medications bottles to  every appointment.

## 2022-12-07 ENCOUNTER — Other Ambulatory Visit: Payer: Self-pay | Admitting: Family Medicine

## 2022-12-12 NOTE — Progress Notes (Signed)
Remote ICD transmission.   

## 2022-12-31 ENCOUNTER — Telehealth: Payer: Self-pay

## 2022-12-31 NOTE — Telephone Encounter (Signed)
Device alert - Antitachycardia pacing (ATP) therapy delivered to convert arrhythmia. Event occurred 2/16 @ 22:57, EGM shows abrupt onset of sustained VT, rate 220, ATP delivered x1 converting to regular AS/VS. 1 brief NSVT event @ 22:59, no therapy Route to triage LA  Patient states that he had "too many cocktails" that night and remembers stumbling down a few stairs but doesn't recall losing consciousness.  He just thought the stumble was because of his drinking which he states he doesn't usually do.  No symptoms at present.  Denies missing any doses of medications and no other changes.  Appt made to see A.Tillery, PA-C tomorrow 01/01/23 at 1140am to follow up.  Patient was made aware of [place] DMV driving law that he is not to drive for 26mhs from date of VT and received therapy event. Patient verbalizes understanding.

## 2022-12-31 NOTE — Progress Notes (Unsigned)
Electrophysiology Office Note Date: 01/01/2023  ID:  Jason Floyd, DOB April 20, 1966, MRN [id number]  PCP: Midge Minium, MD Primary Cardiologist: None Electrophysiologist: Virl Axe, MD   CC: Routine ICD follow-up  Jason Floyd is a 57 y.o. male seen today for Virl Axe, MD for acute visit due to appropriate ICD therapy (ATP for VT ~ 220 bpm).    Patient reports doing well overall. This weekend he had his annual get together with his group of friends, and he states he was "quite inebriated" all weekend. Prior to this he was his USOH. He denies missing any of his medications. Typically, he is a "special occasion" drinker only 1-2 "per month". He remembers stumbling at one point and feeling lightheadedness, but denies syncope, CP, or SOB.  Device History: Engineer, agricultural ICD implanted 08/2020 for VT History of appropriate therapy: Yes History of AAD therapy: Yes; previously tolerated amiodarone    Past Medical History:  Diagnosis Date   AICD (automatic cardioverter/defibrillator) present    Allergy    Arthritis    Atrial fibrillation (HCC)    CHF (congestive heart failure) (HCC)    CHF (congestive heart failure) (HCC)    Diabetes mellitus without complication ([place])    Drug abuse ([place])    Eczema    GERD (gastroesophageal reflux disease)    Gout    Heart attack ([place]) 06/10/2020   History of kidney stones    Hyperlipidemia    Hypertension    MI (myocardial infarction) ([place])    PE (pulmonary embolism)    TIA (transient ischemic attack)    hx of per pt - pt not aware he had not followed by a neurologist   Ventricular tachycardia ([place])     Current Outpatient Medications  Medication Instructions   allopurinol (ZYLOPRIM) 600 mg, Oral, Daily   aspirin EC 81 mg, Oral, Daily, Swallow whole.    calcium carbonate (TUMS - DOSED IN MG ELEMENTAL CALCIUM) 500 MG chewable tablet 2-3 tablets, Oral, Daily PRN   carvedilol (COREG) 25 mg, Oral, 2 times daily    cetirizine (ZYRTEC) 10 mg, Oral, Daily   cyclobenzaprine (FLEXERIL) 10 mg, Oral, 3 times daily PRN   ELIQUIS 5 MG TABS tablet TAKE 1 TABLET BY MOUTH 2 TIMES A DAY   ENTRESTO 49-51 MG TAKE 1 TABLET BY MOUTH 2 TIMES DAILY.   FARXIGA 10 MG TABS tablet TAKE 1 TABLET BY MOUTH DAILY BEFORE BREAKFAST.   fenofibrate 160 MG tablet TAKE 1 TABLET BY MOUTH DAILY.   levothyroxine (SYNTHROID) 50 MCG tablet TAKE 1 TABLET (50 MCG TOTAL) BY MOUTH DAILY 30 MINUTES BEFORE BREAKFAST AND ALL OTHER PILLS.   metFORMIN (GLUCOPHAGE) 1000 MG tablet TAKE 1 TABLET BY MOUTH 2 TIMES DAILY WITH A MEAL.   methocarbamol (ROBAXIN) 500 mg, Oral, Every 6 hours PRN   Naphazoline HCl (CLEAR EYES OP) 1 drop, Both Eyes, Daily PRN   pantoprazole (PROTONIX) 40 MG tablet TAKE 1 TABLET BY MOUTH DAILY.   sodium chloride (OCEAN) 0.65 % SOLN nasal spray 1 spray, Each Nare, As needed   spironolactone (ALDACTONE) 25 mg, Oral, Daily   traZODone (DESYREL) 100 mg, Oral, Daily at bedtime   triamcinolone (KENALOG) 0.025 % ointment 1 application , Topical, Daily PRN    Family History: Family History  Problem Relation Age of Onset   Dementia Mother        frontal-temporal   Stroke Mother    Breast cancer Maternal Aunt    Other Brother  overdose   Colon cancer Neg Hx     Physical Exam: Vitals:   01/01/23 1142  BP: 110/70  Pulse: 76  SpO2: 98%  Weight: 243 lb (110.2 kg)  Height: 6' 2"$  (1.88 m)     GEN- NAD. A&O x 3. Normal affect. HEENT: Normocephalic, atraumatic Lungs- CTAB, Normal effort.  Heart- Regular rate and rhythm rate and rhythm. No M/G/R.  Extremities- No peripheral edema. no clubbing or cyanosis Skin- warm and dry, no rash or lesion; ICD pocket well healed  ICD interrogation- reviewed in detail today,  See PACEART report  EKG is ordered today. Personal review shows NSR at 76 bpm with 1st degree AV block  Other studies Reviewed: Additional studies/ records that were reviewed today include: Previous EP  office notes.    Assessment and Plan:  Ischemic cardiomyopathy s/p Engineer, agricultural ICD    Ventricular tachycardia-nonsustained   Atrial fibrillation-persistent   Congestive heart failure-chronic-systolic-class II   Implantable defibrillator-[employer]  Volume status stable on exam.   Discussed episode with Dr. Caryl Comes, and we suspect VT occurred in the setting of acute, heavy ETOH use.  It is also possible that poor hydration in this setting could have led to electrolyte abnormalities.   Labs today.   Increase coreg to 25 mg BID.   Given that we are considering this episode "triggered" by acute ETOH use, we do not recommend resuming amiodarone at this time in this young pt.  If he has further episodes in the setting of increased BB and ETOH abstinence, could consider mexitil as a next step prior to resuming amiodarone given his young age.   We reviewed [place] driving restrictions x 6 months post appropriate ICD therapy.    Current medicines are reviewed at length with the patient today.    Labs/ tests ordered today include:  Orders Placed This Encounter  Procedures   Basic metabolic panel   Magnesium     Disposition:   Follow up with Dr. Caryl Comes in 6 months   Signed, Shirley Friar, PA-C  01/01/2023 12:06 PM  [facility] [address] [place] [address] [phone] (office) [phone] (fax)

## 2022-12-31 NOTE — Telephone Encounter (Signed)
Reviewed with Dr. Caryl Comes, will see tomorrow to assess and evaluate as planned.

## 2023-01-01 ENCOUNTER — Encounter: Payer: Self-pay | Admitting: Student

## 2023-01-01 ENCOUNTER — Ambulatory Visit: Payer: BC Managed Care – PPO | Attending: Student | Admitting: Student

## 2023-01-01 VITALS — BP 110/70 | HR 76 | Ht 74.0 in | Wt 243.0 lb

## 2023-01-01 DIAGNOSIS — Z95810 Presence of automatic (implantable) cardiac defibrillator: Secondary | ICD-10-CM | POA: Diagnosis not present

## 2023-01-01 DIAGNOSIS — I2510 Atherosclerotic heart disease of native coronary artery without angina pectoris: Secondary | ICD-10-CM

## 2023-01-01 DIAGNOSIS — I472 Ventricular tachycardia, unspecified: Secondary | ICD-10-CM

## 2023-01-01 DIAGNOSIS — I5042 Chronic combined systolic (congestive) and diastolic (congestive) heart failure: Secondary | ICD-10-CM

## 2023-01-01 DIAGNOSIS — I480 Paroxysmal atrial fibrillation: Secondary | ICD-10-CM | POA: Diagnosis not present

## 2023-01-01 LAB — CUP PACEART INCLINIC DEVICE CHECK
Date Time Interrogation Session: 20240220120821
HighPow Impedance: 76 Ohm
Implantable Lead Connection Status: 753985
Implantable Lead Connection Status: 753985
Implantable Lead Implant Date: 20211011
Implantable Lead Implant Date: 20211011
Implantable Lead Location: 753859
Implantable Lead Location: 753860
Implantable Lead Model: 138
Implantable Lead Model: 7841
Implantable Lead Serial Number: 1097338
Implantable Lead Serial Number: 303261
Implantable Pulse Generator Implant Date: 20211011
Lead Channel Impedance Value: 625 Ohm
Lead Channel Impedance Value: 654 Ohm
Lead Channel Pacing Threshold Amplitude: 0.6 V
Lead Channel Pacing Threshold Amplitude: 0.7 V
Lead Channel Pacing Threshold Pulse Width: 0.4 ms
Lead Channel Pacing Threshold Pulse Width: 0.4 ms
Lead Channel Sensing Intrinsic Amplitude: 18.3 mV
Lead Channel Sensing Intrinsic Amplitude: 7.4 mV
Lead Channel Setting Pacing Amplitude: 2.5 V
Lead Channel Setting Pacing Pulse Width: 0.4 ms
Lead Channel Setting Sensing Sensitivity: 0.5 mV
Pulse Gen Serial Number: 228262
Zone Setting Status: 755011

## 2023-01-01 MED ORDER — CARVEDILOL 25 MG PO TABS: 25.0000 mg | ORAL_TABLET | Freq: Two times a day (BID) | ORAL | 3 refills | Status: DC

## 2023-01-01 NOTE — Patient Instructions (Signed)
Medication Instructions:  Your physician has recommended you make the following change in your medication:   INCREASE the Carvedilol(Coreg) to 25 mg taking 1 twice a day  *If you need a refill on your cardiac medications before your next appointment, please call your pharmacy*   Lab Work: TODAY:  BMET & MAG  If you have labs (blood work) drawn today and your tests are completely normal, you will receive your results only by: [place] (if you have MyChart) OR A paper copy in the mail If you have any lab test that is abnormal or we need to change your treatment, we will call you to review the results.   Testing/Procedures: None ordered   Follow-Up: At [facility], you and your health needs are our priority.  As part of our continuing mission to provide you with exceptional heart care, we have created designated Provider Care Teams.  These Care Teams include your primary Cardiologist (physician) and Advanced Practice Providers (APPs -  Physician Assistants and Nurse Practitioners) who all work together to provide you with the care you need, when you need it.  We recommend signing up for the patient portal called "MyChart".  Sign up information is provided on this After Visit Summary.  MyChart is used to connect with patients for Virtual Visits (Telemedicine).  Patients are able to view lab/test results, encounter notes, upcoming appointments, etc.  Non-urgent messages can be sent to your provider as well.   To learn more about what you can do with MyChart, go to [email or web address].    Your next appointment:   6 month(s)  Provider:   Virl Axe, MD    Other Instructions

## 2023-01-02 ENCOUNTER — Other Ambulatory Visit: Payer: Self-pay | Admitting: Family Medicine

## 2023-01-02 ENCOUNTER — Other Ambulatory Visit (HOSPITAL_COMMUNITY): Payer: Self-pay | Admitting: Internal Medicine

## 2023-01-02 ENCOUNTER — Telehealth: Payer: Self-pay | Admitting: *Deleted

## 2023-01-02 LAB — MAGNESIUM: Magnesium: 1.7 mg/dL (ref 1.6–2.3)

## 2023-01-02 LAB — BASIC METABOLIC PANEL
BUN/Creatinine Ratio: 12 (ref 9–20)
BUN: 13 mg/dL (ref 6–24)
CO2: 22 mmol/L (ref 20–29)
Calcium: 9.9 mg/dL (ref 8.7–10.2)
Chloride: 100 mmol/L (ref 96–106)
Creatinine, Ser: 1.08 mg/dL (ref 0.76–1.27)
Glucose: 181 mg/dL — ABNORMAL HIGH (ref 70–99)
Potassium: 4.7 mmol/L (ref 3.5–5.2)
Sodium: 137 mmol/L (ref 134–144)
eGFR: 80 mL/min/{1.73_m2} (ref >59)

## 2023-01-02 MED ORDER — MAGNESIUM OXIDE 400 MG PO CAPS: 1.0000 | ORAL_CAPSULE | Freq: Two times a day (BID) | ORAL | 11 refills | Status: DC

## 2023-01-02 NOTE — Addendum Note (Signed)
Addended by: Claude Manges on: 01/02/2023 10:05 AM   Modules accepted: Orders

## 2023-01-02 NOTE — Telephone Encounter (Signed)
-----   Message from Shirley Friar, PA-C sent at 01/02/2023  8:47 AM EST ----- Potassium was OK, but Mg was low.   Can we please start Mag ox 400 mg BID?

## 2023-01-21 ENCOUNTER — Other Ambulatory Visit: Payer: Self-pay | Admitting: Family Medicine

## 2023-01-30 ENCOUNTER — Encounter (HOSPITAL_COMMUNITY): Payer: Self-pay | Admitting: Internal Medicine

## 2023-01-30 ENCOUNTER — Other Ambulatory Visit (HOSPITAL_COMMUNITY): Payer: Self-pay

## 2023-01-30 ENCOUNTER — Telehealth: Payer: Self-pay

## 2023-01-30 ENCOUNTER — Telehealth (HOSPITAL_COMMUNITY): Payer: Self-pay | Admitting: Cardiology

## 2023-01-30 MED ORDER — REPATHA SURECLICK 140 MG/ML ~~LOC~~ SOAJ: 1.0000 | SUBCUTANEOUS | 11 refills | Status: DC

## 2023-01-30 NOTE — Telephone Encounter (Signed)
Pharmacy Patient Advocate Encounter   Received notification from [facility] that prior authorization for REPATHA 140 MG/ML INJ is needed.    PA submitted on 01/30/23 [place] [id number] Status is pending  Karie Soda, [place] Patient Advocate Specialist Direct Number: [phone] Fax: [phone]

## 2023-01-30 NOTE — Telephone Encounter (Signed)
Pt was  notified by pharmacy that repatha will need a PA as well

## 2023-01-30 NOTE — Telephone Encounter (Signed)
Patient called to request a refill of [place] would forward to lipid clinic pharmacy team to ensure correct dose is refilled

## 2023-02-04 ENCOUNTER — Other Ambulatory Visit: Payer: Self-pay | Admitting: Family Medicine

## 2023-02-04 ENCOUNTER — Other Ambulatory Visit (HOSPITAL_COMMUNITY): Payer: Self-pay | Admitting: Internal Medicine

## 2023-02-04 DIAGNOSIS — E039 Hypothyroidism, unspecified: Secondary | ICD-10-CM

## 2023-02-04 MED ORDER — REPATHA SURECLICK 140 MG/ML ~~LOC~~ SOAJ: 1.0000 | SUBCUTANEOUS | 11 refills | Status: DC

## 2023-02-04 NOTE — Addendum Note (Signed)
Addended by: Kendre Sires E on: 02/04/2023 08:46 AM   Modules accepted: Orders

## 2023-02-04 NOTE — Telephone Encounter (Signed)
Pharmacy Patient Advocate Encounter  Prior Authorization for REPATHA 140 MG/ML INJ  has been approved.    Effective dates: 01/30/23 through 01/30/24  Karie Soda, [place] Patient Advocate Specialist Direct Number: [phone] Fax: [phone]

## 2023-02-04 NOTE — Telephone Encounter (Signed)
Rx refilled since PA has been approved.

## 2023-02-07 ENCOUNTER — Encounter (HOSPITAL_COMMUNITY): Payer: Self-pay | Admitting: Internal Medicine

## 2023-02-07 ENCOUNTER — Other Ambulatory Visit (HOSPITAL_COMMUNITY): Payer: Self-pay | Admitting: Internal Medicine

## 2023-02-07 ENCOUNTER — Telehealth (HOSPITAL_COMMUNITY): Payer: Self-pay

## 2023-02-07 ENCOUNTER — Encounter: Payer: Self-pay | Admitting: Family Medicine

## 2023-02-07 ENCOUNTER — Other Ambulatory Visit (HOSPITAL_COMMUNITY): Payer: Self-pay

## 2023-02-07 NOTE — Telephone Encounter (Signed)
Is it ok to refill his Vascepa? Not on active med list and discontinued by other office in error. Just wanted to make sure before sending.  Thanks!

## 2023-02-21 ENCOUNTER — Ambulatory Visit (INDEPENDENT_AMBULATORY_CARE_PROVIDER_SITE_OTHER): Payer: BC Managed Care – PPO

## 2023-02-21 DIAGNOSIS — I255 Ischemic cardiomyopathy: Secondary | ICD-10-CM | POA: Diagnosis not present

## 2023-02-21 LAB — CUP PACEART REMOTE DEVICE CHECK
Battery Remaining Longevity: 138 mo
Battery Remaining Percentage: 100 %
Brady Statistic RA Percent Paced: 0 %
Brady Statistic RV Percent Paced: 0 %
Date Time Interrogation Session: 20240411022500
HighPow Impedance: 85 Ohm
Implantable Lead Connection Status: 753985
Implantable Lead Connection Status: 753985
Implantable Lead Implant Date: 20211011
Implantable Lead Implant Date: 20211011
Implantable Lead Location: 753859
Implantable Lead Location: 753860
Implantable Lead Model: 138
Implantable Lead Model: 7841
Implantable Lead Serial Number: 1097338
Implantable Lead Serial Number: 303261
Implantable Pulse Generator Implant Date: 20211011
Lead Channel Impedance Value: 647 Ohm
Lead Channel Impedance Value: 745 Ohm
Lead Channel Setting Pacing Amplitude: 2.5 V
Lead Channel Setting Pacing Pulse Width: 0.4 ms
Lead Channel Setting Sensing Sensitivity: 0.5 mV
Pulse Gen Serial Number: 228262
Zone Setting Status: 755011

## 2023-03-06 ENCOUNTER — Other Ambulatory Visit: Payer: Self-pay | Admitting: Family Medicine

## 2023-03-06 NOTE — Telephone Encounter (Signed)
Called to find out which of these medications he prefers as per Dr Beverely Low message she will not refill both

## 2023-03-06 NOTE — Telephone Encounter (Signed)
Patient is requesting a refill of the following medications: Requested Prescriptions   Pending Prescriptions Disp Refills   cyclobenzaprine (FLEXERIL) 10 MG tablet [Pharmacy Med Name: CYCLOBENZAPRINE HCL 10 MG T 10 Tablet] 30 tablet 0    Sig: TAKE 1 TABLET BY MOUTH 3 TIMES DAILY AS NEEDED FOR MUSCLE SPASMS.   methocarbamol (ROBAXIN) 500 MG tablet [Pharmacy Med Name: METHOCARBAMOL 500 MG TABS 500 Tablet] 90 tablet 0    Sig: TAKE 1 TABLET BY MOUTH EVERY 6 HOURS AS NEEDED FOR MUSCLE SPASMS.    Date of patient request: 03/06/23 Last office visit: 07/25/22 Date of last refill: 01/21/23 Last refill amount: 90

## 2023-03-06 NOTE — Telephone Encounter (Signed)
Patient called back. Patient would like a refill his flexeril 10 mg.

## 2023-03-06 NOTE — Telephone Encounter (Signed)
Pharmacy is asking for refill on both medications . Is he suppose to be taking both ?  I did call the pt and he states he is Methocarbamol in the am and the Flexeril in the pm .

## 2023-03-06 NOTE — Telephone Encounter (Signed)
These are both muscle relaxers.  I will refill 1 but not both

## 2023-03-07 ENCOUNTER — Ambulatory Visit: Payer: BC Managed Care – PPO | Admitting: Physician Assistant

## 2023-03-07 NOTE — Telephone Encounter (Signed)
Attempted to reach pt to inform him the Methocarbamol has been denied by Dr Beverely Low

## 2023-03-28 NOTE — Progress Notes (Signed)
Remote ICD transmission.   

## 2023-04-25 ENCOUNTER — Telehealth: Payer: BC Managed Care – PPO | Admitting: Family Medicine

## 2023-04-25 ENCOUNTER — Encounter: Payer: Self-pay | Admitting: Family Medicine

## 2023-04-25 VITALS — BP 124/87 | HR 86 | Wt 243.0 lb

## 2023-04-25 DIAGNOSIS — M545 Low back pain, unspecified: Secondary | ICD-10-CM

## 2023-04-25 MED ORDER — PREDNISONE 10 MG PO TABS: ORAL_TABLET | ORAL | 0 refills | Status: DC

## 2023-04-25 MED ORDER — METHOCARBAMOL 500 MG PO TABS: 500.0000 mg | ORAL_TABLET | Freq: Four times a day (QID) | ORAL | 0 refills | Status: DC | PRN

## 2023-04-25 NOTE — Progress Notes (Signed)
Virtual Visit via Video   I connected with patient on 04/25/23 at  9:40 AM EDT by a video enabled telemedicine application and verified that I am speaking with the correct person using two identifiers.  Location patient: Home Location provider: [facility] Persons participating in the virtual visit: Patient, Provider, CMA Zenon Mayo)  I discussed the limitations of evaluation and management by telemedicine and the availability of in person appointments. The patient expressed understanding and agreed to proceed.  Subjective:   HPI:   Back spasm- sxs started 2 weeks ago after having to break up a dog fight.  Pain is L sided from SI joint up to mid- back.  No radiation into leg.  Pt reports sxs will ease off overnight but then worsen again as the day goes on.  Has spent most of the last 2 weeks on a heating pad and lying down.  Has been taking Flexeril w/o relief and this makes him drowsy.  Can take Methocarbamol during day.    ROS:   See pertinent positives and negatives per HPI.  Patient Active Problem List   Diagnosis Date Noted   Hypothyroid 07/25/2022   Primary osteoarthritis of right knee 10/03/2021   Primary localized osteoarthritis of right knee 10/03/2021   Ischemic cardiomyopathy 03/09/2021   Persistent atrial fibrillation (HCC) 03/09/2021   ICD (implantable cardioverter-defibrillator) in place - BS 12/05/2020   Coronary artery disease involving native coronary artery of native heart without angina pectoris 09/21/2020   Chronic combined systolic and diastolic congestive heart failure (HCC) 09/21/2020   V-tach (HCC) 09/21/2020   Nasal septal perforation 08/10/2020   TIA (transient ischemic attack) 06/17/2020   GERD (gastroesophageal reflux disease) 08/05/2019   HTN (hypertension) 04/16/2018   Insomnia 01/22/2018   Controlled diabetes mellitus type 2 with complications (HCC) 07/07/2015   Thoracic back pain 04/19/2014   Degenerative disc disease, cervical  08/21/2012   Routine general medical examination at a health care facility 04/17/2012   Hyperlipidemia 04/17/2012   Neck pain 04/17/2012   OBESITY 12/07/2009   FASCIITIS, PLANTAR 03/29/2009   PULMONARY EMBOLISM, HX OF 11/18/2008   NEPHROLITHIASIS, HX OF 11/18/2008   DRUG ABUSE, HX OF 11/18/2008   GOUT 03/22/2008   NUMMULAR ECZEMA 03/22/2008    Social History   Tobacco Use   Smoking status: Some Days    Packs/day: 0.25    Years: 31.00    Additional pack years: 0.00    Total pack years: 7.75    Types: Cigarettes   Smokeless tobacco: Never   Tobacco comments:    Stopped a few weeks ago  Substance Use Topics   Alcohol use: Yes    Comment: once a month    Current Outpatient Medications:    aspirin EC 81 MG tablet, Take 81 mg by mouth daily. Swallow whole., Disp: , Rfl:    calcium carbonate (TUMS - DOSED IN MG ELEMENTAL CALCIUM) 500 MG chewable tablet, Chew 2-3 tablets by mouth daily as needed for indigestion or heartburn., Disp: , Rfl:    carvedilol (COREG) 25 MG tablet, Take 1 tablet (25 mg total) by mouth 2 (two) times daily., Disp: 180 tablet, Rfl: 3   cetirizine (ZYRTEC) 10 MG tablet, Take 10 mg by mouth daily., Disp: , Rfl:    cyclobenzaprine (FLEXERIL) 10 MG tablet, TAKE 1 TABLET BY MOUTH 3 TIMES DAILY AS NEEDED FOR MUSCLE SPASMS., Disp: 45 tablet, Rfl: 1   ELIQUIS 5 MG TABS tablet, TAKE 1 TABLET BY MOUTH 2 TIMES A DAY,  Disp: 60 tablet, Rfl: 11   ENTRESTO 49-51 MG, TAKE 1 TABLET BY MOUTH 2 TIMES DAILY., Disp: 60 tablet, Rfl: 11   Evolocumab (REPATHA SURECLICK) 140 MG/ML SOAJ, Inject 140 mg into the skin every 14 (fourteen) days., Disp: 2 mL, Rfl: 11   FARXIGA 10 MG TABS tablet, TAKE 1 TABLET BY MOUTH DAILY BEFORE BREAKFAST., Disp: 30 tablet, Rfl: 0   fenofibrate 160 MG tablet, TAKE 1 TABLET BY MOUTH DAILY., Disp: 90 tablet, Rfl: 1   levothyroxine (SYNTHROID) 50 MCG tablet, TAKE 1 TABLET (50 MCG TOTAL) BY MOUTH DAILY 30 MINUTES BEFORE BREAKFAST AND ALL OTHER PILLS., Disp: 30  tablet, Rfl: 3   Magnesium Oxide 400 MG CAPS, Take 1 capsule (400 mg total) by mouth in the morning and at bedtime., Disp: 60 capsule, Rfl: 11   metFORMIN (GLUCOPHAGE) 1000 MG tablet, TAKE 1 TABLET BY MOUTH 2 TIMES DAILY WITH A MEAL., Disp: 60 tablet, Rfl: 0   Naphazoline HCl (CLEAR EYES OP), Place 1 drop into both eyes daily as needed (sore eyes)., Disp: , Rfl:    pantoprazole (PROTONIX) 40 MG tablet, TAKE 1 TABLET BY MOUTH DAILY., Disp: 30 tablet, Rfl: 2   sodium chloride (OCEAN) 0.65 % SOLN nasal spray, Place 1 spray into both nostrils as needed (dryness)., Disp: , Rfl:    spironolactone (ALDACTONE) 25 MG tablet, TAKE 1 TABLET (25 MG TOTAL) BY MOUTH DAILY., Disp: 30 tablet, Rfl: 11   traZODone (DESYREL) 100 MG tablet, Take 1 tablet (100 mg total) by mouth at bedtime., Disp: 90 tablet, Rfl: 1   triamcinolone (KENALOG) 0.025 % ointment, Apply 1 application. topically daily as needed (irritation)., Disp: 80 g, Rfl: 3   VASCEPA 1 g capsule, TAKE 2 CAPSULES BY MOUTH 2 TIMES DAILY., Disp: 120 capsule, Rfl: 3   allopurinol (ZYLOPRIM) 300 MG tablet, TAKE 2 TABLETS (600 MG TOTAL) BY MOUTH DAILY., Disp: 60 tablet, Rfl: 6   methocarbamol (ROBAXIN) 500 MG tablet, TAKE 1 TABLET BY MOUTH EVERY 6 HOURS AS NEEDED FOR MUSCLE SPASMS. (Patient not taking: Reported on 04/25/2023), Disp: 90 tablet, Rfl: 0  Not on File  Objective:   BP 124/87   Pulse 86   Wt 243 lb (110.2 kg)   BMI 31.20 kg/m  AAOx3, NAD NCAT, EOMI No obvious CN deficits Coloring WNL Pt is able to speak clearly, coherently without shortness of breath or increased work of breathing.  Thought process is linear.  Mood is appropriate.   Assessment and Plan:   Acute L sided low back pain- new.  Sxs started 2 weeks ago and have failed conservative management of Flexeril, heat, and NSAIDs.  Will start low dose prednisone taper but cautioned him regarding his sugar and need to be very mindful of his carb intake.  Will refill Methocarbamol to use  during the day w/o sedation.  If no improvement after prednisone, will need to pursue imaging.  Pt expressed understanding and is in agreement w/ plan.    Neena Rhymes, MD 04/25/2023

## 2023-05-14 ENCOUNTER — Other Ambulatory Visit: Payer: Self-pay | Admitting: Family Medicine

## 2023-05-20 ENCOUNTER — Other Ambulatory Visit: Payer: Self-pay | Admitting: Family Medicine

## 2023-05-23 ENCOUNTER — Ambulatory Visit: Payer: BC Managed Care – PPO

## 2023-05-23 DIAGNOSIS — I255 Ischemic cardiomyopathy: Secondary | ICD-10-CM | POA: Diagnosis not present

## 2023-05-23 LAB — CUP PACEART REMOTE DEVICE CHECK
Battery Remaining Longevity: 138 mo
Battery Remaining Percentage: 100 %
Brady Statistic RA Percent Paced: 0 %
Brady Statistic RV Percent Paced: 0 %
Date Time Interrogation Session: 20240711004100
HighPow Impedance: 87 Ohm
Implantable Lead Connection Status: 753985
Implantable Lead Connection Status: 753985
Implantable Lead Implant Date: 20211011
Implantable Lead Implant Date: 20211011
Implantable Lead Location: 753859
Implantable Lead Location: 753860
Implantable Lead Model: 138
Implantable Lead Model: 7841
Implantable Lead Serial Number: 1097338
Implantable Lead Serial Number: 303261
Implantable Pulse Generator Implant Date: 20211011
Lead Channel Impedance Value: 624 Ohm
Lead Channel Impedance Value: 697 Ohm
Lead Channel Setting Pacing Amplitude: 2.5 V
Lead Channel Setting Pacing Pulse Width: 0.4 ms
Lead Channel Setting Sensing Sensitivity: 0.5 mV
Pulse Gen Serial Number: 228262
Zone Setting Status: 755011

## 2023-05-29 ENCOUNTER — Ambulatory Visit (HOSPITAL_COMMUNITY)
Admission: RE | Admit: 2023-05-29 | Discharge: 2023-05-29 | Disposition: A | Discharge status: Home / Self Care | Payer: BC Managed Care – PPO | Source: Ambulatory Visit | Attending: Family Medicine | Admitting: Family Medicine

## 2023-05-29 ENCOUNTER — Ambulatory Visit (HOSPITAL_BASED_OUTPATIENT_CLINIC_OR_DEPARTMENT_OTHER)
Admission: RE | Admit: 2023-05-29 | Discharge: 2023-05-29 | Disposition: A | Discharge status: Home / Self Care | Payer: BC Managed Care – PPO | Source: Ambulatory Visit | Attending: Internal Medicine | Admitting: Internal Medicine

## 2023-05-29 ENCOUNTER — Encounter (HOSPITAL_COMMUNITY): Payer: Self-pay | Admitting: Internal Medicine

## 2023-05-29 VITALS — BP 96/68 | HR 80 | Wt 234.6 lb

## 2023-05-29 DIAGNOSIS — I5022 Chronic systolic (congestive) heart failure: Secondary | ICD-10-CM

## 2023-05-29 DIAGNOSIS — I480 Paroxysmal atrial fibrillation: Secondary | ICD-10-CM | POA: Diagnosis not present

## 2023-05-29 DIAGNOSIS — E039 Hypothyroidism, unspecified: Secondary | ICD-10-CM | POA: Insufficient documentation

## 2023-05-29 DIAGNOSIS — Z7984 Long term (current) use of oral hypoglycemic drugs: Secondary | ICD-10-CM | POA: Insufficient documentation

## 2023-05-29 DIAGNOSIS — Z79899 Other long term (current) drug therapy: Secondary | ICD-10-CM | POA: Insufficient documentation

## 2023-05-29 DIAGNOSIS — Z823 Family history of stroke: Secondary | ICD-10-CM | POA: Diagnosis not present

## 2023-05-29 DIAGNOSIS — I110 Hypertensive heart disease with heart failure: Secondary | ICD-10-CM | POA: Insufficient documentation

## 2023-05-29 DIAGNOSIS — I5042 Chronic combined systolic (congestive) and diastolic (congestive) heart failure: Secondary | ICD-10-CM | POA: Diagnosis not present

## 2023-05-29 DIAGNOSIS — E119 Type 2 diabetes mellitus without complications: Secondary | ICD-10-CM | POA: Insufficient documentation

## 2023-05-29 DIAGNOSIS — Z7901 Long term (current) use of anticoagulants: Secondary | ICD-10-CM | POA: Insufficient documentation

## 2023-05-29 DIAGNOSIS — E785 Hyperlipidemia, unspecified: Secondary | ICD-10-CM | POA: Diagnosis not present

## 2023-05-29 DIAGNOSIS — E669 Obesity, unspecified: Secondary | ICD-10-CM | POA: Insufficient documentation

## 2023-05-29 DIAGNOSIS — I2510 Atherosclerotic heart disease of native coronary artery without angina pectoris: Secondary | ICD-10-CM | POA: Diagnosis not present

## 2023-05-29 DIAGNOSIS — I4819 Other persistent atrial fibrillation: Secondary | ICD-10-CM | POA: Diagnosis not present

## 2023-05-29 DIAGNOSIS — Z95810 Presence of automatic (implantable) cardiac defibrillator: Secondary | ICD-10-CM | POA: Diagnosis not present

## 2023-05-29 DIAGNOSIS — Z7982 Long term (current) use of aspirin: Secondary | ICD-10-CM | POA: Insufficient documentation

## 2023-05-29 DIAGNOSIS — Z720 Tobacco use: Secondary | ICD-10-CM

## 2023-05-29 DIAGNOSIS — I472 Ventricular tachycardia, unspecified: Secondary | ICD-10-CM

## 2023-05-29 DIAGNOSIS — F17210 Nicotine dependence, cigarettes, uncomplicated: Secondary | ICD-10-CM | POA: Insufficient documentation

## 2023-05-29 DIAGNOSIS — Z79890 Hormone replacement therapy (postmenopausal): Secondary | ICD-10-CM | POA: Insufficient documentation

## 2023-05-29 DIAGNOSIS — I428 Other cardiomyopathies: Secondary | ICD-10-CM | POA: Insufficient documentation

## 2023-05-29 DIAGNOSIS — Z6830 Body mass index (BMI) 30.0-30.9, adult: Secondary | ICD-10-CM | POA: Diagnosis not present

## 2023-05-29 LAB — ECHOCARDIOGRAM COMPLETE
Calc EF: 35.6 %
S' Lateral: 4.4 cm
Single Plane A2C EF: 36.6 %
Single Plane A4C EF: 34.7 %

## 2023-05-29 LAB — BASIC METABOLIC PANEL
Anion gap: 9 (ref 5–15)
BUN: 18 mg/dL (ref 6–20)
CO2: 23 mmol/L (ref 22–32)
Calcium: 9.4 mg/dL (ref 8.9–10.3)
Chloride: 102 mmol/L (ref 98–111)
Creatinine, Ser: 1.18 mg/dL (ref 0.61–1.24)
GFR, Estimated: >60 mL/min (ref >60)
Glucose, Bld: 146 mg/dL — ABNORMAL HIGH (ref 70–99)
Potassium: 4.7 mmol/L (ref 3.5–5.1)
Sodium: 134 mmol/L — ABNORMAL LOW (ref 135–145)

## 2023-05-29 LAB — BRAIN NATRIURETIC PEPTIDE: B Natriuretic Peptide: 85.1 pg/mL (ref 0.0–100.0)

## 2023-05-29 LAB — LIPID PANEL
Cholesterol: 88 mg/dL (ref 0–200)
HDL: 37 mg/dL — ABNORMAL LOW (ref >40)
LDL Cholesterol: 15 mg/dL (ref 0–99)
Total CHOL/HDL Ratio: 2.4 RATIO
Triglycerides: 181 mg/dL — ABNORMAL HIGH (ref <150)
VLDL: 36 mg/dL (ref 0–40)

## 2023-05-29 LAB — TSH: TSH: 0.063 u[IU]/mL — ABNORMAL LOW (ref 0.350–4.500)

## 2023-05-29 NOTE — Patient Instructions (Addendum)
Medication Changes:  No Changes In Medications at this time.   Lab Work:  Labs done today, your results will be available in MyChart, we will contact you for abnormal readings.  Referrals:  YOU HAVE BEEN REFERRED TO PHARMD THEY WILL REACH OUT TO YOU OR CALL TO ARRANGE THIS. PLEASE CALL us WITH ANY CONCERNS   Special Instructions // Education:  IF YOUR SYSTOLIC BLOOD PRESSURE (TOP NUMBER) IS CONSISTENTLY LOWER THAN 100 PLEASE LET us KNOW   PLEASE STOP SMOKING   Follow-Up in: 6 MONTHS PLEASE CALL OUR OFFICE AROUND NOVEMBER TO GET SCHEDULED FOR YOUR APPOINTMENT. PHONE NUMBER IS [phone] OPTION 2   At the [facility], you and your health needs are our priority. We have a designated team specialized in the treatment of Heart Failure. This Care Team includes your primary Heart Failure Specialized Cardiologist (physician), Advanced Practice Providers (APPs- Physician Assistants and Nurse Practitioners), and Pharmacist who all work together to provide you with the care you need, when you need it.   You may see any of the following providers on your designated Care Team at your next follow up:  Dr. Arvilla Meres Dr. Marca Ancona Dr. Marcos Eke, NP Robbie Lis, [place] [facility] [place], [place] Brynda Peon, NP Karle Plumber, PharmD   Please be sure to bring in all your medications bottles to every appointment.   Need to Contact us:  If you have any questions or concerns before your next appointment please send [place] a message through [place] or call our office at [phone].    TO LEAVE A MESSAGE FOR THE NURSE SELECT OPTION 2, PLEASE LEAVE A MESSAGE INCLUDING: YOUR NAME DATE OF BIRTH CALL BACK NUMBER REASON FOR CALL**this is important as we prioritize the call backs  YOU WILL RECEIVE A CALL BACK THE SAME DAY AS LONG AS YOU CALL BEFORE 4:00 PM

## 2023-05-29 NOTE — Progress Notes (Signed)
AD[facility]ED [facility] CLINIC NOTE PCP: Dr. Neena Rhymes HF: Dr. [name] EP: Dr. Graciela Husbands   HPI:  [name] is 57 year old with a PMH of severe systolic heart failure due to NICM, DM2, former polysubstance and tobacco abuse.    Echo 2008 EF 15% in setting of polysubstance abuse.   ECHO 12/2007 EF 55-60%  Echo 10/20 EF 55-60% no RWMA.   On 06/07/20 had TIA-like symptoms with mild aphasia and weakness (dropped a can with left hand). Went to PCP had carotid u/s and echo. Carotid u/s 06/22/20 1-39% bilaterally. Echo 8/21 EF down to 25-30%. Brain MRI/MRA. Normal vasculature.  Left corona radiata diffusion-weighted/FLAIR hyperintensity is nonspecific. Differential includes active demyelinating lesionversus subacute insult.  Underwent R/L cath 07/01/20 LAD 100% prox LCX 20% RCA 10%  EF 25-30% Ao = 112/72 (90) LV = 111/27 RA = 2 RV = 46/7 PA = 44/12 (27) PCW = 16 Fick cardiac output/index = 6.8/2.9 PVR = 1.5 WU SVR 1047 Ao sat = 96% PA sat = 70%, 78% SVC sat = 74%   After cath underwent cMRI.for viability  - LVEF 26% - LGE suggestive of a very large, wrap-around LAD infarction with extensive no-reflow affecting the subendocardium.   Zio monitored placed for palpitations in 10/21 and found to have VT. Underwent ICD placement by Dr. Graciela Husbands on 08/22/20. Was also found to have volume overload and AF.  Here for routine f/u. Feels fine. Still working with his dogs. No CP or SOB. No edema, orthopnea or PND.  Echo today 05/29/23 EF 35-40% Personally reviewed  ICD interrogated HL 6, No VT/VF/AT. Volume minimally elevated. Activity level average 1.2 hr/Day Volume ok Personally reviewed   Past Medical History:  Diagnosis Date   AICD (automatic cardioverter/defibrillator) present    Allergy    Arthritis    Atrial fibrillation (HCC)    CHF (congestive heart failure) (HCC)    CHF (congestive heart failure) (HCC)    Diabetes mellitus without complication (HCC)    Drug abuse (HCC)    Eczema     GERD (gastroesophageal reflux disease)    Gout    Heart attack (HCC) 06/10/2020   History of kidney stones    Hyperlipidemia    Hypertension    MI (myocardial infarction) (HCC)    PE (pulmonary embolism)    TIA (transient ischemic attack)    hx of per pt - pt not aware he had not followed by a neurologist   Ventricular tachycardia (HCC)     Current Outpatient Medications  Medication Sig Dispense Refill   allopurinol (ZYLOPRIM) 300 MG tablet TAKE 2 TABLETS (600 MG TOTAL) BY MOUTH DAILY. 60 tablet 6   aspirin EC 81 MG tablet Take 81 mg by mouth daily. Swallow whole.     calcium carbonate (TUMS - DOSED IN MG ELEMENTAL CALCIUM) 500 MG chewable tablet Chew 2-3 tablets by mouth daily as needed for indigestion or heartburn.     carvedilol (COREG) 25 MG tablet Take 1 tablet (25 mg total) by mouth 2 (two) times daily. 180 tablet 3   cetirizine (ZYRTEC) 10 MG tablet Take 10 mg by mouth daily.     cyclobenzaprine (FLEXERIL) 10 MG tablet Take 10 mg by mouth at bedtime.     ELIQUIS 5 MG TABS tablet TAKE 1 TABLET BY MOUTH 2 TIMES A DAY 60 tablet 11   ENTRESTO 49-51 MG TAKE 1 TABLET BY MOUTH 2 TIMES DAILY. 60 tablet 11   Evolocumab (REPATHA SURECLICK) 140 MG/ML SOAJ Inject  140 mg into the skin every 14 (fourteen) days. 2 mL 11   FARXIGA 10 MG TABS tablet TAKE 1 TABLET BY MOUTH DAILY BEFORE BREAKFAST. 30 tablet 0   fenofibrate 160 MG tablet TAKE 1 TABLET BY MOUTH DAILY. 90 tablet 1   levothyroxine (SYNTHROID) 50 MCG tablet TAKE 1 TABLET (50 MCG TOTAL) BY MOUTH DAILY 30 MINUTES BEFORE BREAKFAST AND ALL OTHER PILLS. 30 tablet 3   Magnesium Oxide 400 MG CAPS Take 1 capsule (400 mg total) by mouth in the morning and at bedtime. 60 capsule 11   metFORMIN (GLUCOPHAGE) 1000 MG tablet TAKE 1 TABLET BY MOUTH 2 TIMES DAILY WITH A MEAL. 60 tablet 0   methocarbamol (ROBAXIN) 500 MG tablet TAKE 1 TABLET BY MOUTH EVERY 6 HOURS AS NEEDED FOR MUSCLE SPASMS. 90 tablet 0   Naphazoline HCl (CLEAR EYES OP) Place 1  drop into both eyes daily as needed (sore eyes).     pantoprazole (PROTONIX) 40 MG tablet TAKE 1 TABLET BY MOUTH DAILY. 30 tablet 2   sodium chloride (OCEAN) 0.65 % SOLN nasal spray Place 1 spray into both nostrils as needed (dryness).     spironolactone (ALDACTONE) 25 MG tablet TAKE 1 TABLET (25 MG TOTAL) BY MOUTH DAILY. 30 tablet 11   traZODone (DESYREL) 100 MG tablet TAKE 1 TABLET (100 MG TOTAL) BY MOUTH AT BEDTIME. 90 tablet 1   triamcinolone (KENALOG) 0.025 % ointment Apply 1 application. topically daily as needed (irritation). 80 g 3   VASCEPA 1 g capsule TAKE 2 CAPSULES BY MOUTH 2 TIMES DAILY. 120 capsule 3   No current facility-administered medications for this encounter.    Not on File    Social History   Socioeconomic History   Marital status: Married    Spouse name: [name]   Number of children: Not on file   Years of education: Not on file   Highest education level: Bachelor's degree (e.g., BA, AB, BS)  Occupational History   Not on file  Tobacco Use   Smoking status: Some Days    Current packs/day: 0.25    Average packs/day: 0.3 packs/day for 31.0 years (7.8 ttl pk-yrs)    Types: Cigarettes   Smokeless tobacco: Never   Tobacco comments:    Stopped a few weeks ago  Vaping Use   Vaping status: Never Used  Substance and Sexual Activity   Alcohol use: Yes    Comment: once a month   Drug use: Not Currently    Types: Marijuana    Comment: gummies every days   Sexual activity: Not on file  Other Topics Concern   Not on file  Social History Narrative   Lives with wife   Caffeine- coffee 4 c daily   Social Determinants of Health   Financial Resource Strain: Not on file  Food Insecurity: Not on file  Transportation Needs: Not on file  Physical Activity: Not on file  Stress: Not on file  Social Connections: Not on file  Intimate Partner Violence: Not on file      Family History  Problem Relation Age of Onset   Dementia Mother        frontal-temporal    Stroke Mother    Breast cancer Maternal Aunt    Other Brother        overdose   Colon cancer Neg Hx     Vitals:   05/29/23 1352  BP: 90/60  Pulse: 80  SpO2: 95%  Weight: 106.4 kg (234 lb 9.6 oz)  PHYSICAL EXAM: General:  Well appearing. No resp difficulty HEENT: normal Neck: supple. no JVD. Carotids 2+ bilat; no bruits. No lymphadenopathy or thryomegaly appreciated. Cor: PMI nondisplaced. Regular rate & rhythm. No rubs, gallops or murmurs. Lungs: clear Abdomen: soft, nontender, nondistended. No hepatosplenomegaly. No bruits or masses. Good bowel sounds. Extremities: no cyanosis, clubbing, rash, edema Neuro: alert & orientedx3, cranial nerves grossly intact. moves all 4 extremities w/o difficulty. Affect pleasant  ECG: NSR 79 1AVB 218 LAFB. Anterior Qs. Personally reviewed  ASSESSMENT & PLAN:  Chronic Systolic HF due to iCM - Echo 4742 EF 15% in setting of polysubstance abuse.  - ECHO 12/2007 EF 55-60% - Echo 10/20 EF 55-60% no RWMA.  - Echo 8/21 EF 25-30% - Cath 8/21 LAD 100% LCx 20% RCA 10% - cMRI 08/02/20 EF 26% Large anterior infarct - ICD placed 10/21 for VT - Echo 10/17/20 EF 30-35% - Echo 6/22 EF 40-45%  - Echo 05/10/22 EF 40-45% (read as 35-40%)  - Echo today 05/29/23 EF 35-40% Personally reviewed - Doing very well. NYHA I-II - Continue carvedilol 18.75 bid - Continue Entresto 49/51 bid (previously cut back due to low BP) - Continue Farxiga 10 mg daily. - Continue spiro 25 daily - SBP 105-100 typically but in the 90s today. Continue to follow home BPs. Let me know if SBP routinely < 100  2. CAD - cath as above with subacute LAD infarct and CTO LAD - No s/s angina - continue ASA, statin.  - Recent LDL was 13 in 9/23. Followed by Lipid clinic  3. VT - s/p ICD 10/21 - Now off amio - Keep K. 4.0 Mg > 2.0  4. Persistent AF - In NSR here. No AF on ICD - Continue Eliquis 5 mg bid. No bleeding  5. Tobacco use -  still working on quitting.Smoking  ~5cig/day - Discussed quitting  6. DM2 - continue SGLT2i, metformin  - followed by PCP.  - Refer to PharmD for GLP1-RA  7. Hx Possible TIA - carotid dopplers ok - Brain MRI/MRA. Vasculature ok. Findings suspicious for cardio-embolism.   8. Hypothyroidism - Per PCP. Now off amio  9. Obesity - Body mass index is 30.12 kg/m. - Refer to PharmD for GLP1-RA  Arvilla Meres, MD  2:24 PM

## 2023-05-29 NOTE — Progress Notes (Signed)
  Echocardiogram 2D Echocardiogram has been performed.  Delcie Roch 05/29/2023, 1:45 PM

## 2023-05-31 LAB — T4: T4, Total: 19.5 ug/dL — ABNORMAL HIGH (ref 4.5–12.0)

## 2023-06-03 ENCOUNTER — Telehealth: Payer: Self-pay

## 2023-06-03 ENCOUNTER — Other Ambulatory Visit: Payer: Self-pay

## 2023-06-03 DIAGNOSIS — E039 Hypothyroidism, unspecified: Secondary | ICD-10-CM

## 2023-06-03 NOTE — Telephone Encounter (Signed)
Pt seen results Via my chart . He has a repeat TSH order in and a lab visit schedule

## 2023-06-03 NOTE — Telephone Encounter (Signed)
-----   Message from Neena Rhymes sent at 06/03/2023  7:22 AM EDT ----- It's ok to just stop the Levothyroxine.  He will need a lab only visit in 1 month to recheck TSH (dx hypothyroid)

## 2023-06-03 NOTE — Telephone Encounter (Signed)
Advised pt that per DR Beverely Low pt can stop they levothyroxine and repeat TSH in one month . TSH order is in and lab only visit has been made

## 2023-06-03 NOTE — Telephone Encounter (Signed)
-----   Message from Arvilla Meres sent at 06/03/2023  3:01 PM EDT ----- Thanks. Ill let him know ----- Message ----- From: Sheliah Hatch, MD Sent: 06/03/2023   7:22 AM EDT To: Dolores Patty, MD; [facility]  It's ok to just stop the Levothyroxine.  He will need a lab only visit in 1 month to recheck TSH (dx hypothyroid)

## 2023-06-11 ENCOUNTER — Telehealth (HOSPITAL_COMMUNITY): Payer: Self-pay | Admitting: Pharmacy Technician

## 2023-06-11 ENCOUNTER — Other Ambulatory Visit (HOSPITAL_COMMUNITY): Payer: Self-pay

## 2023-06-11 NOTE — Telephone Encounter (Signed)
Patient Advocate Encounter   Received notification from [employer] that prior authorization for Sherryll Burger is required.   PA submitted on CoverMyMeds Key [id number] Status is pending   Will continue to follow.

## 2023-06-11 NOTE — Progress Notes (Signed)
Remote ICD transmission.   

## 2023-06-12 ENCOUNTER — Other Ambulatory Visit: Payer: Self-pay | Admitting: Family Medicine

## 2023-06-12 ENCOUNTER — Other Ambulatory Visit (HOSPITAL_COMMUNITY): Payer: Self-pay | Admitting: Internal Medicine

## 2023-06-12 NOTE — Telephone Encounter (Signed)
[facility] Patient Advocate Encounter  Prior Authorization for Sherryll Burger has been approved.    PA#  [id number] Effective dates: 06/11/23 through 06/10/24  Archer Asa, CPhT

## 2023-06-12 NOTE — Telephone Encounter (Signed)
Pt is asking for a refill states he takes the medication for his back . Is it ok to refill ?

## 2023-06-20 ENCOUNTER — Ambulatory Visit: Payer: BC Managed Care – PPO | Attending: Cardiology | Admitting: Student

## 2023-06-20 ENCOUNTER — Encounter: Payer: Self-pay | Admitting: Student

## 2023-06-20 ENCOUNTER — Telehealth: Payer: Self-pay | Admitting: Pharmacist

## 2023-06-20 NOTE — Progress Notes (Signed)
Patient ID: Jason Floyd                 DOB: 03-19-1966                    MRN: [id number]      HPI: Jason Floyd is a 57 y.o. male patient referred to pharmacy clinic by Dr. Gala Floyd to initiate weight loss therapy with GLP1-RA. PMH is significant for obesity complicated by chronic medical conditions including severe systolic heart failure due to NICM, DM2, former polysubstance and tobacco abuse. Most recent BMI 30. Most recent A1c 7.0 (06/07/22)  Pt presents to clinic today for weight loss management. Reports he has tried losing weight in the past, but would like medication to help.   Baseline weight/BMI: 30  Insurance payor: [employer]  Diet:  Recently has been eating one large meal between 2-4 PM in afternoon and sometimes smaller snacks later in day. Uses meal kit services and eats out once a week.  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   Family History: mother- multiple TIAs, dementia, died last year Father- MDS, prostate cancer Maternal grandfather- HF  Social History: some alcohol (only drinks about once a month, but has several drinks at a time) Smokes 3-5 cigarettes/day - notes that going to the dog sporting events and stress from work are triggers for him  Labs: Lab Results  Component Value Date   HGBA1C 7.0 (H) 06/07/2022    Wt Readings from Last 1 Encounters:  05/29/23 234 lb 9.6 oz (106.4 kg)    BP Readings from Last 1 Encounters:  05/29/23 96/68   Pulse Readings from Last 1 Encounters:  05/29/23 80       Component Value Date/Time   CHOL 88 05/29/2023 1437   TRIG 181 (H) 05/29/2023 1437   HDL 37 (L) 05/29/2023 1437   CHOLHDL 2.4 05/29/2023 1437   VLDL 36  05/29/2023 1437   LDLCALC 15 05/29/2023 1437   LDLCALC 30 09/15/2021 1506   LDLDIRECT 136.0 01/18/2021 1400    Past Medical History:  Diagnosis Date   AICD (automatic cardioverter/defibrillator) present    Allergy    Arthritis    Atrial fibrillation (HCC)    CHF (congestive heart failure) (HCC)    CHF (congestive heart failure) (HCC)    Diabetes mellitus without complication (HCC)    Drug abuse (HCC)    Eczema    GERD (gastroesophageal reflux disease)    Gout    Heart attack (HCC) 06/10/2020   History of kidney stones    Hyperlipidemia    Hypertension    MI (myocardial infarction) (HCC)    PE (pulmonary embolism)    TIA (transient ischemic attack)    hx of per pt - pt not aware he had not followed by a neurologist   Ventricular tachycardia (HCC)     Current Outpatient Medications on File Prior to Visit  Medication Sig Dispense Refill   allopurinol (ZYLOPRIM) 300 MG tablet TAKE 2 TABLETS (600 MG TOTAL) BY MOUTH DAILY. 60 tablet 6   aspirin EC 81 MG tablet Take 81 mg by mouth daily. Swallow whole.     calcium carbonate (TUMS - DOSED IN MG ELEMENTAL CALCIUM) 500 MG chewable  tablet Chew 2-3 tablets by mouth daily as needed for indigestion or heartburn.     carvedilol (COREG) 25 MG tablet Take 1 tablet (25 mg total) by mouth 2 (two) times daily. 180 tablet 3   cetirizine (ZYRTEC) 10 MG tablet Take 10 mg by mouth daily.     cyclobenzaprine (FLEXERIL) 10 MG tablet Take 10 mg by mouth at bedtime.     ELIQUIS 5 MG TABS tablet TAKE 1 TABLET BY MOUTH 2 TIMES A DAY 60 tablet 11   ENTRESTO 49-51 MG TAKE 1 TABLET BY MOUTH 2 TIMES DAILY. 60 tablet 11   Evolocumab (REPATHA SURECLICK) 140 MG/ML SOAJ Inject 140 mg into the skin every 14 (fourteen) days. 2 mL 11   FARXIGA 10 MG TABS tablet TAKE 1 TABLET BY MOUTH DAILY BEFORE BREAKFAST. 30 tablet 0   fenofibrate 160 MG tablet TAKE 1 TABLET BY MOUTH DAILY. 90 tablet 1   levothyroxine (SYNTHROID) 50 MCG tablet TAKE 1 TABLET (50 MCG TOTAL) BY  MOUTH DAILY 30 MINUTES BEFORE BREAKFAST AND ALL OTHER PILLS. 30 tablet 3   Magnesium Oxide 400 MG CAPS Take 1 capsule (400 mg total) by mouth in the morning and at bedtime. 60 capsule 11   metFORMIN (GLUCOPHAGE) 1000 MG tablet TAKE 1 TABLET BY MOUTH 2 TIMES DAILY WITH A MEAL. 60 tablet 0   methocarbamol (ROBAXIN) 500 MG tablet TAKE 1 TABLET BY MOUTH EVERY 6 HOURS AS NEEDED FOR MUSCLE SPASMS. 90 tablet 0   Naphazoline HCl (CLEAR EYES OP) Place 1 drop into both eyes daily as needed (sore eyes).     pantoprazole (PROTONIX) 40 MG tablet TAKE 1 TABLET BY MOUTH DAILY. 30 tablet 2   sodium chloride (OCEAN) 0.65 % SOLN nasal spray Place 1 spray into both nostrils as needed (dryness).     spironolactone (ALDACTONE) 25 MG tablet TAKE 1 TABLET (25 MG TOTAL) BY MOUTH DAILY. 30 tablet 11   traZODone (DESYREL) 100 MG tablet TAKE 1 TABLET (100 MG TOTAL) BY MOUTH AT BEDTIME. 90 tablet 1   triamcinolone (KENALOG) 0.025 % ointment Apply 1 application. topically daily as needed (irritation). 80 g 3   VASCEPA 1 g capsule TAKE 2 CAPSULES BY MOUTH 2 TIMES DAILY. 120 capsule 3   No current facility-administered medications on file prior to visit.    Not on File   Assessment/Plan:  1. Weight loss - Patient has not met goal of at least 5% of body weight loss with comprehensive lifestyle modifications alone in the past 3-6 months. Pharmacotherapy is appropriate to pursue as augmentation. Will start Mounjaro 2.5 mg once a week pending insurance prior authorization. Confirmed no personal or family history of medullary thyroid carcinoma (MTC) or Multiple Endocrine Neoplasia syndrome type 2 (MEN 2). Advised patient on common side effects including nausea, diarrhea, dyspepsia, decreased appetite, and fatigue. Counseled patient on reducing meal size and how to titrate medication to minimize side effects. Counseled patient to call if intolerable side effects or if experiencing dehydration, abdominal pain, or dizziness. Patient  will adhere to dietary modifications and will target at least 150 minutes of moderate intensity exercise weekly. Counseled patient on smoking cessation and encouraged reducing by 1 cigarette/day per week.   Follow up in 4 weeks.  Adam Phenix, PharmD PGY-1 Pharmacy Resident

## 2023-06-20 NOTE — Patient Instructions (Signed)
GLP1 Agonist Titration Plan:  Will plan to follow the titration plan as below, pending patient is tolerating each dose before increasing to the next. Can slow titration if needed for tolerability.    Ozempic:  -Month 1: Inject Ozempic 0.25 mg SQ once weekly x 4 weeks -Month 2: Inject Ozempic 0.5 mg  SQ once weekly x 4 weeks -Month 3: Inject Ozempic 1 mg SQ once weekly x 4 weeks -Month 4+: Inject Ozempic 2 mg SQ once weekly  Mounjaro:   -Month 1: Inject Mounjaro 2.5 mg SQ once weekly x 4 weeks -Month 2: Inject Mounjaro  5 mg  SQ once weekly x 4 weeks -Month 3: Inject Mounjaro 7.5 mg SQ once weekly x 4 weeks -Month 4: Inject Mounjaro 10 mg SQ once weekly X 4 weeks  -Month 5: Inject Mounjaro  12.5 mg  SQ once weekly x 4 weeks -Month 6+ Inject Mounjaro 15 mg SQ once weekly

## 2023-06-21 ENCOUNTER — Other Ambulatory Visit (HOSPITAL_COMMUNITY): Payer: Self-pay

## 2023-06-21 ENCOUNTER — Telehealth: Payer: Self-pay

## 2023-06-21 MED ORDER — OZEMPIC (0.25 OR 0.5 MG/DOSE) 2 MG/3ML ~~LOC~~ SOPN: 0.2500 mg | PEN_INJECTOR | SUBCUTANEOUS | 0 refills | Status: DC

## 2023-06-21 NOTE — Telephone Encounter (Signed)
Prescription for Ozempic 0.25 mg sent to [facility] per patient's request  Follow up is due in 4 weeks

## 2023-06-21 NOTE — Addendum Note (Signed)
Addended by: Tylene Fantasia on: 06/21/2023 04:30 PM   Modules accepted: Orders

## 2023-06-21 NOTE — Telephone Encounter (Signed)
   Per test claim: No p/a is needed at this time. The cost is around/about 24.99 for 

## 2023-06-25 ENCOUNTER — Encounter: Payer: Self-pay | Admitting: Family Medicine

## 2023-06-25 ENCOUNTER — Encounter (HOSPITAL_COMMUNITY): Payer: Self-pay | Admitting: Internal Medicine

## 2023-07-02 ENCOUNTER — Encounter: Payer: BC Managed Care – PPO | Admitting: Internal Medicine

## 2023-07-02 NOTE — Telephone Encounter (Signed)
No p/a is needed at this time. The cost is around/about 24.99 for , therapy initiated

## 2023-07-04 ENCOUNTER — Other Ambulatory Visit (INDEPENDENT_AMBULATORY_CARE_PROVIDER_SITE_OTHER): Payer: BC Managed Care – PPO

## 2023-07-04 DIAGNOSIS — E039 Hypothyroidism, unspecified: Secondary | ICD-10-CM

## 2023-07-05 ENCOUNTER — Encounter: Payer: Self-pay | Admitting: Family Medicine

## 2023-07-05 ENCOUNTER — Ambulatory Visit: Payer: BC Managed Care – PPO | Admitting: Family Medicine

## 2023-07-05 VITALS — BP 114/76 | HR 84 | Temp 98.2°F | Resp 19 | Ht 74.0 in | Wt 235.0 lb

## 2023-07-05 DIAGNOSIS — E669 Obesity, unspecified: Secondary | ICD-10-CM | POA: Diagnosis not present

## 2023-07-05 DIAGNOSIS — Z7984 Long term (current) use of oral hypoglycemic drugs: Secondary | ICD-10-CM

## 2023-07-05 DIAGNOSIS — M5412 Radiculopathy, cervical region: Secondary | ICD-10-CM | POA: Diagnosis not present

## 2023-07-05 DIAGNOSIS — Z6830 Body mass index (BMI) 30.0-30.9, adult: Secondary | ICD-10-CM

## 2023-07-05 DIAGNOSIS — E118 Type 2 diabetes mellitus with unspecified complications: Secondary | ICD-10-CM | POA: Diagnosis not present

## 2023-07-05 LAB — BASIC METABOLIC PANEL
BUN: 16 mg/dL (ref 6–23)
CO2: 25 mEq/L (ref 19–32)
Calcium: 10 mg/dL (ref 8.4–10.5)
Chloride: 103 mEq/L (ref 96–112)
Creatinine, Ser: 1.14 mg/dL (ref 0.40–1.50)
GFR: 70.8 mL/min (ref >60.00)
Glucose, Bld: 173 mg/dL — ABNORMAL HIGH (ref 70–99)
Potassium: 4.6 mEq/L (ref 3.5–5.1)
Sodium: 138 mEq/L (ref 135–145)

## 2023-07-05 LAB — MICROALBUMIN / CREATININE URINE RATIO
Creatinine,U: 18.9 mg/dL
Microalb Creat Ratio: 3.7 mg/g (ref 0.0–30.0)
Microalb, Ur: <0.7 mg/dL (ref 0.0–1.9)

## 2023-07-05 LAB — TSH: TSH: 0.03 u[IU]/mL — ABNORMAL LOW (ref 0.35–5.50)

## 2023-07-05 LAB — HEMOGLOBIN A1C: Hgb A1c MFr Bld: 8.5 % — ABNORMAL HIGH (ref 4.6–6.5)

## 2023-07-05 NOTE — Patient Instructions (Addendum)
Schedule your complete physical in 6 months We'll notify you of your lab results and make any changes STOP the Metformin CONTINUE the Ozempic.  After 4 weeks of 0.25mg  increase to the 0.5mg  dose weekly We'll call you to schedule your Neurosurg appt Call with any questions or concerns Stay Safe!  Stay Healthy! Happy Labor Day!!!

## 2023-07-05 NOTE — Progress Notes (Unsigned)
   Subjective:    Patient ID: Jason Floyd, male    DOB: 10/22/1966, 57 y.o.   MRN: [id number]  HPI DM- UTD on eye exam, foot exam.  Due for A1C and microalbumin.  Cards started him on Ozempic a week ago.  Currently on the 0.25mg  weekly dose.  Cardiology prefers he stop Metformin.  Some fatigue.  No CP, SOB, HA's, visual changes, abd pain, N/V.    Obesity- pt is down 8 lbs since June.  Started Ozempic.  Neck pain- has been having neck pain radiating into L arm and shoulder blade for last 3 weeks.  pt reports numbness/tingling in L arm.  Hx of discectomy and fusion.     Review of Systems For ROS see HPI     Objective:   Physical Exam Vitals reviewed.  Constitutional:      General: He is not in acute distress.    Appearance: Normal appearance. He is well-developed. He is not ill-appearing.  HENT:     Head: Normocephalic and atraumatic.  Eyes:     Extraocular Movements: Extraocular movements intact.     Conjunctiva/sclera: Conjunctivae normal.     Pupils: Pupils are equal, round, and reactive to light.  Neck:     Thyroid: No thyromegaly.  Cardiovascular:     Rate and Rhythm: Normal rate and regular rhythm.     Pulses: Normal pulses.     Heart sounds: Normal heart sounds. No murmur heard. Pulmonary:     Effort: Pulmonary effort is normal. No respiratory distress.     Breath sounds: Normal breath sounds.  Abdominal:     General: Bowel sounds are normal. There is no distension.     Palpations: Abdomen is soft.  Musculoskeletal:     Cervical back: Normal range of motion and neck supple.     Right lower leg: No edema.     Left lower leg: No edema.  Lymphadenopathy:     Cervical: No cervical adenopathy.  Skin:    General: Skin is warm and dry.  Neurological:     General: No focal deficit present.     Mental Status: He is alert and oriented to person, place, and time.     Cranial Nerves: No cranial nerve deficit.  Psychiatric:        Mood and Affect: Mood normal.         Behavior: Behavior normal.           Assessment & Plan:

## 2023-07-08 ENCOUNTER — Encounter: Payer: Self-pay | Admitting: Family Medicine

## 2023-07-08 ENCOUNTER — Telehealth: Payer: Self-pay

## 2023-07-08 ENCOUNTER — Other Ambulatory Visit: Payer: Self-pay

## 2023-07-08 DIAGNOSIS — R7989 Other specified abnormal findings of blood chemistry: Secondary | ICD-10-CM

## 2023-07-08 NOTE — Telephone Encounter (Signed)
Lab only visit  TSH future ordered

## 2023-07-08 NOTE — Telephone Encounter (Signed)
Pt seen results Via my chart  

## 2023-07-08 NOTE — Telephone Encounter (Signed)
We will need to repeat his TSH at a lab only visit in 1 month (dx low TSH) to see if this has returned to normal

## 2023-07-08 NOTE — Telephone Encounter (Signed)
-----   Message from Neena Rhymes sent at 07/08/2023  4:09 PM EDT ----- Your TSH remains low.  Have you stopped your Levothyroxine?  Please let me know so we can determine the next steps

## 2023-07-08 NOTE — Telephone Encounter (Signed)
-----   Message from Neena Rhymes sent at 07/08/2023  3:58 PM EDT ----- A1C has jumped from 7 --> 8.5  This indicates poor sugar control.  Continue the [place] 10mg  daily.  Continue the Semaglutide and increase the dose to 0.5mg  after 4 injections.  STOP the Metformin as we discussed.  And PLEASE work on low carb, low sugar diet w/ regular physical activity/exercise.

## 2023-07-09 NOTE — Assessment & Plan Note (Signed)
Pt has hx of similar.  Has had surgery w/ [facility] in the past.  Now having neck pain radiating into L arm w/ numbness and tingling.  Refer back to Neurosurg for complete evaluation and tx.  Pt expressed understanding and is in agreement w/ plan.

## 2023-07-09 NOTE — Assessment & Plan Note (Signed)
Chronic problem.  Currently on Farxiga and Metformin.  Cards started Ozempic 1 week ago.  Due to CHF, will stop Metformin.  UTD on eye exam and foot exam.  Due for A1C and microalbumin.  Check labs.  Adjust meds prn.

## 2023-07-09 NOTE — Assessment & Plan Note (Signed)
Ongoing issue for pt.  Is down 8 lbs since June.  He started Ozempic last week and will hopefully lose additional weight.  Will continue to follow.

## 2023-07-10 ENCOUNTER — Other Ambulatory Visit: Payer: Self-pay | Admitting: Family Medicine

## 2023-07-16 ENCOUNTER — Inpatient Hospital Stay (HOSPITAL_COMMUNITY)
Admission: EM | Admit: 2023-07-16 | Discharge: 2023-07-19 | DRG: 309 | Disposition: A | Discharge status: Home / Self Care | Payer: BC Managed Care – PPO | Attending: Cardiology | Admitting: Cardiology

## 2023-07-16 ENCOUNTER — Telehealth: Payer: Self-pay

## 2023-07-16 ENCOUNTER — Emergency Department (HOSPITAL_COMMUNITY): Payer: BC Managed Care – PPO

## 2023-07-16 ENCOUNTER — Encounter (HOSPITAL_COMMUNITY): Payer: Self-pay

## 2023-07-16 DIAGNOSIS — I472 Ventricular tachycardia, unspecified: Secondary | ICD-10-CM | POA: Diagnosis not present

## 2023-07-16 DIAGNOSIS — I4819 Other persistent atrial fibrillation: Secondary | ICD-10-CM | POA: Diagnosis present

## 2023-07-16 DIAGNOSIS — I2510 Atherosclerotic heart disease of native coronary artery without angina pectoris: Secondary | ICD-10-CM | POA: Diagnosis present

## 2023-07-16 DIAGNOSIS — Z8673 Personal history of transient ischemic attack (TIA), and cerebral infarction without residual deficits: Secondary | ICD-10-CM

## 2023-07-16 DIAGNOSIS — I255 Ischemic cardiomyopathy: Secondary | ICD-10-CM | POA: Diagnosis present

## 2023-07-16 DIAGNOSIS — Z4502 Encounter for adjustment and management of automatic implantable cardiac defibrillator: Principal | ICD-10-CM

## 2023-07-16 DIAGNOSIS — Z7984 Long term (current) use of oral hypoglycemic drugs: Secondary | ICD-10-CM

## 2023-07-16 DIAGNOSIS — Z7982 Long term (current) use of aspirin: Secondary | ICD-10-CM

## 2023-07-16 DIAGNOSIS — E039 Hypothyroidism, unspecified: Secondary | ICD-10-CM | POA: Diagnosis present

## 2023-07-16 DIAGNOSIS — I110 Hypertensive heart disease with heart failure: Secondary | ICD-10-CM | POA: Diagnosis present

## 2023-07-16 DIAGNOSIS — I428 Other cardiomyopathies: Secondary | ICD-10-CM | POA: Diagnosis present

## 2023-07-16 DIAGNOSIS — I2582 Chronic total occlusion of coronary artery: Secondary | ICD-10-CM | POA: Diagnosis present

## 2023-07-16 DIAGNOSIS — K219 Gastro-esophageal reflux disease without esophagitis: Secondary | ICD-10-CM | POA: Diagnosis present

## 2023-07-16 DIAGNOSIS — Z86711 Personal history of pulmonary embolism: Secondary | ICD-10-CM

## 2023-07-16 DIAGNOSIS — Z823 Family history of stroke: Secondary | ICD-10-CM

## 2023-07-16 DIAGNOSIS — F17210 Nicotine dependence, cigarettes, uncomplicated: Secondary | ICD-10-CM | POA: Diagnosis present

## 2023-07-16 DIAGNOSIS — E0590 Thyrotoxicosis, unspecified without thyrotoxic crisis or storm: Secondary | ICD-10-CM | POA: Diagnosis present

## 2023-07-16 DIAGNOSIS — Z7901 Long term (current) use of anticoagulants: Secondary | ICD-10-CM

## 2023-07-16 DIAGNOSIS — Z803 Family history of malignant neoplasm of breast: Secondary | ICD-10-CM

## 2023-07-16 DIAGNOSIS — I9589 Other hypotension: Secondary | ICD-10-CM | POA: Diagnosis not present

## 2023-07-16 DIAGNOSIS — Z95810 Presence of automatic (implantable) cardiac defibrillator: Secondary | ICD-10-CM

## 2023-07-16 DIAGNOSIS — Z818 Family history of other mental and behavioral disorders: Secondary | ICD-10-CM

## 2023-07-16 DIAGNOSIS — I252 Old myocardial infarction: Secondary | ICD-10-CM

## 2023-07-16 DIAGNOSIS — Z7985 Long-term (current) use of injectable non-insulin antidiabetic drugs: Secondary | ICD-10-CM

## 2023-07-16 DIAGNOSIS — Z96651 Presence of right artificial knee joint: Secondary | ICD-10-CM | POA: Diagnosis present

## 2023-07-16 DIAGNOSIS — M109 Gout, unspecified: Secondary | ICD-10-CM | POA: Diagnosis present

## 2023-07-16 DIAGNOSIS — E785 Hyperlipidemia, unspecified: Secondary | ICD-10-CM | POA: Diagnosis present

## 2023-07-16 DIAGNOSIS — I5022 Chronic systolic (congestive) heart failure: Secondary | ICD-10-CM | POA: Diagnosis present

## 2023-07-16 DIAGNOSIS — E119 Type 2 diabetes mellitus without complications: Secondary | ICD-10-CM | POA: Diagnosis present

## 2023-07-16 DIAGNOSIS — Z79899 Other long term (current) drug therapy: Secondary | ICD-10-CM

## 2023-07-16 NOTE — ED Triage Notes (Signed)
Pt is coming in after his defib has fired 3 times in 24hrs house, he states he has been working hard over the weekend and has been exhausted but the first time it fired it was when he was sleeping, the clinic called him and told him it fired and if it happened again in a 24hr period that he should come here. It has fired twice since then. He has no chest pain or shortness of breath and is otherwise stable at this time.

## 2023-07-16 NOTE — Telephone Encounter (Signed)
Office visit tomorrow

## 2023-07-16 NOTE — Telephone Encounter (Signed)
Patient reports of a busy weekend with very little sleep. States he did experience several episodes of dizziness. Patient advised of [place] DMV driving restrictions x6 months. Pt voiced understanding. Has apt with Dr. Graciela Husbands 07/17/23. Pt aware and shock plan reviewed. Complaint with meds on file as listed below.  Coreg 25 mg BID Eliquis 5 mg BID Entreso 49-51 mg BID

## 2023-07-16 NOTE — Telephone Encounter (Signed)
Following alert received from [employer] received for Ventricular shock therapy delivered to convert arrhythmia. Event occurred 9/1 @ 01:10, duration 52sec V rate 247 bpm.  EGM shows AF with abrupt onset of sustained VT falling into the VF zone.  HV therapy of 41J delivered converting to AF/VS There are 3 additional NSVT  @ 01:09, 15:45, & 17:24.   Attempted to contact patient. No answer, LMTCB.

## 2023-07-17 ENCOUNTER — Inpatient Hospital Stay (HOSPITAL_COMMUNITY): Payer: BC Managed Care – PPO

## 2023-07-17 ENCOUNTER — Other Ambulatory Visit: Payer: Self-pay

## 2023-07-17 ENCOUNTER — Ambulatory Visit: Payer: BC Managed Care – PPO | Admitting: Internal Medicine

## 2023-07-17 DIAGNOSIS — Z95810 Presence of automatic (implantable) cardiac defibrillator: Secondary | ICD-10-CM | POA: Diagnosis not present

## 2023-07-17 DIAGNOSIS — Z7982 Long term (current) use of aspirin: Secondary | ICD-10-CM | POA: Diagnosis not present

## 2023-07-17 DIAGNOSIS — Z4502 Encounter for adjustment and management of automatic implantable cardiac defibrillator: Secondary | ICD-10-CM

## 2023-07-17 DIAGNOSIS — Z7901 Long term (current) use of anticoagulants: Secondary | ICD-10-CM | POA: Diagnosis not present

## 2023-07-17 DIAGNOSIS — I255 Ischemic cardiomyopathy: Secondary | ICD-10-CM | POA: Diagnosis present

## 2023-07-17 DIAGNOSIS — I2510 Atherosclerotic heart disease of native coronary artery without angina pectoris: Secondary | ICD-10-CM

## 2023-07-17 DIAGNOSIS — Z7984 Long term (current) use of oral hypoglycemic drugs: Secondary | ICD-10-CM | POA: Diagnosis not present

## 2023-07-17 DIAGNOSIS — E785 Hyperlipidemia, unspecified: Secondary | ICD-10-CM | POA: Diagnosis present

## 2023-07-17 DIAGNOSIS — I4819 Other persistent atrial fibrillation: Secondary | ICD-10-CM

## 2023-07-17 DIAGNOSIS — I472 Ventricular tachycardia, unspecified: Principal | ICD-10-CM

## 2023-07-17 DIAGNOSIS — I5022 Chronic systolic (congestive) heart failure: Secondary | ICD-10-CM | POA: Diagnosis present

## 2023-07-17 DIAGNOSIS — I340 Nonrheumatic mitral (valve) insufficiency: Secondary | ICD-10-CM | POA: Diagnosis not present

## 2023-07-17 DIAGNOSIS — I2582 Chronic total occlusion of coronary artery: Secondary | ICD-10-CM | POA: Diagnosis present

## 2023-07-17 DIAGNOSIS — Z823 Family history of stroke: Secondary | ICD-10-CM | POA: Diagnosis not present

## 2023-07-17 DIAGNOSIS — I428 Other cardiomyopathies: Secondary | ICD-10-CM | POA: Diagnosis present

## 2023-07-17 DIAGNOSIS — I4891 Unspecified atrial fibrillation: Secondary | ICD-10-CM | POA: Diagnosis not present

## 2023-07-17 DIAGNOSIS — I480 Paroxysmal atrial fibrillation: Secondary | ICD-10-CM | POA: Diagnosis not present

## 2023-07-17 DIAGNOSIS — E119 Type 2 diabetes mellitus without complications: Secondary | ICD-10-CM

## 2023-07-17 DIAGNOSIS — E0590 Thyrotoxicosis, unspecified without thyrotoxic crisis or storm: Secondary | ICD-10-CM | POA: Diagnosis present

## 2023-07-17 DIAGNOSIS — I9589 Other hypotension: Secondary | ICD-10-CM | POA: Diagnosis not present

## 2023-07-17 DIAGNOSIS — I110 Hypertensive heart disease with heart failure: Secondary | ICD-10-CM | POA: Diagnosis present

## 2023-07-17 DIAGNOSIS — I959 Hypotension, unspecified: Secondary | ICD-10-CM | POA: Diagnosis not present

## 2023-07-17 DIAGNOSIS — M109 Gout, unspecified: Secondary | ICD-10-CM | POA: Diagnosis present

## 2023-07-17 DIAGNOSIS — E039 Hypothyroidism, unspecified: Secondary | ICD-10-CM

## 2023-07-17 DIAGNOSIS — I252 Old myocardial infarction: Secondary | ICD-10-CM | POA: Diagnosis not present

## 2023-07-17 DIAGNOSIS — Z96651 Presence of right artificial knee joint: Secondary | ICD-10-CM | POA: Diagnosis present

## 2023-07-17 DIAGNOSIS — Z86711 Personal history of pulmonary embolism: Secondary | ICD-10-CM | POA: Diagnosis not present

## 2023-07-17 DIAGNOSIS — Z79899 Other long term (current) drug therapy: Secondary | ICD-10-CM | POA: Diagnosis not present

## 2023-07-17 DIAGNOSIS — F17210 Nicotine dependence, cigarettes, uncomplicated: Secondary | ICD-10-CM | POA: Diagnosis present

## 2023-07-17 LAB — CBC
HCT: 48 % (ref 39.0–52.0)
Hemoglobin: 16 g/dL (ref 13.0–17.0)
MCH: 30.5 pg (ref 26.0–34.0)
MCHC: 33.3 g/dL (ref 30.0–36.0)
MCV: 91.4 fL (ref 80.0–100.0)
Platelets: 282 10*3/uL (ref 150–400)
RBC: 5.25 MIL/uL (ref 4.22–5.81)
RDW: 12.6 % (ref 11.5–15.5)
WBC: 10 10*3/uL (ref 4.0–10.5)
nRBC: 0 % (ref 0.0–0.2)

## 2023-07-17 LAB — BASIC METABOLIC PANEL
Anion gap: 11 (ref 5–15)
BUN: 18 mg/dL (ref 6–20)
CO2: 24 mmol/L (ref 22–32)
Calcium: 9.2 mg/dL (ref 8.9–10.3)
Chloride: 102 mmol/L (ref 98–111)
Creatinine, Ser: 1.27 mg/dL — ABNORMAL HIGH (ref 0.61–1.24)
GFR, Estimated: >60 mL/min (ref >60)
Glucose, Bld: 303 mg/dL — ABNORMAL HIGH (ref 70–99)
Potassium: 3.8 mmol/L (ref 3.5–5.1)
Sodium: 137 mmol/L (ref 135–145)

## 2023-07-17 LAB — ECHOCARDIOGRAM LIMITED
Height: 74 in
S' Lateral: 4.3 cm
Weight: 3712 [oz_av]

## 2023-07-17 LAB — CBC WITH DIFFERENTIAL/PLATELET
Abs Immature Granulocytes: 0.06 10*3/uL (ref 0.00–0.07)
Basophils Absolute: 0.1 10*3/uL (ref 0.0–0.1)
Basophils Relative: 1 %
Eosinophils Absolute: 0.2 10*3/uL (ref 0.0–0.5)
Eosinophils Relative: 2 %
HCT: 44.8 % (ref 39.0–52.0)
Hemoglobin: 15 g/dL (ref 13.0–17.0)
Immature Granulocytes: 1 %
Lymphocytes Relative: 44 %
Lymphs Abs: 4.2 10*3/uL — ABNORMAL HIGH (ref 0.7–4.0)
MCH: 30.5 pg (ref 26.0–34.0)
MCHC: 33.5 g/dL (ref 30.0–36.0)
MCV: 91.2 fL (ref 80.0–100.0)
Monocytes Absolute: 0.7 10*3/uL (ref 0.1–1.0)
Monocytes Relative: 7 %
Neutro Abs: 4.4 10*3/uL (ref 1.7–7.7)
Neutrophils Relative %: 45 %
Platelets: 237 10*3/uL (ref 150–400)
RBC: 4.91 MIL/uL (ref 4.22–5.81)
RDW: 12.8 % (ref 11.5–15.5)
WBC: 9.6 10*3/uL (ref 4.0–10.5)
nRBC: 0 % (ref 0.0–0.2)

## 2023-07-17 LAB — CBG MONITORING, ED
Glucose-Capillary: 233 mg/dL — ABNORMAL HIGH (ref 70–99)
Glucose-Capillary: 179 mg/dL — ABNORMAL HIGH (ref 70–99)
Glucose-Capillary: 185 mg/dL — ABNORMAL HIGH (ref 70–99)

## 2023-07-17 LAB — COMPREHENSIVE METABOLIC PANEL
ALT: 27 U/L (ref 0–44)
AST: 15 U/L (ref 15–41)
Albumin: 3.6 g/dL (ref 3.5–5.0)
Alkaline Phosphatase: 42 U/L (ref 38–126)
Anion gap: 11 (ref 5–15)
BUN: 16 mg/dL (ref 6–20)
CO2: 25 mmol/L (ref 22–32)
Calcium: 9 mg/dL (ref 8.9–10.3)
Chloride: 101 mmol/L (ref 98–111)
Creatinine, Ser: 1.27 mg/dL — ABNORMAL HIGH (ref 0.61–1.24)
GFR, Estimated: >60 mL/min (ref >60)
Glucose, Bld: 217 mg/dL — ABNORMAL HIGH (ref 70–99)
Potassium: 4.3 mmol/L (ref 3.5–5.1)
Sodium: 137 mmol/L (ref 135–145)
Total Bilirubin: 0.4 mg/dL (ref 0.3–1.2)
Total Protein: 6 g/dL — ABNORMAL LOW (ref 6.5–8.1)

## 2023-07-17 LAB — TROPONIN I (HIGH SENSITIVITY)
Troponin I (High Sensitivity): 82 ng/L — ABNORMAL HIGH (ref <18)
Troponin I (High Sensitivity): 87 ng/L — ABNORMAL HIGH (ref <18)

## 2023-07-17 LAB — GLUCOSE, CAPILLARY
Glucose-Capillary: 182 mg/dL — ABNORMAL HIGH (ref 70–99)
Glucose-Capillary: 180 mg/dL — ABNORMAL HIGH (ref 70–99)

## 2023-07-17 LAB — MAGNESIUM
Magnesium: 2.1 mg/dL (ref 1.7–2.4)
Magnesium: 2 mg/dL (ref 1.7–2.4)

## 2023-07-17 LAB — HIV ANTIBODY (ROUTINE TESTING W REFLEX): HIV Screen 4th Generation wRfx: NONREACTIVE

## 2023-07-17 LAB — TSH: TSH: 0.023 u[IU]/mL — ABNORMAL LOW (ref 0.350–4.500)

## 2023-07-17 LAB — TYPE AND SCREEN
ABO/RH(D): AB POS
Antibody Screen: NEGATIVE

## 2023-07-17 LAB — T4, FREE: Free T4: 2.53 ng/dL — ABNORMAL HIGH (ref 0.61–1.12)

## 2023-07-17 MED ORDER — MEXILETINE HCL 150 MG PO CAPS
150.0000 mg | ORAL_CAPSULE | Freq: Three times a day (TID) | ORAL | Status: DC
  Administered 2023-07-17 – 2023-07-18 (×3): 150 mg via ORAL
  Filled 2023-07-17 (×4): qty 1

## 2023-07-17 MED ORDER — FENOFIBRATE 160 MG PO TABS
160.0000 mg | ORAL_TABLET | Freq: Every day | ORAL | Status: DC
  Administered 2023-07-17 – 2023-07-19 (×3): 160 mg via ORAL
  Filled 2023-07-17 (×3): qty 1

## 2023-07-17 MED ORDER — ICOSAPENT ETHYL 1 G PO CAPS
2.0000 g | ORAL_CAPSULE | Freq: Two times a day (BID) | ORAL | Status: DC
  Administered 2023-07-17 – 2023-07-19 (×5): 2 g via ORAL
  Filled 2023-07-17 (×6): qty 2

## 2023-07-17 MED ORDER — ATORVASTATIN CALCIUM 80 MG PO TABS
80.0000 mg | ORAL_TABLET | Freq: Every day | ORAL | Status: DC
  Administered 2023-07-17 – 2023-07-19 (×3): 80 mg via ORAL
  Filled 2023-07-17: qty 2
  Filled 2023-07-17: qty 1
  Filled 2023-07-17: qty 2

## 2023-07-17 MED ORDER — ASPIRIN 81 MG PO TBEC
81.0000 mg | DELAYED_RELEASE_TABLET | Freq: Every day | ORAL | Status: DC
  Administered 2023-07-17 – 2023-07-19 (×3): 81 mg via ORAL
  Filled 2023-07-17 (×3): qty 1

## 2023-07-17 MED ORDER — INSULIN ASPART 100 UNIT/ML IJ SOLN
0.0000 [IU] | Freq: Three times a day (TID) | INTRAMUSCULAR | Status: DC
  Administered 2023-07-18: 2 [IU] via SUBCUTANEOUS
  Administered 2023-07-18: 4 [IU] via SUBCUTANEOUS

## 2023-07-17 MED ORDER — TRAZODONE HCL 50 MG PO TABS
100.0000 mg | ORAL_TABLET | Freq: Every day | ORAL | Status: DC
  Administered 2023-07-17 – 2023-07-18 (×2): 100 mg via ORAL
  Filled 2023-07-17: qty 2
  Filled 2023-07-17: qty 1

## 2023-07-17 MED ORDER — AMIODARONE LOAD VIA INFUSION
150.0000 mg | Freq: Once | INTRAVENOUS | Status: AC
  Administered 2023-07-17: 150 mg via INTRAVENOUS
  Filled 2023-07-17: qty 83.34

## 2023-07-17 MED ORDER — MAGNESIUM OXIDE -MG SUPPLEMENT 400 (240 MG) MG PO TABS
400.0000 mg | ORAL_TABLET | Freq: Two times a day (BID) | ORAL | Status: DC
  Administered 2023-07-17 – 2023-07-19 (×4): 400 mg via ORAL
  Filled 2023-07-17 (×4): qty 1

## 2023-07-17 MED ORDER — CARVEDILOL 25 MG PO TABS
25.0000 mg | ORAL_TABLET | Freq: Two times a day (BID) | ORAL | Status: DC
  Administered 2023-07-17 – 2023-07-18 (×3): 25 mg via ORAL
  Filled 2023-07-17 (×2): qty 1
  Filled 2023-07-17: qty 2

## 2023-07-17 MED ORDER — ALLOPURINOL 300 MG PO TABS
600.0000 mg | ORAL_TABLET | Freq: Every day | ORAL | Status: DC
  Administered 2023-07-17 – 2023-07-19 (×3): 600 mg via ORAL
  Filled 2023-07-17: qty 6
  Filled 2023-07-17 (×2): qty 2

## 2023-07-17 MED ORDER — SACUBITRIL-VALSARTAN 49-51 MG PO TABS
1.0000 | ORAL_TABLET | Freq: Two times a day (BID) | ORAL | Status: DC
  Administered 2023-07-17 – 2023-07-18 (×3): 1 via ORAL
  Filled 2023-07-17 (×4): qty 1

## 2023-07-17 MED ORDER — POTASSIUM CHLORIDE CRYS ER 20 MEQ PO TBCR
40.0000 meq | EXTENDED_RELEASE_TABLET | Freq: Once | ORAL | Status: AC
  Administered 2023-07-17: 40 meq via ORAL
  Filled 2023-07-17: qty 2

## 2023-07-17 MED ORDER — AMIODARONE HCL IN DEXTROSE 360-4.14 MG/200ML-% IV SOLN
60.0000 mg/h | INTRAVENOUS | Status: DC
  Administered 2023-07-17 (×2): 60 mg/h via INTRAVENOUS
  Filled 2023-07-17 (×2): qty 200

## 2023-07-17 MED ORDER — PANTOPRAZOLE SODIUM 40 MG PO TBEC
40.0000 mg | DELAYED_RELEASE_TABLET | Freq: Every day | ORAL | Status: DC
  Administered 2023-07-17 – 2023-07-19 (×3): 40 mg via ORAL
  Filled 2023-07-17 (×3): qty 1

## 2023-07-17 MED ORDER — AMIODARONE HCL IN DEXTROSE 360-4.14 MG/200ML-% IV SOLN
30.0000 mg/h | INTRAVENOUS | Status: DC
  Administered 2023-07-17: 30 mg/h via INTRAVENOUS

## 2023-07-17 MED ORDER — APIXABAN 5 MG PO TABS
5.0000 mg | ORAL_TABLET | Freq: Two times a day (BID) | ORAL | Status: DC
  Administered 2023-07-17 (×2): 5 mg via ORAL
  Filled 2023-07-17 (×3): qty 1

## 2023-07-17 MED ORDER — CALCIUM CARBONATE ANTACID 500 MG PO CHEW: 2.0000 | CHEWABLE_TABLET | Freq: Every day | ORAL | Status: DC | PRN

## 2023-07-17 MED ORDER — SPIRONOLACTONE 25 MG PO TABS
25.0000 mg | ORAL_TABLET | Freq: Every day | ORAL | Status: DC
  Administered 2023-07-17 – 2023-07-18 (×2): 25 mg via ORAL
  Filled 2023-07-17: qty 2
  Filled 2023-07-17: qty 1

## 2023-07-17 NOTE — ED Notes (Signed)
Requested Mexitil from pharmacy

## 2023-07-17 NOTE — ED Notes (Signed)
Pt eating lunch

## 2023-07-17 NOTE — H&P (Signed)
Cardiology Admission History and Physical   Patient ID: Jason Floyd MRN: [id number]; DOB: 1966/04/28   Admission date: 07/16/2023  PCP:  Sheliah Hatch, MD   [facility] Providers Cardiologist:  None  Electrophysiologist:  Sherryl Manges, MD  Advanced Heart Failure:  Arvilla Meres, MD       Chief Complaint:  ICD shocks  Patient Profile:   Jason Floyd is a 57 y.o. male with a PMH of NICM (EF = 35-40%), VT s/p [employer] DC-ICD, HTN, HLD, PAF (on Eliquis), TIA, DMII, Gout, hypothyroidism and prior substance abuse who is being seen 07/17/2023 for the evaluation of ICD shocks.  History of Present Illness:   Jason Floyd was in his USOH until yesterday when he was notified by his primary cardiologist office that he had been shocked by his ICD. He states that he was sleeping when this happened and didn't notice. Later yesterday evening around 9:30 pm the patient was folding clothes when he suddenly felt dizzy and got shocked for a second time. ~1 hr later he had the same symptoms and was shocked for a 3rd time. Over the past several days he has noticed that he has felt a bit more fatigued, but otherwise has been asymptomatic until now. He denies chest pain, SOB, PND, orthopnea, swelling, syncope, palpitations, diaphoresis, fevers, chills, N/V/D, abdominal pain and urinary changes. Given his multiple shocks he presented to the ED for evaluation.   In the ED his VS were afebrile, BP 116/78, HR 104, RR 17 and satting 97% on RA. Labs notable for sCr 1.27, glucose 303, troponins 87 -> 82, TSH low and FT4 high. CXR was unremarkable. EKG showed Afib. The patient was asymptomatic on my examination.  I performed an interrogation of the patient's ICD. His interrogation shows that he is currently in Afib. His parameters are: VT monitoring zone - 170, VT treatment zone - 200 and VF treatment zone - 240. He had one shock for tachycardia on 9/1, an episode of tachycardia with ATP on 9/2,  and 3 episodes of tachycardia on 9/3, two ow which ATP was tried then the tachycardia was terminated by shocks.    Past Medical History:  Diagnosis Date   AICD (automatic cardioverter/defibrillator) present    Allergy    Arthritis    Atrial fibrillation (HCC)    CHF (congestive heart failure) (HCC)    CHF (congestive heart failure) (HCC)    Diabetes mellitus without complication (HCC)    Drug abuse (HCC)    Eczema    GERD (gastroesophageal reflux disease)    Gout    Heart attack (HCC) 06/10/2020   History of kidney stones    Hyperlipidemia    Hypertension    MI (myocardial infarction) (HCC)    PE (pulmonary embolism)    TIA (transient ischemic attack)    hx of per pt - pt not aware he had not followed by a neurologist   Ventricular tachycardia [facility])     Past Surgical History:  Procedure Laterality Date   CERVICAL FUSION     ICD IMPLANT N/A 08/22/2020   Procedure: ICD IMPLANT;  Surgeon: Duke Salvia, MD;  Location: [facility] INVASIVE CV LAB;  Service: Cardiovascular;  Laterality: N/A;   KNEE ARTHROSCOPY Right 10/28/2020   Procedure: RIGHT KNEE ARTHROSCOPY, PARTIAL MEDIAL MENISCECTOMY, CHONDROPLASTY;  Surgeon: Marcene Corning, MD;  Location: [facility] ORS;  Service: Orthopedics;  Laterality: Right;   left thumb surgery      RIGHT/LEFT HEART CATH AND CORONARY ANGIOGRAPHY  N/A 07/01/2020   Procedure: RIGHT/LEFT HEART CATH AND CORONARY ANGIOGRAPHY;  Surgeon: Dolores Patty, MD;  Location: [facility] INVASIVE CV LAB;  Service: Cardiovascular;  Laterality: N/A;   SHOULDER ARTHROSCOPY Left    SHOULDER ARTHROSCOPY WITH ROTATOR CUFF REPAIR AND SUBACROMIAL DECOMPRESSION Right 06/14/2022   Procedure: SHOULDER ARTHROSCOPY WITH ROTATOR CUFF REPAIR, SUBACROMIAL DECOMPRESSION AND TENOTOMY;  Surgeon: Jones Broom, MD;  Location: [facility] ORS;  Service: Orthopedics;  Laterality: Right;   TOTAL KNEE ARTHROPLASTY Right 10/03/2021   Procedure: RIGHT TOTAL KNEE ARTHROPLASTY;  Surgeon: Marcene Corning, MD;  Location:  [facility] ORS;  Service: Orthopedics;  Laterality: Right;   VASECTOMY Bilateral    WISDOM TOOTH EXTRACTION       Medications Prior to Admission: Prior to Admission medications   Medication Sig Start Date End Date Taking? Authorizing Provider  allopurinol (ZYLOPRIM) 300 MG tablet TAKE 2 TABLETS (600 MG TOTAL) BY MOUTH DAILY. 01/02/23   Sheliah Hatch, MD  aspirin EC 81 MG tablet Take 81 mg by mouth daily. Swallow whole.    [provider]  calcium carbonate (TUMS - DOSED IN MG ELEMENTAL CALCIUM) 500 MG chewable tablet Chew 2-3 tablets by mouth daily as needed for indigestion or heartburn.    [provider]  carvedilol (COREG) 25 MG tablet Take 1 tablet (25 mg total) by mouth 2 (two) times daily. 01/01/23   Graciella Freer, PA-C  cetirizine (ZYRTEC) 10 MG tablet Take 10 mg by mouth daily.    [provider]  cyclobenzaprine (FLEXERIL) 10 MG tablet TAKE 1 TABLET BY MOUTH 3 TIMES DAILY AS NEEDED FOR MUSCLE SPASMS. 07/10/23   Sheliah Hatch, MD  ELIQUIS 5 MG TABS tablet TAKE 1 TABLET BY MOUTH 2 TIMES A DAY 09/04/22   Bensimhon, Bevelyn Buckles, MD  ENTRESTO 49-51 MG TAKE 1 TABLET BY MOUTH 2 TIMES DAILY. 02/04/23   Bensimhon, Bevelyn Buckles, MD  Evolocumab (REPATHA SURECLICK) 140 MG/ML SOAJ Inject 140 mg into the skin every 14 (fourteen) days. 02/04/23   Bensimhon, Bevelyn Buckles, MD  FARXIGA 10 MG TABS tablet TAKE 1 TABLET BY MOUTH DAILY BEFORE BREAKFAST. 07/10/23   Sheliah Hatch, MD  fenofibrate 160 MG tablet TAKE 1 TABLET BY MOUTH DAILY. 05/20/23   Sheliah Hatch, MD  Magnesium Oxide 400 MG CAPS Take 1 capsule (400 mg total) by mouth in the morning and at bedtime. 01/02/23   Graciella Freer, PA-C  metFORMIN (GLUCOPHAGE) 1000 MG tablet TAKE 1 TABLET BY MOUTH 2 TIMES DAILY WITH A MEAL. 06/12/23   Sheliah Hatch, MD  methocarbamol (ROBAXIN) 500 MG tablet TAKE 1 TABLET BY MOUTH EVERY 6 HOURS AS NEEDED FOR MUSCLE SPASMS. 06/12/23   Sheliah Hatch, MD  Naphazoline  HCl (CLEAR EYES OP) Place 1 drop into both eyes daily as needed (sore eyes).    [provider]  pantoprazole (PROTONIX) 40 MG tablet TAKE 1 TABLET BY MOUTH DAILY. 06/12/23   Sheliah Hatch, MD  Semaglutide,0.25 or 0.5MG /DOS, (OZEMPIC, 0.25 OR 0.5 MG/DOSE,) 2 MG/3ML SOPN Inject 0.25 mg into the skin once a week. 06/21/23   Bensimhon, Bevelyn Buckles, MD  sodium chloride (OCEAN) 0.65 % SOLN nasal spray Place 1 spray into both nostrils as needed (dryness).    [provider]  spironolactone (ALDACTONE) 25 MG tablet TAKE 1 TABLET (25 MG TOTAL) BY MOUTH DAILY. 01/02/23   Bensimhon, Bevelyn Buckles, MD  traZODone (DESYREL) 100 MG tablet TAKE 1 TABLET (100 MG TOTAL) BY MOUTH AT BEDTIME. 05/20/23  Sheliah Hatch, MD  VASCEPA 1 g capsule TAKE 2 CAPSULES BY MOUTH 2 TIMES DAILY. 06/12/23   Bensimhon, Bevelyn Buckles, MD     Allergies:   Not on File  Social History:   Social History   Socioeconomic History   Marital status: Married    Spouse name: [name]   Number of children: Not on file   Years of education: Not on file   Highest education level: Bachelor's degree (e.g., BA, AB, BS)  Occupational History   Not on file  Tobacco Use   Smoking status: Some Days    Current packs/day: 0.25    Average packs/day: 0.3 packs/day for 31.0 years (7.8 ttl pk-yrs)    Types: Cigarettes   Smokeless tobacco: Never   Tobacco comments:    Stopped a few weeks ago  Vaping Use   Vaping status: Never Used  Substance and Sexual Activity   Alcohol use: Yes    Comment: once a month   Drug use: Not Currently    Types: Marijuana    Comment: gummies every days   Sexual activity: Not on file  Other Topics Concern   Not on file  Social History Narrative   Lives with wife   Caffeine- coffee 4 c daily   Social Determinants of Health   Financial Resource Strain: Low Risk  (07/01/2023)   Overall Financial Resource Strain (CARDIA)    Difficulty of Paying Living Expenses: Not hard at all  Food Insecurity: No Food  Insecurity (07/01/2023)   Hunger Vital Sign    Worried About Running Out of Food in the Last Year: Never true    Ran Out of Food in the Last Year: Never true  Transportation Needs: No Transportation Needs (07/01/2023)   PRAPARE - Administrator, Civil Service (Medical): No    Lack of Transportation (Non-Medical): No  Physical Activity: Insufficiently Active (07/01/2023)   Exercise Vital Sign    Days of Exercise per Week: 2 days    Minutes of Exercise per Session: 30 min  Stress: No Stress Concern Present (07/01/2023)   [employer] of Occupational Health - Occupational Stress Questionnaire    Feeling of Stress : Only a little  Social Connections: Moderately Isolated (07/01/2023)   Social Connection and Isolation Panel [NHANES]    Frequency of Communication with Friends and Family: Once a week    Frequency of Social Gatherings with Friends and Family: Once a week    Attends Religious Services: Never    Database administrator or Organizations: Yes    Attends Engineer, structural: More than 4 times per year    Marital Status: Married  Catering manager Violence: Not on file    Family History:   The patient's family history includes Breast cancer in his maternal aunt; Dementia in his mother; Other in his brother; Stroke in his mother. There is no history of Colon cancer.    ROS:  Please see the history of present illness.  All other ROS reviewed and negative.     Physical Exam/Data:   Vitals:   07/16/23 2326 07/17/23 0015 07/17/23 0030 07/17/23 0045  BP: 116/78 121/81 94/83 94/67   Pulse: (!) 124 (!) 104 96 95  Resp: 17     Temp: 97.9 F (36.6 C)     TempSrc: Oral     SpO2: 97% 98% 97% 95%   No intake or output data in the 24 hours ending 07/17/23 0207    07/05/2023  12:44 PM 05/29/2023    1:52 PM 04/25/2023    9:37 AM  Last 3 Weights  Weight (lbs) 235 lb 234 lb 9.6 oz 243 lb  Weight (kg) 106.595 kg 106.414 kg 110.224 kg     There is no height or  weight on file to calculate BMI.  General:  Well nourished, well developed, in no acute distress HEENT: normal Neck: no JVD Vascular: No carotid bruits; Distal pulses 2+ bilaterally   Cardiac:  normal S1, S2; Irregularly irregular rhythm; no murmur, rubs or gallops Lungs:  clear to auscultation bilaterally, no wheezing, rhonchi or rales  Abd: soft, nontender, no hepatomegaly  Ext: no edema Musculoskeletal:  No deformities, BUE and BLE strength normal and equal Skin: warm and dry  Neuro:  CNs 2-12 intact, no focal abnormalities noted Psych:  Normal affect    EKG:  The ECG that was done  was personally reviewed and demonstrates Afib    Relevant CV Studies:   TTE 05/29/23:  IMPRESSIONS     1. Left ventricular ejection fraction, by estimation, is 35 to 40%. The  left ventricle has moderately decreased function. The left ventricle  demonstrates global hypokinesis. The left ventricular internal cavity size  was moderately dilated. There is mild   left ventricular hypertrophy. Left ventricular diastolic parameters are  consistent with Grade I diastolic dysfunction (impaired relaxation).   2. Device leads in RA/RV. Right ventricular systolic function is normal.  The right ventricular size is normal.   3. Left atrial size was moderately dilated.   4. The mitral valve is abnormal. Trivial mitral valve regurgitation. No  evidence of mitral stenosis.   5. The aortic valve is tricuspid. Aortic valve regurgitation is not  visualized. No aortic stenosis is present.   6. The inferior vena cava is normal in size with greater than 50%  respiratory variability, suggesting right atrial pressure of 3 mmHg.    LHC/RHC 07/01/20:  Prox RCA lesion is 10% stenosed. Prox LAD to Mid LAD lesion is 100% stenosed. Prox Cx to Mid Cx lesion is 20% stenosed.   Findings:   Ao = 112/72 (90) LV = 111/27 RA = 2 RV = 46/7 PA = 44/12 (27) PCW = 16 Fick cardiac output/index = 6.8/2.9 PVR = 1.5  WU SVR 1047 Ao sat = 96% PA sat = 70%, 78% SVC sat = 74%   Assessment: 1. 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 Ischemic CM EF 25-30% 3. Mildly elevated filling pressures with normal cardiac output    Laboratory Data:  High Sensitivity Troponin:   Recent Labs  Lab 07/16/23 2343  TROPONINIHS 87*      Chemistry Recent Labs  Lab 07/16/23 2342 07/16/23 2343  NA  --  137  K  --  3.8  CL  --  102  CO2  --  24  GLUCOSE  --  303*  BUN  --  18  CREATININE  --  1.27*  CALCIUM  --  9.2  MG 2.1  --   GFRNONAA  --  >60  ANIONGAP  --  11    No results for input(s): "PROT", "ALBUMIN", "AST", "ALT", "ALKPHOS", "BILITOT" in the last 168 hours. Lipids No results for input(s): "CHOL", "TRIG", "HDL", "LABVLDL", "LDLCALC", "CHOLHDL" in the last 168 hours. Hematology Recent Labs  Lab 07/16/23 2343  WBC 10.0  RBC 5.25  HGB 16.0  HCT 48.0  MCV 91.4  MCH 30.5  MCHC 33.3  RDW 12.6  PLT 282  Thyroid No results for input(s): "TSH", "FREET4" in the last 168 hours. BNPNo results for input(s): "BNP", "PROBNP" in the last 168 hours.  DDimer No results for input(s): "DDIMER" in the last 168 hours.   Radiology/Studies:  DG Chest 2 View  Result Date: 07/16/2023 CLINICAL DATA:  Chest tightness.  Defibrillator has gone off. EXAM: CHEST - 2 VIEW COMPARISON:  09/25/2021 FINDINGS: Dual lead left-sided pacemaker with lead tips projecting over the right atrium and ventricle. Heart size upper normal. Normal mediastinal contours. No pulmonary edema, pleural effusion, or pneumothorax. No focal airspace disease. No acute osseous abnormalities IMPRESSION: 1. No acute chest findings. 2. Borderline cardiomegaly. Electronically Signed   By: Narda Rutherford M.D.   On: 07/16/2023 23:56     Assessment and Plan:   Telvin Minzey is a 57 y.o. male with a PMH of NICM (EF = 35-40%), VT s/p [employer] DC-ICD, HTN, HLD, PAF (on Eliquis), TIA, DMII, Gout, hypothyroidism and prior substance abuse  who is being seen 07/17/2023 for the evaluation of ICD shocks.  #ICD Shocks #Prior VT #PAF (on Eliquis) ::The pt has had 3 shocks by his ICD in the past 48 hrs. On interrogation of his device he has ventricular cycle lengths in the low 200s which corresponds to ventricular rates >200. This could represent VT; however, he is also in Afib. I'm wondering if his ventricular rates were fast 2/2 RVR then accelerated by ATP into a VF zone thus resulting in defibrillation. I'm unsure at this time and will need EP to weigh in. In the interim will start amiodarone and tentatively plan for TEE cardioversion to get out of Afib. -Start amiodarone IV load  -EP consult in AM -limited TTE to reassess EF -He has missed 2 doses of Eliquis in the past 1 month. Tentatively plan for TEE + DCCV given that his Afib may be a driver of his shocks. -continue apixaban 5mg  BID -Maintain telemetry -NPO since MN  #NICM (EF = 35-40%) #HTN #HLD -continue coreg 25 mg BID -continue entresto -continue spironolactone 25 mg daily -continue ASA 81 mg daily -continue fenofibrate -continue Vascepa -continue atorvastatin  #Gout -continue allopurinol  #DMII -hold home semaglutide -start SSI -q6h POC glucose checks while NPO  #Hypothyroidism ::FT4 is up and TSH is low. Need to verify his synthroid regimen as its not listed in the chart. May need dose reduction.      Risk Assessment/Risk Scores:         CHA2DS2-VASc Score = 5  This indicates a 7.2% annual risk of stroke. The patient's score is based upon: CHF, HTN, TIA, DM2      Code Status: Full Code  Severity of Illness: The appropriate patient status for this patient is INPATIENT. Inpatient status is judged to be reasonable and necessary in order to provide the required intensity of service to ensure the patient's safety. The patient's presenting symptoms, physical exam findings, and initial radiographic and laboratory data in the context of their chronic  comorbidities is felt to place them at high risk for further clinical deterioration. Furthermore, it is not anticipated that the patient will be medically stable for discharge from the hospital within 2 midnights of admission.   * I certify that at the point of admission it is my clinical judgment that the patient will require inpatient hospital care spanning beyond 2 midnights from the point of admission due to high intensity of service, high risk for further deterioration and high frequency of surveillance required.*   For questions  or updates, please contact [facility] Please consult [email or web address] for contact info under     Signed, Karl Ito, MD  07/17/2023 2:07 AM

## 2023-07-17 NOTE — ED Notes (Signed)
Writer called [employer] number and was told I would receive a call back to trouble shoot interrogating device since our interrogators are not currently recognizing device.

## 2023-07-17 NOTE — ED Provider Notes (Signed)
[place] [facility]ERGENCY DEPARTMENT AT [facility] Provider Note   CSN: [id number] Arrival date & time: 07/16/23  2321     History  Chief Complaint  Patient presents with   AICD Problem    Jason Floyd is a 57 y.o. male.  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  He reportedly got shocked and it was notified to his cardiology clinic though he did not realize that it happens.  He was told if he had recurrent episode to come to the emergency department.  Had 2 episodes this evening.  1 when he was folding laundry and the other 1 when he was getting ready to lay down in bed.  He has been feeling a bit lightheaded which she did feel last time when he had ventricular tachycardia.  Denies any specific chest pain or difficulty breathing.  Denies any recent medication changes.        Home Medications Prior to Admission medications   Medication Sig Start Date End Date Taking? Authorizing Provider  allopurinol (ZYLOPRIM) 300 MG tablet TAKE 2 TABLETS (600 MG TOTAL) BY MOUTH DAILY. 01/02/23  Yes Sheliah Hatch, MD  aspirin EC 81 MG tablet Take 81 mg by mouth daily. Swallow whole.   Yes [provider]  atorvastatin (LIPITOR) 80 MG tablet Take 80 mg by mouth daily. 07/10/23  Yes [provider]  calcium carbonate (TUMS - DOSED IN MG ELEMENTAL CALCIUM) 500 MG chewable tablet Chew 2-3 tablets by mouth daily as needed for indigestion or heartburn.   Yes [provider]  carvedilol (COREG) 25 MG tablet Take 1 tablet (25 mg total) by mouth 2 (two) times daily. 01/01/23  Yes Graciella Freer, PA-C  cetirizine (ZYRTEC) 10 MG tablet Take 10 mg by mouth daily.   Yes [provider]  cyclobenzaprine (FLEXERIL) 10 MG tablet TAKE 1 TABLET BY MOUTH 3 TIMES DAILY AS NEEDED FOR MUSCLE SPASMS. Patient taking differently: Take 10 mg by mouth at bedtime. 07/10/23  Yes Sheliah Hatch, MD  ELIQUIS 5 MG TABS tablet TAKE 1  TABLET BY MOUTH 2 TIMES A DAY 09/04/22  Yes Bensimhon, Bevelyn Buckles, MD  ENTRESTO 49-51 MG TAKE 1 TABLET BY MOUTH 2 TIMES DAILY. 02/04/23  Yes Bensimhon, Bevelyn Buckles, MD  Evolocumab (REPATHA SURECLICK) 140 MG/ML SOAJ Inject 140 mg into the skin every 14 (fourteen) days. 02/04/23  Yes Bensimhon, Bevelyn Buckles, MD  FARXIGA 10 MG TABS tablet TAKE 1 TABLET BY MOUTH DAILY BEFORE BREAKFAST. 07/10/23  Yes Sheliah Hatch, MD  fenofibrate 160 MG tablet TAKE 1 TABLET BY MOUTH DAILY. 05/20/23  Yes Sheliah Hatch, MD  Magnesium Oxide 400 MG CAPS Take 1 capsule (400 mg total) by mouth in the morning and at bedtime. 01/02/23  Yes Graciella Freer, PA-C  methocarbamol (ROBAXIN) 500 MG tablet TAKE 1 TABLET BY MOUTH EVERY 6 HOURS AS NEEDED FOR MUSCLE SPASMS. 06/12/23  Yes Sheliah Hatch, MD  Naphazoline HCl (CLEAR EYES OP) Place 1 drop into both eyes daily as needed (sore eyes).   Yes [provider]  pantoprazole (PROTONIX) 40 MG tablet TAKE 1 TABLET BY MOUTH DAILY. 06/12/23  Yes Sheliah Hatch, MD  Semaglutide,0.25 or 0.5MG /DOS, (OZEMPIC, 0.25 OR 0.5 MG/DOSE,) 2 MG/3ML SOPN Inject 0.25 mg into the skin once a week. 06/21/23  Yes Bensimhon, Bevelyn Buckles, MD  sodium chloride (OCEAN) 0.65 % SOLN nasal spray Place 1 spray into both nostrils as needed (  dryness).   Yes [provider]  spironolactone (ALDACTONE) 25 MG tablet TAKE 1 TABLET (25 MG TOTAL) BY MOUTH DAILY. 01/02/23  Yes Bensimhon, Bevelyn Buckles, MD  traZODone (DESYREL) 100 MG tablet TAKE 1 TABLET (100 MG TOTAL) BY MOUTH AT BEDTIME. 05/20/23  Yes Sheliah Hatch, MD  VASCEPA 1 g capsule TAKE 2 CAPSULES BY MOUTH 2 TIMES DAILY. 06/12/23  Yes Bensimhon, Bevelyn Buckles, MD      Allergies    Patient has no known allergies.    Review of Systems   Review of Systems  Physical Exam Updated Vital Signs BP 94/64 (BP Location: Right Arm)   Pulse 88   Temp (!) 97.5 F (36.4 C) (Oral)   Resp (!) 23   Ht 6\' 2"  (1.88 m)   Wt 105.2 kg   SpO2 96%   BMI  29.79 kg/m  Physical Exam Vitals and nursing note reviewed.  Constitutional:      Appearance: He is well-developed.  HENT:     Head: Normocephalic and atraumatic.  Eyes:     Pupils: Pupils are equal, round, and reactive to light.  Neck:     Vascular: No JVD.  Cardiovascular:     Rate and Rhythm: Tachycardia present. Rhythm irregular.     Heart sounds: No murmur heard.    No friction rub. No gallop.  Pulmonary:     Effort: No respiratory distress.     Breath sounds: No wheezing.  Abdominal:     General: There is no distension.     Tenderness: There is no abdominal tenderness. There is no guarding or rebound.  Musculoskeletal:        General: Normal range of motion.     Cervical back: Normal range of motion and neck supple.  Skin:    Coloration: Skin is not pale.     Findings: No rash.  Neurological:     Mental Status: He is alert and oriented to person, place, and time.  Psychiatric:        Behavior: Behavior normal.     ED Results / Procedures / Treatments   Labs (all labs ordered are listed, but only abnormal results are displayed) Labs Reviewed  BASIC METABOLIC PANEL - Abnormal; Notable for the following components:      Result Value   Glucose, Bld 303 (*)    Creatinine, Ser 1.27 (*)    All other components within normal limits  T4, FREE - Abnormal; Notable for the following components:   Free T4 2.53 (*)    All other components within normal limits  TSH - Abnormal; Notable for the following components:   TSH 0.023 (*)    All other components within normal limits  CBG MONITORING, ED - Abnormal; Notable for the following components:   Glucose-Capillary 233 (*)    All other components within normal limits  TROPONIN I (HIGH SENSITIVITY) - Abnormal; Notable for the following components:   Troponin I (High Sensitivity) 87 (*)    All other components within normal limits  TROPONIN I (HIGH SENSITIVITY) - Abnormal; Notable for the following components:   Troponin I  (High Sensitivity) 82 (*)    All other components within normal limits  CBC  MAGNESIUM  HIV ANTIBODY (ROUTINE TESTING W REFLEX)  CBC WITH DIFFERENTIAL/PLATELET  COMPREHENSIVE METABOLIC PANEL  MAGNESIUM  TYPE AND SCREEN    EKG EKG Interpretation Date/Time:  Tuesday July 16 2023 23:28:49 EDT Ventricular Rate:  101 PR Interval:    QRS Duration:  110 QT Interval:  380 QTC Calculation: 492 R Axis:   -57  Text Interpretation: Atrial fibrillation with rapid ventricular response Low voltage QRS Left anterior fascicular block Septal infarct , age undetermined Cannot rule out Inferior infarct , age undetermined Abnormal ECG Otherwise no significant change Confirmed by Melene Plan [phone]) on 07/17/2023 12:21:22 AM  Radiology DG Chest 2 View  Result Date: 07/16/2023 CLINICAL DATA:  Chest tightness.  Defibrillator has gone off. EXAM: CHEST - 2 VIEW COMPARISON:  09/25/2021 FINDINGS: Dual lead left-sided pacemaker with lead tips projecting over the right atrium and ventricle. Heart size upper normal. Normal mediastinal contours. No pulmonary edema, pleural effusion, or pneumothorax. No focal airspace disease. No acute osseous abnormalities IMPRESSION: 1. No acute chest findings. 2. Borderline cardiomegaly. Electronically Signed   By: Narda Rutherford M.D.   On: 07/16/2023 23:56    Procedures .Critical Care  Performed by: Melene Plan, DO Authorized by: Melene Plan, DO   Critical care provider statement:    Critical care time (minutes):  35   Critical care time was exclusive of:  Separately billable procedures and treating other patients   Critical care was time spent personally by me on the following activities:  Development of treatment plan with patient or surrogate, discussions with consultants, evaluation of patient's response to treatment, examination of patient, ordering and review of laboratory studies, ordering and review of radiographic studies, ordering and performing treatments and  interventions, pulse oximetry, re-evaluation of patient's condition and review of old charts   Care discussed with: admitting provider       Medications Ordered in ED Medications  insulin aspart (novoLOG) injection 0-8 Units (has no administration in time range)  amiodarone (NEXTERONE) 1.8 mg/mL load via infusion 150 mg (150 mg Intravenous Bolus from Bag 07/17/23 0301)    Followed by  amiodarone (NEXTERONE PREMIX) 360-4.14 MG/200ML-% (1.8 mg/mL) IV infusion (60 mg/hr Intravenous New Bag/Given 07/17/23 0303)    Followed by  amiodarone (NEXTERONE PREMIX) 360-4.14 MG/200ML-% (1.8 mg/mL) IV infusion (has no administration in time range)  allopurinol (ZYLOPRIM) tablet 600 mg (has no administration in time range)  aspirin EC tablet 81 mg (has no administration in time range)  atorvastatin (LIPITOR) tablet 80 mg (has no administration in time range)  carvedilol (COREG) tablet 25 mg (has no administration in time range)  sacubitril-valsartan (ENTRESTO) 49-51 mg per tablet (has no administration in time range)  fenofibrate tablet 160 mg (has no administration in time range)  spironolactone (ALDACTONE) tablet 25 mg (has no administration in time range)  icosapent Ethyl (VASCEPA) 1 g capsule 2 g (has no administration in time range)  traZODone (DESYREL) tablet 100 mg (has no administration in time range)  calcium carbonate (TUMS - dosed in mg elemental calcium) chewable tablet 400-600 mg of elemental calcium (has no administration in time range)  pantoprazole (PROTONIX) EC tablet 40 mg (has no administration in time range)  apixaban (ELIQUIS) tablet 5 mg (has no administration in time range)  potassium chloride SA (KLOR-CON M) CR tablet 40 mEq (40 mEq Oral Given 07/17/23 0126)    ED Course/ Medical Decision Making/ A&P                                 Medical Decision Making Amount and/or Complexity of Data Reviewed Labs: ordered. Radiology: ordered.  Risk Prescription drug management. Decision  regarding hospitalization.   57 yo M with a chief complaints of having his  defibrillator go off 3 times in the past 24 hours.  Patient has been feeling a bit lightheaded like he was previously when he was in ventricular tachycardia.    will obtain a laboratory evaluation.  Discussed with cardiology.  No significant electrolyte abnormalities.  Potassium mildly below 4 so we will replete here.  Troponin mildly elevated.  I discussed case with cardiology fellow.  Will admit.  The patients results and plan were reviewed and discussed.   Any x-rays performed were independently reviewed by myself.   Differential diagnosis were considered with the presenting HPI.  Medications  insulin aspart (novoLOG) injection 0-8 Units (has no administration in time range)  amiodarone (NEXTERONE) 1.8 mg/mL load via infusion 150 mg (150 mg Intravenous Bolus from Bag 07/17/23 0301)    Followed by  amiodarone (NEXTERONE PREMIX) 360-4.14 MG/200ML-% (1.8 mg/mL) IV infusion (60 mg/hr Intravenous New Bag/Given 07/17/23 0303)    Followed by  amiodarone (NEXTERONE PREMIX) 360-4.14 MG/200ML-% (1.8 mg/mL) IV infusion (has no administration in time range)  allopurinol (ZYLOPRIM) tablet 600 mg (has no administration in time range)  aspirin EC tablet 81 mg (has no administration in time range)  atorvastatin (LIPITOR) tablet 80 mg (has no administration in time range)  carvedilol (COREG) tablet 25 mg (has no administration in time range)  sacubitril-valsartan (ENTRESTO) 49-51 mg per tablet (has no administration in time range)  fenofibrate tablet 160 mg (has no administration in time range)  spironolactone (ALDACTONE) tablet 25 mg (has no administration in time range)  icosapent Ethyl (VASCEPA) 1 g capsule 2 g (has no administration in time range)  traZODone (DESYREL) tablet 100 mg (has no administration in time range)  calcium carbonate (TUMS - dosed in mg elemental calcium) chewable tablet 400-600 mg of elemental calcium  (has no administration in time range)  pantoprazole (PROTONIX) EC tablet 40 mg (has no administration in time range)  apixaban (ELIQUIS) tablet 5 mg (has no administration in time range)  potassium chloride SA (KLOR-CON M) CR tablet 40 mEq (40 mEq Oral Given 07/17/23 0126)    Vitals:   07/17/23 0145 07/17/23 0200 07/17/23 0257 07/17/23 0305  BP: 105/73 98/77  94/64  Pulse: 88 (!) 104  88  Resp: (!) 24 (!) 21  (!) 23  Temp:    (!) 97.5 F (36.4 C)  TempSrc:    Oral  SpO2: 98% 96%  96%  Weight:   105.2 kg   Height:   6\' 2"  (1.88 m)     Final diagnoses:  AICD discharge    Admission/ observation were discussed with the admitting physician, patient and/or family and they are comfortable with the plan.          Final Clinical Impression(s) / ED Diagnoses Final diagnoses:  AICD discharge    Rx / DC Orders ED Discharge Orders     None         Melene Plan, DO 07/17/23 [phone]

## 2023-07-17 NOTE — ED Notes (Signed)
Cardiology MD at bedside interrogating defibrillator.

## 2023-07-17 NOTE — ED Notes (Signed)
Cardiology and EP at bedside.

## 2023-07-17 NOTE — ED Notes (Signed)
ED TO INPATIENT HANDOFF REPORT  ED Nurse Name and Phone #: [phone]  S Name/Age/Gender Jason Floyd 57 y.o. male Room/Bed: 006C/006C  Code Status   Code Status: Full Code  Home/SNF/Other Home Patient oriented to: self, place, time, and situation Is this baseline? Yes   Triage Complete: Triage complete  Chief Complaint Ventricular tachycardia (HCC) [I47.20]  Triage Note Pt is coming in after his defib has fired 3 times in 24hrs house, he states he has been working hard over the weekend and has been exhausted but the first time it fired it was when he was sleeping, the clinic called him and told him it fired and if it happened again in a 24hr period that he should come here. It has fired twice since then. He has no chest pain or shortness of breath and is otherwise stable at this time.    Allergies No Known Allergies  Level of Care/Admitting Diagnosis ED Disposition     ED Disposition  Admit   Condition  --   Comment  Hospital Area: [facility] [[id number]]  Level of Care: Progressive [102]  Admit to Progressive based on following criteria: CARDIOVASCULAR & THORACIC of moderate stability with acute coronary syndrome symptoms/low risk myocardial infarction/hypertensive urgency/arrhythmias/heart failure potentially compromising stability and stable post cardiovascular intervention patients.  May admit patient to [facility] or [facility] if equivalent level of care is available:: No  Covid Evaluation: Asymptomatic - no recent exposure (last 10 days) testing not required  Diagnosis: Ventricular tachycardia [facility]) [[id number]]  Admitting Physician: Karl Ito [[id number]]  Attending Physician: Rollene Rotunda [phone]  Certification:: I certify this patient will need inpatient services for at least 2 midnights  Expected Medical Readiness: 07/22/2023          B Medical/Surgery History Past Medical History:  Diagnosis Date   AICD (automatic  cardioverter/defibrillator) present    Allergy    Arthritis    Atrial fibrillation (HCC)    CHF (congestive heart failure) (HCC)    CHF (congestive heart failure) (HCC)    Diabetes mellitus without complication (HCC)    Drug abuse (HCC)    Eczema    GERD (gastroesophageal reflux disease)    Gout    Heart attack (HCC) 06/10/2020   History of kidney stones    Hyperlipidemia    Hypertension    MI (myocardial infarction) (HCC)    PE (pulmonary embolism)    TIA (transient ischemic attack)    hx of per pt - pt not aware he had not followed by a neurologist   Ventricular tachycardia [facility])    Past Surgical History:  Procedure Laterality Date   CERVICAL FUSION     ICD IMPLANT N/A 08/22/2020   Procedure: ICD IMPLANT;  Surgeon: Duke Salvia, MD;  Location: [facility] INVASIVE CV LAB;  Service: Cardiovascular;  Laterality: N/A;   KNEE ARTHROSCOPY Right 10/28/2020   Procedure: RIGHT KNEE ARTHROSCOPY, PARTIAL MEDIAL MENISCECTOMY, CHONDROPLASTY;  Surgeon: Marcene Corning, MD;  Location: [facility] ORS;  Service: Orthopedics;  Laterality: Right;   left thumb surgery      RIGHT/LEFT HEART CATH AND CORONARY ANGIOGRAPHY N/A 07/01/2020   Procedure: RIGHT/LEFT HEART CATH AND CORONARY ANGIOGRAPHY;  Surgeon: Dolores Patty, MD;  Location: [facility] INVASIVE CV LAB;  Service: Cardiovascular;  Laterality: N/A;   SHOULDER ARTHROSCOPY Left    SHOULDER ARTHROSCOPY WITH ROTATOR CUFF REPAIR AND SUBACROMIAL DECOMPRESSION Right 06/14/2022   Procedure: SHOULDER ARTHROSCOPY WITH ROTATOR CUFF REPAIR, SUBACROMIAL DECOMPRESSION AND TENOTOMY;  Surgeon: Jones Broom,  MD;  Location: [facility] ORS;  Service: Orthopedics;  Laterality: Right;   TOTAL KNEE ARTHROPLASTY Right 10/03/2021   Procedure: RIGHT TOTAL KNEE ARTHROPLASTY;  Surgeon: Marcene Corning, MD;  Location: [facility] ORS;  Service: Orthopedics;  Laterality: Right;   VASECTOMY Bilateral    WISDOM TOOTH EXTRACTION       A IV Location/Drains/Wounds Patient Lines/Drains/Airways Status      Active Line/Drains/Airways     Name Placement date Placement time Site Days   Peripheral IV 07/17/23 18 G Anterior;Left Forearm 07/17/23  0257  Forearm  less than 1            Intake/Output Last 24 hours  Intake/Output Summary (Last 24 hours) at 07/17/2023 1251 Last data filed at 07/17/2023 1231 Gross per 24 hour  Intake 325.17 ml  Output --  Net 325.17 ml    Labs/Imaging Results for orders placed or performed during the hospital encounter of 07/16/23 (from the past 48 hour(s))  Magnesium     Status: None   Collection Time: 07/16/23 11:42 PM  Result Value Ref Range   Magnesium 2.1 1.7 - 2.4 mg/dL    Comment: Performed at [facility], [address], [place], [place] [address]  Basic metabolic panel     Status: Abnormal   Collection Time: 07/16/23 11:43 PM  Result Value Ref Range   Sodium 137 135 - 145 mmol/L   Potassium 3.8 3.5 - 5.1 mmol/L   Chloride 102 98 - 111 mmol/L   CO2 24 22 - 32 mmol/L   Glucose, Bld 303 (H) 70 - 99 mg/dL    Comment: Glucose reference range applies only to samples taken after fasting for at least 8 hours.   BUN 18 6 - 20 mg/dL   Creatinine, Ser 1.01 (H) 0.61 - 1.24 mg/dL   Calcium 9.2 8.9 - 75.1 mg/dL   GFR, Estimated >02 >58 mL/min    Comment: (NOTE) Calculated using the CKD-EPI Creatinine Equation (2021)    Anion gap 11 5 - 15    Comment: Performed at [facility], [address], [place], [place] [address]  CBC     Status: None   Collection Time: 07/16/23 11:43 PM  Result Value Ref Range   WBC 10.0 4.0 - 10.5 K/uL   RBC 5.25 4.22 - 5.81 MIL/uL   Hemoglobin 16.0 13.0 - 17.0 g/dL   HCT 24.2 35.3 - 61.4 %   MCV 91.4 80.0 - 100.0 fL   MCH 30.5 26.0 - 34.0 pg   MCHC 33.3 30.0 - 36.0 g/dL   RDW 43.1 54.0 - 08.6 %   Platelets 282 150 - 400 K/uL   nRBC 0.0 0.0 - 0.2 %    Comment: Performed at [facility], [address], [place], [place] [address]  Troponin I (High Sensitivity)     Status: Abnormal   Collection  Time: 07/16/23 11:43 PM  Result Value Ref Range   Troponin I (High Sensitivity) 87 (H) <18 ng/L    Comment: (NOTE) Elevated high sensitivity troponin I (hsTnI) values and significant  changes across serial measurements may suggest ACS but many other  chronic and acute conditions are known to elevate hsTnI results.  Refer to the "Links" section for chest pain algorithms and additional  guidance. Performed at [facility], [address], [place], [place] [address]   Troponin I (High Sensitivity)     Status: Abnormal   Collection Time: 07/17/23  2:08 AM  Result Value  Ref Range   Troponin I (High Sensitivity) 82 (H) <18 ng/L    Comment: (NOTE) Elevated high sensitivity troponin I (hsTnI) values and significant  changes across serial measurements may suggest ACS but many other  chronic and acute conditions are known to elevate hsTnI results.  Refer to the "Links" section for chest pain algorithms and additional  guidance. Performed at [facility], [address] [place], [place] [address]   HIV Antibody (routine testing w rflx)     Status: None   Collection Time: 07/17/23  2:08 AM  Result Value Ref Range   HIV Screen 4th Generation wRfx Non Reactive Non Reactive    Comment: Performed at [facility], [address] [place], [place] [address]  T4, free     Status: Abnormal   Collection Time: 07/17/23  2:08 AM  Result Value Ref Range   Free T4 2.53 (H) 0.61 - 1.12 ng/dL    Comment: (NOTE) Biotin ingestion may interfere with free T4 tests. If the results are inconsistent with the TSH level, previous test results, or the clinical presentation, then consider biotin interference. If needed, order repeat testing after stopping biotin. Performed at [facility], [address] [place], [place] [address]   TSH     Status: Abnormal   Collection Time: 07/17/23  2:08 AM  Result Value Ref Range   TSH 0.023 (L) 0.350 - 4.500 uIU/mL    Comment: Performed by a  3rd Generation assay with a functional sensitivity of <=0.01 uIU/mL. Performed at [facility], [address] [place], [place] [address]   CBG monitoring, ED     Status: Abnormal   Collection Time: 07/17/23  3:08 AM  Result Value Ref Range   Glucose-Capillary 233 (H) 70 - 99 mg/dL    Comment: Glucose reference range applies only to samples taken after fasting for at least 8 hours.   Comment 1 Document in Chart   Type and screen [facility]     Status: None   Collection Time: 07/17/23  4:15 AM  Result Value Ref Range   ABO/RH(D) AB POS    Antibody Screen NEG    Sample Expiration      07/20/2023,2359 Performed at [facility], [address], [place], [place] [address]   CBC with Differential/Platelet     Status: Abnormal   Collection Time: 07/17/23  4:24 AM  Result Value Ref Range   WBC 9.6 4.0 - 10.5 K/uL   RBC 4.91 4.22 - 5.81 MIL/uL   Hemoglobin 15.0 13.0 - 17.0 g/dL   HCT 13.0 86.5 - 78.4 %   MCV 91.2 80.0 - 100.0 fL   MCH 30.5 26.0 - 34.0 pg   MCHC 33.5 30.0 - 36.0 g/dL   RDW 69.6 29.5 - 28.4 %   Platelets 237 150 - 400 K/uL   nRBC 0.0 0.0 - 0.2 %   Neutrophils Relative % 45 %   Neutro Abs 4.4 1.7 - 7.7 K/uL   Lymphocytes Relative 44 %   Lymphs Abs 4.2 (H) 0.7 - 4.0 K/uL   Monocytes Relative 7 %   Monocytes Absolute 0.7 0.1 - 1.0 K/uL   Eosinophils Relative 2 %   Eosinophils Absolute 0.2 0.0 - 0.5 K/uL   Basophils Relative 1 %   Basophils Absolute 0.1 0.0 - 0.1 K/uL   Immature Granulocytes 1 %   Abs Immature Granulocytes 0.06 0.00 - 0.07 K/uL  Comment: Performed at [facility], [address], [place], [place] [address]  Comprehensive metabolic panel     Status: Abnormal   Collection Time: 07/17/23  4:24 AM  Result Value Ref Range   Sodium 137 135 - 145 mmol/L   Potassium 4.3 3.5 - 5.1 mmol/L   Chloride 101 98 - 111 mmol/L   CO2 25 22 - 32 mmol/L   Glucose, Bld 217 (H) 70 - 99 mg/dL    Comment: Glucose reference range  applies only to samples taken after fasting for at least 8 hours.   BUN 16 6 - 20 mg/dL   Creatinine, Ser 3.66 (H) 0.61 - 1.24 mg/dL   Calcium 9.0 8.9 - 44.0 mg/dL   Total Protein 6.0 (L) 6.5 - 8.1 g/dL   Albumin 3.6 3.5 - 5.0 g/dL   AST 15 15 - 41 U/L   ALT 27 0 - 44 U/L   Alkaline Phosphatase 42 38 - 126 U/L   Total Bilirubin 0.4 0.3 - 1.2 mg/dL   GFR, Estimated >34 >74 mL/min    Comment: (NOTE) Calculated using the CKD-EPI Creatinine Equation (2021)    Anion gap 11 5 - 15    Comment: Performed at [facility], [address], [place], [place] [address]  Magnesium     Status: None   Collection Time: 07/17/23  4:24 AM  Result Value Ref Range   Magnesium 2.0 1.7 - 2.4 mg/dL    Comment: Performed at [facility], [address], [place], [place] [address]  CBG monitoring, ED     Status: Abnormal   Collection Time: 07/17/23  6:55 AM  Result Value Ref Range   Glucose-Capillary 179 (H) 70 - 99 mg/dL    Comment: Glucose reference range applies only to samples taken after fasting for at least 8 hours.   Comment 1 Document in Chart   CBG monitoring, ED     Status: Abnormal   Collection Time: 07/17/23 12:09 PM  Result Value Ref Range   Glucose-Capillary 185 (H) 70 - 99 mg/dL    Comment: Glucose reference range applies only to samples taken after fasting for at least 8 hours.   ECHOCARDIOGRAM LIMITED  Result Date: 07/17/2023    ECHOCARDIOGRAM LIMITED REPORT   Patient Name:   Jason Floyd Date of Exam: 07/17/2023 Medical Rec #:  [id number]      Height:       74.0 in Accession #:    [id number]     Weight:       232.0 lb Date of Birth:  1965/12/20     BSA:          2.315 m Patient Age:    56 years       BP:           91/72 mmHg Patient Gender: M              HR:           84 bpm. Exam Location:  Inpatient Procedure: 2D Echo Indications:    V Tach I47.2  History:        Patient has prior history of Echocardiogram examinations, most                 recent 05/29/2023. Defibrillator.   Sonographer:    Harriette Bouillon RDCS Referring Phys: [id number] Karl Ito IMPRESSIONS  1. Image quality makes assessment of wall motion challenging. Left ventricular ejection fraction, by estimation, is 35  to 40%. The left ventricle has moderately decreased function. There is mild left ventricular hypertrophy.  2. Right ventricular systolic function is normal. The right ventricular size is normal. Conclusion(s)/Recommendation(s): LVEF appears unchanged from prior study. FINDINGS  Left Ventricle: Image quality makes assessment of wall motion challenging. Left ventricular ejection fraction, by estimation, is 35 to 40%. The left ventricle has moderately decreased function. There is mild left ventricular hypertrophy. Right Ventricle: The right ventricular size is normal. Right ventricular systolic function is normal. Left Atrium: Left atrial size was normal in size. Right Atrium: Right atrial size was normal in size. Additional Comments: A device lead is visualized.  LEFT VENTRICLE PLAX 2D LVIDd:         5.90 cm LVIDs:         4.30 cm LV PW:         1.00 cm LV IVS:        1.00 cm  LEFT ATRIUM         Index LA diam:    5.40 cm 2.33 cm/m Jason Floyd Electronically signed by Jason Floyd Signature Date/Time: 07/17/2023/9:39:33 AM    Final    DG Chest 2 View  Result Date: 07/16/2023 CLINICAL DATA:  Chest tightness.  Defibrillator has gone off. EXAM: CHEST - 2 VIEW COMPARISON:  09/25/2021 FINDINGS: Dual lead left-sided pacemaker with lead tips projecting over the right atrium and ventricle. Heart size upper normal. Normal mediastinal contours. No pulmonary edema, pleural effusion, or pneumothorax. No focal airspace disease. No acute osseous abnormalities IMPRESSION: 1. No acute chest findings. 2. Borderline cardiomegaly. Electronically Signed   By: Narda Rutherford M.D.   On: 07/16/2023 23:56    Pending Labs Unresulted Labs (From admission, onward)    None       Vitals/Pain Today's Vitals   07/17/23 1100  07/17/23 1130 07/17/23 1200 07/17/23 1230  BP: 96/63 99/69 113/63 96/67  Pulse: 86 93 89 77  Resp: 16 18 10 19   Temp:    98.1 F (36.7 C)  TempSrc:      SpO2: 95% 93% 99% 95%  Weight:      Height:      PainSc:        Isolation Precautions No active isolations  Medications Medications  insulin aspart (novoLOG) injection 0-8 Units ( Subcutaneous Not Given 07/17/23 1219)  allopurinol (ZYLOPRIM) tablet 600 mg (600 mg Oral Given 07/17/23 0820)  aspirin EC tablet 81 mg (81 mg Oral Given 07/17/23 0820)  atorvastatin (LIPITOR) tablet 80 mg (80 mg Oral Given 07/17/23 0821)  carvedilol (COREG) tablet 25 mg (25 mg Oral Given 07/17/23 0821)  sacubitril-valsartan (ENTRESTO) 49-51 mg per tablet (1 tablet Oral Given 07/17/23 0821)  fenofibrate tablet 160 mg (160 mg Oral Given 07/17/23 0821)  spironolactone (ALDACTONE) tablet 25 mg (25 mg Oral Given 07/17/23 0820)  icosapent Ethyl (VASCEPA) 1 g capsule 2 g (2 g Oral Given 07/17/23 0819)  traZODone (DESYREL) tablet 100 mg (has no administration in time range)  calcium carbonate (TUMS - dosed in mg elemental calcium) chewable tablet 400-600 mg of elemental calcium (has no administration in time range)  pantoprazole (PROTONIX) EC tablet 40 mg (40 mg Oral Given 07/17/23 0819)  apixaban (ELIQUIS) tablet 5 mg (5 mg Oral Given 07/17/23 0820)  mexiletine (MEXITIL) capsule 150 mg (has no administration in time range)  potassium chloride SA (KLOR-CON M) CR tablet 40 mEq (40 mEq Oral Given 07/17/23 0126)  amiodarone (NEXTERONE) 1.8 mg/mL load via infusion 150 mg (150 mg Intravenous  Bolus from Bag 07/17/23 0301)    Mobility walks     Focused Assessments Cardiac Assessment Handoff:  Cardiac Rhythm: Atrial fibrillation Lab Results  Component Value Date   CKTOTAL 82 04/30/2007   CKMB 2.4 04/30/2007   TROPONINI (H) 04/30/2007    0.09        PERSISTENTLY INCREASED TROPONIN VALUES IN THE RANGE OF 0.06-0.49 ng/mL CAN BE SEEN IN:       -UNSTABLE ANGINA   No results found  for: "DDIMER" Does the Patient currently have chest pain? No    R Recommendations: See Admitting Provider Note  Report given to:   Additional Notes:

## 2023-07-17 NOTE — Anesthesia Preprocedure Evaluation (Signed)
Anesthesia Evaluation  Patient identified by MRN, date of birth, ID band Patient awake    Reviewed: Allergy & Precautions, NPO status , Patient's Chart, lab work & pertinent test results  History of Anesthesia Complications Negative for: history of anesthetic complications  Airway Mallampati: III  TM Distance: >3 FB Neck ROM: Full   Comment: Previous grade I view with MAC 4, easy mask Dental  (+) Dental Advisory Given   Pulmonary neg shortness of breath, neg sleep apnea, neg COPD, neg recent URI, Current Smoker and Patient abstained from smoking., PE (17 years ago)   Pulmonary exam normal breath sounds clear to auscultation       Cardiovascular hypertension, Pt. on home beta blockers (-) angina + CAD, + Past MI (2021) and +CHF (EF 35-40%)  + dysrhythmias Atrial Fibrillation and Ventricular Tachycardia + Cardiac Defibrillator  Rhythm:Irregular Rate:Normal  HLD   Neuro/Psych neg Seizures TIA Neuromuscular disease (cervical radiculopathy)    GI/Hepatic Neg liver ROS,GERD  Medicated,,  Endo/Other  diabetesHypothyroidism    Renal/GU negative Renal ROS     Musculoskeletal  (+) Arthritis , Osteoarthritis,    Abdominal   Peds  Hematology   Anesthesia Other Findings Admitted for repeated ICD shocks  Last Eliquis: this morning  Last Ozempic: 07/10/2023  Reproductive/Obstetrics                             Anesthesia Physical Anesthesia Plan  ASA: 3  Anesthesia Plan: General   Post-op Pain Management: Minimal or no pain anticipated   Induction: Intravenous  PONV Risk Score and Plan: 1 and TIVA and Treatment may vary due to age or medical condition  Airway Management Planned: Natural Airway and Mask  Additional Equipment:   Intra-op Plan:   Post-operative Plan:   Informed Consent: I have reviewed the patients History and Physical, chart, labs and discussed the procedure including the  risks, benefits and alternatives for the proposed anesthesia with the patient or authorized representative who has indicated his/her understanding and acceptance.       Plan Discussed with: Anesthesiologist and CRNA  Anesthesia Plan Comments: (Risks of general anesthesia discussed including, but not limited to, sore throat, hoarse voice, chipped/damaged teeth, injury to vocal cords, nausea and vomiting, allergic reactions, lung infection, heart attack, stroke, and death. All questions answered. )       Anesthesia Quick Evaluation

## 2023-07-17 NOTE — Progress Notes (Signed)
Heart Failure Navigator Progress Note  Assessed for Heart & Vascular TOC clinic readiness.  Patient does not meet criteria as he is an established patient with the Advanced Heart Failure clinic.   Navigator will sign off at this time.    Tavis Kring,RN, BSN,MSN Heart Failure Nurse Navigator. Contact by secure chat only.

## 2023-07-17 NOTE — Consult Note (Addendum)
Cardiology Consultation   Patient ID: Jason Floyd MRN: [id number]; DOB: 1966-10-07  Admit date: 07/16/2023 Date of Consult: 07/17/2023  PCP:  Sheliah Hatch, MD   [facility] Providers Cardiologist:  None  Electrophysiologist:  Sherryl Manges, MD  Advanced Heart Failure:  Arvilla Meres, MD  {    Patient Profile:   Jason Floyd is a 57 y.o. male with a hx of DM, chronic back pain, former smoker/poly substance abuse, NICM that had recovered >> CAD w/ICM, chronic CHF, HTN, HLD, PE, DM, hx of ?TIA, VTs, AFib who is being seen 07/17/2023 for the evaluation of ICD shocks at the request of Dr. Lendell Caprice.  Device information BSCi dual chamber ICD implanted 08/22/2020 Secondary prevention Hx of symptomatic VT (NSVTs towards 30 seconds)   - Echo 2008 EF 15% in setting of polysubstance abuse.  - ECHO 12/2007 EF 55-60% - Echo 10/20 EF 55-60% no RWMA.  - Echo 8/21 EF 25-30% - Cath 8/21 LAD 100% LCx 20% RCA 10% - cMRI 08/02/20 EF 26% Large anterior infarct - ICD placed 10/21 for VT - Echo 10/17/20 EF 30-35% - Echo 6/22 EF 40-45%  - Echo 05/10/22 EF 40-45% (read as 35-40%)  - Echo 05/29/23 EF 35-40% Personally reviewed  AAD hx Amiodarone started 2021 >> stopped Jan 2024 (seems 2/2 young age, ? 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 When the device clinic called he was really surprised that he had gotten therapy, had no idea. Yesterday prior to both shocks he did feel a wave of weakness ahead of the shock, no syncope, felt both Since has  felt fine without recurrent symptomes   History of Present Illness:   Mr. Kong saw Dr. [name] in July, doing well,  mild volume on board by device, no changes were made.  Device clinic yesterday with an alert for treated VT episode, pt reported some dizzy episodes of late, was asleep with ICD shock, unaware.  Planned to see him in the office for further evaluation the next day  He came to the ER with 2 more shocks and some ongoing episodes of dizziness.Seen b cardiology, device transmission reviewed with ATPs > shocks Started on amiodarone gtt Was in AFib Missed Eliquis doses x2 in the last month ? If inappropriate 2/2 rapid AF  LABS K+ 3.8 > 4.3 Mag 2.1, 2.0 BUN/Creat 18/1.27 > 1.27 HS Trop 87 > 82 WBC 9.6 H/H 15/44 Plts 237 TSH 0.023 Free T4 2.53 His synthroid was stopped 05/29/23, then it was 0.063 > 0.03 > today 0.023)    Device interrogation Battery and lead measurements are good Heart logic is 2 Foot print has shifter R Device reports zero AF AP zero VP zero  Presenting AFib/VS (70's) AFib CVR > tachy 250bpm > ATP > HV tx 41J > AFib CVR AFib CVR > tachy 253 > ATP > HV tx > AFib CVR AFib CVR > tachy > ATP > > AFib CVR AFib CVR > tachy > ATP > AFib CVR AFib CVR > tachy > ATP > AFib CVR  Morphologies of baseline AF ventricular EGM is not markedly different then his tachy Shock morphology is different  By EGMs he had SR Sept 1st He missed Eliquis 2 weeks ago Not since   Past Medical History:  Diagnosis Date   AICD (automatic cardioverter/defibrillator) present    Allergy    Arthritis    Atrial fibrillation (HCC)    CHF (congestive heart failure) (HCC)    CHF (congestive heart failure) (HCC)    Diabetes mellitus without complication (HCC)    Drug abuse (HCC)    Eczema    GERD (gastroesophageal reflux disease)    Gout    Heart attack (HCC) 06/10/2020   History of kidney stones    Hyperlipidemia    Hypertension    MI (myocardial infarction)  (HCC)    PE (pulmonary embolism)    TIA (transient ischemic attack)    hx of per pt - pt not aware he had not followed by a neurologist   Ventricular tachycardia [facility])     Past Surgical History:  Procedure Laterality Date   CERVICAL FUSION     ICD IMPLANT N/A 08/22/2020   Procedure: ICD IMPLANT;  Surgeon: Duke Salvia, MD;  Location: [facility] INVASIVE CV LAB;  Service: Cardiovascular;  Laterality: N/A;   KNEE ARTHROSCOPY Right 10/28/2020   Procedure: RIGHT KNEE ARTHROSCOPY, PARTIAL MEDIAL MENISCECTOMY, CHONDROPLASTY;  Surgeon: Marcene Corning, MD;  Location: [facility] ORS;  Service: Orthopedics;  Laterality: Right;   left thumb surgery      RIGHT/LEFT HEART CATH AND CORONARY ANGIOGRAPHY N/A 07/01/2020   Procedure: RIGHT/LEFT HEART CATH AND CORONARY ANGIOGRAPHY;  Surgeon: Dolores Patty, MD;  Location: [facility] INVASIVE CV LAB;  Service: Cardiovascular;  Laterality: N/A;   SHOULDER ARTHROSCOPY Left    SHOULDER ARTHROSCOPY WITH ROTATOR CUFF REPAIR AND SUBACROMIAL DECOMPRESSION Right 06/14/2022   Procedure: SHOULDER ARTHROSCOPY WITH ROTATOR CUFF REPAIR, SUBACROMIAL DECOMPRESSION AND TENOTOMY;  Surgeon: Jones Broom, MD;  Location: [facility] ORS;  Service: Orthopedics;  Laterality: Right;   TOTAL KNEE ARTHROPLASTY Right 10/03/2021   Procedure: RIGHT TOTAL KNEE ARTHROPLASTY;  Surgeon: Marcene Corning, MD;  Location: [facility] ORS;  Service: Orthopedics;  Laterality: Right;   VASECTOMY Bilateral    WISDOM TOOTH EXTRACTION       Home Medications:  Prior to Admission medications   Medication Sig Start Date End Date Taking? Authorizing Provider  allopurinol (ZYLOPRIM) 300 MG tablet TAKE 2 TABLETS (600 MG TOTAL) BY MOUTH DAILY. 01/02/23  Yes Sheliah Hatch, MD  aspirin EC 81 MG tablet Take 81 mg by mouth daily. Swallow whole.   Yes [provider]  atorvastatin (LIPITOR) 80 MG tablet Take 80 mg by mouth daily. 07/10/23  Yes [provider]  calcium carbonate (TUMS - DOSED IN MG ELEMENTAL CALCIUM)  500 MG chewable tablet Chew 2-3 tablets by mouth daily as needed for indigestion or heartburn.   Yes [provider]  carvedilol (COREG) 25 MG tablet Take 1 tablet (25 mg total) by mouth 2 (two) times daily. 01/01/23  Yes Graciella Freer, PA-C  cetirizine (ZYRTEC) 10 MG tablet Take 10 mg by mouth daily.   Yes [provider]  cyclobenzaprine (FLEXERIL) 10 MG tablet TAKE 1 TABLET BY MOUTH 3 TIMES DAILY AS NEEDED FOR MUSCLE SPASMS. Patient taking differently: Take 10 mg by mouth at bedtime. 07/10/23  Yes Sheliah Hatch, MD  ELIQUIS 5 MG TABS tablet TAKE 1 TABLET BY MOUTH 2 TIMES A DAY 09/04/22  Yes Bensimhon, Bevelyn Buckles, MD  ENTRESTO [phone]  MG TAKE 1 TABLET BY MOUTH 2 TIMES DAILY. 02/04/23  Yes Bensimhon, Bevelyn Buckles, MD  Evolocumab (REPATHA SURECLICK) 140 MG/ML SOAJ Inject 140 mg into the skin every 14 (fourteen) days. 02/04/23  Yes Bensimhon, Bevelyn Buckles, MD  FARXIGA 10 MG TABS tablet TAKE 1 TABLET BY MOUTH DAILY BEFORE BREAKFAST. 07/10/23  Yes Sheliah Hatch, MD  fenofibrate 160 MG tablet TAKE 1 TABLET BY MOUTH DAILY. 05/20/23  Yes Sheliah Hatch, MD  Magnesium Oxide 400 MG CAPS Take 1 capsule (400 mg total) by mouth in the morning and at bedtime. 01/02/23  Yes Graciella Freer, PA-C  methocarbamol (ROBAXIN) 500 MG tablet TAKE 1 TABLET BY MOUTH EVERY 6 HOURS AS NEEDED FOR MUSCLE SPASMS. 06/12/23  Yes Sheliah Hatch, MD  Naphazoline HCl (CLEAR EYES OP) Place 1 drop into both eyes daily as needed (sore eyes).   Yes [provider]  pantoprazole (PROTONIX) 40 MG tablet TAKE 1 TABLET BY MOUTH DAILY. 06/12/23  Yes Sheliah Hatch, MD  Semaglutide,0.25 or 0.5MG /DOS, (OZEMPIC, 0.25 OR 0.5 MG/DOSE,) 2 MG/3ML SOPN Inject 0.25 mg into the skin once a week. 06/21/23  Yes Bensimhon, Bevelyn Buckles, MD  sodium chloride (OCEAN) 0.65 % SOLN nasal spray Place 1 spray into both nostrils as needed (dryness).   Yes [provider]  spironolactone (ALDACTONE) 25 MG tablet  TAKE 1 TABLET (25 MG TOTAL) BY MOUTH DAILY. 01/02/23  Yes Bensimhon, Bevelyn Buckles, MD  traZODone (DESYREL) 100 MG tablet TAKE 1 TABLET (100 MG TOTAL) BY MOUTH AT BEDTIME. 05/20/23  Yes Sheliah Hatch, MD  VASCEPA 1 g capsule TAKE 2 CAPSULES BY MOUTH 2 TIMES DAILY. 06/12/23  Yes Bensimhon, Bevelyn Buckles, MD    Inpatient Medications: Scheduled Meds:  allopurinol  600 mg Oral Daily   apixaban  5 mg Oral BID   aspirin EC  81 mg Oral Daily   atorvastatin  80 mg Oral Daily   carvedilol  25 mg Oral BID WC   fenofibrate  160 mg Oral Daily   icosapent Ethyl  2 g Oral BID   insulin aspart  0-8 Units Subcutaneous TID WC   pantoprazole  40 mg Oral Daily   sacubitril-valsartan  1 tablet Oral BID   spironolactone  25 mg Oral Daily   traZODone  100 mg Oral QHS   Continuous Infusions:  amiodarone 30 mg/hr (07/17/23 0827)   PRN Meds: calcium carbonate  Allergies:   No Known Allergies  Social History:   Social History   Socioeconomic History   Marital status: Married    Spouse name: [name]   Number of children: Not on file   Years of education: Not on file   Highest education level: Bachelor's degree (e.g., BA, AB, BS)  Occupational History   Not on file  Tobacco Use   Smoking status: Some Days    Current packs/day: 0.25    Average packs/day: 0.3 packs/day for 31.0 years (7.8 ttl pk-yrs)    Types: Cigarettes   Smokeless tobacco: Never   Tobacco comments:    Stopped a few weeks ago  Vaping Use   Vaping status: Never Used  Substance and Sexual Activity   Alcohol use: Yes    Comment: once a month   Drug use: Not Currently    Types: Marijuana    Comment: gummies every days   Sexual activity: Not on file  Other Topics Concern   Not on file  Social History Narrative   Lives with wife  Caffeine- coffee 4 c daily   Social Determinants of Health   Financial Resource Strain: Low Risk  (07/01/2023)   Overall Financial Resource Strain (CARDIA)    Difficulty of Paying Living Expenses: Not  hard at all  Food Insecurity: No Food Insecurity (07/01/2023)   Hunger Vital Sign    Worried About Running Out of Food in the Last Year: Never true    Ran Out of Food in the Last Year: Never true  Transportation Needs: No Transportation Needs (07/01/2023)   PRAPARE - Administrator, Civil Service (Medical): No    Lack of Transportation (Non-Medical): No  Physical Activity: Insufficiently Active (07/01/2023)   Exercise Vital Sign    Days of Exercise per Week: 2 days    Minutes of Exercise per Session: 30 min  Stress: No Stress Concern Present (07/01/2023)   [employer] of Occupational Health - Occupational Stress Questionnaire    Feeling of Stress : Only a little  Social Connections: Moderately Isolated (07/01/2023)   Social Connection and Isolation Panel [NHANES]    Frequency of Communication with Friends and Family: Once a week    Frequency of Social Gatherings with Friends and Family: Once a week    Attends Religious Services: Never    Database administrator or Organizations: Yes    Attends Engineer, structural: More than 4 times per year    Marital Status: Married  Catering manager Violence: Not on file    Family History:   Family History  Problem Relation Age of Onset   Dementia Mother        frontal-temporal   Stroke Mother    Breast cancer Maternal Aunt    Other Brother        overdose   Colon cancer Neg Hx      ROS:  Please see the history of present illness.  All other ROS reviewed and negative.     Physical Exam/Data:   Vitals:   07/17/23 0600 07/17/23 0630 07/17/23 0657 07/17/23 0706  BP: 96/80 93/75  103/69  Pulse: 90 88 68 84  Resp: 15 15 17    Temp:   (!) 97.5 F (36.4 C) (!) 97.5 F (36.4 C)  TempSrc:   Oral Oral  SpO2: 95% 93% 96% 98%  Weight:      Height:       No intake or output data in the 24 hours ending 07/17/23 0925    07/17/2023    2:57 AM 07/05/2023   12:44 PM 05/29/2023    1:52 PM  Last 3 Weights  Weight (lbs)  232 lb 235 lb 234 lb 9.6 oz  Weight (kg) 105.235 kg 106.595 kg 106.414 kg     Body mass index is 29.79 kg/m.  General:  Well nourished, well developed, in no acute distress HEENT: normal Neck: no JVD Vascular: No carotid bruits; Distal pulses 2+ bilaterally Cardiac:  RRR; no murmurs, gallops or rubs Lungs:  CTA b/l, no wheezing, rhonchi or rales  Abd: soft, nontender Ext: no edema Musculoskeletal:  No deformities Skin: warm and dry  Neuro:  no focal abnormalities noted Psych:  Normal affect   EKG:  The EKG was personally reviewed and demonstrates:    AFib 101bpm, LAD, poor R progression  OLD 05/29/23 SR 79bpm, LAD, poor R progression   Telemetry:  Telemetry was personally reviewed and demonstrates:   AFib 80s, infrequent PVCs   Relevant CV Studies:  07/17/23: limited echo 1. Image quality makes assessment  of wall motion challenging. Left  ventricular ejection fraction, by estimation, is 35 to 40%. The left  ventricle has moderately decreased function. There is mild left  ventricular hypertrophy.   2. Right ventricular systolic function is normal. The right ventricular  size is normal.   Conclusion(s)/Recommendation(s): LVEF appears unchanged from prior study.    05/29/23: TTE  1. Left ventricular ejection fraction, by estimation, is 35 to 40%. The  left ventricle has moderately decreased function. The left ventricle  demonstrates global hypokinesis. The left ventricular internal cavity size  was moderately dilated. There is mild   left ventricular hypertrophy. Left ventricular diastolic parameters are  consistent with Grade I diastolic dysfunction (impaired relaxation).   2. Device leads in RA/RV. Right ventricular systolic function is normal.  The right ventricular size is normal.   3. Left atrial size was moderately dilated.   4. The mitral valve is abnormal. Trivial mitral valve regurgitation. No  evidence of mitral stenosis.   5. The aortic valve is tricuspid.  Aortic valve regurgitation is not  visualized. No aortic stenosis is present.   6. The inferior vena cava is normal in size with greater than 50%  respiratory variability, suggesting right atrial pressure of 3 mmHg.   07/01/20: c/MRI for viability  - LVEF 26% - LGE suggestive of a very large, wrap-around LAD infarction with extensive no-reflow affecting the subendocardium.  Laboratory Data:  High Sensitivity Troponin:   Recent Labs  Lab 07/16/23 2343 07/17/23 0208  TROPONINIHS 87* 82*     Chemistry Recent Labs  Lab 07/16/23 2342 07/16/23 2343 07/17/23 0424  NA  --  137 137  K  --  3.8 4.3  CL  --  102 101  CO2  --  24 25  GLUCOSE  --  303* 217*  BUN  --  18 16  CREATININE  --  1.27* 1.27*  CALCIUM  --  9.2 9.0  MG 2.1  --  2.0  GFRNONAA  --  >60 >60  ANIONGAP  --  11 11    Recent Labs  Lab 07/17/23 0424  PROT 6.0*  ALBUMIN 3.6  AST 15  ALT 27  ALKPHOS 42  BILITOT 0.4   Lipids No results for input(s): "CHOL", "TRIG", "HDL", "LABVLDL", "LDLCALC", "CHOLHDL" in the last 168 hours.  Hematology Recent Labs  Lab 07/16/23 2343 07/17/23 0424  WBC 10.0 9.6  RBC 5.25 4.91  HGB 16.0 15.0  HCT 48.0 44.8  MCV 91.4 91.2  MCH 30.5 30.5  MCHC 33.3 33.5  RDW 12.6 12.8  PLT 282 237   Thyroid  Recent Labs  Lab 07/17/23 0208  TSH 0.023*  FREET4 2.53*    BNPNo results for input(s): "BNP", "PROBNP" in the last 168 hours.  DDimer No results for input(s): "DDIMER" in the last 168 hours.   Radiology/Studies:  DG Chest 2 View  Result Date: 07/16/2023 CLINICAL DATA:  Chest tightness.  Defibrillator has gone off. EXAM: CHEST - 2 VIEW COMPARISON:  09/25/2021 FINDINGS: Dual lead left-sided pacemaker with lead tips projecting over the right atrium and ventricle. Heart size upper normal. Normal mediastinal contours. No pulmonary edema, pleural effusion, or pneumothorax. No focal airspace disease. No acute osseous abnormalities IMPRESSION: 1. No acute chest findings. 2.  Borderline cardiomegaly. Electronically Signed   By: Narda Rutherford M.D.   On: 07/16/2023 23:56     Assessment and Plan:   ICD shocks/therapies VT Appropriate therapy, will try to avoid amiodarone > stop Start mexiletine  No  driving 6 mo, he is aware of [place] law   Paroxysmal Afib, CHA2DS2Vasc is 8, on eliquis He was in SR  to AFib by EGM 07/14/23 >> no missed doses of eliquis in the last 2 weeks (missed 2 doses 2 weeks ago, not since then) Given he did not convert with HV therapies, unclear if a role for DCCV Will keep him NPO after MN and discuss with Dr. Graciela Husbands  Unknown burden device is programmed VVI   CAD No ischemic symptoms Flat HS Trop   ICM Appears euvolemic Home meds   6. Hyperthyroid Off levothyroxine since 05/29/23 Unclear if amiodarone played any role in his developing thyroid issues    Risk Assessment/Risk Scores:     For questions or updates, please contact [facility] Please consult [email or web address] for contact info under    Signed, Sheilah Pigeon, PA-C  07/17/2023 9:25 AM

## 2023-07-18 ENCOUNTER — Encounter (HOSPITAL_COMMUNITY)
Admission: EM | Disposition: A | Discharge status: Home / Self Care | Payer: Self-pay | Source: Home / Self Care | Attending: Cardiology

## 2023-07-18 ENCOUNTER — Inpatient Hospital Stay (HOSPITAL_COMMUNITY): Payer: BC Managed Care – PPO | Admitting: Anesthesiology

## 2023-07-18 ENCOUNTER — Encounter (HOSPITAL_COMMUNITY): Payer: Self-pay | Admitting: Student in an Organized Health Care Education/Training Program

## 2023-07-18 ENCOUNTER — Inpatient Hospital Stay (HOSPITAL_COMMUNITY): Payer: Self-pay | Admitting: Anesthesiology

## 2023-07-18 ENCOUNTER — Other Ambulatory Visit: Payer: Self-pay

## 2023-07-18 ENCOUNTER — Inpatient Hospital Stay (HOSPITAL_COMMUNITY): Payer: BC Managed Care – PPO

## 2023-07-18 DIAGNOSIS — I472 Ventricular tachycardia, unspecified: Secondary | ICD-10-CM | POA: Diagnosis not present

## 2023-07-18 DIAGNOSIS — I959 Hypotension, unspecified: Secondary | ICD-10-CM

## 2023-07-18 DIAGNOSIS — I5022 Chronic systolic (congestive) heart failure: Secondary | ICD-10-CM

## 2023-07-18 DIAGNOSIS — I340 Nonrheumatic mitral (valve) insufficiency: Secondary | ICD-10-CM

## 2023-07-18 DIAGNOSIS — I4891 Unspecified atrial fibrillation: Secondary | ICD-10-CM | POA: Diagnosis not present

## 2023-07-18 HISTORY — PX: TEE WITHOUT CARDIOVERSION: SHX5443

## 2023-07-18 HISTORY — PX: CARDIOVERSION: SHX1299

## 2023-07-18 LAB — MRSA NEXT GEN BY PCR, NASAL: MRSA by PCR Next Gen: NOT DETECTED

## 2023-07-18 LAB — GLUCOSE, CAPILLARY
Glucose-Capillary: 201 mg/dL — ABNORMAL HIGH (ref 70–99)
Glucose-Capillary: 164 mg/dL — ABNORMAL HIGH (ref 70–99)
Glucose-Capillary: 154 mg/dL — ABNORMAL HIGH (ref 70–99)
Glucose-Capillary: 271 mg/dL — ABNORMAL HIGH (ref 70–99)
Glucose-Capillary: 150 mg/dL — ABNORMAL HIGH (ref 70–99)

## 2023-07-18 SURGERY — CARDIOVERSION
Anesthesia: General

## 2023-07-18 MED ORDER — PHENYLEPHRINE 80 MCG/ML (10ML) SYRINGE FOR IV PUSH (FOR BLOOD PRESSURE SUPPORT)
PREFILLED_SYRINGE | INTRAVENOUS | Status: DC | PRN
Start: 2023-07-18 — End: 2023-07-18
  Administered 2023-07-18: 80 ug via INTRAVENOUS
  Administered 2023-07-18: 160 ug via INTRAVENOUS
  Administered 2023-07-18: 80 ug via INTRAVENOUS
  Administered 2023-07-18: 160 ug via INTRAVENOUS

## 2023-07-18 MED ORDER — NOREPINEPHRINE 4 MG/250ML-% IV SOLN
0.0000 ug/min | INTRAVENOUS | Status: DC
  Administered 2023-07-18: 5 ug/min via INTRAVENOUS

## 2023-07-18 MED ORDER — PHENYLEPHRINE HCL-NACL 20-0.9 MG/250ML-% IV SOLN
INTRAVENOUS | Status: DC | PRN
Start: 2023-07-18 — End: 2023-07-18
  Administered 2023-07-18: 25 ug/min via INTRAVENOUS

## 2023-07-18 MED ORDER — PROPOFOL 500 MG/50ML IV EMUL
INTRAVENOUS | Status: DC | PRN
  Administered 2023-07-18: 100 ug/kg/min via INTRAVENOUS

## 2023-07-18 MED ORDER — NOREPINEPHRINE 4 MG/250ML-% IV SOLN
INTRAVENOUS | Status: DC | PRN
Start: 2023-07-18 — End: 2023-07-18
  Administered 2023-07-18: 2 ug/min via INTRAVENOUS

## 2023-07-18 MED ORDER — CHLORHEXIDINE GLUCONATE CLOTH 2 % EX PADS
6.0000 | MEDICATED_PAD | Freq: Every day | CUTANEOUS | Status: DC
  Administered 2023-07-18 – 2023-07-19 (×2): 6 via TOPICAL

## 2023-07-18 MED ORDER — LIDOCAINE 2% (20 MG/ML) 5 ML SYRINGE
INTRAMUSCULAR | Status: DC | PRN
  Administered 2023-07-18: 60 mg via INTRAVENOUS

## 2023-07-18 MED ORDER — SODIUM CHLORIDE 0.9 % IV SOLN: 250.0000 mL | INTRAVENOUS | Status: DC

## 2023-07-18 MED ORDER — AMIODARONE LOAD VIA INFUSION
150.0000 mg | Freq: Once | INTRAVENOUS | Status: AC
  Administered 2023-07-18: 150 mg via INTRAVENOUS
  Filled 2023-07-18: qty 83.34

## 2023-07-18 MED ORDER — DEXMEDETOMIDINE HCL IN NACL 80 MCG/20ML IV SOLN
INTRAVENOUS | Status: DC | PRN
  Administered 2023-07-18: 8 ug via INTRAVENOUS
  Administered 2023-07-18: 4 ug via INTRAVENOUS
  Administered 2023-07-18: 8 ug via INTRAVENOUS

## 2023-07-18 MED ORDER — AMIODARONE HCL IN DEXTROSE 360-4.14 MG/200ML-% IV SOLN
60.0000 mg/h | INTRAVENOUS | Status: DC
  Administered 2023-07-18: 60 mg/h via INTRAVENOUS
  Filled 2023-07-18 (×2): qty 200

## 2023-07-18 MED ORDER — ALBUMIN HUMAN 5 % IV SOLN
INTRAVENOUS | Status: DC | PRN
Start: 2023-07-18 — End: 2023-07-18

## 2023-07-18 MED ORDER — APIXABAN 5 MG PO TABS
5.0000 mg | ORAL_TABLET | Freq: Two times a day (BID) | ORAL | Status: DC
  Administered 2023-07-18 – 2023-07-19 (×3): 5 mg via ORAL
  Filled 2023-07-18 (×3): qty 1

## 2023-07-18 MED ORDER — SODIUM CHLORIDE 0.9 % IV SOLN: INTRAVENOUS | Status: DC

## 2023-07-18 MED ORDER — AMIODARONE HCL IN DEXTROSE 360-4.14 MG/200ML-% IV SOLN
30.0000 mg/h | INTRAVENOUS | Status: DC
  Administered 2023-07-19: 30 mg/h via INTRAVENOUS
  Filled 2023-07-18 (×2): qty 200

## 2023-07-18 MED ORDER — NOREPINEPHRINE 4 MG/250ML-% IV SOLN
INTRAVENOUS | Status: AC
  Filled 2023-07-18: qty 250

## 2023-07-18 MED ORDER — PROPOFOL 10 MG/ML IV BOLUS
INTRAVENOUS | Status: DC | PRN
  Administered 2023-07-18: 20 mg via INTRAVENOUS
  Administered 2023-07-18: 30 mg via INTRAVENOUS
  Administered 2023-07-18: 15 mg via INTRAVENOUS
  Administered 2023-07-18: 25 mg via INTRAVENOUS

## 2023-07-18 SURGICAL SUPPLY — 1 items: ELECT DEFIB PAD ADLT CADENCE (PAD) ×1 IMPLANT

## 2023-07-18 NOTE — Progress Notes (Addendum)
Rounding Note    Patient Name: Jason Floyd Date of Encounter: 07/18/2023  [facility] Cardiologist:   Subjective   Feels fine, no CP, no SOB  Inpatient Medications    Scheduled Meds:  allopurinol  600 mg Oral Daily   apixaban  5 mg Oral BID   aspirin EC  81 mg Oral Daily   atorvastatin  80 mg Oral Daily   carvedilol  25 mg Oral BID WC   fenofibrate  160 mg Oral Daily   icosapent Ethyl  2 g Oral BID   insulin aspart  0-8 Units Subcutaneous TID WC   magnesium oxide  400 mg Oral BID   mexiletine  150 mg Oral Q8H   pantoprazole  40 mg Oral Daily   sacubitril-valsartan  1 tablet Oral BID   spironolactone  25 mg Oral Daily   traZODone  100 mg Oral QHS   Continuous Infusions:  PRN Meds: calcium carbonate   Vital Signs    Vitals:   07/17/23 1437 07/17/23 1922 07/17/23 1950 07/18/23 0447  BP: (!) 81/62 98/76 100/78 90/70  Pulse:  74  83  Resp:  16  18  Temp: 97.7 F (36.5 C) 97.7 F (36.5 C)  97.8 F (36.6 C)  TempSrc: Axillary Oral  Oral  SpO2:  96%  96%  Weight:    105.2 kg  Height:        Intake/Output Summary (Last 24 hours) at 07/18/2023 0831 Last data filed at 07/17/2023 2000 Gross per 24 hour  Intake 565.17 ml  Output 1400 ml  Net -834.83 ml      07/18/2023    4:47 AM 07/17/2023    2:57 AM 07/05/2023   12:44 PM  Last 3 Weights  Weight (lbs) 231 lb 14.8 oz 232 lb 235 lb  Weight (kg) 105.2 kg 105.235 kg 106.595 kg      Telemetry    AFib 80's - Personally Reviewed  ECG    No new EKGs - Personally Reviewed  Physical Exam   GEN: No acute distress.   Neck: No JVD Cardiac: irreg-irreg, no murmurs, rubs, or gallops.  Respiratory: CTA b/l. GI: Soft, nontender, non-distended  MS: No edema Neuro:  Nonfocal  Psych: Normal affect   Labs    High Sensitivity Troponin:   Recent Labs  Lab 07/16/23 2343 07/17/23 0208  TROPONINIHS 87* 82*     Chemistry Recent Labs  Lab 07/16/23 2342 07/16/23 2343 07/17/23 0424  NA  --  137 137   K  --  3.8 4.3  CL  --  102 101  CO2  --  24 25  GLUCOSE  --  303* 217*  BUN  --  18 16  CREATININE  --  1.27* 1.27*  CALCIUM  --  9.2 9.0  MG 2.1  --  2.0  PROT  --   --  6.0*  ALBUMIN  --   --  3.6  AST  --   --  15  ALT  --   --  27  ALKPHOS  --   --  42  BILITOT  --   --  0.4  GFRNONAA  --  >60 >60  ANIONGAP  --  11 11    Lipids No results for input(s): "CHOL", "TRIG", "HDL", "LABVLDL", "LDLCALC", "CHOLHDL" in the last 168 hours.  Hematology Recent Labs  Lab 07/16/23 2343 07/17/23 0424  WBC 10.0 9.6  RBC 5.25 4.91  HGB 16.0 15.0  HCT 48.0 44.8  MCV 91.4 91.2  MCH 30.5 30.5  MCHC 33.3 33.5  RDW 12.6 12.8  PLT 282 237   Thyroid  Recent Labs  Lab 07/17/23 0208  TSH 0.023*  FREET4 2.53*    BNPNo results for input(s): "BNP", "PROBNP" in the last 168 hours.  DDimer No results for input(s): "DDIMER" in the last 168 hours.   Radiology     DG Chest 2 View Result Date: 07/16/2023 CLINICAL DATA:  Chest tightness.  Defibrillator has gone off. EXAM: CHEST - 2 VIEW COMPARISON:  09/25/2021 FINDINGS: Dual lead left-sided pacemaker with lead tips projecting over the right atrium and ventricle. Heart size upper normal. Normal mediastinal contours. No pulmonary edema, pleural effusion, or pneumothorax. No focal airspace disease. No acute osseous abnormalities IMPRESSION: 1. No acute chest findings. 2. Borderline cardiomegaly. Electronically Signed   By: Narda Rutherford M.D.   On: 07/16/2023 23:56    Cardiac Studies   07/17/23: limited echo 1. Image quality makes assessment of wall motion challenging. Left  ventricular ejection fraction, by estimation, is 35 to 40%. The left  ventricle has moderately decreased function. There is mild left  ventricular hypertrophy.   2. Right ventricular systolic function is normal. The right ventricular  size is normal.   Conclusion(s)/Recommendation(s): LVEF appears unchanged from prior study.      05/29/23: TTE  1. Left ventricular  ejection fraction, by estimation, is 35 to 40%. The  left ventricle has moderately decreased function. The left ventricle  demonstrates global hypokinesis. The left ventricular internal cavity size  was moderately dilated. There is mild   left ventricular hypertrophy. Left ventricular diastolic parameters are  consistent with Grade I diastolic dysfunction (impaired relaxation).   2. Device leads in RA/RV. Right ventricular systolic function is normal.  The right ventricular size is normal.   3. Left atrial size was moderately dilated.   4. The mitral valve is abnormal. Trivial mitral valve regurgitation. No  evidence of mitral stenosis.   5. The aortic valve is tricuspid. Aortic valve regurgitation is not  visualized. No aortic stenosis is present.   6. The inferior vena cava is normal in size with greater than 50%  respiratory variability, suggesting right atrial pressure of 3 mmHg.    07/01/20: c/MRI for viability  - LVEF 26% - LGE suggestive of a very large, wrap-around LAD infarction with extensive no-reflow affecting the subendocardium.   Patient Profile     57 y.o. male with a hx of DM, chronic back pain, former smoker/poly substance abuse, NICM that had recovered >> CAD w/ICM, chronic CHF, HTN, HLD, PE, DM, hx of ?TIA, VTs, AFib admitted w/ICD shocks/VT   Device information BSCi dual chamber ICD implanted 08/22/2020 Secondary prevention Hx of symptomatic VT (NSVTs towards 30 seconds)   AAD hx Amiodarone started 2021 >> stopped Jan 2024 (seems 2/2 young age, ? Hypothyroidism) Feb 2024 > appropriate tx for VT (suspect 2/2 binge drinking that weekend), AAD not started, suspect "triggered event"   This admission Mexiletine added  Assessment & Plan    ICD shocks/therapies VT Appropriate therapy, will try to avoid amiodarone > stop Started mexiletine   No driving 6 mo, he is aware of [place] law     Paroxysmal Afib, CHA2DS2Vasc is 8, on eliquis He was in SR  to AFib by  EGM 07/14/23   This morning in d/w Dr. Jimmey Ralph, Eliquis compliance/reliability seems a little less certain Plan for TEE/DCCV  Informed Consent   Shared Decision Making/Informed Consent The risks Gabriel Rung,  cardiac arrhythmias rarely resulting in the need for a temporary or permanent pacemaker, skin irritation or burns, esophageal damage, perforation (1:10,000 risk), bleeding, pharyngeal hematoma as well as other potential complications associated with conscious sedation including aspiration, arrhythmia, respiratory failure and death], benefits (treatment guidance, restoration of normal sinus rhythm, diagnostic support) and alternatives of a transesophageal echocardiogram guided cardioversion were discussed in detail with Mr. Disotell and he is willing to proceed.       I have discussed with cath lab/RN we have changed his DCCV >> TEE/DCCV the team is aware He did NOT take his Eliquis (night meds) prior to coming in, is getting his Eliquis here (Last dose of Ozempic was last week Wed)  Device programmed DDD 50 (with a mode switch rate of 40) to get a better assessment of his AF burden going forward.  Would like to add amiodarone to treat both his AF and VT Will resume his prior low dose (after his TEE/DCCV) Will need to see his PMD > refer to endo for their input as well. We can also reach out to Dr. Graciela Husbands for his thoughts/recommendations    CAD No ischemic symptoms Flat HS Trop     ICM Appears euvolemic Home meds     6. Hyperthyroid Off levothyroxine since 05/29/23 Unclear if amiodarone played any role in his developing thyroid issues   7. DM  SSI while here   For questions or updates, please contact [facility] Please consult [email or web address] for contact info under        Signed, Sheilah Pigeon, PA-C  07/18/2023, 8:31 AM

## 2023-07-18 NOTE — TOC Initial Note (Addendum)
Transition of Care [facility]) - Initial/Assessment Note    Patient Details  Name: Jason Floyd MRN: [id number] Date of Birth: 1966-01-06  Transition of [facility]) CM/SW Contact:    Jason Floyd Phone Number: [phone] 07/18/2023, 4:18 PM  Clinical Narrative:  HF CSW met with the pt at bedside. Pt stated that he is independent prior to this hospitalization. 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 Pt stated that he has no history of HH services and no use of equipment. Pt has a scale at home. CSW explained that a follow up hospital PCP appointment will be scheduled closer to dc. TOC will continue following.                  Expected Discharge Plan: Home/Self Care Barriers to Discharge: Continued Medical Work up   Patient Goals and CMS Choice Patient states their goals for this hospitalization and ongoing recovery are:: return home          Expected Discharge Plan and Services       Living arrangements for the past 2 months: Single Family Home                                      Prior Living Arrangements/Services Living arrangements for the past 2 months: Single Family Home Lives with:: Pets, Spouse Patient language and need for interpreter reviewed:: Yes Do you feel safe going back to the place where you live?: Yes      Need for Family Participation in Patient Care: Yes (Comment) Care giver support system in place?: Yes (comment)   Criminal Activity/Legal Involvement Pertinent to Current Situation/Hospitalization: No - Comment as needed  Activities of Daily Living Home Assistive Devices/Equipment: None ADL Screening (condition at time of admission) Patient's cognitive ability adequate to safely complete daily activities?: Yes Is the patient deaf or have difficulty hearing?: No Does the patient have difficulty seeing, even when wearing glasses/contacts?: No Does the patient have difficulty concentrating, remembering, or making decisions?: No Patient able  to express need for assistance with ADLs?: Yes Does the patient have difficulty dressing or bathing?: No Independently performs ADLs?: Yes (appropriate for developmental age) Does the patient have difficulty walking or climbing stairs?: No Weakness of Legs: None Weakness of Arms/Hands: None  Permission Sought/Granted                  Emotional Assessment Appearance:: Appears stated age Attitude/Demeanor/Rapport: Engaged Affect (typically observed): Appropriate Orientation: : Oriented to Self, Oriented to Place, Oriented to Situation Alcohol / Substance Use: Not Applicable Psych Involvement: No (comment)  Admission diagnosis:  Ventricular tachycardia (HCC) [I47.20] AICD discharge [Z45.02] Patient Active Problem List   Diagnosis Date Noted   Ventricular tachycardia (HCC) 07/17/2023   Hypothyroid 07/25/2022   Primary osteoarthritis of right knee 10/03/2021   Primary localized osteoarthritis of right knee 10/03/2021   Ischemic cardiomyopathy 03/09/2021   Persistent atrial fibrillation (HCC) 03/09/2021   ICD (implantable cardioverter-defibrillator) in place - BS 12/05/2020   Coronary artery disease involving native coronary artery of native heart without angina pectoris 09/21/2020   Chronic combined systolic and diastolic congestive heart failure (HCC) 09/21/2020   V-tach (HCC) 09/21/2020   Nasal septal perforation 08/10/2020   TIA (transient ischemic attack) 06/17/2020   GERD (gastroesophageal reflux disease) 08/05/2019   HTN (hypertension) 04/16/2018   Insomnia 01/22/2018   Controlled diabetes mellitus type 2 with complications (HCC) 07/07/2015  Thoracic back pain 04/19/2014   Cervical radiculopathy 08/21/2012   Routine general medical examination at a health care facility 04/17/2012   Hyperlipidemia 04/17/2012   OBESITY 12/07/2009   FASCIITIS, PLANTAR 03/29/2009   PULMONARY EMBOLISM, HX OF 11/18/2008   NEPHROLITHIASIS, HX OF 11/18/2008   DRUG ABUSE, HX OF 11/18/2008    GOUT 03/22/2008   NUMMULAR ECZEMA 03/22/2008   PCP:  Jason Hatch, MD Pharmacy:   [facility] - [place], [place] - [address] [place] [place] [address] Phone: [phone] Fax: [phone]     Social Determinants of Health (SDOH) Social History: SDOH Screenings   Food Insecurity: No Food Insecurity (07/17/2023)  Housing: Low Risk  (07/17/2023)  Transportation Needs: No Transportation Needs (07/17/2023)  Utilities: Not At Risk (07/17/2023)  Alcohol Screen: Low Risk  (07/01/2023)  Depression (PHQ2-9): Low Risk  (07/05/2023)  Financial Resource Strain: Low Risk  (07/01/2023)  Physical Activity: Insufficiently Active (07/01/2023)  Social Connections: Moderately Isolated (07/01/2023)  Stress: No Stress Concern Present (07/01/2023)  Tobacco Use: High Risk (07/18/2023)   SDOH Interventions:     Readmission Risk Interventions     No data to display

## 2023-07-18 NOTE — Progress Notes (Signed)
  Echocardiogram Echocardiogram Transesophageal has been performed.  Jason Floyd 07/18/2023, 12:38 PM

## 2023-07-18 NOTE — Anesthesia Procedure Notes (Signed)
Procedure Name: MAC Date/Time: 07/18/2023 11:59 AM  Performed by: Waynard Edwards, CRNAPre-anesthesia Checklist: Patient identified, Emergency Drugs available, Suction available and Patient being monitored Patient Re-evaluated:Patient Re-evaluated prior to induction Oxygen Delivery Method: Nasal cannula Induction Type: IV induction Placement Confirmation: positive ETCO2 Dental Injury: Teeth and Oropharynx as per pre-operative assessment

## 2023-07-18 NOTE — Consult Note (Addendum)
Advanced Heart Failure Team Consult Note   Primary Physician: Sheliah Hatch, MD PCP-Cardiologist:  None  Reason for Consultation: Hypotension, chronic systolic heart failure  HPI:    Jason Floyd is seen today for evaluation of hypotension, chronic systolic heart failure at the request of Dr. Antoine Poche, cardiology.   Mr Boat is 57 year old with a PMH of chronic systolic heart failure due to NICM, DM2, former polysubstance and tobacco abuse.     On 06/07/20 had TIA-like symptoms with mild aphasia and weakness (dropped a can with left hand). Went to PCP had carotid u/s and echo. Carotid u/s 06/22/20 1-39% bilaterally. Echo 8/21 EF down to 25-30%. Brain MRI/MRA. Normal vasculature.  Left corona radiata diffusion-weighted/FLAIR hyperintensity is nonspecific. Differential includes active demyelinating lesionversus subacute insult.    Zio monitored placed for palpitations in 10/21 and found to have VT. Underwent ICD placement by Dr. Graciela Husbands on 08/22/20. Was also found to have volume overload and AF.  Presented to the ED 9/4 after being notified by his cardiologist's office about receiving an ICD shock. Patient was unaware of this first event. 9/3 he got shocked 2 more times while at home. Prior to this he was asymptomatic aside from some ongoing fatigue. Denies CP or SOB. Pertinent ED labs reviewed: HsTrop 87>82, SCr 1.27, K 3.8, Mg 2.1, TSH 0.023, CXR with no acute findings. EKG with a fib RVR. Device interrogation showed a fib, shock 9/1, ATP 9/2, 3 shocks 9/3. Had missed several eliquis doses so decision made for TEE/DCCV.   Today patient underwent TEE/DCCV > NSR. Post procedure he required levo and neo to support his BP as he was hypotensive. Duue to inability to wean off pressors was transferred to [facility].    Resting comfortably in bed. On 5 of levo, neo paused.   Cardiac studies reviewed:  TEE showed EF 25%, LAA no clot, mild MR/TR, small pericardial effusion Echo 05/29/23 EF 35-40%  Personally reviewed 8/21 cMRI.for viability LVEF 26%. LGE suggestive of a very large, wrap-around LAD infarction with extensive no-reflow affecting the subendocardium.  R/L cath 07/01/20: LAD 100% prox, LCX 20%, RCA 10% , EF 25-30%, Ao = 112/72 (90), LV = 111/27, RA = 2, RV = 46/7, PA = 44/12 (27), PCW = 16, Fick cardiac output/index = 6.8/2.9, PVR = 1.5 WU, SVR 1047, Ao sat = 96%, PA sat = 70%,, 78%, SVC sat = 74% Carotid u/s 06/22/20 1-39% bilaterally.  Echo 8/21 EF to 25-30% Echo 10/20 EF 55-60% no RWMA.  ECHO 12/2007 EF 55-60% Echo 2008 EF 15% in setting of polysubstance abuse.    Home Medications Prior to Admission medications   Medication Sig Start Date End Date Taking? Authorizing Provider  allopurinol (ZYLOPRIM) 300 MG tablet TAKE 2 TABLETS (600 MG TOTAL) BY MOUTH DAILY. 01/02/23  Yes Sheliah Hatch, MD  aspirin EC 81 MG tablet Take 81 mg by mouth daily. Swallow whole.   Yes [provider]  atorvastatin (LIPITOR) 80 MG tablet Take 80 mg by mouth daily. 07/10/23  Yes [provider]  calcium carbonate (TUMS - DOSED IN MG ELEMENTAL CALCIUM) 500 MG chewable tablet Chew 2-3 tablets by mouth daily as needed for indigestion or heartburn.   Yes [provider]  carvedilol (COREG) 25 MG tablet Take 1 tablet (25 mg total) by mouth 2 (two) times daily. 01/01/23  Yes Graciella Freer, PA-C  cetirizine (ZYRTEC) 10 MG tablet Take 10 mg by mouth daily.   Yes [provider]  cyclobenzaprine (FLEXERIL) 10 MG tablet TAKE 1 TABLET BY MOUTH 3 TIMES DAILY AS NEEDED FOR MUSCLE SPASMS. Patient taking differently: Take 10 mg by mouth at bedtime. 07/10/23  Yes Sheliah Hatch, MD  ELIQUIS 5 MG TABS tablet TAKE 1 TABLET BY MOUTH 2 TIMES A DAY 09/04/22  Yes Shameika Speelman, Bevelyn Buckles, MD  ENTRESTO 49-51 MG TAKE 1 TABLET BY MOUTH 2 TIMES DAILY. 02/04/23  Yes Darleene Cumpian, Bevelyn Buckles, MD  Evolocumab (REPATHA SURECLICK) 140 MG/ML SOAJ Inject 140 mg into the skin every 14  (fourteen) days. 02/04/23  Yes Tiernan Suto, Bevelyn Buckles, MD  FARXIGA 10 MG TABS tablet TAKE 1 TABLET BY MOUTH DAILY BEFORE BREAKFAST. 07/10/23  Yes Sheliah Hatch, MD  fenofibrate 160 MG tablet TAKE 1 TABLET BY MOUTH DAILY. 05/20/23  Yes Sheliah Hatch, MD  Magnesium Oxide 400 MG CAPS Take 1 capsule (400 mg total) by mouth in the morning and at bedtime. 01/02/23  Yes Graciella Freer, PA-C  methocarbamol (ROBAXIN) 500 MG tablet TAKE 1 TABLET BY MOUTH EVERY 6 HOURS AS NEEDED FOR MUSCLE SPASMS. 06/12/23  Yes Sheliah Hatch, MD  Naphazoline HCl (CLEAR EYES OP) Place 1 drop into both eyes daily as needed (sore eyes).   Yes [provider]  pantoprazole (PROTONIX) 40 MG tablet TAKE 1 TABLET BY MOUTH DAILY. 06/12/23  Yes Sheliah Hatch, MD  Semaglutide,0.25 or 0.5MG /DOS, (OZEMPIC, 0.25 OR 0.5 MG/DOSE,) 2 MG/3ML SOPN Inject 0.25 mg into the skin once a week. 06/21/23  Yes Jahmez Bily, Bevelyn Buckles, MD  sodium chloride (OCEAN) 0.65 % SOLN nasal spray Place 1 spray into both nostrils as needed (dryness).   Yes [provider]  spironolactone (ALDACTONE) 25 MG tablet TAKE 1 TABLET (25 MG TOTAL) BY MOUTH DAILY. 01/02/23  Yes Charis Juliana, Bevelyn Buckles, MD  traZODone (DESYREL) 100 MG tablet TAKE 1 TABLET (100 MG TOTAL) BY MOUTH AT BEDTIME. 05/20/23  Yes Sheliah Hatch, MD  VASCEPA 1 g capsule TAKE 2 CAPSULES BY MOUTH 2 TIMES DAILY. 06/12/23  Yes Alveena Taira, Bevelyn Buckles, MD    Past Medical History: Past Medical History:  Diagnosis Date   AICD (automatic cardioverter/defibrillator) present    Allergy    Arthritis    Atrial fibrillation (HCC)    CHF (congestive heart failure) (HCC)    CHF (congestive heart failure) (HCC)    Diabetes mellitus without complication (HCC)    Drug abuse (HCC)    Eczema    GERD (gastroesophageal reflux disease)    Gout    Heart attack (HCC) 06/10/2020   History of kidney stones    Hyperlipidemia    Hypertension    MI (myocardial infarction) (HCC)    PE  (pulmonary embolism)    TIA (transient ischemic attack)    hx of per pt - pt not aware he had not followed by a neurologist   Ventricular tachycardia [facility])     Past Surgical History: Past Surgical History:  Procedure Laterality Date   CERVICAL FUSION     ICD IMPLANT N/A 08/22/2020   Procedure: ICD IMPLANT;  Surgeon: Duke Salvia, MD;  Location: [facility] INVASIVE CV LAB;  Service: Cardiovascular;  Laterality: N/A;   KNEE ARTHROSCOPY Right 10/28/2020   Procedure: RIGHT KNEE ARTHROSCOPY, PARTIAL MEDIAL MENISCECTOMY, CHONDROPLASTY;  Surgeon: Marcene Corning, MD;  Location: [facility] ORS;  Service: Orthopedics;  Laterality: Right;   left thumb surgery      RIGHT/LEFT HEART CATH AND CORONARY ANGIOGRAPHY N/A 07/01/2020   Procedure: RIGHT/LEFT HEART CATH AND CORONARY  ANGIOGRAPHY;  Surgeon: Dolores Patty, MD;  Location: [facility] INVASIVE CV LAB;  Service: Cardiovascular;  Laterality: N/A;   SHOULDER ARTHROSCOPY Left    SHOULDER ARTHROSCOPY WITH ROTATOR CUFF REPAIR AND SUBACROMIAL DECOMPRESSION Right 06/14/2022   Procedure: SHOULDER ARTHROSCOPY WITH ROTATOR CUFF REPAIR, SUBACROMIAL DECOMPRESSION AND TENOTOMY;  Surgeon: Jones Broom, MD;  Location: [facility] ORS;  Service: Orthopedics;  Laterality: Right;   TOTAL KNEE ARTHROPLASTY Right 10/03/2021   Procedure: RIGHT TOTAL KNEE ARTHROPLASTY;  Surgeon: Marcene Corning, MD;  Location: [facility]S;  Service: Orthopedics;  Laterality: Right;   VASECTOMY Bilateral    WISDOM TOOTH EXTRACTION      Family History: Family History  Problem Relation Age of Onset   Dementia Mother        frontal-temporal   Stroke Mother    Breast cancer Maternal Aunt    Other Brother        overdose   Colon cancer Neg Hx     Social History: Social History   Socioeconomic History   Marital status: Married    Spouse name: [name]   Number of children: Not on file   Years of education: Not on file   Highest education level: Bachelor's degree (e.g., BA, AB, BS)  Occupational History    Not on file  Tobacco Use   Smoking status: Some Days    Current packs/day: 0.25    Average packs/day: 0.3 packs/day for 31.0 years (7.8 ttl pk-yrs)    Types: Cigarettes   Smokeless tobacco: Never   Tobacco comments:    Stopped a few weeks ago  Vaping Use   Vaping status: Never Used  Substance and Sexual Activity   Alcohol use: Yes    Comment: once a month   Drug use: Not Currently    Types: Marijuana    Comment: gummies every days   Sexual activity: Not on file  Other Topics Concern   Not on file  Social History Narrative   Lives with wife   Caffeine- coffee 4 c daily   Social Determinants of Health   Financial Resource Strain: Low Risk  (07/01/2023)   Overall Financial Resource Strain (CARDIA)    Difficulty of Paying Living Expenses: Not hard at all  Food Insecurity: No Food Insecurity (07/17/2023)   Hunger Vital Sign    Worried About Running Out of Food in the Last Year: Never true    Ran Out of Food in the Last Year: Never true  Transportation Needs: No Transportation Needs (07/17/2023)   PRAPARE - Administrator, Civil Service (Medical): No    Lack of Transportation (Non-Medical): No  Physical Activity: Insufficiently Active (07/01/2023)   Exercise Vital Sign    Days of Exercise per Week: 2 days    Minutes of Exercise per Session: 30 min  Stress: No Stress Concern Present (07/01/2023)   [employer] of Occupational Health - Occupational Stress Questionnaire    Feeling of Stress : Only a little  Social Connections: Moderately Isolated (07/01/2023)   Social Connection and Isolation Panel [NHANES]    Frequency of Communication with Friends and Family: Once a week    Frequency of Social Gatherings with Friends and Family: Once a week    Attends Religious Services: Never    Database administrator or Organizations: Yes    Attends Engineer, structural: More than 4 times per year    Marital Status: Married    Allergies:  No Known  Allergies  Objective:  Vital Signs:   Temp:  [97.7 F (36.5 C)-98.2 F (36.8 C)] 98 F (36.7 C) (09/05 1330) Pulse Rate:  [72-86] 73 (09/05 1330) Resp:  [14-21] 16 (09/05 1330) BP: (81-110)/(58-83) 108/62 (09/05 1339) SpO2:  [95 %-99 %] 98 % (09/05 1242) Weight:  [105.2 kg] 105.2 kg (09/05 0447) Last BM Date : 07/17/23  Weight change: Filed Weights   07/17/23 0257 07/18/23 0447  Weight: 105.2 kg 105.2 kg    Intake/Output:   Intake/Output Summary (Last 24 hours) at 07/18/2023 1356 Last data filed at 07/18/2023 1317 Gross per 24 hour  Intake 740 ml  Output --  Net 740 ml      Physical Exam  General:  well appearing.  No respiratory difficulty HEENT: normal Neck: supple. JVD ~6 cm. Carotids 2+ bilat; no bruits. No lymphadenopathy or thyromegaly appreciated. Cor: PMI nondisplaced. Regular rate & rhythm. No rubs, gallops or murmurs. Lungs: clear Abdomen: soft, nontender, nondistended. No hepatosplenomegaly. No bruits or masses. Good bowel sounds. Extremities: no cyanosis, clubbing, rash, edema  Neuro: alert & oriented x 3, cranial nerves grossly intact. moves all 4 extremities w/o difficulty. Affect pleasant.   Telemetry   NSR 70s (Personally reviewed)    EKG    NSR 70s  Labs   Basic Metabolic Panel: Recent Labs  Lab 07/16/23 2342 07/16/23 2343 07/17/23 0424  NA  --  137 137  K  --  3.8 4.3  CL  --  102 101  CO2  --  24 25  GLUCOSE  --  303* 217*  BUN  --  18 16  CREATININE  --  1.27* 1.27*  CALCIUM  --  9.2 9.0  MG 2.1  --  2.0    Liver Function Tests: Recent Labs  Lab 07/17/23 0424  AST 15  ALT 27  ALKPHOS 42  BILITOT 0.4  PROT 6.0*  ALBUMIN 3.6   No results for input(s): "LIPASE", "AMYLASE" in the last 168 hours. No results for input(s): "AMMONIA" in the last 168 hours.  CBC: Recent Labs  Lab 07/16/23 2343 07/17/23 0424  WBC 10.0 9.6  NEUTROABS  --  4.4  HGB 16.0 15.0  HCT 48.0 44.8  MCV 91.4 91.2  PLT 282 237    Cardiac  Enzymes: No results for input(s): "CKTOTAL", "CKMB", "CKMBINDEX", "TROPONINI" in the last 168 hours.  BNP: BNP (last 3 results) Recent Labs    05/29/23 1437  BNP 85.1    ProBNP (last 3 results) No results for input(s): "PROBNP" in the last 8760 hours.   CBG: Recent Labs  Lab 07/17/23 1209 07/17/23 1650 07/17/23 2101 07/18/23 0844 07/18/23 1147  GLUCAP 185* 182* 180* 201* 164*    Coagulation Studies: No results for input(s): "LABPROT", "INR" in the last 72 hours.   Imaging   EP STUDY  Result Date: 07/18/2023 See surgical note for result.  [place] EKG SITE RITE  Result Date: 07/18/2023 If Site Rite image not attached, placement could not be confirmed due to current cardiac rhythm.    Medications:     Current Medications:  [MAR Hold] allopurinol  600 mg Oral Daily   amiodarone  150 mg Intravenous Once   [MAR Hold] apixaban  5 mg Oral BID   [MAR Hold] aspirin EC  81 mg Oral Daily   [MAR Hold] atorvastatin  80 mg Oral Daily   [MAR Hold] carvedilol  25 mg Oral BID WC   [MAR Hold] fenofibrate  160 mg Oral Daily   [MAR Hold] icosapent  Ethyl  2 g Oral BID   [MAR Hold] insulin aspart  0-8 Units Subcutaneous TID WC   [MAR Hold] magnesium oxide  400 mg Oral BID   [MAR Hold] pantoprazole  40 mg Oral Daily   [MAR Hold] sacubitril-valsartan  1 tablet Oral BID   [MAR Hold] spironolactone  25 mg Oral Daily   [MAR Hold] traZODone  100 mg Oral QHS    Infusions:  amiodarone     Followed by   amiodarone        Patient Profile   Mr Cacciatore is 57 year old with a PMH of chronic systolic heart failure due to NICM, DM2, former polysubstance and tobacco abuse.  AHF team to see for hypotension post TEE/DCCV.   Assessment/Plan  Hypotension - post TEE/DCCV, suspect 2/2 sedation - was on 50 of neo and levo at 5 in cath lab. Neo paused in transit to 2H. Pressures stable on arrival to 2H on just levo - Turned neo off. Wean levo as tolerated.  - Possible RHC tomorrow to access  hemodynamics if pressors required overnight - Hold GDMT  A fib RVR - EP following - s/p TEE/DCCV> NSR today - In NSR 70s - Plan to load with amiodarone, bolus plus gtt  Chronic systolic heart failure - Limited echo yesterday EF 35-40%, mild LVH, normal RV - TEE showed EF 25%, LAA no clot, mild MR/TR, small pericardial effusion - NYHA II on admission, looks euvolemic on exam - Will hold GDMT through tonight with pressor requirements, suspect may be able to restart tomorrow - Baseline BPs low 100s - RHC possibly tomorrow, will discuss further with patient and MD.   CAD - Cath 8/21 LAD 100% LCx 20% RCA 10%  - continue ASA, statin (lipitor and vascepa).  - Recent LDL was 15 in 7/24 - denies CP  Hx VT s/p ICD 10/21 - Keep K>4, Mg >2 - Continue amio load  DM2 - A1c 8.5 - SGLT2i when BP improves   Length of Stay: 1  Alen Bleacher, NP  07/18/2023, 1:56 PM  Advanced Heart Failure Team Pager [phone] (M-F; 7a - 5p)  Please contact [facility] for night-coverage after hours (4p -7a ) and weekends on [email or web address]  Agree with above.  57 y/o with CAD and severe ischemic CM EF 25%. Admitted with AF with RVR which degenerated to VT with ICD shock x 3.  Underwent TEE/DC-CV today. EF 25%. Prompt conversion to NSR. Post TEE course c/b hypotension requiring neo and NE support.   Weaning NE currently Denies CP or SOB \  General:  Sitting up in chair  No resp difficulty HEENT: normal Neck: supple. no JVD. Carotids 2+ bilat; no bruits. No lymphadenopathy or thryomegaly appreciated. Cor: Regular rate & rhythm. No rubs, gallops or murmurs. Lungs: clear Abdomen: soft, nontender, nondistended. No hepatosplenomegaly. No bruits or masses. Good bowel sounds. Extremities: no cyanosis, clubbing, rash, edema Neuro: alert & orientedx3, cranial nerves grossly intact. moves all 4 extremities w/o difficulty. Affect pleasant  He appears to have compromised cardiac reserve. Will continue to wean NE  as tolerated. Can consider RHC tomorrow as needed. CContinue amio and AC. D/w EP.   CRITICAL CARE Performed by: Arvilla Meres  Total critical care time: 45 minutes  Critical care time was exclusive of separately billable procedures and treating other patients.  Critical care was necessary to treat or prevent imminent or life-threatening deterioration.  Critical care was time spent personally by me (independent of midlevel providers or residents)  on the following activities: development of treatment plan with patient and/or surrogate as well as nursing, discussions with consultants, evaluation of patient's response to treatment, examination of patient, obtaining history from patient or surrogate, ordering and performing treatments and interventions, ordering and review of laboratory studies, ordering and review of radiographic studies, pulse oximetry and re-evaluation of patient's condition.  Arvilla Meres, MD  5:02 PM

## 2023-07-18 NOTE — Anesthesia Postprocedure Evaluation (Signed)
Anesthesia Post Note  Patient: Jason Floyd  Procedure(s) Performed: CARDIOVERSION TRANSESOPHAGEAL ECHOCARDIOGRAM     Patient location during evaluation: PACU Anesthesia Type: General Level of consciousness: awake Pain management: pain level controlled Vital Signs Assessment: post-procedure vital signs reviewed and stable Respiratory status: spontaneous breathing, nonlabored ventilation and respiratory function stable Cardiovascular status: stable and blood pressure returned to baseline Postop Assessment: no apparent nausea or vomiting Anesthetic complications: no   No notable events documented.  Last Vitals:  Vitals:   07/18/23 1944 07/18/23 2000  BP: 108/69 107/70  Pulse: 71 74  Resp: 18 (!) 27  Temp: 36.5 C   SpO2: 95% 95%    Last Pain:  Vitals:   07/18/23 2000  TempSrc:   PainSc: 0-No pain                 [name]

## 2023-07-18 NOTE — Interval H&P Note (Signed)
History and Physical Interval Note:  07/18/2023 11:58 AM  Jason Floyd  has presented today for surgery, with the diagnosis of afib.  The various methods of treatment have been discussed with the patient and family. After consideration of risks, benefits and other options for treatment, the patient has consented to  Procedure(s): CARDIOVERSION (N/A) TRANSESOPHAGEAL ECHOCARDIOGRAM (N/A) as a surgical intervention.  The patient's history has been reviewed, patient examined, no change in status, stable for surgery.  I have reviewed the patient's chart and labs.  Questions were answered to the patient's satisfaction.     Kobi Mario

## 2023-07-18 NOTE — CV Procedure (Signed)
   TRANSESOPHAGEAL ECHOCARDIOGRAM GUIDED DIRECT CURRENT CARDIOVERSION  NAME:  Jason Floyd   MRN: [id number] DOB:  February 02, 1966   ADMIT DATE: 07/16/2023  INDICATIONS:  Atrial fibrillation  PROCEDURE:   Informed consent was obtained prior to the procedure. The risks, benefits and alternatives for the procedure were discussed and the patient comprehended these risks.  Risks include, but are not limited to, cough, sore throat, vomiting, nausea, somnolence, esophageal and stomach trauma or perforation, bleeding, low blood pressure, aspiration, pneumonia, infection, trauma to the teeth and death.    After a procedural time-out, the oropharynx was anesthetized and the patient was sedated by the anesthesia service. The transesophageal probe was inserted in the esophagus and stomach without difficulty and multiple views were obtained.   FINDINGS:  LEFT VENTRICLE: EF = 25%  RIGHT VENTRICLE: Normal + ICD wire with calcification on it  LEFT ATRIUM:Severely dilated  LEFT ATRIAL APPENDAGE:  No clot  RIGHT ATRIUM: Normal  AORTIC VALVE:  Trileaflet.  No AS/AI  MITRAL VALVE:    Mild MR  TRICUSPID VALVE: Mild TR  PULMONIC VALVE: Trivial PR  INTERATRIAL SEPTUM: No PFO/ASD  PERICARDIUM: Small effusion  DESCENDING AORTA: Mild plaque   CARDIOVERSION:     Indications:  Atrial Fibrillation  Procedure Details:  Once the TEE was complete, the patient had the defibrillator pads placed in the anterior and posterior position. Once an appropriate level of sedation was achieved, the patient received a single biphasic, synchronized 200J shock with prompt conversion to sinus rhythm. No apparent complications.   Arvilla Meres, MD  12:30 PM

## 2023-07-18 NOTE — Transfer of Care (Signed)
Immediate Anesthesia Transfer of Care Note  Patient: Jason Floyd  Procedure(s) Performed: CARDIOVERSION TRANSESOPHAGEAL ECHOCARDIOGRAM  Patient Location: Cath Lab  Anesthesia Type:MAC  Level of Consciousness: drowsy  Airway & Oxygen Therapy: Patient Spontanous Breathing and Patient connected to nasal cannula oxygen  Post-op Assessment: Report given to RN and Post -op Vital signs reviewed and stable  Post vital signs: Reviewed and stable, see post op VS flowsheet  Last Vitals:  Vitals Value Taken Time  BP    Temp    Pulse    Resp    SpO2      Last Pain:  Vitals:   07/18/23 0926  TempSrc: Temporal  PainSc: (P) 0-No pain         Complications: No notable events documented.

## 2023-07-18 NOTE — OR Nursing (Signed)
Patient hypotensive (70s) post procedure requiring BP support with Phenylephrine gtt at 168mcg/min. Contacted Dr. Milas Kocher and Cyndia Skeeters, CRNA for further instructions. New orders given. Patient started on Norepi gtt and Albumin. Anesthesia at bedside. BP back to baseline in 90s. 2H ready. Patient stable to transport. Assymptomatric. A&0x4. Alert and mentating. Will call report and transport patient.

## 2023-07-19 ENCOUNTER — Telehealth: Payer: Self-pay

## 2023-07-19 ENCOUNTER — Other Ambulatory Visit (HOSPITAL_COMMUNITY): Payer: Self-pay

## 2023-07-19 ENCOUNTER — Encounter (HOSPITAL_COMMUNITY): Payer: Self-pay | Admitting: Internal Medicine

## 2023-07-19 DIAGNOSIS — I959 Hypotension, unspecified: Secondary | ICD-10-CM | POA: Diagnosis not present

## 2023-07-19 DIAGNOSIS — I472 Ventricular tachycardia, unspecified: Secondary | ICD-10-CM | POA: Diagnosis not present

## 2023-07-19 DIAGNOSIS — I5022 Chronic systolic (congestive) heart failure: Secondary | ICD-10-CM | POA: Diagnosis not present

## 2023-07-19 LAB — BASIC METABOLIC PANEL
Anion gap: 9 (ref 5–15)
BUN: 19 mg/dL (ref 6–20)
CO2: 24 mmol/L (ref 22–32)
Calcium: 8.8 mg/dL — ABNORMAL LOW (ref 8.9–10.3)
Chloride: 102 mmol/L (ref 98–111)
Creatinine, Ser: 1.31 mg/dL — ABNORMAL HIGH (ref 0.61–1.24)
GFR, Estimated: >60 mL/min (ref >60)
Glucose, Bld: 166 mg/dL — ABNORMAL HIGH (ref 70–99)
Potassium: 3.8 mmol/L (ref 3.5–5.1)
Sodium: 135 mmol/L (ref 135–145)

## 2023-07-19 LAB — MAGNESIUM: Magnesium: 1.7 mg/dL (ref 1.7–2.4)

## 2023-07-19 LAB — GLUCOSE, CAPILLARY
Glucose-Capillary: 155 mg/dL — ABNORMAL HIGH (ref 70–99)
Glucose-Capillary: 224 mg/dL — ABNORMAL HIGH (ref 70–99)
Glucose-Capillary: 181 mg/dL — ABNORMAL HIGH (ref 70–99)

## 2023-07-19 MED ORDER — AMIODARONE HCL 200 MG PO TABS: 200.0000 mg | ORAL_TABLET | Freq: Once | ORAL | Status: DC

## 2023-07-19 MED ORDER — AMIODARONE LOAD VIA INFUSION
150.0000 mg | Freq: Once | INTRAVENOUS | Status: AC
  Administered 2023-07-19: 150 mg via INTRAVENOUS
  Filled 2023-07-19: qty 83.34

## 2023-07-19 MED ORDER — SPIRONOLACTONE 12.5 MG HALF TABLET: 12.5000 mg | ORAL_TABLET | Freq: Every day | ORAL | Status: DC

## 2023-07-19 MED ORDER — AMIODARONE HCL 200 MG PO TABS: 200.0000 mg | ORAL_TABLET | Freq: Two times a day (BID) | ORAL | Status: DC

## 2023-07-19 MED ORDER — AMIODARONE HCL 200 MG PO TABS: 200.0000 mg | ORAL_TABLET | Freq: Every day | ORAL | 5 refills | Status: DC

## 2023-07-19 MED ORDER — AMIODARONE HCL 200 MG PO TABS
400.0000 mg | ORAL_TABLET | Freq: Every day | ORAL | 0 refills | Status: AC
Start: 2023-07-19 — End: 2023-08-02
  Filled 2023-07-19 (×2): qty 28, 14d supply, fill #0

## 2023-07-19 MED ORDER — DAPAGLIFLOZIN PROPANEDIOL 10 MG PO TABS
10.0000 mg | ORAL_TABLET | Freq: Every day | ORAL | Status: DC
  Administered 2023-07-19: 10 mg via ORAL
  Filled 2023-07-19: qty 1

## 2023-07-19 MED ORDER — AMIODARONE HCL 200 MG PO TABS
200.0000 mg | ORAL_TABLET | Freq: Two times a day (BID) | ORAL | Status: DC
  Administered 2023-07-19: 200 mg via ORAL
  Filled 2023-07-19: qty 1

## 2023-07-19 MED ORDER — SPIRONOLACTONE 25 MG PO TABS: 12.5000 mg | ORAL_TABLET | Freq: Every day | ORAL | 5 refills | Status: DC

## 2023-07-19 MED ORDER — MAGNESIUM SULFATE 2 GM/50ML IV SOLN
2.0000 g | Freq: Once | INTRAVENOUS | Status: AC
  Administered 2023-07-19: 2 g via INTRAVENOUS
  Filled 2023-07-19: qty 50

## 2023-07-19 MED ORDER — AMIODARONE LOAD VIA INFUSION
150.0000 mg | Freq: Once | INTRAVENOUS | Status: AC
  Administered 2023-07-19: 150 mg via INTRAVENOUS

## 2023-07-19 MED ORDER — POTASSIUM CHLORIDE CRYS ER 20 MEQ PO TBCR
20.0000 meq | EXTENDED_RELEASE_TABLET | Freq: Once | ORAL | Status: AC
  Administered 2023-07-19: 20 meq via ORAL
  Filled 2023-07-19: qty 1

## 2023-07-19 MED ORDER — AMIODARONE HCL 200 MG PO TABS: 400.0000 mg | ORAL_TABLET | Freq: Every day | ORAL | Status: DC

## 2023-07-19 NOTE — Inpatient Diabetes Management (Signed)
Inpatient Diabetes Program Recommendations  AACE/ADA: New Consensus Statement on Inpatient Glycemic Control (2015)  Target Ranges:  Prepandial:   less than 140 mg/dL      Peak postprandial:   less than 180 mg/dL (1-2 hours)      Critically ill patients:  140 - 180 mg/dL   Lab Results  Component Value Date   GLUCAP 224 (H) 07/19/2023   HGBA1C 8.5 (H) 07/05/2023    Review of Glycemic Control  Latest Reference Range & Units 07/18/23 14:37 07/18/23 16:02 07/18/23 21:10 07/19/23 06:01 07/19/23 10:23  Glucose-Capillary 70 - 99 mg/dL 147 (H) 829 (H) 562 (H) 155 (H) 224 (H)   Diabetes history:  DM Outpatient Diabetes medications:  Ozempic 0.25 mg weekly Farxiga 10 mg daily Current orders for Inpatient glycemic control:  Novolog 0-8 units tid with meals  Inpatient Diabetes Program Recommendations:    Consider d/c of current Novolog correction and change to sensitive Novolog correction (0-9 units) tid with meals.   Thanks,  Beryl Meager, RN, BC-ADM Inpatient Diabetes Coordinator Pager [phone]  (8a-5p)

## 2023-07-19 NOTE — Progress Notes (Addendum)
Advanced Heart Failure Rounding Note  PCP-Cardiologist: None   Subjective:    Now off pressors. GDMT remains on hold. SBPs soft 90s-low 100s.   Maintaining NSR on amio gtt. HR 70s.  K 3.8 Mg 1.7   Sitting up in bed. Feels well. Denies CP/dyspnea. Asking if he can go home today.    Objective:   Weight Range: 104.5 kg Body mass index is 29.57 kg/m.   Vital Signs:   Temp:  [97.1 F (36.2 C)-98.2 F (36.8 C)] 97.8 F (36.6 C) (09/06 0316) Pulse Rate:  [62-87] 79 (09/06 0500) Resp:  [10-30] 15 (09/06 0500) BP: (84-116)/(55-87) 109/77 (09/06 0500) SpO2:  [88 %-98 %] 91 % (09/06 0500) Weight:  [102.2 kg-104.5 kg] 104.5 kg (09/06 0624) Last BM Date : 07/17/23  Weight change: Filed Weights   07/18/23 0447 07/18/23 1415 07/19/23 0624  Weight: 105.2 kg 102.2 kg 104.5 kg    Intake/Output:   Intake/Output Summary (Last 24 hours) at 07/19/2023 0739 Last data filed at 07/19/2023 0600 Gross per 24 hour  Intake 739.73 ml  Output 1500 ml  Net -760.27 ml      Physical Exam    General:  Well appearing. No resp difficulty HEENT: Normal Neck: Supple. JVP not elevated . Carotids 2+ bilat; no bruits. No lymphadenopathy or thyromegaly appreciated. Cor: PMI nondisplaced. Regular rate & rhythm. No rubs, gallops or murmurs. Lungs: Clear Abdomen: Soft, nontender, nondistended. No hepatosplenomegaly. No bruits or masses. Good bowel sounds. Extremities: No cyanosis, clubbing, rash, edema Neuro: Alert & orientedx3, cranial nerves grossly intact. moves all 4 extremities w/o difficulty. Affect pleasant   Telemetry   NSR 70s   EKG    No new EKG to review   Labs    CBC Recent Labs    07/16/23 2343 07/17/23 0424  WBC 10.0 9.6  NEUTROABS  --  4.4  HGB 16.0 15.0  HCT 48.0 44.8  MCV 91.4 91.2  PLT 282 237   Basic Metabolic Panel Recent Labs    02/72/53 0424 07/19/23 0231  NA 137 135  K 4.3 3.8  CL 101 102  CO2 25 24  GLUCOSE 217* 166*  BUN 16 19  CREATININE  1.27* 1.31*  CALCIUM 9.0 8.8*  MG 2.0 1.7   Liver Function Tests Recent Labs    07/17/23 0424  AST 15  ALT 27  ALKPHOS 42  BILITOT 0.4  PROT 6.0*  ALBUMIN 3.6   No results for input(s): "LIPASE", "AMYLASE" in the last 72 hours. Cardiac Enzymes No results for input(s): "CKTOTAL", "CKMB", "CKMBINDEX", "TROPONINI" in the last 72 hours.  BNP: BNP (last 3 results) Recent Labs    05/29/23 1437  BNP 85.1    ProBNP (last 3 results) No results for input(s): "PROBNP" in the last 8760 hours.   D-Dimer No results for input(s): "DDIMER" in the last 72 hours. Hemoglobin A1C No results for input(s): "HGBA1C" in the last 72 hours. Fasting Lipid Panel No results for input(s): "CHOL", "HDL", "LDLCALC", "TRIG", "CHOLHDL", "LDLDIRECT" in the last 72 hours. Thyroid Function Tests Recent Labs    07/17/23 0208  TSH 0.023*    Other results:   Imaging    EP STUDY  Result Date: 07/18/2023 See surgical note for result.  [place] EKG SITE RITE  Result Date: 07/18/2023 If Site Rite image not attached, placement could not be confirmed due to current cardiac rhythm.    Medications:     Scheduled Medications:  allopurinol  600 mg Oral Daily  apixaban  5 mg Oral BID   aspirin EC  81 mg Oral Daily   atorvastatin  80 mg Oral Daily   Chlorhexidine Gluconate Cloth  6 each Topical Daily   fenofibrate  160 mg Oral Daily   icosapent Ethyl  2 g Oral BID   insulin aspart  0-8 Units Subcutaneous TID WC   magnesium oxide  400 mg Oral BID   pantoprazole  40 mg Oral Daily   traZODone  100 mg Oral QHS    Infusions:  sodium chloride Stopped (07/18/23 1625)   amiodarone 30 mg/hr (07/19/23 0600)   norepinephrine (LEVOPHED) Adult infusion Stopped (07/18/23 1428)    PRN Medications: calcium carbonate    Patient Profile   57 y/o with CAD and severe ischemic CM EF 25%. Admitted with AF with RVR which degenerated to VT with ICD shock x 3.   Underwent TEE/DC-CV. EF 25%. Prompt conversion  to NSR. Post TEE course c/b hypotension requiring neo and NE support.  Assessment/Plan    Hypotension - post TEE/DCCV, suspect 2/2 sedation/compromised cardiac reserve  - was on 50 of neo and levo at 5 in cath lab. Now off pressors. SBPs 90s-low 100s.  - GDMT remains on hold - RHC today to access hemodynamics and careful restart of low dose GDMT    A fib RVR - s/p TEE/DCCV> NSR 9/5  - Maintaining NSR today  - transition to PO amio  - Cont Eliquis  - EP following    Chronic systolic heart failure - Limited echo 9/4 EF 35-40%, mild LVH, normal RV - TEE 9/5  showed EF 25%, LAA no clot, mild MR/TR, small pericardial effusion - NYHA II on admission, looks euvolemic on exam - GDMT on hold per above.  - RHC today  - careful restart of low dose GDMT     CAD - Cath 8/21 LAD 100% LCx 20% RCA 10%  - continue ASA, statin (lipitor and vascepa).  - Recent LDL was 15 in 7/24 - denies CP   Hx VT s/p ICD 10/21 - Keep K>4, Mg >2 - Continue amio load   DM2 - A1c 8.5 - restart SGLT2i prior to d/c     Length of Stay: 2  Robbie Lis, PA-C  07/19/2023, 7:39 AM  Advanced Heart Failure Team Pager [phone] (M-F; 7a - 5p)  Please contact [facility] for night-coverage after hours (5p -7a ) and weekends on [email or web address]  Patient seen and examined with the above-signed Advanced Practice Provider and/or Housestaff. I personally reviewed laboratory data, imaging studies and relevant notes. I independently examined the patient and formulated the important aspects of the plan. I have edited the note to reflect any of my changes or salient points. I have personally discussed the plan with the patient and/or family.  Feeling much better.  Off pressors. SBP soft but stable. No further AF on IV amio. Desperately wants to go home today.   General:  Well appearing. No resp difficulty HEENT: normal Neck: supple. no JVD. Carotids 2+ bilat; no bruits. No lymphadenopathy or thryomegaly  appreciated. Cor: PMI nondisplaced. Regular rate & rhythm. No rubs, gallops or murmurs. Lungs: clear Abdomen: soft, nontender, nondistended. No hepatosplenomegaly. No bruits or masses. Good bowel sounds. Extremities: no cyanosis, clubbing, rash, edema Neuro: alert & orientedx3, cranial nerves grossly intact. moves all 4 extremities w/o difficulty. Affect pleasant  Much improved today. Off pressors. In NSR.  Will complete current amio bag and d/c later today on spiro, farxiga and amio.  F/u next week in Clinic and add back Entresto as BP tolerates.   Arvilla Meres, MD  9:12 AM

## 2023-07-19 NOTE — TOC Progression Note (Signed)
Transition of Care [facility]) - Progression Note    Patient Details  Name: Jason Floyd MRN: [id number] Date of Birth: 12/14/1965  [facility]) CM/SW Contact  Nicanor Bake Phone Number: [phone] 07/19/2023, 2:17 PM  Clinical Narrative: HF CSW called and scheduled pts follow up hospital appoint with PCP for Monday, July 28, 2023 at 11:00 AM. TOC will continue following.       Expected Discharge Plan: Home/Self Care Barriers to Discharge: No Barriers Identified  Expected Discharge Plan and Services   Discharge Planning Services: CM Consult   Living arrangements for the past 2 months: Single Family Home Expected Discharge Date: 07/19/23                                     Social Determinants of Health (SDOH) Interventions SDOH Screenings   Food Insecurity: No Food Insecurity (07/17/2023)  Housing: Low Risk  (07/17/2023)  Transportation Needs: No Transportation Needs (07/17/2023)  Utilities: Not At Risk (07/17/2023)  Alcohol Screen: Low Risk  (07/01/2023)  Depression (PHQ2-9): Low Risk  (07/05/2023)  Financial Resource Strain: Low Risk  (07/01/2023)  Physical Activity: Insufficiently Active (07/01/2023)  Social Connections: Moderately Isolated (07/01/2023)  Stress: No Stress Concern Present (07/01/2023)  Tobacco Use: High Risk (07/18/2023)    Readmission Risk Interventions     No data to display

## 2023-07-19 NOTE — Progress Notes (Addendum)
Rounding Note    Patient Name: Jason Floyd Date of Encounter: 07/19/2023  [facility] Cardiologist:   Subjective   Feels fine, no CP, no SOB  Inpatient Medications    Scheduled Meds:  allopurinol  600 mg Oral Daily   apixaban  5 mg Oral BID   aspirin EC  81 mg Oral Daily   atorvastatin  80 mg Oral Daily   Chlorhexidine Gluconate Cloth  6 each Topical Daily   fenofibrate  160 mg Oral Daily   icosapent Ethyl  2 g Oral BID   insulin aspart  0-8 Units Subcutaneous TID WC   magnesium oxide  400 mg Oral BID   pantoprazole  40 mg Oral Daily   traZODone  100 mg Oral QHS   Continuous Infusions:  sodium chloride Stopped (07/18/23 1625)   amiodarone 30 mg/hr (07/19/23 0700)   norepinephrine (LEVOPHED) Adult infusion Stopped (07/19/23 1610)   PRN Meds: calcium carbonate   Vital Signs    Vitals:   07/19/23 0316 07/19/23 0400 07/19/23 0500 07/19/23 0624  BP:  116/66 109/77   Pulse:  67 79   Resp:  13 15   Temp: 97.8 F (36.6 C)     TempSrc: Axillary     SpO2:  94% 91%   Weight:    104.5 kg  Height:        Intake/Output Summary (Last 24 hours) at 07/19/2023 0810 Last data filed at 07/19/2023 0700 Gross per 24 hour  Intake 1253.63 ml  Output 1500 ml  Net -246.37 ml      07/19/2023    6:24 AM 07/18/2023    2:15 PM 07/18/2023    4:47 AM  Last 3 Weights  Weight (lbs) 230 lb 4.8 oz 225 lb 5 oz 231 lb 14.8 oz  Weight (kg) 104.463 kg 102.2 kg 105.2 kg      Telemetry    SR 60's-80's, infrequent PVCs (2 morphologies) - Personally Reviewed  ECG    SR 73bpm, 1st degree AVBlock , LAD, QRS 110, poor ant R progression - Personally Reviewed  Physical Exam   GEN: No acute distress.   Neck: No JVD Cardiac: RRR no murmurs, rubs, or gallops.  Respiratory: CTA b/l. GI: Soft, nontender, non-distended  MS: No edema Neuro:  Nonfocal  Psych: Normal affect   Labs    High Sensitivity Troponin:   Recent Labs  Lab 07/16/23 2343 07/17/23 0208  TROPONINIHS  87* 82*     Chemistry Recent Labs  Lab 07/16/23 2342 07/16/23 2343 07/17/23 0424 07/19/23 0231  NA  --  137 137 135  K  --  3.8 4.3 3.8  CL  --  102 101 102  CO2  --  24 25 24   GLUCOSE  --  303* 217* 166*  BUN  --  18 16 19   CREATININE  --  1.27* 1.27* 1.31*  CALCIUM  --  9.2 9.0 8.8*  MG 2.1  --  2.0 1.7  PROT  --   --  6.0*  --   ALBUMIN  --   --  3.6  --   AST  --   --  15  --   ALT  --   --  27  --   ALKPHOS  --   --  42  --   BILITOT  --   --  0.4  --   GFRNONAA  --  >60 >60 >60  ANIONGAP  --  11 11 9  Lipids No results for input(s): "CHOL", "TRIG", "HDL", "LABVLDL", "LDLCALC", "CHOLHDL" in the last 168 hours.  Hematology Recent Labs  Lab 07/16/23 2343 07/17/23 0424  WBC 10.0 9.6  RBC 5.25 4.91  HGB 16.0 15.0  HCT 48.0 44.8  MCV 91.4 91.2  MCH 30.5 30.5  MCHC 33.3 33.5  RDW 12.6 12.8  PLT 282 237   Thyroid  Recent Labs  Lab 07/17/23 0208  TSH 0.023*  FREET4 2.53*    BNPNo results for input(s): "BNP", "PROBNP" in the last 168 hours.  DDimer No results for input(s): "DDIMER" in the last 168 hours.   Radiology     DG Chest 2 View Result Date: 07/16/2023 CLINICAL DATA:  Chest tightness.  Defibrillator has gone off. EXAM: CHEST - 2 VIEW COMPARISON:  09/25/2021 FINDINGS: Dual lead left-sided pacemaker with lead tips projecting over the right atrium and ventricle. Heart size upper normal. Normal mediastinal contours. No pulmonary edema, pleural effusion, or pneumothorax. No focal airspace disease. No acute osseous abnormalities IMPRESSION: 1. No acute chest findings. 2. Borderline cardiomegaly. Electronically Signed   By: Narda Rutherford M.D.   On: 07/16/2023 23:56    Cardiac Studies    07/18/23: TEE FINDINGS: LEFT VENTRICLE: EF = 25% RIGHT VENTRICLE: Normal + ICD wire with calcification on it LEFT ATRIUM:Severely dilated LEFT ATRIAL APPENDAGE:  No clot RIGHT ATRIUM: Normal AORTIC VALVE:  Trileaflet.  No AS/AI MITRAL VALVE:    Mild MR TRICUSPID  VALVE: Mild TR PULMONIC VALVE: Trivial PR INTERATRIAL SEPTUM: No PFO/ASD PERICARDIUM: Small effusion DESCENDING AORTA: Mild plaque   07/17/23: limited echo 1. Image quality makes assessment of wall motion challenging. Left  ventricular ejection fraction, by estimation, is 35 to 40%. The left  ventricle has moderately decreased function. There is mild left  ventricular hypertrophy.   2. Right ventricular systolic function is normal. The right ventricular  size is normal.   Conclusion(s)/Recommendation(s): LVEF appears unchanged from prior study.      05/29/23: TTE  1. Left ventricular ejection fraction, by estimation, is 35 to 40%. The  left ventricle has moderately decreased function. The left ventricle  demonstrates global hypokinesis. The left ventricular internal cavity size  was moderately dilated. There is mild   left ventricular hypertrophy. Left ventricular diastolic parameters are  consistent with Grade I diastolic dysfunction (impaired relaxation).   2. Device leads in RA/RV. Right ventricular systolic function is normal.  The right ventricular size is normal.   3. Left atrial size was moderately dilated.   4. The mitral valve is abnormal. Trivial mitral valve regurgitation. No  evidence of mitral stenosis.   5. The aortic valve is tricuspid. Aortic valve regurgitation is not  visualized. No aortic stenosis is present.   6. The inferior vena cava is normal in size with greater than 50%  respiratory variability, suggesting right atrial pressure of 3 mmHg.    07/01/20: c/MRI for viability  - LVEF 26% - LGE suggestive of a very large, wrap-around LAD infarction with extensive no-reflow affecting the subendocardium.   Patient Profile     57 y.o. male with a hx of DM, chronic back pain, former smoker/poly substance abuse, NICM that had recovered >> CAD w/ICM, chronic CHF, HTN, HLD, PE, DM, hx of ?TIA, VTs, AFib admitted w/ICD shocks/VT   Device information BSCi dual  chamber ICD implanted 08/22/2020 Secondary prevention Hx of symptomatic VT (NSVTs towards 30 seconds)   AAD hx Amiodarone started 2021 >> stopped Jan 2024 (seems 2/2 young age, ? Hypothyroidism)  Feb 2024 > appropriate tx for VT (suspect 2/2 binge drinking that weekend), AAD not started, suspect "triggered event"   This admission Amiodarone resumed   Assessment & Plan    ICD shocks/therapies VT Appropriate therapy, will try to avoid amiodarone > stop Started mexiletine   No driving 6 mo, he is aware of [place] law     Paroxysmal Afib, CHA2DS2Vasc is 8, on eliquis He was in SR  to AFib by EGM 07/14/23   Device programmed DDD 50 (with a mode switch rate of 40) to get a better assessment of his AF burden going forward. D/w Dr. Thomasene Lot. Bensimhon > agreed with amiodarone plan  Will transition to PO today when current bag finishes > 400mg  daily Await plan for +/- RHC/LHC (as discussed below)   CAD No ischemic symptoms Flat HS Trop  D/w HF APP, no anginal sounding symptoms In d/w Dr. [name] perhaps never really had any overwhelming anginal symptoms at his original presentation back in 2021 Given recurrent VT Perhaps LHC may be reasonable  VT was monomorphic, very fast       ICM LVEF down by his TEE 25% Home meds held after TEE/hypotensive event AHF team is on board  Off pressors fairly quickly after his TEE His BP remains marginal 90's mostly -100s without hs GDMT Was some discussion of poss RHC with his LVEF down 20's and concerns of perhaps a change in his HF trajectory  Will defer to HF team ?LHC as well     6. Hyperthyroid Off levothyroxine since 05/29/23 Unclear if amiodarone played a role in his developing thyroid issues   7. DM  SSI while here   For questions or updates, please contact [facility] Please consult [email or web address] for contact info under        Signed, Sheilah Pigeon, PA-C  07/19/2023, 8:10 AM

## 2023-07-19 NOTE — Telephone Encounter (Signed)
Pt seen in hospital by [facility] representative and following changes made to programming:  Changed mode from VVI 40 to DDD 50.  Long AV delays (intrinsic PR looks to be around or so).  Reverse mode switch turned on.  Also made the fallback rate on a mode switch to 40 (instead of the normal 70).

## 2023-07-19 NOTE — Discharge Summary (Signed)
DISCHARGE SUMMARY    Patient ID: Jason Floyd,  MRN: [id number], DOB/AGE: [birth year] 57 y.o.  Admit date: 07/16/2023 Discharge date: 07/19/2023  Primary Care Physician: Sheliah Hatch, MD  Primary Cardiologist: Dr. [name] Electrophysiologist: Dr. Graciela Husbands  Primary Discharge Diagnosis:  ICD therapies VT Paroxysmal AFib  Secondary Discharge Diagnosis:  CAD Mixed CM (ischemic/NICM) Chronic CHF compensated DM PE historically TIA Unclear/historically  No Known Allergies   Procedures This Admission:   07/18/23 TEE/DCCV w/Dr. [name]   Brief HPI: Jason Floyd is a 57 y.o. male with PMHx that includes above, sought attention with recurrent ICD shocks.    Hospital Course:  The patient was called the day prior made aware of ICD shock that had occurred that he was unaware of that would have occurred during sleep, rerported medication compliance, no symptoms and planned to see Dr. Graciela Husbands in the office the following day. That same day, he did then develop a couple episodes of waves of weakness ahead of the being shocked (x2) events,  no associated syncope, felt both.  This was very reminiscent of hiw he felt back before his ICD with his VT He denies any CP, SOB, DOE, reported being very active physically and thinks perhaps of late overdoing it with little sleep as well. ICD interrogation confirmed pt reports AFib CVR > tachy 250bpm > ATP > HV tx 41J > AFib CVR AFib CVR > tachy 253 > ATP > HV tx > AFib CVR AFib CVR > tachy > ATP > > AFib CVR AFib CVR > tachy > ATP > AFib CVR AFib CVR > tachy > ATP > AFib CVR  He remains in rate controlled AFib on arrival, was admitted.  TSH was low (c/w recent labs) otherwise labs largely unremarkable HS Trops mildly elevated and flat   He had missed some Eliquis doses, and amiodarone that had been started held, and started on mexiletine.  Limited TTE LVEF 35-40% He underwent TEE/DCCV 07/18/23 > SR.  LVEF down to 25%, LA  severely dilated Post procedure he had persistent hypotension requiring pressor support and transferred to ICU. Pressors able to be weaned off quickly Amiodarone load/gtt started in d/w Drs Graciela Husbands and Bensimhone, in agreement that the amiodarone necessary and thyroid management to proceed out patieint with his PMR preferably referral to endocrinology. This was discussed with the patient.  There was some discussion of at least RHC though ultimately decided not to He was seen buy AHF team, Dr. [name] day of discharge.  BP stable though on  the lower side. OK  to discharge from their perspective with completion of the amiodarone bag currently running.  STOP Coreg and Entresto  ON Cleda Daub 12.5mg  daily, farxiga HF team will see next week  The patient feels well, denies any CP or SOB, he was examined by Dr. Jimmey Ralph and considered stable for discharge to home.  Dr. Graciela Husbands has as well seen the patient this afternoon  Amiodarone dose 400mg  daily for 2 weeks >> then 20mg  daily   Pt was informed/is aware no driving 6 months  Physical Exam: Vitals:   07/19/23 1130 07/19/23 1145 07/19/23 1200 07/19/23 1300  BP:   122/81 119/77  Pulse: 69 67 70   Resp:      Temp:   98.1 F (36.7 C)   TempSrc:   Oral   SpO2: 97% 97% 95%   Weight:      Height:         Labs:  Lab Results  Component Value Date   WBC 9.6 07/17/2023   HGB 15.0 07/17/2023   HCT 44.8 07/17/2023   MCV 91.2 07/17/2023   PLT 237 07/17/2023    Recent Labs  Lab 07/17/23 0424 07/19/23 0231  NA 137 135  K 4.3 3.8  CL 101 102  CO2 25 24  BUN 16 19  CREATININE 1.27* 1.31*  CALCIUM 9.0 8.8*  PROT 6.0*  --   BILITOT 0.4  --   ALKPHOS 42  --   ALT 27  --   AST 15  --   GLUCOSE 217* 166*    Discharge Medications:  Allergies as of 07/19/2023   No Known Allergies      Medication List     STOP taking these medications    carvedilol 25 MG tablet Commonly known as: COREG   Entresto 49-51 MG Generic drug:  sacubitril-valsartan       TAKE these medications    allopurinol 300 MG tablet Commonly known as: ZYLOPRIM TAKE 2 TABLETS (600 MG TOTAL) BY MOUTH DAILY.   amiodarone 200 MG tablet Commonly known as: Pacerone Take 2 tablets (400 mg total) by mouth daily for 14 days.   amiodarone 200 MG tablet Commonly known as: Pacerone Take 1 tablet (200 mg total) by mouth daily. Start taking on: August 03, 2023 Notes to patient: Please note, start this dose on 08/04/23 (not 08/03/23)   aspirin EC 81 MG tablet Take 81 mg by mouth daily. Swallow whole.   atorvastatin 80 MG tablet Commonly known as: LIPITOR Take 80 mg by mouth daily.   calcium carbonate 500 MG chewable tablet Commonly known as: TUMS - dosed in mg elemental calcium Chew 2-3 tablets by mouth daily as needed for indigestion or heartburn.   cetirizine 10 MG tablet Commonly known as: ZYRTEC Take 10 mg by mouth daily.   CLEAR EYES OP Place 1 drop into both eyes daily as needed (sore eyes).   cyclobenzaprine 10 MG tablet Commonly known as: FLEXERIL TAKE 1 TABLET BY MOUTH 3 TIMES DAILY AS NEEDED FOR MUSCLE SPASMS. What changed: when to take this   Eliquis 5 MG Tabs tablet Generic drug: apixaban TAKE 1 TABLET BY MOUTH 2 TIMES A DAY   Farxiga 10 MG Tabs tablet Generic drug: dapagliflozin propanediol TAKE 1 TABLET BY MOUTH DAILY BEFORE BREAKFAST.   fenofibrate 160 MG tablet TAKE 1 TABLET BY MOUTH DAILY.   Magnesium Oxide 400 MG Caps Take 1 capsule (400 mg total) by mouth in the morning and at bedtime.   methocarbamol 500 MG tablet Commonly known as: ROBAXIN TAKE 1 TABLET BY MOUTH EVERY 6 HOURS AS NEEDED FOR MUSCLE SPASMS.   Ozempic (0.25 or 0.5 MG/DOSE) 2 MG/3ML Sopn Generic drug: Semaglutide(0.25 or 0.5MG /DOS) Inject 0.25 mg into the skin once a week.   pantoprazole 40 MG tablet Commonly known as: PROTONIX TAKE 1 TABLET BY MOUTH DAILY.   Repatha SureClick 140 MG/ML Soaj Generic drug: Evolocumab Inject 140  mg into the skin every 14 (fourteen) days.   sodium chloride 0.65 % Soln nasal spray Commonly known as: OCEAN Place 1 spray into both nostrils as needed (dryness).   spironolactone 25 MG tablet Commonly known as: ALDACTONE Take 0.5 tablets (12.5 mg total) by mouth at bedtime. What changed:  how much to take when to take this   traZODone 100 MG tablet Commonly known as: DESYREL TAKE 1 TABLET (100 MG TOTAL) BY MOUTH AT BEDTIME.   Vascepa 1 g capsule Generic drug:  icosapent Ethyl TAKE 2 CAPSULES BY MOUTH 2 TIMES DAILY.         Disposition: Home Discharge Instructions     Diet - low sodium heart healthy   Complete by: As directed    Increase activity slowly   Complete by: As directed        Follow-up Information     Bensimhon, Bevelyn Buckles, MD Follow up.   Specialty: Cardiology Why: Thursday 07/25/23 at 9:40 AM   Hospital Follow-up at the [facility], [facility] Doran Stabler information: [address] [place] [address] [phone]         Sheliah Hatch, MD Follow up.   Specialty: Family Medicine Why: please schedule hospital follow up appointment ASAP to follow up on thyroid management and discuss endocrinology (thyroid specialist) referral Contact information: [address] [place] [address] [phone]         Nobie Putnam, MD Follow up.   Specialty: Cardiology Why: Dr. Lavone Neri scheduler will call you to make a follow up visit to discuss management strategies for your atrial fibrillation, discuss ablation, as well as follow up for your VT Contact information: [address] [place] [address] [phone]                 Duration of Discharge Encounter: Greater than 30 minutes including physician time.  Norma Fredrickson, PA-C 07/19/2023 1:09 PM

## 2023-07-19 NOTE — Discharge Instructions (Signed)
No driving 6 months

## 2023-07-19 NOTE — Plan of Care (Signed)
Pt discharged

## 2023-07-19 NOTE — TOC Initial Note (Signed)
Transition of Care [facility]) - Initial/Assessment Note    Patient Details  Name: Jason Floyd MRN: [id number] Date of Birth: 10/29/1966  [facility] CM/SW Contact:    Elliot Cousin, RN Phone Number: [phone] 07/19/2023, 11:32 AM  Clinical Narrative:   CM spoke to pt and states he will schedule hospital follow up with his PCP. States he just saw PCP a week ago. Pt states he does daily weights and adheres to a low sodium and heart healthy diet. Wife will provide transportation home.               Expected Discharge Plan: Home/Self Care Barriers to Discharge: No Barriers Identified   Patient Goals and CMS Choice Patient states their goals for this hospitalization and ongoing recovery are:: return home          Expected Discharge Plan and Services   Discharge Planning Services: CM Consult   Living arrangements for the past 2 months: Single Family Home                  Prior Living Arrangements/Services Living arrangements for the past 2 months: Single Family Home Lives with:: Pets, Spouse Patient language and need for interpreter reviewed:: Yes Do you feel safe going back to the place where you live?: Yes      Need for Family Participation in Patient Care: No (Comment) Care giver support system in place?: No (comment)   Criminal Activity/Legal Involvement Pertinent to Current Situation/Hospitalization: No - Comment as needed  Activities of Daily Living Home Assistive Devices/Equipment: None ADL Screening (condition at time of admission) Patient's cognitive ability adequate to safely complete daily activities?: Yes Is the patient deaf or have difficulty hearing?: No Does the patient have difficulty seeing, even when wearing glasses/contacts?: No Does the patient have difficulty concentrating, remembering, or making decisions?: No Patient able to express need for assistance with ADLs?: Yes Does the patient have difficulty dressing or bathing?:  No Independently performs ADLs?: Yes (appropriate for developmental age) Does the patient have difficulty walking or climbing stairs?: No Weakness of Legs: None Weakness of Arms/Hands: None  Permission Sought/Granted Permission sought to share information with : Case Manager, Family Supports, PCP Permission granted to share information with : Yes, Verbal Permission Granted  Share Information with NAME: Revon Sutter     Permission granted to share info w Relationship: wife  Permission granted to share info w Contact Information: [phone]  Emotional Assessment Appearance:: Appears stated age Attitude/Demeanor/Rapport: Engaged Affect (typically observed): Accepting Orientation: : Oriented to Self, Oriented to Place, Oriented to  Time, Oriented to Situation Alcohol / Substance Use: Not Applicable Psych Involvement: No (comment)  Admission diagnosis:  Ventricular tachycardia (HCC) [I47.20] AICD discharge [Z45.02] Patient Active Problem List   Diagnosis Date Noted   Ventricular tachycardia (HCC) 07/17/2023   Hypothyroid 07/25/2022   Primary osteoarthritis of right knee 10/03/2021   Primary localized osteoarthritis of right knee 10/03/2021   Ischemic cardiomyopathy 03/09/2021   Persistent atrial fibrillation (HCC) 03/09/2021   ICD (implantable cardioverter-defibrillator) in place - BS 12/05/2020   Coronary artery disease involving native coronary artery of native heart without angina pectoris 09/21/2020   Chronic combined systolic and diastolic congestive heart failure (HCC) 09/21/2020   V-tach (HCC) 09/21/2020   Nasal septal perforation 08/10/2020   TIA (transient ischemic attack) 06/17/2020   GERD (gastroesophageal reflux disease) 08/05/2019   HTN (hypertension) 04/16/2018   Insomnia 01/22/2018   Controlled diabetes mellitus type 2 with complications (HCC) 07/07/2015  Thoracic back pain 04/19/2014   Cervical radiculopathy 08/21/2012   Routine general medical examination  at a health care facility 04/17/2012   Hyperlipidemia 04/17/2012   OBESITY 12/07/2009   FASCIITIS, PLANTAR 03/29/2009   PULMONARY EMBOLISM, HX OF 11/18/2008   NEPHROLITHIASIS, HX OF 11/18/2008   DRUG ABUSE, HX OF 11/18/2008   GOUT 03/22/2008   NUMMULAR ECZEMA 03/22/2008   PCP:  Sheliah Hatch, MD Pharmacy:   [facility] - [place], [place] - [address] [place] [address] Phone: [phone] Fax: [phone]     Social Determinants of Health (SDOH) Social History: SDOH Screenings   Food Insecurity: No Food Insecurity (07/17/2023)  Housing: Low Risk  (07/17/2023)  Transportation Needs: No Transportation Needs (07/17/2023)  Utilities: Not At Risk (07/17/2023)  Alcohol Screen: Low Risk  (07/01/2023)  Depression (PHQ2-9): Low Risk  (07/05/2023)  Financial Resource Strain: Low Risk  (07/01/2023)  Physical Activity: Insufficiently Active (07/01/2023)  Social Connections: Moderately Isolated (07/01/2023)  Stress: No Stress Concern Present (07/01/2023)  Tobacco Use: High Risk (07/18/2023)   SDOH Interventions:     Readmission Risk Interventions     No data to display

## 2023-07-22 ENCOUNTER — Encounter (HOSPITAL_COMMUNITY): Payer: Self-pay | Admitting: Internal Medicine

## 2023-07-22 ENCOUNTER — Telehealth: Payer: Self-pay

## 2023-07-22 ENCOUNTER — Other Ambulatory Visit (HOSPITAL_COMMUNITY): Payer: Self-pay

## 2023-07-22 ENCOUNTER — Telehealth: Payer: Self-pay | Admitting: Pharmacist

## 2023-07-22 MED ORDER — OZEMPIC (0.25 OR 0.5 MG/DOSE) 2 MG/1.5ML ~~LOC~~ SOPN: 0.5000 mg | PEN_INJECTOR | SUBCUTANEOUS | 0 refills | Status: DC

## 2023-07-22 NOTE — Telephone Encounter (Signed)
Opened in error

## 2023-07-22 NOTE — Telephone Encounter (Signed)
Pharmacy Patient Advocate Encounter   Received notification from CoverMyMeds that prior authorization for [facility] is required/requested.   Insurance verification completed.   The patient is insured through [facility] .   Per test claim: PA required; PA submitted to [facility] via CoverMyMeds Key/confirmation #/EOC [id number] Status is pending

## 2023-07-22 NOTE — Telephone Encounter (Signed)
Spoke to patient, reports his is tolerating 0.25 mg Ozempic well without side effects and ready to move on to 0.5 mg dose.  Will f/u in 5 wks for dose titration

## 2023-07-22 NOTE — Transitions of Care (Post Inpatient/ED Visit) (Signed)
07/22/2023  Name: Jason Floyd MRN: [id number] DOB: [birth year]  Today's TOC FU Call Status: Today's TOC FU Call Status:: Successful TOC FU Call Completed TOC FU Call Complete Date: 07/22/23 Patient's Name and Date of Birth confirmed.  Transition Care Management Follow-up Telephone Call Date of Discharge: 07/19/23 Discharge Facility: [facility]) Type of Discharge: Inpatient Admission Primary Inpatient Discharge Diagnosis:: Ventricular tachycardia How have you been since you were released from the hospital?: Better Any questions or concerns?: No  Items Reviewed: Did you receive and understand the discharge instructions provided?: Yes Medications obtained,verified, and reconciled?: Yes (Medications Reviewed) Any new allergies since your discharge?: No Dietary orders reviewed?: NA Do you have support at home?: Yes People in Home: significant other  Medications Reviewed Today: Medications Reviewed Today     Reviewed by Leigh Aurora, CMA (Certified Medical Assistant) on 07/22/23 at 1456  Med List Status: <None>   Medication Order Taking? Sig Documenting Provider Last Dose Status Informant  allopurinol (ZYLOPRIM) 300 MG tablet [id number] No TAKE 2 TABLETS (600 MG TOTAL) BY MOUTH DAILY. Sheliah Hatch, MD 07/16/2023 am Active Self  amiodarone (PACERONE) 200 MG tablet [id number]  Take 2 tablets (400 mg total) by mouth daily for 14 days. Sheilah Pigeon, PA-C  Active   amiodarone (PACERONE) 200 MG tablet [id number]  Take 1 tablet (200 mg total) by mouth daily. Sheilah Pigeon, PA-C  Active   aspirin EC 81 MG tablet [id number] No Take 81 mg by mouth daily. Swallow whole. [provider] 07/16/2023 am Active Self  atorvastatin (LIPITOR) 80 MG tablet [id number] No Take 80 mg by mouth daily. [provider] 07/16/2023 Active   calcium carbonate (TUMS - DOSED IN MG ELEMENTAL CALCIUM) 500 MG chewable tablet [id number] No Chew 2-3 tablets by mouth daily as  needed for indigestion or heartburn. [provider] Past Week Active Self  cetirizine (ZYRTEC) 10 MG tablet [id number] No Take 10 mg by mouth daily. [provider] 07/16/2023 am Active Self  cyclobenzaprine (FLEXERIL) 10 MG tablet [id number] No TAKE 1 TABLET BY MOUTH 3 TIMES DAILY AS NEEDED FOR MUSCLE SPASMS.  Patient taking differently: Take 10 mg by mouth at bedtime.   Sheliah Hatch, MD 07/15/2023 pm Active Self  ELIQUIS 5 MG TABS tablet [id number] No TAKE 1 TABLET BY MOUTH 2 TIMES A DAY Bensimhon, Bevelyn Buckles, MD 07/16/2023 1000 Active Self  Evolocumab (REPATHA SURECLICK) 140 MG/ML SOAJ [id number] No Inject 140 mg into the skin every 14 (fourteen) days. Dolores Patty, MD 07/03/2023 Active Self  FARXIGA 10 MG TABS tablet [id number] No TAKE 1 TABLET BY MOUTH DAILY BEFORE BREAKFAST. Sheliah Hatch, MD 07/16/2023 am Active Self  fenofibrate 160 MG tablet [id number] No TAKE 1 TABLET BY MOUTH DAILY. Sheliah Hatch, MD 07/16/2023 am Active Self  Magnesium Oxide 400 MG CAPS [id number] No Take 1 capsule (400 mg total) by mouth in the morning and at bedtime. Graciella Freer, PA-C 07/16/2023 am Active Self  methocarbamol (ROBAXIN) 500 MG tablet [id number] No TAKE 1 TABLET BY MOUTH EVERY 6 HOURS AS NEEDED FOR MUSCLE SPASMS. Sheliah Hatch, MD 07/16/2023 am Active Self  Naphazoline HCl (CLEAR EYES OP) [id number] No Place 1 drop into both eyes daily as needed (sore eyes). [provider] Past Week Active Self  pantoprazole (PROTONIX) 40 MG tablet [id number] No TAKE 1 TABLET BY MOUTH DAILY. Sheliah Hatch, MD 07/16/2023 am Active Self  Semaglutide,0.25 or 0.5MG /DOS, (OZEMPIC, 0.25 OR 0.5 MG/DOSE,) 2 MG/1.5ML  SOPN [id number]  Inject 0.5 mg into the skin once a week. Bensimhon, Bevelyn Buckles, MD  Active   sodium chloride (OCEAN) 0.65 % SOLN nasal spray [id number] No Place 1 spray into both nostrils as needed (dryness). [provider] Past Week Active Self  spironolactone  (ALDACTONE) 25 MG tablet [id number]  Take 0.5 tablets (12.5 mg total) by mouth at bedtime. Sheilah Pigeon, PA-C  Active   traZODone (DESYREL) 100 MG tablet [id number] No TAKE 1 TABLET (100 MG TOTAL) BY MOUTH AT BEDTIME. Sheliah Hatch, MD 07/15/2023 pm Active Self  VASCEPA 1 g capsule [id number] No TAKE 2 CAPSULES BY MOUTH 2 TIMES DAILY. Bensimhon, Bevelyn Buckles, MD 07/16/2023 am Active Self            Home Care and Equipment/Supplies: Were Home Health Services Ordered?: NA Any new equipment or medical supplies ordered?: NA  Functional Questionnaire: Do you need assistance with bathing/showering or dressing?: No Do you need assistance with meal preparation?: No Do you need assistance with eating?: No Do you have difficulty maintaining continence: No Do you need assistance with getting out of bed/getting out of a chair/moving?: No Do you have difficulty managing or taking your medications?: No  Follow up appointments reviewed: PCP Follow-up appointment confirmed?: Yes Date of PCP follow-up appointment?: 07/29/23 Follow-up Provider: Dr. Beverely Low Specialist [facility] Follow-up appointment confirmed?: Yes Date of Specialist follow-up appointment?: 07/25/23 Follow-Up Specialty Provider:: Dr. Arvilla Meres- Cardiology Do you need transportation to your follow-up appointment?: No Do you understand care options if your condition(s) worsen?: Yes-patient verbalized understanding    SIGNATURE Agnes Lawrence, CMA (AAMA)  [facility]- [facility] Program [phone]

## 2023-07-23 ENCOUNTER — Other Ambulatory Visit (HOSPITAL_COMMUNITY): Payer: Self-pay

## 2023-07-23 NOTE — Telephone Encounter (Signed)
Pharmacy Patient Advocate Encounter  Received notification from [facility] that Prior Authorization for [facility] has been APPROVED from 07/22/23 to 07/21/26. Ran test claim, Copay is $24.99. This test claim was processed through [facility] copay amounts may vary at other pharmacies due to pharmacy/plan contracts, or as the patient moves through the different stages of their insurance plan.

## 2023-07-25 ENCOUNTER — Encounter (HOSPITAL_COMMUNITY): Payer: Self-pay | Admitting: Internal Medicine

## 2023-07-25 ENCOUNTER — Other Ambulatory Visit (HOSPITAL_COMMUNITY): Payer: Self-pay

## 2023-07-25 ENCOUNTER — Ambulatory Visit (HOSPITAL_COMMUNITY)
Admit: 2023-07-25 | Discharge: 2023-07-25 | Disposition: A | Discharge status: Home / Self Care | Payer: BC Managed Care – PPO | Attending: Internal Medicine | Admitting: Internal Medicine

## 2023-07-25 VITALS — BP 122/80 | HR 69 | Wt 237.0 lb

## 2023-07-25 DIAGNOSIS — I110 Hypertensive heart disease with heart failure: Secondary | ICD-10-CM | POA: Diagnosis present

## 2023-07-25 DIAGNOSIS — I5022 Chronic systolic (congestive) heart failure: Secondary | ICD-10-CM | POA: Diagnosis not present

## 2023-07-25 DIAGNOSIS — Z720 Tobacco use: Secondary | ICD-10-CM

## 2023-07-25 DIAGNOSIS — Z79899 Other long term (current) drug therapy: Secondary | ICD-10-CM | POA: Insufficient documentation

## 2023-07-25 DIAGNOSIS — F1910 Other psychoactive substance abuse, uncomplicated: Secondary | ICD-10-CM | POA: Insufficient documentation

## 2023-07-25 DIAGNOSIS — I5042 Chronic combined systolic (congestive) and diastolic (congestive) heart failure: Secondary | ICD-10-CM | POA: Diagnosis not present

## 2023-07-25 DIAGNOSIS — I959 Hypotension, unspecified: Secondary | ICD-10-CM | POA: Diagnosis not present

## 2023-07-25 DIAGNOSIS — Z7982 Long term (current) use of aspirin: Secondary | ICD-10-CM | POA: Insufficient documentation

## 2023-07-25 DIAGNOSIS — E669 Obesity, unspecified: Secondary | ICD-10-CM | POA: Insufficient documentation

## 2023-07-25 DIAGNOSIS — I4819 Other persistent atrial fibrillation: Secondary | ICD-10-CM | POA: Insufficient documentation

## 2023-07-25 DIAGNOSIS — I472 Ventricular tachycardia, unspecified: Secondary | ICD-10-CM | POA: Diagnosis not present

## 2023-07-25 DIAGNOSIS — Z95810 Presence of automatic (implantable) cardiac defibrillator: Secondary | ICD-10-CM | POA: Diagnosis not present

## 2023-07-25 DIAGNOSIS — E119 Type 2 diabetes mellitus without complications: Secondary | ICD-10-CM | POA: Insufficient documentation

## 2023-07-25 DIAGNOSIS — Z6830 Body mass index (BMI) 30.0-30.9, adult: Secondary | ICD-10-CM | POA: Diagnosis not present

## 2023-07-25 DIAGNOSIS — E039 Hypothyroidism, unspecified: Secondary | ICD-10-CM | POA: Diagnosis not present

## 2023-07-25 DIAGNOSIS — I2510 Atherosclerotic heart disease of native coronary artery without angina pectoris: Secondary | ICD-10-CM | POA: Diagnosis not present

## 2023-07-25 DIAGNOSIS — I480 Paroxysmal atrial fibrillation: Secondary | ICD-10-CM

## 2023-07-25 LAB — BASIC METABOLIC PANEL
Anion gap: 12 (ref 5–15)
BUN: 17 mg/dL (ref 6–20)
CO2: 25 mmol/L (ref 22–32)
Calcium: 9.7 mg/dL (ref 8.9–10.3)
Chloride: 102 mmol/L (ref 98–111)
Creatinine, Ser: 1.25 mg/dL — ABNORMAL HIGH (ref 0.61–1.24)
GFR, Estimated: >60 mL/min (ref >60)
Glucose, Bld: 181 mg/dL — ABNORMAL HIGH (ref 70–99)
Potassium: 4.2 mmol/L (ref 3.5–5.1)
Sodium: 139 mmol/L (ref 135–145)

## 2023-07-25 LAB — BRAIN NATRIURETIC PEPTIDE: B Natriuretic Peptide: 397.6 pg/mL — ABNORMAL HIGH (ref 0.0–100.0)

## 2023-07-25 MED ORDER — ENTRESTO 49-51 MG PO TABS
1.0000 | ORAL_TABLET | Freq: Two times a day (BID) | ORAL | 3 refills | Status: DC
Start: 2023-07-25 — End: 2024-04-10

## 2023-07-25 MED ORDER — SPIRONOLACTONE 25 MG PO TABS: 25.0000 mg | ORAL_TABLET | Freq: Every day | ORAL | 6 refills | Status: DC

## 2023-07-25 NOTE — Patient Instructions (Signed)
Medication Changes:  Start Entresto 49/51 mg Twice daily   Increase Spironolactone to 25 mg (1 tab) Daily  Take Furosemide 20 mg and Potassium 20 meq AS NEEDED ONLY  Lab Work:  Labs done today, your results will be available in MyChart, we will contact you for abnormal readings.  Testing/Procedures:  Your physician has requested that you have an echocardiogram. Echocardiography is a painless test that uses sound waves to create images of your heart. It provides your doctor with information about the size and shape of your heart and how well your heart's chambers and valves are working. This procedure takes approximately one hour. There are no restrictions for this procedure. Please do NOT wear cologne, perfume, aftershave, or lotions (deodorant is allowed). Please arrive 15 minutes prior to your appointment time.  Referrals:  none  Special Instructions // Education:  Do the following things EVERYDAY: Weigh yourself in the morning before breakfast. Write it down and keep it in a log. Take your medicines as prescribed Eat low salt foods--Limit salt (sodium) to 2000 mg per day.  Stay as active as you can everyday Limit all fluids for the day to less than 2 liters   Follow-Up in: 3 weeks with echocardiogram   At the [facility], you and your health needs are our priority. We have a designated team specialized in the treatment of Heart Failure. This Care Team includes your primary Heart Failure Specialized Cardiologist (physician), Advanced Practice Providers (APPs- Physician Assistants and Nurse Practitioners), and Pharmacist who all work together to provide you with the care you need, when you need it.   You may see any of the following providers on your designated Care Team at your next follow up:  Dr. Arvilla Meres Dr. Marca Ancona Dr. Marcos Eke, NP Robbie Lis, [place] [facility] [place], [place] Brynda Peon, NP Karle Plumber, PharmD   Please be sure to bring in all your medications bottles to every appointment.   Need to Contact us:  If you have any questions or concerns before your next appointment please send [place] a message through [place] or call our office at [phone].    TO LEAVE A MESSAGE FOR THE NURSE SELECT OPTION 2, PLEASE LEAVE A MESSAGE INCLUDING: YOUR NAME DATE OF BIRTH CALL BACK NUMBER REASON FOR CALL**this is important as we prioritize the call backs  YOU WILL RECEIVE A CALL BACK THE SAME DAY AS LONG AS YOU CALL BEFORE 4:00 PM

## 2023-07-25 NOTE — Progress Notes (Signed)
ADVANCED HF CLINIC NOTE PCP: Dr. Neena Rhymes HF: Dr. [name] EP: Dr. Graciela Husbands   HPI:  Mr Golliher is 57 year old with a PMH of severe systolic heart failure due to NICM, DM2, former polysubstance and tobacco abuse.    Echo 2008 EF 15% in setting of polysubstance abuse.   ECHO 12/2007 EF 55-60%  Echo 10/20 EF 55-60% no RWMA.   On 06/07/20 had TIA-like symptoms with mild aphasia and weakness (dropped a can with left hand). Went to PCP had carotid u/s and echo. Carotid u/s 06/22/20 1-39% bilaterally. Echo 8/21 EF down to 25-30%. Brain MRI/MRA. Normal vasculature.  Left corona radiata diffusion-weighted/FLAIR hyperintensity is nonspecific. Differential includes active demyelinating lesionversus subacute insult.  Underwent R/L cath 07/01/20 LAD 100% prox LCX 20% RCA 10%  EF 25-30%  After cath underwent cMRI.for viability  - LVEF 26% - LGE suggestive of a very large, wrap-around LAD infarction with extensive no-reflow affecting the subendocardium.   Zio monitored placed for palpitations in 10/21 and found to have VT. Underwent ICD placement by Dr. Graciela Husbands on 08/22/20. Was also found to have volume overload and AF.  Here for routine f/u. Feels fine. Still working with his dogs. No CP or SOB. No edema, orthopnea or PND.  Echo 05/29/23 EF 35-40% Personally reviewed  Admitted 9/3 with ICD shocks found to have AF that degenerated to VT. Started amio. Underwent TEE/DC-CV for AF. Post DC-CV complicated by transient hypotension. TEE 25%  Here for f/u. Feeling better but remains anxious. Gets fatigued easily. Not SOB or swelling. No CP or ICD firings. Accidentally was off Eliquis. Now back on it. During hosp Entresto and carvedilol stopped. Cleda Daub cut back    ICD interrogated HL 13, No VT or AF since hospital. Fluid up. S3 up Activity level 1.8h/day. Personally reviewed    Past Medical History:  Diagnosis Date   AICD (automatic cardioverter/defibrillator) present    Allergy     Arthritis    Atrial fibrillation (HCC)    CHF (congestive heart failure) (HCC)    CHF (congestive heart failure) (HCC)    Diabetes mellitus without complication (HCC)    Drug abuse (HCC)    Eczema    GERD (gastroesophageal reflux disease)    Gout    Heart attack (HCC) 06/10/2020   History of kidney stones    Hyperlipidemia    Hypertension    MI (myocardial infarction) (HCC)    PE (pulmonary embolism)    TIA (transient ischemic attack)    hx of per pt - pt not aware he had not followed by a neurologist   Ventricular tachycardia (HCC)     Current Outpatient Medications  Medication Sig Dispense Refill   allopurinol (ZYLOPRIM) 300 MG tablet TAKE 2 TABLETS (600 MG TOTAL) BY MOUTH DAILY. 60 tablet 6   amiodarone (PACERONE) 200 MG tablet Take 2 tablets (400 mg total) by mouth daily for 14 days. 28 tablet 0   aspirin EC 81 MG tablet Take 81 mg by mouth daily. Swallow whole.     atorvastatin (LIPITOR) 80 MG tablet Take 80 mg by mouth daily.     calcium carbonate (TUMS - DOSED IN MG ELEMENTAL CALCIUM) 500 MG chewable tablet Chew 2-3 tablets by mouth daily as needed for indigestion or heartburn.     cetirizine (ZYRTEC) 10 MG tablet Take 10 mg by mouth daily.     cyclobenzaprine (FLEXERIL) 10 MG tablet TAKE 1 TABLET BY MOUTH 3 TIMES DAILY AS NEEDED FOR MUSCLE SPASMS. 45  tablet 1   ELIQUIS 5 MG TABS tablet TAKE 1 TABLET BY MOUTH 2 TIMES A DAY 60 tablet 11   Evolocumab (REPATHA SURECLICK) 140 MG/ML SOAJ Inject 140 mg into the skin every 14 (fourteen) days. 2 mL 11   FARXIGA 10 MG TABS tablet TAKE 1 TABLET BY MOUTH DAILY BEFORE BREAKFAST. 30 tablet 0   fenofibrate 160 MG tablet TAKE 1 TABLET BY MOUTH DAILY. 90 tablet 1   Magnesium Oxide 400 MG CAPS Take 1 capsule (400 mg total) by mouth in the morning and at bedtime. 60 capsule 11   methocarbamol (ROBAXIN) 500 MG tablet TAKE 1 TABLET BY MOUTH EVERY 6 HOURS AS NEEDED FOR MUSCLE SPASMS. 90 tablet 0   Naphazoline HCl (CLEAR EYES OP) Place 1 drop  into both eyes daily as needed (sore eyes).     pantoprazole (PROTONIX) 40 MG tablet TAKE 1 TABLET BY MOUTH DAILY. 30 tablet 2   Semaglutide,0.25 or 0.5MG /DOS, (OZEMPIC, 0.25 OR 0.5 MG/DOSE,) 2 MG/1.5ML SOPN Inject 0.5 mg into the skin once a week. 2 mL 0   sodium chloride (OCEAN) 0.65 % SOLN nasal spray Place 1 spray into both nostrils as needed (dryness).     spironolactone (ALDACTONE) 25 MG tablet Take 0.5 tablets (12.5 mg total) by mouth at bedtime. 15 tablet 5   traZODone (DESYREL) 100 MG tablet TAKE 1 TABLET (100 MG TOTAL) BY MOUTH AT BEDTIME. 90 tablet 1   VASCEPA 1 g capsule TAKE 2 CAPSULES BY MOUTH 2 TIMES DAILY. 120 capsule 3   [START ON 08/03/2023] amiodarone (PACERONE) 200 MG tablet Take 1 tablet (200 mg total) by mouth daily. (Patient not taking: Reported on 07/25/2023) 30 tablet 5   No current facility-administered medications for this encounter.    No Known Allergies    Social History   Socioeconomic History   Marital status: Married    Spouse name: [name]   Number of children: Not on file   Years of education: Not on file   Highest education level: Bachelor's degree (e.g., BA, AB, BS)  Occupational History   Not on file  Tobacco Use   Smoking status: Some Days    Current packs/day: 0.25    Average packs/day: 0.3 packs/day for 31.0 years (7.8 ttl pk-yrs)    Types: Cigarettes   Smokeless tobacco: Never   Tobacco comments:    Stopped a few weeks ago  Vaping Use   Vaping status: Never Used  Substance and Sexual Activity   Alcohol use: Yes    Comment: once a month   Drug use: Not Currently    Types: Marijuana    Comment: gummies every days   Sexual activity: Not on file  Other Topics Concern   Not on file  Social History Narrative   Lives with wife   Caffeine- coffee 4 c daily   Social Determinants of Health   Financial Resource Strain: Low Risk  (07/01/2023)   Overall Financial Resource Strain (CARDIA)    Difficulty of Paying Living Expenses: Not hard at  all  Food Insecurity: No Food Insecurity (07/17/2023)   Hunger Vital Sign    Worried About Running Out of Food in the Last Year: Never true    Ran Out of Food in the Last Year: Never true  Transportation Needs: No Transportation Needs (07/17/2023)   PRAPARE - Administrator, Civil Service (Medical): No    Lack of Transportation (Non-Medical): No  Physical Activity: Insufficiently Active (07/01/2023)   Exercise Vital Sign  Days of Exercise per Week: 2 days    Minutes of Exercise per Session: 30 min  Stress: No Stress Concern Present (07/01/2023)   [employer] of Occupational Health - Occupational Stress Questionnaire    Feeling of Stress : Only a little  Social Connections: Moderately Isolated (07/01/2023)   Social Connection and Isolation Panel [NHANES]    Frequency of Communication with Friends and Family: Once a week    Frequency of Social Gatherings with Friends and Family: Once a week    Attends Religious Services: Never    Database administrator or Organizations: Yes    Attends Engineer, structural: More than 4 times per year    Marital Status: Married  Catering manager Violence: Not At Risk (07/17/2023)   Humiliation, Afraid, Rape, and Kick questionnaire    Fear of Current or Ex-Partner: No    Emotionally Abused: No    Physically Abused: No    Sexually Abused: No      Family History  Problem Relation Age of Onset   Dementia Mother        frontal-temporal   Stroke Mother    Breast cancer Maternal Aunt    Other Brother        overdose   Colon cancer Neg Hx     Vitals:   07/25/23 0939  BP: 122/80  Pulse: 69  SpO2: 97%  Weight: 107.5 kg (237 lb)     PHYSICAL EXAM: General:  Well appearing. No resp difficulty HEENT: normal Neck: supple. no JVD. Carotids 2+ bilat; no bruits. No lymphadenopathy or thryomegaly appreciated. Cor: PMI nondisplaced. Regular rate & rhythm. No rubs, gallops or murmurs. Lungs: clear Abdomen: soft, nontender,  nondistended. No hepatosplenomegaly. No bruits or masses. Good bowel sounds. Extremities: no cyanosis, clubbing, rash, edema Neuro: alert & orientedx3, cranial nerves grossly intact. moves all 4 extremities w/o difficulty. Affect pleasant   ECG: NSR 72 1AVB 230 LAFB. Anterior Qs. Personally reviewed   ASSESSMENT & PLAN:  Chronic Systolic HF due to iCM - Echo 6301 EF 15% in setting of polysubstance abuse.  - ECHO 12/2007 EF 55-60% - Echo 10/20 EF 55-60% no RWMA.  - Echo 8/21 EF 25-30% - Cath 8/21 LAD 100% LCx 20% RCA 10% - cMRI 08/02/20 EF 26% Large anterior infarct - ICD placed 10/21 for VT - Echo 10/17/20 EF 30-35% - Echo 6/22 EF 40-45%  - Echo 05/10/22 EF 40-45% (read as 35-40%)  - Echo 05/29/23 EF 35-40%  - TEE EF 25% in setting of AF/VT - ICD interrogated HL 13, No VT or AF since hospital. Fluid up. S3 up Activity level 1.8h/day. Personally reviewed - Worse NYHA III in setting of recent VT/AF - Volume up -> restart spiro and Entresto. Will give lasix 20mg  to use prn - Off carvedilol due to recent decompensation - Off Entresto 49/51 bid due to recent hypotension. BP now better. Restart Entresto 49/51 bid - On Farxiga 10 mg daily. - Spiro down from 25 daily -> 12.5 in setting of recent hospitalization. Go back to 25 daily - Labs today - Follow closely. See back 2-3 weeks   2. CAD - cath as above with subacute LAD infarct and CTO LAD - No s/s angina - continue ASA, statin.  - Recent LDL was 13 in 9/23. Followed by Lipid clinic  3. VT - s/p ICD 10/21 - s/p ICD shock in 9/24 - Continue amio 400 daily for now then down to 200mg  daily on 9/21. EP following -  Keep K. 4.0 Mg > 2.0  4. Persistent AF - In NSR here. No AF on ICD - Continue Eliquis 5 mg bid. No bleeding  5. Tobacco use -  still working on quitting. Just a few puffs in the last few days.  - Discussed need to quit to permit advanced therapies if needed in future  6. DM2 - continue SGLT2i, metformin,  GLP1-RA  7. Hx Possible TIA - carotid dopplers ok - Brain MRI/MRA. Vasculature ok. Findings suspicious for cardio-embolism.   8. Hypothyroidism - Per PCP. Nwatch TFTs now that he is back on amio  9. Obesity - Body mass index is 30.43 kg/m. - Continue GLP1-RA  Total time spent 45 minutes. Over half that time spent discussing above.    Arvilla Meres, MD  9:56 AM

## 2023-07-26 ENCOUNTER — Other Ambulatory Visit (HOSPITAL_COMMUNITY): Payer: Self-pay

## 2023-07-26 MED ORDER — POTASSIUM CHLORIDE CRYS ER 20 MEQ PO TBCR: EXTENDED_RELEASE_TABLET | ORAL | 5 refills | Status: AC

## 2023-07-26 MED ORDER — FUROSEMIDE 20 MG PO TABS: ORAL_TABLET | ORAL | 5 refills | Status: AC

## 2023-07-29 ENCOUNTER — Inpatient Hospital Stay: Payer: BC Managed Care – PPO | Admitting: Family Medicine

## 2023-07-29 ENCOUNTER — Encounter: Payer: Self-pay | Admitting: Family Medicine

## 2023-08-08 ENCOUNTER — Other Ambulatory Visit (HOSPITAL_COMMUNITY): Payer: Self-pay | Admitting: Surgery

## 2023-08-08 DIAGNOSIS — M5412 Radiculopathy, cervical region: Secondary | ICD-10-CM

## 2023-08-09 ENCOUNTER — Other Ambulatory Visit (INDEPENDENT_AMBULATORY_CARE_PROVIDER_SITE_OTHER): Payer: BC Managed Care – PPO

## 2023-08-09 ENCOUNTER — Encounter: Payer: Self-pay | Admitting: Family Medicine

## 2023-08-09 ENCOUNTER — Ambulatory Visit: Payer: BC Managed Care – PPO | Admitting: Family Medicine

## 2023-08-09 ENCOUNTER — Other Ambulatory Visit: Payer: BC Managed Care – PPO

## 2023-08-09 ENCOUNTER — Telehealth: Payer: Self-pay

## 2023-08-09 VITALS — BP 110/74 | HR 94 | Temp 98.4°F | Ht 74.0 in | Wt 229.0 lb

## 2023-08-09 DIAGNOSIS — R7989 Other specified abnormal findings of blood chemistry: Secondary | ICD-10-CM | POA: Insufficient documentation

## 2023-08-09 DIAGNOSIS — R946 Abnormal results of thyroid function studies: Secondary | ICD-10-CM

## 2023-08-09 DIAGNOSIS — I5043 Acute on chronic combined systolic (congestive) and diastolic (congestive) heart failure: Secondary | ICD-10-CM | POA: Diagnosis not present

## 2023-08-09 LAB — TSH: TSH: 0.01 u[IU]/mL — ABNORMAL LOW (ref 0.35–5.50)

## 2023-08-09 LAB — T3, FREE: T3, Free: 4.3 pg/mL — ABNORMAL HIGH (ref 2.3–4.2)

## 2023-08-09 LAB — T4, FREE: Free T4: 2.34 ng/dL — ABNORMAL HIGH (ref 0.60–1.60)

## 2023-08-09 NOTE — Patient Instructions (Signed)
Follow up as needed or as scheduled We'll notify you of your lab results and make any changes if needed Keep monitoring your weight and any visible swelling Call with any questions or concerns Stay Safe!  Stay Healthy! I'm SO glad you're ok!!!

## 2023-08-09 NOTE — Assessment & Plan Note (Signed)
New.  Did have hypothyroid but TSH has been low recently.  We stopped his Levothyroxine but TSH remains low.  Suspect this is due to his Amiodarone use.  Discussed that if TSH is again low or T3/T4 are elevated, will need to refer to Endo.  Pt expressed understanding and is in agreement w/ plan.

## 2023-08-09 NOTE — Progress Notes (Signed)
Subjective:    Patient ID: Jason Floyd, male    DOB: 04/09/1966, 57 y.o.   MRN: [id number]  HPI Hospital f/u- pt was admitted 9/3-9/6 after his ICD went off x3 in a 24 hr period.  Shocked due to VT.  TEE showed EF down to 25% and LA severely dilated.  Had direct cardioversion.  Amiodarone was loaded as an inpt.  At d/c Coreg and Entresto were stopped.  Amiodarone was started at 400mg  daily x2 weeks and then decreased to 200mg  daily.  He has seen Cardiology since d/c.  They restarted Entresto.  TSH was low at 0.023, T4 elevated at 2.53.  Not on Levothyroxine.  On Amiodarone.  BNP elevated at 397.6  No CP, palpitations, SOB, edema.   Review of Systems For ROS see HPI     Objective:   Physical Exam Vitals reviewed.  Constitutional:      General: He is not in acute distress.    Appearance: Normal appearance. He is well-developed. He is not ill-appearing.  HENT:     Head: Normocephalic and atraumatic.  Eyes:     Extraocular Movements: Extraocular movements intact.     Conjunctiva/sclera: Conjunctivae normal.     Pupils: Pupils are equal, round, and reactive to light.  Neck:     Thyroid: No thyromegaly.  Cardiovascular:     Rate and Rhythm: Normal rate and regular rhythm.     Pulses: Normal pulses.     Heart sounds: Normal heart sounds. No murmur heard. Pulmonary:     Effort: Pulmonary effort is normal. No respiratory distress.     Breath sounds: Normal breath sounds.  Abdominal:     General: Bowel sounds are normal. There is no distension.     Palpations: Abdomen is soft.  Musculoskeletal:     Cervical back: Normal range of motion and neck supple.     Right lower leg: No edema.     Left lower leg: No edema.  Lymphadenopathy:     Cervical: No cervical adenopathy.  Skin:    General: Skin is warm and dry.  Neurological:     General: No focal deficit present.     Mental Status: He is alert and oriented to person, place, and time.     Cranial Nerves: No cranial  nerve deficit.  Psychiatric:        Mood and Affect: Mood normal.        Behavior: Behavior normal.           Assessment & Plan:

## 2023-08-09 NOTE — Telephone Encounter (Signed)
Patient returned call and has been notified of the result and verbalized understanding.  All questions (if any) were answered.

## 2023-08-09 NOTE — Assessment & Plan Note (Signed)
Ongoing issue for pt.  Thankfully has excellent care w/ Dr [name] and the HF team.  BNP a few weeks ago was elevated.  He took a Lasix and feels that things have been better since then- no swelling, no SOB, no weight change.  Will repeat BNP today.  Pt expressed understanding and is in agreement w/ plan.

## 2023-08-09 NOTE — Telephone Encounter (Addendum)
Called and left a voice message asking patient to give office a call back for his recent labs. Will try calling patient again.   ----- Message from Neena Rhymes sent at 08/09/2023  4:05 PM EDT ----- Your TSH is low and your free T3 and free T4 are high.  This indicates HYPERthyroid (overactive).  Based on this, we are going to refer you to Endocrinology stat (dx hyperthyroid).  And I'm asking for a stat referral- not to freak you out- but to get the ball moving faster.  Endocrine referrals can take awhile and with your heart issues, we don't want anything confusing the picture.

## 2023-08-11 LAB — ECHO TEE: Est EF: 25

## 2023-08-14 ENCOUNTER — Other Ambulatory Visit: Payer: Self-pay | Admitting: Family Medicine

## 2023-08-14 NOTE — Telephone Encounter (Signed)
Medication: Farxiga 10 mg  Directions: Take 1 tablet by mouth daily before breakfast Last given: 07/10/23 Number refills: 0 Last o/v: 08/09/23 (Acute) & 07/05/23 Follow up: 6 months from 07/05/23 appointment Labs: 08/09/23

## 2023-08-15 ENCOUNTER — Ambulatory Visit (HOSPITAL_BASED_OUTPATIENT_CLINIC_OR_DEPARTMENT_OTHER)
Admission: RE | Admit: 2023-08-15 | Discharge: 2023-08-15 | Disposition: A | Discharge status: Home / Self Care | Payer: BC Managed Care – PPO | Source: Ambulatory Visit | Attending: Internal Medicine | Admitting: Internal Medicine

## 2023-08-15 ENCOUNTER — Telehealth (HOSPITAL_COMMUNITY): Payer: Self-pay

## 2023-08-15 ENCOUNTER — Ambulatory Visit (HOSPITAL_COMMUNITY)
Admission: RE | Admit: 2023-08-15 | Discharge: 2023-08-15 | Disposition: A | Discharge status: Home / Self Care | Payer: BC Managed Care – PPO | Source: Ambulatory Visit | Attending: Family Medicine | Admitting: Family Medicine

## 2023-08-15 ENCOUNTER — Encounter (HOSPITAL_COMMUNITY): Payer: Self-pay | Admitting: Internal Medicine

## 2023-08-15 DIAGNOSIS — F17210 Nicotine dependence, cigarettes, uncomplicated: Secondary | ICD-10-CM | POA: Diagnosis not present

## 2023-08-15 DIAGNOSIS — I5022 Chronic systolic (congestive) heart failure: Secondary | ICD-10-CM | POA: Insufficient documentation

## 2023-08-15 DIAGNOSIS — E669 Obesity, unspecified: Secondary | ICD-10-CM | POA: Diagnosis not present

## 2023-08-15 DIAGNOSIS — I5042 Chronic combined systolic (congestive) and diastolic (congestive) heart failure: Secondary | ICD-10-CM

## 2023-08-15 DIAGNOSIS — I110 Hypertensive heart disease with heart failure: Secondary | ICD-10-CM | POA: Insufficient documentation

## 2023-08-15 DIAGNOSIS — Z7982 Long term (current) use of aspirin: Secondary | ICD-10-CM | POA: Diagnosis not present

## 2023-08-15 DIAGNOSIS — I472 Ventricular tachycardia, unspecified: Secondary | ICD-10-CM | POA: Diagnosis not present

## 2023-08-15 DIAGNOSIS — Z4502 Encounter for adjustment and management of automatic implantable cardiac defibrillator: Secondary | ICD-10-CM | POA: Diagnosis not present

## 2023-08-15 DIAGNOSIS — E0590 Thyrotoxicosis, unspecified without thyrotoxic crisis or storm: Secondary | ICD-10-CM | POA: Insufficient documentation

## 2023-08-15 DIAGNOSIS — E119 Type 2 diabetes mellitus without complications: Secondary | ICD-10-CM | POA: Insufficient documentation

## 2023-08-15 DIAGNOSIS — Z79899 Other long term (current) drug therapy: Secondary | ICD-10-CM | POA: Diagnosis not present

## 2023-08-15 DIAGNOSIS — I2510 Atherosclerotic heart disease of native coronary artery without angina pectoris: Secondary | ICD-10-CM | POA: Diagnosis not present

## 2023-08-15 DIAGNOSIS — I4819 Other persistent atrial fibrillation: Secondary | ICD-10-CM | POA: Diagnosis not present

## 2023-08-15 DIAGNOSIS — E785 Hyperlipidemia, unspecified: Secondary | ICD-10-CM | POA: Insufficient documentation

## 2023-08-15 DIAGNOSIS — Z6829 Body mass index (BMI) 29.0-29.9, adult: Secondary | ICD-10-CM | POA: Insufficient documentation

## 2023-08-15 DIAGNOSIS — F1911 Other psychoactive substance abuse, in remission: Secondary | ICD-10-CM | POA: Insufficient documentation

## 2023-08-15 DIAGNOSIS — I255 Ischemic cardiomyopathy: Secondary | ICD-10-CM | POA: Diagnosis not present

## 2023-08-15 DIAGNOSIS — Z7985 Long-term (current) use of injectable non-insulin antidiabetic drugs: Secondary | ICD-10-CM | POA: Insufficient documentation

## 2023-08-15 DIAGNOSIS — E039 Hypothyroidism, unspecified: Secondary | ICD-10-CM | POA: Diagnosis not present

## 2023-08-15 DIAGNOSIS — Z7984 Long term (current) use of oral hypoglycemic drugs: Secondary | ICD-10-CM | POA: Diagnosis not present

## 2023-08-15 DIAGNOSIS — Z7901 Long term (current) use of anticoagulants: Secondary | ICD-10-CM | POA: Diagnosis not present

## 2023-08-15 LAB — ECHOCARDIOGRAM COMPLETE
Area-P 1/2: 1.98 cm2
Calc EF: 43.8 %
S' Lateral: 4.1 cm
Single Plane A2C EF: 34.4 %
Single Plane A4C EF: 52.1 %

## 2023-08-15 MED ORDER — CARVEDILOL 3.125 MG PO TABS: 3.1250 mg | ORAL_TABLET | Freq: Two times a day (BID) | ORAL | 3 refills | Status: DC

## 2023-08-15 NOTE — Telephone Encounter (Signed)
Called patient to see if he is interested in the Cardiac Rehab Program. Patient expressed interest. Explained scheduling process and went over insurance, patient verbalized understanding. Someone from our cardiac rehab staff will contact pt at a later time.

## 2023-08-15 NOTE — Addendum Note (Signed)
Encounter addended by: Dolores Patty, MD on: 08/15/2023 10:16 AM  Actions taken: Charge Capture section accepted

## 2023-08-15 NOTE — Addendum Note (Signed)
Encounter addended by: Noralee Space, RN on: 08/15/2023 10:13 AM  Actions taken: Order list changed, Diagnosis association updated, Clinical Note Signed

## 2023-08-15 NOTE — Telephone Encounter (Signed)
Pt insurance is active and benefits verified through [facility] PPO Co-pay 0, DED 1500/1500 met, out of pocket 5900/4379.89 met, co-insurance 30. no pre-authorization required. Passport, 08/15/2023@135 , REF# [id number]   How many CR sessions are covered? (36 visits for TCR, 72 visits for ICR)72 Is this a lifetime maximum or an annual maximum? annual Has the member used any of these services to date? no Is there a time limit (weeks/months) on start of program and/or program completion? no     Will contact patient to see if he is interested in the Cardiac Rehab Program. If interested, patient will need to complete follow up appt. Once completed, patient will be contacted for scheduling upon review by the RN Navigator.

## 2023-08-15 NOTE — Telephone Encounter (Signed)
Office referral recv'ed, printed and given to RN for review. 

## 2023-08-15 NOTE — Addendum Note (Signed)
Encounter addended by: Dolores Patty, MD on: 08/15/2023 11:54 AM  Actions taken: Clinical Note Signed

## 2023-08-15 NOTE — Patient Instructions (Addendum)
Medication Changes:  START Carvedilol 3.125 mg Twice daily   Lab Work:  none  Testing/Procedures:  none  Referrals:  You have been referred to Cardiac Rehab, they will call you to schedule  Special Instructions // Education:  Do the following things EVERYDAY: Weigh yourself in the morning before breakfast. Write it down and keep it in a log. Take your medicines as prescribed Eat low salt foods--Limit salt (sodium) to 2000 mg per day.  Stay as active as you can everyday Limit all fluids for the day to less than 2 liters   Follow-Up in: 3 months   At the [facility], you and your health needs are our priority. We have a designated team specialized in the treatment of Heart Failure. This Care Team includes your primary Heart Failure Specialized Cardiologist (physician), Advanced Practice Providers (APPs- Physician Assistants and Nurse Practitioners), and Pharmacist who all work together to provide you with the care you need, when you need it.   You may see any of the following providers on your designated Care Team at your next follow up:  Dr. Arvilla Meres Dr. Marca Ancona Dr. Dorthula Nettles Dr. Theresia Bough Tonye Becket, NP Robbie Lis, [place] [facility] [place], [place] Brynda Peon, NP [place] Lee, NP Karle Plumber, PharmD   Please be sure to bring in all your medications bottles to every appointment.   Need to Contact us:  If you have any questions or concerns before your next appointment please send [place] a message through [place] or call our office at [phone].    TO LEAVE A MESSAGE FOR THE NURSE SELECT OPTION 2, PLEASE LEAVE A MESSAGE INCLUDING: YOUR NAME DATE OF BIRTH CALL BACK NUMBER REASON FOR CALL**this is important as we prioritize the call backs  YOU WILL RECEIVE A CALL BACK THE SAME DAY AS LONG AS YOU CALL BEFORE 4:00 PM

## 2023-08-15 NOTE — Progress Notes (Addendum)
ADVANCED HF CLINIC NOTE PCP: Dr. Neena Rhymes HF: Dr. [name] EP: Dr. Graciela Husbands   HPI:  Jason Floyd is 57 year old with a PMH of severe systolic heart failure due to NICM, DM2, former polysubstance and tobacco abuse.    Echo 2008 EF 15% in setting of polysubstance abuse.   ECHO 12/2007 EF 55-60%  Echo 10/20 EF 55-60% no RWMA.   On 06/07/20 had TIA-like symptoms with mild aphasia and weakness (dropped a can with left hand). Went to PCP had carotid u/s and echo. Carotid u/s 06/22/20 1-39% bilaterally. Echo 8/21 EF down to 25-30%. Brain MRI/MRA. Normal vasculature.  Left corona radiata diffusion-weighted/FLAIR hyperintensity is nonspecific. Differential includes active demyelinating lesionversus subacute insult.  Underwent R/L cath 07/01/20 LAD 100% prox LCX 20% RCA 10%  EF 25-30%  After cath underwent cMRI.for viability  - LVEF 26% - LGE suggestive of a very large, wrap-around LAD infarction with extensive no-reflow affecting the subendocardium.   Zio monitored placed for palpitations in 10/21 and found to have VT. Underwent ICD placement by Dr. Graciela Husbands on 08/22/20. Was also found to have volume overload and AF.  Here for routine f/u. Feels fine. Still working with his dogs. No CP or SOB. No edema, orthopnea or PND.  Echo 05/29/23 EF 35-40% Personally reviewed  Admitted 9/3 with ICD shocks found to have AF that degenerated to VT. Started amio. Underwent TEE/DC-CV for AF. Post DC-CV complicated by transient hypotension. TEE 25%  Here for f/u. Feels better. Gets tired more easily. Echo today EF 30-35% (probably closer to 35%). No CP or palpitations. No smoking x 3 weeks. Taking lasix as needed (his FIL was in town for past few days so was eating a lot)  ICD interrogated HL 13-> 16, No VT or AF since hospital. Fluid up but now better. S3 up Activity level 1.8h/day -> 2.6h/day. Personally reviewed    Past Medical History:  Diagnosis Date   AICD (automatic  cardioverter/defibrillator) present    Allergy    Arthritis    Atrial fibrillation (HCC)    CHF (congestive heart failure) (HCC)    CHF (congestive heart failure) (HCC)    Diabetes mellitus without complication (HCC)    Drug abuse (HCC)    Eczema    GERD (gastroesophageal reflux disease)    Gout    Heart attack (HCC) 06/10/2020   History of kidney stones    Hyperlipidemia    Hypertension    MI (myocardial infarction) (HCC)    PE (pulmonary embolism)    TIA (transient ischemic attack)    hx of per pt - pt not aware he had not followed by a neurologist   Ventricular tachycardia (HCC)     Current Outpatient Medications  Medication Sig Dispense Refill   allopurinol (ZYLOPRIM) 300 MG tablet TAKE 2 TABLETS (600 MG TOTAL) BY MOUTH DAILY. 60 tablet 6   amiodarone (PACERONE) 200 MG tablet Take 1 tablet (200 mg total) by mouth daily. 30 tablet 5   aspirin EC 81 MG tablet Take 81 mg by mouth daily. Swallow whole.     atorvastatin (LIPITOR) 80 MG tablet Take 80 mg by mouth daily.     calcium carbonate (TUMS - DOSED IN MG ELEMENTAL CALCIUM) 500 MG chewable tablet Chew 2-3 tablets by mouth daily as needed for indigestion or heartburn.     cetirizine (ZYRTEC) 10 MG tablet Take 10 mg by mouth daily.     cyclobenzaprine (FLEXERIL) 10 MG tablet TAKE 1 TABLET BY MOUTH 3  TIMES DAILY AS NEEDED FOR MUSCLE SPASMS. 45 tablet 1   ELIQUIS 5 MG TABS tablet TAKE 1 TABLET BY MOUTH 2 TIMES A DAY 60 tablet 11   Evolocumab (REPATHA SURECLICK) 140 MG/ML SOAJ Inject 140 mg into the skin every 14 (fourteen) days. 2 mL 11   FARXIGA 10 MG TABS tablet TAKE 1 TABLET BY MOUTH DAILY BEFORE BREAKFAST. 90 tablet 1   fenofibrate 160 MG tablet TAKE 1 TABLET BY MOUTH DAILY. 90 tablet 1   furosemide (LASIX) 20 MG tablet Take 1 tablet by mouth daily as needed for swelling or a weight gain of 3 pounds or more in 24 hours or 5 pounds in one week. 30 tablet 5   Magnesium Oxide 400 MG CAPS Take 1 capsule (400 mg total) by mouth in  the morning and at bedtime. 60 capsule 11   methocarbamol (ROBAXIN) 500 MG tablet TAKE 1 TABLET BY MOUTH EVERY 6 HOURS AS NEEDED FOR MUSCLE SPASMS. 90 tablet 0   Naphazoline HCl (CLEAR EYES OP) Place 1 drop into both eyes daily as needed (sore eyes).     pantoprazole (PROTONIX) 40 MG tablet TAKE 1 TABLET BY MOUTH DAILY. 30 tablet 2   potassium chloride SA (KLOR-CON M20) 20 MEQ tablet Take 1 tablet by mouth when taking Lasix 30 tablet 5   sacubitril-valsartan (ENTRESTO) 49-51 MG Take 1 tablet by mouth 2 (two) times daily. 60 tablet 3   Semaglutide,0.25 or 0.5MG /DOS, (OZEMPIC, 0.25 OR 0.5 MG/DOSE,) 2 MG/1.5ML SOPN Inject 0.5 mg into the skin once a week. 2 mL 0   sodium chloride (OCEAN) 0.65 % SOLN nasal spray Place 1 spray into both nostrils as needed (dryness).     spironolactone (ALDACTONE) 25 MG tablet Take 1 tablet (25 mg total) by mouth at bedtime. 30 tablet 6   traZODone (DESYREL) 100 MG tablet TAKE 1 TABLET (100 MG TOTAL) BY MOUTH AT BEDTIME. 90 tablet 1   VASCEPA 1 g capsule TAKE 2 CAPSULES BY MOUTH 2 TIMES DAILY. 120 capsule 3   No current facility-administered medications for this encounter.    No Known Allergies    Social History   Socioeconomic History   Marital status: Married    Spouse name: [name]   Number of children: Not on file   Years of education: Not on file   Highest education level: Bachelor's degree (e.g., BA, AB, BS)  Occupational History   Not on file  Tobacco Use   Smoking status: Some Days    Current packs/day: 0.25    Average packs/day: 0.3 packs/day for 31.0 years (7.8 ttl pk-yrs)    Types: Cigarettes   Smokeless tobacco: Never   Tobacco comments:    Stopped a few weeks ago  Vaping Use   Vaping status: Never Used  Substance and Sexual Activity   Alcohol use: Yes    Comment: once a month   Drug use: Not Currently    Types: Marijuana    Comment: gummies every days   Sexual activity: Not on file  Other Topics Concern   Not on file  Social  History Narrative   Lives with wife   Caffeine- coffee 4 c daily   Social Determinants of Health   Financial Resource Strain: Low Risk  (07/01/2023)   Overall Financial Resource Strain (CARDIA)    Difficulty of Paying Living Expenses: Not hard at all  Food Insecurity: No Food Insecurity (07/17/2023)   Hunger Vital Sign    Worried About Programme researcher, broadcasting/film/video in  the Last Year: Never true    Ran Out of Food in the Last Year: Never true  Transportation Needs: No Transportation Needs (07/17/2023)   PRAPARE - Administrator, Civil Service (Medical): No    Lack of Transportation (Non-Medical): No  Physical Activity: Insufficiently Active (07/01/2023)   Exercise Vital Sign    Days of Exercise per Week: 2 days    Minutes of Exercise per Session: 30 min  Stress: No Stress Concern Present (07/01/2023)   [employer] of Occupational Health - Occupational Stress Questionnaire    Feeling of Stress : Only a little  Social Connections: Moderately Isolated (07/01/2023)   Social Connection and Isolation Panel [NHANES]    Frequency of Communication with Friends and Family: Once a week    Frequency of Social Gatherings with Friends and Family: Once a week    Attends Religious Services: Never    Database administrator or Organizations: Yes    Attends Engineer, structural: More than 4 times per year    Marital Status: Married  Catering manager Violence: Not At Risk (07/17/2023)   Humiliation, Afraid, Rape, and Kick questionnaire    Fear of Current or Ex-Partner: No    Emotionally Abused: No    Physically Abused: No    Sexually Abused: No      Family History  Problem Relation Age of Onset   Dementia Mother        frontal-temporal   Stroke Mother    Breast cancer Maternal Aunt    Other Brother        overdose   Colon cancer Neg Hx     Vitals:   08/15/23 0931  BP: 100/60  Pulse: 82  SpO2: 97%  Weight: 105.6 kg (232 lb 12.8 oz)      PHYSICAL EXAM: General:  Well  appearing. No resp difficulty HEENT: normal Neck: supple. no JVD. Carotids 2+ bilat; no bruits. No lymphadenopathy or thryomegaly appreciated. Cor: PMI nondisplaced. Regular rate & rhythm. No rubs, gallops or murmurs. Lungs: clear Abdomen: soft, nontender, nondistended. No hepatosplenomegaly. No bruits or masses. Good bowel sounds. Extremities: no cyanosis, clubbing, rash, edema Neuro: alert & oriented x 3, cranial nerves grossly intact. moves all 4 extremities w/o difficulty. Affect pleasant   ASSESSMENT & PLAN:  Chronic Systolic HF due to iCM - Echo 1610 EF 15% in setting of polysubstance abuse.  - ECHO 12/2007 EF 55-60% - Echo 10/20 EF 55-60% no RWMA.  - Echo 8/21 EF 25-30% - Cath 8/21 LAD 100% LCx 20% RCA 10% - cMRI 08/02/20 EF 26% Large anterior infarct - ICD placed 10/21 for VT - Echo 10/17/20 EF 30-35% - Echo 6/22 EF 40-45%  - Echo 05/10/22 EF 40-45% (read as 35-40%)  - Echo 05/29/23 EF 35-40%  - TEE EF 25% in setting of AF/VT - ICD interrogated today HL 13-> 16, No VT/AF since hospital. Fluid up but now better. S3 up Activity level 1.8h/day -> 2.6h/day. Personally reviewed - Stable NYHA III - Volume improved with PRN lasix - Off carvedilol due to recent decompensation. Restart carvedilol 3.125 bid - Continue Entresto 49/51 bid - On Farxiga 10 mg daily. - Continue spiro 25 daily - Refer to cardiac rehab  2. CAD - cath as above with subacute LAD infarct and CTO LAD - No s/s angina - continue ASA, statin.  - Recent LDL was 13 in 9/23. Followed by Lipid clinic  3. VT - s/p ICD 10/21 - s/p ICD shock in  9/24 - Continue amio 200 dail EP following - Keep K. 4.0 Mg > 2.0  4. Persistent AF - Remains in NSR today and on ICD - Continue Eliquis 5 mg bid. No bleeding  5. Tobacco use - Has been quit x 3 weeks - Discussed need to stay quit to permit advanced therapies if needed in future  6. DM2 - continue SGLT2i, metformin, GLP1-RA  7. Hx Possible TIA - carotid dopplers  ok - Brain MRI/MRA. Vasculature ok. Findings suspicious for cardio-embolism.   8. Hypothyroidism - Per PCP. Watch TFTs now that he is back on amio - Now he is mildly hyperthyroid. Referred to Endo.   9. Obesity - Body mass index is 29.89 kg/m. - Continue GLP1-RA  Arvilla Meres, MD  9:57 AM

## 2023-08-22 ENCOUNTER — Telehealth: Payer: Self-pay | Admitting: Pharmacist

## 2023-08-22 NOTE — Telephone Encounter (Signed)
Call to titrate Ozempic dose from 0.5 mg once a week to 1 mg once week.   N/A, LVM.

## 2023-08-23 MED ORDER — OZEMPIC (1 MG/DOSE) 2 MG/1.5ML ~~LOC~~ SOPN: 1.0000 mg | PEN_INJECTOR | SUBCUTANEOUS | 0 refills | Status: DC

## 2023-08-23 NOTE — Addendum Note (Signed)
Addended by: Tylene Fantasia on: 08/23/2023 08:30 AM   Modules accepted: Orders

## 2023-08-23 NOTE — Telephone Encounter (Signed)
Spoke to patient and addressed Ozempic administration related concern and sent Video link on how to use Ozempic pen via MyChart.

## 2023-08-23 NOTE — Telephone Encounter (Signed)
Patient LVM. Doing ok on 0.5 mg dose. Ready to move on 1 mg dose for Ozempic.

## 2023-08-26 ENCOUNTER — Encounter: Payer: Self-pay | Admitting: Internal Medicine

## 2023-08-28 ENCOUNTER — Other Ambulatory Visit: Payer: Self-pay | Admitting: Internal Medicine

## 2023-08-29 NOTE — Progress Notes (Signed)
Electrophysiology Office Note:   Date:  08/30/2023  ID:  Jason Floyd, DOB [birth year], MRN [id number]  Primary Cardiologist: None Electrophysiologist: Sherryl Manges, MD      History of Present Illness:   Jason Floyd is a 57 y.o. male with h/o DM, chronic back pain, former smoker/poly substance abuse, NICM that had recovered >> CAD w/ICM s/p ICD (08/2020), chronic CHF, HTN, HLD, PE, DM, hx of ?TIA, atrial fibrillation and VT c/b device shocks who is seen today for routine electrophysiology followup.   Patient was admitted on 07/17/2023 for device shocks.  He had had at least 3 device shocks in the 1 to 2 days leading up to his hospitalization.  Device interrogation showed dual tachycardia with both atrial fibrillation, which began on September 1 and subsequent development of fast ventricular tachycardia.  He had been on amiodarone for many years up with good control, but this was discontinued January 2024 because of his young age and history of hypothyroidism.  Amiodarone was restarted during his hospitalization in September.  Since discharge the patient reports doing relatively well. No known recurrences of atrial fibrillation or VT. No device therapies.  He denies chest pain, palpitations, dyspnea, PND, orthopnea, nausea, vomiting, dizziness, syncope, edema, weight gain, or early satiety.   Review of systems complete and found to be negative unless listed in HPI.   EP Information / Studies Reviewed:    EKG is not ordered today. EKG from 07/25/23 reviewed which showed normal sinus rhythm.      Echo 08/15/23:  LV function moderately decreased.  LVEF 30 to 35%.  Global hypokinesis.  Mild concentric LVH.  Grade 1 diastolic dysfunction. Normal RV size and function. Mildly dilated left atrium.  Right atrium is normal in size. No significant valvular disease.  MRI 08/02/20:  IMPRESSION: 1. Normal LV size with mild LVH. EF 26% with wall motion abnormalities as noted above.    2.  Normal RV size with mildly decreased systolic function, EF 43%.   3. The delayed enhancement pattern is difficult. It could be suggestive of a very large, wrap-around LAD infarction with extensive no-reflow affecting the subendocardium. However, the mid-wall and subepicardial location of the LGE looks more like a viral myocarditis-type picture to me. If it is indeed LAD infarction with extensive no-reflow, the affected segments would have >50% wall thickness no-reflow + LGE, so would be unlikely to improve with revascularization.  Risk Assessment/Calculations:    CHA2DS2-VASc Score =   4  This indicates a  % annual risk of stroke. The patient's score is based upon: CHF History: 1 HTN History: 1 Diabetes History: 1 Vascular Disease History: 1 Age Score: 0 Gender Score: 0             Physical Exam:   VS:  BP 104/64   Pulse 73   Ht 6\' 2"  (1.88 m)   Wt 234 lb (106.1 kg)   SpO2 96%   BMI 30.04 kg/m    Wt Readings from Last 3 Encounters:  08/30/23 234 lb (106.1 kg)  08/15/23 232 lb 12.8 oz (105.6 kg)  08/09/23 229 lb (103.9 kg)     GEN: Well nourished, well developed in no acute distress NECK: No JVD; No carotid bruits CARDIAC: Regular rate and rhythm, no murmurs, rubs, gallops RESPIRATORY:  Clear to auscultation without rales, wheezing or rhonchi  ABDOMEN: Soft, non-tender, non-distended EXTREMITIES:  No edema; No deformity   ASSESSMENT AND PLAN:   Jason Floyd is a 57 y.o. male  with h/o DM, chronic back pain, former smoker/poly substance abuse, NICM that had recovered >> CAD w/ICM s/p ICD (08/2020), chronic CHF, HTN, HLD, PE, DM, hx of ?TIA, atrial fibrillation and VT c/b device shocks who is seen today for routine electrophysiology followup.   He had a recent hospitalization for ICD shocks.  On interrogation, he had initially developed atrial fibrillation.  After being in atrial fibrillation he then developed ventricular tachycardia for which she  received appropriate therapies.  #Persistent atrial fibrillation: It is certainly possible that his atrial fibrillation could have been a trigger for his ventricular tachycardia.  He also has a low LVEF.  For these reasons, I think aggressive rhythm control would be appropriate.  He is on amiodarone; however, would like to eventually retrial transitioning him off of this medication. - Discussed treatment options today for AF including continuing antiarrhythmic drug therapy and ablation. Discussed risks, recovery and likelihood of success with each treatment strategy. Risk, benefits, and alternatives to EP study and ablation for afib were discussed. These risks include but are not limited to stroke, bleeding, vascular damage, tamponade, perforation, damage to the esophagus, lungs, phrenic nerve and other structures, pulmonary vein stenosis, worsening renal function, coronary vasospasm and death.  Discussed potential need for repeat ablation procedures and antiarrhythmic drugs after an initial ablation. The patient understands these risk and wishes to proceed.  We will therefore proceed with catheter ablation at the next available time.  Carto, ICE, anesthesia are requested for the procedure.  Will also obtain CT PV protocol prior to the procedure to exclude LAA thrombus and further evaluate atrial anatomy.  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  However, his cardiac MRI also suggest a component of nonischemic scar. -Continue amiodarone for now. -He is getting close follow-up of his thyroid function with his family medicine provider. -LFTs normal 07/17/23. -No recent PFTs. Will order today.  #Dual chamber ICD in situ: Device interrogation performed today. Normal device function. Stable/appropriate lead parameters - sensing, threshold, impedance. No programming changes made.One episode of NSVT on 07/26/23. -Continue 3 month follow up.  #Secondary hypercoagulable  state due to atrial fibrillation:  -Continue Eliquis.   #CAD: No angina. -Continue aspirin, statin, and evolocumab.  #Chronic systolic heart failure: Well compensated. -He is on an excellent regimen of GDMT. -Continue follow up with Dr. [name].  Signed, Nobie Putnam, MD

## 2023-08-30 ENCOUNTER — Ambulatory Visit: Payer: BC Managed Care – PPO | Attending: Cardiology | Admitting: Cardiology

## 2023-08-30 ENCOUNTER — Encounter: Payer: Self-pay | Admitting: Cardiology

## 2023-08-30 VITALS — BP 104/64 | HR 73 | Ht 74.0 in | Wt 234.0 lb

## 2023-08-30 DIAGNOSIS — D6869 Other thrombophilia: Secondary | ICD-10-CM | POA: Diagnosis not present

## 2023-08-30 DIAGNOSIS — I472 Ventricular tachycardia, unspecified: Secondary | ICD-10-CM | POA: Diagnosis not present

## 2023-08-30 DIAGNOSIS — I5042 Chronic combined systolic (congestive) and diastolic (congestive) heart failure: Secondary | ICD-10-CM

## 2023-08-30 DIAGNOSIS — Z95810 Presence of automatic (implantable) cardiac defibrillator: Secondary | ICD-10-CM

## 2023-08-30 DIAGNOSIS — I480 Paroxysmal atrial fibrillation: Secondary | ICD-10-CM | POA: Diagnosis not present

## 2023-08-30 MED ORDER — SEMAGLUTIDE (1 MG/DOSE) 4 MG/3ML ~~LOC~~ SOPN: 1.0000 mg | PEN_INJECTOR | SUBCUTANEOUS | 0 refills | Status: DC

## 2023-08-30 NOTE — Patient Instructions (Addendum)
Medication Instructions:  Your physician recommends that you continue on your current medications as directed. Please refer to the Current Medication list given to you today.  *If you need a refill on your cardiac medications before your next appointment, please call your pharmacy*  Lab Work: TODAY: BMET and CBC  If you have labs (blood work) drawn today and your tests are completely normal, you will receive your results only by: MyChart Message (if you have MyChart) OR A paper copy in the mail If you have any lab test that is abnormal or we need to change your treatment, we will call you to review the results.  Testing/Procedures: Your physician has requested that you have cardiac CT. Cardiac computed tomography (CT) is a painless test that uses an x-ray machine to take clear, detailed pictures of your heart. For further information please visit [email or web address] Please follow instruction sheet as given.  Your physician has recommended that you have an ablation. Catheter ablation is a medical procedure used to treat some cardiac arrhythmias (irregular heartbeats). During catheter ablation, a long, thin, flexible tube is put into a blood vessel in your groin (upper thigh), or neck. This tube is called an ablation catheter. It is then guided to your heart through the blood vessel. Radio frequency waves destroy small areas of heart tissue where abnormal heartbeats may cause an arrhythmia to start. Please see the instruction sheet given to you today.  Follow-Up: At [facility], you and your health needs are our priority.  As part of our continuing mission to provide you with exceptional heart care, we have created designated Provider Care Teams.  These Care Teams include your primary Cardiologist (physician) and Advanced Practice Providers (APPs -  Physician Assistants and Nurse Practitioners) who all work together to provide you with the care you need, when you need it.    Your next  appointment:   We will call you to schedule your follow up appointments

## 2023-08-30 NOTE — Telephone Encounter (Signed)
Rx resent per fax from pharmacy - sent as 2mg /1.71mL and they only have 4mg /13mL size.

## 2023-08-30 NOTE — Addendum Note (Signed)
Addended by: Skylor Hughson E on: 08/30/2023 07:07 AM   Modules accepted: Orders

## 2023-08-31 LAB — BASIC METABOLIC PANEL
BUN/Creatinine Ratio: 12 (ref 9–20)
BUN: 16 mg/dL (ref 6–24)
CO2: 23 mmol/L (ref 20–29)
Calcium: 10.1 mg/dL (ref 8.7–10.2)
Chloride: 99 mmol/L (ref 96–106)
Creatinine, Ser: 1.36 mg/dL — ABNORMAL HIGH (ref 0.76–1.27)
Glucose: 169 mg/dL — ABNORMAL HIGH (ref 70–99)
Potassium: 4.6 mmol/L (ref 3.5–5.2)
Sodium: 137 mmol/L (ref 134–144)
eGFR: 60 mL/min/{1.73_m2} (ref >59)

## 2023-08-31 LAB — CBC
Hematocrit: 48.4 % (ref 37.5–51.0)
Hemoglobin: 16.1 g/dL (ref 13.0–17.7)
MCH: 31.1 pg (ref 26.6–33.0)
MCHC: 33.3 g/dL (ref 31.5–35.7)
MCV: 94 fL (ref 79–97)
Platelets: 289 10*3/uL (ref 150–450)
RBC: 5.17 x10E6/uL (ref 4.14–5.80)
RDW: 12.3 % (ref 11.6–15.4)
WBC: 10.3 10*3/uL (ref 3.4–10.8)

## 2023-09-03 ENCOUNTER — Encounter (HOSPITAL_COMMUNITY): Payer: Self-pay | Admitting: Internal Medicine

## 2023-09-05 ENCOUNTER — Telehealth (HOSPITAL_COMMUNITY): Payer: Self-pay | Admitting: Emergency Medicine

## 2023-09-05 NOTE — Telephone Encounter (Signed)
Reaching out to patient to offer assistance regarding upcoming cardiac imaging study; pt verbalizes understanding of appt date/time, parking situation and where to check in, pre-test NPO status and medications ordered, and verified current allergies; name and call back number provided for further questions should they arise Cayne Yom RN Navigator Cardiac Imaging [facility] [phone] office [phone] cell 

## 2023-09-05 NOTE — Telephone Encounter (Signed)
Attempted to call patient regarding upcoming cardiac CT appointment. °Left message on voicemail with name and callback number °Dwan Fennel RN Navigator Cardiac Imaging °[facility] °[phone] Office °[phone] Cell ° °

## 2023-09-06 ENCOUNTER — Ambulatory Visit (HOSPITAL_COMMUNITY)
Admission: RE | Admit: 2023-09-06 | Discharge: 2023-09-06 | Disposition: A | Discharge status: Home / Self Care | Payer: BC Managed Care – PPO | Source: Ambulatory Visit | Attending: Cardiology | Admitting: Cardiology

## 2023-09-06 DIAGNOSIS — I480 Paroxysmal atrial fibrillation: Secondary | ICD-10-CM | POA: Insufficient documentation

## 2023-09-06 DIAGNOSIS — I5042 Chronic combined systolic (congestive) and diastolic (congestive) heart failure: Secondary | ICD-10-CM | POA: Diagnosis present

## 2023-09-06 MED ORDER — IOHEXOL 350 MG/ML SOLN
95.0000 mL | Freq: Once | INTRAVENOUS | Status: AC | PRN
  Administered 2023-09-06: 95 mL via INTRAVENOUS

## 2023-09-08 NOTE — Pre-Procedure Instructions (Signed)
Patient is scheduled for anesthesia procedure on 11/6.  Anesthesia requires Ozempic to be held for 7 days before procedure.  Pt will take last dose on Tuesday 10/29 until after procedure.

## 2023-09-11 ENCOUNTER — Other Ambulatory Visit: Payer: Self-pay | Admitting: Family Medicine

## 2023-09-11 ENCOUNTER — Other Ambulatory Visit (HOSPITAL_COMMUNITY): Payer: Self-pay | Admitting: Internal Medicine

## 2023-09-16 ENCOUNTER — Encounter: Payer: Self-pay | Admitting: Cardiology

## 2023-09-17 ENCOUNTER — Encounter: Payer: Self-pay | Admitting: Cardiology

## 2023-09-17 ENCOUNTER — Other Ambulatory Visit: Payer: Self-pay | Admitting: Cardiology

## 2023-09-17 ENCOUNTER — Telehealth: Payer: Self-pay

## 2023-09-17 DIAGNOSIS — I480 Paroxysmal atrial fibrillation: Secondary | ICD-10-CM

## 2023-09-17 DIAGNOSIS — R911 Solitary pulmonary nodule: Secondary | ICD-10-CM

## 2023-09-17 NOTE — Telephone Encounter (Signed)
LM for pt to call back to reschedule his Ablation with Dr. Jimmey Ralph. He was scheduled on 11/6 but CT shows lung nodule - Dr Jimmey Ralph has ordered him to have Non Contrast CT.  Pt called back and he is scheduled for 11/22 at 10:30. I will send updated instruction letter via MyChart.   When he goes for CT scan he will come by our office to have updated labs.

## 2023-09-18 MED ORDER — OZEMPIC (2 MG/DOSE) 8 MG/3ML ~~LOC~~ SOPN: 2.0000 mg | PEN_INJECTOR | SUBCUTANEOUS | 11 refills | Status: DC

## 2023-09-18 NOTE — Addendum Note (Signed)
Addended by: Tylene Fantasia on: 09/18/2023 07:43 PM   Modules accepted: Orders

## 2023-09-19 ENCOUNTER — Encounter: Payer: Self-pay | Admitting: Cardiology

## 2023-09-22 NOTE — Pre-Procedure Instructions (Signed)
Patient is scheduled for procedure with anesthesia on 11/22.  He takes Ozempic, that has to be held for 7 days before procedure.  He will take dose this ednesday 11/13, but will not take next Wednesday.

## 2023-09-25 ENCOUNTER — Ambulatory Visit (HOSPITAL_COMMUNITY)
Admission: RE | Admit: 2023-09-25 | Discharge: 2023-09-25 | Disposition: A | Discharge status: Home / Self Care | Payer: BC Managed Care – PPO | Source: Ambulatory Visit | Attending: Cardiology | Admitting: Cardiology

## 2023-09-25 ENCOUNTER — Other Ambulatory Visit: Payer: Self-pay | Admitting: Family Medicine

## 2023-09-25 DIAGNOSIS — R911 Solitary pulmonary nodule: Secondary | ICD-10-CM | POA: Diagnosis present

## 2023-09-25 LAB — HM DIABETES EYE EXAM

## 2023-09-26 ENCOUNTER — Encounter: Payer: Self-pay | Admitting: Cardiology

## 2023-09-27 ENCOUNTER — Other Ambulatory Visit (HOSPITAL_COMMUNITY): Payer: Self-pay

## 2023-10-01 ENCOUNTER — Other Ambulatory Visit: Payer: Self-pay

## 2023-10-01 ENCOUNTER — Telehealth: Payer: Self-pay

## 2023-10-01 DIAGNOSIS — R911 Solitary pulmonary nodule: Secondary | ICD-10-CM

## 2023-10-01 DIAGNOSIS — I480 Paroxysmal atrial fibrillation: Secondary | ICD-10-CM

## 2023-10-01 LAB — BASIC METABOLIC PANEL
BUN/Creatinine Ratio: 12 (ref 9–20)
BUN: 17 mg/dL (ref 6–24)
CO2: 27 mmol/L (ref 20–29)
Calcium: 10 mg/dL (ref 8.7–10.2)
Chloride: 100 mmol/L (ref 96–106)
Creatinine, Ser: 1.38 mg/dL — ABNORMAL HIGH (ref 0.76–1.27)
Glucose: 148 mg/dL — ABNORMAL HIGH (ref 70–99)
Potassium: 4.5 mmol/L (ref 3.5–5.2)
Sodium: 136 mmol/L (ref 134–144)
eGFR: 59 mL/min/{1.73_m2} — ABNORMAL LOW (ref >59)

## 2023-10-01 LAB — CBC
Hematocrit: 45 % (ref 37.5–51.0)
Hemoglobin: 15.3 g/dL (ref 13.0–17.7)
MCH: 31 pg (ref 26.6–33.0)
MCHC: 34 g/dL (ref 31.5–35.7)
MCV: 91 fL (ref 79–97)
Platelets: 252 10*3/uL (ref 150–450)
RBC: 4.93 x10E6/uL (ref 4.14–5.80)
RDW: 14 % (ref 11.6–15.4)
WBC: 9.6 10*3/uL (ref 3.4–10.8)

## 2023-10-01 NOTE — Telephone Encounter (Signed)
Spoke to pt and informed him that Dr Jimmey Ralph has read his CT report and he is ok to proceed with his Afib Ablation on 11/22. He is aware that Dr. Jimmey Ralph is going to refer him to pulmonologist. Deirdre Evener will put in that referral.  Pt will come in today 11/19 for labs. Pt verbalized that he has held his Ozempic since 11/13 and [place] since 11/18.  I will send updated Instruction letter via MyChart.

## 2023-10-01 NOTE — Addendum Note (Signed)
Addended by: Frutoso Schatz on: 10/01/2023 11:54 AM   Modules accepted: Orders

## 2023-10-03 ENCOUNTER — Telehealth: Payer: Self-pay

## 2023-10-03 NOTE — Anesthesia Preprocedure Evaluation (Addendum)
Anesthesia Evaluation  Patient identified by MRN, date of birth, ID band Patient awake    Reviewed: Allergy & Precautions, NPO status , Patient's Chart, lab work & pertinent test results, reviewed documented beta blocker date and time   Airway Mallampati: IV  TM Distance: >3 FB Neck ROM: Full    Dental  (+) Teeth Intact, Dental Advisory Given   Pulmonary Patient abstained from smoking., former smoker, PE   Pulmonary exam normal breath sounds clear to auscultation       Cardiovascular hypertension, Pt. on medications and Pt. on home beta blockers + CAD, + Past MI and +CHF  Normal cardiovascular exam+ dysrhythmias Atrial Fibrillation and Ventricular Tachycardia (-) pacemaker+ Cardiac Defibrillator  Rhythm:Regular Rate:Normal  Echo 08/15/23: 1. Left ventricular ejection fraction, by estimation, is 30 to 35%. The  left ventricle has moderately decreased function. The left ventricle  demonstrates global hypokinesis. There is mild concentric left ventricular  hypertrophy. Left ventricular  diastolic parameters are consistent with Grade I diastolic dysfunction  (impaired relaxation). The average left ventricular global longitudinal  strain is -11.8 %. The global longitudinal strain is abnormal.   2. Right ventricular systolic function is normal. The right ventricular  size is normal.   3. Left atrial size was mildly dilated.   4. The mitral valve is normal in structure. Trivial mitral valve  regurgitation. No evidence of mitral stenosis.   5. The aortic valve is tricuspid. Aortic valve regurgitation is not  visualized. No aortic stenosis is present.   6. The inferior vena cava is normal in size with greater than 50%  respiratory variability, suggesting right atrial pressure of 3 mmHg.      Neuro/Psych TIA Neuromuscular disease  negative psych ROS   GI/Hepatic Neg liver ROS,GERD  Medicated,,  Endo/Other  diabetes, Type 2, Oral  Hypoglycemic AgentsHypothyroidism  Class 3 obesity  Renal/GU Renal InsufficiencyRenal disease     Musculoskeletal  (+) Arthritis ,    Abdominal   Peds  Hematology  (+) Blood dyscrasia (Eliquis)   Anesthesia Other Findings   Reproductive/Obstetrics                             Anesthesia Physical Anesthesia Plan  ASA: 4  Anesthesia Plan: General   Post-op Pain Management: Tylenol PO (pre-op)*   Induction: Intravenous  PONV Risk Score and Plan: 1 and Midazolam, Dexamethasone and Ondansetron  Airway Management Planned: Oral ETT and Video Laryngoscope Planned  Additional Equipment: ClearSight  Intra-op Plan:   Post-operative Plan: Extubation in OR  Informed Consent: I have reviewed the patients History and Physical, chart, labs and discussed the procedure including the risks, benefits and alternatives for the proposed anesthesia with the patient or authorized representative who has indicated his/her understanding and acceptance.     Dental advisory given  Plan Discussed with: CRNA  Anesthesia Plan Comments:         Anesthesia Quick Evaluation

## 2023-10-03 NOTE — Telephone Encounter (Signed)
Pt aware of AF Ablation procedure time change on 11/22. He will arrive at 5:30 AM for a 7:30 AM procedure time.

## 2023-10-03 NOTE — Pre-Procedure Instructions (Signed)
Instructed patient on the following items: Arrival time 515 Nothing to eat or drink after midnight No meds AM of procedure Responsible person to drive you home and stay with you for 24 hrs  Have you missed any doses of anti-coagulant Eliquis- takes twice a day, hasn't missed any doses.

## 2023-10-04 ENCOUNTER — Ambulatory Visit (HOSPITAL_COMMUNITY)
Admission: RE | Admit: 2023-10-04 | Discharge: 2023-10-04 | Disposition: A | Discharge status: Home / Self Care | Payer: BC Managed Care – PPO | Attending: Cardiology | Admitting: Cardiology

## 2023-10-04 ENCOUNTER — Other Ambulatory Visit: Payer: Self-pay

## 2023-10-04 ENCOUNTER — Ambulatory Visit (HOSPITAL_COMMUNITY): Payer: BC Managed Care – PPO | Admitting: Anesthesiology

## 2023-10-04 ENCOUNTER — Encounter (HOSPITAL_COMMUNITY)
Admission: RE | Disposition: A | Discharge status: Home / Self Care | Payer: BC Managed Care – PPO | Source: Home / Self Care | Attending: Cardiology

## 2023-10-04 DIAGNOSIS — Z8673 Personal history of transient ischemic attack (TIA), and cerebral infarction without residual deficits: Secondary | ICD-10-CM | POA: Diagnosis not present

## 2023-10-04 DIAGNOSIS — I252 Old myocardial infarction: Secondary | ICD-10-CM | POA: Diagnosis not present

## 2023-10-04 DIAGNOSIS — I428 Other cardiomyopathies: Secondary | ICD-10-CM | POA: Insufficient documentation

## 2023-10-04 DIAGNOSIS — Z87891 Personal history of nicotine dependence: Secondary | ICD-10-CM | POA: Diagnosis not present

## 2023-10-04 DIAGNOSIS — I4819 Other persistent atrial fibrillation: Secondary | ICD-10-CM | POA: Diagnosis present

## 2023-10-04 DIAGNOSIS — I472 Ventricular tachycardia, unspecified: Secondary | ICD-10-CM | POA: Diagnosis not present

## 2023-10-04 DIAGNOSIS — Z95810 Presence of automatic (implantable) cardiac defibrillator: Secondary | ICD-10-CM | POA: Insufficient documentation

## 2023-10-04 DIAGNOSIS — I110 Hypertensive heart disease with heart failure: Secondary | ICD-10-CM | POA: Insufficient documentation

## 2023-10-04 DIAGNOSIS — E785 Hyperlipidemia, unspecified: Secondary | ICD-10-CM | POA: Insufficient documentation

## 2023-10-04 DIAGNOSIS — E119 Type 2 diabetes mellitus without complications: Secondary | ICD-10-CM | POA: Diagnosis not present

## 2023-10-04 DIAGNOSIS — I5022 Chronic systolic (congestive) heart failure: Secondary | ICD-10-CM | POA: Diagnosis not present

## 2023-10-04 DIAGNOSIS — Z79899 Other long term (current) drug therapy: Secondary | ICD-10-CM | POA: Insufficient documentation

## 2023-10-04 DIAGNOSIS — Z7982 Long term (current) use of aspirin: Secondary | ICD-10-CM | POA: Insufficient documentation

## 2023-10-04 DIAGNOSIS — I2510 Atherosclerotic heart disease of native coronary artery without angina pectoris: Secondary | ICD-10-CM | POA: Insufficient documentation

## 2023-10-04 DIAGNOSIS — D6869 Other thrombophilia: Secondary | ICD-10-CM | POA: Insufficient documentation

## 2023-10-04 DIAGNOSIS — E039 Hypothyroidism, unspecified: Secondary | ICD-10-CM | POA: Insufficient documentation

## 2023-10-04 DIAGNOSIS — Z7901 Long term (current) use of anticoagulants: Secondary | ICD-10-CM | POA: Diagnosis not present

## 2023-10-04 HISTORY — PX: ATRIAL FIBRILLATION ABLATION: EP1191

## 2023-10-04 LAB — POCT ACTIVATED CLOTTING TIME
Activated Clotting Time: 291 s
Activated Clotting Time: 308 s

## 2023-10-04 LAB — GLUCOSE, CAPILLARY: Glucose-Capillary: 150 mg/dL — ABNORMAL HIGH (ref 70–99)

## 2023-10-04 SURGERY — ATRIAL FIBRILLATION ABLATION
Anesthesia: General

## 2023-10-04 MED ORDER — PROPOFOL 500 MG/50ML IV EMUL
INTRAVENOUS | Status: DC | PRN
  Administered 2023-10-04: 100 ug/kg/min via INTRAVENOUS

## 2023-10-04 MED ORDER — FENTANYL CITRATE (PF) 100 MCG/2ML IJ SOLN: 25.0000 ug | INTRAMUSCULAR | Status: DC | PRN

## 2023-10-04 MED ORDER — ACETAMINOPHEN 500 MG PO TABS
1000.0000 mg | ORAL_TABLET | Freq: Once | ORAL | Status: AC
  Administered 2023-10-04: 1000 mg via ORAL
  Filled 2023-10-04: qty 2

## 2023-10-04 MED ORDER — LIDOCAINE 2% (20 MG/ML) 5 ML SYRINGE
INTRAMUSCULAR | Status: DC | PRN
  Administered 2023-10-04: 60 mg via INTRAVENOUS

## 2023-10-04 MED ORDER — ROCURONIUM BROMIDE 10 MG/ML (PF) SYRINGE
PREFILLED_SYRINGE | INTRAVENOUS | Status: DC | PRN
  Administered 2023-10-04: 50 mg via INTRAVENOUS
  Administered 2023-10-04 (×2): 20 mg via INTRAVENOUS
  Administered 2023-10-04: 30 mg via INTRAVENOUS

## 2023-10-04 MED ORDER — SODIUM CHLORIDE 0.9 % IV SOLN: 250.0000 mL | INTRAVENOUS | Status: DC | PRN

## 2023-10-04 MED ORDER — PROTAMINE SULFATE 10 MG/ML IV SOLN
INTRAVENOUS | Status: DC | PRN
  Administered 2023-10-04: 25 mg via INTRAVENOUS
  Administered 2023-10-04: 10 mg via INTRAVENOUS

## 2023-10-04 MED ORDER — SODIUM CHLORIDE 0.9% FLUSH
3.0000 mL | Freq: Two times a day (BID) | INTRAVENOUS | Status: DC
Start: 2023-10-04 — End: 2023-10-04

## 2023-10-04 MED ORDER — FENTANYL CITRATE (PF) 100 MCG/2ML IJ SOLN
INTRAMUSCULAR | Status: DC | PRN
  Administered 2023-10-04: 100 ug via INTRAVENOUS
  Administered 2023-10-04: 50 ug via INTRAVENOUS

## 2023-10-04 MED ORDER — FENTANYL CITRATE (PF) 100 MCG/2ML IJ SOLN
INTRAMUSCULAR | Status: AC
  Filled 2023-10-04: qty 2

## 2023-10-04 MED ORDER — ONDANSETRON HCL 4 MG/2ML IJ SOLN
4.0000 mg | Freq: Once | INTRAMUSCULAR | Status: DC | PRN
Start: 2023-10-04 — End: 2023-10-04

## 2023-10-04 MED ORDER — ONDANSETRON HCL 4 MG/2ML IJ SOLN: 4.0000 mg | Freq: Four times a day (QID) | INTRAMUSCULAR | Status: DC | PRN

## 2023-10-04 MED ORDER — SODIUM CHLORIDE 0.9% FLUSH: 3.0000 mL | INTRAVENOUS | Status: DC | PRN

## 2023-10-04 MED ORDER — SUGAMMADEX SODIUM 200 MG/2ML IV SOLN
INTRAVENOUS | Status: DC | PRN
  Administered 2023-10-04: 400 mg via INTRAVENOUS

## 2023-10-04 MED ORDER — CEFAZOLIN SODIUM-DEXTROSE 2-4 GM/100ML-% IV SOLN
INTRAVENOUS | Status: AC
  Filled 2023-10-04: qty 100

## 2023-10-04 MED ORDER — SODIUM CHLORIDE 0.9 % IV SOLN: INTRAVENOUS | Status: DC

## 2023-10-04 MED ORDER — EPHEDRINE SULFATE-NACL 50-0.9 MG/10ML-% IV SOSY
PREFILLED_SYRINGE | INTRAVENOUS | Status: DC | PRN
  Administered 2023-10-04: 5 mg via INTRAVENOUS
  Administered 2023-10-04: 10 mg via INTRAVENOUS

## 2023-10-04 MED ORDER — ONDANSETRON HCL 4 MG/2ML IJ SOLN
INTRAMUSCULAR | Status: DC | PRN
  Administered 2023-10-04: 4 mg via INTRAVENOUS

## 2023-10-04 MED ORDER — PHENYLEPHRINE HCL-NACL 20-0.9 MG/250ML-% IV SOLN
INTRAVENOUS | Status: DC | PRN
  Administered 2023-10-04: 50 ug/min via INTRAVENOUS

## 2023-10-04 MED ORDER — ACETAMINOPHEN 325 MG PO TABS: 650.0000 mg | ORAL_TABLET | ORAL | Status: DC | PRN

## 2023-10-04 MED ORDER — PROPOFOL 10 MG/ML IV BOLUS
INTRAVENOUS | Status: DC | PRN
  Administered 2023-10-04: 100 mg via INTRAVENOUS
  Administered 2023-10-04: 40 mg via INTRAVENOUS

## 2023-10-04 MED ORDER — HEPARIN (PORCINE) IN NACL 1000-0.9 UT/500ML-% IV SOLN
INTRAVENOUS | Status: DC | PRN
  Administered 2023-10-04 (×3): 500 mL

## 2023-10-04 MED ORDER — HEPARIN SODIUM (PORCINE) 1000 UNIT/ML IJ SOLN
INTRAMUSCULAR | Status: DC | PRN
  Administered 2023-10-04: 16000 [IU] via INTRAVENOUS

## 2023-10-04 MED ORDER — APIXABAN 5 MG PO TABS
5.0000 mg | ORAL_TABLET | Freq: Two times a day (BID) | ORAL | Status: DC
  Administered 2023-10-04: 5 mg via ORAL
  Filled 2023-10-04: qty 1

## 2023-10-04 MED ORDER — CEFAZOLIN SODIUM-DEXTROSE 2-3 GM-%(50ML) IV SOLR
INTRAVENOUS | Status: DC | PRN
Start: 2023-10-04 — End: 2023-10-04
  Administered 2023-10-04: 2 g via INTRAVENOUS

## 2023-10-04 SURGICAL SUPPLY — 20 items
BAG SNAP BAND KOVER 36X36 (MISCELLANEOUS) IMPLANT
CABLE PFA RX CATH CONN (CABLE) IMPLANT
CATH FARAWAVE ABLATION 31 (CATHETERS) IMPLANT
CATH OCTARAY 2.0 F 3-3-3-3-3 (CATHETERS) IMPLANT
CATH SOUNDSTAR ECO 8FR (CATHETERS) IMPLANT
CATH WEBSTER BI DIR CS D-F CRV (CATHETERS) IMPLANT
CLOSURE PERCLOSE PROSTYLE (VASCULAR PRODUCTS) IMPLANT
COVER SWIFTLINK CONNECTOR (BAG) ×1 IMPLANT
DEVICE CLOSURE MYNXGRIP 6/7F (Vascular Products) IMPLANT
DILATOR VESSEL 38 20CM 16FR (INTRODUCER) IMPLANT
GUIDEWIRE INQWIRE 1.5J.035X260 (WIRE) IMPLANT
INQWIRE 1.5J .035X260CM (WIRE) ×1
PACK EP LF (CUSTOM PROCEDURE TRAY) ×1 IMPLANT
PAD DEFIB RADIO PHYSIO CONN (PAD) ×1 IMPLANT
PATCH CARTO3 (PAD) IMPLANT
SHEATH FARADRIVE STEERABLE (SHEATH) IMPLANT
SHEATH PINNACLE 8F 10CM (SHEATH) IMPLANT
SHEATH PINNACLE 9F 10CM (SHEATH) IMPLANT
SHEATH PROBE COVER 6X72 (BAG) IMPLANT
SHEATH WIRE KIT BAYLIS SL1 (KITS) IMPLANT

## 2023-10-04 NOTE — Discharge Instructions (Signed)

## 2023-10-04 NOTE — Transfer of Care (Signed)
Immediate Anesthesia Transfer of Care Note  Patient: Jason Floyd  Procedure(s) Performed: ATRIAL FIBRILLATION ABLATION  Patient Location: PACU and Cath Lab  Anesthesia Type:General  Level of Consciousness: awake, patient cooperative, and responds to stimulation  Airway & Oxygen Therapy: Patient Spontanous Breathing and Patient connected to face mask oxygen  Post-op Assessment: Report given to RN and Post -op Vital signs reviewed and stable  Post vital signs: Reviewed and stable  Last Vitals:  Vitals Value Taken Time  BP 94/69 10/04/23 1010  Temp 36.5 C 10/04/23 1006  Pulse 67 10/04/23 1011  Resp 12 10/04/23 1011  SpO2 90 % 10/04/23 1011  Vitals shown include unfiled device data.  Last Pain:  Vitals:   10/04/23 1006  TempSrc: Oral  PainSc:          Complications: There were no known notable events for this encounter.

## 2023-10-04 NOTE — Anesthesia Postprocedure Evaluation (Signed)
Anesthesia Post Note  Patient: Jason Floyd  Procedure(s) Performed: ATRIAL FIBRILLATION ABLATION     Patient location during evaluation: PACU Anesthesia Type: General Level of consciousness: awake and alert Pain management: pain level controlled Vital Signs Assessment: post-procedure vital signs reviewed and stable Respiratory status: spontaneous breathing, nonlabored ventilation and respiratory function stable Cardiovascular status: blood pressure returned to baseline and stable Postop Assessment: no apparent nausea or vomiting Anesthetic complications: no   There were no known notable events for this encounter.  Last Vitals:  Vitals:   10/04/23 1200 10/04/23 1207  BP: 92/63 98/75  Pulse: 69 64  Resp: 16 19  Temp:    SpO2: 94% 94%    Last Pain:  Vitals:   10/04/23 1006  TempSrc: Oral  PainSc:                  Collene Schlichter

## 2023-10-04 NOTE — Progress Notes (Signed)
Up and walked and tolerated well; bilat groins stable, no bleeding or hematoma

## 2023-10-04 NOTE — Anesthesia Procedure Notes (Signed)
Procedure Name: Intubation Date/Time: 10/04/2023 7:51 AM  Performed by: Ayesha Rumpf, CRNAPre-anesthesia Checklist: Patient identified, Emergency Drugs available, Suction available and Patient being monitored Patient Re-evaluated:Patient Re-evaluated prior to induction Oxygen Delivery Method: Circle System Utilized Preoxygenation: Pre-oxygenation with 100% oxygen Induction Type: IV induction Ventilation: Mask ventilation without difficulty Laryngoscope Size: Glidescope and 4 Grade View: Grade I Tube type: Oral Tube size: 7.5 mm Number of attempts: 1 Airway Equipment and Method: Stylet and Oral airway Placement Confirmation: ETT inserted through vocal cords under direct vision, positive ETCO2 and breath sounds checked- equal and bilateral Secured at: 23 cm Tube secured with: Tape Dental Injury: Teeth and Oropharynx as per pre-operative assessment

## 2023-10-04 NOTE — H&P (Signed)
Electrophysiology Note:   Date:  10/04/2023  ID:  Jason Floyd, DOB 1966/09/11, MRN [id number]   Primary Cardiologist: None Electrophysiologist: Sherryl Manges, MD       History of Present Illness:   Jason Floyd is a 57 y.o. male with h/o DM, chronic back pain, former smoker/poly substance abuse, NICM that had recovered >> CAD w/ICM s/p ICD (08/2020), chronic CHF, HTN, HLD, PE, DM, hx of ?TIA, atrial fibrillation and VT c/b device shocks who is seen today for routine electrophysiology followup.    Patient was admitted on 07/17/2023 for device shocks.  He had had at least 3 device shocks in the 1 to 2 days leading up to his hospitalization.  Device interrogation showed dual tachycardia with both atrial fibrillation, which began on September 1 and subsequent development of fast ventricular tachycardia.  He had been on amiodarone for many years up with good control, but this was discontinued January 2024 because of his young age and history of hypothyroidism.  Amiodarone was restarted during his hospitalization in September.  Since discharge the patient reports doing relatively well. No known recurrences of atrial fibrillation or VT. No device therapies.  He denies chest pain, palpitations, dyspnea, PND, orthopnea, nausea, vomiting, dizziness, syncope, edema, weight gain, or early satiety.     Interval: Patient reports doing well. He has no new or acute complaints. Presents today for Afib ablation. Had preOp CT which showed a cavitary nodule in the posterior right lower lobe which delayed his originally scheduled ablation last week. Dedicated CT was then performed which recommended repeat study in 3 months. He has no infectious symptoms.    Review of systems complete and found to be negative unless listed in HPI.    EP Information / Studies Reviewed:     EKG is not ordered today. EKG from 07/25/23 reviewed which showed normal sinus rhythm.       Echo 08/15/23:  LV function  moderately decreased.  LVEF 30 to 35%.  Global hypokinesis.  Mild concentric LVH.  Grade 1 diastolic dysfunction. Normal RV size and function. Mildly dilated left atrium.  Right atrium is normal in size. No significant valvular disease.   MRI 08/02/20:  IMPRESSION: 1. Normal LV size with mild LVH. EF 26% with wall motion abnormalities as noted above.   2.  Normal RV size with mildly decreased systolic function, EF 43%.   3. The delayed enhancement pattern is difficult. It could be suggestive of a very large, wrap-around LAD infarction with extensive no-reflow affecting the subendocardium. However, the mid-wall and subepicardial location of the LGE looks more like a viral myocarditis-type picture to me. If it is indeed LAD infarction with extensive no-reflow, the affected segments would have >50% wall thickness no-reflow + LGE, so would be unlikely to improve with revascularization.   Risk Assessment/Calculations:     CHA2DS2-VASc Score =   4  This indicates a  % annual risk of stroke. The patient's score is based upon: CHF History: 1 HTN History: 1 Diabetes History: 1 Vascular Disease History: 1 Age Score: 0 Gender Score: 0               Physical Exam:    Wt Readings from Last 3 Encounters:  10/04/23 104.3 kg  08/30/23 106.1 kg  08/15/23 105.6 kg   Temp Readings from Last 3 Encounters:  10/04/23 (!) 97.4 F (36.3 C) (Temporal)  08/09/23 98.4 F (36.9 C) (Temporal)  07/19/23 98.1 F (36.7 C) (Oral)   BP  Readings from Last 3 Encounters:  10/04/23 99/65  09/06/23 108/73  08/30/23 104/64   Pulse Readings from Last 3 Encounters:  10/04/23 67  09/06/23 69  08/30/23 73     GEN: Well nourished, well developed in no acute distress NECK: No JVD; No carotid bruits CARDIAC: Regular rate and rhythm, no murmurs, rubs, gallops RESPIRATORY:  Normal work of breathing. EXTREMITIES:  No edema; No deformity    ASSESSMENT AND PLAN:   Jason Floyd is a 57 y.o.  male with h/o DM, chronic back pain, former smoker/poly substance abuse, NICM that had recovered >> CAD w/ICM s/p ICD (08/2020), chronic CHF, HTN, HLD, PE, DM, hx of ?TIA, atrial fibrillation and VT c/b device shocks who is seen today for routine electrophysiology followup.    He had a recent hospitalization for ICD shocks.  On interrogation, he had initially developed atrial fibrillation.  After being in atrial fibrillation he then developed ventricular tachycardia for which she received appropriate therapies.   #Persistent atrial fibrillation: It is certainly possible that his atrial fibrillation could have been a trigger for his ventricular tachycardia.  He also has a low LVEF.  For these reasons, I think aggressive rhythm control would be appropriate.  He is on amiodarone; however, would like to eventually retrial transitioning him off of this medication. - Discussed treatment options today for AF including continuing antiarrhythmic drug therapy and ablation. Discussed risks, recovery and likelihood of success with each treatment strategy. Risk, benefits, and alternatives to EP study and ablation for afib were discussed. These risks include but are not limited to stroke, bleeding, vascular damage, tamponade, perforation, damage to the esophagus, lungs, phrenic nerve and other structures, pulmonary vein stenosis, worsening renal function, coronary vasospasm and death.  Discussed potential need for repeat ablation procedures and antiarrhythmic drugs after an initial ablation.  -The patient understands these risk and wishes to proceed with ablation today.   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  However, his cardiac MRI also suggest a component of nonischemic scar. -Continue amiodarone for now. -He is getting close follow-up of his thyroid function with his family medicine provider. -LFTs normal 07/17/23. -No recent PFTs. Will order today.   #Dual chamber  ICD in situ: Device interrogation performed today. Normal device function. Stable/appropriate lead parameters - sensing, threshold, impedance. No programming changes made.One episode of NSVT on 07/26/23. -Continue 3 month follow up. - Will need prophylactic antibiotics for ablation today.   #Secondary hypercoagulable state due to atrial fibrillation:  -Continue Eliquis.    #CAD: No angina. -Continue aspirin, statin, and evolocumab.   #Chronic systolic heart failure: Well compensated on exam today. -He is on an excellent regimen of GDMT. -Continue follow up with Dr. [name].   Signed, Nobie Putnam, MD

## 2023-10-07 ENCOUNTER — Encounter (HOSPITAL_COMMUNITY): Payer: Self-pay | Admitting: Cardiology

## 2023-10-08 ENCOUNTER — Other Ambulatory Visit (HOSPITAL_COMMUNITY): Payer: Self-pay | Admitting: Internal Medicine

## 2023-10-16 ENCOUNTER — Ambulatory Visit (HOSPITAL_COMMUNITY): Payer: BC Managed Care – PPO | Admitting: Physician Assistant

## 2023-10-31 ENCOUNTER — Ambulatory Visit (HOSPITAL_COMMUNITY)
Admission: RE | Admit: 2023-10-31 | Discharge: 2023-10-31 | Disposition: A | Discharge status: Home / Self Care | Payer: BC Managed Care – PPO | Source: Ambulatory Visit | Attending: Surgery | Admitting: Surgery

## 2023-10-31 DIAGNOSIS — M5412 Radiculopathy, cervical region: Secondary | ICD-10-CM | POA: Diagnosis present

## 2023-11-01 ENCOUNTER — Ambulatory Visit (HOSPITAL_COMMUNITY)
Admission: RE | Admit: 2023-11-01 | Discharge: 2023-11-01 | Disposition: A | Discharge status: Home / Self Care | Payer: BC Managed Care – PPO | Source: Ambulatory Visit | Attending: Physician Assistant | Admitting: Physician Assistant

## 2023-11-01 ENCOUNTER — Encounter (HOSPITAL_COMMUNITY): Payer: Self-pay | Admitting: Internal Medicine

## 2023-11-01 ENCOUNTER — Encounter (HOSPITAL_COMMUNITY): Payer: Self-pay | Admitting: Physician Assistant

## 2023-11-01 VITALS — BP 94/68 | HR 83 | Ht 74.0 in | Wt 239.0 lb

## 2023-11-01 DIAGNOSIS — Z79899 Other long term (current) drug therapy: Secondary | ICD-10-CM | POA: Diagnosis not present

## 2023-11-01 DIAGNOSIS — I5022 Chronic systolic (congestive) heart failure: Secondary | ICD-10-CM | POA: Insufficient documentation

## 2023-11-01 DIAGNOSIS — I110 Hypertensive heart disease with heart failure: Secondary | ICD-10-CM | POA: Diagnosis not present

## 2023-11-01 DIAGNOSIS — Z7901 Long term (current) use of anticoagulants: Secondary | ICD-10-CM | POA: Insufficient documentation

## 2023-11-01 DIAGNOSIS — D6869 Other thrombophilia: Secondary | ICD-10-CM | POA: Insufficient documentation

## 2023-11-01 DIAGNOSIS — Z95810 Presence of automatic (implantable) cardiac defibrillator: Secondary | ICD-10-CM | POA: Diagnosis not present

## 2023-11-01 DIAGNOSIS — Z5181 Encounter for therapeutic drug level monitoring: Secondary | ICD-10-CM

## 2023-11-01 DIAGNOSIS — I4819 Other persistent atrial fibrillation: Secondary | ICD-10-CM | POA: Diagnosis not present

## 2023-11-01 DIAGNOSIS — I2510 Atherosclerotic heart disease of native coronary artery without angina pectoris: Secondary | ICD-10-CM | POA: Insufficient documentation

## 2023-11-01 NOTE — Progress Notes (Signed)
Primary Care Physician: Sheliah Hatch, MD Primary Cardiologist: None Electrophysiologist: Sherryl Manges, MD  Jason Floyd: Dr [name]  Referring Physician: Dr Max Sane Jason Floyd is a 57 y.o. male with a history of DM, chronic back pain, former smoker/poly substance abuse, CAD w/ ICM s/p ICD, chronic CHF, HTN, HLD, PE, DM, TIA, VT, atrial fibrillation who presents for follow up in the [facility]. Patient was admitted on 07/17/2023 for device shocks. He had had at least 3 device shocks in the 1 to 2 days leading up to his hospitalization. Device interrogation showed dual tachycardia with both atrial fibrillation, which began on September 1 and subsequent development of fast ventricular tachycardia. He had been on amiodarone for many years up with good control, but this was discontinued January 2024 because of his young age and history of hypothyroidism. Amiodarone was restarted during his hospitalization in September. He was seen by Dr Jimmey Ralph and underwent afib ablation on 10/04/23.  On follow up today, patient reports that he has done well since his ablation. He has not had any interim symptoms of afib, no shocks from his device. He denies chest pain or groin issues.   Today, he denies symptoms of palpitations, chest pain, shortness of breath, orthopnea, PND, lower extremity edema, dizziness, presyncope, syncope, snoring, daytime somnolence, bleeding, or neurologic sequela. The patient is tolerating medications without difficulties and is otherwise without complaint today.    Atrial Fibrillation Risk Factors:  he does not have symptoms or diagnosis of sleep apnea. he does not have a history of rheumatic fever.   Atrial Fibrillation Management history:  Previous antiarrhythmic drugs: amiodarone  Previous cardioversions: 07/18/23 Previous ablations: 10/04/23 Anticoagulation history: Eliquis  ROS- All systems are reviewed and negative except as per the  HPI above.  Past Medical History:  Diagnosis Date   AICD (automatic cardioverter/defibrillator) present    Allergy    Arthritis    Atrial fibrillation (HCC)    CHF (congestive heart failure) (HCC)    CHF (congestive heart failure) (HCC)    Diabetes mellitus without complication (HCC)    Drug abuse (HCC)    Eczema    GERD (gastroesophageal reflux disease)    Gout    Heart attack (HCC) 06/10/2020   History of kidney stones    Hyperlipidemia    Hypertension    MI (myocardial infarction) (HCC)    PE (pulmonary embolism)    TIA (transient ischemic attack)    hx of per pt - pt not aware he had not followed by a neurologist   Ventricular tachycardia (HCC)     Current Outpatient Medications  Medication Sig Dispense Refill   allopurinol (ZYLOPRIM) 300 MG tablet TAKE 2 TABLETS (600 MG TOTAL) BY MOUTH DAILY. 60 tablet 6   amiodarone (PACERONE) 200 MG tablet Take 1 tablet (200 mg total) by mouth daily. 30 tablet 5   aspirin EC 81 MG tablet Take 81 mg by mouth daily. Swallow whole.     atorvastatin (LIPITOR) 80 MG tablet TAKE 1 TABLET BY MOUTH DAILY. 30 tablet 11   calcium carbonate (TUMS - DOSED IN MG ELEMENTAL CALCIUM) 500 MG chewable tablet Chew 2-3 tablets by mouth daily as needed for indigestion or heartburn.     carvedilol (COREG) 3.125 MG tablet Take 1 tablet (3.125 mg total) by mouth 2 (two) times daily. 60 tablet 3   cetirizine (ZYRTEC) 10 MG tablet Take 10 mg by mouth daily.     cyclobenzaprine (FLEXERIL) 10 MG  tablet TAKE 1 TABLET BY MOUTH 3 TIMES DAILY AS NEEDED FOR MUSCLE SPASMS. 45 tablet 1   ELIQUIS 5 MG TABS tablet TAKE 1 TABLET BY MOUTH 2 TIMES A DAY 60 tablet 11   Evolocumab (REPATHA SURECLICK) 140 MG/ML SOAJ Inject 140 mg into the skin every 14 (fourteen) days. 2 mL 11   FARXIGA 10 MG TABS tablet TAKE 1 TABLET BY MOUTH DAILY BEFORE BREAKFAST. 90 tablet 1   fenofibrate 160 MG tablet TAKE 1 TABLET BY MOUTH DAILY. 90 tablet 1   furosemide (LASIX) 20 MG tablet Take 1 tablet  by mouth daily as needed for swelling or a weight gain of 3 pounds or more in 24 hours or 5 pounds in one week. 30 tablet 5   gabapentin (NEURONTIN) 100 MG capsule Take 100 mg by mouth 3 (three) times daily.     Magnesium Oxide 400 MG CAPS Take 1 capsule (400 mg total) by mouth in the morning and at bedtime. 60 capsule 11   methocarbamol (ROBAXIN) 500 MG tablet TAKE 1 TABLET BY MOUTH EVERY 6 HOURS AS NEEDED FOR MUSCLE SPASMS. 90 tablet 0   Naphazoline HCl (CLEAR EYES OP) Place 1 drop into both eyes daily as needed (sore eyes).     pantoprazole (PROTONIX) 40 MG tablet TAKE 1 TABLET BY MOUTH DAILY. 30 tablet 2   potassium chloride SA (KLOR-CON M20) 20 MEQ tablet Take 1 tablet by mouth when taking Lasix 30 tablet 5   sacubitril-valsartan (ENTRESTO) 49-51 MG Take 1 tablet by mouth 2 (two) times daily. 60 tablet 3   Semaglutide, 2 MG/DOSE, (OZEMPIC, 2 MG/DOSE,) 8 MG/3ML SOPN Inject 2 mg into the skin once a week. 3 mL 11   sodium chloride (OCEAN) 0.65 % SOLN nasal spray Place 1 spray into both nostrils as needed (dryness).     spironolactone (ALDACTONE) 25 MG tablet Take 1 tablet (25 mg total) by mouth at bedtime. 30 tablet 6   traZODone (DESYREL) 100 MG tablet TAKE 1 TABLET (100 MG TOTAL) BY MOUTH AT BEDTIME. 90 tablet 1   triamcinolone (KENALOG) 0.025 % ointment Apply 1 Application topically daily as needed (Dry skin).     VASCEPA 1 g capsule TAKE 2 CAPSULES BY MOUTH 2 TIMES DAILY. 120 capsule 3   No current facility-administered medications for this encounter.    Physical Exam: BP 94/68   Pulse 83   Ht 6\' 2"  (1.88 m)   Wt 108.4 kg   BMI 30.69 kg/m   GEN: Well nourished, well developed in no acute distress NECK: No JVD; No carotid bruits CARDIAC: Regular rate and rhythm, no murmurs, rubs, gallops RESPIRATORY:  Clear to auscultation without rales, wheezing or rhonchi  ABDOMEN: Soft, non-tender, non-distended EXTREMITIES:  No edema; No deformity   Wt Readings from Last 3 Encounters:   11/01/23 108.4 kg  10/04/23 104.3 kg  08/30/23 106.1 kg     EKG today demonstrates  SR, 1st degree AV block, LAFB Vent. rate 83 BPM PR interval 254 ms QRS duration 114 ms QT/QTcB 414/486 ms   Echo 08/15/23 demonstrated   1. Left ventricular ejection fraction, by estimation, is 30 to 35%. The  left ventricle has moderately decreased function. The left ventricle  demonstrates global hypokinesis. There is mild concentric left ventricular  hypertrophy. Left ventricular diastolic parameters are consistent with Grade I diastolic dysfunction (impaired relaxation). The average left ventricular global longitudinal strain is -11.8 %. The global longitudinal strain is abnormal.   2. Right ventricular systolic function is  normal. The right ventricular  size is normal.   3. Left atrial size was mildly dilated.   4. The mitral valve is normal in structure. Trivial mitral valve  regurgitation. No evidence of mitral stenosis.   5. The aortic valve is tricuspid. Aortic valve regurgitation is not  visualized. No aortic stenosis is present.   6. The inferior vena cava is normal in size with greater than 50%  respiratory variability, suggesting right atrial pressure of 3 mmHg.    CHA2DS2-VASc Score = 6  The patient's score is based upon: CHF History: 1 HTN History: 1 Diabetes History: 1 Stroke History: 2 (TIA) Vascular Disease History: 1 Age Score: 0 Gender Score: 0       ASSESSMENT AND PLAN: Persistent Atrial Fibrillation (ICD10:  I48.19) The patient's CHA2DS2-VASc score is 6, indicating a 9.7% annual risk of stroke.   S/p afib ablation 10/04/23 Patient appears to be maintaining SR Continue amiodarone 200 mg daily for now Continue carvedilol 3.125 mg BID Continue Eliquis 5 mg BID with no missed doses for 3 months post ablation  Secondary Hypercoagulable State (ICD10:  D68.69) The patient is at significant risk for stroke/thromboembolism based upon his CHA2DS2-VASc Score of 6.   Continue Apixaban (Eliquis).   Chronic HFrEF ICM EF 30-35%, s/p ICD Fluid status appears stable GDMT per [facility]  HTN Stable on current regimen  CAD CAC score 405 No anginal symptoms   Follow up with Dr Jimmey Ralph as scheduled.        Jason Loa PA-C [facility] [address] [place], [place] [address] [phone]

## 2023-11-05 ENCOUNTER — Other Ambulatory Visit: Payer: Self-pay | Admitting: Family Medicine

## 2023-11-05 NOTE — Telephone Encounter (Signed)
Requested Prescriptions   Pending Prescriptions Disp Refills   cyclobenzaprine (FLEXERIL) 10 MG tablet [Pharmacy Med Name: CYCLOBENZAPRINE HCL 10 MG T 10 Tablet] 45 tablet 1    Sig: TAKE 1 TABLET BY MOUTH 3 TIMES DAILY AS NEEDED FOR MUSCLE SPASMS.     Date of patient request: 11/05/2023 Last office visit: 08/09/2023 Upcoming visit: Visit date not found Date of last refill: 07/10/2023 Last refill amount: 45

## 2023-11-07 ENCOUNTER — Telehealth (HOSPITAL_COMMUNITY): Payer: Self-pay | Admitting: *Deleted

## 2023-11-07 NOTE — Telephone Encounter (Signed)
Received call on departmental voicemail from this pt expressing the desire to schedule.  Reviewed medical history.  Called pt back and advised he had been cleared.  Pt will receive phone call on tomorrow 12/27 to schedule.  Would like a call between 8-9 as he will be unavailable to be reached by phone.  Advised of the new year insurance benefits will change in 2025.  Verbalized understanding and plans to participate no matter the cost to him.  Will forward to support staff for contacting him for scheduling.

## 2023-11-08 ENCOUNTER — Telehealth (HOSPITAL_COMMUNITY): Payer: Self-pay

## 2023-11-08 NOTE — Telephone Encounter (Signed)
Called patient to see if he was interested in participating in the Cardiac Rehab Program. Patient stated yes. Patient will come in for orientation on 11/14/23 @ 10:30AM and will attend the 12:30PM exercise class.   Pensions consultant.

## 2023-11-12 ENCOUNTER — Telehealth: Payer: BC Managed Care – PPO | Admitting: Nurse Practitioner

## 2023-11-12 ENCOUNTER — Telehealth (HOSPITAL_COMMUNITY): Payer: Self-pay

## 2023-11-12 DIAGNOSIS — U071 COVID-19: Secondary | ICD-10-CM | POA: Diagnosis not present

## 2023-11-12 MED ORDER — MOLNUPIRAVIR EUA 200MG CAPSULE: 4.0000 | ORAL_CAPSULE | Freq: Two times a day (BID) | ORAL | 0 refills | Status: AC

## 2023-11-12 NOTE — Progress Notes (Signed)
 Virtual Visit Consent   Jason Floyd, you are scheduled for a virtual visit with a [facility] provider today. Just as with appointments in the office, your consent must be obtained to participate. Your consent will be active for this visit and any virtual visit you may have with one of our providers in the next 365 days. If you have a MyChart account, a copy of this consent can be sent to you electronically.  As this is a virtual visit, video technology does not allow for your provider to perform a traditional examination. This may limit your provider's ability to fully assess your condition. If your provider identifies any concerns that need to be evaluated in person or the need to arrange testing (such as labs, EKG, etc.), we will make arrangements to do so. Although advances in technology are sophisticated, we cannot ensure that it will always work on either your end or our end. If the connection with a video visit is poor, the visit may have to be switched to a telephone visit. With either a video or telephone visit, we are not always able to ensure that we have a secure connection.  By engaging in this virtual visit, you consent to the provision of healthcare and authorize for your insurance to be billed (if applicable) for the services provided during this visit. Depending on your insurance coverage, you may receive a charge related to this service.  I need to obtain your verbal consent now. Are you willing to proceed with your visit today? Jason Floyd has provided verbal consent on 11/12/2023 for a virtual visit (video or telephone). Lauraine Kitty, FNP  Date: 11/12/2023 5:33 PM  Virtual Visit via Video Note   I, Lauraine Kitty, connected with  Jason Floyd  ([id number], [birth year]) on 11/12/23 at  5:45 PM EST by a video-enabled telemedicine application and verified that I am speaking with the correct person using two identifiers.  Location: Patient: Virtual Visit  Location Patient: Home Provider: Virtual Visit Location Provider: Home Office   I discussed the limitations of evaluation and management by telemedicine and the availability of in person appointments. The patient expressed understanding and agreed to proceed.    History of Present Illness: Jason Floyd is a 57 y.o. who identifies as a male who was assigned male at birth, and is being seen today regarding COVID symptoms.   His wife currently has COVID  He has had multiple other exposures as well  Patient has current symptoms of sore throat, headache, congestion in sinuses, fatigue with body aches This started yesterday   He has had COVID in the past  Most recently in May - he used an anti-viral at that time     Problems:  Patient Active Problem List   Diagnosis Date Noted   Hypercoagulable state due to persistent atrial fibrillation (HCC) 11/01/2023   Abnormal TSH 08/09/2023   Acute on chronic combined systolic and diastolic CHF (congestive heart failure) (HCC) 08/09/2023   Hypothyroid 07/25/2022   Primary osteoarthritis of right knee 10/03/2021   Primary localized osteoarthritis of right knee 10/03/2021   Ischemic cardiomyopathy 03/09/2021   Persistent atrial fibrillation (HCC) 03/09/2021   ICD (implantable cardioverter-defibrillator) in place - BS 12/05/2020   Coronary artery disease involving native coronary artery of native heart without angina pectoris 09/21/2020   Chronic combined systolic and diastolic congestive heart failure (HCC) 09/21/2020   V-tach (HCC) 09/21/2020   Nasal septal perforation 08/10/2020   TIA (transient ischemic attack) 06/17/2020  GERD (gastroesophageal reflux disease) 08/05/2019   HTN (hypertension) 04/16/2018   Insomnia 01/22/2018   Controlled diabetes mellitus type 2 with complications (HCC) 07/07/2015   Thoracic back pain 04/19/2014   Cervical radiculopathy 08/21/2012   Routine general medical examination at a health care facility  04/17/2012   Hyperlipidemia 04/17/2012   OBESITY 12/07/2009   FASCIITIS, PLANTAR 03/29/2009   PULMONARY EMBOLISM, HX OF 11/18/2008   NEPHROLITHIASIS, HX OF 11/18/2008   DRUG ABUSE, HX OF 11/18/2008   GOUT 03/22/2008   NUMMULAR ECZEMA 03/22/2008    Allergies: No Known Allergies Medications:  Current Outpatient Medications:    allopurinol  (ZYLOPRIM ) 300 MG tablet, TAKE 2 TABLETS (600 MG TOTAL) BY MOUTH DAILY., Disp: 60 tablet, Rfl: 6   amiodarone  (PACERONE ) 200 MG tablet, Take 1 tablet (200 mg total) by mouth daily., Disp: 30 tablet, Rfl: 5   aspirin  EC 81 MG tablet, Take 81 mg by mouth daily. Swallow whole., Disp: , Rfl:    atorvastatin  (LIPITOR) 80 MG tablet, TAKE 1 TABLET BY MOUTH DAILY., Disp: 30 tablet, Rfl: 11   calcium  carbonate (TUMS - DOSED IN MG ELEMENTAL CALCIUM ) 500 MG chewable tablet, Chew 2-3 tablets by mouth daily as needed for indigestion or heartburn., Disp: , Rfl:    carvedilol  (COREG ) 3.125 MG tablet, Take 1 tablet (3.125 mg total) by mouth 2 (two) times daily., Disp: 60 tablet, Rfl: 3   cetirizine (ZYRTEC) 10 MG tablet, Take 10 mg by mouth daily., Disp: , Rfl:    cyclobenzaprine  (FLEXERIL ) 10 MG tablet, TAKE 1 TABLET BY MOUTH 3 TIMES DAILY AS NEEDED FOR MUSCLE SPASMS., Disp: 45 tablet, Rfl: 1   ELIQUIS  5 MG TABS tablet, TAKE 1 TABLET BY MOUTH 2 TIMES A DAY, Disp: 60 tablet, Rfl: 11   Evolocumab  (REPATHA  SURECLICK) 140 MG/ML SOAJ, Inject 140 mg into the skin every 14 (fourteen) days., Disp: 2 mL, Rfl: 11   FARXIGA  10 MG TABS tablet, TAKE 1 TABLET BY MOUTH DAILY BEFORE BREAKFAST., Disp: 90 tablet, Rfl: 1   fenofibrate  160 MG tablet, TAKE 1 TABLET BY MOUTH DAILY., Disp: 90 tablet, Rfl: 1   furosemide  (LASIX ) 20 MG tablet, Take 1 tablet by mouth daily as needed for swelling or a weight gain of 3 pounds or more in 24 hours or 5 pounds in one week., Disp: 30 tablet, Rfl: 5   gabapentin (NEURONTIN) 100 MG capsule, Take 100 mg by mouth 3 (three) times daily., Disp: , Rfl:     Magnesium  Oxide 400 MG CAPS, Take 1 capsule (400 mg total) by mouth in the morning and at bedtime., Disp: 60 capsule, Rfl: 11   methocarbamol  (ROBAXIN ) 500 MG tablet, TAKE 1 TABLET BY MOUTH EVERY 6 HOURS AS NEEDED FOR MUSCLE SPASMS., Disp: 90 tablet, Rfl: 0   Naphazoline HCl (CLEAR EYES OP), Place 1 drop into both eyes daily as needed (sore eyes)., Disp: , Rfl:    pantoprazole  (PROTONIX ) 40 MG tablet, TAKE 1 TABLET BY MOUTH DAILY., Disp: 30 tablet, Rfl: 2   potassium chloride  SA (KLOR-CON  M20) 20 MEQ tablet, Take 1 tablet by mouth when taking Lasix , Disp: 30 tablet, Rfl: 5   sacubitril -valsartan  (ENTRESTO ) 49-51 MG, Take 1 tablet by mouth 2 (two) times daily., Disp: 60 tablet, Rfl: 3   Semaglutide , 2 MG/DOSE, (OZEMPIC , 2 MG/DOSE,) 8 MG/3ML SOPN, Inject 2 mg into the skin once a week., Disp: 3 mL, Rfl: 11   sodium chloride  (OCEAN) 0.65 % SOLN nasal spray, Place 1 spray into both nostrils as needed (dryness).,  Disp: , Rfl:    spironolactone  (ALDACTONE ) 25 MG tablet, Take 1 tablet (25 mg total) by mouth at bedtime., Disp: 30 tablet, Rfl: 6   traZODone  (DESYREL ) 100 MG tablet, TAKE 1 TABLET (100 MG TOTAL) BY MOUTH AT BEDTIME., Disp: 90 tablet, Rfl: 1   triamcinolone  (KENALOG ) 0.025 % ointment, Apply 1 Application topically daily as needed (Dry skin)., Disp: , Rfl:    VASCEPA  1 g capsule, TAKE 2 CAPSULES BY MOUTH 2 TIMES DAILY., Disp: 120 capsule, Rfl: 3  Observations/Objective: Patient is well-developed, well-nourished in no acute distress.  Resting comfortably  at home.  Head is normocephalic, atraumatic.  No labored breathing.  Speech is clear and coherent with logical content.  Patient is alert and oriented at baseline.    Assessment and Plan:  1. COVID-19 (Primary)  - molnupiravir  EUA (LAGEVRIO ) 200 mg CAPS capsule; Take 4 capsules (800 mg total) by mouth 2 (two) times daily for 5 days.  Dispense: 40 capsule; Refill: 0    Take with food continue symptom management as discussed  Follow up  with new/persistent symptoms    Follow Up Instructions: I discussed the assessment and treatment plan with the patient. The patient was provided an opportunity to ask questions and all were answered. The patient agreed with the plan and demonstrated an understanding of the instructions.  A copy of instructions were sent to the patient via MyChart unless otherwise noted below.    The patient was advised to call back or seek an in-person evaluation if the symptoms worsen or if the condition fails to improve as anticipated.    Lauraine Kitty, FNP

## 2023-11-14 ENCOUNTER — Ambulatory Visit (HOSPITAL_COMMUNITY): Payer: BC Managed Care – PPO

## 2023-11-15 ENCOUNTER — Encounter (HOSPITAL_COMMUNITY): Payer: Self-pay

## 2023-11-18 ENCOUNTER — Ambulatory Visit (HOSPITAL_COMMUNITY): Payer: BC Managed Care – PPO

## 2023-11-18 NOTE — Progress Notes (Addendum)
 [facility]ED HF CLINIC NOTE PCP: Mahlon Comer BRAVO, MD EP: Dr. Fernande HF: Dr. Cherrie  CC: HF follow up   HPI: Jason Floyd is a 58 y.o. with a PMH of severe systolic heart failure due to NICM, DM2, former polysubstance and tobacco abuse.    Echo 2008 EF 15% in setting of polysubstance abuse.   Echo 12/2007 EF 55-60%  Echo 10/20 EF 55-60% no RWMA.   On 06/07/20 had TIA-like symptoms with mild aphasia and weakness (dropped a can with left hand). Went to PCP had carotid u/s and echo. Carotid u/s 06/22/20 1-39% bilaterally. Echo 8/21 EF down to 25-30%. Brain MRI/MRA. Normal vasculature.  Left corona radiata diffusion-weighted/FLAIR hyperintensity is nonspecific. Differential includes active demyelinating lesionversus subacute insult.  Underwent R/L cath 07/01/20 LAD 100% prox LCX 20% RCA 10%  EF 25-30%  After cath underwent cMRI for viability  - LVEF 26% - LGE suggestive of a very large, wrap-around LAD infarction with extensive no-reflow affecting the subendocardium.   Zio monitored placed for palpitations in 10/21 and found to have VT. Underwent ICD placement by Dr. Fernande on 08/22/20. Was also found to have volume overload and AF.  Echo 05/29/23 EF 35-40%   Admitted 07/16/23 with ICD shocks found to have AF that degenerated to VT. Started amio. Underwent TEE/DC-CV for AF. Post DC-CV complicated by transient hypotension. TEE EF 25%  Echo 10/24 EF 30-35% (probably closer to 35%).   S/p AF ablation 11/24.  Today he returns for HF follow up. Overall feeling fine. No SOB walking up steps, just bought a new house and doing renovations. Occasional positional dizziness, no falls. Denies palpitations, CP, abnormal bleeding, edema, or PND/Orthopnea. Appetite ok. No fever or chills. Weight at home 230 pounds. Taking all medications, rarely takes lasix . Starting CR in 2 weeks. Owns dog-training/events facility. BP at home 110/75.   Past Medical History:  Diagnosis Date   AICD (automatic  cardioverter/defibrillator) present    Allergy    Arthritis    Atrial fibrillation (HCC)    CHF (congestive heart failure) (HCC)    CHF (congestive heart failure) (HCC)    Diabetes mellitus without complication (HCC)    Drug abuse (HCC)    Eczema    GERD (gastroesophageal reflux disease)    Gout    Heart attack (HCC) 06/10/2020   History of kidney stones    Hyperlipidemia    Hypertension    MI (myocardial infarction) (HCC)    PE (pulmonary embolism)    TIA (transient ischemic attack)    hx of per pt - pt not aware he had not followed by a neurologist   Ventricular tachycardia (HCC)    Current Outpatient Medications  Medication Sig Dispense Refill   allopurinol  (ZYLOPRIM ) 300 MG tablet TAKE 2 TABLETS (600 MG TOTAL) BY MOUTH DAILY. 60 tablet 6   amiodarone  (PACERONE ) 200 MG tablet Take 1 tablet (200 mg total) by mouth daily. 30 tablet 5   aspirin  EC 81 MG tablet Take 81 mg by mouth daily. Swallow whole.     atorvastatin  (LIPITOR) 80 MG tablet TAKE 1 TABLET BY MOUTH DAILY. 30 tablet 11   calcium  carbonate (TUMS - DOSED IN MG ELEMENTAL CALCIUM ) 500 MG chewable tablet Chew 2-3 tablets by mouth daily as needed for indigestion or heartburn.     carvedilol  (COREG ) 3.125 MG tablet Take 1 tablet (3.125 mg total) by mouth 2 (two) times daily. 60 tablet 3   cetirizine (ZYRTEC) 10 MG tablet Take 10 mg by mouth  daily.     cyclobenzaprine  (FLEXERIL ) 10 MG tablet TAKE 1 TABLET BY MOUTH 3 TIMES DAILY AS NEEDED FOR MUSCLE SPASMS. 45 tablet 1   ELIQUIS  5 MG TABS tablet TAKE 1 TABLET BY MOUTH 2 TIMES A DAY 60 tablet 11   Evolocumab  (REPATHA  SURECLICK) 140 MG/ML SOAJ Inject 140 mg into the skin every 14 (fourteen) days. 2 mL 11   FARXIGA  10 MG TABS tablet TAKE 1 TABLET BY MOUTH DAILY BEFORE BREAKFAST. 90 tablet 1   fenofibrate  160 MG tablet TAKE 1 TABLET BY MOUTH DAILY. 90 tablet 1   furosemide  (LASIX ) 20 MG tablet Take 1 tablet by mouth daily as needed for swelling or a weight gain of 3 pounds or more  in 24 hours or 5 pounds in one week. 30 tablet 5   gabapentin (NEURONTIN) 100 MG capsule Take 100 mg by mouth 3 (three) times daily.     Magnesium  Oxide 400 MG CAPS Take 1 capsule (400 mg total) by mouth in the morning and at bedtime. 60 capsule 11   methocarbamol  (ROBAXIN ) 500 MG tablet TAKE 1 TABLET BY MOUTH EVERY 6 HOURS AS NEEDED FOR MUSCLE SPASMS. 90 tablet 0   Naphazoline HCl (CLEAR EYES OP) Place 1 drop into both eyes daily as needed (sore eyes).     pantoprazole  (PROTONIX ) 40 MG tablet TAKE 1 TABLET BY MOUTH DAILY. 30 tablet 2   potassium chloride  SA (KLOR-CON  M20) 20 MEQ tablet Take 1 tablet by mouth when taking Lasix  30 tablet 5   sacubitril -valsartan  (ENTRESTO ) 49-51 MG Take 1 tablet by mouth 2 (two) times daily. 60 tablet 3   Semaglutide , 2 MG/DOSE, (OZEMPIC , 2 MG/DOSE,) 8 MG/3ML SOPN Inject 2 mg into the skin once a week. (Patient taking differently: Inject 2 mg into the skin once a week. Wednesdays) 3 mL 11   sodium chloride  (OCEAN) 0.65 % SOLN nasal spray Place 1 spray into both nostrils as needed (dryness).     spironolactone  (ALDACTONE ) 25 MG tablet Take 1 tablet (25 mg total) by mouth at bedtime. 30 tablet 6   traZODone  (DESYREL ) 100 MG tablet TAKE 1 TABLET (100 MG TOTAL) BY MOUTH AT BEDTIME. 90 tablet 1   triamcinolone  (KENALOG ) 0.025 % ointment Apply 1 Application topically daily as needed (Dry skin).     VASCEPA  1 g capsule TAKE 2 CAPSULES BY MOUTH 2 TIMES DAILY. 120 capsule 3   No current facility-administered medications for this encounter.   No Known Allergies   Social History   Socioeconomic History   Marital status: Married    Spouse name: Jason Floyd   Number of children: Not on file   Years of education: Not on file   Highest education level: Bachelor's degree (e.g., BA, AB, BS)  Occupational History   Not on file  Tobacco Use   Smoking status: Former    Current packs/day: 0.00    Average packs/day: 0.3 packs/day for 31.0 years (7.8 ttl pk-yrs)    Types:  Cigarettes    Quit date: 07/15/2023    Years since quitting: 0.3   Smokeless tobacco: Never   Tobacco comments:    Former smoker 11/01/23  Vaping Use   Vaping status: Never Used  Substance and Sexual Activity   Alcohol use: Yes    Comment: once a month   Drug use: Not Currently    Types: Marijuana    Comment: gummies every days   Sexual activity: Not on file  Other Topics Concern   Not on file  Social History Narrative   Lives with wife   Caffeine- coffee 4 c daily   Social Drivers of Corporate Investment Banker Strain: Low Risk  (07/01/2023)   Overall Financial Resource Strain (CARDIA)    Difficulty of Paying Living Expenses: Not hard at all  Food Insecurity: No Food Insecurity (07/17/2023)   Hunger Vital Sign    Worried About Running Out of Food in the Last Year: Never true    Ran Out of Food in the Last Year: Never true  Transportation Needs: No Transportation Needs (07/17/2023)   PRAPARE - Administrator, Civil Service (Medical): No    Lack of Transportation (Non-Medical): No  Physical Activity: Insufficiently Active (07/01/2023)   Exercise Vital Sign    Days of Exercise per Week: 2 days    Minutes of Exercise per Session: 30 min  Stress: No Stress Concern Present (07/01/2023)   [employer] of Occupational Health - Occupational Stress Questionnaire    Feeling of Stress : Only a little  Social Connections: Moderately Isolated (07/01/2023)   Social Connection and Isolation Panel [NHANES]    Frequency of Communication with Friends and Family: Once a week    Frequency of Social Gatherings with Friends and Family: Once a week    Attends Religious Services: Never    Database Administrator or Organizations: Yes    Attends Engineer, Structural: More than 4 times per year    Marital Status: Married  Catering Manager Violence: Not At Risk (07/17/2023)   Humiliation, Afraid, Rape, and Kick questionnaire    Fear of Current or Ex-Partner: No    Emotionally  Abused: No    Physically Abused: No    Sexually Abused: No    Family History  Problem Relation Age of Onset   Dementia Mother        frontal-temporal   Stroke Mother    Breast cancer Maternal Aunt    Other Brother        overdose   Colon cancer Neg Hx    Wt Readings from Last 3 Encounters:  11/20/23 106.1 kg (234 lb)  11/01/23 108.4 kg (239 lb)  10/04/23 104.3 kg (230 lb)   BP 104/84   Pulse 80   Ht 6' 2 (1.88 m)   Wt 106.1 kg (234 lb)   SpO2 100%   BMI 30.04 kg/m   PHYSICAL EXAM: General:  NAD. No resp difficulty, walked into clinic HEENT: Normal Neck: Supple. No JVD. Carotids 2+ bilat; no bruits. No lymphadenopathy or thryomegaly appreciated. Cor: PMI nondisplaced. Regular rate & rhythm. No rubs, gallops or murmurs. Lungs: Clear, faint wheeze upper lobes Abdomen: Soft, nontender, nondistended. No hepatosplenomegaly. No bruits or masses. Good bowel sounds. Extremities: No cyanosis, clubbing, rash, edema Neuro: Alert & oriented x 3, cranial nerves grossly intact. Moves all 4 extremities w/o difficulty. Affect pleasant.  ICD interrogated (personally reviewed): HL Score 3, thoracic impedence wobbles but most recently stable, average HR 74 bpm, 1.3 hr/day activity, no AF/AT  ECG (personally reviewed): SB 1AVB, 58 bpm  ASSESSMENT & PLAN: Chronic Systolic HF due to iCM - Echo 7991 EF 15% in setting of polysubstance abuse.  - Echo 2/09 EF 55-60% - Echo 10/20 EF 55-60% no RWMA.  - Echo 8/21 EF 25-30% - Cath 8/21 LAD 100% LCx 20% RCA 10% - cMRI 9/21 EF 26% Large anterior infarct - ICD placed 10/21 for VT - Echo 12/21 EF 30-35% - Echo 6/22 EF 40-45%  - Echo  6/23 EF 40-45% (read as 35-40%)  - Echo 7/24 EF 35-40%  - TEE 9/24 EF 25% in setting of AF/VT - Echo 10/24 EF 30-35% (probably closer to 35%).  - Improved NYHA I-II. Volume stable on PRN lasix  - Continue carvedilol  3.125 mg bid - Continue Entresto  49/51 bid. - Continue Farxiga  10 mg daily. - Continue spiro 25 mg  daily. - Referred to cardiac rehab. Started in 2 weeks - Labs today.  2. CAD - cath as above with subacute LAD infarct and CTO LAD - No s/s angina - Continue ASA, statin.  - Recent LDL was 15 in 7/24.  - Followed by Lipid clinic  3. VT - s/p ICD 10/21 - s/p ICD shock in 9/24 - Continue amio 200 daily, EP following - Keep K. 4.0 Mg > 2.0  4. Persistent AF - s/p AF ablation 11/24. - Remains in NSR today and on ICD - Continue Eliquis  5 mg bid. No bleeding.  5. Tobacco use - Has been quit since 9/204. - Congratulated. - Discussed need to stay quit to permit advanced therapies if needed in future  6. DM2 - Continue SGLT2i, metformin , GLP1-RA  7. Hx Possible TIA - Carotid dopplers ok - Brain MRI/MRA. Vasculature ok. Findings suspicious for cardio-embolism.   8. Hypothyroidism - Per PCP. Watch TFTs now that he is back on amio - Now he is mildly hyperthyroid.  - PCP has referred to Endo  9. Obesity - Body mass index is 30.04 kg/m. - Continue GLP1-RA  10. Lung nodule - incidental finding on CT chest - Has been referred to [facility]  Doing well! Follow up in 4 months with Dr. Cherrie  Jason CHRISTELLA Gainer, FNP  1:29 PM

## 2023-11-19 ENCOUNTER — Other Ambulatory Visit: Payer: Self-pay | Admitting: Family Medicine

## 2023-11-19 ENCOUNTER — Other Ambulatory Visit: Payer: Self-pay

## 2023-11-20 ENCOUNTER — Encounter (HOSPITAL_COMMUNITY): Payer: Self-pay

## 2023-11-20 ENCOUNTER — Ambulatory Visit (HOSPITAL_COMMUNITY)
Admission: RE | Admit: 2023-11-20 | Discharge: 2023-11-20 | Disposition: A | Discharge status: Home / Self Care | Payer: 59 | Source: Ambulatory Visit | Attending: Cardiology | Admitting: Cardiology

## 2023-11-20 VITALS — BP 104/84 | HR 80 | Ht 74.0 in | Wt 234.0 lb

## 2023-11-20 DIAGNOSIS — I428 Other cardiomyopathies: Secondary | ICD-10-CM | POA: Insufficient documentation

## 2023-11-20 DIAGNOSIS — E039 Hypothyroidism, unspecified: Secondary | ICD-10-CM | POA: Diagnosis not present

## 2023-11-20 DIAGNOSIS — F1910 Other psychoactive substance abuse, uncomplicated: Secondary | ICD-10-CM | POA: Insufficient documentation

## 2023-11-20 DIAGNOSIS — E669 Obesity, unspecified: Secondary | ICD-10-CM

## 2023-11-20 DIAGNOSIS — I472 Ventricular tachycardia, unspecified: Secondary | ICD-10-CM

## 2023-11-20 DIAGNOSIS — Z95810 Presence of automatic (implantable) cardiac defibrillator: Secondary | ICD-10-CM | POA: Insufficient documentation

## 2023-11-20 DIAGNOSIS — Z87891 Personal history of nicotine dependence: Secondary | ICD-10-CM

## 2023-11-20 DIAGNOSIS — R911 Solitary pulmonary nodule: Secondary | ICD-10-CM

## 2023-11-20 DIAGNOSIS — I2510 Atherosclerotic heart disease of native coronary artery without angina pectoris: Secondary | ICD-10-CM

## 2023-11-20 DIAGNOSIS — Z6830 Body mass index (BMI) 30.0-30.9, adult: Secondary | ICD-10-CM | POA: Diagnosis not present

## 2023-11-20 DIAGNOSIS — I4819 Other persistent atrial fibrillation: Secondary | ICD-10-CM | POA: Diagnosis not present

## 2023-11-20 DIAGNOSIS — I5022 Chronic systolic (congestive) heart failure: Secondary | ICD-10-CM

## 2023-11-20 DIAGNOSIS — I480 Paroxysmal atrial fibrillation: Secondary | ICD-10-CM | POA: Diagnosis not present

## 2023-11-20 DIAGNOSIS — Z7984 Long term (current) use of oral hypoglycemic drugs: Secondary | ICD-10-CM | POA: Insufficient documentation

## 2023-11-20 DIAGNOSIS — E119 Type 2 diabetes mellitus without complications: Secondary | ICD-10-CM | POA: Diagnosis present

## 2023-11-20 LAB — CUP PACEART REMOTE DEVICE CHECK
Battery Remaining Longevity: 126 mo
Battery Remaining Percentage: 100 %
Brady Statistic RA Percent Paced: 0 %
Brady Statistic RV Percent Paced: 0 %
Date Time Interrogation Session: 20250108131900
HighPow Impedance: 92 Ohm
Implantable Lead Connection Status: 753985
Implantable Lead Connection Status: 753985
Implantable Lead Implant Date: 20211011
Implantable Lead Implant Date: 20211011
Implantable Lead Location: 753859
Implantable Lead Location: 753860
Implantable Lead Model: 138
Implantable Lead Model: 7841
Implantable Lead Serial Number: 1097338
Implantable Lead Serial Number: 303261
Implantable Pulse Generator Implant Date: 20211011
Lead Channel Impedance Value: 665 Ohm
Lead Channel Impedance Value: 731 Ohm
Lead Channel Setting Pacing Amplitude: 2.5 V
Lead Channel Setting Pacing Amplitude: 2.5 V
Lead Channel Setting Pacing Pulse Width: 0.4 ms
Lead Channel Setting Sensing Sensitivity: 0.5 mV
Pulse Gen Serial Number: 228262
Zone Setting Status: 755011

## 2023-11-20 LAB — BASIC METABOLIC PANEL
Anion gap: 9 (ref 5–15)
BUN: 21 mg/dL — ABNORMAL HIGH (ref 6–20)
CO2: 26 mmol/L (ref 22–32)
Calcium: 9.6 mg/dL (ref 8.9–10.3)
Chloride: 102 mmol/L (ref 98–111)
Creatinine, Ser: 1.4 mg/dL — ABNORMAL HIGH (ref 0.61–1.24)
GFR, Estimated: 58 mL/min — ABNORMAL LOW (ref >60)
Glucose, Bld: 137 mg/dL — ABNORMAL HIGH (ref 70–99)
Potassium: 4.4 mmol/L (ref 3.5–5.1)
Sodium: 137 mmol/L (ref 135–145)

## 2023-11-20 NOTE — Patient Instructions (Signed)
 No change in medications. Labs today - will call you if abnormal. Return to see Dr. [name] in 4 months. Please call [phone] in April to schedule this appointment. Please call us if any questions or concerns prior to your next visit.

## 2023-11-21 ENCOUNTER — Ambulatory Visit (INDEPENDENT_AMBULATORY_CARE_PROVIDER_SITE_OTHER): Payer: 59

## 2023-11-21 DIAGNOSIS — I255 Ischemic cardiomyopathy: Secondary | ICD-10-CM

## 2023-11-22 ENCOUNTER — Ambulatory Visit (HOSPITAL_COMMUNITY): Payer: BC Managed Care – PPO

## 2023-11-25 ENCOUNTER — Ambulatory Visit (HOSPITAL_COMMUNITY): Payer: BC Managed Care – PPO

## 2023-11-27 ENCOUNTER — Ambulatory Visit (HOSPITAL_COMMUNITY): Payer: BC Managed Care – PPO

## 2023-11-28 ENCOUNTER — Telehealth (HOSPITAL_COMMUNITY): Payer: Self-pay

## 2023-11-29 ENCOUNTER — Ambulatory Visit (HOSPITAL_COMMUNITY): Payer: BC Managed Care – PPO

## 2023-12-02 ENCOUNTER — Ambulatory Visit (HOSPITAL_COMMUNITY): Payer: BC Managed Care – PPO

## 2023-12-02 ENCOUNTER — Other Ambulatory Visit (HOSPITAL_COMMUNITY): Payer: Self-pay | Admitting: Internal Medicine

## 2023-12-04 ENCOUNTER — Ambulatory Visit (HOSPITAL_COMMUNITY): Payer: BC Managed Care – PPO

## 2023-12-04 ENCOUNTER — Encounter (HOSPITAL_COMMUNITY)
Admission: RE | Admit: 2023-12-04 | Discharge: 2023-12-04 | Disposition: A | Discharge status: Home / Self Care | Payer: 59 | Source: Ambulatory Visit | Attending: Internal Medicine | Admitting: Internal Medicine

## 2023-12-04 VITALS — BP 104/62 | HR 72 | Ht 74.0 in | Wt 236.6 lb

## 2023-12-04 DIAGNOSIS — I5022 Chronic systolic (congestive) heart failure: Secondary | ICD-10-CM | POA: Insufficient documentation

## 2023-12-04 LAB — GLUCOSE, CAPILLARY: Glucose-Capillary: 164 mg/dL — ABNORMAL HIGH (ref 70–99)

## 2023-12-04 NOTE — Progress Notes (Signed)
Cardiac Individual Treatment Plan  Patient Details  Name: Jason Floyd MRN: [id number] Date of Birth: February 17, 1966 Referring Provider:   Flowsheet Row INTENSIVE CARDIAC REHAB ORIENT from 12/04/2023 in [facility]  Referring Provider Arvilla Meres, MD       Initial Encounter Date:  Flowsheet Row INTENSIVE CARDIAC REHAB ORIENT from 12/04/2023 in [facility]  Date 12/04/23       Visit Diagnosis: Heart failure, chronic systolic (HCC)  Patient's Home Medications on Admission:  Current Outpatient Medications:    allopurinol (ZYLOPRIM) 300 MG tablet, TAKE 2 TABLETS (600 MG TOTAL) BY MOUTH DAILY., Disp: 60 tablet, Rfl: 6   amiodarone (PACERONE) 200 MG tablet, Take 1 tablet (200 mg total) by mouth daily., Disp: 30 tablet, Rfl: 5   aspirin EC 81 MG tablet, Take 81 mg by mouth daily. Swallow whole., Disp: , Rfl:    atorvastatin (LIPITOR) 80 MG tablet, TAKE 1 TABLET BY MOUTH DAILY., Disp: 30 tablet, Rfl: 11   calcium carbonate (TUMS - DOSED IN MG ELEMENTAL CALCIUM) 500 MG chewable tablet, Chew 2-3 tablets by mouth daily as needed for indigestion or heartburn., Disp: , Rfl:    carvedilol (COREG) 3.125 MG tablet, TAKE 1 TABLET BY MOUTH 2 TIMES DAILY., Disp: 180 tablet, Rfl: 3   cetirizine (ZYRTEC) 10 MG tablet, Take 10 mg by mouth daily., Disp: , Rfl:    cyclobenzaprine (FLEXERIL) 10 MG tablet, TAKE 1 TABLET BY MOUTH 3 TIMES DAILY AS NEEDED FOR MUSCLE SPASMS., Disp: 45 tablet, Rfl: 1   ELIQUIS 5 MG TABS tablet, TAKE 1 TABLET BY MOUTH 2 TIMES A DAY, Disp: 60 tablet, Rfl: 11   Evolocumab (REPATHA SURECLICK) 140 MG/ML SOAJ, Inject 140 mg into the skin every 14 (fourteen) days., Disp: 2 mL, Rfl: 11   FARXIGA 10 MG TABS tablet, TAKE 1 TABLET BY MOUTH DAILY BEFORE BREAKFAST., Disp: 90 tablet, Rfl: 1   fenofibrate 160 MG tablet, TAKE 1 TABLET BY MOUTH DAILY., Disp: 90 tablet,  Rfl: 1   furosemide (LASIX) 20 MG tablet, Take 1 tablet by mouth daily as needed for swelling or a weight gain of 3 pounds or more in 24 hours or 5 pounds in one week., Disp: 30 tablet, Rfl: 5   gabapentin (NEURONTIN) 100 MG capsule, Take 100 mg by mouth 3 (three) times daily., Disp: , Rfl:    Magnesium Oxide 400 MG CAPS, Take 1 capsule (400 mg total) by mouth in the morning and at bedtime., Disp: 60 capsule, Rfl: 11   methocarbamol (ROBAXIN) 500 MG tablet, TAKE 1 TABLET BY MOUTH EVERY 6 HOURS AS NEEDED FOR MUSCLE SPASMS., Disp: 90 tablet, Rfl: 0   Naphazoline HCl (CLEAR EYES OP), Place 1 drop into both eyes daily as needed (sore eyes)., Disp: , Rfl:    pantoprazole (PROTONIX) 40 MG tablet, TAKE 1 TABLET BY MOUTH DAILY., Disp: 30 tablet, Rfl: 2   potassium chloride SA (KLOR-CON M20) 20 MEQ tablet, Take 1 tablet by mouth when taking Lasix, Disp: 30 tablet, Rfl: 5   sacubitril-valsartan (ENTRESTO) 49-51 MG, Take 1 tablet by mouth 2 (two) times daily., Disp: 60 tablet, Rfl: 3   Semaglutide, 2 MG/DOSE, (OZEMPIC, 2 MG/DOSE,) 8 MG/3ML SOPN, Inject 2 mg into the skin once a week. (Patient taking differently: Inject 2 mg into the skin once a week. Wednesdays), Disp: 3 mL, Rfl: 11   sodium chloride (OCEAN) 0.65 % SOLN nasal  spray, Place 1 spray into both nostrils as needed (dryness)., Disp: , Rfl:    spironolactone (ALDACTONE) 25 MG tablet, Take 1 tablet (25 mg total) by mouth at bedtime., Disp: 30 tablet, Rfl: 6   traZODone (DESYREL) 100 MG tablet, TAKE 1 TABLET (100 MG TOTAL) BY MOUTH AT BEDTIME., Disp: 90 tablet, Rfl: 1   triamcinolone (KENALOG) 0.025 % ointment, Apply 1 Application topically daily as needed (Dry skin)., Disp: , Rfl:    VASCEPA 1 g capsule, TAKE 2 CAPSULES BY MOUTH 2 TIMES DAILY., Disp: 120 capsule, Rfl: 3  Past Medical History: Past Medical History:  Diagnosis Date   AICD (automatic cardioverter/defibrillator) present    Allergy    Arthritis    Atrial fibrillation (HCC)    CHF  (congestive heart failure) (HCC)    CHF (congestive heart failure) (HCC)    Diabetes mellitus without complication (HCC)    Drug abuse (HCC)    Eczema    GERD (gastroesophageal reflux disease)    Gout    Heart attack (HCC) 06/10/2020   History of kidney stones    Hyperlipidemia    Hypertension    MI (myocardial infarction) (HCC)    PE (pulmonary embolism)    TIA (transient ischemic attack)    hx of per pt - pt not aware he had not followed by a neurologist   Ventricular tachycardia (HCC)     Tobacco Use: Social History   Tobacco Use  Smoking Status Former   Current packs/day: 0.00   Average packs/day: 0.3 packs/day for 31.0 years (7.8 ttl pk-yrs)   Types: Cigarettes   Quit date: 07/15/2023   Years since quitting: 0.3  Smokeless Tobacco Never  Tobacco Comments   Former smoker 11/01/23    Labs: Review Flowsheet  More data exists      Latest Ref Rng & Units 01/23/2022 06/07/2022 07/25/2022 05/29/2023 07/05/2023  Labs for ITP Cardiac and Pulmonary Rehab  Cholestrol 0 - 200 mg/dL 88  - 87  88  -  LDL (calc) 0 - 99 mg/dL 9  - 13  15  -  HDL-C >47 mg/dL 82.95  - 62.13  37  -  Trlycerides <150 mg/dL 086.5  - 784.6  962  -  Hemoglobin A1c 4.6 - 6.5 % 7.3  7.0  - - 8.5     Capillary Blood Glucose: Lab Results  Component Value Date   GLUCAP 164 (H) 12/04/2023   GLUCAP 150 (H) 10/04/2023   GLUCAP 181 (H) 07/19/2023   GLUCAP 224 (H) 07/19/2023   GLUCAP 155 (H) 07/19/2023     Exercise Target Goals: Exercise Program Goal: Individual exercise prescription set using results from initial 6 min walk test and THRR while considering  patient's activity barriers and safety.   Exercise Prescription Goal: Initial exercise prescription builds to 30-45 minutes a day of aerobic activity, 2-3 days per week.  Home exercise guidelines will be given to patient during program as part of exercise prescription that the participant will acknowledge.  Activity Barriers & Risk Stratification:   Activity Barriers & Cardiac Risk Stratification - 12/04/23 1411       Activity Barriers & Cardiac Risk Stratification   Activity Barriers Right Knee Replacement;Decreased Ventricular Function;Deconditioning;Neck/Spine Problems    Cardiac Risk Stratification High   <5 METs on            6 Minute Walk:  6 Minute Walk     Row Name 12/04/23 [phone]  6 Minute Walk   Phase Initial     Distance 1620 feet     Walk Time 6 minutes     # of Rest Breaks 0     MPH 3.07     METS 3.94     RPE 9     Perceived Dyspnea  0     VO2 Peak 13.8     Symptoms Yes (comment)     Comments shin tightness- resolved with rest     Resting HR 72 bpm     Resting BP 104/62     Resting Oxygen Saturation  98 %     Exercise Oxygen Saturation  during 6 min walk 98 %     Max Ex. HR 107 bpm     Max Ex. BP 120/70     2 Minute Post BP 108/72              Oxygen Initial Assessment:   Oxygen Re-Evaluation:   Oxygen Discharge (Final Oxygen Re-Evaluation):   Initial Exercise Prescription:  Initial Exercise Prescription - 12/04/23 1500       Date of Initial Exercise RX and Referring Provider   Date 12/04/23    Referring Provider Arvilla Meres, MD    Expected Discharge Date 02/26/24      Treadmill   MPH 2.8    Grade 0    Minutes 15    METs 3.5      Bike   Level 1    Watts 60    Minutes 15    METs 3      Prescription Details   Frequency (times per week) 3    Duration Progress to 30 minutes of continuous aerobic without signs/symptoms of physical distress      Intensity   THRR 40-80% of Max Heartrate 65-130    Ratings of Perceived Exertion 11-13    Perceived Dyspnea 0-4      Progression   Progression Continue progressive overload as per policy without signs/symptoms or physical distress.      Resistance Training   Training Prescription Yes    Weight 4    Reps 10-15             Perform Capillary Blood Glucose checks as needed.  Exercise Prescription  Changes:   Exercise Comments:   Exercise Goals and Review:   Exercise Goals     Row Name 12/04/23 1411             Exercise Goals   Increase Physical Activity Yes       Intervention Develop an individualized exercise prescription for aerobic and resistive training based on initial evaluation findings, risk stratification, comorbidities and participant's personal goals.;Provide advice, education, support and counseling about physical activity/exercise needs.       Expected Outcomes Short Term: Attend rehab on a regular basis to increase amount of physical activity.;Long Term: Exercising regularly at least 3-5 days a week.;Long Term: Add in home exercise to make exercise part of routine and to increase amount of physical activity.       Increase Strength and Stamina Yes       Intervention Provide advice, education, support and counseling about physical activity/exercise needs.;Develop an individualized exercise prescription for aerobic and resistive training based on initial evaluation findings, risk stratification, comorbidities and participant's personal goals.       Expected Outcomes Short Term: Increase workloads from initial exercise prescription for resistance, speed, and METs.;Short Term: Perform resistance training exercises routinely during rehab and  add in resistance training at home;Long Term: Improve cardiorespiratory fitness, muscular endurance and strength as measured by increased METs and functional capacity ( )       Able to understand and use rate of perceived exertion (RPE) scale Yes       Intervention Provide education and explanation on how to use RPE scale       Expected Outcomes Short Term: Able to use RPE daily in rehab to express subjective intensity level;Long Term:  Able to use RPE to guide intensity level when exercising independently       Knowledge and understanding of Target Heart Rate Range (THRR) Yes       Intervention Provide education and explanation of  THRR including how the numbers were predicted and where they are located for reference       Expected Outcomes Short Term: Able to state/look up THRR;Short Term: Able to use daily as guideline for intensity in rehab;Long Term: Able to use THRR to govern intensity when exercising independently       Understanding of Exercise Prescription Yes       Intervention Provide education, explanation, and written materials on patient's individual exercise prescription       Expected Outcomes Short Term: Able to explain program exercise prescription;Long Term: Able to explain home exercise prescription to exercise independently                Exercise Goals Re-Evaluation :   Discharge Exercise Prescription (Final Exercise Prescription Changes):   Nutrition:  Target Goals: Understanding of nutrition guidelines, daily intake of sodium 1500mg , cholesterol 200mg , calories 30% from fat and 7% or less from saturated fats, daily to have 5 or more servings of fruits and vegetables.  Biometrics:  Pre Biometrics - 12/04/23 1407       Pre Biometrics   Waist Circumference 48 inches    Hip Circumference 45 inches    Waist to Hip Ratio 1.07 %    Triceps Skinfold 24 mm    % Body Fat 33.3 %    Grip Strength 42 kg    Flexibility 13.25 in    Single Leg Stand 25 seconds              Nutrition Therapy Plan and Nutrition Goals:   Nutrition Assessments:  MEDIFICTS Score Key: >=70 Need to make dietary changes  40-70 Heart Healthy Diet <= 40 Therapeutic Level Cholesterol Diet    Picture Your Plate Scores: <19 Unhealthy dietary pattern with much room for improvement. 41-50 Dietary pattern unlikely to meet recommendations for good health and room for improvement. 51-60 More healthful dietary pattern, with some room for improvement.  >60 Healthy dietary pattern, although there may be some specific behaviors that could be improved.    Nutrition Goals Re-Evaluation:   Nutrition Goals  Re-Evaluation:   Nutrition Goals Discharge (Final Nutrition Goals Re-Evaluation):   Psychosocial: Target Goals: Acknowledge presence or absence of significant depression and/or stress, maximize coping skills, provide positive support system. Participant is able to verbalize types and ability to use techniques and skills needed for reducing stress and depression.  Initial Review & Psychosocial Screening:  Initial Psych Review & Screening - 12/04/23 1412       Initial Review   Current issues with None Identified      Family Dynamics   Good Support System? Yes   Wife for support     Barriers   Psychosocial barriers to participate in program There are no identifiable barriers or psychosocial needs.  Screening Interventions   Interventions Provide feedback about the scores to participant;Encouraged to exercise    Expected Outcomes Long Term goal: The participant improves quality of Life and PHQ9 Scores as seen by post scores and/or verbalization of changes;Short Term goal: Identification and review with participant of any Quality of Life or Depression concerns found by scoring the questionnaire.             Quality of Life Scores:  Quality of Life - 12/04/23 1520       Quality of Life   Select Quality of Life      Quality of Life Scores   Health/Function Pre 18.97 %    Socioeconomic Pre 20.5 %    Psych/Spiritual Pre 20.25 %    Family Pre 22.5 %    GLOBAL Pre 19.98 %            Scores of 19 and below usually indicate a poorer quality of life in these areas.  A difference of  2-3 points is a clinically meaningful difference.  A difference of 2-3 points in the total score of the Quality of Life Index has been associated with significant improvement in overall quality of life, self-image, physical symptoms, and general health in studies assessing change in quality of life.  PHQ-9: Review Flowsheet  More data exists      12/04/2023 07/05/2023 07/25/2022 01/23/2022  09/15/2021  Depression screen PHQ 2/9  Decreased Interest 0 0 0 0 0  Down, Depressed, Hopeless 0 0 1 0 0  PHQ - 2 Score 0 0 1 0 0  Altered sleeping 0 1 0 1 0  Tired, decreased energy 1 1 0 0 0  Change in appetite 1 1 1 1  0  Feeling bad or failure about yourself  0 0 0 0 0  Trouble concentrating 0 0 0 1 0  Moving slowly or fidgety/restless 0 0 0 0 0  Suicidal thoughts 0 0 0 0 0  PHQ-9 Score 2 3 2 3  0  Difficult doing work/chores Not difficult at all Not difficult at all Not difficult at all Not difficult at all Not difficult at all   Interpretation of Total Score  Total Score Depression Severity:  1-4 = Minimal depression, 5-9 = Mild depression, 10-14 = Moderate depression, 15-19 = Moderately severe depression, 20-27 = Severe depression   Psychosocial Evaluation and Intervention:   Psychosocial Re-Evaluation:   Psychosocial Discharge (Final Psychosocial Re-Evaluation):   Vocational Rehabilitation: Provide vocational rehab assistance to qualifying candidates.   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 Participant will state understanding/return demonstration of topics presented.     Core Videos: Exercise    Move It!  Clinical staff conducted group or individual video education with verbal and written material and guidebook.  Patient learns the recommended Pritikin exercise program. Exercise with the goal of living a long, healthy life. Some of the health benefits of exercise include controlled diabetes, healthier blood pressure levels, improved cholesterol levels, improved heart and lung capacity, improved sleep, and better body composition. Everyone should speak  with their doctor before starting or changing an exercise routine.  Biomechanical Limitations Clinical staff conducted group  or individual video education with verbal and written material and guidebook.  Patient learns how biomechanical limitations can impact exercise and how we can mitigate and possibly overcome limitations to have an impactful and balanced exercise routine.  Body Composition Clinical staff conducted group or individual video education with verbal and written material and guidebook.  Patient learns that body composition (ratio of muscle mass to fat mass) is a key component to assessing overall fitness, rather than body weight alone. Increased fat mass, especially visceral belly fat, can put [place] at increased risk for metabolic syndrome, type 2 diabetes, heart disease, and even death. It is recommended to combine diet and exercise (cardiovascular and resistance training) to improve your body composition. Seek guidance from your physician and exercise physiologist before implementing an exercise routine.  Exercise Action Plan Clinical staff conducted group or individual video education with verbal and written material and guidebook.  Patient learns the recommended strategies to achieve and enjoy long-term exercise adherence, including variety, self-motivation, self-efficacy, and positive decision making. Benefits of exercise include fitness, good health, weight management, more energy, better sleep, less stress, and overall well-being.  Medical   Heart Disease Risk Reduction Clinical staff conducted group or individual video education with verbal and written material and guidebook.  Patient learns our heart is our most vital organ as it circulates oxygen, nutrients, white blood cells, and hormones throughout the entire body, and carries waste away. Data supports a plant-based eating plan like the Pritikin Program for its effectiveness in slowing progression of and reversing heart  disease. The video provides a number of recommendations to address heart disease.   Metabolic Syndrome and Belly Fat  Clinical staff conducted group or individual video education with verbal and written material and guidebook.  Patient learns what metabolic syndrome is, how it leads to heart disease, and how one can reverse it and keep it from coming back. You have metabolic syndrome if you have 3 of the following 5 criteria: abdominal obesity, high blood pressure, high triglycerides, low HDL cholesterol, and high blood sugar.  Hypertension and Heart Disease Clinical staff conducted group or individual video education with verbal and written material and guidebook.  Patient learns that high blood pressure, or hypertension, is very common in the [place]. Hypertension is largely due to excessive salt intake, but other important risk factors include being overweight, physical inactivity, drinking too much alcohol, smoking, and not eating enough potassium from fruits and vegetables. High blood pressure is a leading risk factor for heart attack, stroke, congestive heart failure, dementia, kidney failure, and premature death. Long-term effects of excessive salt intake include stiffening of the arteries and thickening of heart muscle and organ damage. Recommendations include ways to reduce hypertension and the risk of heart disease.  Diseases of Our Time - Focusing on Diabetes Clinical staff conducted group or individual video education with verbal and written material and guidebook.  Patient learns why the best way to stop diseases of our time is prevention, through food and other lifestyle changes. Medicine (such as prescription pills and surgeries) is often only a Band-Aid on the problem, not a long-term solution. Most common diseases of our time include obesity, type 2 diabetes, hypertension, heart disease, and cancer. The Pritikin Program is recommended and has been proven to help reduce, reverse,  and/or prevent the damaging effects of metabolic syndrome.  Nutrition   Overview of the Pritikin Eating Plan  Clinical staff conducted group or individual video education with verbal and written material and guidebook.  Patient learns about the Pritikin Eating Plan for disease risk reduction. The Pritikin Eating Plan emphasizes a wide variety of unrefined, minimally-processed carbohydrates, like fruits, vegetables, whole grains, and legumes. Go, Caution, and Stop food choices are explained. Plant-based and lean animal proteins are emphasized. Rationale provided for low sodium intake for blood pressure control, low added sugars for blood sugar stabilization, and low added fats and oils for coronary artery disease risk reduction and weight management.  Calorie Density  Clinical staff conducted group or individual video education with verbal and written material and guidebook.  Patient learns about calorie density and how it impacts the Pritikin Eating Plan. Knowing the characteristics of the food you choose will help you decide whether those foods will lead to weight gain or weight loss, and whether you want to consume more or less of them. Weight loss is usually a side effect of the Pritikin Eating Plan because of its focus on low calorie-dense foods.  Label Reading  Clinical staff conducted group or individual video education with verbal and written material and guidebook.  Patient learns about the Pritikin recommended label reading guidelines and corresponding recommendations regarding calorie density, added sugars, sodium content, and whole grains.  Dining Out - Part 1  Clinical staff conducted group or individual video education with verbal and written material and guidebook.  Patient learns that restaurant meals can be sabotaging because they can be so high in calories, fat, sodium, and/or sugar. Patient learns recommended strategies on how to positively address this and avoid unhealthy  pitfalls.  Facts on Fats  Clinical staff conducted group or individual video education with verbal and written material and guidebook.  Patient learns that lifestyle modifications can be just as effective, if not more so, as many medications for lowering your risk of heart disease. A Pritikin lifestyle can help to reduce your risk of inflammation and atherosclerosis (cholesterol build-up, or plaque, in the artery walls). Lifestyle interventions such as dietary choices and physical activity address the cause of atherosclerosis. A review of the types of fats and their impact on blood cholesterol levels, along with dietary recommendations to reduce fat intake is also included.  Nutrition Action Plan  Clinical staff conducted group or individual video education with verbal and written material and guidebook.  Patient learns how to incorporate Pritikin recommendations into their lifestyle. Recommendations include planning and keeping personal health goals in mind as an important part of their success.  Healthy Mind-Set    Healthy Minds, Bodies, Hearts  Clinical staff conducted group or individual video education with verbal and written material and guidebook.  Patient learns how to identify when they are stressed. Video will discuss the impact of that stress, as well as the many benefits of stress management. Patient will also be introduced to stress management techniques. The way we think, act, and feel has an impact on our hearts.  How Our Thoughts Can Heal Our Hearts  Clinical staff conducted group or individual video education with verbal and written material and guidebook.  Patient learns that negative thoughts can cause depression and anxiety. This can result in negative lifestyle behavior and serious health problems. Cognitive behavioral therapy is an effective method to help control our thoughts in order to change and improve our emotional outlook.  Additional Videos:  Exercise    Improving  Performance  Clinical staff conducted group or individual video education with verbal and written material and guidebook.  Patient learns to use a non-linear approach by alternating intensity levels and lengths  of time spent exercising to help burn more calories and lose more body fat. Cardiovascular exercise helps improve heart health, metabolism, hormonal balance, blood sugar control, and recovery from fatigue. Resistance training improves strength, endurance, balance, coordination, reaction time, metabolism, and muscle mass. Flexibility exercise improves circulation, posture, and balance. Seek guidance from your physician and exercise physiologist before implementing an exercise routine and learn your capabilities and proper form for all exercise.  Introduction to Yoga  Clinical staff conducted group or individual video education with verbal and written material and guidebook.  Patient learns about yoga, a discipline of the coming together of mind, breath, and body. The benefits of yoga include improved flexibility, improved range of motion, better posture and core strength, increased lung function, weight loss, and positive self-image. Yoga's heart health benefits include lowered blood pressure, healthier heart rate, decreased cholesterol and triglyceride levels, improved immune function, and reduced stress. Seek guidance from your physician and exercise physiologist before implementing an exercise routine and learn your capabilities and proper form for all exercise.  Medical   Aging: Enhancing Your Quality of Life  Clinical staff conducted group or individual video education with verbal and written material and guidebook.  Patient learns key strategies and recommendations to stay in good physical health and enhance quality of life, such as prevention strategies, having an advocate, securing a Health Care Proxy and Power of Attorney, and keeping a list of medications and system for tracking them. It  also discusses how to avoid risk for bone loss.  Biology of Weight Control  Clinical staff conducted group or individual video education with verbal and written material and guidebook.  Patient learns that weight gain occurs because we consume more calories than we burn (eating more, moving less). Even if your body weight is normal, you may have higher ratios of fat compared to muscle mass. Too much body fat puts you at increased risk for cardiovascular disease, heart attack, stroke, type 2 diabetes, and obesity-related cancers. In addition to exercise, following the Pritikin Eating Plan can help reduce your risk.  Decoding Lab Results  Clinical staff conducted group or individual video education with verbal and written material and guidebook.  Patient learns that lab test reflects one measurement whose values change over time and are influenced by many factors, including medication, stress, sleep, exercise, food, hydration, pre-existing medical conditions, and more. It is recommended to use the knowledge from this video to become more involved with your lab results and evaluate your numbers to speak with your doctor.   Diseases of Our Time - Overview  Clinical staff conducted group or individual video education with verbal and written material and guidebook.  Patient learns that according to the CDC, 50% to 70% of chronic diseases (such as obesity, type 2 diabetes, elevated lipids, hypertension, and heart disease) are avoidable through lifestyle improvements including healthier food choices, listening to satiety cues, and increased physical activity.  Sleep Disorders Clinical staff conducted group or individual video education with verbal and written material and guidebook.  Patient learns how good quality and duration of sleep are important to overall health and well-being. Patient also learns about sleep disorders and how they impact health along with recommendations to address them, including  discussing with a physician.  Nutrition  Dining Out - Part 2 Clinical staff conducted group or individual video education with verbal and written material and guidebook.  Patient learns how to plan ahead and communicate in order to maximize their dining experience in a healthy and  nutritious manner. Included are recommended food choices based on the type of restaurant the patient is visiting.   Fueling a Banker conducted group or individual video education with verbal and written material and guidebook.  There is a strong connection between our food choices and our health. Diseases like obesity and type 2 diabetes are very prevalent and are in large-part due to lifestyle choices. The Pritikin Eating Plan provides plenty of food and hunger-curbing satisfaction. It is easy to follow, affordable, and helps reduce health risks.  Menu Workshop  Clinical staff conducted group or individual video education with verbal and written material and guidebook.  Patient learns that restaurant meals can sabotage health goals because they are often packed with calories, fat, sodium, and sugar. Recommendations include strategies to plan ahead and to communicate with the manager, chef, or server to help order a healthier meal.  Planning Your Eating Strategy  Clinical staff conducted group or individual video education with verbal and written material and guidebook.  Patient learns about the Pritikin Eating Plan and its benefit of reducing the risk of disease. The Pritikin Eating Plan does not focus on calories. Instead, it emphasizes high-quality, nutrient-rich foods. By knowing the characteristics of the foods, we choose, we can determine their calorie density and make informed decisions.  Targeting Your Nutrition Priorities  Clinical staff conducted group or individual video education with verbal and written material and guidebook.  Patient learns that lifestyle habits have a tremendous  impact on disease risk and progression. This video provides eating and physical activity recommendations based on your personal health goals, such as reducing LDL cholesterol, losing weight, preventing or controlling type 2 diabetes, and reducing high blood pressure.  Vitamins and Minerals  Clinical staff conducted group or individual video education with verbal and written material and guidebook.  Patient learns different ways to obtain key vitamins and minerals, including through a recommended healthy diet. It is important to discuss all supplements you take with your doctor.   Healthy Mind-Set    Smoking Cessation  Clinical staff conducted group or individual video education with verbal and written material and guidebook.  Patient learns that cigarette smoking and tobacco addiction pose a serious health risk which affects millions of people. Stopping smoking will significantly reduce the risk of heart disease, lung disease, and many forms of cancer. Recommended strategies for quitting are covered, including working with your doctor to develop a successful plan.  Culinary   Becoming a Set designer conducted group or individual video education with verbal and written material and guidebook.  Patient learns that cooking at home can be healthy, cost-effective, quick, and puts them in control. Keys to cooking healthy recipes will include looking at your recipe, assessing your equipment needs, planning ahead, making it simple, choosing cost-effective seasonal ingredients, and limiting the use of added fats, salts, and sugars.  Cooking - Breakfast and Snacks  Clinical staff conducted group or individual video education with verbal and written material and guidebook.  Patient learns how important breakfast is to satiety and nutrition through the entire day. Recommendations include key foods to eat during breakfast to help stabilize blood sugar levels and to prevent overeating at meals  later in the day. Planning ahead is also a key component.  Cooking - Educational psychologist conducted group or individual video education with verbal and written material and guidebook.  Patient learns eating strategies to improve overall health, including an approach to cook  more at home. Recommendations include thinking of animal protein as a side on your plate rather than center stage and focusing instead on lower calorie dense options like vegetables, fruits, whole grains, and plant-based proteins, such as beans. Making sauces in large quantities to freeze for later and leaving the skin on your vegetables are also recommended to maximize your experience.  Cooking - Healthy Salads and Dressing Clinical staff conducted group or individual video education with verbal and written material and guidebook.  Patient learns that vegetables, fruits, whole grains, and legumes are the foundations of the Pritikin Eating Plan. Recommendations include how to incorporate each of these in flavorful and healthy salads, and how to create homemade salad dressings. Proper handling of ingredients is also covered. Cooking - Soups and [employer] - Soups and Desserts Clinical staff conducted group or individual video education with verbal and written material and guidebook.  Patient learns that Pritikin soups and desserts make for easy, nutritious, and delicious snacks and meal components that are low in sodium, fat, sugar, and calorie density, while high in vitamins, minerals, and filling fiber. Recommendations include simple and healthy ideas for soups and desserts.   Overview     The Pritikin Solution Program Overview Clinical staff conducted group or individual video education with verbal and written material and guidebook.  Patient learns that the results of the Pritikin Program have been documented in more than 100 articles published in peer-reviewed journals, and the benefits include reducing  risk factors for (and, in some cases, even reversing) high cholesterol, high blood pressure, type 2 diabetes, obesity, and more! An overview of the three key pillars of the Pritikin Program will be covered: eating well, doing regular exercise, and having a healthy mind-set.  WORKSHOPS  Exercise: Exercise Basics: Building Your Action Plan Clinical staff led group instruction and group discussion with PowerPoint presentation and patient guidebook. To enhance the learning environment the use of posters, models and videos may be added. At the conclusion of this workshop, patients will comprehend the difference between physical activity and exercise, as well as the benefits of incorporating both, into their routine. Patients will understand the FITT (Frequency, Intensity, Time, and Type) principle and how to use it to build an exercise action plan. In addition, safety concerns and other considerations for exercise and cardiac rehab will be addressed by the presenter. The purpose of this lesson is to promote a comprehensive and effective weekly exercise routine in order to improve patients' overall level of fitness.   Managing Heart Disease: Your Path to a Healthier Heart Clinical staff led group instruction and group discussion with PowerPoint presentation and patient guidebook. To enhance the learning environment the use of posters, models and videos may be added.At the conclusion of this workshop, patients will understand the anatomy and physiology of the heart. Additionally, they will understand how Pritikin's three pillars impact the risk factors, the progression, and the management of heart disease.  The purpose of this lesson is to provide a high-level overview of the heart, heart disease, and how the Pritikin lifestyle positively impacts risk factors.  Exercise Biomechanics Clinical staff led group instruction and group discussion with PowerPoint presentation and patient guidebook. To enhance  the learning environment the use of posters, models and videos may be added. Patients will learn how the structural parts of their bodies function and how these functions impact their daily activities, movement, and exercise. Patients will learn how to promote a neutral spine, learn how to manage pain, and  identify ways to improve their physical movement in order to promote healthy living. The purpose of this lesson is to expose patients to common physical limitations that impact physical activity. Participants will learn practical ways to adapt and manage aches and pains, and to minimize their effect on regular exercise. Patients will learn how to maintain good posture while sitting, walking, and lifting.  Balance Training and Fall Prevention  Clinical staff led group instruction and group discussion with PowerPoint presentation and patient guidebook. To enhance the learning environment the use of posters, models and videos may be added. At the conclusion of this workshop, patients will understand the importance of their sensorimotor skills (vision, proprioception, and the vestibular system) in maintaining their ability to balance as they age. Patients will apply a variety of balancing exercises that are appropriate for their current level of function. Patients will understand the common causes for poor balance, possible solutions to these problems, and ways to modify their physical environment in order to minimize their fall risk. The purpose of this lesson is to teach patients about the importance of maintaining balance as they age and ways to minimize their risk of falling.  WORKSHOPS   Nutrition:  Fueling a Ship broker led group instruction and group discussion with PowerPoint presentation and patient guidebook. To enhance the learning environment the use of posters, models and videos may be added. Patients will review the foundational principles of the Pritikin Eating Plan  and understand what constitutes a serving size in each of the food groups. Patients will also learn Pritikin-friendly foods that are better choices when away from home and review make-ahead meal and snack options. Calorie density will be reviewed and applied to three nutrition priorities: weight maintenance, weight loss, and weight gain. The purpose of this lesson is to reinforce (in a group setting) the key concepts around what patients are recommended to eat and how to apply these guidelines when away from home by planning and selecting Pritikin-friendly options. Patients will understand how calorie density may be adjusted for different weight management goals.  Mindful Eating  Clinical staff led group instruction and group discussion with PowerPoint presentation and patient guidebook. To enhance the learning environment the use of posters, models and videos may be added. Patients will briefly review the concepts of the Pritikin Eating Plan and the importance of low-calorie dense foods. The concept of mindful eating will be introduced as well as the importance of paying attention to internal hunger signals. Triggers for non-hunger eating and techniques for dealing with triggers will be explored. The purpose of this lesson is to provide patients with the opportunity to review the basic principles of the Pritikin Eating Plan, discuss the value of eating mindfully and how to measure internal cues of hunger and fullness using the Hunger Scale. Patients will also discuss reasons for non-hunger eating and learn strategies to use for controlling emotional eating.  Targeting Your Nutrition Priorities Clinical staff led group instruction and group discussion with PowerPoint presentation and patient guidebook. To enhance the learning environment the use of posters, models and videos may be added. Patients will learn how to determine their genetic susceptibility to disease by reviewing their family history. Patients  will gain insight into the importance of diet as part of an overall healthy lifestyle in mitigating the impact of genetics and other environmental insults. The purpose of this lesson is to provide patients with the opportunity to assess their personal nutrition priorities by looking at their family history, their  own health history and current risk factors. Patients will also be able to discuss ways of prioritizing and modifying the Pritikin Eating Plan for their highest risk areas  Menu  Clinical staff led group instruction and group discussion with PowerPoint presentation and patient guidebook. To enhance the learning environment the use of posters, models and videos may be added. Using menus brought in from [employer], or printed from [employer], patients will apply the Pritikin dining out guidelines that were presented in the [employer] video. Patients will also be able to practice these guidelines in a variety of provided scenarios. The purpose of this lesson is to provide patients with the opportunity to practice hands-on learning of the Pritikin Dining Out guidelines with actual menus and practice scenarios.  Label Reading Clinical staff led group instruction and group discussion with PowerPoint presentation and patient guidebook. To enhance the learning environment the use of posters, models and videos may be added. Patients will review and discuss the Pritikin label reading guidelines presented in Pritikin's Label Reading Educational series video. Using fool labels brought in from local grocery stores and markets, patients will apply the label reading guidelines and determine if the packaged food meet the Pritikin guidelines. The purpose of this lesson is to provide patients with the opportunity to review, discuss, and practice hands-on learning of the Pritikin Label Reading guidelines with actual packaged food labels. Cooking School  Pritikin's [employer]  are designed to teach patients ways to prepare quick, simple, and affordable recipes at home. The importance of nutrition's role in chronic disease risk reduction is reflected in its emphasis in the overall Pritikin program. By learning how to prepare essential core Pritikin Eating Plan recipes, patients will increase control over what they eat; be able to customize the flavor of foods without the use of added salt, sugar, or fat; and improve the quality of the food they consume. By learning a set of core recipes which are easily assembled, quickly prepared, and affordable, patients are more likely to prepare more healthy foods at home. These workshops focus on convenient breakfasts, simple entres, side dishes, and desserts which can be prepared with minimal effort and are consistent with nutrition recommendations for cardiovascular risk reduction. Cooking [employer] are taught by a Armed forces logistics/support/administrative officer (RD) who has been trained by the [employer]. The chef or RD has a clear understanding of the importance of minimizing - if not completely eliminating - added fat, sugar, and sodium in recipes. Throughout the series of Cooking School Workshop sessions, patients will learn about healthy ingredients and efficient methods of cooking to build confidence in their capability to prepare    Cooking School weekly topics:  Adding Flavor- Sodium-Free  Fast and Healthy Breakfasts  Powerhouse Plant-Based Proteins  Satisfying Salads and Dressings  Simple Sides and Sauces  International Cuisine-Spotlight on the [employer] Zones  Delicious Desserts  Savory Soups  [employer] - Meals in a Astronomer Appetizers and Snacks  Comforting Weekend Breakfasts  One-Pot Wonders   Fast Evening Meals  Landscape architect Your Pritikin Plate  WORKSHOPS   Healthy Mindset (Psychosocial):  Focused Goals, Sustainable Changes Clinical staff led group instruction and group discussion  with PowerPoint presentation and patient guidebook. To enhance the learning environment the use of posters, models and videos may be added. Patients will be able to apply effective goal setting strategies to establish at least one personal goal, and then take consistent, meaningful action  toward that goal. They will learn to identify common barriers to achieving personal goals and develop strategies to overcome them. Patients will also gain an understanding of how our mind-set can impact our ability to achieve goals and the importance of cultivating a positive and growth-oriented mind-set. The purpose of this lesson is to provide patients with a deeper understanding of how to set and achieve personal goals, as well as the tools and strategies needed to overcome common obstacles which may arise along the way.  From Head to Heart: The Power of a Healthy Outlook  Clinical staff led group instruction and group discussion with PowerPoint presentation and patient guidebook. To enhance the learning environment the use of posters, models and videos may be added. Patients will be able to recognize and describe the impact of emotions and mood on physical health. They will discover the importance of self-care and explore self-care practices which may work for them. Patients will also learn how to utilize the 4 C's to cultivate a healthier outlook and better manage stress and challenges. The purpose of this lesson is to demonstrate to patients how a healthy outlook is an essential part of maintaining good health, especially as they continue their cardiac rehab journey.  Healthy Sleep for a Healthy Heart Clinical staff led group instruction and group discussion with PowerPoint presentation and patient guidebook. To enhance the learning environment the use of posters, models and videos may be added. At the conclusion of this workshop, patients will be able to demonstrate knowledge of the importance of sleep to overall  health, well-being, and quality of life. They will understand the symptoms of, and treatments for, common sleep disorders. Patients will also be able to identify daytime and nighttime behaviors which impact sleep, and they will be able to apply these tools to help manage sleep-related challenges. The purpose of this lesson is to provide patients with a general overview of sleep and outline the importance of quality sleep. Patients will learn about a few of the most common sleep disorders. Patients will also be introduced to the concept of "sleep hygiene," and discover ways to self-manage certain sleeping problems through simple daily behavior changes. Finally, the workshop will motivate patients by clarifying the links between quality sleep and their goals of heart-healthy living.   Recognizing and Reducing Stress Clinical staff led group instruction and group discussion with PowerPoint presentation and patient guidebook. To enhance the learning environment the use of posters, models and videos may be added. At the conclusion of this workshop, patients will be able to understand the types of stress reactions, differentiate between acute and chronic stress, and recognize the impact that chronic stress has on their health. They will also be able to apply different coping mechanisms, such as reframing negative self-talk. Patients will have the opportunity to practice a variety of stress management techniques, such as deep abdominal breathing, progressive muscle relaxation, and/or guided imagery.  The purpose of this lesson is to educate patients on the role of stress in their lives and to provide healthy techniques for coping with it.  Learning Barriers/Preferences:  Learning Barriers/Preferences - 12/04/23 1412       Learning Barriers/Preferences   Learning Barriers Sight   reading glasses   Learning Preferences Audio;Computer/Internet;Group Instruction;Individual Instruction;Pictoral;Skilled  Demonstration;Verbal Instruction;Video;Written Material             Education Topics:  Knowledge Questionnaire Score:  Knowledge Questionnaire Score - 12/04/23 1412       Knowledge Questionnaire Score   Pre  Score 24/24             Core Components/Risk Factors/Patient Goals at Admission:  Personal Goals and Risk Factors at Admission - 12/04/23 1413       Core Components/Risk Factors/Patient Goals on Admission    Weight Management Yes;Obesity;Weight Loss    Intervention Weight Management: Develop a combined nutrition and exercise program designed to reach desired caloric intake, while maintaining appropriate intake of nutrient and fiber, sodium and fats, and appropriate energy expenditure required for the weight goal.;Weight Management: Provide education and appropriate resources to help participant work on and attain dietary goals.;Weight Management/Obesity: Establish reasonable short term and long term weight goals.;Obesity: Provide education and appropriate resources to help participant work on and attain dietary goals.    Expected Outcomes Short Term: Continue to assess and modify interventions until short term weight is achieved;Long Term: Adherence to nutrition and physical activity/exercise program aimed toward attainment of established weight goal;Weight Loss: Understanding of general recommendations for a balanced deficit meal plan, which promotes 1-2 lb weight loss per week and includes a negative energy balance of [phone] kcal/d;Understanding recommendations for meals to include 15-35% energy as protein, 25-35% energy from fat, 35-60% energy from carbohydrates, less than 200mg  of dietary cholesterol, 20-35 gm of total fiber daily;Understanding of distribution of calorie intake throughout the day with the consumption of 4-5 meals/snacks    Diabetes Yes    Intervention Provide education about signs/symptoms and action to take for hypo/hyperglycemia.;Provide education about  proper nutrition, including hydration, and aerobic/resistive exercise prescription along with prescribed medications to achieve blood glucose in normal ranges: Fasting glucose 65-99 mg/dL    Expected Outcomes Short Term: Participant verbalizes understanding of the signs/symptoms and immediate care of hyper/hypoglycemia, proper foot care and importance of medication, aerobic/resistive exercise and nutrition plan for blood glucose control.;Long Term: Attainment of HbA1C < 7%.    Heart Failure Yes    Intervention Provide a combined exercise and nutrition program that is supplemented with education, support and counseling about heart failure. Directed toward relieving symptoms such as shortness of breath, decreased exercise tolerance, and extremity edema.    Expected Outcomes Improve functional capacity of life;Short term: Attendance in program 2-3 days a week with increased exercise capacity. Reported lower sodium intake. Reported increased fruit and vegetable intake. Reports medication compliance.;Short term: Daily weights obtained and reported for increase. Utilizing diuretic protocols set by physician.;Long term: Adoption of self-care skills and reduction of barriers for early signs and symptoms recognition and intervention leading to self-care maintenance.    Hypertension Yes    Intervention Provide education on lifestyle modifcations including regular physical activity/exercise, weight management, moderate sodium restriction and increased consumption of fresh fruit, vegetables, and low fat dairy, alcohol moderation, and smoking cessation.;Monitor prescription use compliance.    Expected Outcomes Short Term: Continued assessment and intervention until BP is < 140/29mm HG in hypertensive participants. < 130/60mm HG in hypertensive participants with diabetes, heart failure or chronic kidney disease.;Long Term: Maintenance of blood pressure at goal levels.    Lipids Yes    Intervention Provide education and  support for participant on nutrition & aerobic/resistive exercise along with prescribed medications to achieve LDL 70mg , HDL >40mg .    Expected Outcomes Short Term: Participant states understanding of desired cholesterol values and is compliant with medications prescribed. Participant is following exercise prescription and nutrition guidelines.;Long Term: Cholesterol controlled with medications as prescribed, with individualized exercise RX and with personalized nutrition plan. Value goals: LDL < 70mg , HDL > 40 mg.  Core Components/Risk Factors/Patient Goals Review:    Core Components/Risk Factors/Patient Goals at Discharge (Final Review):    ITP Comments:  ITP Comments     Row Name 12/04/23 1407           ITP Comments Dr. Armanda Magic medical director. Introduction to [facility] education/intensive cardiac rehab. Initial orientation packet reviewed with patient.                Comments: Participant attended orientation for the cardiac rehabilitation program on  12/04/2023  to perform initial intake and exercise walk test. Patient introduced to the [facility] education and orientation packet was reviewed. Completed 6-minute walk test, measurements, initial ITP, and exercise prescription. Vital signs stable. Telemetry-normal sinus rhythm PVCs, asymptomatic.   Service time was from 1320 to 1452.  [name], MS, ACSM-CEP 12/04/2023 3:27 PM

## 2023-12-04 NOTE — Progress Notes (Signed)
Cardiac Rehab Medication Review   Does the patient  feel that his/her medications are working for him/her?  YES  Has the patient been experiencing any side effects to the medications prescribed?  YES   Does the patient measure his/her own blood pressure or blood glucose at home?  YES   Does the patient have any problems obtaining medications due to transportation or finances?   NO  Understanding of regimen: good Understanding of indications: good Potential of compliance: excellent    Comments: Jason Floyd has a good understanding of his medications and regime. He has a BP cuff at home and checks regularly, but not glucose meter. He says that since adding carvedilol he has had some dizziness associated with low BP when bending over.     [name], MS, ACSM-CEP 12/04/2023 2:08 PM

## 2023-12-06 ENCOUNTER — Ambulatory Visit (HOSPITAL_COMMUNITY): Payer: BC Managed Care – PPO

## 2023-12-09 ENCOUNTER — Encounter (HOSPITAL_COMMUNITY)
Admission: RE | Admit: 2023-12-09 | Discharge: 2023-12-09 | Disposition: A | Discharge status: Home / Self Care | Payer: 59 | Source: Ambulatory Visit | Attending: Internal Medicine | Admitting: Internal Medicine

## 2023-12-09 ENCOUNTER — Ambulatory Visit (HOSPITAL_COMMUNITY): Payer: BC Managed Care – PPO

## 2023-12-09 DIAGNOSIS — I5022 Chronic systolic (congestive) heart failure: Secondary | ICD-10-CM

## 2023-12-09 LAB — GLUCOSE, CAPILLARY
Glucose-Capillary: 157 mg/dL — ABNORMAL HIGH (ref 70–99)
Glucose-Capillary: 142 mg/dL — ABNORMAL HIGH (ref 70–99)

## 2023-12-09 NOTE — Progress Notes (Signed)
Daily Session Note  Patient Details  Name: Jason Floyd MRN: [id number] Date of Birth: 1966/07/24 Referring Provider:   Flowsheet Row INTENSIVE CARDIAC REHAB ORIENT from 12/04/2023 in [facility]  Referring Provider Jason Meres, MD       Encounter Date: 12/09/2023  Check In:  Session Check In - 12/09/23 1247       Check-In   Supervising physician immediately available to respond to emergencies [facility] - Physician supervision    Physician(s) Jason Newcomer PA-C    Location [facility]    Staff Present Jason Picket, MS, ACSM-CEP, CCRP, Exercise Physiologist;Jason Peggye Pitt, MS, ACSM-CEP, Exercise Physiologist;Jason Floyd BS, ACSM-CEP, Exercise Physiologist;Jason Igoe, RN, BSN    Virtual Visit No    Medication changes reported     No    Fall or balance concerns reported    No    Tobacco Cessation No Change    Warm-up and Cool-down Performed as group-led instruction   CRP2 orientation   Resistance Training Performed Yes    VAD Patient? No    PAD/SET Patient? No      Pain Assessment   Currently in Pain? No/denies    Pain Score 0-No pain    Multiple Pain Sites No             Capillary Blood Glucose: Results for orders placed or performed during the hospital encounter of 12/04/23 (from the past 24 hours)  Glucose, capillary     Status: Abnormal   Collection Time: 12/09/23 12:32 PM  Result Value Ref Range   Glucose-Capillary 157 (H) 70 - 99 mg/dL  Glucose, capillary     Status: Abnormal   Collection Time: 12/09/23  1:41 PM  Result Value Ref Range   Glucose-Capillary 142 (H) 70 - 99 mg/dL     Exercise Prescription Changes - 12/09/23 1400       Response to Exercise   Blood Pressure (Admit) 104/60    Blood Pressure (Exercise) 130/78    Blood Pressure (Exit) 105/70    Heart Rate (Admit) 79 bpm    Heart Rate (Exercise) 107 bpm    Heart Rate (Exit) 83 bpm    Rating of  Perceived Exertion (Exercise) 11.5    Symptoms None    Comments Pt's first day in the CRP2 program    Duration Continue with 30 min of aerobic exercise without signs/symptoms of physical distress.    Intensity THRR unchanged      Progression   Progression Continue to progress workloads to maintain intensity without signs/symptoms of physical distress.    Average METs 2.75      Resistance Training   Training Prescription Yes    Weight 4    Reps 10-15    Time 10 Minutes      Interval Training   Interval Training No      Treadmill   MPH 2.8    Grade 0    Minutes 15    METs 3.14      Bike   Level 1    Watts 17    Minutes 15    METs 2.4             Social History   Tobacco Use  Smoking Status Former   Current packs/day: 0.00   Average packs/day: 0.3 packs/day for 31.0 years (7.8 ttl pk-yrs)   Types: Cigarettes   Quit date: 07/15/2023   Years since quitting: 0.4  Smokeless Tobacco  Never  Tobacco Comments   Former smoker 11/01/23    Goals Met:  Exercise tolerated well No report of concerns or symptoms today Strength training completed today  Goals Unmet:  Not Applicable  Comments: Pt started cardiac rehab today.  Pt tolerated light exercise without difficulty. VSS, telemetry-Sinus Rhythm, occasional PVC, asymptomatic.  Medication list reconciled. Pt denies barriers to medicaiton compliance.  PSYCHOSOCIAL ASSESSMENT:  PHQ-2. Pt exhibits positive coping skills, hopeful outlook with supportive family. Jason Floyd says that he has low energy at times. Jason Floyd says he has a variation in his appetite at times due to taking ozempic. Jason Floyd does not check his CBG's at home.  No psychosocial needs identified at this time, no psychosocial interventions necessary.    Pt enjoys spending time with his dogs, playing the bass guitars, watching TV, cooking and cleaning..   Pt oriented to exercise equipment and routine.    Understanding verbalized. Jason Headings RN BSN    Dr.  Armanda Floyd is Medical Director for Cardiac Rehab at [facility].

## 2023-12-11 ENCOUNTER — Ambulatory Visit (HOSPITAL_COMMUNITY): Payer: BC Managed Care – PPO

## 2023-12-11 ENCOUNTER — Encounter (HOSPITAL_COMMUNITY)
Admission: RE | Admit: 2023-12-11 | Discharge: 2023-12-11 | Discharge status: Home / Self Care | Payer: 59 | Source: Ambulatory Visit | Attending: Internal Medicine

## 2023-12-11 DIAGNOSIS — I5022 Chronic systolic (congestive) heart failure: Secondary | ICD-10-CM

## 2023-12-11 LAB — GLUCOSE, CAPILLARY
Glucose-Capillary: 134 mg/dL — ABNORMAL HIGH (ref 70–99)
Glucose-Capillary: 151 mg/dL — ABNORMAL HIGH (ref 70–99)

## 2023-12-12 ENCOUNTER — Encounter: Payer: Self-pay | Admitting: Emergency Medicine

## 2023-12-12 ENCOUNTER — Ambulatory Visit: Payer: 59 | Admitting: Emergency Medicine

## 2023-12-12 VITALS — BP 99/68 | HR 84 | Ht 74.0 in | Wt 236.0 lb

## 2023-12-12 DIAGNOSIS — R918 Other nonspecific abnormal finding of lung field: Secondary | ICD-10-CM | POA: Diagnosis not present

## 2023-12-12 DIAGNOSIS — R911 Solitary pulmonary nodule: Secondary | ICD-10-CM

## 2023-12-12 NOTE — Patient Instructions (Addendum)
VISIT SUMMARY:  You are a 58 year old male with a history of atrial fibrillation, hypertension, congestive heart failure, diabetes, coronary artery disease with a history of myocardial infarction, and pulmonary embolism. You came in today to discuss an abnormal CT scan of your chest. The scan showed two small nodules in your right lung, which have not changed since your last scan. You have no symptoms related to these nodules. We also reviewed your ongoing management for atrial fibrillation and eczema.  YOUR PLAN:  -PULMONARY NODULES: Pulmonary nodules are small growths in the lungs that can be caused by various factors, including infections, inflammation, or, less commonly, cancer. Your CT scan showed two small nodules in your right lung that have not changed since the last scan. We will repeat the CT scan at the end of February 2025 to check for any changes. If the nodules change, we may need to do further tests, such as a bronchoscopy.   Please schedule a follow-up CT chest scan for the end of February 2025 to monitor the pulmonary nodules. Continue with your current anticoagulation therapy for atrial fibrillation as advised by your cardiologist. Maintain your current eczema management routine.

## 2023-12-12 NOTE — Assessment & Plan Note (Addendum)
Pulmonary Nodules Two small hazy nodules in the right lung of unclear significance identified on CT cardiac morphology study on 09/06/2023 and unchanged on follow-up CT chest on 09/25/2023. No associated symptoms. Discussed potential causes including benign findings, infection, inflammation, autoimmune diseases, and malignancy. Low pre-test suspicion for cancerous process but nodules are large enough to warrant follow-up. -Order repeat CT chest at end of February 2025 to assess for any changes in nodules. -Plan to review results and discuss further evaluation including potential bronchoscopy if nodules evolve.

## 2023-12-12 NOTE — Progress Notes (Signed)
Subjective:    Patient ID: Jason Floyd, male    DOB: 1966/09/12, 58 y.o.   MRN: [id number]  HPI The patient is a 58 year old male with atrial fibrillation post ablation, hypertension, CHF with an AICD in place, diabetes, CAD with a history of MI, and pulmonary embolism who presents with an abnormal CT scan of the chest. He was referred for evaluation of an abnormal CT scan of the chest.  He underwent a CT cardiac morphology study on September 06, 2023, which revealed mild diffuse bronchial wall thickening and a cavitary 8x5 mm posterior basilar right lower lobe pulmonary nodule. Additionally, a 17x9 mm subsolid nodule was noted in the superior segment of the right lower lobe. A follow-up CT chest on September 25, 2023, showed both nodules were still present and unchanged. The initial CT was performed in preparation for an atrial ablation. He has no symptoms such as difficulty breathing, significant coughing, or mucus production, except when sick. He mentions having had COVID six weeks ago, which was productive in terms of mucus.  He has a history of smoking for 37 years, starting with two packs a day in high school and reducing to a few cigarettes a day in the last 5-10 years. He quit smoking on July 15, 2023. He denies any other inhaled exposures, significant dust, fumes, or chemical exposure, and has not been in the [employer]. He has lived in [place] for over 30 years, previously residing in [place].  He has a history of pulmonary embolism diagnosed approximately 17 years ago, but does not recall which side was affected. No current breathing difficulties or limitations in activities.  He takes Zyrtec daily for allergies, which he describes as higher than normal, and experiences year-round symptoms. He has a history of eczema, experiencing dry patches in the winter, for which he uses an ointment. He does not currently have any active eczema symptoms.  RADIOLOGY CT cardiac  morphology: Mild diffuse bronchial wall thickening, cavitary 8x5 mm posterior basilar right lower lobe pulmonary nodule, 17x9 mm subsolid nodule in the superior segment of the right lower lobe (09/06/2023) CT chest: Unchanged cavitary 8x5 mm posterior basilar right lower lobe pulmonary nodule, 17x9 mm subsolid nodule in the superior segment of the right lower lobe (09/25/2023)  Review of Systems As per HPI  Past Medical History:  Diagnosis Date   AICD (automatic cardioverter/defibrillator) present    Allergy    Arthritis    Atrial fibrillation (HCC)    CHF (congestive heart failure) (HCC)    CHF (congestive heart failure) (HCC)    Diabetes mellitus without complication (HCC)    Drug abuse (HCC)    Eczema    GERD (gastroesophageal reflux disease)    Gout    Heart attack (HCC) 06/10/2020   History of kidney stones    Hyperlipidemia    Hypertension    MI (myocardial infarction) (HCC)    PE (pulmonary embolism)    TIA (transient ischemic attack)    hx of per pt - pt not aware he had not followed by a neurologist   Ventricular tachycardia (HCC)      Family History  Problem Relation Age of Onset   Dementia Mother        frontal-temporal   Stroke Mother    Breast cancer Maternal Aunt    Other Brother        overdose   Colon cancer Neg Hx      Social History   Socioeconomic History  Marital status: Married    Spouse name: [name]   Number of children: Not on file   Years of education: Not on file   Highest education level: Bachelor's degree (e.g., BA, AB, BS)  Occupational History   Not on file  Tobacco Use   Smoking status: Former    Current packs/day: 0.00    Average packs/day: 0.3 packs/day for 31.0 years (7.8 ttl pk-yrs)    Types: Cigarettes    Quit date: 07/15/2023    Years since quitting: 0.4   Smokeless tobacco: Never   Tobacco comments:    Former smoker 11/01/23  Vaping Use   Vaping status: Never Used  Substance and Sexual Activity   Alcohol use: Yes     Comment: once a month   Drug use: Not Currently    Types: Marijuana    Comment: gummies every days   Sexual activity: Not on file  Other Topics Concern   Not on file  Social History Narrative   Lives with wife   Caffeine- coffee 4 c daily   Social Drivers of Health   Financial Resource Strain: Low Risk  (07/01/2023)   Overall Financial Resource Strain (CARDIA)    Difficulty of Paying Living Expenses: Not hard at all  Food Insecurity: No Food Insecurity (07/17/2023)   Hunger Vital Sign    Worried About Running Out of Food in the Last Year: Never true    Ran Out of Food in the Last Year: Never true  Transportation Needs: No Transportation Needs (07/17/2023)   PRAPARE - Administrator, Civil Service (Medical): No    Lack of Transportation (Non-Medical): No  Physical Activity: Insufficiently Active (07/01/2023)   Exercise Vital Sign    Days of Exercise per Week: 2 days    Minutes of Exercise per Session: 30 min  Stress: No Stress Concern Present (07/01/2023)   [employer] of Occupational Health - Occupational Stress Questionnaire    Feeling of Stress : Only a little  Social Connections: Moderately Isolated (07/01/2023)   Social Connection and Isolation Panel [NHANES]    Frequency of Communication with Friends and Family: Once a week    Frequency of Social Gatherings with Friends and Family: Once a week    Attends Religious Services: Never    Database administrator or Organizations: Yes    Attends Engineer, structural: More than 4 times per year    Marital Status: Married  Catering manager Violence: Not At Risk (07/17/2023)   Humiliation, Afraid, Rape, and Kick questionnaire    Fear of Current or Ex-Partner: No    Emotionally Abused: No    Physically Abused: No    Sexually Abused: No    - Smoked for 37 years, quit on September 2nd   No Known Allergies   Outpatient Medications Prior to Visit  Medication Sig Dispense Refill   allopurinol (ZYLOPRIM) 300 MG  tablet TAKE 2 TABLETS (600 MG TOTAL) BY MOUTH DAILY. 60 tablet 6   amiodarone (PACERONE) 200 MG tablet Take 1 tablet (200 mg total) by mouth daily. 30 tablet 5   aspirin EC 81 MG tablet Take 81 mg by mouth daily. Swallow whole.     atorvastatin (LIPITOR) 80 MG tablet TAKE 1 TABLET BY MOUTH DAILY. 30 tablet 11   calcium carbonate (TUMS - DOSED IN MG ELEMENTAL CALCIUM) 500 MG chewable tablet Chew 2-3 tablets by mouth daily as needed for indigestion or heartburn.     carvedilol (COREG) 3.125 MG tablet  TAKE 1 TABLET BY MOUTH 2 TIMES DAILY. 180 tablet 3   cetirizine (ZYRTEC) 10 MG tablet Take 10 mg by mouth daily.     cyclobenzaprine (FLEXERIL) 10 MG tablet TAKE 1 TABLET BY MOUTH 3 TIMES DAILY AS NEEDED FOR MUSCLE SPASMS. 45 tablet 1   ELIQUIS 5 MG TABS tablet TAKE 1 TABLET BY MOUTH 2 TIMES A DAY 60 tablet 11   Evolocumab (REPATHA SURECLICK) 140 MG/ML SOAJ Inject 140 mg into the skin every 14 (fourteen) days. 2 mL 11   FARXIGA 10 MG TABS tablet TAKE 1 TABLET BY MOUTH DAILY BEFORE BREAKFAST. 90 tablet 1   fenofibrate 160 MG tablet TAKE 1 TABLET BY MOUTH DAILY. 90 tablet 1   furosemide (LASIX) 20 MG tablet Take 1 tablet by mouth daily as needed for swelling or a weight gain of 3 pounds or more in 24 hours or 5 pounds in one week. 30 tablet 5   gabapentin (NEURONTIN) 100 MG capsule Take 100 mg by mouth 3 (three) times daily.     Magnesium Oxide 400 MG CAPS Take 1 capsule (400 mg total) by mouth in the morning and at bedtime. 60 capsule 11   methocarbamol (ROBAXIN) 500 MG tablet TAKE 1 TABLET BY MOUTH EVERY 6 HOURS AS NEEDED FOR MUSCLE SPASMS. 90 tablet 0   Naphazoline HCl (CLEAR EYES OP) Place 1 drop into both eyes daily as needed (sore eyes).     pantoprazole (PROTONIX) 40 MG tablet TAKE 1 TABLET BY MOUTH DAILY. 30 tablet 2   potassium chloride SA (KLOR-CON M20) 20 MEQ tablet Take 1 tablet by mouth when taking Lasix 30 tablet 5   sacubitril-valsartan (ENTRESTO) 49-51 MG Take 1 tablet by mouth 2 (two)  times daily. 60 tablet 3   Semaglutide, 2 MG/DOSE, (OZEMPIC, 2 MG/DOSE,) 8 MG/3ML SOPN Inject 2 mg into the skin once a week. (Patient taking differently: Inject 2 mg into the skin once a week. Wednesdays) 3 mL 11   sodium chloride (OCEAN) 0.65 % SOLN nasal spray Place 1 spray into both nostrils as needed (dryness).     spironolactone (ALDACTONE) 25 MG tablet Take 1 tablet (25 mg total) by mouth at bedtime. 30 tablet 6   traZODone (DESYREL) 100 MG tablet TAKE 1 TABLET (100 MG TOTAL) BY MOUTH AT BEDTIME. 90 tablet 1   triamcinolone (KENALOG) 0.025 % ointment Apply 1 Application topically daily as needed (Dry skin).     VASCEPA 1 g capsule TAKE 2 CAPSULES BY MOUTH 2 TIMES DAILY. 120 capsule 3   No facility-administered medications prior to visit.        Objective:   Physical Exam Vitals:   12/12/23 1325  BP: 99/68  Pulse: 84  SpO2: 96%  Weight: 236 lb (107 kg)  Height: 6\' 2"  (1.88 m)   Gen: Pleasant, well-nourished, in no distress,  normal affect  ENT: No lesions,  mouth clear,  oropharynx clear, no postnasal drip  Neck: No JVD, no stridor  Lungs: No use of accessory muscles, no crackles or wheezing on normal respiration, no wheeze on forced expiration  Cardiovascular: RRR, 2/6 systolic murmur, no lower extremity edema  Musculoskeletal: No deformities, no cyanosis or clubbing  Neuro: alert, awake, non focal  Skin: Warm, no lesions or rash      Assessment & Plan:  Pulmonary nodules Pulmonary Nodules Two small hazy nodules in the right lung of unclear significance identified on CT cardiac morphology study on 09/06/2023 and unchanged on follow-up CT chest on 09/25/2023. No  associated symptoms. Discussed potential causes including benign findings, infection, inflammation, autoimmune diseases, and malignancy. Low pre-test suspicion for cancerous process but nodules are large enough to warrant follow-up. -Order repeat CT chest at end of February 2025 to assess for any changes in  nodules. -Plan to review results and discuss further evaluation including potential bronchoscopy if nodules evolve.      Levy Pupa, MD, PhD 12/12/2023, 2:07 PM [facility] [phone] or if no answer before 7:00PM call [phone] For any issues after 7:00PM please call eLink [phone]

## 2023-12-13 ENCOUNTER — Ambulatory Visit (HOSPITAL_COMMUNITY): Payer: BC Managed Care – PPO

## 2023-12-13 ENCOUNTER — Encounter (HOSPITAL_COMMUNITY): Payer: 59

## 2023-12-13 NOTE — Progress Notes (Signed)
Cardiac Individual Treatment Plan  Patient Details  Name: Jason Floyd MRN: [id number] Date of Birth: 22-May-1966 Referring Provider:   Flowsheet Row INTENSIVE CARDIAC REHAB ORIENT from 12/04/2023 in [facility]  Referring Provider Arvilla Meres, MD       Initial Encounter Date:  Flowsheet Row INTENSIVE CARDIAC REHAB ORIENT from 12/04/2023 in [facility] for Heart, Vascular, [facility] Lung Health  Date 12/04/23       Visit Diagnosis: Heart failure, chronic systolic (HCC)  Patient's Home Medications on Admission:  Current Outpatient Medications:    allopurinol (ZYLOPRIM) 300 MG tablet, TAKE 2 TABLETS (600 MG TOTAL) BY MOUTH DAILY., Disp: 60 tablet, Rfl: 6   amiodarone (PACERONE) 200 MG tablet, Take 1 tablet (200 mg total) by mouth daily., Disp: 30 tablet, Rfl: 5   aspirin EC 81 MG tablet, Take 81 mg by mouth daily. Swallow whole., Disp: , Rfl:    atorvastatin (LIPITOR) 80 MG tablet, TAKE 1 TABLET BY MOUTH DAILY., Disp: 30 tablet, Rfl: 11   calcium carbonate (TUMS - DOSED IN MG ELEMENTAL CALCIUM) 500 MG chewable tablet, Chew 2-3 tablets by mouth daily as needed for indigestion or heartburn., Disp: , Rfl:    carvedilol (COREG) 3.125 MG tablet, TAKE 1 TABLET BY MOUTH 2 TIMES DAILY., Disp: 180 tablet, Rfl: 3   cetirizine (ZYRTEC) 10 MG tablet, Take 10 mg by mouth daily., Disp: , Rfl:    cyclobenzaprine (FLEXERIL) 10 MG tablet, TAKE 1 TABLET BY MOUTH 3 TIMES DAILY AS NEEDED FOR MUSCLE SPASMS., Disp: 45 tablet, Rfl: 1   ELIQUIS 5 MG TABS tablet, TAKE 1 TABLET BY MOUTH 2 TIMES A DAY, Disp: 60 tablet, Rfl: 11   Evolocumab (REPATHA SURECLICK) 140 MG/ML SOAJ, Inject 140 mg into the skin every 14 (fourteen) days., Disp: 2 mL, Rfl: 11   FARXIGA 10 MG TABS tablet, TAKE 1 TABLET BY MOUTH DAILY BEFORE BREAKFAST., Disp: 90 tablet, Rfl: 1   fenofibrate 160 MG tablet, TAKE 1 TABLET BY MOUTH DAILY., Disp: 90 tablet,  Rfl: 1   furosemide (LASIX) 20 MG tablet, Take 1 tablet by mouth daily as needed for swelling or a weight gain of 3 pounds or more in 24 hours or 5 pounds in one week., Disp: 30 tablet, Rfl: 5   gabapentin (NEURONTIN) 100 MG capsule, Take 100 mg by mouth 3 (three) times daily., Disp: , Rfl:    Magnesium Oxide 400 MG CAPS, Take 1 capsule (400 mg total) by mouth in the morning and at bedtime., Disp: 60 capsule, Rfl: 11   methocarbamol (ROBAXIN) 500 MG tablet, TAKE 1 TABLET BY MOUTH EVERY 6 HOURS AS NEEDED FOR MUSCLE SPASMS., Disp: 90 tablet, Rfl: 0   Naphazoline HCl (CLEAR EYES OP), Place 1 drop into both eyes daily as needed (sore eyes)., Disp: , Rfl:    pantoprazole (PROTONIX) 40 MG tablet, TAKE 1 TABLET BY MOUTH DAILY., Disp: 30 tablet, Rfl: 2   potassium chloride SA (KLOR-CON M20) 20 MEQ tablet, Take 1 tablet by mouth when taking Lasix, Disp: 30 tablet, Rfl: 5   sacubitril-valsartan (ENTRESTO) 49-51 MG, Take 1 tablet by mouth 2 (two) times daily., Disp: 60 tablet, Rfl: 3   Semaglutide, 2 MG/DOSE, (OZEMPIC, 2 MG/DOSE,) 8 MG/3ML SOPN, Inject 2 mg into the skin once a week. (Patient taking differently: Inject 2 mg into the skin once a week. Wednesdays), Disp: 3 mL, Rfl: 11   sodium chloride (OCEAN) 0.65 % SOLN nasal  spray, Place 1 spray into both nostrils as needed (dryness)., Disp: , Rfl:    spironolactone (ALDACTONE) 25 MG tablet, Take 1 tablet (25 mg total) by mouth at bedtime., Disp: 30 tablet, Rfl: 6   traZODone (DESYREL) 100 MG tablet, TAKE 1 TABLET (100 MG TOTAL) BY MOUTH AT BEDTIME., Disp: 90 tablet, Rfl: 1   triamcinolone (KENALOG) 0.025 % ointment, Apply 1 Application topically daily as needed (Dry skin)., Disp: , Rfl:    VASCEPA 1 g capsule, TAKE 2 CAPSULES BY MOUTH 2 TIMES DAILY., Disp: 120 capsule, Rfl: 3  Past Medical History: Past Medical History:  Diagnosis Date   AICD (automatic cardioverter/defibrillator) present    Allergy    Arthritis    Atrial fibrillation (HCC)    CHF  (congestive heart failure) (HCC)    CHF (congestive heart failure) (HCC)    Diabetes mellitus without complication (HCC)    Drug abuse (HCC)    Eczema    GERD (gastroesophageal reflux disease)    Gout    Heart attack (HCC) 06/10/2020   History of kidney stones    Hyperlipidemia    Hypertension    MI (myocardial infarction) (HCC)    PE (pulmonary embolism)    TIA (transient ischemic attack)    hx of per pt - pt not aware he had not followed by a neurologist   Ventricular tachycardia (HCC)     Tobacco Use: Social History   Tobacco Use  Smoking Status Former   Current packs/day: 0.00   Average packs/day: 0.3 packs/day for 31.0 years (7.8 ttl pk-yrs)   Types: Cigarettes   Quit date: 07/15/2023   Years since quitting: 0.4  Smokeless Tobacco Never  Tobacco Comments   Former smoker 11/01/23    Labs: Review Flowsheet  More data exists      Latest Ref Rng & Units 01/23/2022 06/07/2022 07/25/2022 05/29/2023 07/05/2023  Labs for ITP Cardiac and Pulmonary Rehab  Cholestrol 0 - 200 mg/dL 88  - 87  88  -  LDL (calc) 0 - 99 mg/dL 9  - 13  15  -  HDL-C >16 mg/dL 10.96  - 04.54  37  -  Trlycerides <150 mg/dL 098.1  - 191.4  782  -  Hemoglobin A1c 4.6 - 6.5 % 7.3  7.0  - - 8.5     Capillary Blood Glucose: Lab Results  Component Value Date   GLUCAP 151 (H) 12/11/2023   GLUCAP 134 (H) 12/11/2023   GLUCAP 142 (H) 12/09/2023   GLUCAP 157 (H) 12/09/2023   GLUCAP 164 (H) 12/04/2023     Exercise Target Goals: Exercise Program Goal: Individual exercise prescription set using results from initial 6 min walk test and THRR while considering  patient's activity barriers and safety.   Exercise Prescription Goal: Initial exercise prescription builds to 30-45 minutes a day of aerobic activity, 2-3 days per week.  Home exercise guidelines will be given to patient during program as part of exercise prescription that the participant will acknowledge.  Activity Barriers & Risk Stratification:   Activity Barriers & Cardiac Risk Stratification - 12/04/23 1411       Activity Barriers & Cardiac Risk Stratification   Activity Barriers Right Knee Replacement;Decreased Ventricular Function;Deconditioning;Neck/Spine Problems    Cardiac Risk Stratification High   <5 METs on            6 Minute Walk:  6 Minute Walk     Row Name 12/04/23 [phone]  6 Minute Walk   Phase Initial     Distance 1620 feet     Walk Time 6 minutes     # of Rest Breaks 0     MPH 3.07     METS 3.94     RPE 9     Perceived Dyspnea  0     VO2 Peak 13.8     Symptoms Yes (comment)     Comments shin tightness- resolved with rest     Resting HR 72 bpm     Resting BP 104/62     Resting Oxygen Saturation  98 %     Exercise Oxygen Saturation  during 6 min walk 98 %     Max Ex. HR 107 bpm     Max Ex. BP 120/70     2 Minute Post BP 108/72              Oxygen Initial Assessment:   Oxygen Re-Evaluation:   Oxygen Discharge (Final Oxygen Re-Evaluation):   Initial Exercise Prescription:  Initial Exercise Prescription - 12/04/23 1500       Date of Initial Exercise RX and Referring Provider   Date 12/04/23    Referring Provider Arvilla Meres, MD    Expected Discharge Date 02/26/24      Treadmill   MPH 2.8    Grade 0    Minutes 15    METs 3.5      Bike   Level 1    Watts 60    Minutes 15    METs 3      Prescription Details   Frequency (times per week) 3    Duration Progress to 30 minutes of continuous aerobic without signs/symptoms of physical distress      Intensity   THRR 40-80% of Max Heartrate 65-130    Ratings of Perceived Exertion 11-13    Perceived Dyspnea 0-4      Progression   Progression Continue progressive overload as per policy without signs/symptoms or physical distress.      Resistance Training   Training Prescription Yes    Weight 4    Reps 10-15             Perform Capillary Blood Glucose checks as needed.  Exercise Prescription  Changes:   Exercise Prescription Changes     Row Name 12/09/23 1400             Response to Exercise   Blood Pressure (Admit) 104/60       Blood Pressure (Exercise) 130/78       Blood Pressure (Exit) 105/70       Heart Rate (Admit) 79 bpm       Heart Rate (Exercise) 107 bpm       Heart Rate (Exit) 83 bpm       Rating of Perceived Exertion (Exercise) 11.5       Symptoms None       Comments Pt's first day in the CRP2 program       Duration Continue with 30 min of aerobic exercise without signs/symptoms of physical distress.       Intensity THRR unchanged         Progression   Progression Continue to progress workloads to maintain intensity without signs/symptoms of physical distress.       Average METs 2.75         Resistance Training   Training Prescription Yes       Weight 4  Reps 10-15       Time 10 Minutes         Interval Training   Interval Training No         Treadmill   MPH 2.8       Grade 0       Minutes 15       METs 3.14         Bike   Level 1       Watts 17       Minutes 15       METs 2.4                Exercise Comments:   Exercise Comments     Row Name 12/09/23 1441           Exercise Comments Pt's first day in the CRP[facility] program. Pt exercised without complaints.                Exercise Goals and Review:   Exercise Goals     Row Name 12/04/23 1411             Exercise Goals   Increase Physical Activity Yes       Intervention Develop an individualized exercise prescription for aerobic and resistive training based on initial evaluation findings, risk stratification, comorbidities and participant's personal goals.;Provide advice, education, support and counseling about physical activity/exercise needs.       Expected Outcomes Short Term: Attend rehab on a regular basis to increase amount of physical activity.;Long Term: Exercising regularly at least 3-5 days a week.;Long Term: Add in home exercise to make exercise part of  routine and to increase amount of physical activity.       Increase Strength and Stamina Yes       Intervention Provide advice, education, support and counseling about physical activity/exercise needs.;Develop an individualized exercise prescription for aerobic and resistive training based on initial evaluation findings, risk stratification, comorbidities and participant's personal goals.       Expected Outcomes Short Term: Increase workloads from initial exercise prescription for resistance, speed, and METs.;Short Term: Perform resistance training exercises routinely during rehab and add in resistance training at home;Long Term: Improve cardiorespiratory fitness, muscular endurance and strength as measured by increased METs and functional capacity ( )       Able to understand and use rate of perceived exertion (RPE) scale Yes       Intervention Provide education and explanation on how to use RPE scale       Expected Outcomes Short Term: Able to use RPE daily in rehab to express subjective intensity level;Long Term:  Able to use RPE to guide intensity level when exercising independently       Knowledge and understanding of Target Heart Rate Range (THRR) Yes       Intervention Provide education and explanation of THRR including how the numbers were predicted and where they are located for reference       Expected Outcomes Short Term: Able to state/look up THRR;Short Term: Able to use daily as guideline for intensity in rehab;Long Term: Able to use THRR to govern intensity when exercising independently       Understanding of Exercise Prescription Yes       Intervention Provide education, explanation, and written materials on patient's individual exercise prescription       Expected Outcomes Short Term: Able to explain program exercise prescription;Long Term: Able to explain home exercise prescription to exercise independently  Exercise Goals Re-Evaluation :  Exercise Goals  Re-Evaluation     Row Name 12/09/23 1436             Exercise Goal Re-Evaluation   Exercise Goals Review Increase Physical Activity;Understanding of Exercise Prescription;Increase Strength and Stamina;Knowledge and understanding of Target Heart Rate Range (THRR);Able to understand and use rate of perceived exertion (RPE) scale       Comments Pt's first day in the [facility]2 program. Pt understands the exericse Rx, RPE sclae and THRR.       Expected Outcomes Will continue to monitor patient and progress exercise workloads as tolerated.                Discharge Exercise Prescription (Final Exercise Prescription Changes):  Exercise Prescription Changes - 12/09/23 1400       Response to Exercise   Blood Pressure (Admit) 104/60    Blood Pressure (Exercise) 130/78    Blood Pressure (Exit) 105/70    Heart Rate (Admit) 79 bpm    Heart Rate (Exercise) 107 bpm    Heart Rate (Exit) 83 bpm    Rating of Perceived Exertion (Exercise) 11.5    Symptoms None    Comments Pt's first day in the CRP2 program    Duration Continue with 30 min of aerobic exercise without signs/symptoms of physical distress.    Intensity THRR unchanged      Progression   Progression Continue to progress workloads to maintain intensity without signs/symptoms of physical distress.    Average METs 2.75      Resistance Training   Training Prescription Yes    Weight 4    Reps 10-15    Time 10 Minutes      Interval Training   Interval Training No      Treadmill   MPH 2.8    Grade 0    Minutes 15    METs 3.14      Bike   Level 1    Watts 17    Minutes 15    METs 2.4             Nutrition:  Target Goals: Understanding of nutrition guidelines, daily intake of sodium 1500mg , cholesterol 200mg , calories 30% from fat and 7% or less from saturated fats, daily to have 5 or more servings of fruits and vegetables.  Biometrics:  Pre Biometrics - 12/04/23 1407       Pre Biometrics   Waist Circumference 48  inches    Hip Circumference 45 inches    Waist to Hip Ratio 1.07 %    Triceps Skinfold 24 mm    % Body Fat 33.3 %    Grip Strength 42 kg    Flexibility 13.25 in    Single Leg Stand 25 seconds              Nutrition Therapy Plan and Nutrition Goals:   Nutrition Assessments:  MEDIFICTS Score Key: >=70 Need to make dietary changes  40-70 Heart Healthy Diet <= 40 Therapeutic Level Cholesterol Diet   Flowsheet Row INTENSIVE CARDIAC REHAB from 12/09/2023 in [facility] for Heart, Vascular, & Lung Health  Picture Your Plate Total Score on Admission 47      Picture Your Plate Scores: <78 Unhealthy dietary pattern with much room for improvement. 41-50 Dietary pattern unlikely to meet recommendations for good health and room for improvement. 51-60 More healthful dietary pattern, with some room for improvement.  >60 Healthy dietary pattern, although there may  be some specific behaviors that could be improved.    Nutrition Goals Re-Evaluation:   Nutrition Goals Re-Evaluation:   Nutrition Goals Discharge (Final Nutrition Goals Re-Evaluation):   Psychosocial: Target Goals: Acknowledge presence or absence of significant depression and/or stress, maximize coping skills, provide positive support system. Participant is able to verbalize types and ability to use techniques and skills needed for reducing stress and depression.  Initial Review & Psychosocial Screening:  Initial Psych Review & Screening - 12/04/23 1412       Initial Review   Current issues with None Identified      Family Dynamics   Good Support System? Yes   Wife for support     Barriers   Psychosocial barriers to participate in program There are no identifiable barriers or psychosocial needs.      Screening Interventions   Interventions Provide feedback about the scores to participant;Encouraged to exercise    Expected Outcomes Long Term goal: The participant improves quality of  Life and PHQ9 Scores as seen by post scores and/or verbalization of changes;Short Term goal: Identification and review with participant of any Quality of Life or Depression concerns found by scoring the questionnaire.             Quality of Life Scores:  Quality of Life - 12/04/23 1520       Quality of Life   Select Quality of Life      Quality of Life Scores   Health/Function Pre 18.97 %    Socioeconomic Pre 20.5 %    Psych/Spiritual Pre 20.25 %    Family Pre 22.5 %    GLOBAL Pre 19.98 %            Scores of 19 and below usually indicate a poorer quality of life in these areas.  A difference of  2-3 points is a clinically meaningful difference.  A difference of 2-3 points in the total score of the Quality of Life Index has been associated with significant improvement in overall quality of life, self-image, physical symptoms, and general health in studies assessing change in quality of life.  PHQ-9: Review Flowsheet  More data exists      12/04/2023 07/05/2023 07/25/2022 01/23/2022 09/15/2021  Depression screen PHQ 2/9  Decreased Interest 0 0 0 0 0  Down, Depressed, Hopeless 0 0 1 0 0  PHQ - 2 Score 0 0 1 0 0  Altered sleeping 0 1 0 1 0  Tired, decreased energy 1 1 0 0 0  Change in appetite 1 1 1 1  0  Feeling bad or failure about yourself  0 0 0 0 0  Trouble concentrating 0 0 0 1 0  Moving slowly or fidgety/restless 0 0 0 0 0  Suicidal thoughts 0 0 0 0 0  PHQ-9 Score 2 3 2 3  0  Difficult doing work/chores Not difficult at all Not difficult at all Not difficult at all Not difficult at all Not difficult at all   Interpretation of Total Score  Total Score Depression Severity:  1-4 = Minimal depression, 5-9 = Mild depression, 10-14 = Moderate depression, 15-19 = Moderately severe depression, 20-27 = Severe depression   Psychosocial Evaluation and Intervention:   Psychosocial Re-Evaluation:  Psychosocial Re-Evaluation     Row Name 12/10/23 0945              Psychosocial Re-Evaluation   Current issues with None Identified       Comments Takari says sometimes he has a variation  in his appetite due to being on ozempic.       Interventions Encouraged to attend Cardiac Rehabilitation for the exercise       Continue Psychosocial Services  No Follow up required                Psychosocial Discharge (Final Psychosocial Re-Evaluation):  Psychosocial Re-Evaluation - 12/10/23 0945       Psychosocial Re-Evaluation   Current issues with None Identified    Comments Tyquavious says sometimes he has a variation in his appetite due to being on ozempic.    Interventions Encouraged to attend Cardiac Rehabilitation for the exercise    Continue Psychosocial Services  No Follow up required             Vocational Rehabilitation: Provide vocational rehab assistance to qualifying candidates.   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 Participant will state understanding/return demonstration of topics presented.    Education     Row Name 12/09/23 1500     Education   Cardiac Education Topics Pritikin   Select Workshops     Workshops   Educator Exercise Physiologist   Select Exercise   Exercise Workshop Managing Heart Disease: Your Path to a Healthier Heart   Instruction Review Code 1- Verbalizes Understanding   Class Start Time 1355   Class Stop Time 1445   Class Time Calculation (min) 50 min    Row Name 12/11/23 1500     Education   Cardiac Education Topics Pritikin   Orthoptist   Educator Dietitian   Weekly Topic Simple Sides and Sauces   Instruction Review Code 1- Verbalizes Understanding    Class Start Time 1145   Class Stop Time 1226   Class Time Calculation (min) 41 min    Row Name 12/16/23 1300     Education   Cardiac Education Topics Pritikin   Geographical information systems officer Psychosocial   Psychosocial Workshop From Head to Heart: The Power of a Healthy Outlook   Instruction Review Code 1- Verbalizes Understanding            Core Videos: Exercise    Move It!  Clinical staff conducted group or individual video education with verbal and written material and guidebook.  Patient learns the recommended Pritikin exercise program. Exercise with the goal of living a long, healthy life. Some of the health benefits of exercise include controlled diabetes, healthier blood pressure levels, improved cholesterol levels, improved heart and lung capacity, improved sleep, and better body composition. Everyone should speak with their doctor before starting or changing an exercise routine.  Biomechanical Limitations Clinical staff conducted group or individual video education with verbal and written material and guidebook.  Patient learns how biomechanical limitations can impact exercise and how we can mitigate and possibly overcome limitations to have an impactful and balanced exercise routine.  Body Composition Clinical staff conducted group or individual video education with verbal and written material and guidebook.  Patient learns that body composition (ratio of muscle mass to fat mass) is a key component  to assessing overall fitness, rather than body weight alone. Increased fat mass, especially visceral belly fat, can put [place] at increased risk for metabolic syndrome, type 2 diabetes, heart disease, and even death. It is recommended to combine diet and exercise (cardiovascular and resistance training) to improve your body composition. Seek guidance from your physician and exercise physiologist before implementing an exercise  routine.  Exercise Action Plan Clinical staff conducted group or individual video education with verbal and written material and guidebook.  Patient learns the recommended strategies to achieve and enjoy long-term exercise adherence, including variety, self-motivation, self-efficacy, and positive decision making. Benefits of exercise include fitness, good health, weight management, more energy, better sleep, less stress, and overall well-being.  Medical   Heart Disease Risk Reduction Clinical staff conducted group or individual video education with verbal and written material and guidebook.  Patient learns our heart is our most vital organ as it circulates oxygen, nutrients, white blood cells, and hormones throughout the entire body, and carries waste away. Data supports a plant-based eating plan like the Pritikin Program for its effectiveness in slowing progression of and reversing heart disease. The video provides a number of recommendations to address heart disease.   Metabolic Syndrome and Belly Fat  Clinical staff conducted group or individual video education with verbal and written material and guidebook.  Patient learns what metabolic syndrome is, how it leads to heart disease, and how one can reverse it and keep it from coming back. You have metabolic syndrome if you have 3 of the following 5 criteria: abdominal obesity, high blood pressure, high triglycerides, low HDL cholesterol, and high blood sugar.  Hypertension and Heart Disease Clinical staff conducted group or individual video education with verbal and written material and guidebook.  Patient learns that high blood pressure, or hypertension, is very common in the [place]. Hypertension is largely due to excessive salt intake, but other important risk factors include being overweight, physical inactivity, drinking too much alcohol, smoking, and not eating enough potassium from fruits and vegetables. High blood pressure is a  leading risk factor for heart attack, stroke, congestive heart failure, dementia, kidney failure, and premature death. Long-term effects of excessive salt intake include stiffening of the arteries and thickening of heart muscle and organ damage. Recommendations include ways to reduce hypertension and the risk of heart disease.  Diseases of Our Time - Focusing on Diabetes Clinical staff conducted group or individual video education with verbal and written material and guidebook.  Patient learns why the best way to stop diseases of our time is prevention, through food and other lifestyle changes. Medicine (such as prescription pills and surgeries) is often only a Band-Aid on the problem, not a long-term solution. Most common diseases of our time include obesity, type 2 diabetes, hypertension, heart disease, and cancer. The Pritikin Program is recommended and has been proven to help reduce, reverse, and/or prevent the damaging effects of metabolic syndrome.  Nutrition   Overview of the Pritikin Eating Plan  Clinical staff conducted group or individual video education with verbal and written material and guidebook.  Patient learns about the Pritikin Eating Plan for disease risk reduction. The Pritikin Eating Plan emphasizes a wide variety of unrefined, minimally-processed carbohydrates, like fruits, vegetables, whole grains, and legumes. Go, Caution, and Stop food choices are explained. Plant-based and lean animal proteins are emphasized. Rationale provided for low sodium intake for blood pressure control, low added sugars for blood sugar stabilization, and low added fats and oils for coronary artery  disease risk reduction and weight management.  Calorie Density  Clinical staff conducted group or individual video education with verbal and written material and guidebook.  Patient learns about calorie density and how it impacts the Pritikin Eating Plan. Knowing the characteristics of the food you choose will  help you decide whether those foods will lead to weight gain or weight loss, and whether you want to consume more or less of them. Weight loss is usually a side effect of the Pritikin Eating Plan because of its focus on low calorie-dense foods.  Label Reading  Clinical staff conducted group or individual video education with verbal and written material and guidebook.  Patient learns about the Pritikin recommended label reading guidelines and corresponding recommendations regarding calorie density, added sugars, sodium content, and whole grains.  Dining Out - Part 1  Clinical staff conducted group or individual video education with verbal and written material and guidebook.  Patient learns that restaurant meals can be sabotaging because they can be so high in calories, fat, sodium, and/or sugar. Patient learns recommended strategies on how to positively address this and avoid unhealthy pitfalls.  Facts on Fats  Clinical staff conducted group or individual video education with verbal and written material and guidebook.  Patient learns that lifestyle modifications can be just as effective, if not more so, as many medications for lowering your risk of heart disease. A Pritikin lifestyle can help to reduce your risk of inflammation and atherosclerosis (cholesterol build-up, or plaque, in the artery walls). Lifestyle interventions such as dietary choices and physical activity address the cause of atherosclerosis. A review of the types of fats and their impact on blood cholesterol levels, along with dietary recommendations to reduce fat intake is also included.  Nutrition Action Plan  Clinical staff conducted group or individual video education with verbal and written material and guidebook.  Patient learns how to incorporate Pritikin recommendations into their lifestyle. Recommendations include planning and keeping personal health goals in mind as an important part of their success.  Healthy Mind-Set     Healthy Minds, Bodies, Hearts  Clinical staff conducted group or individual video education with verbal and written material and guidebook.  Patient learns how to identify when they are stressed. Video will discuss the impact of that stress, as well as the many benefits of stress management. Patient will also be introduced to stress management techniques. The way we think, act, and feel has an impact on our hearts.  How Our Thoughts Can Heal Our Hearts  Clinical staff conducted group or individual video education with verbal and written material and guidebook.  Patient learns that negative thoughts can cause depression and anxiety. This can result in negative lifestyle behavior and serious health problems. Cognitive behavioral therapy is an effective method to help control our thoughts in order to change and improve our emotional outlook.  Additional Videos:  Exercise    Improving Performance  Clinical staff conducted group or individual video education with verbal and written material and guidebook.  Patient learns to use a non-linear approach by alternating intensity levels and lengths of time spent exercising to help burn more calories and lose more body fat. Cardiovascular exercise helps improve heart health, metabolism, hormonal balance, blood sugar control, and recovery from fatigue. Resistance training improves strength, endurance, balance, coordination, reaction time, metabolism, and muscle mass. Flexibility exercise improves circulation, posture, and balance. Seek guidance from your physician and exercise physiologist before implementing an exercise routine and learn your capabilities and proper form for  all exercise.  Introduction to Yoga  Clinical staff conducted group or individual video education with verbal and written material and guidebook.  Patient learns about yoga, a discipline of the coming together of mind, breath, and body. The benefits of yoga include improved flexibility,  improved range of motion, better posture and core strength, increased lung function, weight loss, and positive self-image. Yoga's heart health benefits include lowered blood pressure, healthier heart rate, decreased cholesterol and triglyceride levels, improved immune function, and reduced stress. Seek guidance from your physician and exercise physiologist before implementing an exercise routine and learn your capabilities and proper form for all exercise.  Medical   Aging: Enhancing Your Quality of Life  Clinical staff conducted group or individual video education with verbal and written material and guidebook.  Patient learns key strategies and recommendations to stay in good physical health and enhance quality of life, such as prevention strategies, having an advocate, securing a Health Care Proxy and Power of Attorney, and keeping a list of medications and system for tracking them. It also discusses how to avoid risk for bone loss.  Biology of Weight Control  Clinical staff conducted group or individual video education with verbal and written material and guidebook.  Patient learns that weight gain occurs because we consume more calories than we burn (eating more, moving less). Even if your body weight is normal, you may have higher ratios of fat compared to muscle mass. Too much body fat puts you at increased risk for cardiovascular disease, heart attack, stroke, type 2 diabetes, and obesity-related cancers. In addition to exercise, following the Pritikin Eating Plan can help reduce your risk.  Decoding Lab Results  Clinical staff conducted group or individual video education with verbal and written material and guidebook.  Patient learns that lab test reflects one measurement whose values change over time and are influenced by many factors, including medication, stress, sleep, exercise, food, hydration, pre-existing medical conditions, and more. It is recommended to use the knowledge from this  video to become more involved with your lab results and evaluate your numbers to speak with your doctor.   Diseases of Our Time - Overview  Clinical staff conducted group or individual video education with verbal and written material and guidebook.  Patient learns that according to the CDC, 50% to 70% of chronic diseases (such as obesity, type 2 diabetes, elevated lipids, hypertension, and heart disease) are avoidable through lifestyle improvements including healthier food choices, listening to satiety cues, and increased physical activity.  Sleep Disorders Clinical staff conducted group or individual video education with verbal and written material and guidebook.  Patient learns how good quality and duration of sleep are important to overall health and well-being. Patient also learns about sleep disorders and how they impact health along with recommendations to address them, including discussing with a physician.  Nutrition  Dining Out - Part 2 Clinical staff conducted group or individual video education with verbal and written material and guidebook.  Patient learns how to plan ahead and communicate in order to maximize their dining experience in a healthy and nutritious manner. Included are recommended food choices based on the type of restaurant the patient is visiting.   Fueling a Banker conducted group or individual video education with verbal and written material and guidebook.  There is a strong connection between our food choices and our health. Diseases like obesity and type 2 diabetes are very prevalent and are in large-part due to lifestyle choices. The  Pritikin Eating Plan provides plenty of food and hunger-curbing satisfaction. It is easy to follow, affordable, and helps reduce health risks.  Menu Workshop  Clinical staff conducted group or individual video education with verbal and written material and guidebook.  Patient learns that restaurant meals can  sabotage health goals because they are often packed with calories, fat, sodium, and sugar. Recommendations include strategies to plan ahead and to communicate with the manager, chef, or server to help order a healthier meal.  Planning Your Eating Strategy  Clinical staff conducted group or individual video education with verbal and written material and guidebook.  Patient learns about the Pritikin Eating Plan and its benefit of reducing the risk of disease. The Pritikin Eating Plan does not focus on calories. Instead, it emphasizes high-quality, nutrient-rich foods. By knowing the characteristics of the foods, we choose, we can determine their calorie density and make informed decisions.  Targeting Your Nutrition Priorities  Clinical staff conducted group or individual video education with verbal and written material and guidebook.  Patient learns that lifestyle habits have a tremendous impact on disease risk and progression. This video provides eating and physical activity recommendations based on your personal health goals, such as reducing LDL cholesterol, losing weight, preventing or controlling type 2 diabetes, and reducing high blood pressure.  Vitamins and Minerals  Clinical staff conducted group or individual video education with verbal and written material and guidebook.  Patient learns different ways to obtain key vitamins and minerals, including through a recommended healthy diet. It is important to discuss all supplements you take with your doctor.   Healthy Mind-Set    Smoking Cessation  Clinical staff conducted group or individual video education with verbal and written material and guidebook.  Patient learns that cigarette smoking and tobacco addiction pose a serious health risk which affects millions of people. Stopping smoking will significantly reduce the risk of heart disease, lung disease, and many forms of cancer. Recommended strategies for quitting are covered, including  working with your doctor to develop a successful plan.  Culinary   Becoming a Set designer conducted group or individual video education with verbal and written material and guidebook.  Patient learns that cooking at home can be healthy, cost-effective, quick, and puts them in control. Keys to cooking healthy recipes will include looking at your recipe, assessing your equipment needs, planning ahead, making it simple, choosing cost-effective seasonal ingredients, and limiting the use of added fats, salts, and sugars.  Cooking - Breakfast and Snacks  Clinical staff conducted group or individual video education with verbal and written material and guidebook.  Patient learns how important breakfast is to satiety and nutrition through the entire day. Recommendations include key foods to eat during breakfast to help stabilize blood sugar levels and to prevent overeating at meals later in the day. Planning ahead is also a key component.  Cooking - Educational psychologist conducted group or individual video education with verbal and written material and guidebook.  Patient learns eating strategies to improve overall health, including an approach to cook more at home. Recommendations include thinking of animal protein as a side on your plate rather than center stage and focusing instead on lower calorie dense options like vegetables, fruits, whole grains, and plant-based proteins, such as beans. Making sauces in large quantities to freeze for later and leaving the skin on your vegetables are also recommended to maximize your experience.  Cooking - Healthy Salads and Dressing Clinical staff conducted  group or individual video education with verbal and written material and guidebook.  Patient learns that vegetables, fruits, whole grains, and legumes are the foundations of the Pritikin Eating Plan. Recommendations include how to incorporate each of these in flavorful and healthy  salads, and how to create homemade salad dressings. Proper handling of ingredients is also covered. Cooking - Soups and [employer] - Soups and Desserts Clinical staff conducted group or individual video education with verbal and written material and guidebook.  Patient learns that Pritikin soups and desserts make for easy, nutritious, and delicious snacks and meal components that are low in sodium, fat, sugar, and calorie density, while high in vitamins, minerals, and filling fiber. Recommendations include simple and healthy ideas for soups and desserts.   Overview     The Pritikin Solution Program Overview Clinical staff conducted group or individual video education with verbal and written material and guidebook.  Patient learns that the results of the Pritikin Program have been documented in more than 100 articles published in peer-reviewed journals, and the benefits include reducing risk factors for (and, in some cases, even reversing) high cholesterol, high blood pressure, type 2 diabetes, obesity, and more! An overview of the three key pillars of the Pritikin Program will be covered: eating well, doing regular exercise, and having a healthy mind-set.  WORKSHOPS  Exercise: Exercise Basics: Building Your Action Plan Clinical staff led group instruction and group discussion with PowerPoint presentation and patient guidebook. To enhance the learning environment the use of posters, models and videos may be added. At the conclusion of this workshop, patients will comprehend the difference between physical activity and exercise, as well as the benefits of incorporating both, into their routine. Patients will understand the FITT (Frequency, Intensity, Time, and Type) principle and how to use it to build an exercise action plan. In addition, safety concerns and other considerations for exercise and cardiac rehab will be addressed by the presenter. The purpose of this lesson is to promote a  comprehensive and effective weekly exercise routine in order to improve patients' overall level of fitness.   Managing Heart Disease: Your Path to a Healthier Heart Clinical staff led group instruction and group discussion with PowerPoint presentation and patient guidebook. To enhance the learning environment the use of posters, models and videos may be added.At the conclusion of this workshop, patients will understand the anatomy and physiology of the heart. Additionally, they will understand how Pritikin's three pillars impact the risk factors, the progression, and the management of heart disease.  The purpose of this lesson is to provide a high-level overview of the heart, heart disease, and how the Pritikin lifestyle positively impacts risk factors.  Exercise Biomechanics Clinical staff led group instruction and group discussion with PowerPoint presentation and patient guidebook. To enhance the learning environment the use of posters, models and videos may be added. Patients will learn how the structural parts of their bodies function and how these functions impact their daily activities, movement, and exercise. Patients will learn how to promote a neutral spine, learn how to manage pain, and identify ways to improve their physical movement in order to promote healthy living. The purpose of this lesson is to expose patients to common physical limitations that impact physical activity. Participants will learn practical ways to adapt and manage aches and pains, and to minimize their effect on regular exercise. Patients will learn how to maintain good posture while sitting, walking, and lifting.  Balance Training and Fall Prevention  Clinical  staff led group instruction and group discussion with PowerPoint presentation and patient guidebook. To enhance the learning environment the use of posters, models and videos may be added. At the conclusion of this workshop, patients will understand the  importance of their sensorimotor skills (vision, proprioception, and the vestibular system) in maintaining their ability to balance as they age. Patients will apply a variety of balancing exercises that are appropriate for their current level of function. Patients will understand the common causes for poor balance, possible solutions to these problems, and ways to modify their physical environment in order to minimize their fall risk. The purpose of this lesson is to teach patients about the importance of maintaining balance as they age and ways to minimize their risk of falling.  WORKSHOPS   Nutrition:  Fueling a Ship broker led group instruction and group discussion with PowerPoint presentation and patient guidebook. To enhance the learning environment the use of posters, models and videos may be added. Patients will review the foundational principles of the Pritikin Eating Plan and understand what constitutes a serving size in each of the food groups. Patients will also learn Pritikin-friendly foods that are better choices when away from home and review make-ahead meal and snack options. Calorie density will be reviewed and applied to three nutrition priorities: weight maintenance, weight loss, and weight gain. The purpose of this lesson is to reinforce (in a group setting) the key concepts around what patients are recommended to eat and how to apply these guidelines when away from home by planning and selecting Pritikin-friendly options. Patients will understand how calorie density may be adjusted for different weight management goals.  Mindful Eating  Clinical staff led group instruction and group discussion with PowerPoint presentation and patient guidebook. To enhance the learning environment the use of posters, models and videos may be added. Patients will briefly review the concepts of the Pritikin Eating Plan and the importance of low-calorie dense foods. The concept of mindful  eating will be introduced as well as the importance of paying attention to internal hunger signals. Triggers for non-hunger eating and techniques for dealing with triggers will be explored. The purpose of this lesson is to provide patients with the opportunity to review the basic principles of the Pritikin Eating Plan, discuss the value of eating mindfully and how to measure internal cues of hunger and fullness using the Hunger Scale. Patients will also discuss reasons for non-hunger eating and learn strategies to use for controlling emotional eating.  Targeting Your Nutrition Priorities Clinical staff led group instruction and group discussion with PowerPoint presentation and patient guidebook. To enhance the learning environment the use of posters, models and videos may be added. Patients will learn how to determine their genetic susceptibility to disease by reviewing their family history. Patients will gain insight into the importance of diet as part of an overall healthy lifestyle in mitigating the impact of genetics and other environmental insults. The purpose of this lesson is to provide patients with the opportunity to assess their personal nutrition priorities by looking at their family history, their own health history and current risk factors. Patients will also be able to discuss ways of prioritizing and modifying the Pritikin Eating Plan for their highest risk areas  Menu  Clinical staff led group instruction and group discussion with PowerPoint presentation and patient guidebook. To enhance the learning environment the use of posters, models and videos may be added. Using menus brought in from [employer], or printed from online  menus, patients will apply the Pritikin dining out guidelines that were presented in the [employer] video. Patients will also be able to practice these guidelines in a variety of provided scenarios. The purpose of this lesson is to provide  patients with the opportunity to practice hands-on learning of the Pritikin Dining Out guidelines with actual menus and practice scenarios.  Label Reading Clinical staff led group instruction and group discussion with PowerPoint presentation and patient guidebook. To enhance the learning environment the use of posters, models and videos may be added. Patients will review and discuss the Pritikin label reading guidelines presented in Pritikin's Label Reading Educational series video. Using fool labels brought in from local grocery stores and markets, patients will apply the label reading guidelines and determine if the packaged food meet the Pritikin guidelines. The purpose of this lesson is to provide patients with the opportunity to review, discuss, and practice hands-on learning of the Pritikin Label Reading guidelines with actual packaged food labels. Cooking School  Pritikin's [employer] are designed to teach patients ways to prepare quick, simple, and affordable recipes at home. The importance of nutrition's role in chronic disease risk reduction is reflected in its emphasis in the overall Pritikin program. By learning how to prepare essential core Pritikin Eating Plan recipes, patients will increase control over what they eat; be able to customize the flavor of foods without the use of added salt, sugar, or fat; and improve the quality of the food they consume. By learning a set of core recipes which are easily assembled, quickly prepared, and affordable, patients are more likely to prepare more healthy foods at home. These workshops focus on convenient breakfasts, simple entres, side dishes, and desserts which can be prepared with minimal effort and are consistent with nutrition recommendations for cardiovascular risk reduction. Cooking [employer] are taught by a Armed forces logistics/support/administrative officer (RD) who has been trained by the [employer]. The chef or RD has a clear  understanding of the importance of minimizing - if not completely eliminating - added fat, sugar, and sodium in recipes. Throughout the series of Cooking School Workshop sessions, patients will learn about healthy ingredients and efficient methods of cooking to build confidence in their capability to prepare    Cooking School weekly topics:  Adding Flavor- Sodium-Free  Fast and Healthy Breakfasts  Powerhouse Plant-Based Proteins  Satisfying Salads and Dressings  Simple Sides and Sauces  International Cuisine-Spotlight on the [employer] Zones  Delicious Desserts  Savory Soups  [employer] - Meals in a Astronomer Appetizers and Snacks  Comforting Weekend Breakfasts  One-Pot Wonders   Fast Evening Meals  Landscape architect Your Pritikin Plate  WORKSHOPS   Healthy Mindset (Psychosocial):  Focused Goals, Sustainable Changes Clinical staff led group instruction and group discussion with PowerPoint presentation and patient guidebook. To enhance the learning environment the use of posters, models and videos may be added. Patients will be able to apply effective goal setting strategies to establish at least one personal goal, and then take consistent, meaningful action toward that goal. They will learn to identify common barriers to achieving personal goals and develop strategies to overcome them. Patients will also gain an understanding of how our mind-set can impact our ability to achieve goals and the importance of cultivating a positive and growth-oriented mind-set. The purpose of this lesson is to provide patients with a deeper understanding of how to set and achieve personal goals, as well as  the tools and strategies needed to overcome common obstacles which may arise along the way.  From Head to Heart: The Power of a Healthy Outlook  Clinical staff led group instruction and group discussion with PowerPoint presentation and patient guidebook. To enhance the learning  environment the use of posters, models and videos may be added. Patients will be able to recognize and describe the impact of emotions and mood on physical health. They will discover the importance of self-care and explore self-care practices which may work for them. Patients will also learn how to utilize the 4 C's to cultivate a healthier outlook and better manage stress and challenges. The purpose of this lesson is to demonstrate to patients how a healthy outlook is an essential part of maintaining good health, especially as they continue their cardiac rehab journey.  Healthy Sleep for a Healthy Heart Clinical staff led group instruction and group discussion with PowerPoint presentation and patient guidebook. To enhance the learning environment the use of posters, models and videos may be added. At the conclusion of this workshop, patients will be able to demonstrate knowledge of the importance of sleep to overall health, well-being, and quality of life. They will understand the symptoms of, and treatments for, common sleep disorders. Patients will also be able to identify daytime and nighttime behaviors which impact sleep, and they will be able to apply these tools to help manage sleep-related challenges. The purpose of this lesson is to provide patients with a general overview of sleep and outline the importance of quality sleep. Patients will learn about a few of the most common sleep disorders. Patients will also be introduced to the concept of "sleep hygiene," and discover ways to self-manage certain sleeping problems through simple daily behavior changes. Finally, the workshop will motivate patients by clarifying the links between quality sleep and their goals of heart-healthy living.   Recognizing and Reducing Stress Clinical staff led group instruction and group discussion with PowerPoint presentation and patient guidebook. To enhance the learning environment the use of posters, models and videos  may be added. At the conclusion of this workshop, patients will be able to understand the types of stress reactions, differentiate between acute and chronic stress, and recognize the impact that chronic stress has on their health. They will also be able to apply different coping mechanisms, such as reframing negative self-talk. Patients will have the opportunity to practice a variety of stress management techniques, such as deep abdominal breathing, progressive muscle relaxation, and/or guided imagery.  The purpose of this lesson is to educate patients on the role of stress in their lives and to provide healthy techniques for coping with it.  Learning Barriers/Preferences:  Learning Barriers/Preferences - 12/04/23 1412       Learning Barriers/Preferences   Learning Barriers Sight   reading glasses   Learning Preferences Audio;Computer/Internet;Group Instruction;Individual Instruction;Pictoral;Skilled Demonstration;Verbal Instruction;Video;Written Material             Education Topics:  Knowledge Questionnaire Score:  Knowledge Questionnaire Score - 12/04/23 1412       Knowledge Questionnaire Score   Pre Score 24/24             Core Components/Risk Factors/Patient Goals at Admission:  Personal Goals and Risk Factors at Admission - 12/04/23 1413       Core Components/Risk Factors/Patient Goals on Admission    Weight Management Yes;Obesity;Weight Loss    Intervention Weight Management: Develop a combined nutrition and exercise program designed to reach desired caloric intake, while maintaining  appropriate intake of nutrient and fiber, sodium and fats, and appropriate energy expenditure required for the weight goal.;Weight Management: Provide education and appropriate resources to help participant work on and attain dietary goals.;Weight Management/Obesity: Establish reasonable short term and long term weight goals.;Obesity: Provide education and appropriate resources to help  participant work on and attain dietary goals.    Expected Outcomes Short Term: Continue to assess and modify interventions until short term weight is achieved;Long Term: Adherence to nutrition and physical activity/exercise program aimed toward attainment of established weight goal;Weight Loss: Understanding of general recommendations for a balanced deficit meal plan, which promotes 1-2 lb weight loss per week and includes a negative energy balance of [phone] kcal/d;Understanding recommendations for meals to include 15-35% energy as protein, 25-35% energy from fat, 35-60% energy from carbohydrates, less than 200mg  of dietary cholesterol, 20-35 gm of total fiber daily;Understanding of distribution of calorie intake throughout the day with the consumption of 4-5 meals/snacks    Diabetes Yes    Intervention Provide education about signs/symptoms and action to take for hypo/hyperglycemia.;Provide education about proper nutrition, including hydration, and aerobic/resistive exercise prescription along with prescribed medications to achieve blood glucose in normal ranges: Fasting glucose 65-99 mg/dL    Expected Outcomes Short Term: Participant verbalizes understanding of the signs/symptoms and immediate care of hyper/hypoglycemia, proper foot care and importance of medication, aerobic/resistive exercise and nutrition plan for blood glucose control.;Long Term: Attainment of HbA1C < 7%.    Heart Failure Yes    Intervention Provide a combined exercise and nutrition program that is supplemented with education, support and counseling about heart failure. Directed toward relieving symptoms such as shortness of breath, decreased exercise tolerance, and extremity edema.    Expected Outcomes Improve functional capacity of life;Short term: Attendance in program 2-3 days a week with increased exercise capacity. Reported lower sodium intake. Reported increased fruit and vegetable intake. Reports medication compliance.;Short  term: Daily weights obtained and reported for increase. Utilizing diuretic protocols set by physician.;Long term: Adoption of self-care skills and reduction of barriers for early signs and symptoms recognition and intervention leading to self-care maintenance.    Hypertension Yes    Intervention Provide education on lifestyle modifcations including regular physical activity/exercise, weight management, moderate sodium restriction and increased consumption of fresh fruit, vegetables, and low fat dairy, alcohol moderation, and smoking cessation.;Monitor prescription use compliance.    Expected Outcomes Short Term: Continued assessment and intervention until BP is < 140/59mm HG in hypertensive participants. < 130/41mm HG in hypertensive participants with diabetes, heart failure or chronic kidney disease.;Long Term: Maintenance of blood pressure at goal levels.    Lipids Yes    Intervention Provide education and support for participant on nutrition & aerobic/resistive exercise along with prescribed medications to achieve LDL 70mg , HDL >40mg .    Expected Outcomes Short Term: Participant states understanding of desired cholesterol values and is compliant with medications prescribed. Participant is following exercise prescription and nutrition guidelines.;Long Term: Cholesterol controlled with medications as prescribed, with individualized exercise RX and with personalized nutrition plan. Value goals: LDL < 70mg , HDL > 40 mg.             Core Components/Risk Factors/Patient Goals Review:   Goals and Risk Factor Review     Row Name 12/10/23 0951 12/13/23 0929           Core Components/Risk Factors/Patient Goals Review   Personal Goals Review Weight Management/Obesity;Heart Failure;Lipids;Hypertension;Diabetes Weight Management/Obesity;Heart Failure;Lipids;Hypertension;Diabetes      Review Karim started cardiac rehab on 12/09/23.  Decorey did well with exercise. Vital signs and CBG's were stable.  Danielle does not check his CBG's at home. Spot checked CBG's at cardiac rehab. Crawford started cardiac rehab on 12/09/23. Karman is off to a good start  with exercise. Vital signs and CBG's have been stable. Younes does not check his CBG's at home. Spot checked CBG's at cardiac rehab. Karlin has lost 1.5 kg since starting cardiac rehab.      Expected Outcomes Copelan will continue to participate in cardiac rehab for exercise, nutrition and lifestyle modifications Gregg will continue to participate in cardiac rehab for exercise, nutrition and lifestyle modifications               Core Components/Risk Factors/Patient Goals at Discharge (Final Review):   Goals and Risk Factor Review - 12/13/23 0929       Core Components/Risk Factors/Patient Goals Review   Personal Goals Review Weight Management/Obesity;Heart Failure;Lipids;Hypertension;Diabetes    Review Governor started cardiac rehab on 12/09/23. Danuel is off to a good start  with exercise. Vital signs and CBG's have been stable. Arrick does not check his CBG's at home. Spot checked CBG's at cardiac rehab. Shelby has lost 1.5 kg since starting cardiac rehab.    Expected Outcomes Massimiliano will continue to participate in cardiac rehab for exercise, nutrition and lifestyle modifications             ITP Comments:  ITP Comments     Row Name 12/04/23 1407 12/10/23 0942 12/13/23 0928       ITP Comments Dr. Armanda Magic medical director. Introduction to pritikin education/intensive cardiac rehab. Initial orientation packet reviewed with patient. 30 Day ITP Review. Jarrette started cardiac rehab on 12/09/23. Deshay did well with exercise. 30 Day ITP Review. Damir started cardiac rehab on 12/09/23. Lori is off to a good start  with exercise.              Comments: See ITP comments.Thayer Headings RN BSN

## 2023-12-16 ENCOUNTER — Ambulatory Visit (HOSPITAL_COMMUNITY): Payer: BC Managed Care – PPO

## 2023-12-16 ENCOUNTER — Encounter (HOSPITAL_COMMUNITY)
Admission: RE | Admit: 2023-12-16 | Discharge: 2023-12-16 | Disposition: A | Discharge status: Home / Self Care | Payer: 59 | Source: Ambulatory Visit | Attending: Internal Medicine | Admitting: Internal Medicine

## 2023-12-16 ENCOUNTER — Encounter: Payer: Self-pay | Admitting: Internal Medicine

## 2023-12-16 DIAGNOSIS — I5022 Chronic systolic (congestive) heart failure: Secondary | ICD-10-CM | POA: Insufficient documentation

## 2023-12-18 ENCOUNTER — Encounter (HOSPITAL_COMMUNITY)
Admission: RE | Admit: 2023-12-18 | Discharge: 2023-12-18 | Disposition: A | Discharge status: Home / Self Care | Payer: 59 | Source: Ambulatory Visit | Attending: Internal Medicine

## 2023-12-18 ENCOUNTER — Ambulatory Visit: Payer: 59 | Admitting: Family Medicine

## 2023-12-18 ENCOUNTER — Encounter: Payer: Self-pay | Admitting: Family Medicine

## 2023-12-18 ENCOUNTER — Ambulatory Visit (HOSPITAL_COMMUNITY): Payer: BC Managed Care – PPO

## 2023-12-18 VITALS — BP 128/68 | HR 75 | Temp 97.8°F | Ht 74.0 in | Wt 236.2 lb

## 2023-12-18 DIAGNOSIS — I5022 Chronic systolic (congestive) heart failure: Secondary | ICD-10-CM | POA: Diagnosis not present

## 2023-12-18 DIAGNOSIS — R7989 Other specified abnormal findings of blood chemistry: Secondary | ICD-10-CM | POA: Diagnosis not present

## 2023-12-18 DIAGNOSIS — Z0000 Encounter for general adult medical examination without abnormal findings: Secondary | ICD-10-CM | POA: Diagnosis not present

## 2023-12-18 DIAGNOSIS — Z125 Encounter for screening for malignant neoplasm of prostate: Secondary | ICD-10-CM | POA: Diagnosis not present

## 2023-12-18 DIAGNOSIS — E1129 Type 2 diabetes mellitus with other diabetic kidney complication: Secondary | ICD-10-CM | POA: Diagnosis not present

## 2023-12-18 DIAGNOSIS — F419 Anxiety disorder, unspecified: Secondary | ICD-10-CM

## 2023-12-18 LAB — CBC WITH DIFFERENTIAL/PLATELET
Basophils Absolute: 0 10*3/uL (ref 0.0–0.1)
Basophils Relative: 0.5 % (ref 0.0–3.0)
Eosinophils Absolute: 0.2 10*3/uL (ref 0.0–0.7)
Eosinophils Relative: 1.7 % (ref 0.0–5.0)
HCT: 46.7 % (ref 39.0–52.0)
Hemoglobin: 15.7 g/dL (ref 13.0–17.0)
Lymphocytes Relative: 32.5 % (ref 12.0–46.0)
Lymphs Abs: 2.9 10*3/uL (ref 0.7–4.0)
MCHC: 33.7 g/dL (ref 30.0–36.0)
MCV: 95.6 fL (ref 78.0–100.0)
Monocytes Absolute: 0.7 10*3/uL (ref 0.1–1.0)
Monocytes Relative: 7.7 % (ref 3.0–12.0)
Neutro Abs: 5.2 10*3/uL (ref 1.4–7.7)
Neutrophils Relative %: 57.6 % (ref 43.0–77.0)
Platelets: 274 10*3/uL (ref 150.0–400.0)
RBC: 4.89 Mil/uL (ref 4.22–5.81)
RDW: 13.5 % (ref 11.5–15.5)
WBC: 9.1 10*3/uL (ref 4.0–10.5)

## 2023-12-18 LAB — LIPID PANEL
Cholesterol: 96 mg/dL (ref 0–200)
HDL: 48.4 mg/dL (ref >39.00)
LDL Cholesterol: 28 mg/dL (ref 0–99)
NonHDL: 47.29
Total CHOL/HDL Ratio: 2
Triglycerides: 97 mg/dL (ref 0.0–149.0)
VLDL: 19.4 mg/dL (ref 0.0–40.0)

## 2023-12-18 LAB — BASIC METABOLIC PANEL
BUN: 17 mg/dL (ref 6–23)
CO2: 32 meq/L (ref 19–32)
Calcium: 9.8 mg/dL (ref 8.4–10.5)
Chloride: 98 meq/L (ref 96–112)
Creatinine, Ser: 1.32 mg/dL (ref 0.40–1.50)
GFR: 59.19 mL/min — ABNORMAL LOW (ref >60.00)
Glucose, Bld: 121 mg/dL — ABNORMAL HIGH (ref 70–99)
Potassium: 4.6 meq/L (ref 3.5–5.1)
Sodium: 137 meq/L (ref 135–145)

## 2023-12-18 LAB — T4, FREE: Free T4: 1 ng/dL (ref 0.60–1.60)

## 2023-12-18 LAB — HEPATIC FUNCTION PANEL
ALT: 39 U/L (ref 0–53)
AST: 25 U/L (ref 0–37)
Albumin: 4.8 g/dL (ref 3.5–5.2)
Alkaline Phosphatase: 38 U/L — ABNORMAL LOW (ref 39–117)
Bilirubin, Direct: 0.1 mg/dL (ref 0.0–0.3)
Total Bilirubin: 0.5 mg/dL (ref 0.2–1.2)
Total Protein: 7.3 g/dL (ref 6.0–8.3)

## 2023-12-18 LAB — PSA: PSA: 0.59 ng/mL (ref 0.10–4.00)

## 2023-12-18 LAB — T3, FREE: T3, Free: 3.8 pg/mL (ref 2.3–4.2)

## 2023-12-18 LAB — HEMOGLOBIN A1C: Hgb A1c MFr Bld: 7.4 % — ABNORMAL HIGH (ref 4.6–6.5)

## 2023-12-18 LAB — TSH: TSH: 7.27 u[IU]/mL — ABNORMAL HIGH (ref 0.35–5.50)

## 2023-12-18 NOTE — Patient Instructions (Addendum)
 Follow up in 3 months to recheck sugar We'll notify you of your lab results and make any changes if needed Keep up the good work on healthy diet and regular exercise- you look great! We'll call you to schedule a counseling appt Once we see your thyroid  studies, we'll determine if Endo is needed ADD GasX or Phazyme to help w/ gas.  If no improvement, let me know and we can adjust the dose Call with any questions or concerns Stay Safe!  Stay Healthy! Hang in there!!!

## 2023-12-18 NOTE — Assessment & Plan Note (Signed)
 Pt's PE WNL.  UTD on colonoscopy, Tdap, flu.  Declines PNA vaccine.  Check labs.  Anticipatory guidance provided.

## 2023-12-18 NOTE — Progress Notes (Signed)
   Subjective:    Patient ID: Jason Floyd, male    DOB: [birth year], 57 y.o.   MRN: [id number]  HPI CPE- UTD on eye exam, foot exam, microalbumin, colonoscopy, Tdap, flu.  Declines PNA  Patient Care Team    Relationship Specialty Notifications Start End  Mahlon Comer BRAVO, MD PCP - General Family Medicine  05/28/23   Bensimhon, Toribio SAUNDERS, MD PCP - Advanced Heart Failure Cardiology  06/30/20   Fernande Elspeth BROCKS, MD PCP - Electrophysiology Cardiology  01/01/23      Health Maintenance  Topic Date Due  . Pneumococcal Vaccine 35-28 Years old (2 of 2 - PCV) 04/26/2022  . COVID-19 Vaccine (6 - 2024-25 season) 07/14/2023  . HEMOGLOBIN A1C  01/05/2024  . Diabetic kidney evaluation - Urine ACR  07/04/2024  . FOOT EXAM  07/04/2024  . DTaP/Tdap/Td (2 - Td or Tdap) 09/08/2024  . OPHTHALMOLOGY EXAM  09/24/2024  . Diabetic kidney evaluation - eGFR measurement  11/19/2024  . Colonoscopy  11/28/2025  . INFLUENZA VACCINE  Completed  . Hepatitis C Screening  Completed  . HIV Screening  Completed  . HPV VACCINES  Aged Out  . Zoster Vaccines- Shingrix   Discontinued      Review of Systems Patient reports no vision/hearing changes, anorexia, fever ,adenopathy, persistant/recurrent hoarseness, swallowing issues, chest pain, palpitations, edema, persistant/recurrent cough, hemoptysis, dyspnea (rest,exertional, paroxysmal nocturnal), gastrointestinal  bleeding (melena, rectal bleeding), abdominal pain, excessive heart burn, GU symptoms (dysuria, hematuria, voiding/incontinence issues) syncope, focal weakness, memory loss, numbness & tingling, skin/hair/nail changes, depression, abnormal bruising/bleeding, musculoskeletal symptoms/signs.   + anxiety- pt is waking in the middle of the night and starts worrying.  + gas- worse w/ higher dose of Ozempic      Objective:   Physical Exam General Appearance:    Alert, cooperative, no distress, appears stated age  Head:    Normocephalic, without obvious  abnormality, atraumatic  Eyes:    PERRL, conjunctiva/corneas clear, EOM's intact both eyes       Ears:    Normal TM's and external ear canals, both ears  Nose:   Nares normal, septum midline, mucosa normal, no drainage   or sinus tenderness  Throat:   Lips, mucosa, and tongue normal; teeth and gums normal  Neck:   Supple, symmetrical, trachea midline, no adenopathy;       thyroid :  No enlargement/tenderness/nodules  Back:     Symmetric, no curvature, ROM normal, no CVA tenderness  Lungs:     Clear to auscultation bilaterally, respirations unlabored  Chest wall:    No tenderness or deformity  Heart:    Regular rate and rhythm, S1 and S2 normal, no murmur, rub   or gallop  Abdomen:     Soft, non-tender, bowel sounds active all four quadrants,    no masses, no organomegaly  Genitalia:    deferred  Rectal:    Extremities:   Extremities normal, atraumatic, no cyanosis or edema  Pulses:   2+ and symmetric all extremities  Skin:   Skin color, texture, turgor normal, no rashes or lesions  Lymph nodes:   Cervical, supraclavicular, and axillary nodes normal  Neurologic:   CNII-XII intact. Normal strength, sensation and reflexes      throughout          Assessment & Plan:

## 2023-12-18 NOTE — Assessment & Plan Note (Signed)
 Chronic problem.  UTD on foot exam, eye exam, microalbumin.  Having some increased gas w/ Ozempic .  Encouraged OTC GasX and if sxs don't improve, we can talk about decreasing dose.  Pt expressed understanding and is in agreement w/ plan.

## 2023-12-19 ENCOUNTER — Encounter: Payer: Self-pay | Admitting: Family Medicine

## 2023-12-20 ENCOUNTER — Encounter (HOSPITAL_COMMUNITY): Payer: 59

## 2023-12-20 ENCOUNTER — Ambulatory Visit (HOSPITAL_COMMUNITY): Payer: BC Managed Care – PPO

## 2023-12-23 ENCOUNTER — Encounter (HOSPITAL_COMMUNITY)
Admission: RE | Admit: 2023-12-23 | Discharge: 2023-12-23 | Disposition: A | Discharge status: Home / Self Care | Payer: 59 | Source: Ambulatory Visit | Attending: Internal Medicine

## 2023-12-23 ENCOUNTER — Telehealth: Payer: Self-pay

## 2023-12-23 ENCOUNTER — Ambulatory Visit (HOSPITAL_COMMUNITY): Payer: BC Managed Care – PPO

## 2023-12-23 DIAGNOSIS — I5022 Chronic systolic (congestive) heart failure: Secondary | ICD-10-CM | POA: Diagnosis not present

## 2023-12-23 NOTE — Telephone Encounter (Signed)
 Pt has reviewed labs via MyChart

## 2023-12-23 NOTE — Telephone Encounter (Signed)
-----   Message from Laymon Priest sent at 12/19/2023  7:32 AM EST ----- A1C looks MUCH better at 7.4%!!  Creatinine and GFR (kidney function) are stable- also good news!  Your thyroid  has swung the other direction.  Your TSH is mildly elevated which typically means an underfunctioning thyroid  (hypo).  Thankfully for T3 and T4 levels are still in normal range so no medication needed at this time.  We will repeat this when you come back to ensure medication is not needed at that point.

## 2023-12-25 ENCOUNTER — Ambulatory Visit: Payer: BC Managed Care – PPO | Admitting: Cardiology

## 2023-12-25 ENCOUNTER — Encounter: Payer: Self-pay | Admitting: Cardiology

## 2023-12-25 ENCOUNTER — Encounter (HOSPITAL_COMMUNITY)
Admission: RE | Admit: 2023-12-25 | Discharge: 2023-12-25 | Disposition: A | Discharge status: Home / Self Care | Payer: 59 | Source: Ambulatory Visit | Attending: Internal Medicine

## 2023-12-25 ENCOUNTER — Ambulatory Visit (HOSPITAL_COMMUNITY): Payer: BC Managed Care – PPO

## 2023-12-25 DIAGNOSIS — I5022 Chronic systolic (congestive) heart failure: Secondary | ICD-10-CM

## 2023-12-27 ENCOUNTER — Ambulatory Visit (HOSPITAL_COMMUNITY): Payer: BC Managed Care – PPO

## 2023-12-27 ENCOUNTER — Encounter (HOSPITAL_COMMUNITY)
Admission: RE | Admit: 2023-12-27 | Discharge: 2023-12-27 | Disposition: A | Discharge status: Home / Self Care | Payer: 59 | Source: Ambulatory Visit | Attending: Internal Medicine | Admitting: Internal Medicine

## 2023-12-27 DIAGNOSIS — I5022 Chronic systolic (congestive) heart failure: Secondary | ICD-10-CM

## 2023-12-27 NOTE — Progress Notes (Signed)
Reviewed home exercise Rx with patient today.  Encouraged warm-up, cool-down, and stretching. Reviewed THRR of 65 - 130 and keeping RPE between 11-13. Encouraged to hydrate with activity.  Reviewed weather parameters for temperature and humidity for safe exercise outdoors. Reviewed S/S to terminate exercise and when to call 911 vs MD. Reviewed the use of NTG and pt was encouraged to carry at all times. Pt encouraged to always carry a cell phone for safety when exercising outdoors. Pt verbalized understanding of the home exercise Rx and was provided a copy.   Lorin Picket MS, ACSM-CEP, CCRP

## 2023-12-30 ENCOUNTER — Encounter (HOSPITAL_COMMUNITY)
Admission: RE | Admit: 2023-12-30 | Discharge: 2023-12-30 | Disposition: A | Discharge status: Home / Self Care | Payer: 59 | Source: Ambulatory Visit | Attending: Internal Medicine

## 2023-12-30 ENCOUNTER — Other Ambulatory Visit: Payer: Self-pay | Admitting: Family Medicine

## 2023-12-30 ENCOUNTER — Ambulatory Visit (HOSPITAL_COMMUNITY): Payer: BC Managed Care – PPO

## 2023-12-30 ENCOUNTER — Other Ambulatory Visit: Payer: Self-pay | Admitting: Student

## 2023-12-30 DIAGNOSIS — I5022 Chronic systolic (congestive) heart failure: Secondary | ICD-10-CM

## 2024-01-01 ENCOUNTER — Ambulatory Visit (HOSPITAL_COMMUNITY): Payer: BC Managed Care – PPO

## 2024-01-01 ENCOUNTER — Encounter (HOSPITAL_COMMUNITY): Payer: 59

## 2024-01-01 NOTE — Progress Notes (Signed)
 Remote ICD transmission.

## 2024-01-03 ENCOUNTER — Encounter (HOSPITAL_COMMUNITY): Payer: 59

## 2024-01-03 ENCOUNTER — Ambulatory Visit (HOSPITAL_COMMUNITY): Payer: BC Managed Care – PPO

## 2024-01-06 ENCOUNTER — Encounter (HOSPITAL_COMMUNITY)
Admission: RE | Admit: 2024-01-06 | Discharge: 2024-01-06 | Disposition: A | Discharge status: Home / Self Care | Payer: 59 | Source: Ambulatory Visit | Attending: Internal Medicine

## 2024-01-06 ENCOUNTER — Ambulatory Visit (HOSPITAL_COMMUNITY): Payer: BC Managed Care – PPO

## 2024-01-06 DIAGNOSIS — I5022 Chronic systolic (congestive) heart failure: Secondary | ICD-10-CM | POA: Diagnosis not present

## 2024-01-08 ENCOUNTER — Encounter (HOSPITAL_COMMUNITY)
Admission: RE | Admit: 2024-01-08 | Discharge: 2024-01-08 | Disposition: A | Discharge status: Home / Self Care | Payer: 59 | Source: Ambulatory Visit | Attending: Internal Medicine

## 2024-01-08 ENCOUNTER — Ambulatory Visit (HOSPITAL_COMMUNITY): Payer: BC Managed Care – PPO

## 2024-01-08 ENCOUNTER — Encounter: Payer: Self-pay | Admitting: Internal Medicine

## 2024-01-08 ENCOUNTER — Other Ambulatory Visit: Payer: Self-pay | Admitting: Physician Assistant

## 2024-01-08 DIAGNOSIS — I5022 Chronic systolic (congestive) heart failure: Secondary | ICD-10-CM | POA: Diagnosis not present

## 2024-01-09 ENCOUNTER — Ambulatory Visit
Admission: RE | Admit: 2024-01-09 | Discharge: 2024-01-09 | Disposition: A | Discharge status: Home / Self Care | Payer: 59 | Source: Ambulatory Visit | Attending: Emergency Medicine | Admitting: Emergency Medicine

## 2024-01-09 DIAGNOSIS — R911 Solitary pulmonary nodule: Secondary | ICD-10-CM

## 2024-01-09 NOTE — Progress Notes (Signed)
 Electrophysiology Office Note:   Date:  01/11/2024  ID:  Jason Floyd, DOB 12-25-1965, MRN [id number]  Primary Cardiologist: None Electrophysiologist: Nobie Putnam, MD      History of Present Illness:   Jason Floyd is a 58 y.o. male with h/o DM, chronic back pain, former smoker/poly substance abuse, NICM that had recovered >> CAD w/ICM s/p ICD (08/2020), chronic CHF, HTN, HLD, PE, DM, hx of ?TIA, atrial fibrillation and VT c/b device shocks who is seen today for routine electrophysiology followup. Patient was admitted on 07/17/2023 for device shocks.  He had had at least 3 device shocks in the 1 to 2 days leading up to his hospitalization.  Device interrogation showed dual tachycardia with both atrial fibrillation, which began on September 1 and subsequent development of fast ventricular tachycardia.  He had been on amiodarone for many years up with good control, but this was discontinued January 2024 because of his young age and history of hypothyroidism.  Amiodarone was restarted during his hospitalization in September. Patient underwent catheter ablation for atrial fibrillation on 10/04/23.  Discussed the use of AI scribe software for clinical note transcription with the patient, who gave verbal consent to proceed.  History of Present Illness   [name], a patient with a history of atrial fibrillation (AFib) and ventricular tachycardia (VT), managed with an implantable cardioverter-defibrillator (ICD) and amiodarone, presents for 3 month follow-up after catheter ablation for AF. He had done well since ablation, with no known recurrence. Two days ago, the patient reports an episode of palpitations feeling as if he was going to pass out  and possibly shocked by his device. This episode correlated with VT on his device. He received ATP which sped up the VT and the device began charging for shock; however, the VT terminated on its own before a shock was delivered. The patient  denies any warning signs or precipitating factors for the episode. He had been feeling well, with no symptoms of HF. He has been participating in intensive cardiac rehab three times a week. The patient reports occasional episodes of systolic blood pressure dipping below 100, but no other significant symptoms.     Review of systems complete and found to be negative unless listed in HPI.   EP Information / Studies Reviewed:    EKG is not ordered today. EKG from 11/01/23 reviewed which showed normal sinus rhythm with PR and QRS .     Echo 08/15/23:  LV function moderately decreased.  LVEF 30 to 35%.  Global hypokinesis.  Mild concentric LVH.  Grade 1 diastolic dysfunction. Normal RV size and function. Mildly dilated left atrium.  Right atrium is normal in size. No significant valvular disease.  MRI 08/02/20:  IMPRESSION: 1. Normal LV size with mild LVH. EF 26% with wall motion abnormalities as noted above.   2.  Normal RV size with mildly decreased systolic function, EF 43%.   3. The delayed enhancement pattern is difficult. It could be suggestive of a very large, wrap-around LAD infarction with extensive no-reflow affecting the subendocardium. However, the mid-wall and subepicardial location of the LGE looks more like a viral myocarditis-type picture to me. If it is indeed LAD infarction with extensive no-reflow, the affected segments would have >50% wall thickness no-reflow + LGE, so would be unlikely to improve with revascularization.  Risk Assessment/Calculations:    CHA2DS2-VASc Score = 6   This indicates a 9.7% annual risk of stroke. The patient's score is based upon: CHF History: 1  HTN History: 1 Diabetes History: 1 Stroke History: 2 (TIA) Vascular Disease History: 1 Age Score: 0 Gender Score: 0             Physical Exam:   VS:  BP 100/68   Pulse 72   Ht 6\' 2"  (1.88 m)   Wt 235 lb (106.6 kg)   BMI 30.17 kg/m    Wt Readings from Last 3 Encounters:   01/10/24 235 lb (106.6 kg)  12/18/23 236 lb 4 oz (107.2 kg)  12/12/23 236 lb (107 kg)     GEN: Well nourished, well developed in no acute distress NECK: No JVD CARDIAC: Regular rate and rhythm, no murmurs, rubs, gallops RESPIRATORY:  Clear to auscultation without rales, wheezing or rhonchi  ABDOMEN: Soft, non-distended EXTREMITIES:  No edema; No deformity   ASSESSMENT AND PLAN:   Jason Floyd is a 58 y.o. male with h/o DM, chronic back pain, former smoker/poly substance abuse, NICM that had recovered >> CAD w/ICM s/p ICD (08/2020), chronic CHF, HTN, HLD, PE, DM, hx of ?TIA, atrial fibrillation and VT c/b device shocks who is seen today for routine electrophysiology followup.   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  However, his cardiac MRI also suggest a component of nonischemic scar. He had episode of VT on his ICD 2 days ago and received ATP, which sped up VT. Device was charging for shock but VT terminated before shock delivered.  -Continue amiodarone 200mg  once daily and carvedilol for now.  -We will add mexiletine 200mg  BID.  -Ultimately, I think patient would most benefit from catheter ablation for long-term VT management, specifically modification of large anterior wall scar. If successful, would potentially allow for discontinuation of amiodarone. We discussed risks of VT ablation as well as different options for catheter ablation such as RF here vs PFA at outside institution and he would like to think about this further.   #Dual chamber ICD in situ: Device interrogation performed today. Normal device function. Stable/appropriate lead parameters - sensing, threshold, impedance. No programming changes made.One episode of VT on 2/26 with ATP. -Continue remote interrogations.  #Persistent atrial fibrillation: S/p catheter ablation on 10/04/23. #Secondary hypercoagulable state due to atrial fibrillation:  - Continue amiodarone  200mg  once daily for now for VT. - Continue Eliquis.  #. High risk medication monitoring: On amiodarone. - Last TSH elevated at 7.27.Continue follow up with PCP for management.  - LFTs normal 12/18/23.   #CAD: No angina. -Continue aspirin, statin, and evolocumab.  #Chronic systolic heart failure: Well compensated. -He is on an excellent regimen of GDMT. -Continue follow up with Dr. [name].  Signed, Nobie Putnam, MD

## 2024-01-10 ENCOUNTER — Ambulatory Visit (HOSPITAL_COMMUNITY): Payer: BC Managed Care – PPO

## 2024-01-10 ENCOUNTER — Other Ambulatory Visit: Payer: Self-pay

## 2024-01-10 ENCOUNTER — Encounter: Payer: Self-pay | Admitting: Cardiology

## 2024-01-10 ENCOUNTER — Ambulatory Visit: Payer: 59 | Attending: Cardiology | Admitting: Cardiology

## 2024-01-10 ENCOUNTER — Encounter (HOSPITAL_COMMUNITY): Payer: 59

## 2024-01-10 VITALS — BP 100/68 | HR 72 | Ht 74.0 in | Wt 235.0 lb

## 2024-01-10 DIAGNOSIS — I472 Ventricular tachycardia, unspecified: Secondary | ICD-10-CM

## 2024-01-10 DIAGNOSIS — D6869 Other thrombophilia: Secondary | ICD-10-CM

## 2024-01-10 DIAGNOSIS — I5022 Chronic systolic (congestive) heart failure: Secondary | ICD-10-CM

## 2024-01-10 DIAGNOSIS — Z95810 Presence of automatic (implantable) cardiac defibrillator: Secondary | ICD-10-CM

## 2024-01-10 DIAGNOSIS — I4819 Other persistent atrial fibrillation: Secondary | ICD-10-CM

## 2024-01-10 DIAGNOSIS — Z79899 Other long term (current) drug therapy: Secondary | ICD-10-CM

## 2024-01-10 MED ORDER — MEXILETINE HCL 200 MG PO CAPS: 200.0000 mg | ORAL_CAPSULE | Freq: Two times a day (BID) | ORAL | 3 refills | Status: DC

## 2024-01-10 NOTE — Patient Instructions (Signed)
 Medication Instructions:  Your physician has recommended you make the following change in your medication:  1) START taking mexiletine 200 mg twice daily   *If you need a refill on your cardiac medications before your next appointment, please call your pharmacy*  Lab Work: TDOAY: CMET and CBC If you have labs (blood work) drawn today and your tests are completely normal, you will receive your results only by: MyChart Message (if you have MyChart) OR A paper copy in the mail If you have any lab test that is abnormal or we need to change your treatment, we will call you to review the results.  Follow-Up: At [facility], you and your health needs are our priority.  As part of our continuing mission to provide you with exceptional heart care, we have created designated Provider Care Teams.  These Care Teams include your primary Cardiologist (physician) and Advanced Practice Providers (APPs -  Physician Assistants and Nurse Practitioners) who all work together to provide you with the care you need, when you need it.  Your next appointment:   3 months  Provider:   You may see Nobie Putnam, MD or one of the following Advanced Practice Providers on your designated Care Team:   Francis Dowse, [place] [address]" [place], [place] Sherie Don, NP Canary Brim, NP

## 2024-01-11 LAB — COMPREHENSIVE METABOLIC PANEL
ALT: 44 IU/L (ref 0–44)
AST: 25 IU/L (ref 0–40)
Albumin: 4.8 g/dL (ref 3.8–4.9)
Alkaline Phosphatase: 49 IU/L (ref 44–121)
BUN/Creatinine Ratio: 15 (ref 9–20)
BUN: 21 mg/dL (ref 6–24)
Bilirubin Total: 0.5 mg/dL (ref 0.0–1.2)
CO2: 23 mmol/L (ref 20–29)
Calcium: 10.1 mg/dL (ref 8.7–10.2)
Chloride: 95 mmol/L — ABNORMAL LOW (ref 96–106)
Creatinine, Ser: 1.36 mg/dL — ABNORMAL HIGH (ref 0.76–1.27)
Globulin, Total: 2.6 g/dL (ref 1.5–4.5)
Glucose: 158 mg/dL — ABNORMAL HIGH (ref 70–99)
Potassium: 5.2 mmol/L (ref 3.5–5.2)
Sodium: 135 mmol/L (ref 134–144)
Total Protein: 7.4 g/dL (ref 6.0–8.5)
eGFR: 60 mL/min/{1.73_m2} (ref >59)

## 2024-01-11 LAB — CBC
Hematocrit: 48.5 % (ref 37.5–51.0)
Hemoglobin: 16.2 g/dL (ref 13.0–17.7)
MCH: 32.1 pg (ref 26.6–33.0)
MCHC: 33.4 g/dL (ref 31.5–35.7)
MCV: 96 fL (ref 79–97)
Platelets: 295 10*3/uL (ref 150–450)
RBC: 5.05 x10E6/uL (ref 4.14–5.80)
RDW: 12.1 % (ref 11.6–15.4)
WBC: 10.5 10*3/uL (ref 3.4–10.8)

## 2024-01-12 ENCOUNTER — Encounter (HOSPITAL_COMMUNITY): Payer: Self-pay | Admitting: Internal Medicine

## 2024-01-13 ENCOUNTER — Encounter (HOSPITAL_COMMUNITY)
Admission: RE | Admit: 2024-01-13 | Discharge: 2024-01-13 | Disposition: A | Discharge status: Home / Self Care | Payer: 59 | Source: Ambulatory Visit | Attending: Internal Medicine | Admitting: Internal Medicine

## 2024-01-13 ENCOUNTER — Ambulatory Visit (HOSPITAL_COMMUNITY): Payer: BC Managed Care – PPO

## 2024-01-13 DIAGNOSIS — I5022 Chronic systolic (congestive) heart failure: Secondary | ICD-10-CM | POA: Diagnosis present

## 2024-01-13 DIAGNOSIS — I5042 Chronic combined systolic (congestive) and diastolic (congestive) heart failure: Secondary | ICD-10-CM | POA: Insufficient documentation

## 2024-01-13 DIAGNOSIS — I480 Paroxysmal atrial fibrillation: Secondary | ICD-10-CM | POA: Insufficient documentation

## 2024-01-13 DIAGNOSIS — Z5189 Encounter for other specified aftercare: Secondary | ICD-10-CM | POA: Diagnosis not present

## 2024-01-13 NOTE — Progress Notes (Signed)
 Patient says his ICD almost went of last week . Noted that he saw Dr Jimmey Ralph and has been started on Mexitil 200 mg twice a day. Blood pressure 104/64. Telemetry rhythm Sinus with PVC's. Will consult with onsite provider about proceeding with exercise. Onsite provider Bernadene Person NP said that Kenric is okay to proceed with exercise. Thayer Headings RN BSN

## 2024-01-14 NOTE — Progress Notes (Signed)
 Cardiac Individual Treatment Plan  Patient Details  Name: Jason Floyd Floyd MRN: [id number] Date of Birth: [birth year] Referring Provider:   Flowsheet Row INTENSIVE CARDIAC REHAB ORIENT from 12/04/2023 in [facility]  Referring Provider Arvilla Meres, MD       Initial Encounter Date:  Flowsheet Row INTENSIVE CARDIAC REHAB ORIENT from 12/04/2023 in [facility]  Date 12/04/23       Visit Diagnosis: Heart failure, chronic systolic (HCC)  Patient's Home Medications on Admission:  Current Outpatient Medications:    allopurinol (ZYLOPRIM) 300 MG tablet, TAKE 2 TABLETS (600 MG TOTAL) BY MOUTH DAILY., Disp: 60 tablet, Rfl: 6   amiodarone (PACERONE) 200 MG tablet, TAKE 1 TABLET (200 MG TOTAL) BY MOUTH DAILY., Disp: 90 tablet, Rfl: 2   aspirin EC 81 MG tablet, Take 81 mg by mouth daily. Swallow whole., Disp: , Rfl:    atorvastatin (LIPITOR) 80 MG tablet, TAKE 1 TABLET BY MOUTH DAILY., Disp: 30 tablet, Rfl: 11   calcium carbonate (TUMS - DOSED IN MG ELEMENTAL CALCIUM) 500 MG chewable tablet, Chew 2-3 tablets by mouth daily as needed for indigestion or heartburn., Disp: , Rfl:    carvedilol (COREG) 3.125 MG tablet, TAKE 1 TABLET BY MOUTH 2 TIMES DAILY., Disp: 180 tablet, Rfl: 3   cetirizine (ZYRTEC) 10 MG tablet, Take 10 mg by mouth daily., Disp: , Rfl:    cyclobenzaprine (FLEXERIL) 10 MG tablet, TAKE 1 TABLET BY MOUTH 3 TIMES DAILY AS NEEDED FOR MUSCLE SPASMS., Disp: 45 tablet, Rfl: 1   ELIQUIS 5 MG TABS tablet, TAKE 1 TABLET BY MOUTH 2 TIMES A DAY, Disp: 60 tablet, Rfl: 11   Evolocumab (REPATHA SURECLICK) 140 MG/ML SOAJ, Inject 140 mg into the skin every 14 (fourteen) days., Disp: 2 mL, Rfl: 11   FARXIGA 10 MG TABS tablet, TAKE 1 TABLET BY MOUTH DAILY BEFORE BREAKFAST., Disp: 90 tablet, Rfl: 1   fenofibrate 160 MG tablet, TAKE 1 TABLET BY MOUTH DAILY., Disp: 90 tablet,  Rfl: 1   furosemide (LASIX) 20 MG tablet, Take 1 tablet by mouth daily as needed for swelling or a weight gain of 3 pounds or more in 24 hours or 5 pounds in one week., Disp: 30 tablet, Rfl: 5   gabapentin (NEURONTIN) 100 MG capsule, Take 100 mg by mouth 3 (three) times daily., Disp: , Rfl:    magnesium oxide (MAG-OX) 400 MG tablet, Take 1 tab by mouth Twice daily, Disp: 60 tablet, Rfl: 12   methocarbamol (ROBAXIN) 500 MG tablet, TAKE 1 TABLET BY MOUTH EVERY 6 HOURS AS NEEDED FOR MUSCLE SPASMS., Disp: 90 tablet, Rfl: 0   mexiletine (MEXITIL) 200 MG capsule, Take 1 capsule (200 mg total) by mouth 2 (two) times daily., Disp: 180 capsule, Rfl: 3   Naphazoline HCl (CLEAR EYES OP), Place 1 drop into both eyes daily as needed (sore eyes)., Disp: , Rfl:    pantoprazole (PROTONIX) 40 MG tablet, TAKE 1 TABLET BY MOUTH DAILY., Disp: 30 tablet, Rfl: 3   potassium chloride SA (KLOR-CON M20) 20 MEQ tablet, Take 1 tablet by mouth when taking Lasix, Disp: 30 tablet, Rfl: 5   sacubitril-valsartan (ENTRESTO) 49-51 MG, Take 1 tablet by mouth 2 (two) times daily., Disp: 60 tablet, Rfl: 3   Semaglutide, 2 MG/DOSE, (OZEMPIC, 2 MG/DOSE,) 8 MG/3ML SOPN, Inject 2 mg into the skin once a week. (Patient taking differently: Inject 2 mg into the skin  once a week. Wednesdays), Disp: 3 mL, Rfl: 11   sodium chloride (OCEAN) 0.65 % SOLN nasal spray, Place 1 spray into both nostrils as needed (dryness)., Disp: , Rfl:    spironolactone (ALDACTONE) 25 MG tablet, Take 1 tablet (25 mg total) by mouth at bedtime., Disp: 30 tablet, Rfl: 6   traZODone (DESYREL) 100 MG tablet, TAKE 1 TABLET (100 MG TOTAL) BY MOUTH AT BEDTIME., Disp: 90 tablet, Rfl: 1   triamcinolone (KENALOG) 0.025 % ointment, Apply 1 Application topically daily as needed (Dry skin)., Disp: , Rfl:    VASCEPA 1 g capsule, TAKE 2 CAPSULES BY MOUTH 2 TIMES DAILY., Disp: 120 capsule, Rfl: 3  Past Medical History: Past Medical History:  Diagnosis Date   AICD (automatic  cardioverter/defibrillator) present    Allergy    Arthritis    Atrial fibrillation (HCC)    CHF (congestive heart failure) (HCC)    CHF (congestive heart failure) (HCC)    Diabetes mellitus without complication (HCC)    Drug abuse (HCC)    Eczema    GERD (gastroesophageal reflux disease)    Gout    Heart attack (HCC) 06/10/2020   History of kidney stones    Hyperlipidemia    Hypertension    MI (myocardial infarction) (HCC)    PE (pulmonary embolism)    TIA (transient ischemic attack)    hx of per pt - pt not aware he had not followed by a neurologist   Ventricular tachycardia (HCC)     Tobacco Use: Social History   Tobacco Use  Smoking Status Former   Current packs/day: 0.00   Average packs/day: 0.3 packs/day for 31.0 years (7.8 ttl pk-yrs)   Types: Cigarettes   Quit date: 07/15/2023   Years since quitting: 0.5  Smokeless Tobacco Never  Tobacco Comments   Former smoker 11/01/23    Labs: Review Flowsheet  More data exists      Latest Ref Rng & Units 06/07/2022 07/25/2022 05/29/2023 07/05/2023 12/18/2023  Labs for ITP Cardiac and Pulmonary Rehab  Cholestrol 0 - 200 mg/dL - 87  88  - 96   LDL (calc) 0 - 99 mg/dL - 13  15  - 28   HDL-C >39.00 mg/dL - 41.93  37  - 79.02   Trlycerides 0.0 - 149.0 mg/dL - 409.7  353  - 29.9   Hemoglobin A1c 4.6 - 6.5 % 7.0  - - 8.5  7.4     Capillary Blood Glucose: Lab Results  Component Value Date   GLUCAP 151 (H) 12/11/2023   GLUCAP 134 (H) 12/11/2023   GLUCAP 142 (H) 12/09/2023   GLUCAP 157 (H) 12/09/2023   GLUCAP 164 (H) 12/04/2023     Exercise Target Goals: Exercise Program Goal: Individual exercise prescription set using results from initial 6 min walk test and THRR while considering  patient's activity barriers and safety.   Exercise Prescription Goal: Initial exercise prescription builds to 30-45 minutes a day of aerobic activity, 2-3 days per week.  Home exercise guidelines will be given to patient during program as part of  exercise prescription that the participant will acknowledge.  Activity Barriers & Risk Stratification:  Activity Barriers & Cardiac Risk Stratification - 12/04/23 1411       Activity Barriers & Cardiac Risk Stratification   Activity Barriers Right Knee Replacement;Decreased Ventricular Function;Deconditioning;Neck/Spine Problems    Cardiac Risk Stratification High   <5 METs on            6 Minute Walk:  6 Minute Walk     Row Name 12/04/23 1518         6 Minute Walk   Phase Initial     Distance 1620 feet     Walk Time 6 minutes     # of Rest Breaks 0     MPH 3.07     METS 3.94     RPE 9     Perceived Dyspnea  0     VO2 Peak 13.8     Symptoms Yes (comment)     Comments shin tightness- resolved with rest     Resting HR 72 bpm     Resting BP 104/62     Resting Oxygen Saturation  98 %     Exercise Oxygen Saturation  during 6 min walk 98 %     Max Ex. HR 107 bpm     Max Ex. BP 120/70     2 Minute Post BP 108/72              Oxygen Initial Assessment:   Oxygen Re-Evaluation:   Oxygen Discharge (Final Oxygen Re-Evaluation):   Initial Exercise Prescription:  Initial Exercise Prescription - 12/04/23 1500       Date of Initial Exercise RX and Referring Provider   Date 12/04/23    Referring Provider Arvilla Meres, MD    Expected Discharge Date 02/26/24      Treadmill   MPH 2.8    Grade 0    Minutes 15    METs 3.5      Bike   Level 1    Watts 60    Minutes 15    METs 3      Prescription Details   Frequency (times per week) 3    Duration Progress to 30 minutes of continuous aerobic without signs/symptoms of physical distress      Intensity   THRR 40-80% of Max Heartrate 65-130    Ratings of Perceived Exertion 11-13    Perceived Dyspnea 0-4      Progression   Progression Continue progressive overload as per policy without signs/symptoms or physical distress.      Resistance Training   Training Prescription Yes    Weight 4    Reps  10-15             Perform Capillary Blood Glucose checks as needed.  Exercise Prescription Changes:   Exercise Prescription Changes     Row Name 12/09/23 1400 12/24/23 1100 12/27/23 1400 01/08/24 1430       Response to Exercise   Blood Pressure (Admit) 104/60 106/64 105/70 98/60    Blood Pressure (Exercise) 130/78 112/68 124/68 --    Blood Pressure (Exit) 105/70 96/56 100/60 102/70    Heart Rate (Admit) 79 bpm 69 bpm 76 bpm 80 bpm    Heart Rate (Exercise) 107 bpm 93 bpm 98 bpm 99 bpm    Heart Rate (Exit) 83 bpm 68 bpm 85 bpm 89 bpm    Rating of Perceived Exertion (Exercise) 11.5 12 11 12     Symptoms None None None None    Comments Pt's first day in the [facility]2 program Reviewed METs Reviewed Home exercise Reviewed METs and goals    Duration Continue with 30 min of aerobic exercise without signs/symptoms of physical distress. Continue with 30 min of aerobic exercise without signs/symptoms of physical distress. Continue with 30 min of aerobic exercise without signs/symptoms of physical distress. Continue with 30 min of aerobic exercise without signs/symptoms of  physical distress.    Intensity THRR unchanged THRR unchanged THRR unchanged THRR unchanged      Progression   Progression Continue to progress workloads to maintain intensity without signs/symptoms of physical distress. Continue to progress workloads to maintain intensity without signs/symptoms of physical distress. Continue to progress workloads to maintain intensity without signs/symptoms of physical distress. Continue to progress workloads to maintain intensity without signs/symptoms of physical distress.    Average METs 2.75 3.1 3.4 3.4      Resistance Training   Training Prescription Yes Yes Yes No    Weight 4 4 lbs 4 lbs No weights on wednesdays    Reps 10-15 10-15 10-15 --    Time 10 Minutes 10 Minutes 10 Minutes --      Interval Training   Interval Training No No No No      Treadmill   MPH 2.8 2.8 2.8 2.8  not  correct on media should be 2.8/1.0    Grade 0 0 0 1    Minutes 15 15 15 15     METs 3.14 3.14 3.14 3.53      Bike   Level 1 2.5 2.5 3    Watts 17 37 39 40    Minutes 15 15 15 15     METs 2.4 3.1 3.6 3.3      Home Exercise Plan   Plans to continue exercise at -- -- Home (comment) Home (comment)    Frequency -- -- Add 2 additional days to program exercise sessions. Add 2 additional days to program exercise sessions.    Initial Home Exercises Provided -- -- 12/27/23 12/27/23             Exercise Comments:   Exercise Comments     Row Name 12/09/23 1441 12/23/23 1500 12/23/23 1603 12/27/23 1431 01/08/24 1400   Exercise Comments Pt's first day in the [facility] program. Pt exercised without complaints. -- Reviewed METs today. Pt METs are stagnent. Increased workload on bike today. Reviewed home exercise Rx. Pt will walk on his treadmill at home. Pt instucted not to do more than waht he is doing in the CRP2 program. Pt will work up to 30 minutes on the treadmill and will exercise 2x/week in addtion to the CRP2 program. Pt verbalized understanding of the home exercisse Rx and was provided a copy. Reviewed METs and goals. Pt agress to increase workloads on treadmill and bike today. Pt is making progress on goals and MET level.            Exercise Goals and Review:   Exercise Goals     Row Name 12/04/23 1411             Exercise Goals   Increase Physical Activity Yes       Intervention Develop an individualized exercise prescription for aerobic and resistive training based on initial evaluation findings, risk stratification, comorbidities and participant's personal goals.;Provide advice, education, support and counseling about physical activity/exercise needs.       Expected Outcomes Short Term: Attend rehab on a regular basis to increase amount of physical activity.;Long Term: Exercising regularly at least 3-5 days a week.;Long Term: Add in home exercise to make exercise part of  routine and to increase amount of physical activity.       Increase Strength and Stamina Yes       Intervention Provide advice, education, support and counseling about physical activity/exercise needs.;Develop an individualized exercise prescription for aerobic and resistive training based on initial evaluation  findings, risk stratification, comorbidities and participant's personal goals.       Expected Outcomes Short Term: Increase workloads from initial exercise prescription for resistance, speed, and METs.;Short Term: Perform resistance training exercises routinely during rehab and add in resistance training at home;Long Term: Improve cardiorespiratory fitness, muscular endurance and strength as measured by increased METs and functional capacity ( )       Able to understand and use rate of perceived exertion (RPE) scale Yes       Intervention Provide education and explanation on how to use RPE scale       Expected Outcomes Short Term: Able to use RPE daily in rehab to express subjective intensity level;Long Term:  Able to use RPE to guide intensity level when exercising independently       Knowledge and understanding of Target Heart Rate Range (THRR) Yes       Intervention Provide education and explanation of THRR including how the numbers were predicted and where they are located for reference       Expected Outcomes Short Term: Able to state/look up THRR;Short Term: Able to use daily as guideline for intensity in rehab;Long Term: Able to use THRR to govern intensity when exercising independently       Understanding of Exercise Prescription Yes       Intervention Provide education, explanation, and written materials on patient's individual exercise prescription       Expected Outcomes Short Term: Able to explain program exercise prescription;Long Term: Able to explain home exercise prescription to exercise independently                Exercise Goals Re-Evaluation :  Exercise Goals  Re-Evaluation     Row Name 12/09/23 1436 01/08/24 1430           Exercise Goal Re-Evaluation   Exercise Goals Review Increase Physical Activity;Understanding of Exercise Prescription;Increase Strength and Stamina;Knowledge and understanding of Target Heart Rate Range (THRR);Able to understand and use rate of perceived exertion (RPE) scale Increase Physical Activity;Understanding of Exercise Prescription;Increase Strength and Stamina;Knowledge and understanding of Target Heart Rate Range (THRR);Able to understand and use rate of perceived exertion (RPE) scale      Comments Pt's first day in the CRP2 program. Pt understands the exericse Rx, RPE sclae and THRR. Reviewed METs and goals. Pt voices that his strength and stamina is improving. Pt is doing better with being consistent with an exercise rouitne by attending the CRP2 program. However, pt has not started on his home exercise due to work issues. Encouraged patient to try and schedule 30 minutes 2x/week for his home exercise. Pt agrees to try to begin his home program.      Expected Outcomes Will continue to monitor patient and progress exercise workloads as tolerated. Will continue to monitor patient and progress exercise workloads as tolerated.               Discharge Exercise Prescription (Final Exercise Prescription Changes):  Exercise Prescription Changes - 01/08/24 1430       Response to Exercise   Blood Pressure (Admit) 98/60    Blood Pressure (Exit) 102/70    Heart Rate (Admit) 80 bpm    Heart Rate (Exercise) 99 bpm    Heart Rate (Exit) 89 bpm    Rating of Perceived Exertion (Exercise) 12    Symptoms None    Comments Reviewed METs and goals    Duration Continue with 30 min of aerobic exercise without signs/symptoms of physical distress.  Intensity THRR unchanged      Progression   Progression Continue to progress workloads to maintain intensity without signs/symptoms of physical distress.    Average METs 3.4       Resistance Training   Training Prescription No    Weight No weights on wednesdays      Interval Training   Interval Training No      Treadmill   MPH 2.8   not correct on media should be 2.8/1.0   Grade 1    Minutes 15    METs 3.53      Bike   Level 3    Watts 40    Minutes 15    METs 3.3      Home Exercise Plan   Plans to continue exercise at Home (comment)    Frequency Add 2 additional days to program exercise sessions.    Initial Home Exercises Provided 12/27/23             Nutrition:  Target Goals: Understanding of nutrition guidelines, daily intake of sodium 1500mg , cholesterol 200mg , calories 30% from fat and 7% or less from saturated fats, daily to have 5 or more servings of fruits and vegetables.  Biometrics:  Pre Biometrics - 12/04/23 1407       Pre Biometrics   Waist Circumference 48 inches    Hip Circumference 45 inches    Waist to Hip Ratio 1.07 %    Triceps Skinfold 24 mm    % Body Fat 33.3 %    Grip Strength 42 kg    Flexibility 13.25 in    Single Leg Stand 25 seconds              Nutrition Therapy Plan and Nutrition Goals:  Nutrition Therapy & Goals - 01/13/24 1449       Nutrition Therapy   Diet Heart Healthy/Carbohydrate Consistent diet    Drug/Food Interactions Statins/Certain Fruits      Personal Nutrition Goals   Nutrition Goal Patient to identify strategies for reducing cardiovascular risk by attending the [facility] education and nutrition series weekly.   goal in progress.   Personal Goal #2 Patient to improve diet quality by using the plate method as a guide for meal planning to include lean protein/plant protein, fruits, vegetables, whole grains, nonfat dairy as part of a well-balanced diet.   goal in progress.   Personal Goal #3 Patient to identify strategies for weight loss of 0.5-2.0# per week.   goal not met.   Comments Goals in progress. Patient has medical history of CHF, HTN, Hyperlipidemia, TIA, v-tach. A1c remains above  goal; he continue ozempic. He has lost 1.1# since starting with our program. Lipids remain at goal. He continues to attend the [facility] education and nutrition series regularly. Patient will benefit from participation in intensive cardiac rehab for nutrition, exercise, and lifestyle modification.      Intervention Plan   Intervention Prescribe, educate and counsel regarding individualized specific dietary modifications aiming towards targeted core components such as weight, hypertension, lipid management, diabetes, heart failure and other comorbidities.;Nutrition handout(s) given to patient.    Expected Outcomes Short Term Goal: Understand basic principles of dietary content, such as calories, fat, sodium, cholesterol and nutrients.;Long Term Goal: Adherence to prescribed nutrition plan.             Nutrition Assessments:  MEDIFICTS Score Key: >=70 Need to make dietary changes  40-70 Heart Healthy Diet <= 40 Therapeutic Level Cholesterol Diet   Flowsheet Row INTENSIVE  CARDIAC REHAB from 12/09/2023 in [facility]  Picture Your Plate Total Score on Admission 47      Picture Your Plate Scores: <29 Unhealthy dietary pattern with much room for improvement. 41-50 Dietary pattern unlikely to meet recommendations for good health and room for improvement. 51-60 More healthful dietary pattern, with some room for improvement.  >60 Healthy dietary pattern, although there may be some specific behaviors that could be improved.    Nutrition Goals Re-Evaluation:  Nutrition Goals Re-Evaluation     Row Name 01/13/24 1449             Goals   Current Weight 235 lb 7.2 oz (106.8 kg)       Comment Cr 1.36, LDL 28, lipids WNL, A1c 7.4       Expected Outcome Goals in progress. Patient has medical history of CHF, HTN, Hyperlipidemia, TIA, v-tach. A1c remains above goal; he continue ozempic. He has lost 1.1# since starting with our program.  Lipids remain at goal. He continues to attend the [facility] education and nutrition series regularly. Patient will benefit from participation in intensive cardiac rehab for nutrition, exercise, and lifestyle modification.                Nutrition Goals Re-Evaluation:  Nutrition Goals Re-Evaluation     Row Name 01/13/24 1449             Goals   Current Weight 235 lb 7.2 oz (106.8 kg)       Comment Cr 1.36, LDL 28, lipids WNL, A1c 7.4       Expected Outcome Goals in progress. Patient has medical history of CHF, HTN, Hyperlipidemia, TIA, v-tach. A1c remains above goal; he continue ozempic. He has lost 1.1# since starting with our program. Lipids remain at goal. He continues to attend the [facility] education and nutrition series regularly. Patient will benefit from participation in intensive cardiac rehab for nutrition, exercise, and lifestyle modification.                Nutrition Goals Discharge (Final Nutrition Goals Re-Evaluation):  Nutrition Goals Re-Evaluation - 01/13/24 1449       Goals   Current Weight 235 lb 7.2 oz (106.8 kg)    Comment Cr 1.36, LDL 28, lipids WNL, A1c 7.4    Expected Outcome Goals in progress. Patient has medical history of CHF, HTN, Hyperlipidemia, TIA, v-tach. A1c remains above goal; he continue ozempic. He has lost 1.1# since starting with our program. Lipids remain at goal. He continues to attend the [facility] education and nutrition series regularly. Patient will benefit from participation in intensive cardiac rehab for nutrition, exercise, and lifestyle modification.             Psychosocial: Target Goals: Acknowledge presence or absence of significant depression and/or stress, maximize coping skills, provide positive support system. Participant is able to verbalize types and ability to use techniques and skills needed for reducing stress and depression.  Initial Review & Psychosocial Screening:  Initial Psych Review & Screening - 12/04/23 1412        Initial Review   Current issues with None Identified      Family Dynamics   Good Support System? Yes   Wife for support     Barriers   Psychosocial barriers to participate in program There are no identifiable barriers or psychosocial needs.      Screening Interventions   Interventions Provide feedback about the scores to  participant;Encouraged to exercise    Expected Outcomes Long Term goal: The participant improves quality of Life and PHQ9 Scores as seen by post scores and/or verbalization of changes;Short Term goal: Identification and review with participant of any Quality of Life or Depression concerns found by scoring the questionnaire.             Quality of Life Scores:  Quality of Life - 12/04/23 1520       Quality of Life   Select Quality of Life      Quality of Life Scores   Health/Function Pre 18.97 %    Socioeconomic Pre 20.5 %    Psych/Spiritual Pre 20.25 %    Family Pre 22.5 %    GLOBAL Pre 19.98 %            Scores of 19 and below usually indicate a poorer quality of life in these areas.  A difference of  2-3 points is a clinically meaningful difference.  A difference of 2-3 points in the total score of the Quality of Life Index has been associated with significant improvement in overall quality of life, self-image, physical symptoms, and general health in studies assessing change in quality of life.  PHQ-9: Review Flowsheet  More data exists      12/18/2023 12/04/2023 07/05/2023 07/25/2022 01/23/2022  Depression screen PHQ 2/9  Decreased Interest 0 0 0 0 0  Down, Depressed, Hopeless 1 0 0 1 0  PHQ - 2 Score 1 0 0 1 0  Altered sleeping 1 0 1 0 1  Tired, decreased energy 0 1 1 0 0  Change in appetite 1 1 1 1 1   Feeling bad or failure about yourself  0 0 0 0 0  Trouble concentrating 1 0 0 0 1  Moving slowly or fidgety/restless 0 0 0 0 0  Suicidal thoughts 0 0 0 0 0  PHQ-9 Score 4 2 3 2 3   Difficult doing work/chores Somewhat difficult Not  difficult at all Not difficult at all Not difficult at all Not difficult at all   Interpretation of Total Score  Total Score Depression Severity:  1-4 = Minimal depression, 5-9 = Mild depression, 10-14 = Moderate depression, 15-19 = Moderately severe depression, 20-27 = Severe depression   Psychosocial Evaluation and Intervention:   Psychosocial Re-Evaluation:  Psychosocial Re-Evaluation     Row Name 12/10/23 0945 01/09/24 1541           Psychosocial Re-Evaluation   Current issues with None Identified None Identified      Comments Jason Floyd Floyd says sometimes he has a variation in his appetite due to being on ozempic. Jason Floyd has not voiced any increased concerns or stressors during exercise at cardiac rehab      Interventions Encouraged to attend Cardiac Rehabilitation for the exercise Encouraged to attend Cardiac Rehabilitation for the exercise      Continue Psychosocial Services  No Follow up required No Follow up required               Psychosocial Discharge (Final Psychosocial Re-Evaluation):  Psychosocial Re-Evaluation - 01/09/24 1541       Psychosocial Re-Evaluation   Current issues with None Identified    Comments Jason Floyd has not voiced any increased concerns or stressors during exercise at cardiac rehab    Interventions Encouraged to attend Cardiac Rehabilitation for the exercise    Continue Psychosocial Services  No Follow up required             Vocational  Rehabilitation: Provide vocational rehab assistance to qualifying candidates.   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 Participant will state understanding/return demonstration of topics  presented.    Education     Row Name 12/09/23 1500     Education   Cardiac Education Topics Pritikin   Select Workshops     Workshops   Educator Exercise Physiologist   Select Exercise   Exercise Workshop Managing Heart Disease: Your Path to a Healthier Heart   Instruction Review Code 1- Verbalizes Understanding   Class Start Time 1355   Class Stop Time 1445   Class Time Calculation (min) 50 min    Row Name 12/11/23 1500     Education   Cardiac Education Topics Pritikin   Orthoptist   Educator Dietitian   Weekly Topic Simple Sides and Sauces   Instruction Review Code 1- Verbalizes Understanding   Class Start Time 1145   Class Stop Time 1226   Class Time Calculation (min) 41 min    Row Name 12/16/23 1300     Education   Cardiac Education Topics Pritikin   Geographical information systems officer Psychosocial   Psychosocial Workshop From Head to Heart: The Power of a Healthy Outlook   Instruction Review Code 1- Verbalizes Understanding   Class Start Time 1400   Class Stop Time 1446   Class Time Calculation (min) 46 min    Row Name 12/18/23 1300     Education   Cardiac Education Topics Pritikin   Secondary school teacher School   Educator Nurse;Respiratory Therapist   Weekly Topic Powerhouse Plant-Based Proteins   Instruction Review Code 1- Verbalizes Understanding   Class Start Time 1150   Class Stop Time 1225   Class Time Calculation (min) 35 min    Row Name 12/23/23 1300     Education   Cardiac Education Topics Pritikin   Select Workshops     Workshops   Educator Exercise Physiologist   Select Exercise   Exercise Workshop Location manager and Fall Prevention   Instruction Review Code 1- Verbalizes Understanding   Class Start Time 1400   Class Stop Time 1445   Class Time Calculation (min) 45 min    Row Name 12/25/23 1400     Education   Cardiac Education Topics  Pritikin   Customer service manager   Weekly Topic Adding Flavor - Sodium-Free   Instruction Review Code 1- Verbalizes Understanding   Class Start Time 1145   Class Stop Time 1225   Class Time Calculation (min) 40 min    Row Name 12/27/23 1500     Education   Cardiac Education Topics Pritikin   Licensed conveyancer Nutrition   Nutrition Overview of the Pritikin Eating Plan   Instruction Review Code 1- Verbalizes Understanding   Class Start Time 1357   Class Stop Time 1444   Class Time Calculation (min) 47 min  Row Name 12/30/23 1400     Education   Cardiac Education Topics Pritikin   Nurse, children's Exercise Physiologist   Select Psychosocial   Psychosocial Healthy Minds, Bodies, Hearts   Instruction Review Code 1- Verbalizes Understanding   Class Start Time 1409   Class Stop Time 1443   Class Time Calculation (min) 34 min    Row Name 01/06/24 1400     Education   Cardiac Education Topics Pritikin   Select Core Videos     Core Videos   Educator Nurse   Select Nutrition   Nutrition Becoming a Pritikin Chef   Instruction Review Code 1- Verbalizes Understanding   Class Start Time 1401   Class Stop Time 1436   Class Time Calculation (min) 35 min    Row Name 01/08/24 1500     Education   Cardiac Education Topics Pritikin   Orthoptist   Educator Dietitian;Nurse   Weekly Topic Personalizing Your Pritikin Plate   Instruction Review Code 1- Verbalizes Understanding   Class Start Time 1400   Class Stop Time 1439   Class Time Calculation (min) 39 min    Row Name 01/13/24 1600     Education   Cardiac Education Topics Pritikin   Select Workshops     Workshops   Educator Exercise Physiologist   Select Psychosocial   Psychosocial Workshop Recognizing and Reducing Stress   Instruction Review Code 1- Verbalizes  Understanding   Class Start Time 1358   Class Stop Time 1445   Class Time Calculation (min) 47 min            Core Videos: Exercise    Move It!  Clinical staff conducted group or individual video education with verbal and written material and guidebook.  Patient learns the recommended Pritikin exercise program. Exercise with the goal of living a long, healthy life. Some of the health benefits of exercise include controlled diabetes, healthier blood pressure levels, improved cholesterol levels, improved heart and lung capacity, improved sleep, and better body composition. Everyone should speak with their doctor before starting or changing an exercise routine.  Biomechanical Limitations Clinical staff conducted group or individual video education with verbal and written material and guidebook.  Patient learns how biomechanical limitations can impact exercise and how we can mitigate and possibly overcome limitations to have an impactful and balanced exercise routine.  Body Composition Clinical staff conducted group or individual video education with verbal and written material and guidebook.  Patient learns that body composition (ratio of muscle mass to fat mass) is a key component to assessing overall fitness, rather than body weight alone. Increased fat mass, especially visceral belly fat, can put [place] at increased risk for metabolic syndrome, type 2 diabetes, heart disease, and even death. It is recommended to combine diet and exercise (cardiovascular and resistance training) to improve your body composition. Seek guidance from your physician and exercise physiologist before implementing an exercise routine.  Exercise Action Plan Clinical staff conducted group or individual video education with verbal and written material and guidebook.  Patient learns the recommended strategies to achieve and enjoy long-term exercise adherence, including variety, self-motivation, self-efficacy, and positive  decision making. Benefits of exercise include fitness, good health, weight management, more energy, better sleep, less stress, and overall well-being.  Medical   Heart Disease Risk Reduction Clinical staff conducted group or individual video education with verbal and written material  and guidebook.  Patient learns our heart is our most vital organ as it circulates oxygen, nutrients, white blood cells, and hormones throughout the entire body, and carries waste away. Data supports a plant-based eating plan like the Pritikin Program for its effectiveness in slowing progression of and reversing heart disease. The video provides a number of recommendations to address heart disease.   Metabolic Syndrome and Belly Fat  Clinical staff conducted group or individual video education with verbal and written material and guidebook.  Patient learns what metabolic syndrome is, how it leads to heart disease, and how one can reverse it and keep it from coming back. You have metabolic syndrome if you have 3 of the following 5 criteria: abdominal obesity, high blood pressure, high triglycerides, low HDL cholesterol, and high blood sugar.  Hypertension and Heart Disease Clinical staff conducted group or individual video education with verbal and written material and guidebook.  Patient learns that high blood pressure, or hypertension, is very common in the [place]. Hypertension is largely due to excessive salt intake, but other important risk factors include being overweight, physical inactivity, drinking too much alcohol, smoking, and not eating enough potassium from fruits and vegetables. High blood pressure is a leading risk factor for heart attack, stroke, congestive heart failure, dementia, kidney failure, and premature death. Long-term effects of excessive salt intake include stiffening of the arteries and thickening of heart muscle and organ damage. Recommendations include ways to reduce hypertension and the  risk of heart disease.  Diseases of Our Time - Focusing on Diabetes Clinical staff conducted group or individual video education with verbal and written material and guidebook.  Patient learns why the best way to stop diseases of our time is prevention, through food and other lifestyle changes. Medicine (such as prescription pills and surgeries) is often only a Band-Aid on the problem, not a long-term solution. Most common diseases of our time include obesity, type 2 diabetes, hypertension, heart disease, and cancer. The Pritikin Program is recommended and has been proven to help reduce, reverse, and/or prevent the damaging effects of metabolic syndrome.  Nutrition   Overview of the Pritikin Eating Plan  Clinical staff conducted group or individual video education with verbal and written material and guidebook.  Patient learns about the Pritikin Eating Plan for disease risk reduction. The Pritikin Eating Plan emphasizes a wide variety of unrefined, minimally-processed carbohydrates, like fruits, vegetables, whole grains, and legumes. Go, Caution, and Stop food choices are explained. Plant-based and lean animal proteins are emphasized. Rationale provided for low sodium intake for blood pressure control, low added sugars for blood sugar stabilization, and low added fats and oils for coronary artery disease risk reduction and weight management.  Calorie Density  Clinical staff conducted group or individual video education with verbal and written material and guidebook.  Patient learns about calorie density and how it impacts the Pritikin Eating Plan. Knowing the characteristics of the food you choose will help you decide whether those foods will lead to weight gain or weight loss, and whether you want to consume more or less of them. Weight loss is usually a side effect of the Pritikin Eating Plan because of its focus on low calorie-dense foods.  Label Reading  Clinical staff conducted group or  individual video education with verbal and written material and guidebook.  Patient learns about the Pritikin recommended label reading guidelines and corresponding recommendations regarding calorie density, added sugars, sodium content, and whole grains.  Dining Out - Part 1  Clinical staff conducted group or individual video education with verbal and written material and guidebook.  Patient learns that restaurant meals can be sabotaging because they can be so high in calories, fat, sodium, and/or sugar. Patient learns recommended strategies on how to positively address this and avoid unhealthy pitfalls.  Facts on Fats  Clinical staff conducted group or individual video education with verbal and written material and guidebook.  Patient learns that lifestyle modifications can be just as effective, if not more so, as many medications for lowering your risk of heart disease. A Pritikin lifestyle can help to reduce your risk of inflammation and atherosclerosis (cholesterol build-up, or plaque, in the artery walls). Lifestyle interventions such as dietary choices and physical activity address the cause of atherosclerosis. A review of the types of fats and their impact on blood cholesterol levels, along with dietary recommendations to reduce fat intake is also included.  Nutrition Action Plan  Clinical staff conducted group or individual video education with verbal and written material and guidebook.  Patient learns how to incorporate Pritikin recommendations into their lifestyle. Recommendations include planning and keeping personal health goals in mind as an important part of their success.  Healthy Mind-Set    Healthy Minds, Bodies, Hearts  Clinical staff conducted group or individual video education with verbal and written material and guidebook.  Patient learns how to identify when they are stressed. Video will discuss the impact of that stress, as well as the many benefits of stress management.  Patient will also be introduced to stress management techniques. The way we think, act, and feel has an impact on our hearts.  How Our Thoughts Can Heal Our Hearts  Clinical staff conducted group or individual video education with verbal and written material and guidebook.  Patient learns that negative thoughts can cause depression and anxiety. This can result in negative lifestyle behavior and serious health problems. Cognitive behavioral therapy is an effective method to help control our thoughts in order to change and improve our emotional outlook.  Additional Videos:  Exercise    Improving Performance  Clinical staff conducted group or individual video education with verbal and written material and guidebook.  Patient learns to use a non-linear approach by alternating intensity levels and lengths of time spent exercising to help burn more calories and lose more body fat. Cardiovascular exercise helps improve heart health, metabolism, hormonal balance, blood sugar control, and recovery from fatigue. Resistance training improves strength, endurance, balance, coordination, reaction time, metabolism, and muscle mass. Flexibility exercise improves circulation, posture, and balance. Seek guidance from your physician and exercise physiologist before implementing an exercise routine and learn your capabilities and proper form for all exercise.  Introduction to Yoga  Clinical staff conducted group or individual video education with verbal and written material and guidebook.  Patient learns about yoga, a discipline of the coming together of mind, breath, and body. The benefits of yoga include improved flexibility, improved range of motion, better posture and core strength, increased lung function, weight loss, and positive self-image. Yoga's heart health benefits include lowered blood pressure, healthier heart rate, decreased cholesterol and triglyceride levels, improved immune function, and reduced stress.  Seek guidance from your physician and exercise physiologist before implementing an exercise routine and learn your capabilities and proper form for all exercise.  Medical   Aging: Enhancing Your Quality of Life  Clinical staff conducted group or individual video education with verbal and written material and guidebook.  Patient learns key strategies and recommendations to stay  in good physical health and enhance quality of life, such as prevention strategies, having an advocate, securing a Health Care Proxy and Power of Attorney, and keeping a list of medications and system for tracking them. It also discusses how to avoid risk for bone loss.  Biology of Weight Control  Clinical staff conducted group or individual video education with verbal and written material and guidebook.  Patient learns that weight gain occurs because we consume more calories than we burn (eating more, moving less). Even if your body weight is normal, you may have higher ratios of fat compared to muscle mass. Too much body fat puts you at increased risk for cardiovascular disease, heart attack, stroke, type 2 diabetes, and obesity-related cancers. In addition to exercise, following the Pritikin Eating Plan can help reduce your risk.  Decoding Lab Results  Clinical staff conducted group or individual video education with verbal and written material and guidebook.  Patient learns that lab test reflects one measurement whose values change over time and are influenced by many factors, including medication, stress, sleep, exercise, food, hydration, pre-existing medical conditions, and more. It is recommended to use the knowledge from this video to become more involved with your lab results and evaluate your numbers to speak with your doctor.   Diseases of Our Time - Overview  Clinical staff conducted group or individual video education with verbal and written material and guidebook.  Patient learns that according to the CDC, 50%  to 70% of chronic diseases (such as obesity, type 2 diabetes, elevated lipids, hypertension, and heart disease) are avoidable through lifestyle improvements including healthier food choices, listening to satiety cues, and increased physical activity.  Sleep Disorders Clinical staff conducted group or individual video education with verbal and written material and guidebook.  Patient learns how good quality and duration of sleep are important to overall health and well-being. Patient also learns about sleep disorders and how they impact health along with recommendations to address them, including discussing with a physician.  Nutrition  Dining Out - Part 2 Clinical staff conducted group or individual video education with verbal and written material and guidebook.  Patient learns how to plan ahead and communicate in order to maximize their dining experience in a healthy and nutritious manner. Included are recommended food choices based on the type of restaurant the patient is visiting.   Fueling a Banker conducted group or individual video education with verbal and written material and guidebook.  There is a strong connection between our food choices and our health. Diseases like obesity and type 2 diabetes are very prevalent and are in large-part due to lifestyle choices. The Pritikin Eating Plan provides plenty of food and hunger-curbing satisfaction. It is easy to follow, affordable, and helps reduce health risks.  Menu Workshop  Clinical staff conducted group or individual video education with verbal and written material and guidebook.  Patient learns that restaurant meals can sabotage health goals because they are often packed with calories, fat, sodium, and sugar. Recommendations include strategies to plan ahead and to communicate with the manager, chef, or server to help order a healthier meal.  Planning Your Eating Strategy  Clinical staff conducted group or individual  video education with verbal and written material and guidebook.  Patient learns about the Pritikin Eating Plan and its benefit of reducing the risk of disease. The Pritikin Eating Plan does not focus on calories. Instead, it emphasizes high-quality, nutrient-rich foods. By knowing the characteristics of the foods,  we choose, we can determine their calorie density and make informed decisions.  Targeting Your Nutrition Priorities  Clinical staff conducted group or individual video education with verbal and written material and guidebook.  Patient learns that lifestyle habits have a tremendous impact on disease risk and progression. This video provides eating and physical activity recommendations based on your personal health goals, such as reducing LDL cholesterol, losing weight, preventing or controlling type 2 diabetes, and reducing high blood pressure.  Vitamins and Minerals  Clinical staff conducted group or individual video education with verbal and written material and guidebook.  Patient learns different ways to obtain key vitamins and minerals, including through a recommended healthy diet. It is important to discuss all supplements you take with your doctor.   Healthy Mind-Set    Smoking Cessation  Clinical staff conducted group or individual video education with verbal and written material and guidebook.  Patient learns that cigarette smoking and tobacco addiction pose a serious health risk which affects millions of people. Stopping smoking will significantly reduce the risk of heart disease, lung disease, and many forms of cancer. Recommended strategies for quitting are covered, including working with your doctor to develop a successful plan.  Culinary   Becoming a Set designer conducted group or individual video education with verbal and written material and guidebook.  Patient learns that cooking at home can be healthy, cost-effective, quick, and puts them in control.  Keys to cooking healthy recipes will include looking at your recipe, assessing your equipment needs, planning ahead, making it simple, choosing cost-effective seasonal ingredients, and limiting the use of added fats, salts, and sugars.  Cooking - Breakfast and Snacks  Clinical staff conducted group or individual video education with verbal and written material and guidebook.  Patient learns how important breakfast is to satiety and nutrition through the entire day. Recommendations include key foods to eat during breakfast to help stabilize blood sugar levels and to prevent overeating at meals later in the day. Planning ahead is also a key component.  Cooking - Educational psychologist conducted group or individual video education with verbal and written material and guidebook.  Patient learns eating strategies to improve overall health, including an approach to cook more at home. Recommendations include thinking of animal protein as a side on your plate rather than center stage and focusing instead on lower calorie dense options like vegetables, fruits, whole grains, and plant-based proteins, such as beans. Making sauces in large quantities to freeze for later and leaving the skin on your vegetables are also recommended to maximize your experience.  Cooking - Healthy Salads and Dressing Clinical staff conducted group or individual video education with verbal and written material and guidebook.  Patient learns that vegetables, fruits, whole grains, and legumes are the foundations of the Pritikin Eating Plan. Recommendations include how to incorporate each of these in flavorful and healthy salads, and how to create homemade salad dressings. Proper handling of ingredients is also covered. Cooking - Soups and [employer] - Soups and Desserts Clinical staff conducted group or individual video education with verbal and written material and guidebook.  Patient learns that Pritikin soups and  desserts make for easy, nutritious, and delicious snacks and meal components that are low in sodium, fat, sugar, and calorie density, while high in vitamins, minerals, and filling fiber. Recommendations include simple and healthy ideas for soups and desserts.   Overview     The Pritikin Solution Program Overview Clinical  staff conducted group or individual video education with verbal and written material and guidebook.  Patient learns that the results of the Pritikin Program have been documented in more than 100 articles published in peer-reviewed journals, and the benefits include reducing risk factors for (and, in some cases, even reversing) high cholesterol, high blood pressure, type 2 diabetes, obesity, and more! An overview of the three key pillars of the Pritikin Program will be covered: eating well, doing regular exercise, and having a healthy mind-set.  WORKSHOPS  Exercise: Exercise Basics: Building Your Action Plan Clinical staff led group instruction and group discussion with PowerPoint presentation and patient guidebook. To enhance the learning environment the use of posters, models and videos may be added. At the conclusion of this workshop, patients will comprehend the difference between physical activity and exercise, as well as the benefits of incorporating both, into their routine. Patients will understand the FITT (Frequency, Intensity, Time, and Type) principle and how to use it to build an exercise action plan. In addition, safety concerns and other considerations for exercise and cardiac rehab will be addressed by the presenter. The purpose of this lesson is to promote a comprehensive and effective weekly exercise routine in order to improve patients' overall level of fitness.   Managing Heart Disease: Your Path to a Healthier Heart Clinical staff led group instruction and group discussion with PowerPoint presentation and patient guidebook. To enhance the learning environment  the use of posters, models and videos may be added.At the conclusion of this workshop, patients will understand the anatomy and physiology of the heart. Additionally, they will understand how Pritikin's three pillars impact the risk factors, the progression, and the management of heart disease.  The purpose of this lesson is to provide a high-level overview of the heart, heart disease, and how the Pritikin lifestyle positively impacts risk factors.  Exercise Biomechanics Clinical staff led group instruction and group discussion with PowerPoint presentation and patient guidebook. To enhance the learning environment the use of posters, models and videos may be added. Patients will learn how the structural parts of their bodies function and how these functions impact their daily activities, movement, and exercise. Patients will learn how to promote a neutral spine, learn how to manage pain, and identify ways to improve their physical movement in order to promote healthy living. The purpose of this lesson is to expose patients to common physical limitations that impact physical activity. Participants will learn practical ways to adapt and manage aches and pains, and to minimize their effect on regular exercise. Patients will learn how to maintain good posture while sitting, walking, and lifting.  Balance Training and Fall Prevention  Clinical staff led group instruction and group discussion with PowerPoint presentation and patient guidebook. To enhance the learning environment the use of posters, models and videos may be added. At the conclusion of this workshop, patients will understand the importance of their sensorimotor skills (vision, proprioception, and the vestibular system) in maintaining their ability to balance as they age. Patients will apply a variety of balancing exercises that are appropriate for their current level of function. Patients will understand the common causes for  poor balance, possible solutions to these problems, and ways to modify their physical environment in order to minimize their fall risk. The purpose of this lesson is to teach patients about the importance of maintaining balance as they age and ways to minimize their risk of falling.  WORKSHOPS   Nutrition:  Fueling a Ship broker led  group instruction and group discussion with PowerPoint presentation and patient guidebook. To enhance the learning environment the use of posters, models and videos may be added. Patients will review the foundational principles of the Pritikin Eating Plan and understand what constitutes a serving size in each of the food groups. Patients will also learn Pritikin-friendly foods that are better choices when away from home and review make-ahead meal and snack options. Calorie density will be reviewed and applied to three nutrition priorities: weight maintenance, weight loss, and weight gain. The purpose of this lesson is to reinforce (in a group setting) the key concepts around what patients are recommended to eat and how to apply these guidelines when away from home by planning and selecting Pritikin-friendly options. Patients will understand how calorie density may be adjusted for different weight management goals.  Mindful Eating  Clinical staff led group instruction and group discussion with PowerPoint presentation and patient guidebook. To enhance the learning environment the use of posters, models and videos may be added. Patients will briefly review the concepts of the Pritikin Eating Plan and the importance of low-calorie dense foods. The concept of mindful eating will be introduced as well as the importance of paying attention to internal hunger signals. Triggers for non-hunger eating and techniques for dealing with triggers will be explored. The purpose of this lesson is to provide patients with the opportunity to review the basic principles of the  Pritikin Eating Plan, discuss the value of eating mindfully and how to measure internal cues of hunger and fullness using the Hunger Scale. Patients will also discuss reasons for non-hunger eating and learn strategies to use for controlling emotional eating.  Targeting Your Nutrition Priorities Clinical staff led group instruction and group discussion with PowerPoint presentation and patient guidebook. To enhance the learning environment the use of posters, models and videos may be added. Patients will learn how to determine their genetic susceptibility to disease by reviewing their family history. Patients will gain insight into the importance of diet as part of an overall healthy lifestyle in mitigating the impact of genetics and other environmental insults. The purpose of this lesson is to provide patients with the opportunity to assess their personal nutrition priorities by looking at their family history, their own health history and current risk factors. Patients will also be able to discuss ways of prioritizing and modifying the Pritikin Eating Plan for their highest risk areas  Menu  Clinical staff led group instruction and group discussion with PowerPoint presentation and patient guidebook. To enhance the learning environment the use of posters, models and videos may be added. Using menus brought in from [employer], or printed from [employer], patients will apply the Pritikin dining out guidelines that were presented in the [employer] video. Patients will also be able to practice these guidelines in a variety of provided scenarios. The purpose of this lesson is to provide patients with the opportunity to practice hands-on learning of the Pritikin Dining Out guidelines with actual menus and practice scenarios.  Label Reading Clinical staff led group instruction and group discussion with PowerPoint presentation and patient guidebook. To enhance the learning environment  the use of posters, models and videos may be added. Patients will review and discuss the Pritikin label reading guidelines presented in Pritikin's Label Reading Educational series video. Using fool labels brought in from local grocery stores and markets, patients will apply the label reading guidelines and determine if the packaged food meet the Pritikin guidelines. The purpose  of this lesson is to provide patients with the opportunity to review, discuss, and practice hands-on learning of the Pritikin Label Reading guidelines with actual packaged food labels. Cooking School  Pritikin's [employer] are designed to teach patients ways to prepare quick, simple, and affordable recipes at home. The importance of nutrition's role in chronic disease risk reduction is reflected in its emphasis in the overall Pritikin program. By learning how to prepare essential core Pritikin Eating Plan recipes, patients will increase control over what they eat; be able to customize the flavor of foods without the use of added salt, sugar, or fat; and improve the quality of the food they consume. By learning a set of core recipes which are easily assembled, quickly prepared, and affordable, patients are more likely to prepare more healthy foods at home. These workshops focus on convenient breakfasts, simple entres, side dishes, and desserts which can be prepared with minimal effort and are consistent with nutrition recommendations for cardiovascular risk reduction. Cooking [employer] are taught by a Armed forces logistics/support/administrative officer (RD) who has been trained by the [employer]. The chef or RD has a clear understanding of the importance of minimizing - if not completely eliminating - added fat, sugar, and sodium in recipes. Throughout the series of Cooking School Workshop sessions, patients will learn about healthy ingredients and efficient methods of cooking to build confidence in their capability to  prepare    Cooking School weekly topics:  Adding Flavor- Sodium-Free  Fast and Healthy Breakfasts  Powerhouse Plant-Based Proteins  Satisfying Salads and Dressings  Simple Sides and Sauces  International Cuisine-Spotlight on the [employer] Zones  Delicious Desserts  Savory Soups  [employer] - Meals in a Astronomer Appetizers and Snacks  Comforting Weekend Breakfasts  One-Pot Wonders   Fast Evening Meals  Landscape architect Your Pritikin Plate  WORKSHOPS   Healthy Mindset (Psychosocial):  Focused Goals, Sustainable Changes Clinical staff led group instruction and group discussion with PowerPoint presentation and patient guidebook. To enhance the learning environment the use of posters, models and videos may be added. Patients will be able to apply effective goal setting strategies to establish at least one personal goal, and then take consistent, meaningful action toward that goal. They will learn to identify common barriers to achieving personal goals and develop strategies to overcome them. Patients will also gain an understanding of how our mind-set can impact our ability to achieve goals and the importance of cultivating a positive and growth-oriented mind-set. The purpose of this lesson is to provide patients with a deeper understanding of how to set and achieve personal goals, as well as the tools and strategies needed to overcome common obstacles which may arise along the way.  From Head to Heart: The Power of a Healthy Outlook  Clinical staff led group instruction and group discussion with PowerPoint presentation and patient guidebook. To enhance the learning environment the use of posters, models and videos may be added. Patients will be able to recognize and describe the impact of emotions and mood on physical health. They will discover the importance of self-care and explore self-care practices which may work for them. Patients will also learn how to utilize  the 4 C's to cultivate a healthier outlook and better manage stress and challenges. The purpose of this lesson is to demonstrate to patients how a healthy outlook is an essential part of maintaining good health, especially as they continue their cardiac rehab journey.  Healthy  Sleep for a Healthy Heart Clinical staff led group instruction and group discussion with PowerPoint presentation and patient guidebook. To enhance the learning environment the use of posters, models and videos may be added. At the conclusion of this workshop, patients will be able to demonstrate knowledge of the importance of sleep to overall health, well-being, and quality of life. They will understand the symptoms of, and treatments for, common sleep disorders. Patients will also be able to identify daytime and nighttime behaviors which impact sleep, and they will be able to apply these tools to help manage sleep-related challenges. The purpose of this lesson is to provide patients with a general overview of sleep and outline the importance of quality sleep. Patients will learn about a few of the most common sleep disorders. Patients will also be introduced to the concept of "sleep hygiene," and discover ways to self-manage certain sleeping problems through simple daily behavior changes. Finally, the workshop will motivate patients by clarifying the links between quality sleep and their goals of heart-healthy living.   Recognizing and Reducing Stress Clinical staff led group instruction and group discussion with PowerPoint presentation and patient guidebook. To enhance the learning environment the use of posters, models and videos may be added. At the conclusion of this workshop, patients will be able to understand the types of stress reactions, differentiate between acute and chronic stress, and recognize the impact that chronic stress has on their health. They will also be able to apply different coping mechanisms, such as reframing  negative self-talk. Patients will have the opportunity to practice a variety of stress management techniques, such as deep abdominal breathing, progressive muscle relaxation, and/or guided imagery.  The purpose of this lesson is to educate patients on the role of stress in their lives and to provide healthy techniques for coping with it.  Learning Barriers/Preferences:  Learning Barriers/Preferences - 12/04/23 1412       Learning Barriers/Preferences   Learning Barriers Sight   reading glasses   Learning Preferences Audio;Computer/Internet;Group Instruction;Individual Instruction;Pictoral;Skilled Demonstration;Verbal Instruction;Video;Written Material             Education Topics:  Knowledge Questionnaire Score:  Knowledge Questionnaire Score - 12/04/23 1412       Knowledge Questionnaire Score   Pre Score 24/24             Core Components/Risk Factors/Patient Goals at Admission:  Personal Goals and Risk Factors at Admission - 12/04/23 1413       Core Components/Risk Factors/Patient Goals on Admission    Weight Management Yes;Obesity;Weight Loss    Intervention Weight Management: Develop a combined nutrition and exercise program designed to reach desired caloric intake, while maintaining appropriate intake of nutrient and fiber, sodium and fats, and appropriate energy expenditure required for the weight goal.;Weight Management: Provide education and appropriate resources to help participant work on and attain dietary goals.;Weight Management/Obesity: Establish reasonable short term and long term weight goals.;Obesity: Provide education and appropriate resources to help participant work on and attain dietary goals.    Expected Outcomes Short Term: Continue to assess and modify interventions until short term weight is achieved;Long Term: Adherence to nutrition and physical activity/exercise program aimed toward attainment of established weight goal;Weight Loss: Understanding of  general recommendations for a balanced deficit meal plan, which promotes 1-2 lb weight loss per week and includes a negative energy balance of [phone] kcal/d;Understanding recommendations for meals to include 15-35% energy as protein, 25-35% energy from fat, 35-60% energy from carbohydrates, less than 200mg  of dietary cholesterol,  20-35 gm of total fiber daily;Understanding of distribution of calorie intake throughout the day with the consumption of 4-5 meals/snacks    Diabetes Yes    Intervention Provide education about signs/symptoms and action to take for hypo/hyperglycemia.;Provide education about proper nutrition, including hydration, and aerobic/resistive exercise prescription along with prescribed medications to achieve blood glucose in normal ranges: Fasting glucose 65-99 mg/dL    Expected Outcomes Short Term: Participant verbalizes understanding of the signs/symptoms and immediate care of hyper/hypoglycemia, proper foot care and importance of medication, aerobic/resistive exercise and nutrition plan for blood glucose control.;Long Term: Attainment of HbA1C < 7%.    Heart Failure Yes    Intervention Provide a combined exercise and nutrition program that is supplemented with education, support and counseling about heart failure. Directed toward relieving symptoms such as shortness of breath, decreased exercise tolerance, and extremity edema.    Expected Outcomes Improve functional capacity of life;Short term: Attendance in program 2-3 days a week with increased exercise capacity. Reported lower sodium intake. Reported increased fruit and vegetable intake. Reports medication compliance.;Short term: Daily weights obtained and reported for increase. Utilizing diuretic protocols set by physician.;Long term: Adoption of self-care skills and reduction of barriers for early signs and symptoms recognition and intervention leading to self-care maintenance.    Hypertension Yes    Intervention Provide education  on lifestyle modifcations including regular physical activity/exercise, weight management, moderate sodium restriction and increased consumption of fresh fruit, vegetables, and low fat dairy, alcohol moderation, and smoking cessation.;Monitor prescription use compliance.    Expected Outcomes Short Term: Continued assessment and intervention until BP is < 140/68mm HG in hypertensive participants. < 130/34mm HG in hypertensive participants with diabetes, heart failure or chronic kidney disease.;Long Term: Maintenance of blood pressure at goal levels.    Lipids Yes    Intervention Provide education and support for participant on nutrition & aerobic/resistive exercise along with prescribed medications to achieve LDL 70mg , HDL >40mg .    Expected Outcomes Short Term: Participant states understanding of desired cholesterol values and is compliant with medications prescribed. Participant is following exercise prescription and nutrition guidelines.;Long Term: Cholesterol controlled with medications as prescribed, with individualized exercise RX and with personalized nutrition plan. Value goals: LDL < 70mg , HDL > 40 mg.             Core Components/Risk Factors/Patient Goals Review:   Goals and Risk Factor Review     Row Name 12/10/23 0951 12/13/23 0929 01/09/24 1543         Core Components/Risk Factors/Patient Goals Review   Personal Goals Review Weight Management/Obesity;Heart Failure;Lipids;Hypertension;Diabetes Weight Management/Obesity;Heart Failure;Lipids;Hypertension;Diabetes Weight Management/Obesity;Heart Failure;Lipids;Hypertension;Diabetes     Review Jason Floyd started cardiac rehab on 12/09/23. Jason Floyd did well with exercise. Vital signs and CBG's were stable. Jason Floyd Floyd does not check his CBG's at home. Spot checked CBG's at cardiac rehab. Jason Floyd Floyd started cardiac rehab on 12/09/23. Jason Floyd Floyd is off to a good start  with exercise. Vital signs and CBG's have been stable. Jason Floyd Floyd does not check his CBG's at  home. Spot checked CBG's at cardiac rehab. Jason Floyd has lost 1.5 kg since starting cardiac rehab. Jason Floyd Floyd is doing well with exercise at cardiac rehab. Vital signs have been stable.     Expected Outcomes Jason Floyd will continue to participate in cardiac rehab for exercise, nutrition and lifestyle modifications Jason Floyd will continue to participate in cardiac rehab for exercise, nutrition and lifestyle modifications Jason Floyd Floyd will continue to participate in cardiac rehab for exercise, nutrition and lifestyle modifications  Core Components/Risk Factors/Patient Goals at Discharge (Final Review):   Goals and Risk Factor Review - 01/09/24 1543       Core Components/Risk Factors/Patient Goals Review   Personal Goals Review Weight Management/Obesity;Heart Failure;Lipids;Hypertension;Diabetes    Review Jason Floyd is doing well with exercise at cardiac rehab. Vital signs have been stable.    Expected Outcomes Jason Floyd Floyd will continue to participate in cardiac rehab for exercise, nutrition and lifestyle modifications             ITP Comments:  ITP Comments     Row Name 12/04/23 1407 12/10/23 0942 12/13/23 0928 01/09/24 1540     ITP Comments Dr. Armanda Magic medical director. Introduction to pritikin education/intensive cardiac rehab. Initial orientation packet reviewed with patient. 30 Day ITP Review. Jason Floyd Floyd started cardiac rehab on 12/09/23. Jason Floyd Floyd did well with exercise. 30 Day ITP Review. Jason Floyd started cardiac rehab on 12/09/23. Jason Floyd Floyd is off to a good start  with exercise. 30 Day ITP Review. Jason Floyd has good attendance and participation in  cardiac rehab             Comments: See ITP comments.

## 2024-01-15 ENCOUNTER — Encounter (HOSPITAL_COMMUNITY)
Admission: RE | Admit: 2024-01-15 | Discharge: 2024-01-15 | Disposition: A | Discharge status: Home / Self Care | Payer: 59 | Source: Ambulatory Visit | Attending: Internal Medicine | Admitting: Internal Medicine

## 2024-01-15 ENCOUNTER — Ambulatory Visit (HOSPITAL_COMMUNITY): Payer: BC Managed Care – PPO

## 2024-01-15 ENCOUNTER — Ambulatory Visit: Payer: 59 | Admitting: Licensed Clinical Social Worker

## 2024-01-15 DIAGNOSIS — F419 Anxiety disorder, unspecified: Secondary | ICD-10-CM | POA: Diagnosis not present

## 2024-01-15 DIAGNOSIS — I5042 Chronic combined systolic (congestive) and diastolic (congestive) heart failure: Secondary | ICD-10-CM | POA: Diagnosis not present

## 2024-01-15 DIAGNOSIS — I5022 Chronic systolic (congestive) heart failure: Secondary | ICD-10-CM
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
## 2024-01-17 ENCOUNTER — Encounter (HOSPITAL_COMMUNITY)
Admission: RE | Admit: 2024-01-17 | Discharge: 2024-01-17 | Disposition: A | Discharge status: Home / Self Care | Payer: 59 | Source: Ambulatory Visit | Attending: Internal Medicine | Admitting: Internal Medicine

## 2024-01-17 ENCOUNTER — Ambulatory Visit (HOSPITAL_COMMUNITY): Payer: BC Managed Care – PPO

## 2024-01-17 DIAGNOSIS — I5042 Chronic combined systolic (congestive) and diastolic (congestive) heart failure: Secondary | ICD-10-CM | POA: Diagnosis not present

## 2024-01-17 DIAGNOSIS — I5022 Chronic systolic (congestive) heart failure: Secondary | ICD-10-CM

## 2024-01-20 ENCOUNTER — Encounter (HOSPITAL_COMMUNITY)
Admission: RE | Admit: 2024-01-20 | Discharge: 2024-01-20 | Disposition: A | Discharge status: Home / Self Care | Payer: 59 | Source: Ambulatory Visit | Attending: Internal Medicine

## 2024-01-20 ENCOUNTER — Ambulatory Visit (HOSPITAL_COMMUNITY): Payer: BC Managed Care – PPO

## 2024-01-20 DIAGNOSIS — I5042 Chronic combined systolic (congestive) and diastolic (congestive) heart failure: Secondary | ICD-10-CM | POA: Diagnosis not present

## 2024-01-20 DIAGNOSIS — I5022 Chronic systolic (congestive) heart failure: Secondary | ICD-10-CM

## 2024-01-22 ENCOUNTER — Ambulatory Visit (HOSPITAL_COMMUNITY): Payer: BC Managed Care – PPO

## 2024-01-22 ENCOUNTER — Encounter (HOSPITAL_COMMUNITY)
Admission: RE | Admit: 2024-01-22 | Discharge: 2024-01-22 | Disposition: A | Discharge status: Home / Self Care | Payer: 59 | Source: Ambulatory Visit | Attending: Internal Medicine | Admitting: Internal Medicine

## 2024-01-22 DIAGNOSIS — I5042 Chronic combined systolic (congestive) and diastolic (congestive) heart failure: Secondary | ICD-10-CM | POA: Diagnosis not present

## 2024-01-22 DIAGNOSIS — I5022 Chronic systolic (congestive) heart failure: Secondary | ICD-10-CM

## 2024-01-24 ENCOUNTER — Ambulatory Visit (HOSPITAL_COMMUNITY): Payer: BC Managed Care – PPO

## 2024-01-24 ENCOUNTER — Encounter (HOSPITAL_COMMUNITY): Payer: 59

## 2024-01-27 ENCOUNTER — Ambulatory Visit (HOSPITAL_COMMUNITY): Payer: BC Managed Care – PPO

## 2024-01-27 ENCOUNTER — Encounter (HOSPITAL_COMMUNITY)
Admission: RE | Admit: 2024-01-27 | Discharge: 2024-01-27 | Disposition: A | Discharge status: Home / Self Care | Payer: 59 | Source: Ambulatory Visit | Attending: Internal Medicine

## 2024-01-27 DIAGNOSIS — I5042 Chronic combined systolic (congestive) and diastolic (congestive) heart failure: Secondary | ICD-10-CM | POA: Diagnosis not present

## 2024-01-27 DIAGNOSIS — I5022 Chronic systolic (congestive) heart failure: Secondary | ICD-10-CM

## 2024-01-28 ENCOUNTER — Encounter: Payer: Self-pay | Admitting: Emergency Medicine

## 2024-01-28 ENCOUNTER — Other Ambulatory Visit: Payer: Self-pay | Admitting: Family Medicine

## 2024-01-29 ENCOUNTER — Encounter (HOSPITAL_COMMUNITY)
Admission: RE | Admit: 2024-01-29 | Discharge: 2024-01-29 | Disposition: A | Discharge status: Home / Self Care | Payer: 59 | Source: Ambulatory Visit | Attending: Internal Medicine

## 2024-01-29 ENCOUNTER — Ambulatory Visit: Admitting: Licensed Clinical Social Worker

## 2024-01-29 ENCOUNTER — Ambulatory Visit (HOSPITAL_COMMUNITY): Payer: BC Managed Care – PPO

## 2024-01-29 DIAGNOSIS — I5042 Chronic combined systolic (congestive) and diastolic (congestive) heart failure: Secondary | ICD-10-CM | POA: Diagnosis not present

## 2024-01-29 DIAGNOSIS — I5022 Chronic systolic (congestive) heart failure: Secondary | ICD-10-CM

## 2024-01-31 ENCOUNTER — Ambulatory Visit (HOSPITAL_COMMUNITY): Payer: BC Managed Care – PPO

## 2024-01-31 ENCOUNTER — Encounter (HOSPITAL_COMMUNITY)
Admission: RE | Admit: 2024-01-31 | Discharge: 2024-01-31 | Disposition: A | Discharge status: Home / Self Care | Payer: 59 | Source: Ambulatory Visit | Attending: Internal Medicine | Admitting: Internal Medicine

## 2024-01-31 DIAGNOSIS — I5022 Chronic systolic (congestive) heart failure: Secondary | ICD-10-CM

## 2024-01-31 DIAGNOSIS — I5042 Chronic combined systolic (congestive) and diastolic (congestive) heart failure: Secondary | ICD-10-CM | POA: Diagnosis not present

## 2024-02-03 ENCOUNTER — Ambulatory Visit (HOSPITAL_COMMUNITY): Payer: BC Managed Care – PPO

## 2024-02-03 ENCOUNTER — Encounter (HOSPITAL_COMMUNITY)
Admission: RE | Admit: 2024-02-03 | Discharge: 2024-02-03 | Disposition: A | Discharge status: Home / Self Care | Payer: 59 | Source: Ambulatory Visit | Attending: Internal Medicine

## 2024-02-03 DIAGNOSIS — I5042 Chronic combined systolic (congestive) and diastolic (congestive) heart failure: Secondary | ICD-10-CM | POA: Diagnosis not present

## 2024-02-03 DIAGNOSIS — I5022 Chronic systolic (congestive) heart failure: Secondary | ICD-10-CM

## 2024-02-05 ENCOUNTER — Ambulatory Visit (HOSPITAL_COMMUNITY): Payer: BC Managed Care – PPO

## 2024-02-05 ENCOUNTER — Encounter (HOSPITAL_COMMUNITY)
Admission: RE | Admit: 2024-02-05 | Discharge: 2024-02-05 | Disposition: A | Discharge status: Home / Self Care | Payer: 59 | Source: Ambulatory Visit | Attending: Internal Medicine | Admitting: Internal Medicine

## 2024-02-05 DIAGNOSIS — I5022 Chronic systolic (congestive) heart failure: Secondary | ICD-10-CM

## 2024-02-05 DIAGNOSIS — I5042 Chronic combined systolic (congestive) and diastolic (congestive) heart failure: Secondary | ICD-10-CM | POA: Diagnosis not present

## 2024-02-07 ENCOUNTER — Encounter (HOSPITAL_COMMUNITY)
Admission: RE | Admit: 2024-02-07 | Discharge: 2024-02-07 | Disposition: A | Discharge status: Home / Self Care | Payer: 59 | Source: Ambulatory Visit | Attending: Internal Medicine | Admitting: Internal Medicine

## 2024-02-07 DIAGNOSIS — I5042 Chronic combined systolic (congestive) and diastolic (congestive) heart failure: Secondary | ICD-10-CM | POA: Diagnosis not present

## 2024-02-07 DIAGNOSIS — I5022 Chronic systolic (congestive) heart failure: Secondary | ICD-10-CM

## 2024-02-10 ENCOUNTER — Encounter (HOSPITAL_COMMUNITY)
Admission: RE | Admit: 2024-02-10 | Discharge: 2024-02-10 | Disposition: A | Discharge status: Home / Self Care | Payer: 59 | Source: Ambulatory Visit | Attending: Internal Medicine | Admitting: Internal Medicine

## 2024-02-10 DIAGNOSIS — I5042 Chronic combined systolic (congestive) and diastolic (congestive) heart failure: Secondary | ICD-10-CM | POA: Diagnosis not present

## 2024-02-10 DIAGNOSIS — I5022 Chronic systolic (congestive) heart failure: Secondary | ICD-10-CM

## 2024-02-11 ENCOUNTER — Other Ambulatory Visit: Payer: Self-pay | Admitting: Family Medicine

## 2024-02-11 ENCOUNTER — Other Ambulatory Visit (HOSPITAL_COMMUNITY): Payer: Self-pay | Admitting: Internal Medicine

## 2024-02-11 DIAGNOSIS — G459 Transient cerebral ischemic attack, unspecified: Secondary | ICD-10-CM

## 2024-02-11 DIAGNOSIS — I2510 Atherosclerotic heart disease of native coronary artery without angina pectoris: Secondary | ICD-10-CM

## 2024-02-11 NOTE — Progress Notes (Signed)
 Cardiac Individual Treatment Plan  Patient Details  Name: Egan Berkheimer MRN: [id number] Date of Birth: 02-02-1966 Referring Provider:   Flowsheet Row INTENSIVE CARDIAC REHAB ORIENT from 12/04/2023 in [facility]  Referring Provider Arvilla Meres, MD       Initial Encounter Date:  Flowsheet Row INTENSIVE CARDIAC REHAB ORIENT from 12/04/2023 in [facility], [facility]  Date 12/04/23       Visit Diagnosis: Heart failure, chronic systolic (HCC)  Patient's Home Medications on Admission:  Current Outpatient Medications:    allopurinol (ZYLOPRIM) 300 MG tablet, TAKE 2 TABLETS (600 MG TOTAL) BY MOUTH DAILY., Disp: 60 tablet, Rfl: 6   amiodarone (PACERONE) 200 MG tablet, TAKE 1 TABLET (200 MG TOTAL) BY MOUTH DAILY., Disp: 90 tablet, Rfl: 2   aspirin EC 81 MG tablet, Take 81 mg by mouth daily. Swallow whole., Disp: , Rfl:    atorvastatin (LIPITOR) 80 MG tablet, TAKE 1 TABLET BY MOUTH DAILY., Disp: 30 tablet, Rfl: 11   calcium carbonate (TUMS - DOSED IN MG ELEMENTAL CALCIUM) 500 MG chewable tablet, Chew 2-3 tablets by mouth daily as needed for indigestion or heartburn., Disp: , Rfl:    carvedilol (COREG) 3.125 MG tablet, TAKE 1 TABLET BY MOUTH 2 TIMES DAILY., Disp: 180 tablet, Rfl: 3   cetirizine (ZYRTEC) 10 MG tablet, Take 10 mg by mouth daily., Disp: , Rfl:    cyclobenzaprine (FLEXERIL) 10 MG tablet, TAKE 1 TABLET BY MOUTH 3 TIMES DAILY AS NEEDED FOR MUSCLE SPASMS., Disp: 45 tablet, Rfl: 2   ELIQUIS 5 MG TABS tablet, TAKE 1 TABLET BY MOUTH 2 TIMES A DAY, Disp: 60 tablet, Rfl: 11   Evolocumab (REPATHA SURECLICK) 140 MG/ML SOAJ, INJECT 140 MG INTO THE SKIN EVERY 14 DAYS., Disp: 6 mL, Rfl: 0   FARXIGA 10 MG TABS tablet, TAKE 1 TABLET BY MOUTH DAILY BEFORE BREAKFAST., Disp: 90 tablet, Rfl: 2   fenofibrate 160 MG tablet, TAKE 1 TABLET BY MOUTH DAILY., Disp: 90 tablet, Rfl: 1    furosemide (LASIX) 20 MG tablet, Take 1 tablet by mouth daily as needed for swelling or a weight gain of 3 pounds or more in 24 hours or 5 pounds in one week., Disp: 30 tablet, Rfl: 5   gabapentin (NEURONTIN) 100 MG capsule, Take 100 mg by mouth 3 (three) times daily., Disp: , Rfl:    magnesium oxide (MAG-OX) 400 MG tablet, Take 1 tab by mouth Twice daily, Disp: 60 tablet, Rfl: 12   methocarbamol (ROBAXIN) 500 MG tablet, TAKE 1 TABLET BY MOUTH EVERY 6 HOURS AS NEEDED FOR MUSCLE SPASMS., Disp: 90 tablet, Rfl: 0   mexiletine (MEXITIL) 200 MG capsule, Take 1 capsule (200 mg total) by mouth 2 (two) times daily., Disp: 180 capsule, Rfl: 3   Naphazoline HCl (CLEAR EYES OP), Place 1 drop into both eyes daily as needed (sore eyes)., Disp: , Rfl:    pantoprazole (PROTONIX) 40 MG tablet, TAKE 1 TABLET BY MOUTH DAILY., Disp: 30 tablet, Rfl: 3   potassium chloride SA (KLOR-CON M20) 20 MEQ tablet, Take 1 tablet by mouth when taking Lasix, Disp: 30 tablet, Rfl: 5   sacubitril-valsartan (ENTRESTO) 49-51 MG, Take 1 tablet by mouth 2 (two) times daily., Disp: 60 tablet, Rfl: 3   Semaglutide, 2 MG/DOSE, (OZEMPIC, 2 MG/DOSE,) 8 MG/3ML SOPN, Inject 2 mg into the skin once a week. (Patient taking differently: Inject 2 mg into the skin once  a week. Wednesdays), Disp: 3 mL, Rfl: 11   sodium chloride (OCEAN) 0.65 % SOLN nasal spray, Place 1 spray into both nostrils as needed (dryness)., Disp: , Rfl:    spironolactone (ALDACTONE) 25 MG tablet, Take 1 tablet (25 mg total) by mouth at bedtime., Disp: 30 tablet, Rfl: 6   traZODone (DESYREL) 100 MG tablet, TAKE 1 TABLET (100 MG TOTAL) BY MOUTH AT BEDTIME., Disp: 90 tablet, Rfl: 1   triamcinolone (KENALOG) 0.025 % ointment, Apply 1 Application topically daily as needed (Dry skin)., Disp: , Rfl:    VASCEPA 1 g capsule, TAKE 2 CAPSULES BY MOUTH 2 TIMES DAILY., Disp: 120 capsule, Rfl: 4  Past Medical History: Past Medical History:  Diagnosis Date   AICD (automatic  cardioverter/defibrillator) present    Allergy    Arthritis    Atrial fibrillation (HCC)    CHF (congestive heart failure) (HCC)    CHF (congestive heart failure) (HCC)    Diabetes mellitus without complication (HCC)    Drug abuse (HCC)    Eczema    GERD (gastroesophageal reflux disease)    Gout    Heart attack (HCC) 06/10/2020   History of kidney stones    Hyperlipidemia    Hypertension    MI (myocardial infarction) (HCC)    PE (pulmonary embolism)    TIA (transient ischemic attack)    hx of per pt - pt not aware he had not followed by a neurologist   Ventricular tachycardia (HCC)     Tobacco Use: Social History   Tobacco Use  Smoking Status Former   Current packs/day: 0.00   Average packs/day: 0.3 packs/day for 31.0 years (7.8 ttl pk-yrs)   Types: Cigarettes   Quit date: 07/15/2023   Years since quitting: 0.5  Smokeless Tobacco Never  Tobacco Comments   Former smoker 11/01/23    Labs: Review Flowsheet  More data exists      Latest Ref Rng & Units 06/07/2022 07/25/2022 05/29/2023 07/05/2023 12/18/2023  Labs for ITP Cardiac and Pulmonary Rehab  Cholestrol 0 - 200 mg/dL - 87  88  - 96   LDL (calc) 0 - 99 mg/dL - 13  15  - 28   HDL-C >39.00 mg/dL - 16.10  37  - 96.04   Trlycerides 0.0 - 149.0 mg/dL - 540.9  811  - 91.4   Hemoglobin A1c 4.6 - 6.5 % 7.0  - - 8.5  7.4     Capillary Blood Glucose: Lab Results  Component Value Date   GLUCAP 151 (H) 12/11/2023   GLUCAP 134 (H) 12/11/2023   GLUCAP 142 (H) 12/09/2023   GLUCAP 157 (H) 12/09/2023   GLUCAP 164 (H) 12/04/2023     Exercise Target Goals: Exercise Program Goal: Individual exercise prescription set using results from initial 6 min walk test and THRR while considering  patient's activity barriers and safety.   Exercise Prescription Goal: Initial exercise prescription builds to 30-45 minutes a day of aerobic activity, 2-3 days per week.  Home exercise guidelines will be given to patient during program as part of  exercise prescription that the participant will acknowledge.  Activity Barriers & Risk Stratification:  Activity Barriers & Cardiac Risk Stratification - 12/04/23 1411       Activity Barriers & Cardiac Risk Stratification   Activity Barriers Right Knee Replacement;Decreased Ventricular Function;Deconditioning;Neck/Spine Problems    Cardiac Risk Stratification High   <5 METs on            6 Minute Walk:  6 Minute Walk     Row Name 12/04/23 1518         6 Minute Walk   Phase Initial     Distance 1620 feet     Walk Time 6 minutes     # of Rest Breaks 0     MPH 3.07     METS 3.94     RPE 9     Perceived Dyspnea  0     VO2 Peak 13.8     Symptoms Yes (comment)     Comments shin tightness- resolved with rest     Resting HR 72 bpm     Resting BP 104/62     Resting Oxygen Saturation  98 %     Exercise Oxygen Saturation  during 6 min walk 98 %     Max Ex. HR 107 bpm     Max Ex. BP 120/70     2 Minute Post BP 108/72              Oxygen Initial Assessment:   Oxygen Re-Evaluation:   Oxygen Discharge (Final Oxygen Re-Evaluation):   Initial Exercise Prescription:  Initial Exercise Prescription - 12/04/23 1500       Date of Initial Exercise RX and Referring Provider   Date 12/04/23    Referring Provider Arvilla Meres, MD    Expected Discharge Date 02/26/24      Treadmill   MPH 2.8    Grade 0    Minutes 15    METs 3.5      Bike   Level 1    Watts 60    Minutes 15    METs 3      Prescription Details   Frequency (times per week) 3    Duration Progress to 30 minutes of continuous aerobic without signs/symptoms of physical distress      Intensity   THRR 40-80% of Max Heartrate 65-130    Ratings of Perceived Exertion 11-13    Perceived Dyspnea 0-4      Progression   Progression Continue progressive overload as per policy without signs/symptoms or physical distress.      Resistance Training   Training Prescription Yes    Weight 4    Reps  10-15             Perform Capillary Blood Glucose checks as needed.  Exercise Prescription Changes:   Exercise Prescription Changes     Row Name 12/09/23 1400 12/24/23 1100 12/27/23 1400 01/08/24 1430 01/29/24 1541     Response to Exercise   Blood Pressure (Admit) 104/60 106/64 105/70 98/60 96/60    Blood Pressure (Exercise) 130/78 112/68 124/68 -- --   Blood Pressure (Exit) 105/70 96/56 100/60 102/70 98/68   Heart Rate (Admit) 79 bpm 69 bpm 76 bpm 80 bpm 86 bpm   Heart Rate (Exercise) 107 bpm 93 bpm 98 bpm 99 bpm 102 bpm   Heart Rate (Exit) 83 bpm 68 bpm 85 bpm 89 bpm 77 bpm   Rating of Perceived Exertion (Exercise) 11.5 12 11 12 12    Symptoms None None None None None   Comments Pt's first day in the CRP2 program Reviewed METs Reviewed Home exercise Reviewed METs and goals Reviewed METs   Duration Continue with 30 min of aerobic exercise without signs/symptoms of physical distress. Continue with 30 min of aerobic exercise without signs/symptoms of physical distress. Continue with 30 min of aerobic exercise without signs/symptoms of physical distress. Continue with 30 min of aerobic  exercise without signs/symptoms of physical distress. Continue with 30 min of aerobic exercise without signs/symptoms of physical distress.   Intensity THRR unchanged THRR unchanged THRR unchanged THRR unchanged THRR unchanged     Progression   Progression Continue to progress workloads to maintain intensity without signs/symptoms of physical distress. Continue to progress workloads to maintain intensity without signs/symptoms of physical distress. Continue to progress workloads to maintain intensity without signs/symptoms of physical distress. Continue to progress workloads to maintain intensity without signs/symptoms of physical distress. Continue to progress workloads to maintain intensity without signs/symptoms of physical distress.   Average METs 2.75 3.1 3.4 3.4 3.7     Resistance Training    Training Prescription Yes Yes Yes No No   Weight 4 4 lbs 4 lbs No weights on wednesdays No weights on wednesdays   Reps 10-15 10-15 10-15 -- --   Time 10 Minutes 10 Minutes 10 Minutes -- --     Interval Training   Interval Training No No No No No     Treadmill   MPH 2.8 2.8 2.8 2.8  not correct on media should be 2.8/1.0 2.8   Grade 0 0 0 1 2.5   Minutes 15 15 15 15 15    METs 3.14 3.14 3.14 3.53 4.11     Bike   Level 1 2.5 2.5 3 3.5   Watts 17 37 39 40 50   Minutes 15 15 15 15 15    METs 2.4 3.1 3.6 3.3 3.3     Home Exercise Plan   Plans to continue exercise at -- -- Home (comment) Home (comment) Home (comment)   Frequency -- -- Add 2 additional days to program exercise sessions. Add 2 additional days to program exercise sessions. Add 2 additional days to program exercise sessions.   Initial Home Exercises Provided -- -- 12/27/23 12/27/23 12/27/23    Row Name 02/05/24 1500             Response to Exercise   Blood Pressure (Admit) 94/60       Blood Pressure (Exit) 94/58       Heart Rate (Admit) 88 bpm       Heart Rate (Exercise) 105 bpm       Heart Rate (Exit) 83 bpm       Rating of Perceived Exertion (Exercise) 11       Symptoms None       Comments Reviewed METs and goals       Duration Continue with 30 min of aerobic exercise without signs/symptoms of physical distress.       Intensity THRR unchanged         Progression   Progression Continue to progress workloads to maintain intensity without signs/symptoms of physical distress.       Average METs 3.7         Resistance Training   Training Prescription No       Weight No weights on wednesdays         Interval Training   Interval Training No         Treadmill   MPH 2.8       Grade 2.5       Minutes 15       METs 4.11         Bike   Level 3.5       Watts 38       Minutes 15       METs 3.2  Home Exercise Plan   Plans to continue exercise at Home (comment)       Frequency Add 2 additional days  to program exercise sessions.       Initial Home Exercises Provided 12/27/23                Exercise Comments:   Exercise Comments     Row Name 12/09/23 1441 12/23/23 1500 12/23/23 1603 12/27/23 1431 01/08/24 1400   Exercise Comments Pt's first day in the CRP2 program. Pt exercised without complaints. -- Reviewed METs today. Pt METs are stagnent. Increased workload on bike today. Reviewed home exercise Rx. Pt will walk on his treadmill at home. Pt instucted not to do more than waht he is doing in the CRP2 program. Pt will work up to 30 minutes on the treadmill and will exercise 2x/week in addtion to the CRP2 program. Pt verbalized understanding of the home exercisse Rx and was provided a copy. Reviewed METs and goals. Pt agress to increase workloads on treadmill and bike today. Pt is making progress on goals and MET level.    Row Name 01/30/24 1543 02/05/24 1500         Exercise Comments Reviewed METs. Pt is making progress. Reviewed METs and goals.  Pt has made progress on METs and goals since last goals review.               Exercise Goals and Review:   Exercise Goals     Row Name 12/04/23 1411             Exercise Goals   Increase Physical Activity Yes       Intervention Develop an individualized exercise prescription for aerobic and resistive training based on initial evaluation findings, risk stratification, comorbidities and participant's personal goals.;Provide advice, education, support and counseling about physical activity/exercise needs.       Expected Outcomes Short Term: Attend rehab on a regular basis to increase amount of physical activity.;Long Term: Exercising regularly at least 3-5 days a week.;Long Term: Add in home exercise to make exercise part of routine and to increase amount of physical activity.       Increase Strength and Stamina Yes       Intervention Provide advice, education, support and counseling about physical activity/exercise needs.;Develop  an individualized exercise prescription for aerobic and resistive training based on initial evaluation findings, risk stratification, comorbidities and participant's personal goals.       Expected Outcomes Short Term: Increase workloads from initial exercise prescription for resistance, speed, and METs.;Short Term: Perform resistance training exercises routinely during rehab and add in resistance training at home;Long Term: Improve cardiorespiratory fitness, muscular endurance and strength as measured by increased METs and functional capacity ( )       Able to understand and use rate of perceived exertion (RPE) scale Yes       Intervention Provide education and explanation on how to use RPE scale       Expected Outcomes Short Term: Able to use RPE daily in rehab to express subjective intensity level;Long Term:  Able to use RPE to guide intensity level when exercising independently       Knowledge and understanding of Target Heart Rate Range (THRR) Yes       Intervention Provide education and explanation of THRR including how the numbers were predicted and where they are located for reference       Expected Outcomes Short Term: Able to state/look up THRR;Short Term: Able to use  daily as guideline for intensity in rehab;Long Term: Able to use THRR to govern intensity when exercising independently       Understanding of Exercise Prescription Yes       Intervention Provide education, explanation, and written materials on patient's individual exercise prescription       Expected Outcomes Short Term: Able to explain program exercise prescription;Long Term: Able to explain home exercise prescription to exercise independently                Exercise Goals Re-Evaluation :  Exercise Goals Re-Evaluation     Row Name 12/09/23 1436 01/08/24 1430 02/05/24 1500         Exercise Goal Re-Evaluation   Exercise Goals Review Increase Physical Activity;Understanding of Exercise Prescription;Increase Strength  and Stamina;Knowledge and understanding of Target Heart Rate Range (THRR);Able to understand and use rate of perceived exertion (RPE) scale Increase Physical Activity;Understanding of Exercise Prescription;Increase Strength and Stamina;Knowledge and understanding of Target Heart Rate Range (THRR);Able to understand and use rate of perceived exertion (RPE) scale Increase Physical Activity;Understanding of Exercise Prescription;Increase Strength and Stamina;Knowledge and understanding of Target Heart Rate Range (THRR);Able to understand and use rate of perceived exertion (RPE) scale     Comments Pt's first day in the CRP2 program. Pt understands the exericse Rx, RPE sclae and THRR. Reviewed METs and goals. Pt voices that his strength and stamina is improving. Pt is doing better with being consistent with an exercise rouitne by attending the CRP2 program. However, pt has not started on his home exercise due to work issues. Encouraged patient to try and schedule 30 minutes 2x/week for his home exercise. Pt agrees to try to begin his home program. Reviewed METs and goals. Pt continues to voice that his strength and stamina is improving. Pt is doing better with being consistent with an exercise rouitne by attending the CRP2 program. Pt voices that he has only been able to get in 1 day per week of walking. Pt congratulated on that extra day of exercise and continue to encourage any extra activity outside of the CRP2 program.     Expected Outcomes Will continue to monitor patient and progress exercise workloads as tolerated. Will continue to monitor patient and progress exercise workloads as tolerated. Will continue to monitor patient and progress exercise workloads as tolerated.              Discharge Exercise Prescription (Final Exercise Prescription Changes):  Exercise Prescription Changes - 02/05/24 1500       Response to Exercise   Blood Pressure (Admit) 94/60    Blood Pressure (Exit) 94/58    Heart  Rate (Admit) 88 bpm    Heart Rate (Exercise) 105 bpm    Heart Rate (Exit) 83 bpm    Rating of Perceived Exertion (Exercise) 11    Symptoms None    Comments Reviewed METs and goals    Duration Continue with 30 min of aerobic exercise without signs/symptoms of physical distress.    Intensity THRR unchanged      Progression   Progression Continue to progress workloads to maintain intensity without signs/symptoms of physical distress.    Average METs 3.7      Resistance Training   Training Prescription No    Weight No weights on wednesdays      Interval Training   Interval Training No      Treadmill   MPH 2.8    Grade 2.5    Minutes 15    METs 4.11  Bike   Level 3.5    Watts 38    Minutes 15    METs 3.2      Home Exercise Plan   Plans to continue exercise at Home (comment)    Frequency Add 2 additional days to program exercise sessions.    Initial Home Exercises Provided 12/27/23             Nutrition:  Target Goals: Understanding of nutrition guidelines, daily intake of sodium 1500mg , cholesterol 200mg , calories 30% from fat and 7% or less from saturated fats, daily to have 5 or more servings of fruits and vegetables.  Biometrics:  Pre Biometrics - 12/04/23 1407       Pre Biometrics   Waist Circumference 48 inches    Hip Circumference 45 inches    Waist to Hip Ratio 1.07 %    Triceps Skinfold 24 mm    % Body Fat 33.3 %    Grip Strength 42 kg    Flexibility 13.25 in    Single Leg Stand 25 seconds              Nutrition Therapy Plan and Nutrition Goals:  Nutrition Therapy & Goals - 01/13/24 1449       Nutrition Therapy   Diet Heart Healthy/Carbohydrate Consistent diet    Drug/Food Interactions Statins/Certain Fruits      Personal Nutrition Goals   Nutrition Goal Patient to identify strategies for reducing cardiovascular risk by attending the [facility] education and nutrition series weekly.   goal in progress.   Personal Goal #2 Patient to  improve diet quality by using the plate method as a guide for meal planning to include lean protein/plant protein, fruits, vegetables, whole grains, nonfat dairy as part of a well-balanced diet.   goal in progress.   Personal Goal #3 Patient to identify strategies for weight loss of 0.5-2.0# per week.   goal not met.   Comments Goals in progress. Patient has medical history of CHF, HTN, Hyperlipidemia, TIA, v-tach. A1c remains above goal; he continue ozempic. He has lost 1.1# since starting with our program. Lipids remain at goal. He continues to attend the [facility] education and nutrition series regularly. Patient will benefit from participation in intensive cardiac rehab for nutrition, exercise, and lifestyle modification.      Intervention Plan   Intervention Prescribe, educate and counsel regarding individualized specific dietary modifications aiming towards targeted core components such as weight, hypertension, lipid management, diabetes, heart failure and other comorbidities.;Nutrition handout(s) given to patient.    Expected Outcomes Short Term Goal: Understand basic principles of dietary content, such as calories, fat, sodium, cholesterol and nutrients.;Long Term Goal: Adherence to prescribed nutrition plan.             Nutrition Assessments:  MEDIFICTS Score Key: >=70 Need to make dietary changes  40-70 Heart Healthy Diet <= 40 Therapeutic Level Cholesterol Diet   Flowsheet Row INTENSIVE CARDIAC REHAB from 12/09/2023 in [facility], Vascular, & Lung Health  Picture Your Plate Total Score on Admission 47      Picture Your Plate Scores: <16 Unhealthy dietary pattern with much room for improvement. 41-50 Dietary pattern unlikely to meet recommendations for good health and room for improvement. 51-60 More healthful dietary pattern, with some room for improvement.  >60 Healthy dietary pattern, although there may be some specific behaviors that could  be improved.    Nutrition Goals Re-Evaluation:  Nutrition Goals Re-Evaluation     Row Name 01/13/24  1449             Goals   Current Weight 235 lb 7.2 oz (106.8 kg)       Comment Cr 1.36, LDL 28, lipids WNL, A1c 7.4       Expected Outcome Goals in progress. Patient has medical history of CHF, HTN, Hyperlipidemia, TIA, v-tach. A1c remains above goal; he continue ozempic. He has lost 1.1# since starting with our program. Lipids remain at goal. He continues to attend the [facility] education and nutrition series regularly. Patient will benefit from participation in intensive cardiac rehab for nutrition, exercise, and lifestyle modification.                Nutrition Goals Re-Evaluation:  Nutrition Goals Re-Evaluation     Row Name 01/13/24 1449             Goals   Current Weight 235 lb 7.2 oz (106.8 kg)       Comment Cr 1.36, LDL 28, lipids WNL, A1c 7.4       Expected Outcome Goals in progress. Patient has medical history of CHF, HTN, Hyperlipidemia, TIA, v-tach. A1c remains above goal; he continue ozempic. He has lost 1.1# since starting with our program. Lipids remain at goal. He continues to attend the [facility] education and nutrition series regularly. Patient will benefit from participation in intensive cardiac rehab for nutrition, exercise, and lifestyle modification.                Nutrition Goals Discharge (Final Nutrition Goals Re-Evaluation):  Nutrition Goals Re-Evaluation - 01/13/24 1449       Goals   Current Weight 235 lb 7.2 oz (106.8 kg)    Comment Cr 1.36, LDL 28, lipids WNL, A1c 7.4    Expected Outcome Goals in progress. Patient has medical history of CHF, HTN, Hyperlipidemia, TIA, v-tach. A1c remains above goal; he continue ozempic. He has lost 1.1# since starting with our program. Lipids remain at goal. He continues to attend the [facility] education and nutrition series regularly. Patient will benefit from participation in intensive cardiac rehab for  nutrition, exercise, and lifestyle modification.             Psychosocial: Target Goals: Acknowledge presence or absence of significant depression and/or stress, maximize coping skills, provide positive support system. Participant is able to verbalize types and ability to use techniques and skills needed for reducing stress and depression.  Initial Review & Psychosocial Screening:  Initial Psych Review & Screening - 12/04/23 1412       Initial Review   Current issues with None Identified      Family Dynamics   Good Support System? Yes   Wife for support     Barriers   Psychosocial barriers to participate in program There are no identifiable barriers or psychosocial needs.      Screening Interventions   Interventions Provide feedback about the scores to participant;Encouraged to exercise    Expected Outcomes Long Term goal: The participant improves quality of Life and PHQ9 Scores as seen by post scores and/or verbalization of changes;Short Term goal: Identification and review with participant of any Quality of Life or Depression concerns found by scoring the questionnaire.             Quality of Life Scores:  Quality of Life - 12/04/23 1520       Quality of Life   [facility] Quality of Life      Quality of Life Scores   Health/Function Pre 18.97 %  Socioeconomic Pre 20.5 %    Psych/Spiritual Pre 20.25 %    Family Pre 22.5 %    GLOBAL Pre 19.98 %            Scores of 19 and below usually indicate a poorer quality of life in these areas.  A difference of  2-3 points is a clinically meaningful difference.  A difference of 2-3 points in the total score of the Quality of Life Index has been associated with significant improvement in overall quality of life, self-image, physical symptoms, and general health in studies assessing change in quality of life.  PHQ-9: Review Flowsheet  More data exists      12/18/2023 12/04/2023 07/05/2023 07/25/2022 01/23/2022  Depression  screen PHQ 2/9  Decreased Interest 0 0 0 0 0  Down, Depressed, Hopeless 1 0 0 1 0  PHQ - 2 Score 1 0 0 1 0  Altered sleeping 1 0 1 0 1  Tired, decreased energy 0 1 1 0 0  Change in appetite 1 1 1 1 1   Feeling bad or failure about yourself  0 0 0 0 0  Trouble concentrating 1 0 0 0 1  Moving slowly or fidgety/restless 0 0 0 0 0  Suicidal thoughts 0 0 0 0 0  PHQ-9 Score 4 2 3 2 3   Difficult doing work/chores Somewhat difficult Not difficult at all Not difficult at all Not difficult at all Not difficult at all   Interpretation of Total Score  Total Score Depression Severity:  1-4 = Minimal depression, 5-9 = Mild depression, 10-14 = Moderate depression, 15-19 = Moderately severe depression, 20-27 = Severe depression   Psychosocial Evaluation and Intervention:   Psychosocial Re-Evaluation:  Psychosocial Re-Evaluation     Row Name 12/10/23 0945 01/09/24 1541 02/11/24 1340         Psychosocial Re-Evaluation   Current issues with None Identified None Identified Current Stress Concerns     Comments Jaidin says sometimes he has a variation in his appetite due to being on ozempic. Aris has not voiced any increased concerns or stressors during exercise at cardiac rehab Ziair did report having some concerns about taking the antiarrythmic mexitine for ventricular tachycardia     Expected Outcomes -- -- Lynwood will have controlled or decreased stressors upon completion of cardiac rehab     Interventions Encouraged to attend Cardiac Rehabilitation for the exercise Encouraged to attend Cardiac Rehabilitation for the exercise Encouraged to attend Cardiac Rehabilitation for the exercise     Continue Psychosocial Services  No Follow up required No Follow up required No Follow up required       Initial Review   Source of Stress Concerns -- -- Chronic Illness     Comments -- -- Will continue to monitor and offer support as needed              Psychosocial Discharge (Final Psychosocial  Re-Evaluation):  Psychosocial Re-Evaluation - 02/11/24 1340       Psychosocial Re-Evaluation   Current issues with Current Stress Concerns    Comments Kingsly did report having some concerns about taking the antiarrythmic mexitine for ventricular tachycardia    Expected Outcomes Quint will have controlled or decreased stressors upon completion of cardiac rehab    Interventions Encouraged to attend Cardiac Rehabilitation for the exercise    Continue Psychosocial Services  No Follow up required      Initial Review   Source of Stress Concerns Chronic Illness    Comments Will  continue to monitor and offer support as needed             Vocational Rehabilitation: Provide vocational rehab assistance to qualifying candidates.   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 Participant will state understanding/return demonstration of topics presented.    Education     Row Name 12/09/23 1500     Education   Cardiac Education Topics Pritikin   [facility] Workshops     Workshops   Educator Exercise Physiologist   [facility] Exercise   Exercise Workshop Managing Heart Disease: Your Path to a Healthier Heart   Instruction Review Code 1- Verbalizes Understanding   Class Start Time 1355   Class Stop Time 1445   Class Time Calculation (min) 50 min    Row Name 12/11/23 1500     Education   Cardiac Education Topics Pritikin   Orthoptist   Educator Dietitian   Weekly Topic Simple Sides and Sauces   Instruction Review Code 1- Verbalizes Understanding   Class Start Time 1145   Class Stop Time 1226   Class Time Calculation (min) 41 min    Row Name  12/16/23 1300     Education   Cardiac Education Topics Pritikin   Geographical information systems officer Psychosocial   Psychosocial Workshop From Head to Heart: The Power of a Healthy Outlook   Instruction Review Code 1- Verbalizes Understanding   Class Start Time 1400   Class Stop Time 1446   Class Time Calculation (min) 46 min    Row Name 12/18/23 1300     Education   Cardiac Education Topics Pritikin   Secondary school teacher School   Educator Nurse;Respiratory Therapist   Weekly Topic Powerhouse Plant-Based Proteins   Instruction Review Code 1- Verbalizes Understanding   Class Start Time 1150   Class Stop Time 1225   Class Time Calculation (min) 35 min    Row Name 12/23/23 1300     Education   Cardiac Education Topics Pritikin   [facility] Workshops     Workshops   Educator Exercise Physiologist   [facility] Exercise   Exercise Workshop Location manager and Fall Prevention   Instruction Review Code 1- Verbalizes Understanding   Class Start Time 1400   Class Stop Time 1445   Class Time Calculation (min) 45 min    Row Name 12/25/23 1400     Education   Cardiac Education Topics Pritikin   Customer service manager   Weekly Topic Adding Flavor - Sodium-Free   Instruction Review Code 1- Verbalizes Understanding   Class Start Time 1145   Class Stop Time 1225   Class Time Calculation (min) 40 min    Row Name 12/27/23 1500     Education   Cardiac Education Topics Pritikin   Licensed conveyancer Nutrition   Nutrition Overview of the Pritikin Eating Plan   Instruction Review Code 1- Verbalizes Understanding  Class Start Time 1357   Class Stop Time 1444   Class Time Calculation (min) 47 min    Row Name 12/30/23 1400     Education   Cardiac Education Topics Pritikin   Nurse, children's Exercise  Physiologist   [facility] Psychosocial   Psychosocial Healthy Minds, Bodies, Hearts   Instruction Review Code 1- Verbalizes Understanding   Class Start Time 1409   Class Stop Time 1443   Class Time Calculation (min) 34 min    Row Name 01/06/24 1400     Education   Cardiac Education Topics Pritikin   Select Core Videos     Core Videos   Educator Nurse   Select Nutrition   Nutrition Becoming a Pritikin Chef   Instruction Review Code 1- Verbalizes Understanding   Class Start Time 1401   Class Stop Time 1436   Class Time Calculation (min) 35 min    Row Name 01/08/24 1500     Education   Cardiac Education Topics Pritikin   Orthoptist   Educator Dietitian;Nurse   Weekly Topic Personalizing Your Pritikin Plate   Instruction Review Code 1- Verbalizes Understanding   Class Start Time 1400   Class Stop Time 1439   Class Time Calculation (min) 39 min    Row Name 01/13/24 1600     Education   Cardiac Education Topics Pritikin   [facility] Workshops     Workshops   Educator Exercise Physiologist   [facility] Psychosocial   Psychosocial Workshop Recognizing and Reducing Stress   Instruction Review Code 1- Verbalizes Understanding   Class Start Time 1358   Class Stop Time 1445   Class Time Calculation (min) 47 min    Row Name 01/15/24 1400     Education   Cardiac Education Topics Pritikin   Orthoptist   Educator Dietitian   Weekly Topic Delicious Desserts   Instruction Review Code 1- Verbalizes Understanding   Class Start Time 1355   Class Stop Time 1433   Class Time Calculation (min) 38 min    Row Name 01/17/24 1200     Education   Cardiac Education Topics Pritikin   Licensed conveyancer Nutrition   Nutrition Calorie Density   Instruction Review Code 1- Verbalizes Understanding   Class Start Time 1400   Class Stop Time 1435   Class Time Calculation (min) 35 min     Row Name 01/20/24 1500     Education   Cardiac Education Topics Pritikin   [facility] Workshops     Workshops   Educator Exercise Physiologist   [facility] Exercise   Exercise Workshop Exercise Basics: Building Your Action Plan   Instruction Review Code 1- Verbalizes Understanding   Class Start Time 1405   Class Stop Time 1455   Class Time Calculation (min) 50 min    Row Name 01/22/24 1400     Education   Cardiac Education Topics Pritikin   Customer service manager   Weekly Topic Efficiency Cooking - Meals in a Snap   Instruction Review Code 1- Verbalizes Understanding   Class Start Time 1345   Class Stop Time 1435   Class Time Calculation (min) 50 min    Row Name 01/27/24 1500  Education   Cardiac Education Topics Pritikin   Glass blower/designer Nutrition   Nutrition Workshop Targeting Your Nutrition Priorities   Instruction Review Code 1- Verbalizes Understanding   Class Start Time 1400   Class Stop Time 1440   Class Time Calculation (min) 40 min    Row Name 01/29/24 1500     Education   Cardiac Education Topics Pritikin   Customer service manager   Weekly Topic One-Pot Wonders   Instruction Review Code 1- Verbalizes Understanding   Class Start Time 1400   Class Stop Time 1445   Class Time Calculation (min) 45 min    Row Name 01/31/24 1200     Education   Cardiac Education Topics Pritikin   Hospital doctor Education   General Education Hypertension and Heart Disease   Instruction Review Code 1- Verbalizes Understanding    Row Name 02/03/24 1400     Education   Cardiac Education Topics Pritikin   [facility] Workshops     Workshops   Educator Exercise Physiologist   [facility] Psychosocial   Psychosocial Workshop Focused Goals, Sustainable Changes   Instruction  Review Code 1- Verbalizes Understanding   Class Start Time 1345   Class Stop Time 1443   Class Time Calculation (min) 58 min    Row Name 02/05/24 1500     Education   Cardiac Education Topics Pritikin   Customer service manager   Weekly Topic Comforting Weekend Breakfasts   Instruction Review Code 1- Verbalizes Understanding   Class Start Time 1400   Class Stop Time 1440   Class Time Calculation (min) 40 min    Row Name 02/07/24 1500     Education   Cardiac Education Topics Pritikin   Licensed conveyancer Nutrition   Nutrition Dining Out - Part 1   Instruction Review Code 1- Verbalizes Understanding   Class Start Time 1400   Class Stop Time 1440   Class Time Calculation (min) 40 min    Row Name 02/10/24 1400     Education   Cardiac Education Topics Pritikin   Psychologist, forensic Exercise Education   Exercise Education Biomechanial Limitations   Instruction Review Code 1- Verbalizes Understanding   Class Start Time 1400   Class Stop Time 1440   Class Time Calculation (min) 40 min            Core Videos: Exercise    Move It!  Clinical staff conducted group or individual video education with verbal and written material and guidebook.  Patient learns the recommended Pritikin exercise program. Exercise with the goal of living a long, healthy life. Some of the health benefits of exercise include controlled diabetes, healthier blood pressure levels, improved cholesterol levels, improved heart and lung capacity, improved sleep, and better body composition. Everyone should speak with their doctor before starting or changing an exercise routine.  Biomechanical Limitations Clinical staff conducted group or individual video education with verbal and written material and guidebook.  Patient learns how biomechanical limitations can  impact exercise and how we can mitigate and possibly overcome limitations  to have an impactful and balanced exercise routine.  Body Composition Clinical staff conducted group or individual video education with verbal and written material and guidebook.  Patient learns that body composition (ratio of muscle mass to fat mass) is a key component to assessing overall fitness, rather than body weight alone. Increased fat mass, especially visceral belly fat, can put [place] at increased risk for metabolic syndrome, type 2 diabetes, heart disease, and even death. It is recommended to combine diet and exercise (cardiovascular and resistance training) to improve your body composition. Seek guidance from your physician and exercise physiologist before implementing an exercise routine.  Exercise Action Plan Clinical staff conducted group or individual video education with verbal and written material and guidebook.  Patient learns the recommended strategies to achieve and enjoy long-term exercise adherence, including variety, self-motivation, self-efficacy, and positive decision making. Benefits of exercise include fitness, good health, weight management, more energy, better sleep, less stress, and overall well-being.  Medical   Heart Disease Risk Reduction Clinical staff conducted group or individual video education with verbal and written material and guidebook.  Patient learns our heart is our most vital organ as it circulates oxygen, nutrients, white blood cells, and hormones throughout the entire body, and carries waste away. Data supports a plant-based eating plan like the Pritikin Program for its effectiveness in slowing progression of and reversing heart disease. The video provides a number of recommendations to address heart disease.   Metabolic Syndrome and Belly Fat  Clinical staff conducted group or individual video education with verbal and written material and guidebook.  Patient learns what metabolic  syndrome is, how it leads to heart disease, and how one can reverse it and keep it from coming back. You have metabolic syndrome if you have 3 of the following 5 criteria: abdominal obesity, high blood pressure, high triglycerides, low HDL cholesterol, and high blood sugar.  Hypertension and Heart Disease Clinical staff conducted group or individual video education with verbal and written material and guidebook.  Patient learns that high blood pressure, or hypertension, is very common in the [place]. Hypertension is largely due to excessive salt intake, but other important risk factors include being overweight, physical inactivity, drinking too much alcohol, smoking, and not eating enough potassium from fruits and vegetables. High blood pressure is a leading risk factor for heart attack, stroke, congestive heart failure, dementia, kidney failure, and premature death. Long-term effects of excessive salt intake include stiffening of the arteries and thickening of heart muscle and organ damage. Recommendations include ways to reduce hypertension and the risk of heart disease.  Diseases of Our Time - Focusing on Diabetes Clinical staff conducted group or individual video education with verbal and written material and guidebook.  Patient learns why the best way to stop diseases of our time is prevention, through food and other lifestyle changes. Medicine (such as prescription pills and surgeries) is often only a Band-Aid on the problem, not a long-term solution. Most common diseases of our time include obesity, type 2 diabetes, hypertension, heart disease, and cancer. The Pritikin Program is recommended and has been proven to help reduce, reverse, and/or prevent the damaging effects of metabolic syndrome.  Nutrition   Overview of the Pritikin Eating Plan  Clinical staff conducted group or individual video education with verbal and written material and guidebook.  Patient learns about the Pritikin  Eating Plan for disease risk reduction. The Pritikin Eating Plan emphasizes a wide variety of unrefined, minimally-processed carbohydrates, like fruits, vegetables, whole grains,  and legumes. Go, Caution, and Stop food choices are explained. Plant-based and lean animal proteins are emphasized. Rationale provided for low sodium intake for blood pressure control, low added sugars for blood sugar stabilization, and low added fats and oils for coronary artery disease risk reduction and weight management.  Calorie Density  Clinical staff conducted group or individual video education with verbal and written material and guidebook.  Patient learns about calorie density and how it impacts the Pritikin Eating Plan. Knowing the characteristics of the food you choose will help you decide whether those foods will lead to weight gain or weight loss, and whether you want to consume more or less of them. Weight loss is usually a side effect of the Pritikin Eating Plan because of its focus on low calorie-dense foods.  Label Reading  Clinical staff conducted group or individual video education with verbal and written material and guidebook.  Patient learns about the Pritikin recommended label reading guidelines and corresponding recommendations regarding calorie density, added sugars, sodium content, and whole grains.  Dining Out - Part 1  Clinical staff conducted group or individual video education with verbal and written material and guidebook.  Patient learns that restaurant meals can be sabotaging because they can be so high in calories, fat, sodium, and/or sugar. Patient learns recommended strategies on how to positively address this and avoid unhealthy pitfalls.  Facts on Fats  Clinical staff conducted group or individual video education with verbal and written material and guidebook.  Patient learns that lifestyle modifications can be just as effective, if not more so, as many medications for lowering your  risk of heart disease. A Pritikin lifestyle can help to reduce your risk of inflammation and atherosclerosis (cholesterol build-up, or plaque, in the artery walls). Lifestyle interventions such as dietary choices and physical activity address the cause of atherosclerosis. A review of the types of fats and their impact on blood cholesterol levels, along with dietary recommendations to reduce fat intake is also included.  Nutrition Action Plan  Clinical staff conducted group or individual video education with verbal and written material and guidebook.  Patient learns how to incorporate Pritikin recommendations into their lifestyle. Recommendations include planning and keeping personal health goals in mind as an important part of their success.  Healthy Mind-Set    Healthy Minds, Bodies, Hearts  Clinical staff conducted group or individual video education with verbal and written material and guidebook.  Patient learns how to identify when they are stressed. Video will discuss the impact of that stress, as well as the many benefits of stress management. Patient will also be introduced to stress management techniques. The way we think, act, and feel has an impact on our hearts.  How Our Thoughts Can Heal Our Hearts  Clinical staff conducted group or individual video education with verbal and written material and guidebook.  Patient learns that negative thoughts can cause depression and anxiety. This can result in negative lifestyle behavior and serious health problems. Cognitive behavioral therapy is an effective method to help control our thoughts in order to change and improve our emotional outlook.  Additional Videos:  Exercise    Improving Performance  Clinical staff conducted group or individual video education with verbal and written material and guidebook.  Patient learns to use a non-linear approach by alternating intensity levels and lengths of time spent exercising to help burn more calories  and lose more body fat. Cardiovascular exercise helps improve heart health, metabolism, hormonal balance, blood sugar control, and recovery  from fatigue. Resistance training improves strength, endurance, balance, coordination, reaction time, metabolism, and muscle mass. Flexibility exercise improves circulation, posture, and balance. Seek guidance from your physician and exercise physiologist before implementing an exercise routine and learn your capabilities and proper form for all exercise.  Introduction to Yoga  Clinical staff conducted group or individual video education with verbal and written material and guidebook.  Patient learns about yoga, a discipline of the coming together of mind, breath, and body. The benefits of yoga include improved flexibility, improved range of motion, better posture and core strength, increased lung function, weight loss, and positive self-image. Yoga's heart health benefits include lowered blood pressure, healthier heart rate, decreased cholesterol and triglyceride levels, improved immune function, and reduced stress. Seek guidance from your physician and exercise physiologist before implementing an exercise routine and learn your capabilities and proper form for all exercise.  Medical   Aging: Enhancing Your Quality of Life  Clinical staff conducted group or individual video education with verbal and written material and guidebook.  Patient learns key strategies and recommendations to stay in good physical health and enhance quality of life, such as prevention strategies, having an advocate, securing a Health Care Proxy and Power of Attorney, and keeping a list of medications and system for tracking them. It also discusses how to avoid risk for bone loss.  Biology of Weight Control  Clinical staff conducted group or individual video education with verbal and written material and guidebook.  Patient learns that weight gain occurs because we consume more calories than  we burn (eating more, moving less). Even if your body weight is normal, you may have higher ratios of fat compared to muscle mass. Too much body fat puts you at increased risk for cardiovascular disease, heart attack, stroke, type 2 diabetes, and obesity-related cancers. In addition to exercise, following the Pritikin Eating Plan can help reduce your risk.  Decoding Lab Results  Clinical staff conducted group or individual video education with verbal and written material and guidebook.  Patient learns that lab test reflects one measurement whose values change over time and are influenced by many factors, including medication, stress, sleep, exercise, food, hydration, pre-existing medical conditions, and more. It is recommended to use the knowledge from this video to become more involved with your lab results and evaluate your numbers to speak with your doctor.   Diseases of Our Time - Overview  Clinical staff conducted group or individual video education with verbal and written material and guidebook.  Patient learns that according to the CDC, 50% to 70% of chronic diseases (such as obesity, type 2 diabetes, elevated lipids, hypertension, and heart disease) are avoidable through lifestyle improvements including healthier food choices, listening to satiety cues, and increased physical activity.  Sleep Disorders Clinical staff conducted group or individual video education with verbal and written material and guidebook.  Patient learns how good quality and duration of sleep are important to overall health and well-being. Patient also learns about sleep disorders and how they impact health along with recommendations to address them, including discussing with a physician.  Nutrition  Dining Out - Part 2 Clinical staff conducted group or individual video education with verbal and written material and guidebook.  Patient learns how to plan ahead and communicate in order to maximize their dining experience  in a healthy and nutritious manner. Included are recommended food choices based on the type of restaurant the patient is visiting.   Fueling a Banker conducted group  or individual video education with verbal and written material and guidebook.  There is a strong connection between our food choices and our health. Diseases like obesity and type 2 diabetes are very prevalent and are in large-part due to lifestyle choices. The Pritikin Eating Plan provides plenty of food and hunger-curbing satisfaction. It is easy to follow, affordable, and helps reduce health risks.  Menu Workshop  Clinical staff conducted group or individual video education with verbal and written material and guidebook.  Patient learns that restaurant meals can sabotage health goals because they are often packed with calories, fat, sodium, and sugar. Recommendations include strategies to plan ahead and to communicate with the manager, chef, or server to help order a healthier meal.  Planning Your Eating Strategy  Clinical staff conducted group or individual video education with verbal and written material and guidebook.  Patient learns about the Pritikin Eating Plan and its benefit of reducing the risk of disease. The Pritikin Eating Plan does not focus on calories. Instead, it emphasizes high-quality, nutrient-rich foods. By knowing the characteristics of the foods, we choose, we can determine their calorie density and make informed decisions.  Targeting Your Nutrition Priorities  Clinical staff conducted group or individual video education with verbal and written material and guidebook.  Patient learns that lifestyle habits have a tremendous impact on disease risk and progression. This video provides eating and physical activity recommendations based on your personal health goals, such as reducing LDL cholesterol, losing weight, preventing or controlling type 2 diabetes, and reducing high blood  pressure.  Vitamins and Minerals  Clinical staff conducted group or individual video education with verbal and written material and guidebook.  Patient learns different ways to obtain key vitamins and minerals, including through a recommended healthy diet. It is important to discuss all supplements you take with your doctor.   Healthy Mind-Set    Smoking Cessation  Clinical staff conducted group or individual video education with verbal and written material and guidebook.  Patient learns that cigarette smoking and tobacco addiction pose a serious health risk which affects millions of people. Stopping smoking will significantly reduce the risk of heart disease, lung disease, and many forms of cancer. Recommended strategies for quitting are covered, including working with your doctor to develop a successful plan.  Culinary   Becoming a Set designer conducted group or individual video education with verbal and written material and guidebook.  Patient learns that cooking at home can be healthy, cost-effective, quick, and puts them in control. Keys to cooking healthy recipes will include looking at your recipe, assessing your equipment needs, planning ahead, making it simple, choosing cost-effective seasonal ingredients, and limiting the use of added fats, salts, and sugars.  Cooking - Breakfast and Snacks  Clinical staff conducted group or individual video education with verbal and written material and guidebook.  Patient learns how important breakfast is to satiety and nutrition through the entire day. Recommendations include key foods to eat during breakfast to help stabilize blood sugar levels and to prevent overeating at meals later in the day. Planning ahead is also a key component.  Cooking - Educational psychologist conducted group or individual video education with verbal and written material and guidebook.  Patient learns eating strategies to improve overall  health, including an approach to cook more at home. Recommendations include thinking of animal protein as a side on your plate rather than center stage and focusing instead on lower calorie dense options like  vegetables, fruits, whole grains, and plant-based proteins, such as beans. Making sauces in large quantities to freeze for later and leaving the skin on your vegetables are also recommended to maximize your experience.  Cooking - Healthy Salads and Dressing Clinical staff conducted group or individual video education with verbal and written material and guidebook.  Patient learns that vegetables, fruits, whole grains, and legumes are the foundations of the Pritikin Eating Plan. Recommendations include how to incorporate each of these in flavorful and healthy salads, and how to create homemade salad dressings. Proper handling of ingredients is also covered. Cooking - Soups and [employer] - Soups and Desserts Clinical staff conducted group or individual video education with verbal and written material and guidebook.  Patient learns that Pritikin soups and desserts make for easy, nutritious, and delicious snacks and meal components that are low in sodium, fat, sugar, and calorie density, while high in vitamins, minerals, and filling fiber. Recommendations include simple and healthy ideas for soups and desserts.   Overview     The Pritikin Solution Program Overview Clinical staff conducted group or individual video education with verbal and written material and guidebook.  Patient learns that the results of the Pritikin Program have been documented in more than 100 articles published in peer-reviewed journals, and the benefits include reducing risk factors for (and, in some cases, even reversing) high cholesterol, high blood pressure, type 2 diabetes, obesity, and more! An overview of the three key pillars of the Pritikin Program will be covered: eating well, doing regular exercise, and having a  healthy mind-set.  WORKSHOPS  Exercise: Exercise Basics: Building Your Action Plan Clinical staff led group instruction and group discussion with PowerPoint presentation and patient guidebook. To enhance the learning environment the use of posters, models and videos may be added. At the conclusion of this workshop, patients will comprehend the difference between physical activity and exercise, as well as the benefits of incorporating both, into their routine. Patients will understand the FITT (Frequency, Intensity, Time, and Type) principle and how to use it to build an exercise action plan. In addition, safety concerns and other considerations for exercise and cardiac rehab will be addressed by the presenter. The purpose of this lesson is to promote a comprehensive and effective weekly exercise routine in order to improve patients' overall level of fitness.   Managing Heart Disease: Your Path to a Healthier Heart Clinical staff led group instruction and group discussion with PowerPoint presentation and patient guidebook. To enhance the learning environment the use of posters, models and videos may be added.At the conclusion of this workshop, patients will understand the anatomy and physiology of the heart. Additionally, they will understand how Pritikin's three pillars impact the risk factors, the progression, and the management of heart disease.  The purpose of this lesson is to provide a high-level overview of the heart, heart disease, and how the Pritikin lifestyle positively impacts risk factors.  Exercise Biomechanics Clinical staff led group instruction and group discussion with PowerPoint presentation and patient guidebook. To enhance the learning environment the use of posters, models and videos may be added. Patients will learn how the structural parts of their bodies function and how these functions impact their daily activities, movement, and exercise. Patients will learn how to  promote a neutral spine, learn how to manage pain, and identify ways to improve their physical movement in order to promote healthy living. The purpose of this lesson is to expose patients to common physical limitations that impact  physical activity. Participants will learn practical ways to adapt and manage aches and pains, and to minimize their effect on regular exercise. Patients will learn how to maintain good posture while sitting, walking, and lifting.  Balance Training and Fall Prevention  Clinical staff led group instruction and group discussion with PowerPoint presentation and patient guidebook. To enhance the learning environment the use of posters, models and videos may be added. At the conclusion of this workshop, patients will understand the importance of their sensorimotor skills (vision, proprioception, and the vestibular system) in maintaining their ability to balance as they age. Patients will apply a variety of balancing exercises that are appropriate for their current level of function. Patients will understand the common causes for poor balance, possible solutions to these problems, and ways to modify their physical environment in order to minimize their fall risk. The purpose of this lesson is to teach patients about the importance of maintaining balance as they age and ways to minimize their risk of falling.  WORKSHOPS   Nutrition:  Fueling a Ship broker led group instruction and group discussion with PowerPoint presentation and patient guidebook. To enhance the learning environment the use of posters, models and videos may be added. Patients will review the foundational principles of the Pritikin Eating Plan and understand what constitutes a serving size in each of the food groups. Patients will also learn Pritikin-friendly foods that are better choices when away from home and review make-ahead meal and snack options. Calorie density will be reviewed and  applied to three nutrition priorities: weight maintenance, weight loss, and weight gain. The purpose of this lesson is to reinforce (in a group setting) the key concepts around what patients are recommended to eat and how to apply these guidelines when away from home by planning and selecting Pritikin-friendly options. Patients will understand how calorie density may be adjusted for different weight management goals.  Mindful Eating  Clinical staff led group instruction and group discussion with PowerPoint presentation and patient guidebook. To enhance the learning environment the use of posters, models and videos may be added. Patients will briefly review the concepts of the Pritikin Eating Plan and the importance of low-calorie dense foods. The concept of mindful eating will be introduced as well as the importance of paying attention to internal hunger signals. Triggers for non-hunger eating and techniques for dealing with triggers will be explored. The purpose of this lesson is to provide patients with the opportunity to review the basic principles of the Pritikin Eating Plan, discuss the value of eating mindfully and how to measure internal cues of hunger and fullness using the Hunger Scale. Patients will also discuss reasons for non-hunger eating and learn strategies to use for controlling emotional eating.  Targeting Your Nutrition Priorities Clinical staff led group instruction and group discussion with PowerPoint presentation and patient guidebook. To enhance the learning environment the use of posters, models and videos may be added. Patients will learn how to determine their genetic susceptibility to disease by reviewing their family history. Patients will gain insight into the importance of diet as part of an overall healthy lifestyle in mitigating the impact of genetics and other environmental insults. The purpose of this lesson is to provide patients with the opportunity to assess their personal  nutrition priorities by looking at their family history, their own health history and current risk factors. Patients will also be able to discuss ways of prioritizing and modifying the Pritikin Eating Plan for their highest risk areas  Menu  Clinical staff led group instruction and group discussion with PowerPoint presentation and patient guidebook. To enhance the learning environment the use of posters, models and videos may be added. Using menus brought in from [employer], or printed from [employer], patients will apply the Pritikin dining out guidelines that were presented in the [employer] video. Patients will also be able to practice these guidelines in a variety of provided scenarios. The purpose of this lesson is to provide patients with the opportunity to practice hands-on learning of the Pritikin Dining Out guidelines with actual menus and practice scenarios.  Label Reading Clinical staff led group instruction and group discussion with PowerPoint presentation and patient guidebook. To enhance the learning environment the use of posters, models and videos may be added. Patients will review and discuss the Pritikin label reading guidelines presented in Pritikin's Label Reading Educational series video. Using fool labels brought in from local grocery stores and markets, patients will apply the label reading guidelines and determine if the packaged food meet the Pritikin guidelines. The purpose of this lesson is to provide patients with the opportunity to review, discuss, and practice hands-on learning of the Pritikin Label Reading guidelines with actual packaged food labels. Cooking School  Pritikin's [employer] are designed to teach patients ways to prepare quick, simple, and affordable recipes at home. The importance of nutrition's role in chronic disease risk reduction is reflected in its emphasis in the overall Pritikin program. By learning how to prepare  essential core Pritikin Eating Plan recipes, patients will increase control over what they eat; be able to customize the flavor of foods without the use of added salt, sugar, or fat; and improve the quality of the food they consume. By learning a set of core recipes which are easily assembled, quickly prepared, and affordable, patients are more likely to prepare more healthy foods at home. These workshops focus on convenient breakfasts, simple entres, side dishes, and desserts which can be prepared with minimal effort and are consistent with nutrition recommendations for cardiovascular risk reduction. Cooking [employer] are taught by a Armed forces logistics/support/administrative officer (RD) who has been trained by the [employer]. The chef or RD has a clear understanding of the importance of minimizing - if not completely eliminating - added fat, sugar, and sodium in recipes. Throughout the series of Cooking School Workshop sessions, patients will learn about healthy ingredients and efficient methods of cooking to build confidence in their capability to prepare    Cooking School weekly topics:  Adding Flavor- Sodium-Free  Fast and Healthy Breakfasts  Powerhouse Plant-Based Proteins  Satisfying Salads and Dressings  Simple Sides and Sauces  International Cuisine-Spotlight on the [employer] Zones  Delicious Desserts  Savory Soups  [employer] - Meals in a Astronomer Appetizers and Snacks  Comforting Weekend Breakfasts  One-Pot Wonders   Fast Evening Meals  Landscape architect Your Pritikin Plate  WORKSHOPS   Healthy Mindset (Psychosocial):  Focused Goals, Sustainable Changes Clinical staff led group instruction and group discussion with PowerPoint presentation and patient guidebook. To enhance the learning environment the use of posters, models and videos may be added. Patients will be able to apply effective goal setting strategies to establish at least one personal goal, and  then take consistent, meaningful action toward that goal. They will learn to identify common barriers to achieving personal goals and develop strategies to overcome them. Patients will also gain an understanding of how our  mind-set can impact our ability to achieve goals and the importance of cultivating a positive and growth-oriented mind-set. The purpose of this lesson is to provide patients with a deeper understanding of how to set and achieve personal goals, as well as the tools and strategies needed to overcome common obstacles which may arise along the way.  From Head to Heart: The Power of a Healthy Outlook  Clinical staff led group instruction and group discussion with PowerPoint presentation and patient guidebook. To enhance the learning environment the use of posters, models and videos may be added. Patients will be able to recognize and describe the impact of emotions and mood on physical health. They will discover the importance of self-care and explore self-care practices which may work for them. Patients will also learn how to utilize the 4 C's to cultivate a healthier outlook and better manage stress and challenges. The purpose of this lesson is to demonstrate to patients how a healthy outlook is an essential part of maintaining good health, especially as they continue their cardiac rehab journey.  Healthy Sleep for a Healthy Heart Clinical staff led group instruction and group discussion with PowerPoint presentation and patient guidebook. To enhance the learning environment the use of posters, models and videos may be added. At the conclusion of this workshop, patients will be able to demonstrate knowledge of the importance of sleep to overall health, well-being, and quality of life. They will understand the symptoms of, and treatments for, common sleep disorders. Patients will also be able to identify daytime and nighttime behaviors which impact sleep, and they will be able to apply these  tools to help manage sleep-related challenges. The purpose of this lesson is to provide patients with a general overview of sleep and outline the importance of quality sleep. Patients will learn about a few of the most common sleep disorders. Patients will also be introduced to the concept of "sleep hygiene," and discover ways to self-manage certain sleeping problems through simple daily behavior changes. Finally, the workshop will motivate patients by clarifying the links between quality sleep and their goals of heart-healthy living.   Recognizing and Reducing Stress Clinical staff led group instruction and group discussion with PowerPoint presentation and patient guidebook. To enhance the learning environment the use of posters, models and videos may be added. At the conclusion of this workshop, patients will be able to understand the types of stress reactions, differentiate between acute and chronic stress, and recognize the impact that chronic stress has on their health. They will also be able to apply different coping mechanisms, such as reframing negative self-talk. Patients will have the opportunity to practice a variety of stress management techniques, such as deep abdominal breathing, progressive muscle relaxation, and/or guided imagery.  The purpose of this lesson is to educate patients on the role of stress in their lives and to provide healthy techniques for coping with it.  Learning Barriers/Preferences:  Learning Barriers/Preferences - 12/04/23 1412       Learning Barriers/Preferences   Learning Barriers Sight   reading glasses   Learning Preferences Audio;Computer/Internet;Group Instruction;Individual Instruction;Pictoral;Skilled Demonstration;Verbal Instruction;Video;Written Material             Education Topics:  Knowledge Questionnaire Score:  Knowledge Questionnaire Score - 12/04/23 1412       Knowledge Questionnaire Score   Pre Score 24/24             Core  Components/Risk Factors/Patient Goals at Admission:  Personal Goals and Risk Factors at Admission -  12/04/23 1413       Core Components/Risk Factors/Patient Goals on Admission    Weight Management Yes;Obesity;Weight Loss    Intervention Weight Management: Develop a combined nutrition and exercise program designed to reach desired caloric intake, while maintaining appropriate intake of nutrient and fiber, sodium and fats, and appropriate energy expenditure required for the weight goal.;Weight Management: Provide education and appropriate resources to help participant work on and attain dietary goals.;Weight Management/Obesity: Establish reasonable short term and long term weight goals.;Obesity: Provide education and appropriate resources to help participant work on and attain dietary goals.    Expected Outcomes Short Term: Continue to assess and modify interventions until short term weight is achieved;Long Term: Adherence to nutrition and physical activity/exercise program aimed toward attainment of established weight goal;Weight Loss: Understanding of general recommendations for a balanced deficit meal plan, which promotes 1-2 lb weight loss per week and includes a negative energy balance of [phone] kcal/d;Understanding recommendations for meals to include 15-35% energy as protein, 25-35% energy from fat, 35-60% energy from carbohydrates, less than 200mg  of dietary cholesterol, 20-35 gm of total fiber daily;Understanding of distribution of calorie intake throughout the day with the consumption of 4-5 meals/snacks    Diabetes Yes    Intervention Provide education about signs/symptoms and action to take for hypo/hyperglycemia.;Provide education about proper nutrition, including hydration, and aerobic/resistive exercise prescription along with prescribed medications to achieve blood glucose in normal ranges: Fasting glucose 65-99 mg/dL    Expected Outcomes Short Term: Participant verbalizes understanding of  the signs/symptoms and immediate care of hyper/hypoglycemia, proper foot care and importance of medication, aerobic/resistive exercise and nutrition plan for blood glucose control.;Long Term: Attainment of HbA1C < 7%.    Heart Failure Yes    Intervention Provide a combined exercise and nutrition program that is supplemented with education, support and counseling about heart failure. Directed toward relieving symptoms such as shortness of breath, decreased exercise tolerance, and extremity edema.    Expected Outcomes Improve functional capacity of life;Short term: Attendance in program 2-3 days a week with increased exercise capacity. Reported lower sodium intake. Reported increased fruit and vegetable intake. Reports medication compliance.;Short term: Daily weights obtained and reported for increase. Utilizing diuretic protocols set by physician.;Long term: Adoption of self-care skills and reduction of barriers for early signs and symptoms recognition and intervention leading to self-care maintenance.    Hypertension Yes    Intervention Provide education on lifestyle modifcations including regular physical activity/exercise, weight management, moderate sodium restriction and increased consumption of fresh fruit, vegetables, and low fat dairy, alcohol moderation, and smoking cessation.;Monitor prescription use compliance.    Expected Outcomes Short Term: Continued assessment and intervention until BP is < 140/10mm HG in hypertensive participants. < 130/24mm HG in hypertensive participants with diabetes, heart failure or chronic kidney disease.;Long Term: Maintenance of blood pressure at goal levels.    Lipids Yes    Intervention Provide education and support for participant on nutrition & aerobic/resistive exercise along with prescribed medications to achieve LDL 70mg , HDL >40mg .    Expected Outcomes Short Term: Participant states understanding of desired cholesterol values and is compliant with medications  prescribed. Participant is following exercise prescription and nutrition guidelines.;Long Term: Cholesterol controlled with medications as prescribed, with individualized exercise RX and with personalized nutrition plan. Value goals: LDL < 70mg , HDL > 40 mg.             Core Components/Risk Factors/Patient Goals Review:   Goals and Risk Factor Review     Row Name  12/10/23 1610 12/13/23 [id number] 01/09/24 1543 02/11/24 1346       Core Components/Risk Factors/Patient Goals Review   Personal Goals Review Weight Management/Obesity;Heart Failure;Lipids;Hypertension;Diabetes Weight Management/Obesity;Heart Failure;Lipids;Hypertension;Diabetes Weight Management/Obesity;Heart Failure;Lipids;Hypertension;Diabetes Weight Management/Obesity;Heart Failure;Lipids;Hypertension;Diabetes    Review Byron started cardiac rehab on 12/09/23. Rucker did well with exercise. Vital signs and CBG's were stable. Draylon does not check his CBG's at home. Spot checked CBG's at cardiac rehab. Olufemi started cardiac rehab on 12/09/23. Hiroki is off to a good start  with exercise. Vital signs and CBG's have been stable. Caldwell does not check his CBG's at home. Spot checked CBG's at cardiac rehab. Dontea has lost 1.5 kg since starting cardiac rehab. Nyjah is doing well with exercise at cardiac rehab. Vital signs have been stable. Macklin is doing well with exercise at cardiac rehab. Vital signs have been stable. Bayan has lost 3.7 kg since starting cardiac rehab. minimal  ventricular ectopy has have been noted since Maverick has been taking mexitile.    Expected Outcomes Kalel will continue to participate in cardiac rehab for exercise, nutrition and lifestyle modifications Marquez will continue to participate in cardiac rehab for exercise, nutrition and lifestyle modifications Murl will continue to participate in cardiac rehab for exercise, nutrition and lifestyle modifications Gotti will continue to participate in cardiac rehab for  exercise, nutrition and lifestyle modifications             Core Components/Risk Factors/Patient Goals at Discharge (Final Review):   Goals and Risk Factor Review - 02/11/24 1346       Core Components/Risk Factors/Patient Goals Review   Personal Goals Review Weight Management/Obesity;Heart Failure;Lipids;Hypertension;Diabetes    Review Crandall is doing well with exercise at cardiac rehab. Vital signs have been stable. Tylar has lost 3.7 kg since starting cardiac rehab. minimal  ventricular ectopy has have been noted since Yahia has been taking mexitile.    Expected Outcomes Cartier will continue to participate in cardiac rehab for exercise, nutrition and lifestyle modifications             ITP Comments:  ITP Comments     Row Name 12/04/23 1407 12/10/23 0942 12/13/23 0928 01/09/24 1540 02/11/24 1339   ITP Comments Dr. Armanda Magic medical director. Introduction to pritikin education/intensive cardiac rehab. Initial orientation packet reviewed with patient. 30 Day ITP Review. Bilal started cardiac rehab on 12/09/23. Colston did well with exercise. 30 Day ITP Review. Lakendrick started cardiac rehab on 12/09/23. Jaxxon is off to a good start  with exercise. 30 Day ITP Review. Muaad has good attendance and participation in  cardiac rehab 30 Day ITP Review. Fontaine continues to have good attendance and participation in  cardiac rehab. Kacy will complete cardiac rehab tenatively on 02/28/24            Comments: See ITP comments.Thayer Headings RN BSN

## 2024-02-12 ENCOUNTER — Telehealth: Payer: Self-pay

## 2024-02-12 ENCOUNTER — Ambulatory Visit: Admitting: Licensed Clinical Social Worker

## 2024-02-12 ENCOUNTER — Other Ambulatory Visit (HOSPITAL_COMMUNITY): Payer: Self-pay

## 2024-02-12 ENCOUNTER — Encounter (HOSPITAL_COMMUNITY)
Admission: RE | Admit: 2024-02-12 | Discharge: 2024-02-12 | Disposition: A | Discharge status: Home / Self Care | Payer: 59 | Source: Ambulatory Visit | Attending: Internal Medicine | Admitting: Internal Medicine

## 2024-02-12 DIAGNOSIS — I5022 Chronic systolic (congestive) heart failure: Secondary | ICD-10-CM | POA: Diagnosis present

## 2024-02-12 DIAGNOSIS — F419 Anxiety disorder, unspecified: Secondary | ICD-10-CM

## 2024-02-12 NOTE — Telephone Encounter (Signed)
 Pharmacy Patient Advocate Encounter  Received notification from [facility] that Prior Authorization for VASCEPA has been APPROVED from 02/12/24 to 02/12/27. Ran test claim, Copay is $16. This test claim was processed through [facility]- copay amounts may vary at other pharmacies due to pharmacy/plan contracts, or as the patient moves through the different stages of their insurance plan.

## 2024-02-12 NOTE — Telephone Encounter (Signed)
 Pharmacy Patient Advocate Encounter   Received notification from CoverMyMeds that prior authorization for VASCEPA is required/requested.   Insurance verification completed.   The patient is insured through [facility] .   Per test claim: PA required; PA submitted to above mentioned insurance via CoverMyMeds Key/confirmation #/EOC [facility] Status is pending

## 2024-02-12 NOTE — Progress Notes (Addendum)
 [facility] Counselor/Therapist Progress Note  Patient ID: Jason Floyd, MRN: [id number]    Date: 02/12/24  Time Spent: 11:06  am - 12:01 pm : 55 Minutes  Treatment Type: Individual Therapy.  Reported Symptoms: has been exercising and losing weight which has been making him feel better, worrying about the sale of the houses, hyper focused on unknown things  Mental Status Exam: Appearance:  Casual     Behavior: Appropriate  Motor: Normal  Speech/Language:  Normal Rate  Affect: Appropriate  Mood: normal  Thought process: normal  Thought content:   WNL  Sensory/Perceptual disturbances:   WNL  Orientation: oriented to person, place, and time/date  Attention: Good  Concentration: Good  Memory: WNL  Fund of knowledge:  Good  Insight:   Good  Judgment:  Good  Impulse Control: Good   Risk Assessment: Danger to Self:  No Self-injurious Behavior: No Danger to Others: No Duty to Warn:no Physical Aggression / Violence:No  Access to Firearms a concern: No  Gang Involvement:No   Subjective:   Jason Floyd participated in the session, in person in the office with the therapist, and consented to treatment Jason Floyd reviewed the events of the past week.      We reviewed numerous treatment approaches including CBT and Solution focused therapy. Psych-education regarding the Ryot's diagnosis of anxiety was provided during the session. We discussed Jason Floyd's goals treatment goals which include goal of identifying concerns and anxiety stressors in order to manage time and organization in an effort to reduce anxiety.  Focused on eating and exercise to manage cardiac health.  Learning guided visualizations and mindfulness strategies to reduce tension in shoulders, neck and body. Jason Floyd provided verbal approval of the treatment plan.    Interventions: Psycho-education & Goal Setting.   Diagnosis:   Anxiety  Psychiatric  Treatment: No , N/A  Treatment Plan:  Client Abilities/Strengths Jason Floyd is open and committed to attending sessions in person.  Support System: Family and Friends   Hospital doctor Preferences In person   Client Statement of Needs Jason Floyd would like to learn how to slow his anxiety down to be able to move into his new house, sell his mothers house, and sell the current house.  Stressing over this currently   Treatment Level Biweekly  Symptoms  Overwhelmed   (Status: maintained) Anxious   (Status: maintained)  Goals:   Jason Floyd agreed to setting goal of identifying concerns and anxiety stressors in order to manage time and organization in an effort to reduce anxiety.  Focused on eating and exercise to manage cardiac health.  Learning guided visualizations and mindfulness strategies to reduce tension in shoulders, neck and body.    Treatment plan signed and available on s-drive:  Yes    Target Date: 03/25/24 Frequency: Biweekly  Progress: 0 Modality: individual    Therapist will provide referrals for additional resources as appropriate.  Therapist will provide psycho-education regarding Jason Floyd's diagnosis and corresponding treatment approaches and interventions. Licensed Clinical Mental Health Counselor, Anselmo Pickler, [facility], will support the patient's ability to achieve the goals identified. will employ CBT, BA, Problem-solving, Solution Focused, Mindfulness,  coping skills, & other evidenced-based practices will be used to promote progress towards healthy functioning to help manage decrease symptoms associated with his diagnosis.   Reduce overall level, frequency, and intensity of the feelings of depression, anxiety and panic evidenced by decreased anxious thoughts and Verbally express understanding of the relationship between feelings of depression, anxiety and their impact  on thinking patterns and behaviors. Verbalize an understanding of the role that distorted thinking  plays in creating fears, excessive worry, and ruminations.  Jason Floyd participated in the creation of the treatment plan)    Anselmo Pickler, [facility]

## 2024-02-13 ENCOUNTER — Telehealth: Payer: Self-pay | Admitting: Pharmacy Technician

## 2024-02-13 ENCOUNTER — Other Ambulatory Visit (HOSPITAL_COMMUNITY): Payer: Self-pay

## 2024-02-13 NOTE — Telephone Encounter (Signed)
 Pharmacy Patient Advocate Encounter   Received notification from CoverMyMeds that prior authorization for Repatha is required/requested.   Insurance verification completed.   The patient is insured through [facility] [employer] .   Per test claim: PA required; PA submitted to above mentioned insurance via CoverMyMeds Key/confirmation #/EOC [id number] Status is pending

## 2024-02-13 NOTE — Telephone Encounter (Signed)
 Pharmacy Patient Advocate Encounter  Received notification from [facility] [employer] that Prior Authorization for repatha has been APPROVED from 02/13/24 to 02/12/25. Unable to obtain price due to refill too soon rejection, last fill date 02/13/24 next available fill date04/24/25   PA #/Case ID/Reference #: [id number]

## 2024-02-14 ENCOUNTER — Encounter (HOSPITAL_COMMUNITY): Payer: 59

## 2024-02-17 ENCOUNTER — Encounter (HOSPITAL_COMMUNITY)
Admission: RE | Admit: 2024-02-17 | Discharge: 2024-02-17 | Disposition: A | Discharge status: Home / Self Care | Payer: 59 | Source: Ambulatory Visit | Attending: Internal Medicine

## 2024-02-17 DIAGNOSIS — I5022 Chronic systolic (congestive) heart failure: Secondary | ICD-10-CM | POA: Diagnosis not present

## 2024-02-19 ENCOUNTER — Encounter (HOSPITAL_COMMUNITY)
Admission: RE | Admit: 2024-02-19 | Discharge: 2024-02-19 | Disposition: A | Discharge status: Home / Self Care | Payer: 59 | Source: Ambulatory Visit | Attending: Internal Medicine

## 2024-02-19 DIAGNOSIS — I5022 Chronic systolic (congestive) heart failure: Secondary | ICD-10-CM

## 2024-02-20 ENCOUNTER — Ambulatory Visit (INDEPENDENT_AMBULATORY_CARE_PROVIDER_SITE_OTHER): Payer: BC Managed Care – PPO

## 2024-02-20 DIAGNOSIS — I255 Ischemic cardiomyopathy: Secondary | ICD-10-CM | POA: Diagnosis not present

## 2024-02-20 LAB — CUP PACEART REMOTE DEVICE CHECK
Battery Remaining Longevity: 126 mo
Battery Remaining Percentage: 100 %
Brady Statistic RA Percent Paced: 0 %
Brady Statistic RV Percent Paced: 0 %
Date Time Interrogation Session: 20250410004100
HighPow Impedance: 82 Ohm
Implantable Lead Connection Status: 753985
Implantable Lead Connection Status: 753985
Implantable Lead Implant Date: 20211011
Implantable Lead Implant Date: 20211011
Implantable Lead Location: 753859
Implantable Lead Location: 753860
Implantable Lead Model: 138
Implantable Lead Model: 7841
Implantable Lead Serial Number: 1097338
Implantable Lead Serial Number: 303261
Implantable Pulse Generator Implant Date: 20211011
Lead Channel Impedance Value: 644 Ohm
Lead Channel Impedance Value: 664 Ohm
Lead Channel Setting Pacing Amplitude: 2.5 V
Lead Channel Setting Pacing Amplitude: 2.5 V
Lead Channel Setting Pacing Pulse Width: 0.4 ms
Lead Channel Setting Sensing Sensitivity: 0.5 mV
Pulse Gen Serial Number: 228262
Zone Setting Status: 755011

## 2024-02-21 ENCOUNTER — Encounter (HOSPITAL_COMMUNITY)
Admission: RE | Admit: 2024-02-21 | Discharge: 2024-02-21 | Disposition: A | Discharge status: Home / Self Care | Payer: 59 | Source: Ambulatory Visit | Attending: Internal Medicine | Admitting: Internal Medicine

## 2024-02-21 DIAGNOSIS — I5022 Chronic systolic (congestive) heart failure: Secondary | ICD-10-CM

## 2024-02-22 ENCOUNTER — Encounter: Payer: Self-pay | Admitting: Internal Medicine

## 2024-02-24 ENCOUNTER — Encounter (HOSPITAL_COMMUNITY)
Admission: RE | Admit: 2024-02-24 | Discharge: 2024-02-24 | Disposition: A | Discharge status: Home / Self Care | Payer: 59 | Source: Ambulatory Visit | Attending: Internal Medicine

## 2024-02-24 VITALS — Ht 74.0 in | Wt 229.1 lb

## 2024-02-24 DIAGNOSIS — I5022 Chronic systolic (congestive) heart failure: Secondary | ICD-10-CM

## 2024-02-26 ENCOUNTER — Ambulatory Visit (INDEPENDENT_AMBULATORY_CARE_PROVIDER_SITE_OTHER): Admitting: Licensed Clinical Social Worker

## 2024-02-26 ENCOUNTER — Encounter (HOSPITAL_COMMUNITY)
Admission: RE | Admit: 2024-02-26 | Discharge: 2024-02-26 | Disposition: A | Discharge status: Home / Self Care | Payer: 59 | Source: Ambulatory Visit | Attending: Internal Medicine | Admitting: Internal Medicine

## 2024-02-26 DIAGNOSIS — I5022 Chronic systolic (congestive) heart failure: Secondary | ICD-10-CM | POA: Diagnosis not present

## 2024-02-26 DIAGNOSIS — F419 Anxiety disorder, unspecified: Secondary | ICD-10-CM | POA: Diagnosis not present

## 2024-02-26 NOTE — Progress Notes (Signed)
 Discharge Progress Report  Patient Details  Name: Jason Floyd MRN: [id number] Date of Birth: 06-10-1966 Referring Provider:   Flowsheet Row INTENSIVE CARDIAC REHAB ORIENT from 12/04/2023 in [facility]  Referring Provider Arvilla Meres, MD        Number of Visits: 80  Reason for Discharge:  Patient reached a stable level of exercise. Patient independent in their exercise. Patient has met program and personal goals.  Smoking History:  Social History   Tobacco Use  Smoking Status Former   Current packs/day: 0.00   Average packs/day: 0.3 packs/day for 31.0 years (7.8 ttl pk-yrs)   Types: Cigarettes   Quit date: 07/15/2023   Years since quitting: 0.6  Smokeless Tobacco Never  Tobacco Comments   Former smoker 11/01/23    Diagnosis:  Heart failure, chronic systolic (HCC)  ADL UCSD:   Initial Exercise Prescription:  Initial Exercise Prescription - 12/04/23 1500       Date of Initial Exercise RX and Referring Provider   Date 12/04/23    Referring Provider Arvilla Meres, MD    Expected Discharge Date 02/26/24      Treadmill   MPH 2.8    Grade 0    Minutes 15    METs 3.5      Bike   Level 1    Watts 60    Minutes 15    METs 3      Prescription Details   Frequency (times per week) 3    Duration Progress to 30 minutes of continuous aerobic without signs/symptoms of physical distress      Intensity   THRR 40-80% of Max Heartrate 65-130    Ratings of Perceived Exertion 11-13    Perceived Dyspnea 0-4      Progression   Progression Continue progressive overload as per policy without signs/symptoms or physical distress.      Resistance Training   Training Prescription Yes    Weight 4    Reps 10-15             Discharge Exercise Prescription (Final Exercise Prescription Changes):  Exercise Prescription Changes - 02/26/24 1500       Response to Exercise   Blood Pressure  (Admit) 98/66    Blood Pressure (Exit) 96/62    Heart Rate (Admit) 74 bpm    Heart Rate (Exercise) 101 bpm    Heart Rate (Exit) 83 bpm    Rating of Perceived Exertion (Exercise) 11    Symptoms None    Comments Pt graduated from the CRP2 program today    Duration Continue with 30 min of aerobic exercise without signs/symptoms of physical distress.    Intensity THRR unchanged      Progression   Progression Continue to progress workloads to maintain intensity without signs/symptoms of physical distress.    Average METs 3.6      Resistance Training   Training Prescription No    Weight No weights on Wednesdays      Interval Training   Interval Training No      Treadmill   MPH 2.8    Grade 2.5    Minutes 15    METs 4.11      Bike   Level 5    Watts 33.4    Minutes 15    METs 3.02      Home Exercise Plan   Plans to continue exercise at Home (comment)    Frequency Add 2  additional days to program exercise sessions.    Initial Home Exercises Provided 12/27/23             Functional Capacity:  6 Minute Walk     Row Name 12/04/23 1518 02/21/24 1252       6 Minute Walk   Phase Initial Discharge    Distance 1620 feet 2040 feet    Distance % Change -- 25.93 %    Distance Feet Change -- 420 ft    Walk Time 6 minutes 6 minutes    # of Rest Breaks 0 0    MPH 3.07 3.9    METS 3.94 4.43    RPE 9 13    Perceived Dyspnea  0 0    VO2 Peak 13.8 15.5    Symptoms Yes (comment) Yes (comment)    Comments shin tightness- resolved with rest shin pain 4/10- resolved with rest    Resting HR 72 bpm 73 bpm    Resting BP 104/62 100/62    Resting Oxygen Saturation  98 % --    Exercise Oxygen Saturation  during 6 min walk 98 % --    Max Ex. HR 107 bpm 78 bpm    Max Ex. BP 120/70 110/70    2 Minute Post BP 108/72 --             Psychological, QOL, Others - Outcomes: PHQ 2/9:    02/26/2024    1:55 PM 12/18/2023   10:18 AM 12/04/2023    2:10 PM 07/05/2023   12:43 PM  07/25/2022   10:57 AM  Depression screen PHQ 2/9  Decreased Interest 0 0 0 0 0  Down, Depressed, Hopeless 0 1 0 0 1  PHQ - 2 Score 0 1 0 0 1  Altered sleeping 0 1 0 1 0  Tired, decreased energy 0 0 1 1 0  Change in appetite 0 1 1 1 1   Feeling bad or failure about yourself  0 0 0 0 0  Trouble concentrating 0 1 0 0 0  Moving slowly or fidgety/restless 0 0 0 0 0  Suicidal thoughts 0 0 0 0 0  PHQ-9 Score 0 4 2 3 2   Difficult doing work/chores Not difficult at all Somewhat difficult Not difficult at all Not difficult at all Not difficult at all    Quality of Life:  Quality of Life - 02/25/24 0850       Quality of Life Scores   Health/Function Pre 18.97 %    Health/Function Post 22.43 %    Health/Function % Change 18.24 %    Socioeconomic Pre 20.5 %    Socioeconomic Post 27 %    Socioeconomic % Change  31.71 %    Psych/Spiritual Pre 20.25 %    Psych/Spiritual Post 21.42 %    Psych/Spiritual % Change 5.78 %    Family Pre 22.5 %    Family Post 25.5 %    Family % Change 13.33 %    GLOBAL Pre 19.98 %    GLOBAL Post 23.63 %    GLOBAL % Change 18.27 %             Personal Goals: Goals established at orientation with interventions provided to work toward goal.  Personal Goals and Risk Factors at Admission - 12/04/23 1413       Core Components/Risk Factors/Patient Goals on Admission    Weight Management Yes;Obesity;Weight Loss    Intervention Weight Management: Develop a combined nutrition and exercise  program designed to reach desired caloric intake, while maintaining appropriate intake of nutrient and fiber, sodium and fats, and appropriate energy expenditure required for the weight goal.;Weight Management: Provide education and appropriate resources to help participant work on and attain dietary goals.;Weight Management/Obesity: Establish reasonable short term and long term weight goals.;Obesity: Provide education and appropriate resources to help participant work on and attain  dietary goals.    Expected Outcomes Short Term: Continue to assess and modify interventions until short term weight is achieved;Long Term: Adherence to nutrition and physical activity/exercise program aimed toward attainment of established weight goal;Weight Loss: Understanding of general recommendations for a balanced deficit meal plan, which promotes 1-2 lb weight loss per week and includes a negative energy balance of [phone] kcal/d;Understanding recommendations for meals to include 15-35% energy as protein, 25-35% energy from fat, 35-60% energy from carbohydrates, less than 200mg  of dietary cholesterol, 20-35 gm of total fiber daily;Understanding of distribution of calorie intake throughout the day with the consumption of 4-5 meals/snacks    Diabetes Yes    Intervention Provide education about signs/symptoms and action to take for hypo/hyperglycemia.;Provide education about proper nutrition, including hydration, and aerobic/resistive exercise prescription along with prescribed medications to achieve blood glucose in normal ranges: Fasting glucose 65-99 mg/dL    Expected Outcomes Short Term: Participant verbalizes understanding of the signs/symptoms and immediate care of hyper/hypoglycemia, proper foot care and importance of medication, aerobic/resistive exercise and nutrition plan for blood glucose control.;Long Term: Attainment of HbA1C < 7%.    Heart Failure Yes    Intervention Provide a combined exercise and nutrition program that is supplemented with education, support and counseling about heart failure. Directed toward relieving symptoms such as shortness of breath, decreased exercise tolerance, and extremity edema.    Expected Outcomes Improve functional capacity of life;Short term: Attendance in program 2-3 days a week with increased exercise capacity. Reported lower sodium intake. Reported increased fruit and vegetable intake. Reports medication compliance.;Short term: Daily weights obtained and  reported for increase. Utilizing diuretic protocols set by physician.;Long term: Adoption of self-care skills and reduction of barriers for early signs and symptoms recognition and intervention leading to self-care maintenance.    Hypertension Yes    Intervention Provide education on lifestyle modifcations including regular physical activity/exercise, weight management, moderate sodium restriction and increased consumption of fresh fruit, vegetables, and low fat dairy, alcohol moderation, and smoking cessation.;Monitor prescription use compliance.    Expected Outcomes Short Term: Continued assessment and intervention until BP is < 140/52mm HG in hypertensive participants. < 130/52mm HG in hypertensive participants with diabetes, heart failure or chronic kidney disease.;Long Term: Maintenance of blood pressure at goal levels.    Lipids Yes    Intervention Provide education and support for participant on nutrition & aerobic/resistive exercise along with prescribed medications to achieve LDL 70mg , HDL >40mg .    Expected Outcomes Short Term: Participant states understanding of desired cholesterol values and is compliant with medications prescribed. Participant is following exercise prescription and nutrition guidelines.;Long Term: Cholesterol controlled with medications as prescribed, with individualized exercise RX and with personalized nutrition plan. Value goals: LDL < 70mg , HDL > 40 mg.              Personal Goals Discharge:  Goals and Risk Factor Review     Row Name 12/10/23 0951 12/13/23 0929 01/09/24 1543 02/11/24 1346 02/27/24 0947     Core Components/Risk Factors/Patient Goals Review   Personal Goals Review Weight Management/Obesity;Heart Failure;Lipids;Hypertension;Diabetes Weight Management/Obesity;Heart Failure;Lipids;Hypertension;Diabetes Weight Management/Obesity;Heart Failure;Lipids;Hypertension;Diabetes Weight Management/Obesity;Heart  Failure;Lipids;Hypertension;Diabetes Weight  Management/Obesity;Heart Failure;Lipids;Hypertension;Diabetes   Review Tremane started cardiac rehab on 12/09/23. Malacki did well with exercise. Vital signs and CBG's were stable. Craig does not check his CBG's at home. Spot checked CBG's at cardiac rehab. Mase started cardiac rehab on 12/09/23. Court is off to a good start  with exercise. Vital signs and CBG's have been stable. Heyward does not check his CBG's at home. Spot checked CBG's at cardiac rehab. Jamieon has lost 1.5 kg since starting cardiac rehab. Michial is doing well with exercise at cardiac rehab. Vital signs have been stable. Kellis is doing well with exercise at cardiac rehab. Vital signs have been stable. Nicklas has lost 3.7 kg since starting cardiac rehab. minimal  ventricular ectopy has have been noted since Berwyn has been taking mexitile. Champ did well with exercise at cardiac rehab. Vital signs have been stable. Ramelo has lost 4.5 kg since starting cardiac rehab. Euel completed cardiac rehab on 02/26/24   Expected Outcomes Ruhan will continue to participate in cardiac rehab for exercise, nutrition and lifestyle modifications Seddrick will continue to participate in cardiac rehab for exercise, nutrition and lifestyle modifications Locklan will continue to participate in cardiac rehab for exercise, nutrition and lifestyle modifications Esker will continue to participate in cardiac rehab for exercise, nutrition and lifestyle modifications Adiel will continue to participate in cardiac rehab for exercise, nutrition and lifestyle modifications            Exercise Goals and Review:  Exercise Goals     Row Name 12/04/23 1411             Exercise Goals   Increase Physical Activity Yes       Intervention Develop an individualized exercise prescription for aerobic and resistive training based on initial evaluation findings, risk stratification, comorbidities and participant's personal goals.;Provide advice, education, support  and counseling about physical activity/exercise needs.       Expected Outcomes Short Term: Attend rehab on a regular basis to increase amount of physical activity.;Long Term: Exercising regularly at least 3-5 days a week.;Long Term: Add in home exercise to make exercise part of routine and to increase amount of physical activity.       Increase Strength and Stamina Yes       Intervention Provide advice, education, support and counseling about physical activity/exercise needs.;Develop an individualized exercise prescription for aerobic and resistive training based on initial evaluation findings, risk stratification, comorbidities and participant's personal goals.       Expected Outcomes Short Term: Increase workloads from initial exercise prescription for resistance, speed, and METs.;Short Term: Perform resistance training exercises routinely during rehab and add in resistance training at home;Long Term: Improve cardiorespiratory fitness, muscular endurance and strength as measured by increased METs and functional capacity ( )       Able to understand and use rate of perceived exertion (RPE) scale Yes       Intervention Provide education and explanation on how to use RPE scale       Expected Outcomes Short Term: Able to use RPE daily in rehab to express subjective intensity level;Long Term:  Able to use RPE to guide intensity level when exercising independently       Knowledge and understanding of Target Heart Rate Range (THRR) Yes       Intervention Provide education and explanation of THRR including how the numbers were predicted and where they are located for reference       Expected Outcomes Short Term: Able to state/look up  THRR;Short Term: Able to use daily as guideline for intensity in rehab;Long Term: Able to use THRR to govern intensity when exercising independently       Understanding of Exercise Prescription Yes       Intervention Provide education, explanation, and written materials on  patient's individual exercise prescription       Expected Outcomes Short Term: Able to explain program exercise prescription;Long Term: Able to explain home exercise prescription to exercise independently                Exercise Goals Re-Evaluation:  Exercise Goals Re-Evaluation     Row Name 12/09/23 1436 01/08/24 1430 02/05/24 1500 02/26/24 1500       Exercise Goal Re-Evaluation   Exercise Goals Review Increase Physical Activity;Understanding of Exercise Prescription;Increase Strength and Stamina;Knowledge and understanding of Target Heart Rate Range (THRR);Able to understand and use rate of perceived exertion (RPE) scale Increase Physical Activity;Understanding of Exercise Prescription;Increase Strength and Stamina;Knowledge and understanding of Target Heart Rate Range (THRR);Able to understand and use rate of perceived exertion (RPE) scale Increase Physical Activity;Understanding of Exercise Prescription;Increase Strength and Stamina;Knowledge and understanding of Target Heart Rate Range (THRR);Able to understand and use rate of perceived exertion (RPE) scale Increase Physical Activity;Understanding of Exercise Prescription;Increase Strength and Stamina;Knowledge and understanding of Target Heart Rate Range (THRR);Able to understand and use rate of perceived exertion (RPE) scale    Comments Pt's first day in the CRP2 program. Pt understands the exericse Rx, RPE sclae and THRR. Reviewed METs and goals. Pt voices that his strength and stamina is improving. Pt is doing better with being consistent with an exercise rouitne by attending the CRP2 program. However, pt has not started on his home exercise due to work issues. Encouraged patient to try and schedule 30 minutes 2x/week for his home exercise. Pt agrees to try to begin his home program. Reviewed METs and goals. Pt continues to voice that his strength and stamina is improving. Pt is doing better with being consistent with an exercise rouitne by  attending the CRP2 program. Pt voices that he has only been able to get in 1 day per week of walking. Pt congratulated on that extra day of exercise and continue to encourage any extra activity outside of the CRP2 program. Pt graduated from the CRP2 program today. Pt voice progress on his goals of increased strength and stamina. Pt also met his goal of being conistent with an exercise routine. Pt has been attending the CRP2 program and walks 1x/week at home. I encouraged 30 minutes 5x week for the 150 min goal. Pt travles on weekneds with his dogs and finds it difficult to get the additonal day in. Peak METs were 4.1.    Expected Outcomes Will continue to monitor patient and progress exercise workloads as tolerated. Will continue to monitor patient and progress exercise workloads as tolerated. Will continue to monitor patient and progress exercise workloads as tolerated. Pt will continue to exercise at home by walking.             Nutrition & Weight - Outcomes:  Pre Biometrics - 12/04/23 1407       Pre Biometrics   Waist Circumference 48 inches    Hip Circumference 45 inches    Waist to Hip Ratio 1.07 %    Triceps Skinfold 24 mm    % Body Fat 33.3 %    Grip Strength 42 kg    Flexibility 13.25 in    Single Leg Stand 25 seconds  Post Biometrics - 02/21/24 1303        Post  Biometrics   Height 6\' 2"  (1.88 m)    Weight 103.9 kg    Waist Circumference 45 inches    Hip Circumference 43.25 inches    Waist to Hip Ratio 1.04 %    BMI (Calculated) 29.4    Triceps Skinfold 24 mm    % Body Fat 31.6 %    Grip Strength 38 kg    Flexibility 15.25 in    Single Leg Stand 30 seconds             Nutrition:  Nutrition Therapy & Goals - 02/14/24 1011       Nutrition Therapy   Diet Heart Healthy/Carbohydrate Consistent diet    Drug/Food Interactions Statins/Certain Fruits      Personal Nutrition Goals   Nutrition Goal Patient to identify strategies for reducing  cardiovascular risk by attending the [facility] education and nutrition series weekly.   goal in progress.   Personal Goal #2 Patient to improve diet quality by using the plate method as a guide for meal planning to include lean protein/plant protein, fruits, vegetables, whole grains, nonfat dairy as part of a well-balanced diet.   goal in progress.   Personal Goal #3 Patient to identify strategies for weight loss of 0.5-2.0# per week.   goal not met.   Comments Goals in progress. Patient has medical history of CHF, HTN, Hyperlipidemia, TIA, v-tach. A1c remains above goal; he continue ozempic. He has lost 8.4# since starting with our program. Lipids remain at goal (repatha, vascepa). He continues to attend the [facility] education and nutrition series regularly. Patient will benefit from participation in intensive cardiac rehab for nutrition, exercise, and lifestyle modification.      Intervention Plan   Intervention Prescribe, educate and counsel regarding individualized specific dietary modifications aiming towards targeted core components such as weight, hypertension, lipid management, diabetes, heart failure and other comorbidities.;Nutrition handout(s) given to patient.    Expected Outcomes Short Term Goal: Understand basic principles of dietary content, such as calories, fat, sodium, cholesterol and nutrients.;Long Term Goal: Adherence to prescribed nutrition plan.             Nutrition Discharge:   Education Questionnaire Score:  Knowledge Questionnaire Score - 02/25/24 0845       Knowledge Questionnaire Score   Post Score 24/24             Goals reviewed with patient; copy given to patient.Pt graduates from  Intensive/Traditional cardiac rehab program on 02/26/24 with completion of  29 exercise and  28 education sessions. Pt maintained good attendance and progressed nicely during their participation in rehab as evidenced by increased MET level.   Harace increased his distance on  his post exercise walk test by 420 feet. Kaicen made significant dietary changes and has lost 4.5 kg since starting cardiac rehab.  Medication list reconciled. Repeat  PHQ score- 0 .  Pt has made significant lifestyle changes and should be commended for his success. Clearnce achieved their goals during cardiac rehab.   Pt plans to continue exercise at home walking on the treadmill and using weights. We are proud of Romond's progress and weight loss!Monte Antonio RN BSN

## 2024-02-26 NOTE — Progress Notes (Signed)
 [facility] Counselor/Therapist Progress Note  Patient ID: Jason Floyd, MRN: [id number]    Date: 02/26/24  Time Spent: 10:01  am - 11:03 am : 62 Minutes  Treatment Type: Individual Therapy.  Reported Symptoms: feeling down about the state of the Country but optimistic that things can change and looking forward to living in hew house and getting it set up, follow up   Mental Status Exam: Appearance:  Casual     Behavior: Sharing  Motor: Normal  Speech/Language:  Normal Rate  Affect: Appropriate  Mood: normal  Thought process: normal  Thought content:   WNL  Sensory/Perceptual disturbances:   WNL  Orientation: oriented to person, place, and time/date  Attention: Good  Concentration: Good  Memory: WNL  Fund of knowledge:  Good  Insight:   Good  Judgment:  Good  Impulse Control: Good   Risk Assessment: Danger to Self:  No Self-injurious Behavior: No Danger to Others: No Duty to Warn:no Physical Aggression / Violence:No  Access to Firearms a concern: No  Gang Involvement:No   Subjective:   Jason Floyd participated in the session, in person in the office with the therapist, and consented to treatment Jason Floyd reviewed the events of the past week.      We reviewed numerous treatment approaches including CBT and Solution focused therapy. Psych-education regarding the Jason Floyd's diagnosis of Anxiety was provided during the session. We discussed Jason Floyd's goals treatment goals which include  identifying concerns and anxiety stressors in order to manage time and organization in an effort to reduce anxiety.  Focused on eating and exercise to manage cardiac health.  Learning guided visualizations and mindfulness strategies to reduce tension in shoulders, neck and body.    Jason Floyd provided verbal approval of the treatment plan.    Interventions: Psycho-education & Goal Setting.   Diagnosis:    Anxiety  Psychiatric Treatment: No , N/A  Treatment Plan:  Client Abilities/Strengths [name] is committed to the therapeutic process and attentive to sessions and self.    Support System: Family and Friends  Client Treatment Preferences In person  Client Statement of Needs Jason Floyd would like to focus on becoming more self aware based on his physical heart health issues.     Treatment Level Biweekly  Symptoms  Less Anxious   (Status: improved) Hopeful   (Status: improved)  Goals:   Jason Floyd agreed to setting goal of  identifying concerns and anxiety stressors in order to manage time and organization in an effort to reduce anxiety.  Focused on eating and exercise to manage cardiac health.  Learning guided visualizations and mindfulness strategies to reduce tension in shoulders, neck and body.  Will focus on indoor exercise at least 30 mins at least 3x week.      Treatment plan signed and available on s-drive:  Yes    Target Date: 03/25/24 Frequency: Weekly  Progress: 0 Modality: individual    Therapist will provide referrals for additional resources as appropriate.  Therapist will provide psycho-education regarding [name]'s diagnosis and corresponding treatment approaches and interventions. Licensed Clinical Mental Health Counselor, Anselmo Pickler, [facility], will support the patient's ability to achieve the goals identified. will employ CBT, BA, Problem-solving, Solution Focused, Mindfulness,  coping skills, & other evidenced-based practices will be used to promote progress towards healthy functioning to help manage decrease symptoms associated with his diagnosis.   Reduce overall level, frequency, and intensity of the feelings of depression, anxiety and panic evidenced by decreased anxious thoughts  and ideas. Verbally express understanding of the relationship between feelings of depression, anxiety and their impact on thinking patterns and behaviors. Verbalize an understanding  of the role that distorted thinking plays in creating fears, excessive worry, and ruminations.  Alisia Irons participated in the creation of the treatment plan)    Grandville Lax, LCMHC

## 2024-02-28 ENCOUNTER — Encounter (HOSPITAL_COMMUNITY)
Admission: RE | Admit: 2024-02-28 | Discharge: 2024-02-28 | Disposition: A | Discharge status: Home / Self Care | Payer: 59 | Source: Ambulatory Visit | Attending: Internal Medicine

## 2024-03-04 ENCOUNTER — Other Ambulatory Visit (HOSPITAL_COMMUNITY): Payer: Self-pay

## 2024-03-04 ENCOUNTER — Ambulatory Visit (INDEPENDENT_AMBULATORY_CARE_PROVIDER_SITE_OTHER): Admitting: Licensed Clinical Social Worker

## 2024-03-04 DIAGNOSIS — F419 Anxiety disorder, unspecified: Secondary | ICD-10-CM | POA: Diagnosis not present

## 2024-03-04 NOTE — Progress Notes (Signed)
  Jason Floyd, [facility] Behavioral Health Counselor/Therapist Progress Note  Patient ID: Jason Floyd, MRN: [id number]    Date: 03/04/24  Time Spent: 11:07  am - 12:07 pm : 60 Minutes  Treatment Type: Individual Therapy.  Reported Symptoms: tired  Mental Status Exam: Appearance:  Well Groomed     Behavior: Appropriate and Sharing  Motor: Normal  Speech/Language:  Normal Rate  Affect: Appropriate  Mood: normal  Thought process: normal  Thought content:   WNL  Sensory/Perceptual disturbances:   WNL  Orientation: oriented to person, place, and time/date  Attention: Good  Concentration: Good  Memory: WNL  Fund of knowledge:  Good  Insight:   Good  Judgment:  Good  Impulse Control: Good   Risk Assessment: Danger to Self:  No Self-injurious Behavior: No Danger to Others: No Duty to Warn:no Physical Aggression / Violence:No  Access to Firearms a concern: No  Gang Involvement:No   Subjective:   Jason Floyd participated in the session, in person in the office with the therapist, and consented to treatment. Jason Floyd reviewed the events of the past week. Dog weekend event went well but tired.  Managing time and delegating action item.       Interventions: Mindfulness Meditation  Diagnosis:   Anxiety  Psychiatric Treatment: No , N/A  Treatment Plan:  Client Abilities/Strengths Jason Floyd is open and committed to sessions.  Support System: Family and Friends  Client Treatment Preferences In person  Client Statement of Needs Jason Floyd would like to get the new house renovations and organization action items so they can move.   Treatment Level Weekly  Symptoms  Excited   (Status: improved) Tired   (Status: declined)  Goals:   Jason Floyd like to be able to have control over his time.  Has feedback from partner to consider in how he is performing in work and maintaining health.  Organization team member are coming next week to start the  process of the project.  Also working on getting movers out to move three rooms and start the process.    Target Date: 03/25/24 Frequency: Weekly  Progress: 0 Modality: individual    Therapist will provide referrals for additional resources as appropriate.  Therapist will provide psycho-education regarding Jason Floyd's diagnosis and corresponding treatment approaches and interventions. Licensed Clinical Mental Health Counselor, Jason Floyd, [facility] will support the patient's ability to achieve the goals identified. will employ CBT, BA, Problem-solving, Solution Focused, Mindfulness,  coping skills, & other evidenced-based practices will be used to promote progress towards healthy functioning to help manage decrease symptoms associated with his diagnosis.   Reduce overall level, frequency, and intensity of the feelings of depression, anxiety and panic evidenced by decreased anxiety and increased ability to self regulate, delegate and carve  Verbally express understanding of the relationship between feelings of depression, anxiety and their impact on thinking patterns and behaviors. Verbalize an understanding of the role that distorted thinking plays in creating fears, excessive worry, and ruminations.  Jason Floyd participated in the creation of the treatment plan)      Jason Floyd, LCMHC

## 2024-03-09 ENCOUNTER — Other Ambulatory Visit (HOSPITAL_COMMUNITY): Payer: Self-pay | Admitting: Internal Medicine

## 2024-03-09 ENCOUNTER — Other Ambulatory Visit: Payer: Self-pay | Admitting: Family Medicine

## 2024-03-09 DIAGNOSIS — I5042 Chronic combined systolic (congestive) and diastolic (congestive) heart failure: Secondary | ICD-10-CM

## 2024-03-13 ENCOUNTER — Ambulatory Visit (HOSPITAL_COMMUNITY)
Admission: RE | Admit: 2024-03-13 | Discharge: 2024-03-13 | Disposition: A | Discharge status: Home / Self Care | Source: Ambulatory Visit | Attending: Internal Medicine | Admitting: Internal Medicine

## 2024-03-13 ENCOUNTER — Encounter (HOSPITAL_COMMUNITY): Payer: Self-pay | Admitting: Internal Medicine

## 2024-03-13 VITALS — BP 90/58 | HR 69

## 2024-03-13 DIAGNOSIS — E669 Obesity, unspecified: Secondary | ICD-10-CM | POA: Diagnosis not present

## 2024-03-13 DIAGNOSIS — Z95810 Presence of automatic (implantable) cardiac defibrillator: Secondary | ICD-10-CM | POA: Insufficient documentation

## 2024-03-13 DIAGNOSIS — Z79899 Other long term (current) drug therapy: Secondary | ICD-10-CM | POA: Diagnosis not present

## 2024-03-13 DIAGNOSIS — I2510 Atherosclerotic heart disease of native coronary artery without angina pectoris: Secondary | ICD-10-CM | POA: Diagnosis not present

## 2024-03-13 DIAGNOSIS — Z7982 Long term (current) use of aspirin: Secondary | ICD-10-CM | POA: Insufficient documentation

## 2024-03-13 DIAGNOSIS — I5022 Chronic systolic (congestive) heart failure: Secondary | ICD-10-CM | POA: Diagnosis present

## 2024-03-13 DIAGNOSIS — Z7985 Long-term (current) use of injectable non-insulin antidiabetic drugs: Secondary | ICD-10-CM | POA: Diagnosis not present

## 2024-03-13 DIAGNOSIS — I4819 Other persistent atrial fibrillation: Secondary | ICD-10-CM | POA: Insufficient documentation

## 2024-03-13 DIAGNOSIS — I252 Old myocardial infarction: Secondary | ICD-10-CM | POA: Insufficient documentation

## 2024-03-13 DIAGNOSIS — I110 Hypertensive heart disease with heart failure: Secondary | ICD-10-CM | POA: Insufficient documentation

## 2024-03-13 DIAGNOSIS — I472 Ventricular tachycardia, unspecified: Secondary | ICD-10-CM | POA: Insufficient documentation

## 2024-03-13 DIAGNOSIS — E039 Hypothyroidism, unspecified: Secondary | ICD-10-CM | POA: Diagnosis not present

## 2024-03-13 DIAGNOSIS — Z7901 Long term (current) use of anticoagulants: Secondary | ICD-10-CM | POA: Insufficient documentation

## 2024-03-13 DIAGNOSIS — I428 Other cardiomyopathies: Secondary | ICD-10-CM | POA: Diagnosis not present

## 2024-03-13 DIAGNOSIS — Z7984 Long term (current) use of oral hypoglycemic drugs: Secondary | ICD-10-CM | POA: Insufficient documentation

## 2024-03-13 DIAGNOSIS — R7989 Other specified abnormal findings of blood chemistry: Secondary | ICD-10-CM | POA: Diagnosis not present

## 2024-03-13 DIAGNOSIS — E119 Type 2 diabetes mellitus without complications: Secondary | ICD-10-CM | POA: Insufficient documentation

## 2024-03-13 DIAGNOSIS — Z87891 Personal history of nicotine dependence: Secondary | ICD-10-CM | POA: Insufficient documentation

## 2024-03-13 LAB — TSH: TSH: 7.175 u[IU]/mL — ABNORMAL HIGH (ref 0.350–4.500)

## 2024-03-13 LAB — CBC
HCT: 47.7 % (ref 39.0–52.0)
Hemoglobin: 16.3 g/dL (ref 13.0–17.0)
MCH: 32.3 pg (ref 26.0–34.0)
MCHC: 34.2 g/dL (ref 30.0–36.0)
MCV: 94.6 fL (ref 80.0–100.0)
Platelets: 291 10*3/uL (ref 150–400)
RBC: 5.04 MIL/uL (ref 4.22–5.81)
RDW: 13.1 % (ref 11.5–15.5)
WBC: 9.1 10*3/uL (ref 4.0–10.5)
nRBC: 0 % (ref 0.0–0.2)

## 2024-03-13 LAB — COMPREHENSIVE METABOLIC PANEL WITH GFR
ALT: 54 U/L — ABNORMAL HIGH (ref 0–44)
AST: 30 U/L (ref 15–41)
Albumin: 4.6 g/dL (ref 3.5–5.0)
Alkaline Phosphatase: 27 U/L — ABNORMAL LOW (ref 38–126)
Anion gap: 11 (ref 5–15)
BUN: 25 mg/dL — ABNORMAL HIGH (ref 6–20)
CO2: 27 mmol/L (ref 22–32)
Calcium: 10 mg/dL (ref 8.9–10.3)
Chloride: 99 mmol/L (ref 98–111)
Creatinine, Ser: 1.57 mg/dL — ABNORMAL HIGH (ref 0.61–1.24)
GFR, Estimated: 50 mL/min — ABNORMAL LOW (ref >60)
Glucose, Bld: 108 mg/dL — ABNORMAL HIGH (ref 70–99)
Potassium: 4.7 mmol/L (ref 3.5–5.1)
Sodium: 137 mmol/L (ref 135–145)
Total Bilirubin: 0.8 mg/dL (ref 0.0–1.2)
Total Protein: 7.2 g/dL (ref 6.5–8.1)

## 2024-03-13 LAB — T4, FREE: Free T4: 1.07 ng/dL (ref 0.61–1.12)

## 2024-03-13 LAB — BRAIN NATRIURETIC PEPTIDE: B Natriuretic Peptide: 47.6 pg/mL (ref 0.0–100.0)

## 2024-03-13 MED ORDER — AMIODARONE HCL 200 MG PO TABS: 100.0000 mg | ORAL_TABLET | Freq: Every day | ORAL | Status: DC

## 2024-03-13 MED ORDER — MEXILETINE HCL 150 MG PO CAPS: 300.0000 mg | ORAL_CAPSULE | Freq: Two times a day (BID) | ORAL | 3 refills | Status: DC

## 2024-03-13 NOTE — Patient Instructions (Signed)
 Great to see you today!!!  Medication Changes:  DECREASE Amiodarone  to 100 mg (1/2 tab) Daily  INCREASE Mexiletine to 300 mg (take 2 of the 150 mg capsules) Twice daily   Lab Work:  Labs done today, your results will be available in MyChart, we will contact you for abnormal readings.  Special Instructions // Education:  Do the following things EVERYDAY: Weigh yourself in the morning before breakfast. Write it down and keep it in a log. Take your medicines as prescribed Eat low salt foods--Limit salt (sodium) to 2000 mg per day.  Stay as active as you can everyday Limit all fluids for the day to less than 2 liters   Follow-Up in: 2 months at our [facility] location   At the [facility], you and your health needs are our priority. We have a designated team specialized in the treatment of Heart Failure. This Care Team includes your primary Heart Failure Specialized Cardiologist (physician), Advanced Practice Providers (APPs- Physician Assistants and Nurse Practitioners), and Pharmacist who all work together to provide you with the care you need, when you need it.   You may see any of the following providers on your designated Care Team at your next follow up:  Dr. Jules Oar Dr. Peder Bourdon Dr. Alwin Baars Dr. Judyth Nunnery Nieves Bars, NP Ruddy Corral, [place] [facility] [place], [place] Dennise Fitz, NP [place] Lee, NP Luster Salters, PharmD   Please be sure to bring in all your medications bottles to every appointment.   Need to Contact Us :  If you have any questions or concerns before your next appointment please send us  a message through [place] or call our office at [phone].    TO LEAVE A MESSAGE FOR THE NURSE SELECT OPTION 2, PLEASE LEAVE A MESSAGE INCLUDING: YOUR NAME DATE OF BIRTH CALL BACK NUMBER REASON FOR CALL**this is important as we prioritize the call backs  YOU WILL RECEIVE A CALL BACK THE SAME DAY AS LONG AS YOU CALL  BEFORE 4:00 PM

## 2024-03-13 NOTE — Progress Notes (Signed)
 ADVANCED HF CLINIC NOTE PCP: Jess Morita, MD EP: Dr. Rodolfo Clan HF: Dr. Julane Ny  CC: HF follow up   HPI: Jason Floyd is a 57 y.o. with a PMH of severe systolic heart failure due to NICM, DM2, former polysubstance and tobacco abuse.    Echo 2008 EF 15% in setting of polysubstance abuse.   Echo 12/2007 EF 55-60%  Echo 10/20 EF 55-60% no RWMA.   On 06/07/20 had TIA-like symptoms with mild aphasia and weakness (dropped a can with left hand). Went to PCP had carotid u/s and echo. Carotid u/s 06/22/20 1-39% bilaterally. Echo 8/21 EF down to 25-30%. Brain MRI/MRA. Normal vasculature.  Left corona radiata diffusion-weighted/FLAIR hyperintensity is nonspecific. Differential includes active demyelinating lesionversus subacute insult.  Underwent R/L cath 07/01/20 LAD 100% prox LCX 20% RCA 10%  EF 25-30%  After cath underwent cMRI for viability  - LVEF 26% - LGE suggestive of a very large, wrap-around LAD infarction with extensive no-reflow affecting the subendocardium.   Zio monitored placed for palpitations in 10/21 and found to have VT. Underwent ICD placement by Dr. Rodolfo Clan on 08/22/20. Was also found to have volume overload and AF.  Echo 05/29/23 EF 35-40%   Admitted 07/16/23 with ICD shocks found to have AF that degenerated to VT. Started amio. Underwent TEE/DC-CV for AF. Post DC-CV complicated by transient hypotension. TEE EF 25%  Echo 10/24 EF 30-35% (probably closer to 35%).   S/p AF ablation 11/24.  Today he returns for HF follow up. Just completed CR. Enjoyed CR. SBP ran in 90s while there but asymptomatic.Remains active walking on TM at 2.8 mph + 2% incline. No CP or SOB. No dizziness No ICD firings. We discussed possible VT ablation.    Past Medical History:  Diagnosis Date   AICD (automatic cardioverter/defibrillator) present    Allergy    Arthritis    Atrial fibrillation (HCC)    CHF (congestive heart failure) (HCC)    CHF (congestive heart failure) (HCC)     Diabetes mellitus without complication (HCC)    Drug abuse (HCC)    Eczema    GERD (gastroesophageal reflux disease)    Gout    Heart attack (HCC) 06/10/2020   History of kidney stones    Hyperlipidemia    Hypertension    MI (myocardial infarction) (HCC)    PE (pulmonary embolism)    TIA (transient ischemic attack)    hx of per pt - pt not aware he had not followed by a neurologist   Ventricular tachycardia (HCC)    Current Outpatient Medications  Medication Sig Dispense Refill   allopurinol  (ZYLOPRIM ) 300 MG tablet TAKE 2 TABLETS (600 MG TOTAL) BY MOUTH DAILY. 60 tablet 6   aspirin  EC 81 MG tablet Take 81 mg by mouth daily. Swallow whole.     atorvastatin  (LIPITOR) 80 MG tablet TAKE 1 TABLET BY MOUTH DAILY. 30 tablet 11   calcium  carbonate (TUMS - DOSED IN MG ELEMENTAL CALCIUM ) 500 MG chewable tablet Chew 2-3 tablets by mouth daily as needed for indigestion or heartburn.     carvedilol  (COREG ) 3.125 MG tablet TAKE 1 TABLET BY MOUTH 2 TIMES DAILY. 180 tablet 3   cetirizine (ZYRTEC) 10 MG tablet Take 10 mg by mouth daily.     cyclobenzaprine  (FLEXERIL ) 10 MG tablet TAKE 1 TABLET BY MOUTH 3 TIMES DAILY AS NEEDED FOR MUSCLE SPASMS. 45 tablet 2   ELIQUIS  5 MG TABS tablet TAKE 1 TABLET BY MOUTH 2 TIMES A DAY 60  tablet 11   Evolocumab  (REPATHA  SURECLICK) 140 MG/ML SOAJ INJECT 140 MG INTO THE SKIN EVERY 14 DAYS. 6 mL 0   FARXIGA  10 MG TABS tablet TAKE 1 TABLET BY MOUTH DAILY BEFORE BREAKFAST. 90 tablet 2   fenofibrate  160 MG tablet TAKE 1 TABLET BY MOUTH DAILY. 90 tablet 1   furosemide  (LASIX ) 20 MG tablet Take 1 tablet by mouth daily as needed for swelling or a weight gain of 3 pounds or more in 24 hours or 5 pounds in one week. 30 tablet 5   magnesium  oxide (MAG-OX) 400 MG tablet Take 1 tab by mouth Twice daily 60 tablet 12   methocarbamol  (ROBAXIN ) 500 MG tablet TAKE 1 TABLET BY MOUTH EVERY 6 HOURS AS NEEDED FOR MUSCLE SPASMS. 90 tablet 0   Naphazoline HCl (CLEAR EYES OP) Place 1 drop into  both eyes daily as needed (sore eyes).     pantoprazole  (PROTONIX ) 40 MG tablet TAKE 1 TABLET BY MOUTH DAILY. 30 tablet 3   potassium chloride  SA (KLOR-CON  M20) 20 MEQ tablet Take 1 tablet by mouth when taking Lasix  30 tablet 5   sacubitril -valsartan  (ENTRESTO ) 49-51 MG Take 1 tablet by mouth 2 (two) times daily. 60 tablet 3   Semaglutide , 2 MG/DOSE, (OZEMPIC , 2 MG/DOSE,) 8 MG/3ML SOPN Inject 2 mg into the skin once a week. (Patient taking differently: Inject 2 mg into the skin once a week. Wednesdays) 3 mL 11   sodium chloride  (OCEAN) 0.65 % SOLN nasal spray Place 1 spray into both nostrils as needed (dryness).     spironolactone  (ALDACTONE ) 25 MG tablet TAKE 1 TABLET (25 MG TOTAL) BY MOUTH AT BEDTIME. 30 tablet 7   traZODone  (DESYREL ) 100 MG tablet TAKE 1 TABLET (100 MG TOTAL) BY MOUTH AT BEDTIME. 90 tablet 1   triamcinolone  (KENALOG ) 0.025 % ointment Apply 1 Application topically daily as needed (Dry skin).     VASCEPA  1 g capsule TAKE 2 CAPSULES BY MOUTH 2 TIMES DAILY. 120 capsule 4   amiodarone  (PACERONE ) 200 MG tablet Take 0.5 tablets (100 mg total) by mouth daily.     gabapentin (NEURONTIN) 100 MG capsule Take 100 mg by mouth 3 (three) times daily. (Patient not taking: Reported on 02/26/2024)     mexiletine (MEXITIL ) 150 MG capsule Take 2 capsules (300 mg total) by mouth 2 (two) times daily. 120 capsule 3   No current facility-administered medications for this encounter.   No Known Allergies   Social History   Socioeconomic History   Marital status: Married    Spouse name: Concha Deed   Number of children: Not on file   Years of education: Not on file   Highest education level: Bachelor's degree (e.g., BA, AB, BS)  Occupational History   Not on file  Tobacco Use   Smoking status: Former    Current packs/day: 0.00    Average packs/day: 0.3 packs/day for 31.0 years (7.8 ttl pk-yrs)    Types: Cigarettes    Quit date: 07/15/2023    Years since quitting: 0.6   Smokeless tobacco: Never    Tobacco comments:    Former smoker 11/01/23  Vaping Use   Vaping status: Never Used  Substance and Sexual Activity   Alcohol use: Yes    Comment: once a month   Drug use: Not Currently    Types: Marijuana    Comment: gummies every days   Sexual activity: Not on file  Other Topics Concern   Not on file  Social History Narrative  Lives with wife   Caffeine- coffee 4 c daily   Social Drivers of Health   Financial Resource Strain: Low Risk  (12/17/2023)   Overall Financial Resource Strain (CARDIA)    Difficulty of Paying Living Expenses: Not hard at all  Food Insecurity: No Food Insecurity (12/17/2023)   Hunger Vital Sign    Worried About Running Out of Food in the Last Year: Never true    Ran Out of Food in the Last Year: Never true  Transportation Needs: No Transportation Needs (12/17/2023)   PRAPARE - Administrator, Civil Service (Medical): No    Lack of Transportation (Non-Medical): No  Physical Activity: Insufficiently Active (12/17/2023)   Exercise Vital Sign    Days of Exercise per Week: 3 days    Minutes of Exercise per Session: 30 min  Stress: No Stress Concern Present (12/17/2023)   [employer] of Occupational Health - Occupational Stress Questionnaire    Feeling of Stress : Only a little  Social Connections: Moderately Integrated (12/17/2023)   Social Connection and Isolation Panel [NHANES]    Frequency of Communication with Friends and Family: More than three times a week    Frequency of Social Gatherings with Friends and Family: Once a week    Attends Religious Services: Never    Database administrator or Organizations: Yes    Attends Engineer, structural: More than 4 times per year    Marital Status: Married  Catering manager Violence: Not At Risk (07/17/2023)   Humiliation, Afraid, Rape, and Kick questionnaire    Fear of Current or Ex-Partner: No    Emotionally Abused: No    Physically Abused: No    Sexually Abused: No    Family History   Problem Relation Age of Onset   Dementia Mother        frontal-temporal   Stroke Mother    Breast cancer Maternal Aunt    Other Brother        overdose   Colon cancer Neg Hx    Wt Readings from Last 3 Encounters:  02/21/24 103.9 kg (229 lb 0.9 oz)  01/10/24 106.6 kg (235 lb)  12/18/23 107.2 kg (236 lb 4 oz)   BP (!) 90/58   Pulse 69   SpO2 98%   PHYSICAL EXAM: General:  Well appearing. No resp difficulty HEENT: normal Neck: supple. no JVD. Carotids 2+ bilat; no bruits. No lymphadenopathy or thryomegaly appreciated. Cor: PMI nondisplaced. Regular rate & rhythm. No rubs, gallops or murmurs. Lungs: clear Abdomen: soft, nontender, nondistended. No hepatosplenomegaly. No bruits or masses. Good bowel sounds. Extremities: no cyanosis, clubbing, rash, edema Neuro: alert & orientedx3, cranial nerves grossly intact. moves all 4 extremities w/o difficulty. Affect pleasant  ICD interrogated (personally reviewed): HL Score 0 Volume ok No VT/AF Activity level 1.3hr/day  ECG (personally reviewed): SB 68 1AVB ( )   ASSESSMENT & PLAN:  Chronic Systolic HF due to iCM - Echo 6063 EF 15% in setting of polysubstance abuse.  - Echo 2/09 EF 55-60% - Echo 10/20 EF 55-60% no RWMA.  - Echo 8/21 EF 25-30% - Cath 8/21 LAD 100% LCx 20% RCA 10% - cMRI 9/21 EF 26% Large anterior infarct - ICD placed 10/21 for VT - Echo 12/21 EF 30-35% - Echo 6/22 EF 40-45%  - Echo 6/23 EF 40-45% (read as 35-40%)  - Echo 7/24 EF 35-40%  - TEE 9/24 EF 25% in setting of AF/VT - Echo 10/24 EF 30-35% (probably closer to 35%).  -  Doing well NYHA I-II  - Continue carvedilol  3.125 mg bid - Continue Entresto  49/51 bid. - Continue Farxiga  10 mg daily. - Continue spiro 25 mg daily. - BP too low to titrate GDMT further. If SBP drops into 80s or becomes symptomatic can drop Entresto  to 24/26 bid - Labs today  2. CAD - cath 8/21 subacute LAD infarct and CTO LAD - No s/s angina - Continue ASA, statin.  - Followed  by Lipid clinic - LDL 2/25 was 28  3. VT - s/p ICD 10/21 - s/p ICD shock in 9/24 - Felt to be related to LAD scar  - Trying to wean amio again - Will increase mexilitene to 300 bid and decrease amio to 100 daily.  - Also considering possible VT ablation but would like to manage medically if possible. I discussed personally wit Dr. Daneil Dunker - Keep K. 4.0 Mg > 2.0  4. Persistent AF - s/p AF ablation 11/24. - Remains in NSR today and on ICD. Meds as above - Continue Eliquis  5 mg bid. No bleeding.  5. Tobacco use - Has been quit since 9/204. - No change  6. DM2 - Continue SGLT2i, metformin , GLP1-RA  7. Hx Possible TIA - Carotid dopplers ok - Brain MRI/MRA. Vasculature ok. Findings suspicious for cardio-embolism.   8. Hypothyroidism - Continue synthroid  - Recheck TFTs on amio  9. Obesity - There is no height or weight on file to calculate BMI. - Continue GLP1-RA   Dartanion Teo, MD  8:08 PM

## 2024-03-15 LAB — T3, FREE: T3, Free: 2.5 pg/mL (ref 2.0–4.4)

## 2024-03-18 ENCOUNTER — Ambulatory Visit (INDEPENDENT_AMBULATORY_CARE_PROVIDER_SITE_OTHER): Admitting: Licensed Clinical Social Worker

## 2024-03-18 DIAGNOSIS — F419 Anxiety disorder, unspecified: Secondary | ICD-10-CM

## 2024-03-18 NOTE — Progress Notes (Signed)
 [facility] Counselor/Therapist Progress Note  Patient ID: Jason Floyd, MRN: [id number]    Date: 03/18/24  Time Spent: 11:05  am - 11:59 am : 54 Minutes  Treatment Type: Individual Therapy.  Reported Symptoms: feeling more productive and positive about tackling responsibilities, slowing down and being more mindfully connected  Mental Status Exam: Appearance:  Casual     Behavior: Sharing  Motor: Normal  Speech/Language:  Normal Rate  Affect: Appropriate  Mood: normal  Thought process: normal  Thought content:   WNL  Sensory/Perceptual disturbances:   WNL  Orientation: oriented to person, place, and time/date  Attention: Good  Concentration: Good  Memory: WNL  Fund of knowledge:  Good  Insight:   Good  Judgment:  Good  Impulse Control: Good   Risk Assessment: Danger to Self:  No Self-injurious Behavior: No Danger to Others: No Duty to Warn:no Physical Aggression / Violence:No  Access to Firearms a concern: No  Gang Involvement:No   Subjective:   Jason Floyd participated in the session, in person in the office with the therapist, and consented to treatment. Jason Floyd reviewed the events of the past week.      Interventions: Mindfulness Meditation, Solution-Oriented/Positive Psychology, and Insight-Oriented  Diagnosis:   Anxiety  Psychiatric Treatment: No , N/A  Treatment Plan: N/A Client Abilities/Strengths Jason Floyd is open and committed to sessions.    Support System: Family and Friends  Client Treatment Preferences In person  Client Statement of Needs Jason Floyd would like to continue to focus on increasing exercise and mindful eating in order to manage health.    Treatment Level Biweekly  Symptoms  Excited   (Status: improved) Hopeful   (Status: improved)  Goals:   Jason Floyd has made progress in the goal setting, following healthcare and seeing positive results, gained more control with outsourcing lawn care and  housekeeping, increasing exercise from 3x per week to 5x, mindful eating techniques.  Target Date: 03/25/24 Frequency: BiWeekly  Progress: 80 Modality: individual    Therapist will provide referrals for additional resources as appropriate.  Therapist will provide psycho-education regarding Jason Floyd diagnosis and corresponding treatment approaches and interventions. Licensed Clinical Mental Health Counselor, Jason Floyd, [facility] will support the patient's ability to achieve the goals identified. will employ CBT, BA, Problem-solving, Solution Focused, Mindfulness,  coping skills, & other evidenced-based practices will be used to promote progress towards healthy functioning to help manage decrease symptoms associated with his diagnosis.   Reduce overall level, frequency, and intensity of the feelings of depression, anxiety and panic evidenced by increased exercise, guided visualization, and organization skills to reduce anxiety and increase a sense of control. Verbally express understanding of the relationship between feelings of depression, anxiety and their impact on thinking patterns and behaviors. Verbalize an understanding of the role that distorted thinking plays in creating fears, excessive worry, and ruminations.  Jason Floyd participated in the creation of the treatment plan)   Jason Floyd, LCMHC

## 2024-03-19 ENCOUNTER — Encounter: Payer: Self-pay | Admitting: Family Medicine

## 2024-03-19 NOTE — Telephone Encounter (Signed)
 Patient asking about referral to Endo due to second elevated TSH level okay to send referral?

## 2024-04-01 ENCOUNTER — Ambulatory Visit: Admitting: Licensed Clinical Social Worker

## 2024-04-03 ENCOUNTER — Ambulatory Visit: Payer: Self-pay | Admitting: Emergency Medicine

## 2024-04-03 DIAGNOSIS — R918 Other nonspecific abnormal finding of lung field: Secondary | ICD-10-CM

## 2024-04-03 NOTE — Progress Notes (Signed)
 Remote ICD transmission.

## 2024-04-07 ENCOUNTER — Other Ambulatory Visit (HOSPITAL_COMMUNITY): Payer: Self-pay | Admitting: Internal Medicine

## 2024-04-07 DIAGNOSIS — I5042 Chronic combined systolic (congestive) and diastolic (congestive) heart failure: Secondary | ICD-10-CM

## 2024-04-07 NOTE — Progress Notes (Unsigned)
  Electrophysiology Office Note:   ID:  Jason Floyd, DOB Jun 09, 1966, MRN [id number]  Primary Cardiologist: None Electrophysiologist: Ardeen Kohler, MD  {Click to update primary MD,subspecialty MD or APP then REFRESH:1}    History of Present Illness:   Jason Floyd is a 58 y.o. male with h/o DM2, chronic back pain, NICM that had recovered, CAD w ICM s/p ICD, chronic CHF, HTN, HLD, PE and PAF seen today for routine electrophysiology followup.   Since last being seen in our clinic the patient reports doing ***.  he denies chest pain, palpitations, dyspnea, PND, orthopnea, nausea, vomiting, dizziness, syncope, edema, weight gain, or early satiety.   Review of systems complete and found to be negative unless listed in HPI.   EP Information / Studies Reviewed:    EKG is not ordered today. EKG from 03/13/2024 reviewed which showed NSR at 68 bpm with 1st degree AV block of 252 ms       ICD Interrogation-  reviewed in detail today,  See PACEART report.  Arrhythmia/Device History Field seismologist ICD implanted 08/2020 for VT  S/p PVI and posterior wall ablation 09/2023   Physical Exam:   VS:  There were no vitals taken for this visit.   Wt Readings from Last 3 Encounters:  02/21/24 229 lb 0.9 oz (103.9 kg)  01/10/24 235 lb (106.6 kg)  12/18/23 236 lb 4 oz (107.2 kg)     GEN: No acute distress *** NECK: No JVD; No carotid bruits CARDIAC: {EPRHYTHM:28826}, no murmurs, rubs, gallops RESPIRATORY:  Clear to auscultation without rales, wheezing or rhonchi  ABDOMEN: Soft, non-tender, non-distended EXTREMITIES:  {EDEMA LEVEL:28147::"No"} edema; No deformity   ASSESSMENT AND PLAN:    Ventricular arrhythmia  s/p [employer] dual chamber ICD  Chronic systolic CHF Echo 08/2023 LVEF 30-35% euvolemic today Stable on an appropriate medical regimen Normal ICD function See Pace Art report Continue amiodarone  200 mg daily Continue mexitil  200 mg BID Have  offered to refer to tertiary center for ablation consideration.   Persistent AF Maintaining NSR s/p ablation 09/2023  High risk medication monitoring - Amiodarone  Patient requires ongoing monitoring for anti-arrhythmic medication   CAD No s/s of ischemia.      Disposition:   Follow up with {EPPROVIDERS:28135} {EPFOLLOW UP:28173}   Signed, Tylene Galla, PA-C

## 2024-04-08 ENCOUNTER — Encounter: Payer: Self-pay | Admitting: Student

## 2024-04-08 ENCOUNTER — Ambulatory Visit: Payer: 59 | Attending: Student | Admitting: Student

## 2024-04-08 VITALS — BP 110/66 | HR 71 | Ht 74.0 in | Wt 220.0 lb

## 2024-04-08 DIAGNOSIS — I5022 Chronic systolic (congestive) heart failure: Secondary | ICD-10-CM

## 2024-04-08 DIAGNOSIS — I472 Ventricular tachycardia, unspecified: Secondary | ICD-10-CM | POA: Diagnosis not present

## 2024-04-08 DIAGNOSIS — I2510 Atherosclerotic heart disease of native coronary artery without angina pectoris: Secondary | ICD-10-CM

## 2024-04-08 DIAGNOSIS — Z95810 Presence of automatic (implantable) cardiac defibrillator: Secondary | ICD-10-CM | POA: Diagnosis not present

## 2024-04-08 DIAGNOSIS — I4819 Other persistent atrial fibrillation: Secondary | ICD-10-CM

## 2024-04-08 NOTE — Patient Instructions (Signed)
 Medication Instructions:  No medication changes today. *If you need a refill on your cardiac medications before your next appointment, please call your pharmacy*  Lab Work: No labwork ordered today. If you have labs (blood work) drawn today and your tests are completely normal, you will receive your results only by: MyChart Message (if you have MyChart) OR A paper copy in the mail If you have any lab test that is abnormal or we need to change your treatment, we will call you to review the results.  Testing/Procedures: No testing ordered today  Follow-Up: At [facility], you and your health needs are our priority.  As part of our continuing mission to provide you with exceptional heart care, our providers are all part of one team.  This team includes your primary Cardiologist (physician) and Advanced Practice Providers or APPs (Physician Assistants and Nurse Practitioners) who all work together to provide you with the care you need, when you need it.  Your next appointment:   6 month(s)  Provider:   You may see Ardeen Kohler, MD or one of the following Advanced Practice Providers on your designated Care Team:   Mertha Abrahams, Kennard Pea "Jonelle Neri" [place], PA-C Suzann Riddle, NP Creighton Doffing, NP    We recommend signing up for the patient portal called "MyChart".  Sign up information is provided on this After Visit Summary.  MyChart is used to connect with patients for Virtual Visits (Telemedicine).  Patients are able to view lab/test results, encounter notes, upcoming appointments, etc.  Non-urgent messages can be sent to your provider as well.   To learn more about what you can do with MyChart, go to [email or web address].

## 2024-04-10 ENCOUNTER — Encounter: Payer: Self-pay | Admitting: Internal Medicine

## 2024-04-14 ENCOUNTER — Other Ambulatory Visit

## 2024-04-15 ENCOUNTER — Ambulatory Visit: Admitting: Licensed Clinical Social Worker

## 2024-04-22 ENCOUNTER — Other Ambulatory Visit: Payer: Self-pay | Admitting: Family Medicine

## 2024-04-22 ENCOUNTER — Encounter: Payer: Self-pay | Admitting: Family Medicine

## 2024-04-23 MED ORDER — METHOCARBAMOL 500 MG PO TABS: 500.0000 mg | ORAL_TABLET | Freq: Four times a day (QID) | ORAL | 0 refills | Status: DC | PRN

## 2024-04-23 MED ORDER — CYCLOBENZAPRINE HCL 10 MG PO TABS: 10.0000 mg | ORAL_TABLET | Freq: Three times a day (TID) | ORAL | 2 refills | Status: DC | PRN

## 2024-04-29 ENCOUNTER — Ambulatory Visit: Admitting: Licensed Clinical Social Worker

## 2024-04-29 ENCOUNTER — Other Ambulatory Visit (HOSPITAL_COMMUNITY): Payer: Self-pay | Admitting: Internal Medicine

## 2024-04-29 DIAGNOSIS — G459 Transient cerebral ischemic attack, unspecified: Secondary | ICD-10-CM

## 2024-04-29 DIAGNOSIS — I2510 Atherosclerotic heart disease of native coronary artery without angina pectoris: Secondary | ICD-10-CM

## 2024-05-06 ENCOUNTER — Telehealth: Payer: Self-pay | Admitting: Internal Medicine

## 2024-05-06 NOTE — Telephone Encounter (Signed)
 Called to confirm/remind patient of their appointment at the [facility] on 05/07/24.   Appointment:   [] Confirmed  [x] Left mess   [] No answer/No voice mail  [] VM Full/unable to leave message  [] Phone not in service  Patient reminded to bring all medications and/or complete list.  Confirmed patient has transportation. Gave directions, instructed to utilize valet parking.

## 2024-05-07 ENCOUNTER — Ambulatory Visit: Attending: Internal Medicine | Admitting: Internal Medicine

## 2024-05-07 VITALS — BP 106/57 | HR 77 | Wt 219.0 lb

## 2024-05-07 DIAGNOSIS — Z87891 Personal history of nicotine dependence: Secondary | ICD-10-CM | POA: Insufficient documentation

## 2024-05-07 DIAGNOSIS — E669 Obesity, unspecified: Secondary | ICD-10-CM | POA: Diagnosis not present

## 2024-05-07 DIAGNOSIS — I110 Hypertensive heart disease with heart failure: Secondary | ICD-10-CM | POA: Diagnosis present

## 2024-05-07 DIAGNOSIS — Z7985 Long-term (current) use of injectable non-insulin antidiabetic drugs: Secondary | ICD-10-CM | POA: Diagnosis not present

## 2024-05-07 DIAGNOSIS — I4819 Other persistent atrial fibrillation: Secondary | ICD-10-CM | POA: Diagnosis not present

## 2024-05-07 DIAGNOSIS — Z95810 Presence of automatic (implantable) cardiac defibrillator: Secondary | ICD-10-CM

## 2024-05-07 DIAGNOSIS — I2510 Atherosclerotic heart disease of native coronary artery without angina pectoris: Secondary | ICD-10-CM | POA: Diagnosis not present

## 2024-05-07 DIAGNOSIS — Z7982 Long term (current) use of aspirin: Secondary | ICD-10-CM | POA: Diagnosis not present

## 2024-05-07 DIAGNOSIS — E039 Hypothyroidism, unspecified: Secondary | ICD-10-CM | POA: Insufficient documentation

## 2024-05-07 DIAGNOSIS — I472 Ventricular tachycardia, unspecified: Secondary | ICD-10-CM

## 2024-05-07 DIAGNOSIS — I252 Old myocardial infarction: Secondary | ICD-10-CM | POA: Insufficient documentation

## 2024-05-07 DIAGNOSIS — E119 Type 2 diabetes mellitus without complications: Secondary | ICD-10-CM | POA: Diagnosis not present

## 2024-05-07 DIAGNOSIS — Z79899 Other long term (current) drug therapy: Secondary | ICD-10-CM | POA: Insufficient documentation

## 2024-05-07 DIAGNOSIS — Z4502 Encounter for adjustment and management of automatic implantable cardiac defibrillator: Secondary | ICD-10-CM | POA: Diagnosis not present

## 2024-05-07 DIAGNOSIS — Z6828 Body mass index (BMI) 28.0-28.9, adult: Secondary | ICD-10-CM | POA: Insufficient documentation

## 2024-05-07 DIAGNOSIS — Z7984 Long term (current) use of oral hypoglycemic drugs: Secondary | ICD-10-CM | POA: Diagnosis not present

## 2024-05-07 DIAGNOSIS — F1910 Other psychoactive substance abuse, uncomplicated: Secondary | ICD-10-CM | POA: Diagnosis not present

## 2024-05-07 DIAGNOSIS — I5022 Chronic systolic (congestive) heart failure: Secondary | ICD-10-CM | POA: Diagnosis present

## 2024-05-07 DIAGNOSIS — Z7901 Long term (current) use of anticoagulants: Secondary | ICD-10-CM | POA: Diagnosis not present

## 2024-05-07 NOTE — Patient Instructions (Signed)
  Testing/Procedures:  Please have your echo completed. You will check in for this at the [facility]. You have to arrive 15 MINS EARLY for preparation, otherwise you will have to reschedule.   Follow-Up in: 3 months with an echocardiogram   At the [facility], you and your health needs are our priority. We have a designated team specialized in the treatment of Heart Failure. This Care Team includes your primary Heart Failure Specialized Cardiologist (physician), Advanced Practice Providers (APPs- Physician Assistants and Nurse Practitioners), and Pharmacist who all work together to provide you with the care you need, when you need it.   You may see any of the following providers on your designated Care Team at your next follow up:  Dr. Toribio Fuel Dr. Ezra Shuck Dr. Ria Commander Dr. Odis Brownie Ellouise Class, FNP Jaun Bash, RPH-CPP  Please be sure to bring in all your medications bottles to every appointment.   Need to Contact Us :  If you have any questions or concerns before your next appointment please send us  a message through [place] or call our office at [phone].    TO LEAVE A MESSAGE FOR THE NURSE SELECT OPTION 2, PLEASE LEAVE A MESSAGE INCLUDING: YOUR NAME DATE OF BIRTH CALL BACK NUMBER REASON FOR CALL**this is important as we prioritize the call backs  YOU WILL RECEIVE A CALL BACK THE SAME DAY AS LONG AS YOU CALL BEFORE 4:00 PM

## 2024-05-07 NOTE — Progress Notes (Signed)
 [facility] NOTE PCP: Mahlon Comer BRAVO, MD EP: Dr. Fernande HF: Dr. Cherrie  CC: HF follow up   HPI: [name] is a 58 y.o. with a PMH of severe systolic heart failure due to NICM, DM2, former polysubstance and tobacco abuse.    Echo 2008 EF 15% in setting of polysubstance abuse.   Echo 12/2007 EF 55-60%  Echo 10/20 EF 55-60% no RWMA.   On 06/07/20 had TIA-like symptoms with mild aphasia and weakness (dropped a can with left hand). Went to PCP had carotid u/s and echo. Carotid u/s 06/22/20 1-39% bilaterally. Echo 8/21 EF down to 25-30%. Brain MRI/MRA. Normal vasculature.  Left corona radiata diffusion-weighted/FLAIR hyperintensity is nonspecific. Differential includes active demyelinating lesionversus subacute insult.  Underwent R/L cath 07/01/20 LAD 100% prox LCX 20% RCA 10%  EF 25-30%  After cath underwent cMRI for viability  - LVEF 26% - LGE suggestive of a very large, wrap-around LAD infarction with extensive no-reflow affecting the subendocardium.   Zio monitored placed for palpitations in 10/21 and found to have VT. Underwent ICD placement by Dr. Fernande on 08/22/20. Was also found to have volume overload and AF.  Echo 05/29/23 EF 35-40%   Admitted 07/16/23 with ICD shocks found to have AF that degenerated to VT. Started amio. Underwent TEE/DC-CV for AF. Post DC-CV complicated by transient hypotension. TEE EF 25%  Echo 10/24 EF 30-35% (probably closer to 35%).   S/p AF ablation 11/24.  Today he returns for HF follow up. Remains very active with his dogs. Just moved as well. Finished CR. Has been more careful with his diet. No CP or SOB. SBP runs right around 100. Has lost 10 pounds in 2 months. No edema, Almost 11 months without smoking.     Past Medical History:  Diagnosis Date   AICD (automatic cardioverter/defibrillator) present    Allergy    Arthritis    Atrial fibrillation (HCC)    CHF (congestive heart failure) (HCC)    CHF (congestive heart failure)  (HCC)    Diabetes mellitus without complication (HCC)    Drug abuse (HCC)    Eczema    GERD (gastroesophageal reflux disease)    Gout    Heart attack (HCC) 06/10/2020   History of kidney stones    Hyperlipidemia    Hypertension    MI (myocardial infarction) (HCC)    PE (pulmonary embolism)    TIA (transient ischemic attack)    hx of per pt - pt not aware he had not followed by a neurologist   Ventricular tachycardia (HCC)    Current Outpatient Medications  Medication Sig Dispense Refill   allopurinol  (ZYLOPRIM ) 300 MG tablet TAKE 2 TABLETS (600 MG TOTAL) BY MOUTH DAILY. 60 tablet 6   amiodarone  (PACERONE ) 200 MG tablet Take 0.5 tablets (100 mg total) by mouth daily.     aspirin  EC 81 MG tablet Take 81 mg by mouth daily. Swallow whole.     atorvastatin  (LIPITOR) 80 MG tablet TAKE 1 TABLET BY MOUTH DAILY. 30 tablet 11   calcium  carbonate (TUMS - DOSED IN MG ELEMENTAL CALCIUM ) 500 MG chewable tablet Chew 2-3 tablets by mouth daily as needed for indigestion or heartburn.     carvedilol  (COREG ) 3.125 MG tablet TAKE 1 TABLET BY MOUTH 2 TIMES DAILY. 180 tablet 3   cetirizine (ZYRTEC) 10 MG tablet Take 10 mg by mouth daily.     cyclobenzaprine  (FLEXERIL ) 10 MG tablet Take 1 tablet (10 mg total) by mouth 3 (  three) times daily as needed for muscle spasms. 45 tablet 2   ELIQUIS  5 MG TABS tablet TAKE 1 TABLET BY MOUTH 2 TIMES A DAY 60 tablet 11   ENTRESTO  49-51 MG TAKE 1 TABLET BY MOUTH 2 TIMES DAILY. 60 tablet 3   Evolocumab  (REPATHA  SURECLICK) 140 MG/ML SOAJ INJECT 140 MG INTO THE SKIN EVERY 14 DAYS. 6 mL 1   FARXIGA  10 MG TABS tablet TAKE 1 TABLET BY MOUTH DAILY BEFORE BREAKFAST. 90 tablet 2   fenofibrate  160 MG tablet TAKE 1 TABLET BY MOUTH DAILY. 90 tablet 1   furosemide  (LASIX ) 20 MG tablet Take 1 tablet by mouth daily as needed for swelling or a weight gain of 3 pounds or more in 24 hours or 5 pounds in one week. 30 tablet 5   magnesium  oxide (MAG-OX) 400 MG tablet Take 1 tab by mouth  Twice daily 60 tablet 12   methocarbamol  (ROBAXIN ) 500 MG tablet Take 1 tablet (500 mg total) by mouth every 6 (six) hours as needed for muscle spasms. 90 tablet 0   mexiletine (MEXITIL ) 150 MG capsule Take 2 capsules (300 mg total) by mouth 2 (two) times daily. 120 capsule 3   Naphazoline HCl (CLEAR EYES OP) Place 1 drop into both eyes daily as needed (sore eyes).     pantoprazole  (PROTONIX ) 40 MG tablet TAKE 1 TABLET BY MOUTH DAILY. 30 tablet 3   potassium chloride  SA (KLOR-CON  M20) 20 MEQ tablet Take 1 tablet by mouth when taking Lasix  30 tablet 5   Semaglutide , 2 MG/DOSE, (OZEMPIC , 2 MG/DOSE,) 8 MG/3ML SOPN Inject 2 mg into the skin once a week. 3 mL 11   sodium chloride  (OCEAN) 0.65 % SOLN nasal spray Place 1 spray into both nostrils as needed (dryness).     spironolactone  (ALDACTONE ) 25 MG tablet TAKE 1 TABLET (25 MG TOTAL) BY MOUTH AT BEDTIME. 30 tablet 7   traZODone  (DESYREL ) 100 MG tablet TAKE 1 TABLET (100 MG TOTAL) BY MOUTH AT BEDTIME. 90 tablet 1   triamcinolone  (KENALOG ) 0.025 % ointment Apply 1 Application topically daily as needed (Dry skin).     VASCEPA  1 g capsule TAKE 2 CAPSULES BY MOUTH 2 TIMES DAILY. 120 capsule 4   No current facility-administered medications for this visit.   No Known Allergies   Social History   Socioeconomic History   Marital status: Married    Spouse name: Jason Floyd   Number of children: Not on file   Years of education: Not on file   Highest education level: Bachelor's degree (e.g., BA, AB, BS)  Occupational History   Not on file  Tobacco Use   Smoking status: Former    Current packs/day: 0.00    Average packs/day: 0.3 packs/day for 31.0 years (7.8 ttl pk-yrs)    Types: Cigarettes    Quit date: 07/15/2023    Years since quitting: 0.8   Smokeless tobacco: Never   Tobacco comments:    Former smoker 11/01/23  Vaping Use   Vaping status: Never Used  Substance and Sexual Activity   Alcohol use: Yes    Comment: once a month   Drug use: Not  Currently    Types: Marijuana    Comment: gummies every days   Sexual activity: Not on file  Other Topics Concern   Not on file  Social History Narrative   Lives with wife   Caffeine- coffee 4 c daily   Social Drivers of Corporate investment banker Strain: Low Risk  (  12/17/2023)   Overall Financial Resource Strain (CARDIA)    Difficulty of Paying Living Expenses: Not hard at all  Food Insecurity: No Food Insecurity (12/17/2023)   Hunger Vital Sign    Worried About Running Out of Food in the Last Year: Never true    Ran Out of Food in the Last Year: Never true  Transportation Needs: No Transportation Needs (12/17/2023)   PRAPARE - Administrator, Civil Service (Medical): No    Lack of Transportation (Non-Medical): No  Physical Activity: Insufficiently Active (12/17/2023)   Exercise Vital Sign    Days of Exercise per Week: 3 days    Minutes of Exercise per Session: 30 min  Stress: No Stress Concern Present (12/17/2023)   [employer] of Occupational Health - Occupational Stress Questionnaire    Feeling of Stress : Only a little  Social Connections: Moderately Integrated (12/17/2023)   Social Connection and Isolation Panel    Frequency of Communication with Friends and Family: More than three times a week    Frequency of Social Gatherings with Friends and Family: Once a week    Attends Religious Services: Never    Database administrator or Organizations: Yes    Attends Engineer, structural: More than 4 times per year    Marital Status: Married  Catering manager Violence: Not At Risk (07/17/2023)   Humiliation, Afraid, Rape, and Kick questionnaire    Fear of Current or Ex-Partner: No    Emotionally Abused: No    Physically Abused: No    Sexually Abused: No    Family History  Problem Relation Age of Onset   Dementia Mother        frontal-temporal   Stroke Mother    Breast cancer Maternal Aunt    Other Brother        overdose   Colon cancer Neg Hx    Wt  Readings from Last 3 Encounters:  05/07/24 219 lb (99.3 kg)  04/08/24 220 lb (99.8 kg)  02/21/24 229 lb 0.9 oz (103.9 kg)   BP (!) 106/57   Pulse 77   Wt 219 lb (99.3 kg)   SpO2 99%   BMI 28.12 kg/m   PHYSICAL EXAM: General:  Well appearing. No resp difficulty HEENT: normal Neck: supple. no JVD. Carotids 2+ bilat; no bruits. No lymphadenopathy or thryomegaly appreciated. Cor: PMI nondisplaced. Regular rate & rhythm. No rubs, gallops or murmurs. Lungs: clear Abdomen: soft, nontender, nondistended. No hepatosplenomegaly. No bruits or masses. Good bowel sounds. Extremities: no cyanosis, clubbing, rash, edema Neuro: alert & orientedx3, cranial nerves grossly intact. moves all 4 extremities w/o difficulty. Affect pleasant  ICD interrogated (personally reviewed): HL Score 0 Volume ok No VT/AF Activity level 1.3hr/day   ASSESSMENT & PLAN:  Chronic Systolic HF due to iCM - Echo 7991 EF 15% in setting of polysubstance abuse.  - Echo 2/09 EF 55-60% - Echo 10/20 EF 55-60% no RWMA.  - Echo 8/21 EF 25-30% - Cath 8/21 LAD 100% LCx 20% RCA 10% - cMRI 9/21 EF 26% Large anterior infarct - BosSCi ICD placed 10/21 for VT - ICD interrogation. HL score 11. No VT/AF. Fluid ok AL 5.1 h/day Personally reviewed - Echo 12/21 EF 30-35% - Echo 6/22 EF 40-45%  - Echo 6/23 EF 40-45% (read as 35-40%)  - Echo 7/24 EF 35-40%  - TEE 9/24 EF 25% in setting of AF/VT - Echo 10/24 EF 30-35% (probably closer to 35%).  - NYHA I. Volume ok Encouraged  him to keep up activity from CR - Continue carvedilol  3.125 mg bid - Continue Entresto  49/51 bid. - Continue Farxiga  10 mg daily. - Continue spiro 25 mg daily. - BP too low to titrate GDMT further. If SBP drops into 80s or becomes symptomatic can drop Entresto  to 24/26 bid - Labs today  2. CAD - cath 8/21 subacute LAD infarct and CTO LAD - No s/s angina - Continue ASA, statin.  - Followed by Lipid clinic - LDL 2/25 was 28  3. VT - s/p ICD 10/21 - s/p  ICD shock in 9/24 - Felt to be related to LAD scar  - Trying to wean amio again - Continue mexilitene 300 bid and amio 100 daily.  - Also considering possible VT ablation at [facility] but would like to manage medically if possible. D/w EP last month (note from 04/08/24 reviewed). Would like to get him off amio eventually.  - We discussed amio wean to 100 every other day but will hold for now. If no VT/AF in 3months will reconsider - Keep K. 4.0 Mg > 2.0  4. Persistent AF - s/p AF ablation 11/24. - Remains in NSR today and on ICD. On amio.Weaning plan as above - Continue Eliquis  5 mg bid.  - No bleeding  5. Tobacco use - Has been quit since 9/204. - No change  6. DM2 - Continue SGLT2i, metformin , GLP1-RA  7. Hx Possible TIA - Carotid dopplers ok - Brain MRI/MRA. Vasculature ok. Findings suspicious for cardio-embolism.   8. Hypothyroidism - Continue synthroid  - Per PCP  9. Obesity - Body mass index is 28.12 kg/m. - Weight is down - Continue GLP1-RA   Jolisa Intriago, MD  4:11 PM

## 2024-05-14 NOTE — Progress Notes (Signed)
 Patient is scheduled 9/3 with Dr. Byrum

## 2024-05-21 ENCOUNTER — Ambulatory Visit (INDEPENDENT_AMBULATORY_CARE_PROVIDER_SITE_OTHER): Payer: BC Managed Care – PPO

## 2024-05-21 ENCOUNTER — Other Ambulatory Visit: Payer: Self-pay | Admitting: Family Medicine

## 2024-05-21 DIAGNOSIS — I255 Ischemic cardiomyopathy: Secondary | ICD-10-CM | POA: Diagnosis not present

## 2024-05-22 LAB — CUP PACEART REMOTE DEVICE CHECK
Battery Remaining Longevity: 126 mo
Battery Remaining Percentage: 100 %
Brady Statistic RA Percent Paced: 0 %
Brady Statistic RV Percent Paced: 0 %
Date Time Interrogation Session: 20250710004100
HighPow Impedance: 76 Ohm
Implantable Lead Connection Status: 753985
Implantable Lead Connection Status: 753985
Implantable Lead Implant Date: 20211011
Implantable Lead Implant Date: 20211011
Implantable Lead Location: 753859
Implantable Lead Location: 753860
Implantable Lead Model: 138
Implantable Lead Model: 7841
Implantable Lead Serial Number: 1097338
Implantable Lead Serial Number: 303261
Implantable Pulse Generator Implant Date: 20211011
Lead Channel Impedance Value: 643 Ohm
Lead Channel Impedance Value: 717 Ohm
Lead Channel Setting Pacing Amplitude: 2.5 V
Lead Channel Setting Pacing Amplitude: 2.5 V
Lead Channel Setting Pacing Pulse Width: 0.4 ms
Lead Channel Setting Sensing Sensitivity: 0.5 mV
Pulse Gen Serial Number: 228262
Zone Setting Status: 755011

## 2024-05-27 ENCOUNTER — Ambulatory Visit: Payer: Self-pay | Admitting: Cardiology

## 2024-06-15 ENCOUNTER — Other Ambulatory Visit (HOSPITAL_COMMUNITY)

## 2024-06-18 ENCOUNTER — Other Ambulatory Visit: Payer: Self-pay

## 2024-06-18 MED ORDER — METHOCARBAMOL 500 MG PO TABS: 500.0000 mg | ORAL_TABLET | Freq: Four times a day (QID) | ORAL | 0 refills | Status: DC | PRN

## 2024-06-24 ENCOUNTER — Ambulatory Visit
Admission: RE | Admit: 2024-06-24 | Discharge: 2024-06-24 | Disposition: A | Discharge status: Home / Self Care | Source: Ambulatory Visit | Attending: Emergency Medicine | Admitting: Emergency Medicine

## 2024-06-24 DIAGNOSIS — R918 Other nonspecific abnormal finding of lung field: Secondary | ICD-10-CM | POA: Insufficient documentation

## 2024-07-01 ENCOUNTER — Other Ambulatory Visit: Payer: Self-pay | Admitting: Family Medicine

## 2024-07-01 ENCOUNTER — Other Ambulatory Visit (HOSPITAL_COMMUNITY): Payer: Self-pay | Admitting: Internal Medicine

## 2024-07-02 ENCOUNTER — Other Ambulatory Visit (HOSPITAL_COMMUNITY): Payer: Self-pay

## 2024-07-02 ENCOUNTER — Telehealth (HOSPITAL_COMMUNITY): Payer: Self-pay

## 2024-07-02 NOTE — Telephone Encounter (Signed)
 [facility] Patient Advocate Encounter  Prior authorization for Entresto  has been submitted and approved. Test billing returns $48 for 90 day supply of generic. Spoke to pharmacy to reprocess, confirmed $16 copay for 30 days as there are not sufficient refills for a 90 day supply to be filled at this time.  Key: [id number] Effective: 07/02/2024 to 07/02/2025  Rachel DEL, CPhT Rx Patient Advocate Phone: [phone]

## 2024-07-15 ENCOUNTER — Ambulatory Visit: Admitting: Emergency Medicine

## 2024-07-15 ENCOUNTER — Encounter: Payer: Self-pay | Admitting: Emergency Medicine

## 2024-07-15 VITALS — BP 96/59 | HR 72 | Ht 74.0 in | Wt 218.0 lb

## 2024-07-15 DIAGNOSIS — R918 Other nonspecific abnormal finding of lung field: Secondary | ICD-10-CM

## 2024-07-15 NOTE — Progress Notes (Signed)
 Subjective:    Patient ID: Jason Floyd, male    DOB: 06-Feb-1966, 58 y.o.   MRN: [id number]  HPI   ROV 07/15/2024 --follow-up visit 58 year old gentleman whom I saw for pulmonary nodules on CT scan of the chest.  He has a history of atrial fibrillation/ablation, hypertension, CHF with an AICD in place, diabetes, CAD with MI, pulmonary embolism.  His nodules were originally identified on a cardiac CT 08/2023, were confirmed on a dedicated CT 09/25/2023.  Follow-up CT 01/09/2024 showed little change in his pulmonary nodules although the right lower lobe pulmonary nodule that had some cavitation was now more solid in appearance.  We repeated in 6 months as below  CT scan of the chest 06/24/2024 reviewed by me, shows diffuse bilateral bronchial wall thickening but unchanged subsolid nodule in the superior segment of the right lower lobe 1.9 x 1.0 cm.  There is also an unchanged solid nodule in the dependent right lung base at 8 mm.   Review of Systems As per HPI  Past Medical History:  Diagnosis Date   AICD (automatic cardioverter/defibrillator) present    Allergy    Arthritis    Atrial fibrillation (HCC)    CHF (congestive heart failure) (HCC)    CHF (congestive heart failure) (HCC)    Diabetes mellitus without complication (HCC)    Drug abuse (HCC)    Eczema    GERD (gastroesophageal reflux disease)    Gout    Heart attack (HCC) 06/10/2020   History of kidney stones    Hyperlipidemia    Hypertension    MI (myocardial infarction) (HCC)    PE (pulmonary embolism)    TIA (transient ischemic attack)    hx of per pt - pt not aware he had not followed by a neurologist   Ventricular tachycardia (HCC)      Family History  Problem Relation Age of Onset   Dementia Mother        frontal-temporal   Stroke Mother    Breast cancer Maternal Aunt    Other Brother        overdose   Colon cancer Neg Hx      Social History   Socioeconomic History   Marital status: Married     Spouse name: [name]   Number of children: Not on file   Years of education: Not on file   Highest education level: Bachelor's degree (e.g., BA, AB, BS)  Occupational History   Not on file  Tobacco Use   Smoking status: Former    Current packs/day: 0.00    Average packs/day: 0.3 packs/day for 31.0 years (7.8 ttl pk-yrs)    Types: Cigarettes    Quit date: 07/15/2023    Years since quitting: 1.0   Smokeless tobacco: Never   Tobacco comments:    Former smoker 11/01/23  Vaping Use   Vaping status: Never Used  Substance and Sexual Activity   Alcohol use: Yes    Comment: once a month   Drug use: Not Currently    Types: Marijuana    Comment: gummies every days   Sexual activity: Not on file  Other Topics Concern   Not on file  Social History Narrative   Lives with wife   Caffeine- coffee 4 c daily   Social Drivers of Health   Financial Resource Strain: Low Risk  (12/17/2023)   Overall Financial Resource Strain (CARDIA)    Difficulty of Paying Living Expenses: Not hard at all  Food Insecurity: No  Food Insecurity (12/17/2023)   Hunger Vital Sign    Worried About Running Out of Food in the Last Year: Never true    Ran Out of Food in the Last Year: Never true  Transportation Needs: No Transportation Needs (12/17/2023)   PRAPARE - Administrator, Civil Service (Medical): No    Lack of Transportation (Non-Medical): No  Physical Activity: Insufficiently Active (12/17/2023)   Exercise Vital Sign    Days of Exercise per Week: 3 days    Minutes of Exercise per Session: 30 min  Stress: No Stress Concern Present (12/17/2023)   [employer] of Occupational Health - Occupational Stress Questionnaire    Feeling of Stress : Only a little  Social Connections: Moderately Integrated (12/17/2023)   Social Connection and Isolation Panel    Frequency of Communication with Friends and Family: More than three times a week    Frequency of Social Gatherings with Friends and Family: Once a week     Attends Religious Services: Never    Database administrator or Organizations: Yes    Attends Engineer, structural: More than 4 times per year    Marital Status: Married  Catering manager Violence: Not At Risk (07/17/2023)   Humiliation, Afraid, Rape, and Kick questionnaire    Fear of Current or Ex-Partner: No    Emotionally Abused: No    Physically Abused: No    Sexually Abused: No    - Smoked for 37 years, quit on September 2nd   No Known Allergies   Outpatient Medications Prior to Visit  Medication Sig Dispense Refill   allopurinol  (ZYLOPRIM ) 300 MG tablet TAKE 2 TABLETS (600 MG TOTAL) BY MOUTH DAILY. 60 tablet 6   amiodarone  (PACERONE ) 200 MG tablet Take 0.5 tablets (100 mg total) by mouth daily.     aspirin  EC 81 MG tablet Take 81 mg by mouth daily. Swallow whole.     atorvastatin  (LIPITOR) 80 MG tablet TAKE 1 TABLET BY MOUTH DAILY. 30 tablet 11   calcium  carbonate (TUMS - DOSED IN MG ELEMENTAL CALCIUM ) 500 MG chewable tablet Chew 2-3 tablets by mouth daily as needed for indigestion or heartburn.     carvedilol  (COREG ) 3.125 MG tablet TAKE 1 TABLET BY MOUTH 2 TIMES DAILY. 180 tablet 3   cetirizine (ZYRTEC) 10 MG tablet Take 10 mg by mouth daily.     cyclobenzaprine  (FLEXERIL ) 10 MG tablet Take 1 tablet (10 mg total) by mouth 3 (three) times daily as needed for muscle spasms. 45 tablet 2   ELIQUIS  5 MG TABS tablet TAKE 1 TABLET BY MOUTH 2 TIMES A DAY 60 tablet 11   ENTRESTO  49-51 MG TAKE 1 TABLET BY MOUTH 2 TIMES DAILY. 60 tablet 3   Evolocumab  (REPATHA  SURECLICK) 140 MG/ML SOAJ INJECT 140 MG INTO THE SKIN EVERY 14 DAYS. 6 mL 1   FARXIGA  10 MG TABS tablet TAKE 1 TABLET BY MOUTH DAILY BEFORE BREAKFAST. 90 tablet 2   fenofibrate  160 MG tablet TAKE 1 TABLET BY MOUTH DAILY. 90 tablet 1   furosemide  (LASIX ) 20 MG tablet Take 1 tablet by mouth daily as needed for swelling or a weight gain of 3 pounds or more in 24 hours or 5 pounds in one week. 30 tablet 5   magnesium  oxide  (MAG-OX) 400 MG tablet Take 1 tab by mouth Twice daily 60 tablet 12   methocarbamol  (ROBAXIN ) 500 MG tablet Take 1 tablet (500 mg total) by mouth every 6 (six) hours as  needed for muscle spasms. 30 tablet 0   mexiletine (MEXITIL ) 150 MG capsule TAKE 2 CAPSULES BY MOUTH 2 TIMES DAILY. 120 capsule 11   Naphazoline HCl (CLEAR EYES OP) Place 1 drop into both eyes daily as needed (sore eyes).     pantoprazole  (PROTONIX ) 40 MG tablet TAKE 1 TABLET BY MOUTH DAILY. 30 tablet 3   potassium chloride  SA (KLOR-CON  M20) 20 MEQ tablet Take 1 tablet by mouth when taking Lasix  30 tablet 5   Semaglutide , 2 MG/DOSE, (OZEMPIC , 2 MG/DOSE,) 8 MG/3ML SOPN Inject 2 mg into the skin once a week. 3 mL 11   sodium chloride  (OCEAN) 0.65 % SOLN nasal spray Place 1 spray into both nostrils as needed (dryness).     spironolactone  (ALDACTONE ) 25 MG tablet TAKE 1 TABLET (25 MG TOTAL) BY MOUTH AT BEDTIME. 30 tablet 7   traZODone  (DESYREL ) 100 MG tablet TAKE 1 TABLET (100 MG TOTAL) BY MOUTH AT BEDTIME. 90 tablet 1   triamcinolone  (KENALOG ) 0.025 % ointment Apply 1 Application topically daily as needed (Dry skin).     VASCEPA  1 g capsule TAKE 2 CAPSULES BY MOUTH 2 TIMES DAILY. 120 capsule 4   No facility-administered medications prior to visit.        Objective:   Physical Exam Vitals:   07/15/24 1351  BP: (!) 96/59  Pulse: 72  SpO2: 96%  Weight: 218 lb (98.9 kg)  Height: 6' 2 (1.88 m)   Gen: Pleasant, well-nourished, in no distress,  normal affect  ENT: No lesions,  mouth clear,  oropharynx clear, no postnasal drip  Neck: No JVD, no stridor  Lungs: No use of accessory muscles, no crackles or wheezing on normal respiration, no wheeze on forced expiration  Cardiovascular: RRR, 2/6 systolic murmur, no lower extremity edema  Musculoskeletal: No deformities, no cyanosis or clubbing  Neuro: alert, awake, non focal  Skin: Warm, no lesions or rash      Assessment & Plan:  Pulmonary nodules This pulmonary  nodules have not changed in size between February and August.  Good news.  There are still some suspicious characteristics.  The right upper lobe nodule is mixed density, could represent adenocarcinoma.  The right lower lobe nodule was initially more cavitary in nature, question impacted mucus and airway versus inflammatory/infectious process?  Both need to be followed closely.  If they change in any way then would recommend navigational bronchoscopy to obtain a tissue diagnosis.  Plan for next scan in 6 months.   Lamar Chris, MD, PhD 07/15/2024, 2:30 PM [facility] [phone] or if no answer before 7:00PM call [phone] For any issues after 7:00PM please call eLink [phone]

## 2024-07-15 NOTE — Patient Instructions (Signed)
 We reviewed your CT scans of the chest today. Will plan to continue close surveillance of your pulmonary nodules with a repeat CT scan of the chest in February 2026. Please follow with Dr. Shelah in February after your CT so we can review those results together

## 2024-07-15 NOTE — Assessment & Plan Note (Signed)
 This pulmonary nodules have not changed in size between February and August.  Good news.  There are still some suspicious characteristics.  The right upper lobe nodule is mixed density, could represent adenocarcinoma.  The right lower lobe nodule was initially more cavitary in nature, question impacted mucus and airway versus inflammatory/infectious process?  Both need to be followed closely.  If they change in any way then would recommend navigational bronchoscopy to obtain a tissue diagnosis.  Plan for next scan in 6 months.

## 2024-07-16 ENCOUNTER — Other Ambulatory Visit: Payer: Self-pay | Admitting: Family Medicine

## 2024-07-16 ENCOUNTER — Other Ambulatory Visit (HOSPITAL_COMMUNITY): Payer: Self-pay | Admitting: Internal Medicine

## 2024-07-23 ENCOUNTER — Telehealth: Payer: Self-pay | Admitting: Pharmacy Technician

## 2024-07-23 ENCOUNTER — Encounter: Payer: Self-pay | Admitting: Family Medicine

## 2024-07-23 ENCOUNTER — Other Ambulatory Visit (HOSPITAL_COMMUNITY): Payer: Self-pay

## 2024-07-23 NOTE — Telephone Encounter (Signed)
 SABRA

## 2024-07-23 NOTE — Telephone Encounter (Signed)
   Pharmacy Patient Advocate Encounter   Received notification from CoverMyMeds that prior authorization for ozempic  is required/requested.   Insurance verification completed.   The patient is insured through [facility] .   Per test claim: PA required; PA started via CoverMyMeds. KEY [id number] . Waiting for clinical questions to populate.

## 2024-07-23 NOTE — Telephone Encounter (Signed)
 Patient would like to get shingles vaccine and flu shot. He is requesting COVID vaccine but I plan to call him about that once I get word from you about the shingles vaccine. Okay for nurse visit?

## 2024-07-24 MED ORDER — COVID-19 MRNA VAC-TRIS(PFIZER) 30 MCG/0.3ML IM SUSY: 0.3000 mL | PREFILLED_SYRINGE | Freq: Once | INTRAMUSCULAR | 0 refills | Status: AC

## 2024-07-24 NOTE — Telephone Encounter (Signed)
 Left VM and sent MyChart message for patient to call back to office to schedule nurse visit for vaccines.

## 2024-07-24 NOTE — Addendum Note (Signed)
 Addended by: Jesselyn Rask E on: 07/24/2024 03:52 PM   Modules accepted: Orders

## 2024-07-27 ENCOUNTER — Ambulatory Visit (INDEPENDENT_AMBULATORY_CARE_PROVIDER_SITE_OTHER)

## 2024-07-27 DIAGNOSIS — Z23 Encounter for immunization: Secondary | ICD-10-CM | POA: Diagnosis not present

## 2024-07-27 NOTE — Progress Notes (Signed)
 Jason Floyd is a 58 y.o. male presents to the office today for flu and shingles vaccine per physician's orders. Injection was administered Intramuscular Left deltoid for Flu vaccine and Right deltoid for Shingles Vaccine.   Patient's next injection due 09/25/2024, appt made? yes 11/17  [name]

## 2024-07-29 ENCOUNTER — Other Ambulatory Visit (HOSPITAL_COMMUNITY): Payer: Self-pay | Admitting: Internal Medicine

## 2024-07-29 ENCOUNTER — Ambulatory Visit

## 2024-07-29 DIAGNOSIS — I5042 Chronic combined systolic (congestive) and diastolic (congestive) heart failure: Secondary | ICD-10-CM

## 2024-08-05 ENCOUNTER — Encounter: Admitting: Internal Medicine

## 2024-08-12 ENCOUNTER — Other Ambulatory Visit: Payer: Self-pay | Admitting: Family Medicine

## 2024-08-17 ENCOUNTER — Other Ambulatory Visit: Payer: Self-pay | Admitting: Family Medicine

## 2024-08-20 ENCOUNTER — Ambulatory Visit (INDEPENDENT_AMBULATORY_CARE_PROVIDER_SITE_OTHER): Payer: BC Managed Care – PPO

## 2024-08-20 DIAGNOSIS — I5042 Chronic combined systolic (congestive) and diastolic (congestive) heart failure: Secondary | ICD-10-CM

## 2024-08-20 LAB — CUP PACEART REMOTE DEVICE CHECK
Battery Remaining Longevity: 120 mo
Battery Remaining Percentage: 100 %
Brady Statistic RA Percent Paced: 0 %
Brady Statistic RV Percent Paced: 0 %
Date Time Interrogation Session: 20251009004100
HighPow Impedance: 80 Ohm
Implantable Lead Connection Status: 753985
Implantable Lead Connection Status: 753985
Implantable Lead Implant Date: 20211011
Implantable Lead Implant Date: 20211011
Implantable Lead Location: 753859
Implantable Lead Location: 753860
Implantable Lead Model: 138
Implantable Lead Model: 7841
Implantable Lead Serial Number: 1097338
Implantable Lead Serial Number: 303261
Implantable Pulse Generator Implant Date: 20211011
Lead Channel Impedance Value: 644 Ohm
Lead Channel Impedance Value: 647 Ohm
Lead Channel Setting Pacing Amplitude: 2.5 V
Lead Channel Setting Pacing Amplitude: 2.5 V
Lead Channel Setting Pacing Pulse Width: 0.4 ms
Lead Channel Setting Sensing Sensitivity: 0.5 mV
Pulse Gen Serial Number: 228262
Zone Setting Status: 755011

## 2024-08-21 ENCOUNTER — Ambulatory Visit: Payer: Self-pay | Admitting: Cardiology

## 2024-08-21 NOTE — Progress Notes (Signed)
 Remote ICD Transmission

## 2024-08-24 ENCOUNTER — Other Ambulatory Visit (HOSPITAL_COMMUNITY): Payer: Self-pay | Admitting: Family Medicine

## 2024-08-24 ENCOUNTER — Other Ambulatory Visit: Payer: Self-pay | Admitting: Family Medicine

## 2024-08-25 ENCOUNTER — Telehealth: Payer: Self-pay

## 2024-08-25 MED ORDER — METHOCARBAMOL 500 MG PO TABS: 500.0000 mg | ORAL_TABLET | Freq: Four times a day (QID) | ORAL | 3 refills | Status: AC | PRN

## 2024-08-25 NOTE — Addendum Note (Signed)
 Addended by: Denys Labree E on: 08/25/2024 02:40 PM   Modules accepted: Orders

## 2024-08-25 NOTE — Telephone Encounter (Signed)
 Prescription sent to pharmacy.

## 2024-08-25 NOTE — Telephone Encounter (Signed)
 Sent to PCP ?

## 2024-08-25 NOTE — Progress Notes (Signed)
 Remote ICD Transmission

## 2024-08-29 ENCOUNTER — Encounter: Payer: Self-pay | Admitting: Family Medicine

## 2024-08-31 ENCOUNTER — Emergency Department (HOSPITAL_COMMUNITY)
Admission: EM | Admit: 2024-08-31 | Discharge: 2024-08-31 | Disposition: A | Discharge status: Home / Self Care | Attending: Emergency Medicine | Admitting: Emergency Medicine

## 2024-08-31 ENCOUNTER — Emergency Department (HOSPITAL_COMMUNITY)

## 2024-08-31 ENCOUNTER — Other Ambulatory Visit: Payer: Self-pay

## 2024-08-31 ENCOUNTER — Ambulatory Visit: Payer: Self-pay

## 2024-08-31 ENCOUNTER — Telehealth: Payer: Self-pay

## 2024-08-31 ENCOUNTER — Ambulatory Visit: Admitting: Student in an Organized Health Care Education/Training Program

## 2024-08-31 VITALS — BP 94/62 | HR 85 | Wt 215.0 lb

## 2024-08-31 DIAGNOSIS — S0990XA Unspecified injury of head, initial encounter: Secondary | ICD-10-CM | POA: Diagnosis not present

## 2024-08-31 DIAGNOSIS — I472 Ventricular tachycardia, unspecified: Secondary | ICD-10-CM

## 2024-08-31 DIAGNOSIS — S0012XA Contusion of left eyelid and periocular area, initial encounter: Secondary | ICD-10-CM | POA: Insufficient documentation

## 2024-08-31 DIAGNOSIS — Z7982 Long term (current) use of aspirin: Secondary | ICD-10-CM | POA: Diagnosis not present

## 2024-08-31 DIAGNOSIS — I4901 Ventricular fibrillation: Secondary | ICD-10-CM | POA: Diagnosis not present

## 2024-08-31 DIAGNOSIS — W19XXXA Unspecified fall, initial encounter: Secondary | ICD-10-CM | POA: Insufficient documentation

## 2024-08-31 DIAGNOSIS — W01198A Fall on same level from slipping, tripping and stumbling with subsequent striking against other object, initial encounter: Secondary | ICD-10-CM | POA: Diagnosis not present

## 2024-08-31 DIAGNOSIS — Z7901 Long term (current) use of anticoagulants: Secondary | ICD-10-CM | POA: Diagnosis not present

## 2024-08-31 LAB — TSH: TSH: 4.177 u[IU]/mL (ref 0.350–4.500)

## 2024-08-31 LAB — CBC WITH DIFFERENTIAL/PLATELET
Abs Immature Granulocytes: 0.04 K/uL (ref 0.00–0.07)
Basophils Absolute: 0 K/uL (ref 0.0–0.1)
Basophils Relative: 0 %
Eosinophils Absolute: 0.1 K/uL (ref 0.0–0.5)
Eosinophils Relative: 1 %
HCT: 47 % (ref 39.0–52.0)
Hemoglobin: 15.6 g/dL (ref 13.0–17.0)
Immature Granulocytes: 0 %
Lymphocytes Relative: 25 %
Lymphs Abs: 2.5 K/uL (ref 0.7–4.0)
MCH: 31.6 pg (ref 26.0–34.0)
MCHC: 33.2 g/dL (ref 30.0–36.0)
MCV: 95.1 fL (ref 80.0–100.0)
Monocytes Absolute: 0.8 K/uL (ref 0.1–1.0)
Monocytes Relative: 8 %
Neutro Abs: 6.3 K/uL (ref 1.7–7.7)
Neutrophils Relative %: 66 %
Platelets: 226 K/uL (ref 150–400)
RBC: 4.94 MIL/uL (ref 4.22–5.81)
RDW: 12.9 % (ref 11.5–15.5)
WBC: 9.8 K/uL (ref 4.0–10.5)
nRBC: 0 % (ref 0.0–0.2)

## 2024-08-31 LAB — BASIC METABOLIC PANEL WITH GFR
Anion gap: 12 (ref 5–15)
BUN: 17 mg/dL (ref 6–20)
CO2: 23 mmol/L (ref 22–32)
Calcium: 9.2 mg/dL (ref 8.9–10.3)
Chloride: 102 mmol/L (ref 98–111)
Creatinine, Ser: 1.32 mg/dL — ABNORMAL HIGH (ref 0.61–1.24)
GFR, Estimated: >60 mL/min (ref >60)
Glucose, Bld: 119 mg/dL — ABNORMAL HIGH (ref 70–99)
Potassium: 3.9 mmol/L (ref 3.5–5.1)
Sodium: 137 mmol/L (ref 135–145)

## 2024-08-31 LAB — MAGNESIUM: Magnesium: 1.8 mg/dL (ref 1.7–2.4)

## 2024-08-31 LAB — TROPONIN I (HIGH SENSITIVITY): Troponin I (High Sensitivity): 31 ng/L — ABNORMAL HIGH (ref <18)

## 2024-08-31 MED ORDER — MAGNESIUM OXIDE -MG SUPPLEMENT 400 (240 MG) MG PO TABS
800.0000 mg | ORAL_TABLET | Freq: Once | ORAL | Status: AC
  Administered 2024-08-31: 800 mg via ORAL
  Filled 2024-08-31: qty 2

## 2024-08-31 NOTE — ED Triage Notes (Signed)
 Pt sent by PCP for further eval.  Jason Floyd and hit head.  Unknown LOC. On Eliquis .  PT was told by device clinic he went into [facility] and was shocked twice. PT is no distress at this time.

## 2024-08-31 NOTE — Assessment & Plan Note (Signed)
 Patient with a fall in the bathroom on Friday night due to an episode of VT and syncope.  He is anticoagulated on Eliquis .  He has ecchymosis of the left eye.  Neuroexam is reassuring.  Because of the anticoagulation, and the facial trauma, I recommended a CT head to rule out facial fracture and small subdural hematoma.  I am sending the patient to the emergency department because of the VT, and they can consider doing that CT head there.

## 2024-08-31 NOTE — Progress Notes (Signed)
 Acute Office Visit  Subjective:     Patient ID: Jason Floyd, male    DOB: 1966/06/21, 58 y.o.   MRN: [id number]  Chief Complaint  Patient presents with   Diarrhea    Started Friday with vominting and diarrhea. Felt okay yesterday and feels okay today but not great. Fell Friday night in the bathroom on tile. Vtach on Friday evening and pacemaker did shock back to rhythm  Would like blood work completed  Please see triage notes     HPI  Discussed the use of AI scribe software for clinical note transcription with the patient, who gave verbal consent to proceed.  History of Present Illness Jason Floyd is a 58 year old male with congestive heart failure and arrhythmia who presents with a fall and head injury after an episode of VTAC.  He experienced a fall and head injury on Friday night [place] Dehydration led to nausea, vomiting, and diarrhea. While rushing to the bathroom, he had an episode of ventricular tachycardia (VTAC) that required his implanted cardioverter-defibrillator (ICD) to cardiovert him twice. He fell face down on the tile floor, resulting in a significant bruise and swelling around his eye. Ice application reduced the swelling, but he still has a 'giant goose egg' and bruising around the eye.  He feels 'really crappy,' 'draggy,' and 'puny' today, with a recurrence of diarrhea. Initially suspecting food poisoning, he took Imodium on Friday night, which helped, and had a normal day on Saturday, but symptoms returned today. He is currently taking Eliquis  and has been drinking sugar-free Gatorade to stay hydrated. He reports no headache and describes the pain as 'not bad,' with some tenderness around the bruised area, especially when pressure is applied. He has not taken antibiotics recently.  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 He has an ICD implanted and has undergone atrial ablation, with no atrial fibrillation since the procedure. He is due for an echocardiogram soon.  During the review of symptoms, he denies any changes in vision or significant pain, except for some tenderness around the bruised area. No headache and he has been able to drink fluids today. He quit smoking 14 months ago after his ICD shocked him three times in two days.      Objective:    BP 94/62 (BP Location: Left Arm, Cuff Size: Normal)   Pulse 85   Wt 215 lb (97.5 kg)   SpO2 97%   BMI 27.60 kg/m   Physical Exam  Gen: Tired appearing man Face: Around his left eye he has ecchymosis medially, there is tenderness over the eyebrow and maxilla.  Eye: No conjunctival hemorrhage Heart: Irregular rhythm Lungs: Unlabored, clear throughout Neuro: Alert, conversational, full strength upper and lower extremities, normal get up and go, normal sensation      Assessment & Plan:   Problem List Items Addressed This Visit       Unprioritized   V-tach [facility]) - Primary   Device company reports that he had 2 episodes of V. tach on Friday nights that were treated successfully with his AICD.  This was associated with symptoms of nausea, vomiting earlier in the day.  He felt off for the rest of the weekend.  He has had no chest pain, no classic ischemic symptoms, but the nausea could be atypical symptoms of ischemia.  He has had ischemic heart disease in the past.  He has had VT episodes in the past.  We checked stat labs here.  Because he still symptomatic, I recommended that he go to the emergency department at [facility] for ischemic evaluation and monitoring for further arrhythmia.      Relevant Orders   Comprehensive metabolic panel with GFR   Magnesium    CBC with Differential/Platelet   EKG 12-Lead   Head injury due to trauma   Patient with a fall in the bathroom on Friday night due to an episode of VT  and syncope.  He is anticoagulated on Eliquis .  He has ecchymosis of the left eye.  Neuroexam is reassuring.  Because of the anticoagulation, and the facial trauma, I recommended a CT head to rule out facial fracture and small subdural hematoma.  I am sending the patient to the emergency department because of the VT, and they can consider doing that CT head there.       No orders of the defined types were placed in this encounter.   Cleatus Debby Specking, MD

## 2024-08-31 NOTE — ED Notes (Addendum)
 Discharge instructions reviewed.   Opportunity for questions and concerns provided.   Alert, oriented and ambulatory.   Displays no signs of distress.   Encouraged to keep appointment with Cardiologist tomorrow.

## 2024-08-31 NOTE — Telephone Encounter (Signed)
 Alert remote transmission: Ventricular shock therapy delivered to convert arrhythmia Event occurred 10/17 @ 22:32, duration 57sec, HR 302.  EGM c/w abrupt onset VT/VF, 41J of HV therapy delivered x2 converting arrhythmia.  Route to triage high alert per protocol Presenting AS/VS with PVC's  Patient states that he overworked himself on Friday.  By evening, felt weak, had nausea vomiting and diarrhea and around time of event remembers possibly passing out.  He feel forward on the tile in the bathroom and hit his head.  He is feeling better since but still not back to 100%.    Appointment made with Daphne Barrack, NP for tomorrow at 850am. [place] DMV driving restrictions given for 6months. ER recommendation given if patient has severe symptoms or receives any further shocks in interim.  He was given device clinic number to call as well if he has any further concerns.

## 2024-08-31 NOTE — ED Provider Triage Note (Signed)
 Emergency Medicine Provider Triage Evaluation Note  Jason Floyd , a 58 y.o. male  was evaluated in triage.  Presents today with concern for a syncopal episode that happened Friday 08/28/24.  He states he was walking to the restroom when he felt suddenly very dizzy and passed out.  He was called by his cardiologist who informed him that he had a run of V. tach that seem to correspond with the time he had his syncopal episode.  He was told to come to the ER. Patient denies any further dizziness, chest pain, shortness of breath since this episode. He did hit his head and is on eliquis .   Review of Systems  Positive: As above Negative: As above  Physical Exam  BP 120/81   Pulse 88   Temp 98 F (36.7 C)   Resp 17   SpO2 99%  Gen:   Awake, no distress   Resp:  Normal effort  MSK:   Moves extremities without difficulty  Other:  Bruising around the left eye.  No cervical, thoracic, or lumbar spinal tenderness palpation  Medical Decision Making  Medically screening exam initiated at 5:20 PM.  Appropriate orders placed.  Jason Floyd was informed that the remainder of the evaluation will be completed by another provider, this initial triage assessment does not replace that evaluation, and the importance of remaining in the ED until their evaluation is complete.  PVCs noted on initial EKG, the patient not in V. tach currently.   [name], PA-C 08/31/24 1720

## 2024-08-31 NOTE — Telephone Encounter (Addendum)
 Patient was called today by device clinic. Patient was shocked twice on Friday night by implanted defibrillator. Patient reports he was having vomiting and diarrhea during the day on Friday. Patient states Friday night he was heading to the bathroom, realized he was getting shocked causing him to fall to the floor and hit his head. No LOC. +blood thinners. Patient reports minimal swelling and bruising to his face. Patient is scheduled for an appointment with device clinic tomorrow morning. Initially recommended patient to be seen in office today at 3:40 PM. Called patient back after reviewing with the recommendation of being seen in the ED due to hitting his head and being on blood thinners. Patient refused due to wait time in the ED. Patient wants to keep appointment in office this afternoon. Patient denies CP, SOB, dizziness, weakness or headache. CAL called and notified of ED refusal.   FYI Only or Action Required?: FYI only for provider.  Patient was last seen in primary care on 12/18/2023 by Mahlon Comer BRAVO, MD.  Called Nurse Triage reporting Fall and Head Injury.  Symptoms began Friday-currently no symptoms.  Interventions attempted: Rest, hydration, or home remedies.  Symptoms are: stable.  Triage Disposition: Call PCP Now, Go to ED Now (or PCP Triage)  Patient/caregiver understands and will follow disposition?: No, refuses disposition Appointment was made for today prior to ED disposition. Patient is scheduled for 3:40 PM today.     Reason for Disposition  Taking Coumadin (warfarin) or other strong blood thinner, or known bleeding disorder (e.g., thrombocytopenia)  [1] Caller has URGENT question AND [2] triager unable to answer question  Answer Assessment - Initial Assessment Questions 1. MECHANISM: How did the fall happen?     Vomiting/diarrhea and fall on Friday night-fell due to being shocked due to V tach 2. DOMESTIC VIOLENCE AND ELDER ABUSE SCREENING: Did you fall because  someone pushed you or tried to hurt you? If Yes, ask: Are you safe now?     no 3. ONSET: When did the fall happen? (e.g., minutes, hours, or days ago)     Fall occurred on Friday 4. LOCATION: What part of the body hit the ground? (e.g., back, buttocks, head, hips, knees, hands, head, stomach)     head 5. INJURY: Did you hurt (injure) yourself when you fell? If Yes, ask: What did you injure? Tell me more about this? (e.g., body area; type of injury; pain severity)     Yes-face went into the floor 6. PAIN: Is there any pain? If Yes, ask: How bad is the pain? (e.g., Scale 0-10; or none, mild,      mild 7. SIZE: For cuts, bruises, or swelling, ask: How large is it? (e.g., inches or centimeters)      Slight bruising and swelling to face 9. OTHER SYMPTOMS: Do you have any other symptoms? (e.g., dizziness, fever, weakness; new-onset or worsening).      Slightly nausea 10. CAUSE: What do you think caused the fall (or falling)? (e.g., dizzy spell, tripped)       V tach per device  Answer Assessment - Initial Assessment Questions 1. MECHANISM: How did the injury happen? For falls, ask: What height did you fall from? and What surface did you fall against?      [place] after being shocked implanted defibrillator 2. ONSET: When did the injury happen? (e.g., minutes, hours ago)      Occurred on Friday 3. NEUROLOGIC SYMPTOMS: Was there any loss of consciousness? Are there any other neurological symptoms?  No LOC 4. MENTAL STATUS: Does the person know who they are, who you are, and where they are?      yes 5. LOCATION: What part of the head was hit?      forehead 6. SCALP APPEARANCE: What does the scalp look like? Is it bleeding now? If Yes, ask: Is it difficult to stop?      No bleeding 7. SIZE: For cuts, bruises, or swelling, ask: How large is it? (e.g., inches or centimeters)      Some bruising and slight swelling 8. PAIN: Is there any pain? If Yes,  ask: How bad is it? (Scale 0-10; or none, mild, moderate, severe)     mild 10. BLOOD THINNERS: Do you take any blood thinners? (e.g., aspirin , clopidogrel / Plavix, coumadin, heparin ). Notes: Other strong blood thinners include: Arixtra (fondaparinux), Eliquis  (apixaban ), Pradaxa (dabigatran), and Xarelto (rivaroxaban).       Eliquis  11. OTHER SYMPTOMS: Do you have any other symptoms? (e.g., neck pain, vomiting)       nause  Protocols used: Falls and Falling-A-AH, Head Injury-A-AH

## 2024-08-31 NOTE — Telephone Encounter (Signed)
 Seen message and sending this to Triage line.

## 2024-08-31 NOTE — Assessment & Plan Note (Signed)
 Device company reports that he had 2 episodes of V. tach on Friday nights that were treated successfully with his AICD.  This was associated with symptoms of nausea, vomiting earlier in the day.  He felt off for the rest of the weekend.  He has had no chest pain, no classic ischemic symptoms, but the nausea could be atypical symptoms of ischemia.  He has had ischemic heart disease in the past.  He has had VT episodes in the past.  We checked stat labs here.  Because he still symptomatic, I recommended that he go to the emergency department at [facility] for ischemic evaluation and monitoring for further arrhythmia.

## 2024-08-31 NOTE — Patient Instructions (Signed)
  VISIT SUMMARY: Today, you were seen for a fall and head injury that occurred after an episode of ventricular tachycardia (VTAC). You experienced dehydration, nausea, vomiting, and diarrhea, which led to the VTAC episode and subsequent fall. You have a significant bruise and swelling around your eye. You are currently feeling unwell with recurring diarrhea. Your past medical history includes congestive heart failure, arrhythmia, a previous heart attack, and a history of emboli. You have an implanted cardioverter-defibrillator (ICD) and have undergone atrial ablation.  YOUR PLAN: -FALL WITH HEAD TRAUMA AND PERIORBITAL ECCHYMOSIS: You sustained a head injury with bruising around your eye. Due to your use of Eliquis , there is a risk of facial fracture or bleeding in the brain. A CT scan of your head has been ordered to check for these conditions.  -RECENT VENTRICULAR TACHYCARDIA WITH ICD SHOCKS: You had an episode of ventricular tachycardia, which is a fast heart rhythm that required your ICD to deliver shocks. This can be caused by electrolyte imbalances. We will check your electrolytes, including magnesium , to see if there are any abnormalities.  -CONGESTIVE HEART FAILURE WITH REDUCED EJECTION FRACTION: You have congestive heart failure, which means your heart doesn't pump blood as well as it should. This is due to a previous heart attack. We will continue to monitor your condition.  -ATRIAL FIBRILLATION AND ATRIAL FLUTTER, STATUS POST ABLATION: You have a history of irregular heartbeats, which were treated with a procedure called ablation. We will continue to monitor your heart rhythm.  -HISTORY OF MYOCARDIAL INFARCTION: You have had a heart attack in the past, which has affected your heart's ability to pump blood effectively. We will continue to monitor your heart health.  -IMPLANTED CARDIAC DEFIBRILLATOR (ICD) IN SITU: You have an ICD implanted to manage your heart rhythm issues. It recently  delivered shocks due to your ventricular tachycardia. A follow-up appointment is scheduled to check on your ICD.  -ACUTE GASTROENTERITIS, LIKELY FOOD POISONING: You likely have food poisoning, which has caused nausea, vomiting, and diarrhea. You initially felt better after taking Imodium, but your symptoms have returned. Continue to stay hydrated by drinking sugar-free Gatorade.  INSTRUCTIONS: Please go for the CT scan of your head as ordered to check for any fractures or bleeding. We will also check your electrolytes to see if there are any imbalances. Continue to stay hydrated and monitor your symptoms. Follow up with your scheduled appointment to check on your ICD.

## 2024-08-31 NOTE — Telephone Encounter (Signed)
 Patient having diarrhea and vomiting. Patient fell going into bathroom and hit face and forehead on bathroom tile last night. Would you like to see patient in office or should he be seen at [facility]?   Please see message below

## 2024-08-31 NOTE — ED Triage Notes (Signed)
 Pt states the device clinic reported that the episode of vtach was on Friday when he fell. Pt has bruising noted to his left eye. He is AxOx4.

## 2024-08-31 NOTE — Discharge Instructions (Signed)
 Go to your cardiology appointment tomorrow.  Please return for repeat event where you lose consciousness or develop chest pain or difficulty breathing, or at any time you would like to be reevaluated.

## 2024-08-31 NOTE — Telephone Encounter (Signed)
 FYI

## 2024-08-31 NOTE — Progress Notes (Unsigned)
 Electrophysiology Office Note:   Date:  09/01/2024  ID:  Jason Floyd, DOB 09-21-1966, MRN [id number]  Primary Cardiologist: None Primary Heart Failure: Toribio Fuel, MD Electrophysiologist: Fonda Kitty, MD       History of Present Illness:   Jason Floyd is a 58 y.o. male with h/o HFrEF, VT s/p ICD, AF, MI, HTN, HLD, TIA, PE, GERD, drug abuse seen today for acute visit due to VT s/p HV therapy.    Alert Remote transmission on 08/28/24 at 2232 showed EGM with abrupt onset VT/VF, duration 57 sec, HR 302 bpm.  Pt received 41j of HV therapy x2 converting arrhythmia.   He was seen in ER on 08/31/24 with reports of a fall that occurred several days ago.  He was reportedly at the [facility] and he got dehydrated, did not feel well and collapsed striking his head. CT head / maxillofacial was negative for acute intracranial pathology.   Patient reports the evening prior to the VT event that he did have 1 glass of alcohol.  He drinks approximately 1 glass/month.  The day of the VT event he reported he woke up feeling nauseated but went on to the fair.  He was outside in the heat all day and had not hydrated very well.  Before he left the fair he vomited.  After he got home he felt dehydrated and had vomiting and diarrhea that evening.  On his way to the restroom he fell striking his face on the tile.  He had not had any sick contacts but reported he had eaten some questionable leftover jerked chicken that may have contributed to a GI symptoms.  He reports compliance with medications.  Of note he is on Ozempic  and has had decreased p.o. intake on this medication.  He denies chest pain, palpitations, dyspnea, PND, orthopnea, nausea, vomiting, dizziness, syncope, edema, weight gain, or early satiety.   Review of systems complete and found to be negative unless listed in HPI.    EP Information / Studies Reviewed:    EKG is not ordered today. EKG from 08/31/2024 reviewed which  showed SR with first-degree AV block and PVCs      ICD Interrogation-  reviewed in detail today,  See PACEART report.  Device History: [employer] seismologist ICD implanted 08/22/20 for VT / HFrEF History of appropriate therapy: Yes, 07/16/23 History of AAD therapy: Yes; currently on amiodarone , mexiletine    Arrhythmia / AAD / Pertinent EP Studies AF EPS 10/04/23 > successful PVI, ablation of posterior wall  Risk Assessment/Calculations:    CHA2DS2-VASc Score = 6   This indicates a 9.7% annual risk of stroke. The patient's score is based upon: CHF History: 1 HTN History: 1 Diabetes History: 1 Stroke History: 2 (TIA) Vascular Disease History: 1 Age Score: 0 Gender Score: 0             Physical Exam:   VS:  BP 95/60 (BP Location: Left Arm, Patient Position: Sitting, Cuff Size: Normal)   Pulse 76   Ht 6' 2 (1.88 m)   Wt 220 lb (99.8 kg)   SpO2 95%   BMI 28.25 kg/m    Wt Readings from Last 3 Encounters:  09/01/24 220 lb (99.8 kg)  08/31/24 215 lb (97.5 kg)  07/15/24 218 lb (98.9 kg)     GEN: Well nourished, well developed in no acute distress NECK: No JVD; No carotid bruits CARDIAC: Regular rate and rhythm, no murmurs, rubs, gallops RESPIRATORY:  Clear to  auscultation without rales, wheezing or rhonchi  ABDOMEN: Soft, non-tender, non-distended EXTREMITIES:  No edema; No deformity   ASSESSMENT AND PLAN:    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 Pt instructed to take his as needed potassium x 1 tablet when he gets home today / 20 mEq -Hold increase in amiodarone  given circumstances leading up to shock -continue mexiletine 150 mg BID  -TSH 4 > on levothyroxine  per PCP as was hyperthyroid -labs from ER visit last evening reviewed  -Stable on an appropriate  medical regimen -Normal ICD function -See Pace Art report -No changes today - No missed medications, suspect high-voltage therapy due to volume depletion and electrolyte disturbances -encouraged adequate hydration / PO intake  -VT zones reviewed, no adjustments needed   Persistent Atrial Fibrillation  CHA2DS2-VASc 6, s/p PVI+ PW ablation in 09/2023 -OAC for stroke prophylaxis  -0% AF burden on device -Patient reports no burden of AF since ablation -amiodarone  as above  -coreg  3.125 mg BID   Secondary Hypercoagulable State  -continue Eliquis  5mg  BID, dose reviewed and appropriate by age / wt   CAD  -no anginal symptoms   Hypertension  -well controlled on current regimen   DM  -on Ozempic     Disposition:   Follow up with Dr. Kennyth in 12 months   Signed, Daphne Barrack, NP-C, AGACNP-BC [facility] - Electrophysiology  09/01/2024, 9:18 AM

## 2024-08-31 NOTE — ED Notes (Signed)
 Cardiology at bedside interrogating device.   [employer]

## 2024-08-31 NOTE — ED Provider Notes (Signed)
 [place] [facility]MENT AT [facility] Provider Note   CSN: [id number] Arrival date & time: 08/31/24  1659     Patient presents with: Head Injury and Facial Injury   Jason Floyd is a 58 y.o. male.   58 yo M with a chief complaints of fall.  This happened a few days ago.  [place] and he felt like maybe he got dehydrated and did not feel well and ending up having episode where he collapsed and struck his head.  Since then he has felt much better.  He was notified by his remote transmission of his defibrillator that he had gone into V. tach at that moment and was shocked twice.  He was then called by a different nursing line that told him he needed to schedule an appointment with his cardiologist and then a different nursing line called him and told him he did see a doctor today.  He has an appointment with cardiology clinic tomorrow morning.  He saw his doctor today who is worried about his head injury and being on Eliquis  and sent him to the ED for evaluation.   Head Injury Facial Injury      Prior to Admission medications   Medication Sig Start Date End Date Taking? Authorizing Provider  allopurinol  (ZYLOPRIM ) 300 MG tablet TAKE 2 TABLETS (600 MG TOTAL) BY MOUTH DAILY. 04/22/24   Tabori, Katherine E, MD  amiodarone  (PACERONE ) 200 MG tablet Take 0.5 tablets (100 mg total) by mouth daily. 03/13/24   Bensimhon, Toribio SAUNDERS, MD  aspirin  EC 81 MG tablet Take 81 mg by mouth daily. Swallow whole.    [provider]  atorvastatin  (LIPITOR) 80 MG tablet TAKE 1 TABLET BY MOUTH DAILY. 08/24/24   Milford, Harlene HERO, FNP  calcium  carbonate (TUMS - DOSED IN MG ELEMENTAL CALCIUM ) 500 MG chewable tablet Chew 2-3 tablets by mouth daily as needed for indigestion or heartburn.    [provider]  carvedilol  (COREG ) 3.125 MG tablet TAKE 1 TABLET BY MOUTH 2 TIMES DAILY. 12/02/23   Bensimhon, Toribio SAUNDERS, MD  cetirizine (ZYRTEC) 10 MG tablet Take 10 mg  by mouth daily.    [provider]  cyclobenzaprine  (FLEXERIL ) 10 MG tablet TAKE 1 TABLET BY MOUTH 3 TIMES DAILY AS NEEDED FOR MUSCLE SPASMS. 07/16/24   Tabori, Katherine E, MD  ELIQUIS  5 MG TABS tablet TAKE 1 TABLET BY MOUTH 2 TIMES A DAY 10/09/23   Bensimhon, Toribio SAUNDERS, MD  Evolocumab  (REPATHA  SURECLICK) 140 MG/ML SOAJ INJECT 140 MG INTO THE SKIN EVERY 14 DAYS. 04/30/24   Bensimhon, Toribio SAUNDERS, MD  FARXIGA  10 MG TABS tablet TAKE 1 TABLET BY MOUTH DAILY BEFORE BREAKFAST. 02/11/24   Tabori, Katherine E, MD  fenofibrate  160 MG tablet TAKE 1 TABLET BY MOUTH DAILY. 05/21/24   Tabori, Katherine E, MD  furosemide  (LASIX ) 20 MG tablet Take 1 tablet by mouth daily as needed for swelling or a weight gain of 3 pounds or more in 24 hours or 5 pounds in one week. 07/26/23   Bensimhon, Toribio SAUNDERS, MD  magnesium  oxide (MAG-OX) 400 MG tablet Take 1 tab by mouth Twice daily 12/30/23   McLean, Dalton S, MD  methocarbamol  (ROBAXIN ) 500 MG tablet Take 1 tablet (500 mg total) by mouth every 6 (six) hours as needed for muscle spasms. 08/25/24   Tabori, Katherine E, MD  mexiletine (MEXITIL ) 150 MG capsule TAKE 2 CAPSULES BY MOUTH 2 TIMES DAILY. 07/01/24  Bensimhon, Daniel R, MD  Naphazoline HCl (CLEAR EYES OP) Place 1 drop into both eyes daily as needed (sore eyes).    [provider]  pantoprazole  (PROTONIX ) 40 MG tablet TAKE 1 TABLET BY MOUTH DAILY. 08/24/24   Tabori, Katherine E, MD  potassium chloride  SA (KLOR-CON  M20) 20 MEQ tablet Take 1 tablet by mouth when taking Lasix  07/26/23   Bensimhon, Daniel R, MD  sacubitril -valsartan  (ENTRESTO ) 49-51 MG TAKE 1 TABLET BY MOUTH 2 TIMES DAILY. 07/29/24   Bensimhon, Toribio SAUNDERS, MD  Semaglutide , 2 MG/DOSE, (OZEMPIC , 2 MG/DOSE,) 8 MG/3ML SOPN Inject 2 mg into the skin once a week. 09/18/23   Bensimhon, Toribio SAUNDERS, MD  sodium chloride  (OCEAN) 0.65 % SOLN nasal spray Place 1 spray into both nostrils as needed (dryness).    [provider]  spironolactone  (ALDACTONE ) 25 MG  tablet TAKE 1 TABLET (25 MG TOTAL) BY MOUTH AT BEDTIME. 03/10/24   Bensimhon, Toribio SAUNDERS, MD  traZODone  (DESYREL ) 100 MG tablet TAKE 1 TABLET (100 MG TOTAL) BY MOUTH AT BEDTIME. 05/21/24   Tabori, Katherine E, MD  triamcinolone  (KENALOG ) 0.025 % ointment Apply 1 Application topically daily as needed (Dry skin).    [provider]  VASCEPA  1 g capsule TAKE 2 CAPSULES BY MOUTH 2 TIMES DAILY. 07/16/24   Bensimhon, Toribio SAUNDERS, MD  Semaglutide ,0.25 or 0.5MG /DOS, (OZEMPIC , 0.25 OR 0.5 MG/DOSE,) 2 MG/1.5ML SOPN Inject 0.5 mg into the skin once a week. 07/22/23   Bensimhon, Toribio SAUNDERS, MD    Allergies: Patient has no known allergies.    Review of Systems  Updated Vital Signs BP 110/79   Pulse 83   Temp 98 F (36.7 C) (Oral)   Resp 16   SpO2 99%   Physical Exam Vitals and nursing note reviewed.  Constitutional:      Appearance: He is well-developed.  HENT:     Head: Normocephalic.     Comments: Some bruising underneath the left eye.  Extraocular motion intact. Eyes:     Pupils: Pupils are equal, round, and reactive to light.  Neck:     Vascular: No JVD.  Cardiovascular:     Rate and Rhythm: Normal rate and regular rhythm.     Heart sounds: No murmur heard.    No friction rub. No gallop.  Pulmonary:     Effort: No respiratory distress.     Breath sounds: No wheezing.  Abdominal:     General: There is no distension.     Tenderness: There is no abdominal tenderness. There is no guarding or rebound.  Musculoskeletal:        General: Normal range of motion.     Cervical back: Normal range of motion and neck supple.  Skin:    Coloration: Skin is not pale.     Findings: No rash.  Neurological:     Mental Status: He is alert and oriented to person, place, and time.  Psychiatric:        Behavior: Behavior normal.     (all labs ordered are listed, but only abnormal results are displayed) Labs Reviewed  BASIC METABOLIC PANEL WITH GFR - Abnormal; Notable for the following components:       Result Value   Glucose, Bld 119 (*)    Creatinine, Ser 1.32 (*)    All other components within normal limits  TROPONIN I (HIGH SENSITIVITY) - Abnormal; Notable for the following components:   Troponin I (High Sensitivity) 31 (*)    All other components within  normal limits  CBC WITH DIFFERENTIAL/PLATELET  TSH  MAGNESIUM     EKG: EKG Interpretation Date/Time:  Monday August 31 2024 17:10:08 EDT Ventricular Rate:  90 PR Interval:  238 QRS Duration:  110 QT Interval:  392 QTC Calculation: 479 R Axis:   -78  Text Interpretation: Sinus rhythm with 1st degree A-V block with occasional and consecutive Premature ventricular complexes Left anterior fascicular block Septal infarct , age undetermined Lateral infarct , age undetermined Abnormal ECG Premature ventricular complexes Otherwise no significant change Confirmed by Emil Share [phone]) on 08/31/2024 7:15:03 PM  Radiology: CT Maxillofacial Wo Contrast Result Date: 08/31/2024 EXAM: CT OF THE FACE WITHOUT CONTRAST 08/31/2024 05:38:49 PM TECHNIQUE: CT of the face was performed without the administration of intravenous contrast. Multiplanar reformatted images are provided for review. Automated exposure control, iterative reconstruction, and/or weight based adjustment of the mA/kV was utilized to reduce the radiation dose to as low as reasonably achievable. COMPARISON: None available. CLINICAL HISTORY: Fall. Fall FINDINGS: FACIAL BONES: No acute facial fracture. No mandibular dislocation. No suspicious bone lesion. ORBITS: Globes are intact. No acute traumatic injury. No inflammatory change. SINUSES AND MASTOIDS: No acute abnormality. SOFT TISSUES: Left periorbital / facial contusion. IMPRESSION: 1. No acute facial fracture. 2. Left periorbital/facial soft tissue contusion. Electronically signed by: Norman Gatlin MD 08/31/2024 05:53 PM EDT RP Workstation: [id number]   CT Head Wo Contrast Result Date: 08/31/2024 EXAM: CT HEAD WITHOUT  CONTRAST 08/31/2024 05:38:49 PM TECHNIQUE: CT of the head was performed without the administration of intravenous contrast. Automated exposure control, iterative reconstruction, and/or weight based adjustment of the mA/kV was utilized to reduce the radiation dose to as low as reasonably achievable. COMPARISON: MRI head 9 / 14 / 21 CLINICAL HISTORY: fall on Eliquis  FINDINGS: BRAIN AND VENTRICLES: No acute hemorrhage. No evidence of acute infarct. No hydrocephalus. No extra-axial collection. No mass effect or midline shift. ORBITS: No acute abnormality. SINUSES: No acute abnormality. SOFT TISSUES AND SKULL: No acute soft tissue abnormality. No skull fracture. IMPRESSION: 1. No acute intracranial abnormality. Electronically signed by: Norman Gatlin MD 08/31/2024 05:50 PM EDT RP Workstation: [id number]     Procedures   Medications Ordered in the ED  magnesium  oxide (MAG-OX) tablet 800 mg (800 mg Oral Given 08/31/24 2041)                                    Medical Decision Making Amount and/or Complexity of Data Reviewed Labs: ordered.  Risk OTC drugs.   58 yo M with a chief complaints of a fall.  Patient actually went into V. tach [place]  He was shocked twice by his implanted defibrillator.  He felt better and went home and has been doing okay for him for the last few days.  He saw his family doctor today and they encouraged him to come to the ED for CT imaging of his head as he is on Eliquis  and struck his head when he fell.  CT of the head without obvious acute intracranial pathology.  CT of the face without obvious facial fracture.  Will add on mag level add-on troponin.  Will discuss with cardiology.  I discussed case with Dr. Gail, cardiology evaluated the patient at bedside.  Interrogated the pacemaker at bedside.  Does feel reasonable to let the patient go home with close follow-up tomorrow.  Encouraged him to return at any time he felt  worse or  would like to be reevaluated.  9:09 PM:  I have discussed the diagnosis/risks/treatment options with the patient and family.  Evaluation and diagnostic testing in the emergency department does not suggest an emergent condition requiring admission or immediate intervention beyond what has been performed at this time.  They will follow up with Cards. We also discussed returning to the ED immediately if new or worsening sx occur. We discussed the sx which are most concerning (e.g., sudden worsening pain, fever, inability to tolerate by mouth) that necessitate immediate return. Medications administered to the patient during their visit and any new prescriptions provided to the patient are listed below.  Medications given during this visit Medications  magnesium  oxide (MAG-OX) tablet 800 mg (800 mg Oral Given 08/31/24 2041)     The patient appears reasonably screen and/or stabilized for discharge and I doubt any other medical condition or other [facility] requiring further screening, evaluation, or treatment in the ED at this time prior to discharge.      Final diagnoses:  Ventricular fibrillation [facility])  Injury of head, initial encounter    ED Discharge Orders     None          Emil Share, DO 08/31/24 2110

## 2024-09-01 ENCOUNTER — Encounter: Payer: Self-pay | Admitting: Pulmonary Disease

## 2024-09-01 ENCOUNTER — Ambulatory Visit: Attending: Pulmonary Disease | Admitting: Pulmonary Disease

## 2024-09-01 VITALS — BP 95/60 | HR 76 | Ht 74.0 in | Wt 220.0 lb

## 2024-09-01 DIAGNOSIS — G459 Transient cerebral ischemic attack, unspecified: Secondary | ICD-10-CM

## 2024-09-01 DIAGNOSIS — I5042 Chronic combined systolic (congestive) and diastolic (congestive) heart failure: Secondary | ICD-10-CM

## 2024-09-01 DIAGNOSIS — I4819 Other persistent atrial fibrillation: Secondary | ICD-10-CM

## 2024-09-01 DIAGNOSIS — I255 Ischemic cardiomyopathy: Secondary | ICD-10-CM | POA: Diagnosis not present

## 2024-09-01 DIAGNOSIS — I472 Ventricular tachycardia, unspecified: Secondary | ICD-10-CM

## 2024-09-01 DIAGNOSIS — I2510 Atherosclerotic heart disease of native coronary artery without angina pectoris: Secondary | ICD-10-CM

## 2024-09-01 DIAGNOSIS — Z95810 Presence of automatic (implantable) cardiac defibrillator: Secondary | ICD-10-CM | POA: Diagnosis not present

## 2024-09-01 DIAGNOSIS — D6869 Other thrombophilia: Secondary | ICD-10-CM

## 2024-09-01 LAB — CBC WITH DIFFERENTIAL/PLATELET
Basophils Absolute: 0 K/uL (ref 0.0–0.1)
Basophils Relative: 0.3 % (ref 0.0–3.0)
Eosinophils Absolute: 0.2 K/uL (ref 0.0–0.7)
Eosinophils Relative: 2 % (ref 0.0–5.0)
HCT: 44.4 % (ref 39.0–52.0)
Hemoglobin: 15 g/dL (ref 13.0–17.0)
Lymphocytes Relative: 20.9 % (ref 12.0–46.0)
Lymphs Abs: 1.9 K/uL (ref 0.7–4.0)
MCHC: 33.7 g/dL (ref 30.0–36.0)
MCV: 94.4 fl (ref 78.0–100.0)
Monocytes Absolute: 0.8 K/uL (ref 0.1–1.0)
Monocytes Relative: 8.2 % (ref 3.0–12.0)
Neutro Abs: 6.3 K/uL (ref 1.4–7.7)
Neutrophils Relative %: 68.6 % (ref 43.0–77.0)
Platelets: 227 K/uL (ref 150.0–400.0)
RBC: 4.7 Mil/uL (ref 4.22–5.81)
RDW: 13.7 % (ref 11.5–15.5)
WBC: 9.1 K/uL (ref 4.0–10.5)

## 2024-09-01 LAB — CUP PACEART INCLINIC DEVICE CHECK
Date Time Interrogation Session: 20251021132007
Implantable Lead Connection Status: 753985
Implantable Lead Connection Status: 753985
Implantable Lead Implant Date: 20211011
Implantable Lead Implant Date: 20211011
Implantable Lead Location: 753859
Implantable Lead Location: 753860
Implantable Lead Model: 138
Implantable Lead Model: 7841
Implantable Lead Serial Number: 1097338
Implantable Lead Serial Number: 303261
Implantable Pulse Generator Implant Date: 20211011
Pulse Gen Serial Number: 228262

## 2024-09-01 LAB — COMPREHENSIVE METABOLIC PANEL WITH GFR
ALT: 21 U/L (ref 0–53)
AST: 16 U/L (ref 0–37)
Albumin: 4.6 g/dL (ref 3.5–5.2)
Alkaline Phosphatase: 28 U/L — ABNORMAL LOW (ref 39–117)
BUN: 19 mg/dL (ref 6–23)
CO2: 26 meq/L (ref 19–32)
Calcium: 9.1 mg/dL (ref 8.4–10.5)
Chloride: 103 meq/L (ref 96–112)
Creatinine, Ser: 1.34 mg/dL (ref 0.40–1.50)
GFR: 57.84 mL/min — ABNORMAL LOW (ref >60.00)
Glucose, Bld: 136 mg/dL — ABNORMAL HIGH (ref 70–99)
Potassium: 4 meq/L (ref 3.5–5.1)
Sodium: 137 meq/L (ref 135–145)
Total Bilirubin: 0.5 mg/dL (ref 0.2–1.2)
Total Protein: 7.1 g/dL (ref 6.0–8.3)

## 2024-09-01 LAB — MAGNESIUM: Magnesium: 1.8 mg/dL (ref 1.5–2.5)

## 2024-09-01 NOTE — Patient Instructions (Signed)
 Medication Instructions:  Take a dose of your potassium today when you get home. *If you need a refill on your cardiac medications before your next appointment, please call your pharmacy*  Lab Work: None ordered If you have labs (blood work) drawn today and your tests are completely normal, you will receive your results only by: MyChart Message (if you have MyChart) OR A paper copy in the mail If you have any lab test that is abnormal or we need to change your treatment, we will call you to review the results.  Follow-Up: At [facility], you and your health needs are our priority.  As part of our continuing mission to provide you with exceptional heart care, our providers are all part of one team.  This team includes your primary Cardiologist (physician) and Advanced Practice Providers or APPs (Physician Assistants and Nurse Practitioners) who all work together to provide you with the care you need, when you need it.  Your next appointment:   1 year(s)  Provider:   Fonda Kitty, MD    Other Instructions Be sure to stay hydrated and be sure that you are eating as discussed.

## 2024-09-03 ENCOUNTER — Encounter: Payer: Self-pay | Admitting: Internal Medicine

## 2024-09-07 ENCOUNTER — Ambulatory Visit: Payer: Self-pay | Admitting: Cardiology

## 2024-09-11 ENCOUNTER — Telehealth: Payer: Self-pay | Admitting: Internal Medicine

## 2024-09-11 NOTE — Telephone Encounter (Signed)
 Called to confirm/remind patient of their appointment at the [facility] on 09/14/24.   Appointment:   [x] Confirmed  [] Left mess   [] No answer/No voice mail  [] VM Full/unable to leave message  [] Phone not in service  Patient reminded to bring all medications and/or complete list.  Confirmed patient has transportation. Gave directions, instructed to utilize valet parking.

## 2024-09-14 ENCOUNTER — Ambulatory Visit: Attending: Internal Medicine | Admitting: Internal Medicine

## 2024-09-14 ENCOUNTER — Encounter: Payer: Self-pay | Admitting: Internal Medicine

## 2024-09-14 VITALS — BP 102/64 | HR 81 | Ht 74.0 in | Wt 219.4 lb

## 2024-09-14 DIAGNOSIS — Z95810 Presence of automatic (implantable) cardiac defibrillator: Secondary | ICD-10-CM | POA: Diagnosis not present

## 2024-09-14 DIAGNOSIS — I5042 Chronic combined systolic (congestive) and diastolic (congestive) heart failure: Secondary | ICD-10-CM | POA: Diagnosis not present

## 2024-09-14 DIAGNOSIS — Z79890 Hormone replacement therapy (postmenopausal): Secondary | ICD-10-CM | POA: Insufficient documentation

## 2024-09-14 DIAGNOSIS — I428 Other cardiomyopathies: Secondary | ICD-10-CM | POA: Insufficient documentation

## 2024-09-14 DIAGNOSIS — Z7982 Long term (current) use of aspirin: Secondary | ICD-10-CM | POA: Diagnosis not present

## 2024-09-14 DIAGNOSIS — E119 Type 2 diabetes mellitus without complications: Secondary | ICD-10-CM | POA: Insufficient documentation

## 2024-09-14 DIAGNOSIS — E039 Hypothyroidism, unspecified: Secondary | ICD-10-CM | POA: Insufficient documentation

## 2024-09-14 DIAGNOSIS — E669 Obesity, unspecified: Secondary | ICD-10-CM | POA: Diagnosis not present

## 2024-09-14 DIAGNOSIS — I110 Hypertensive heart disease with heart failure: Secondary | ICD-10-CM | POA: Insufficient documentation

## 2024-09-14 DIAGNOSIS — Z7984 Long term (current) use of oral hypoglycemic drugs: Secondary | ICD-10-CM | POA: Diagnosis not present

## 2024-09-14 DIAGNOSIS — Z6828 Body mass index (BMI) 28.0-28.9, adult: Secondary | ICD-10-CM | POA: Diagnosis not present

## 2024-09-14 DIAGNOSIS — I5022 Chronic systolic (congestive) heart failure: Secondary | ICD-10-CM | POA: Insufficient documentation

## 2024-09-14 DIAGNOSIS — I2510 Atherosclerotic heart disease of native coronary artery without angina pectoris: Secondary | ICD-10-CM | POA: Diagnosis not present

## 2024-09-14 DIAGNOSIS — Z87891 Personal history of nicotine dependence: Secondary | ICD-10-CM | POA: Insufficient documentation

## 2024-09-14 DIAGNOSIS — Z79899 Other long term (current) drug therapy: Secondary | ICD-10-CM | POA: Diagnosis not present

## 2024-09-14 DIAGNOSIS — I4819 Other persistent atrial fibrillation: Secondary | ICD-10-CM | POA: Diagnosis not present

## 2024-09-14 DIAGNOSIS — R0609 Other forms of dyspnea: Secondary | ICD-10-CM | POA: Insufficient documentation

## 2024-09-14 DIAGNOSIS — I472 Ventricular tachycardia, unspecified: Secondary | ICD-10-CM | POA: Diagnosis not present

## 2024-09-14 DIAGNOSIS — Z7985 Long-term (current) use of injectable non-insulin antidiabetic drugs: Secondary | ICD-10-CM | POA: Insufficient documentation

## 2024-09-14 DIAGNOSIS — Z7901 Long term (current) use of anticoagulants: Secondary | ICD-10-CM | POA: Diagnosis not present

## 2024-09-14 MED ORDER — AMIODARONE HCL 200 MG PO TABS: 200.0000 mg | ORAL_TABLET | Freq: Every day | ORAL | 3 refills | Status: AC

## 2024-09-14 NOTE — Patient Instructions (Addendum)
 Medication Changes:  INCREASE Amiodarone  200mg  (1 tab) daily  Lab Work:  Go downstairs to [facility] on [address] to have your blood work completed.  We will only call you if the results are abnormal or if the provider would like to make medication changes.  No news is good news.     Testing/Procedures:  You are scheduled for a Cardiopulmonary Exercise (CPX) Test as [facility] on: Date: 09/29/24   Time: 1:30   Expect to be in the lab for 2 hours. Please plan to arrive 30 minutes prior to your appointment. You may be asked to reschedule your test if you arrive 20 minutes or more after your scheduled appointment time.  Main Campus address: [address] [place], [place] [address] You may arrive to the Main Entrance A or Entrance C (free valet parking is available at both). -Main Entrance A (on [address]) :proceed to admitting for check in -Entrance C (on [employer]): proceed to [employer] parking or under hospital deck parking using this code ___1520__  Check In: [facility] waiting room (1st floor)   General Instructions for the day of the test (Please follow all instructions from your physician): Refrain from ingesting a heavy meal, alcohol, or caffeine or using tobacco products within 2 hours of the test (DO NOT FAST for mare than 8 hours). You may have all other non-alcoholic, non -caffeinated beverage,a light snack (crackers,a piece of fruit, carrot sticks, toast bagel,etc) up to your appointment. Avoid significant exertion or exercise within 24 hours of your test. Be prepared to exercise and sweat. Your clothing should permit freedom of movement and include walking or running shoes. Women bring loose fitting short sleeved blouse.  This evaluation may be fatiguing and you may wish ti have someone accompany you to the assessment to drive you home afterward. Bring a list of your medications with you, including dosage and frequency you take the medications (   I.e.,once per day, twice per day, etc). Take all medications as prescribed, unless noted below or instructed to do so by your physician.  Please do not take the following medications prior to your CPX:  _________furosemide, farxiga , or Spironolactone____  ____________________________  Brief description of the test: A brief lung test will be performed. This will involve you taking deep breaths and blowing hard and fast through your mouth. During these , a clip will be on your nose and you will be breathing through a breathing device.   For the exercise portion of the test you will be walking on a treadmill, or riding a stationary bike, to your maximal effor or until symptoms such as chest pain, shortness of breath, leg pain or dizziness limit your exercise. You will be breathing in and out of a breathing device through your mouth (a clip will be on your nose again). Your heart rate, ECG, blood pressure, oxygen saturations, breathing rate and depth, amount of oxygen you consume and amount of carbon dioxide you produce will be measured and monitored throughout the exercise test.  If you need to cancel or reschedule your appointment please call [phone] If you have further questions please call your physician or Damien Nunnery at [phone]    Follow-Up in: Please follow up with the [facility] in 2 months with Dr. Bensimhon.   Thank you for choosing [facility].    At the [facility], you and your health needs are our priority. We  have a designated team specialized in the treatment of Heart Failure. This Care Team includes your primary Heart Failure Specialized Cardiologist (physician), Advanced Practice Providers (APPs- Physician Assistants and Nurse Practitioners), and Pharmacist who all work together to provide you with the care you need, when you need it.   You may see any of the following providers on your  designated Care Team at your next follow up:  Dr. Toribio Fuel Dr. Ezra Shuck Dr. Ria Commander Dr. Morene Brownie Ellouise Class, FNP Jaun Bash, RPH-CPP  Please be sure to bring in all your medications bottles to every appointment.   Need to Contact Us :  If you have any questions or concerns before your next appointment please send us  a message through [place] or call our office at [phone].    TO LEAVE A MESSAGE FOR THE NURSE SELECT OPTION 2, PLEASE LEAVE A MESSAGE INCLUDING: YOUR NAME DATE OF BIRTH CALL BACK NUMBER REASON FOR CALL**this is important as we prioritize the call backs  YOU WILL RECEIVE A CALL BACK THE SAME DAY AS LONG AS YOU CALL BEFORE 4:00 PM

## 2024-09-14 NOTE — Progress Notes (Signed)
 ADVANCED HF CLINIC NOTE PCP: Jason Floyd BRAVO, MD EP: Dr. Fernande HF: Dr. Cherrie  CC:    HPI: [name] is a 58 y.o. with a PMH of severe systolic heart failure due to NICM, DM2, former polysubstance and tobacco abuse.    Echo 2008 EF 15% in setting of polysubstance abuse.   Echo 12/2007 EF 55-60%  Echo 10/20 EF 55-60% no RWMA.   On 06/07/20 had TIA-like symptoms with mild aphasia and weakness (dropped a can with left hand). Went to PCP had carotid u/s and echo. Carotid u/s 06/22/20 1-39% bilaterally. Echo 8/21 EF down to 25-30%. Brain MRI/MRA. Normal vasculature.  Left corona radiata diffusion-weighted/FLAIR hyperintensity is nonspecific. Differential includes active demyelinating lesionversus subacute insult.  Underwent R/L cath 07/01/20 LAD 100% prox LCX 20% RCA 10%  EF 25-30%  After cath underwent cMRI for viability  - LVEF 26% - LGE suggestive of a very large, wrap-around LAD infarction with extensive no-reflow affecting the subendocardium.   Zio monitored placed for palpitations in 10/21 and found to have VT. Underwent ICD placement by Dr. Fernande on 08/22/20. Was also found to have volume overload and AF.  Echo 05/29/23 EF 35-40%   Admitted 07/16/23 with ICD shocks found to have AF that degenerated to VT. Started amio. Underwent TEE/DC-CV for AF. Post DC-CV complicated by transient hypotension. TEE EF 25%  Echo 10/24 EF 30-35% (probably closer to 35%).   S/p AF ablation 11/24.  Was in the ED 08/31/24 after a fall at the [facility]. Felt like he may have been dehydrated and he had an episode after he got home, he vomited  and collapsed and hit his head. He was notified by his remote transmission of his defibrillator that he had gone into V. tach at that moment and was shocked twice. Head CT negative. Magesium was replaced for a level of 1.8. Potassium was 3.9.   Today he returns for HF follow up with a chief complaint of minimal fatigue.   Walking 18 minutes on  treadmill and lifting weights daily.   On 09/11/24, briefly felt like he had a couple of episodes of VT. Drank a bunch of water and felt better.   Saw EP 09/01/24 after his event at the [facility]. Was told to take 20meq potassium that day.    Has upcoming echo 09/17/24.  Past Medical History:  Diagnosis Date   AICD (automatic cardioverter/defibrillator) present    Allergy    Arthritis    Atrial fibrillation (HCC)    CHF (congestive heart failure) (HCC)    CHF (congestive heart failure) (HCC)    Diabetes mellitus without complication (HCC)    Drug abuse (HCC)    Eczema    GERD (gastroesophageal reflux disease)    Gout    Heart attack (HCC) 06/10/2020   History of kidney stones    Hyperlipidemia    Hypertension    MI (myocardial infarction) (HCC)    PE (pulmonary embolism)    TIA (transient ischemic attack)    hx of per pt - pt not aware he had not followed by a neurologist   Ventricular tachycardia (HCC)    Current Outpatient Medications  Medication Sig Dispense Refill   ELIQUIS  5 MG TABS tablet TAKE 1 TABLET BY MOUTH 2 TIMES A DAY 60 tablet 11   furosemide  (LASIX ) 20 MG tablet Take 1 tablet by mouth daily as needed for swelling or a weight gain of 3 pounds or more in 24 hours or 5  pounds in one week. 30 tablet 5   pantoprazole  (PROTONIX ) 40 MG tablet TAKE 1 TABLET BY MOUTH DAILY. 30 tablet 3   potassium chloride  SA (KLOR-CON  M20) 20 MEQ tablet Take 1 tablet by mouth when taking Lasix  30 tablet 5   sacubitril -valsartan  (ENTRESTO ) 49-51 MG TAKE 1 TABLET BY MOUTH 2 TIMES DAILY. 60 tablet 3   Semaglutide , 2 MG/DOSE, (OZEMPIC , 2 MG/DOSE,) 8 MG/3ML SOPN Inject 2 mg into the skin once a week. 3 mL 11   sodium chloride  (OCEAN) 0.65 % SOLN nasal spray Place 1 spray into both nostrils as needed (dryness).     spironolactone  (ALDACTONE ) 25 MG tablet TAKE 1 TABLET (25 MG TOTAL) BY MOUTH AT BEDTIME. 30 tablet 7   traZODone  (DESYREL ) 100 MG tablet TAKE 1 TABLET (100 MG TOTAL) BY MOUTH AT  BEDTIME. 90 tablet 1   triamcinolone  (KENALOG ) 0.025 % ointment Apply 1 Application topically daily as needed (Dry skin).     VASCEPA  1 g capsule TAKE 2 CAPSULES BY MOUTH 2 TIMES DAILY. 120 capsule 4   allopurinol  (ZYLOPRIM ) 300 MG tablet TAKE 2 TABLETS (600 MG TOTAL) BY MOUTH DAILY. 60 tablet 6   amiodarone  (PACERONE ) 200 MG tablet Take 0.5 tablets (100 mg total) by mouth daily.     aspirin  EC 81 MG tablet Take 81 mg by mouth daily. Swallow whole.     atorvastatin  (LIPITOR) 80 MG tablet TAKE 1 TABLET BY MOUTH DAILY. 30 tablet 11   calcium  carbonate (TUMS - DOSED IN MG ELEMENTAL CALCIUM ) 500 MG chewable tablet Chew 2-3 tablets by mouth daily as needed for indigestion or heartburn.     carvedilol  (COREG ) 3.125 MG tablet TAKE 1 TABLET BY MOUTH 2 TIMES DAILY. 180 tablet 3   cetirizine (ZYRTEC) 10 MG tablet Take 10 mg by mouth daily.     cyclobenzaprine  (FLEXERIL ) 10 MG tablet TAKE 1 TABLET BY MOUTH 3 TIMES DAILY AS NEEDED FOR MUSCLE SPASMS. 45 tablet 2   Evolocumab  (REPATHA  SURECLICK) 140 MG/ML SOAJ INJECT 140 MG INTO THE SKIN EVERY 14 DAYS. 6 mL 1   FARXIGA  10 MG TABS tablet TAKE 1 TABLET BY MOUTH DAILY BEFORE BREAKFAST. 90 tablet 2   fenofibrate  160 MG tablet TAKE 1 TABLET BY MOUTH DAILY. 90 tablet 1   magnesium  oxide (MAG-OX) 400 MG tablet Take 1 tab by mouth Twice daily 60 tablet 12   methocarbamol  (ROBAXIN ) 500 MG tablet Take 1 tablet (500 mg total) by mouth every 6 (six) hours as needed for muscle spasms. 30 tablet 3   mexiletine (MEXITIL ) 150 MG capsule TAKE 2 CAPSULES BY MOUTH 2 TIMES DAILY. 120 capsule 11   Naphazoline HCl (CLEAR EYES OP) Place 1 drop into both eyes daily as needed (sore eyes).     No current facility-administered medications for this visit.   No Known Allergies   Social History   Socioeconomic History   Marital status: Married    Spouse name: [name]   Number of children: Not on file   Years of education: Not on file   Highest education level: Bachelor's degree  (e.g., BA, AB, BS)  Occupational History   Not on file  Tobacco Use   Smoking status: Former    Current packs/day: 0.00    Average packs/day: 0.3 packs/day for 31.0 years (7.8 ttl pk-yrs)    Types: Cigarettes    Quit date: 07/15/2023    Years since quitting: 1.1   Smokeless tobacco: Never   Tobacco comments:    Former smoker  11/01/23  Vaping Use   Vaping status: Never Used  Substance and Sexual Activity   Alcohol use: Yes    Comment: once a month   Drug use: Not Currently    Types: Marijuana    Comment: gummies every days   Sexual activity: Not on file  Other Topics Concern   Not on file  Social History Narrative   Lives with wife   Caffeine- coffee 4 c daily   Social Drivers of Health   Financial Resource Strain: Low Risk  (08/31/2024)   Overall Financial Resource Strain (CARDIA)    Difficulty of Paying Living Expenses: Not hard at all  Food Insecurity: No Food Insecurity (08/31/2024)   Hunger Vital Sign    Worried About Running Out of Food in the Last Year: Never true    Ran Out of Food in the Last Year: Never true  Transportation Needs: No Transportation Needs (08/31/2024)   PRAPARE - Administrator, Civil Service (Medical): No    Lack of Transportation (Non-Medical): No  Physical Activity: Insufficiently Active (08/31/2024)   Exercise Vital Sign    Days of Exercise per Week: 3 days    Minutes of Exercise per Session: 30 min  Stress: No Stress Concern Present (08/31/2024)   [employer] of Occupational Health - Occupational Stress Questionnaire    Feeling of Stress: Only a little  Social Connections: Unknown (08/31/2024)   Social Connection and Isolation Panel    Frequency of Communication with Friends and Family: Patient declined    Frequency of Social Gatherings with Friends and Family: Patient declined    Attends Religious Services: Never    Database Administrator or Organizations: No    Attends Engineer, Structural: Not on file     Marital Status: Married  Catering Manager Violence: Not At Risk (07/17/2023)   Humiliation, Afraid, Rape, and Kick questionnaire    Fear of Current or Ex-Partner: No    Emotionally Abused: No    Physically Abused: No    Sexually Abused: No    Family History  Problem Relation Age of Onset   Dementia Mother        frontal-temporal   Stroke Mother    Breast cancer Maternal Aunt    Other Brother        overdose   Colon cancer Neg Hx    Vitals:   09/14/24 1333  BP: 102/64  Pulse: 81  SpO2: 99%  Weight: 219 lb 6 oz (99.5 kg)  Height: 6' 2 (1.88 m)   Wt Readings from Last 3 Encounters:  09/14/24 219 lb 6 oz (99.5 kg)  09/01/24 220 lb (99.8 kg)  08/31/24 215 lb (97.5 kg)   Lab Results  Component Value Date   CREATININE 1.32 (H) 08/31/2024   CREATININE 1.34 08/31/2024   CREATININE 1.57 (H) 03/13/2024     PHYSICAL EXAM: General:  Well appearing. No resp difficulty HEENT: normal Neck: supple. no JVD. Carotids 2+ bilat; no bruits. No lymphadenopathy or thryomegaly appreciated. Cor: PMI nondisplaced. Regular rate & rhythm. No rubs, gallops or murmurs. Lungs: clear Abdomen: soft, nontender, nondistended. No hepatosplenomegaly. No bruits or masses. Good bowel sounds. Extremities: no cyanosis, clubbing, rash, edema Neuro: alert & orientedx3, cranial nerves grossly intact. moves all 4 extremities w/o difficulty. Affect pleasant  ICD interrogated (personally reviewed): HL Score 0 Volume ok No VT/AF Activity level 1.3hr/day   ASSESSMENT & PLAN:  Chronic Systolic HF due to iCM - Echo 7991 EF 15% in  setting of polysubstance abuse.  - Echo 2/09 EF 55-60% - Echo 10/20 EF 55-60% no RWMA.  - Echo 8/21 EF 25-30% - Cath 8/21 LAD 100% LCx 20% RCA 10% - cMRI 9/21 EF 26% Large anterior infarct - BosSCi ICD placed 10/21 for VT - ICD interrogation. HL score 11. No VT/AF. Fluid ok AL 5.1 h/day Personally reviewed - Echo 12/21 EF 30-35% - Echo 6/22 EF 40-45%  - Echo 6/23 EF 40-45% (read  as 35-40%)  - Echo 7/24 EF 35-40%  - TEE 9/24 EF 25% in setting of AF/VT - Echo 10/24 EF 30-35% (probably closer to 35%).  - NYHA I.  - Continue carvedilol  3.125 mg bid - Continue Farxiga  10 mg daily. - Continue Entresto  49/51 bid. - Continue spiro 25 mg daily. - Lasix  PRN with 20meq potassium PRN - BP too low to titrate GDMT further. If SBP drops into 80s or becomes symptomatic can drop Entresto  to 24/26 bid   2. CAD - cath 8/21 subacute LAD infarct and CTO LAD - No s/s angina - Continue ASA, statin.  - Followed by Lipid clinic - LDL 2/25 was 28  3. VT - s/p ICD 10/21 - s/p ICD shock in 9/24 and again 10/25.  - Felt to be related to LAD scar  - Continue magnesium  400mg  BID - Continue mexilitene 150 bid and amio 100 daily.  - Also considering possible VT ablation at [facility] but would like to manage medically if possible. D/w EP last month (note from 04/08/24 reviewed). Would like to get him off amio eventually.  - We discussed amio wean to 100 every other day but will hold for now. If no VT/AF in 3months will reconsider - Keep K. 4.0 Mg > 2.0  4. Persistent AF - s/p AF ablation 11/24. - Continue Eliquis  5 mg bid.  - No bleeding  5. Tobacco use - Has been quit since 9/204. - No change  6. DM2 - Continue SGLT2i, metformin , GLP1-RA  7. Hx Possible TIA - Carotid dopplers ok - Brain MRI/MRA. Vasculature ok. Findings suspicious for cardio-embolism.   8. Hypothyroidism - Continue synthroid  - Per PCP  9. Obesity - Body mass index is 28.17 kg/m. - Weight is down - Continue GLP1-RA  Ellouise Class, NP  Patient seen and examined with the above-signed Advanced Practice Provider and/or Housestaff. I personally reviewed laboratory data, imaging studies and relevant notes. I independently examined the patient and formulated the important aspects of the plan. I have edited the note to reflect any of my changes or salient points. I have personally discussed the plan with the  patient and/or family.  Had ICD shock on 08/28/24. Seen by EP felt to be due to dehydration and low mag. Amio left at 100 daily.   Feeling better lately. Almost back to baseline. Exercising several times per weak. No CP. Mild DOE. Had recurrent NSVT on 10/31 x 2. No shock   General:  Sitting in chair. No resp difficulty HEENT: normal Neck: supple. no JVD.  Cor: Regular rate & rhythm. No rubs, gallops or murmurs. Lungs: clear Abdomen: soft, nontender, nondistended.Good bowel sounds. Extremities: no cyanosis, clubbing, rash, edema Neuro: alert & orientedx3, cranial nerves grossly intact. moves all 4 extremities w/o difficulty. Affect pleasant  ICD interrogated personally: NSVT x 2 on 10/31. HL score 0 Volume ok. No AF.  AL 1.2   Will increase amio to 200 daily to try to quiet down VT. If quiet will then decrease to 100daily again. If recurs  on 100, consider VT abaltion  Check CPX testing  Continue GDMT.   Toribio Fuel, MD  5:14 PM

## 2024-09-15 LAB — MAGNESIUM: Magnesium: 2.2 mg/dL (ref 1.6–2.3)

## 2024-09-15 LAB — BASIC METABOLIC PANEL WITH GFR
BUN/Creatinine Ratio: 16 (ref 9–20)
BUN: 21 mg/dL (ref 6–24)
CO2: 23 mmol/L (ref 20–29)
Calcium: 9.8 mg/dL (ref 8.7–10.2)
Chloride: 99 mmol/L (ref 96–106)
Creatinine, Ser: 1.31 mg/dL — ABNORMAL HIGH (ref 0.76–1.27)
Glucose: 123 mg/dL — ABNORMAL HIGH (ref 70–99)
Potassium: 4.3 mmol/L (ref 3.5–5.2)
Sodium: 137 mmol/L (ref 134–144)
eGFR: 63 mL/min/1.73 (ref >59)

## 2024-09-15 LAB — BRAIN NATRIURETIC PEPTIDE: BNP: 21.3 pg/mL (ref 0.0–100.0)

## 2024-09-17 ENCOUNTER — Encounter: Admitting: Internal Medicine

## 2024-09-17 ENCOUNTER — Ambulatory Visit
Admission: RE | Admit: 2024-09-17 | Discharge: 2024-09-17 | Disposition: A | Discharge status: Home / Self Care | Source: Ambulatory Visit | Attending: Internal Medicine | Admitting: Internal Medicine

## 2024-09-17 ENCOUNTER — Ambulatory Visit: Admitting: Family

## 2024-09-17 DIAGNOSIS — E119 Type 2 diabetes mellitus without complications: Secondary | ICD-10-CM | POA: Diagnosis not present

## 2024-09-17 DIAGNOSIS — I429 Cardiomyopathy, unspecified: Secondary | ICD-10-CM | POA: Diagnosis not present

## 2024-09-17 DIAGNOSIS — I2510 Atherosclerotic heart disease of native coronary artery without angina pectoris: Secondary | ICD-10-CM | POA: Diagnosis not present

## 2024-09-17 DIAGNOSIS — G459 Transient cerebral ischemic attack, unspecified: Secondary | ICD-10-CM | POA: Insufficient documentation

## 2024-09-17 DIAGNOSIS — E785 Hyperlipidemia, unspecified: Secondary | ICD-10-CM | POA: Diagnosis not present

## 2024-09-17 DIAGNOSIS — I340 Nonrheumatic mitral (valve) insufficiency: Secondary | ICD-10-CM | POA: Diagnosis not present

## 2024-09-17 DIAGNOSIS — I110 Hypertensive heart disease with heart failure: Secondary | ICD-10-CM | POA: Insufficient documentation

## 2024-09-17 DIAGNOSIS — I5022 Chronic systolic (congestive) heart failure: Secondary | ICD-10-CM

## 2024-09-17 LAB — ECHOCARDIOGRAM COMPLETE
Area-P 1/2: 4.12 cm2
S' Lateral: 3.4 cm

## 2024-09-18 ENCOUNTER — Other Ambulatory Visit (HOSPITAL_COMMUNITY): Payer: Self-pay | Admitting: Internal Medicine

## 2024-09-28 ENCOUNTER — Ambulatory Visit (INDEPENDENT_AMBULATORY_CARE_PROVIDER_SITE_OTHER)

## 2024-09-28 DIAGNOSIS — Z23 Encounter for immunization: Secondary | ICD-10-CM | POA: Diagnosis not present

## 2024-09-28 NOTE — Progress Notes (Signed)
 Jason Floyd is a 58 y.o. male presents in office today for a nurse visit for Shingles Immunization.   Patient Injection was given in the  Left deltoid. Patient tolerated injection well.   Patient's next injection due n/a, appt made? not applicable   [employer]

## 2024-09-29 ENCOUNTER — Ambulatory Visit (HOSPITAL_COMMUNITY): Attending: Cardiology

## 2024-09-29 DIAGNOSIS — I5042 Chronic combined systolic (congestive) and diastolic (congestive) heart failure: Secondary | ICD-10-CM | POA: Insufficient documentation

## 2024-10-02 DIAGNOSIS — I5043 Acute on chronic combined systolic (congestive) and diastolic (congestive) heart failure: Secondary | ICD-10-CM | POA: Diagnosis not present

## 2024-10-12 ENCOUNTER — Encounter: Payer: Self-pay | Admitting: Family Medicine

## 2024-10-12 NOTE — Telephone Encounter (Signed)
 Patient is requesting a virtual appt. Okay to schedule?

## 2024-10-13 NOTE — Telephone Encounter (Signed)
 Scheduled virtual office visit

## 2024-10-14 ENCOUNTER — Telehealth

## 2024-10-14 ENCOUNTER — Other Ambulatory Visit: Payer: Self-pay | Admitting: Family Medicine

## 2024-10-14 ENCOUNTER — Other Ambulatory Visit (HOSPITAL_COMMUNITY): Payer: Self-pay | Admitting: Internal Medicine

## 2024-10-14 DIAGNOSIS — G459 Transient cerebral ischemic attack, unspecified: Secondary | ICD-10-CM

## 2024-10-14 DIAGNOSIS — M5412 Radiculopathy, cervical region: Secondary | ICD-10-CM

## 2024-10-14 DIAGNOSIS — I2510 Atherosclerotic heart disease of native coronary artery without angina pectoris: Secondary | ICD-10-CM

## 2024-10-14 MED ORDER — PREDNISONE 10 MG (21) PO TBPK: ORAL_TABLET | ORAL | 0 refills | Status: AC

## 2024-10-14 NOTE — Patient Instructions (Addendum)
 Jason Floyd, thank you for joining [name], NP for today's virtual visit.  While this provider is not your primary care provider (PCP), if your PCP is located in our provider database this encounter information will be shared with them immediately following your visit.   A [facility] MyChart account gives you access to today's visit and all your visits, tests, and labs performed at [facility]  click here if you don't have a [facility] MyChart account or go to [email or web address]  Consent: (Patient) Jason Floyd provided verbal consent for this virtual visit at the beginning of the encounter.  Current Medications:  Current Outpatient Medications:    predniSONE  (STERAPRED UNI-PAK 21 TAB) 10 MG (21) TBPK tablet, Take as directed, Disp: 21 tablet, Rfl: 0   allopurinol  (ZYLOPRIM ) 300 MG tablet, TAKE 2 TABLETS (600 MG TOTAL) BY MOUTH DAILY., Disp: 60 tablet, Rfl: 6   amiodarone  (PACERONE ) 200 MG tablet, Take 1 tablet (200 mg total) by mouth daily., Disp: 90 tablet, Rfl: 3   aspirin  EC 81 MG tablet, Take 81 mg by mouth daily. Swallow whole., Disp: , Rfl:    atorvastatin  (LIPITOR) 80 MG tablet, TAKE 1 TABLET BY MOUTH DAILY., Disp: 30 tablet, Rfl: 11   calcium  carbonate (TUMS - DOSED IN MG ELEMENTAL CALCIUM ) 500 MG chewable tablet, Chew 2-3 tablets by mouth daily as needed for indigestion or heartburn., Disp: , Rfl:    carvedilol  (COREG ) 3.125 MG tablet, TAKE 1 TABLET BY MOUTH 2 TIMES DAILY., Disp: 180 tablet, Rfl: 3   cetirizine (ZYRTEC) 10 MG tablet, Take 10 mg by mouth daily., Disp: , Rfl:    cyclobenzaprine  (FLEXERIL ) 10 MG tablet, TAKE 1 TABLET BY MOUTH 3 TIMES DAILY AS NEEDED FOR MUSCLE SPASMS., Disp: 45 tablet, Rfl: 2   ELIQUIS  5 MG TABS tablet, TAKE 1 TABLET BY MOUTH 2 TIMES A DAY, Disp: 60 tablet, Rfl: 11   FARXIGA  10 MG TABS tablet, TAKE 1 TABLET BY MOUTH DAILY BEFORE BREAKFAST., Disp: 90 tablet, Rfl: 2   fenofibrate  160 MG tablet, TAKE 1  TABLET BY MOUTH DAILY., Disp: 90 tablet, Rfl: 1   furosemide  (LASIX ) 20 MG tablet, Take 1 tablet by mouth daily as needed for swelling or a weight gain of 3 pounds or more in 24 hours or 5 pounds in one week., Disp: 30 tablet, Rfl: 5   magnesium  oxide (MAG-OX) 400 MG tablet, Take 1 tab by mouth Twice daily, Disp: 60 tablet, Rfl: 12   methocarbamol  (ROBAXIN ) 500 MG tablet, Take 1 tablet (500 mg total) by mouth every 6 (six) hours as needed for muscle spasms., Disp: 30 tablet, Rfl: 3   mexiletine (MEXITIL ) 150 MG capsule, TAKE 2 CAPSULES BY MOUTH 2 TIMES DAILY., Disp: 120 capsule, Rfl: 11   Naphazoline HCl (CLEAR EYES OP), Place 1 drop into both eyes daily as needed (sore eyes)., Disp: , Rfl:    pantoprazole  (PROTONIX ) 40 MG tablet, TAKE 1 TABLET BY MOUTH DAILY., Disp: 30 tablet, Rfl: 3   potassium chloride  SA (KLOR-CON  M20) 20 MEQ tablet, Take 1 tablet by mouth when taking Lasix , Disp: 30 tablet, Rfl: 5   REPATHA  SURECLICK 140 MG/ML SOAJ, INJECT 140 MG INTO THE SKIN EVERY 14 DAYS., Disp: 6 mL, Rfl: 1   sacubitril -valsartan  (ENTRESTO ) 49-51 MG, TAKE 1 TABLET BY MOUTH 2 TIMES DAILY., Disp: 60 tablet, Rfl: 3   Semaglutide , 2 MG/DOSE, (OZEMPIC , 2 MG/DOSE,) 8 MG/3ML SOPN, INJECT 2 MG INTO THE SKIN ONCE A WEEK., Disp: 3 mL,  Rfl: 6   sodium chloride  (OCEAN) 0.65 % SOLN nasal spray, Place 1 spray into both nostrils as needed (dryness)., Disp: , Rfl:    spironolactone  (ALDACTONE ) 25 MG tablet, TAKE 1 TABLET (25 MG TOTAL) BY MOUTH AT BEDTIME., Disp: 30 tablet, Rfl: 7   traZODone  (DESYREL ) 100 MG tablet, TAKE 1 TABLET (100 MG TOTAL) BY MOUTH AT BEDTIME., Disp: 90 tablet, Rfl: 1   triamcinolone  (KENALOG ) 0.025 % ointment, Apply 1 Application topically daily as needed (Dry skin)., Disp: , Rfl:    VASCEPA  1 g capsule, TAKE 2 CAPSULES BY MOUTH 2 TIMES DAILY., Disp: 120 capsule, Rfl: 4   Medications ordered in this encounter:  Meds ordered this encounter  Medications   predniSONE  (STERAPRED UNI-PAK 21 TAB) 10 MG  (21) TBPK tablet    Sig: Take as directed    Dispense:  21 tablet    Refill:  0    Supervising Provider:   BLAISE ALEENE KIDD [[id number]]     *If you need refills on other medications prior to your next appointment, please contact your pharmacy*  Follow-Up: Call back or seek an in-person evaluation if the symptoms worsen or if the condition fails to improve as anticipated.  [facility] [phone]  Other Instructions   -rest, stretching, heat pad, and lidocaine  patches to site might also be of benefit. -follow up MRI would be good idea- if returns after steroid pack -can ask PCP for this while waiting to get in with neurosx -follow up if not improving, worsening -strict in person -numbness, weakness or changes that impact function    If you have been instructed to have an in-person evaluation today at a local Urgent Care facility, please use the link below. It will take you to a list of all of our available [facility], including address, phone number and hours of operation. Please do not delay care.  [facility]  If you or a family member do not have a primary care provider, use the link below to schedule a visit and establish care. When you choose a [facility] primary care physician or advanced practice provider, you gain a long-term partner in health. Find a Primary Care Provider  Learn more about [facility]'s in-office and virtual care options: [facility] - Get Care Now

## 2024-10-14 NOTE — Progress Notes (Signed)
 Virtual Visit Consent   Jason Floyd, you are scheduled for a virtual visit with a [facility] provider today. Just as with appointments in the office, your consent must be obtained to participate. Your consent will be active for this visit and any virtual visit you may have with one of our providers in the next 365 days. If you have a MyChart account, a copy of this consent can be sent to you electronically.  As this is a virtual visit, video technology does not allow for your provider to perform a traditional examination. This may limit your provider's ability to fully assess your condition. If your provider identifies any concerns that need to be evaluated in person or the need to arrange testing (such as labs, EKG, etc.), we will make arrangements to do so. Although advances in technology are sophisticated, we cannot ensure that it will always work on either your end or our end. If the connection with a video visit is poor, the visit may have to be switched to a telephone visit. With either a video or telephone visit, we are not always able to ensure that we have a secure connection.  By engaging in this virtual visit, you consent to the provision of healthcare and authorize for your insurance to be billed (if applicable) for the services provided during this visit. Depending on your insurance coverage, you may receive a charge related to this service.  I need to obtain your verbal consent now. Are you willing to proceed with your visit today? Jason Floyd has provided verbal consent on 10/14/2024 for a virtual visit (video or telephone). Jason CHRISTELLA Barefoot, NP  Date: 10/14/2024 9:15 AM   Virtual Visit via Video Note   I, Jason Floyd, connected with  Jason Floyd  ([id number], 16-Aug-1966) on 10/14/24 at  9:15 AM EST by a video-enabled telemedicine application and verified that I am speaking with the correct person using two identifiers.  Location: Patient: Virtual  Visit Location Patient: Home Provider: Virtual Visit Location Provider: Home Office   I discussed the limitations of evaluation and management by telemedicine and the availability of in person appointments. The patient expressed understanding and agreed to proceed.    History of Present Illness: Jason Floyd is a 58 y.o. who identifies as a male who was assigned male at birth, and is being seen today for neck pain  Location: Left neck into the traps area. There is no radiation down back or arm.  Of note He did have a fall in Oct that required a CT head and face check (contusions noted- see EPIC results)- they didn't scan neck, but he has known cervical spine changes on MRI (in EPIC for review) in Dec of last year. And a previous C6-7 procedure noted without changes.   Pain today is 5/10, when at its worse it is 7/10 Described as dull achy, nagging feeling- only on the left side as of now. No loss of ROM, numbness or tingling in arm. Limited on OTC use due to blood thinner Tylenol  not helping.    Problems:  Patient Active Problem List   Diagnosis Date Noted   Fall 08/31/2024   Head injury due to trauma 08/31/2024   Type 2 diabetes mellitus with other diabetic kidney complication, without long-term current use of insulin  (HCC) 12/18/2023   Pulmonary nodules 12/12/2023   Hypercoagulable state due to persistent atrial fibrillation (HCC) 11/01/2023   Abnormal TSH 08/09/2023   Acute on chronic combined systolic  and diastolic CHF (congestive heart failure) (HCC) 08/09/2023   Hypothyroid 07/25/2022   Primary osteoarthritis of right knee 10/03/2021   Primary localized osteoarthritis of right knee 10/03/2021   Ischemic cardiomyopathy 03/09/2021   Persistent atrial fibrillation (HCC) 03/09/2021   ICD (implantable cardioverter-defibrillator) in place - BS 12/05/2020   Coronary artery disease involving native coronary artery of native heart without angina pectoris 09/21/2020   Chronic  combined systolic and diastolic congestive heart failure (HCC) 09/21/2020   V-tach (HCC) 09/21/2020   Nasal septal perforation 08/10/2020   TIA (transient ischemic attack) 06/17/2020   GERD (gastroesophageal reflux disease) 08/05/2019   HTN (hypertension) 04/16/2018   Insomnia 01/22/2018   Controlled diabetes mellitus type 2 with complications (HCC) 07/07/2015   Thoracic back pain 04/19/2014   Cervical radiculopathy 08/21/2012   Routine general medical examination at a health care facility 04/17/2012   Hyperlipidemia 04/17/2012   OBESITY 12/07/2009   FASCIITIS, PLANTAR 03/29/2009   PULMONARY EMBOLISM, HX OF 11/18/2008   NEPHROLITHIASIS, HX OF 11/18/2008   DRUG ABUSE, HX OF 11/18/2008   GOUT 03/22/2008   NUMMULAR ECZEMA 03/22/2008    Allergies: No Known Allergies Medications:  Current Outpatient Medications:    allopurinol  (ZYLOPRIM ) 300 MG tablet, TAKE 2 TABLETS (600 MG TOTAL) BY MOUTH DAILY., Disp: 60 tablet, Rfl: 6   amiodarone  (PACERONE ) 200 MG tablet, Take 1 tablet (200 mg total) by mouth daily., Disp: 90 tablet, Rfl: 3   aspirin  EC 81 MG tablet, Take 81 mg by mouth daily. Swallow whole., Disp: , Rfl:    atorvastatin  (LIPITOR) 80 MG tablet, TAKE 1 TABLET BY MOUTH DAILY., Disp: 30 tablet, Rfl: 11   calcium  carbonate (TUMS - DOSED IN MG ELEMENTAL CALCIUM ) 500 MG chewable tablet, Chew 2-3 tablets by mouth daily as needed for indigestion or heartburn., Disp: , Rfl:    carvedilol  (COREG ) 3.125 MG tablet, TAKE 1 TABLET BY MOUTH 2 TIMES DAILY., Disp: 180 tablet, Rfl: 3   cetirizine (ZYRTEC) 10 MG tablet, Take 10 mg by mouth daily., Disp: , Rfl:    cyclobenzaprine  (FLEXERIL ) 10 MG tablet, TAKE 1 TABLET BY MOUTH 3 TIMES DAILY AS NEEDED FOR MUSCLE SPASMS., Disp: 45 tablet, Rfl: 2   ELIQUIS  5 MG TABS tablet, TAKE 1 TABLET BY MOUTH 2 TIMES A DAY, Disp: 60 tablet, Rfl: 11   Evolocumab  (REPATHA  SURECLICK) 140 MG/ML SOAJ, INJECT 140 MG INTO THE SKIN EVERY 14 DAYS., Disp: 6 mL, Rfl: 1   FARXIGA   10 MG TABS tablet, TAKE 1 TABLET BY MOUTH DAILY BEFORE BREAKFAST., Disp: 90 tablet, Rfl: 2   fenofibrate  160 MG tablet, TAKE 1 TABLET BY MOUTH DAILY., Disp: 90 tablet, Rfl: 1   furosemide  (LASIX ) 20 MG tablet, Take 1 tablet by mouth daily as needed for swelling or a weight gain of 3 pounds or more in 24 hours or 5 pounds in one week., Disp: 30 tablet, Rfl: 5   magnesium  oxide (MAG-OX) 400 MG tablet, Take 1 tab by mouth Twice daily, Disp: 60 tablet, Rfl: 12   methocarbamol  (ROBAXIN ) 500 MG tablet, Take 1 tablet (500 mg total) by mouth every 6 (six) hours as needed for muscle spasms., Disp: 30 tablet, Rfl: 3   mexiletine (MEXITIL ) 150 MG capsule, TAKE 2 CAPSULES BY MOUTH 2 TIMES DAILY., Disp: 120 capsule, Rfl: 11   Naphazoline HCl (CLEAR EYES OP), Place 1 drop into both eyes daily as needed (sore eyes)., Disp: , Rfl:    pantoprazole  (PROTONIX ) 40 MG tablet, TAKE 1 TABLET BY MOUTH DAILY.,  Disp: 30 tablet, Rfl: 3   potassium chloride  SA (KLOR-CON  M20) 20 MEQ tablet, Take 1 tablet by mouth when taking Lasix , Disp: 30 tablet, Rfl: 5   sacubitril -valsartan  (ENTRESTO ) 49-51 MG, TAKE 1 TABLET BY MOUTH 2 TIMES DAILY., Disp: 60 tablet, Rfl: 3   Semaglutide , 2 MG/DOSE, (OZEMPIC , 2 MG/DOSE,) 8 MG/3ML SOPN, INJECT 2 MG INTO THE SKIN ONCE A WEEK., Disp: 3 mL, Rfl: 6   sodium chloride  (OCEAN) 0.65 % SOLN nasal spray, Place 1 spray into both nostrils as needed (dryness)., Disp: , Rfl:    spironolactone  (ALDACTONE ) 25 MG tablet, TAKE 1 TABLET (25 MG TOTAL) BY MOUTH AT BEDTIME., Disp: 30 tablet, Rfl: 7   traZODone  (DESYREL ) 100 MG tablet, TAKE 1 TABLET (100 MG TOTAL) BY MOUTH AT BEDTIME., Disp: 90 tablet, Rfl: 1   triamcinolone  (KENALOG ) 0.025 % ointment, Apply 1 Application topically daily as needed (Dry skin)., Disp: , Rfl:    VASCEPA  1 g capsule, TAKE 2 CAPSULES BY MOUTH 2 TIMES DAILY., Disp: 120 capsule, Rfl: 4  Observations/Objective: Patient is well-developed, well-nourished in no acute distress.  Resting  comfortably  at home.  Head is normocephalic, atraumatic.  No labored breathing.  Speech is clear and coherent with logical content.  Patient is alert and oriented at baseline.  ROM intact   Assessment and Plan:  1. Cervical radiculopathy (Primary)  - predniSONE  (STERAPRED UNI-PAK 21 TAB) 10 MG (21) TBPK tablet; Take as directed  Dispense: 21 tablet; Refill: 0  -rest, stretching, heat pad, and lidocaine  patches to site might also be of benefit. -follow up MRI would be good idea- if returns after steroid pack -can ask PCP for this while waiting to get in with neurosx -follow up if not improving, worsening -strict in person -numbness, weakness or changes that impact function  Reviewed side effects, risks and benefits of medication.    Patient acknowledged agreement and understanding of the plan.   Past Medical, Surgical, Social History, Allergies, and Medications have been Reviewed.    Follow Up Instructions: I discussed the assessment and treatment plan with the patient. The patient was provided an opportunity to ask questions and all were answered. The patient agreed with the plan and demonstrated an understanding of the instructions.  A copy of instructions were sent to the patient via MyChart unless otherwise noted below.    The patient was advised to call back or seek an in-person evaluation if the symptoms worsen or if the condition fails to improve as anticipated.    Jason CHRISTELLA Barefoot, NP

## 2024-10-16 ENCOUNTER — Ambulatory Visit (HOSPITAL_COMMUNITY)
Admission: RE | Admit: 2024-10-16 | Discharge: 2024-10-16 | Disposition: A | Source: Ambulatory Visit | Attending: Internal Medicine | Admitting: Internal Medicine

## 2024-10-16 ENCOUNTER — Encounter (HOSPITAL_COMMUNITY): Payer: Self-pay | Admitting: Internal Medicine

## 2024-10-16 VITALS — BP 112/72 | HR 70 | Ht 74.0 in | Wt 218.4 lb

## 2024-10-16 DIAGNOSIS — I5022 Chronic systolic (congestive) heart failure: Secondary | ICD-10-CM

## 2024-10-16 DIAGNOSIS — I472 Ventricular tachycardia, unspecified: Secondary | ICD-10-CM

## 2024-10-16 DIAGNOSIS — I2510 Atherosclerotic heart disease of native coronary artery without angina pectoris: Secondary | ICD-10-CM

## 2024-10-16 DIAGNOSIS — Z95810 Presence of automatic (implantable) cardiac defibrillator: Secondary | ICD-10-CM

## 2024-10-16 NOTE — Progress Notes (Signed)
 [facility] NOTE PCP: Mahlon Comer BRAVO, MD EP: Dr. Fernande HF: Dr. Cherrie  CC:    HPI: Jason Floyd is a 58 y.o. with a PMH of severe systolic heart failure due to NICM, DM2, former polysubstance and tobacco abuse.    Echo 2008 EF 15% in setting of polysubstance abuse.   Echo 12/2007 EF 55-60%  Echo 10/20 EF 55-60% no RWMA.   On 06/07/20 had TIA-like symptoms with mild aphasia and weakness (dropped a can with left hand). Went to PCP had carotid u/s and echo. Carotid u/s 06/22/20 1-39% bilaterally. Echo 8/21 EF down to 25-30%. Brain MRI/MRA. Normal vasculature.  Left corona radiata diffusion-weighted/FLAIR hyperintensity is nonspecific. Differential includes active demyelinating lesionversus subacute insult.  Underwent R/L cath 07/01/20 LAD 100% prox LCX 20% RCA 10%  EF 25-30%  After cath underwent cMRI for viability  - LVEF 26% - LGE suggestive of a very large, wrap-around LAD infarction with extensive no-reflow affecting the subendocardium.   Zio monitored placed for palpitations in 10/21 and found to have VT. Underwent ICD placement by Dr. Fernande on 08/22/20. Was also found to have volume overload and AF.  Echo 05/29/23 EF 35-40%   Admitted 07/16/23 with ICD shocks found to have AF that degenerated to VT. Started amio. Underwent TEE/DC-CV for AF. Post DC-CV complicated by transient hypotension. TEE EF 25%  Echo 10/24 EF 30-35% (probably closer to 35%).   S/p AF ablation 11/24.  Was in the ED 08/31/24 after a fall at the [facility]. Felt like he may have been dehydrated and he had an episode after he got home, he vomited  and collapsed and hit his head. He was notified by his remote transmission of his defibrillator that he had gone into V. tach at that moment and was shocked twice. Head CT negative. Magesium was replaced for a level of 1.8. Potassium was 3.9.   He returns for routine f/u. Exercising everyday. Walking 1 mile per day on TM and doing weights. No CP or  SOB. No edema, orthopnea or PND. BP often low. Systolics in 90s   Echo 11/25 EF read as 25-30% (I think 30-35%) 3-D EF was 39%. RV ok  CPX 11/25 pVO2: 23.1 (72% predicted) pRER 1.14 slope 34       Past Medical History:  Diagnosis Date   AICD (automatic cardioverter/defibrillator) present    Allergy    Arthritis    Atrial fibrillation (HCC)    CHF (congestive heart failure) (HCC)    CHF (congestive heart failure) (HCC)    Diabetes mellitus without complication (HCC)    Drug abuse (HCC)    Eczema    GERD (gastroesophageal reflux disease)    Gout    Heart attack (HCC) 06/10/2020   History of kidney stones    Hyperlipidemia    Hypertension    MI (myocardial infarction) (HCC)    PE (pulmonary embolism)    TIA (transient ischemic attack)    hx of per pt - pt not aware he had not followed by a neurologist   Ventricular tachycardia (HCC)    Current Outpatient Medications  Medication Sig Dispense Refill   allopurinol  (ZYLOPRIM ) 300 MG tablet TAKE 2 TABLETS (600 MG TOTAL) BY MOUTH DAILY. 60 tablet 6   amiodarone  (PACERONE ) 200 MG tablet Take 1 tablet (200 mg total) by mouth daily. 90 tablet 3   aspirin  EC 81 MG tablet Take 81 mg by mouth daily. Swallow whole.     atorvastatin  (LIPITOR) 80  MG tablet TAKE 1 TABLET BY MOUTH DAILY. 30 tablet 11   calcium  carbonate (TUMS - DOSED IN MG ELEMENTAL CALCIUM ) 500 MG chewable tablet Chew 2-3 tablets by mouth daily as needed for indigestion or heartburn.     carvedilol  (COREG ) 3.125 MG tablet TAKE 1 TABLET BY MOUTH 2 TIMES DAILY. 180 tablet 3   cetirizine (ZYRTEC) 10 MG tablet Take 10 mg by mouth daily.     cyclobenzaprine  (FLEXERIL ) 10 MG tablet TAKE 1 TABLET BY MOUTH 3 TIMES DAILY AS NEEDED FOR MUSCLE SPASMS. 45 tablet 2   ELIQUIS  5 MG TABS tablet TAKE 1 TABLET BY MOUTH 2 TIMES A DAY 60 tablet 11   FARXIGA  10 MG TABS tablet TAKE 1 TABLET BY MOUTH DAILY BEFORE BREAKFAST. 90 tablet 2   fenofibrate  160 MG tablet TAKE 1 TABLET BY MOUTH DAILY. 90  tablet 1   furosemide  (LASIX ) 20 MG tablet Take 1 tablet by mouth daily as needed for swelling or a weight gain of 3 pounds or more in 24 hours or 5 pounds in one week. 30 tablet 5   magnesium  oxide (MAG-OX) 400 MG tablet Take 1 tab by mouth Twice daily 60 tablet 12   methocarbamol  (ROBAXIN ) 500 MG tablet Take 1 tablet (500 mg total) by mouth every 6 (six) hours as needed for muscle spasms. 30 tablet 3   mexiletine (MEXITIL ) 150 MG capsule TAKE 2 CAPSULES BY MOUTH 2 TIMES DAILY. 120 capsule 11   Naphazoline HCl (CLEAR EYES OP) Place 1 drop into both eyes daily as needed (sore eyes).     pantoprazole  (PROTONIX ) 40 MG tablet TAKE 1 TABLET BY MOUTH DAILY. 30 tablet 3   potassium chloride  SA (KLOR-CON  M20) 20 MEQ tablet Take 1 tablet by mouth when taking Lasix  30 tablet 5   predniSONE  (STERAPRED UNI-PAK 21 TAB) 10 MG (21) TBPK tablet Take as directed 21 tablet 0   REPATHA  SURECLICK 140 MG/ML SOAJ INJECT 140 MG INTO THE SKIN EVERY 14 DAYS. 6 mL 1   sacubitril -valsartan  (ENTRESTO ) 49-51 MG TAKE 1 TABLET BY MOUTH 2 TIMES DAILY. 60 tablet 3   Semaglutide , 2 MG/DOSE, (OZEMPIC , 2 MG/DOSE,) 8 MG/3ML SOPN INJECT 2 MG INTO THE SKIN ONCE A WEEK. 3 mL 6   sodium chloride  (OCEAN) 0.65 % SOLN nasal spray Place 1 spray into both nostrils as needed (dryness).     spironolactone  (ALDACTONE ) 25 MG tablet TAKE 1 TABLET (25 MG TOTAL) BY MOUTH AT BEDTIME. 30 tablet 7   traZODone  (DESYREL ) 100 MG tablet TAKE 1 TABLET (100 MG TOTAL) BY MOUTH AT BEDTIME. 90 tablet 1   triamcinolone  (KENALOG ) 0.025 % ointment Apply 1 Application topically daily as needed (Dry skin).     VASCEPA  1 g capsule TAKE 2 CAPSULES BY MOUTH 2 TIMES DAILY. 120 capsule 4   No current facility-administered medications for this encounter.   No Known Allergies   Social History   Socioeconomic History   Marital status: Married    Spouse name: Jason Floyd   Number of children: Not on file   Years of education: Not on file   Highest education level:  Bachelor's degree (e.g., BA, AB, BS)  Occupational History   Not on file  Tobacco Use   Smoking status: Former    Current packs/day: 0.00    Average packs/day: 0.3 packs/day for 31.0 years (7.8 ttl pk-yrs)    Types: Cigarettes    Quit date: 07/15/2023    Years since quitting: 1.2   Smokeless tobacco: Never  Tobacco comments:    Former smoker 11/01/23  Vaping Use   Vaping status: Never Used  Substance and Sexual Activity   Alcohol use: Yes    Comment: once a month   Drug use: Not Currently    Types: Marijuana    Comment: gummies every days   Sexual activity: Not on file  Other Topics Concern   Not on file  Social History Narrative   Lives with wife   Caffeine- coffee 4 c daily   Social Drivers of Health   Financial Resource Strain: Low Risk  (08/31/2024)   Overall Financial Resource Strain (CARDIA)    Difficulty of Paying Living Expenses: Not hard at all  Food Insecurity: No Food Insecurity (08/31/2024)   Hunger Vital Sign    Worried About Running Out of Food in the Last Year: Never true    Ran Out of Food in the Last Year: Never true  Transportation Needs: No Transportation Needs (08/31/2024)   PRAPARE - Administrator, Civil Service (Medical): No    Lack of Transportation (Non-Medical): No  Physical Activity: Insufficiently Active (08/31/2024)   Exercise Vital Sign    Days of Exercise per Week: 3 days    Minutes of Exercise per Session: 30 min  Stress: No Stress Concern Present (08/31/2024)   [employer] of Occupational Health - Occupational Stress Questionnaire    Feeling of Stress: Only a little  Social Connections: Unknown (08/31/2024)   Social Connection and Isolation Panel    Frequency of Communication with Friends and Family: Patient declined    Frequency of Social Gatherings with Friends and Family: Patient declined    Attends Religious Services: Never    Database Administrator or Organizations: No    Attends Engineer, Structural:  Not on file    Marital Status: Married  Catering Manager Violence: Not At Risk (07/17/2023)   Humiliation, Afraid, Rape, and Kick questionnaire    Fear of Current or Ex-Partner: No    Emotionally Abused: No    Physically Abused: No    Sexually Abused: No    Family History  Problem Relation Age of Onset   Dementia Mother        frontal-temporal   Stroke Mother    Breast cancer Maternal Aunt    Other Brother        overdose   Colon cancer Neg Hx    Vitals:   10/16/24 1520  BP: 112/72  Pulse: 70  SpO2: 100%  Weight: 99.1 kg (218 lb 6.4 oz)  Height: 6' 2 (1.88 m)   Wt Readings from Last 3 Encounters:  10/16/24 99.1 kg (218 lb 6.4 oz)  09/14/24 99.5 kg (219 lb 6 oz)  09/01/24 99.8 kg (220 lb)   Lab Results  Component Value Date   CREATININE 1.31 (H) 09/14/2024   CREATININE 1.32 (H) 08/31/2024   CREATININE 1.34 08/31/2024     PHYSICAL EXAM: General: Well appearing No resp difficulty HEENT: normal Neck: supple. no JVD.  Cor: Regular rate & rhythm. No rubs, gallops or murmurs. Lungs: clear Abdomen: soft, nontender, nondistended.Good bowel sounds. Extremities: no cyanosis, clubbing, rash, edema Neuro: alert & orientedx3, cranial nerves grossly intact. moves all 4 extremities w/o difficulty. Affect pleasant   ICD interrogated (personally reviewed): HL Score 0 Volume ok No VT/AF Activity level 1.3hr/day -> 2.2hr/day   ASSESSMENT & PLAN:  Chronic Systolic HF due to iCM - Echo 7991 EF 15% in setting of polysubstance abuse.  - Echo  2/09 EF 55-60% - Echo 10/20 EF 55-60% no RWMA.  - Echo 8/21 EF 25-30% in setting of recent LAD infarct - Cath 8/21 LAD 100% LCx 20% RCA 10% - cMRI 9/21 EF 26% Large anterior infarct - BosSCi ICD placed 10/21 for VT - ICD interrogation. HL score 11. No VT/AF. Fluid ok AL 5.1 h/day Personally reviewed - Echo 12/21 EF 30-35% - Echo 6/22 EF 40-45%  - TEE 9/24 EF 25% in setting of AF/VT - Echo 10/24 EF 30-35% (probably closer to 35%).  -  Echo 11/25 EF read as 25-30% (I think 30-35%) 3-D EF was 39%. RV ok - CPX 11/25 pVO2: 23.1 (72% predicted) pRER 1.14 slope 34  - NYHA I.  - Continue carvedilol  3.125 mg bid - Continue Farxiga  10 mg daily. - Continue Entresto  49/51 bid. - Continue spiro 25 mg daily. - Lasix  PRN with 20meq potassium PRN - Doing well. CPX reassuring. Encouraged to continue activity.   2. CAD - cath 8/21 subacute LAD infarct and CTO LAD - No s/s angina - Continue ASA, statin.  - Followed by Lipid clinic - LDL 2/25 was 28  3. VT - s/p ICD 10/21 - s/p ICD shock in 9/24 and again 10/25.  - Felt to be related to LAD scar  - Continue magnesium  400mg  BID - Continue mexilitene 150 bid and amio 100 daily.  - Also considering possible VT ablation at [facility] but would like to manage medically if possible. \ - Consider amio wean - Keep K. 4.0 Mg > 2.0  4. Persistent AF - s/p AF ablation 11/24. - Continue Eliquis   - No bleeding  5. Tobacco use - Has been quit since 9/204. -No change  6. DM2 - Continue SGLT2i, metformin , GLP1-RA  7. Hx Possible TIA - Carotid dopplers ok - Brain MRI/MRA. Vasculature ok. Findings suspicious for cardio-embolism.   8. Hypothyroidism - Continue synthroid  - Per PCP  9. Obesity - Body mass index is 28.04 kg/m. - Continue GLP1-RA - Doing well  Toribio Fuel, MD  3:36 PM

## 2024-10-16 NOTE — Patient Instructions (Signed)
 There has been no changes to your medications.  Your physician recommends that you schedule a follow-up appointment in: 4 months ( April 2026) ** PLEASE CALL THE OFFICE IN FEBRUARY TO ARRANGE YOUR FOLLOW UP APPOINTMENT.**  If you have any questions or concerns before your next appointment please send us  a message through [place] or call our office at [phone].    TO LEAVE A MESSAGE FOR THE NURSE SELECT OPTION 2, PLEASE LEAVE A MESSAGE INCLUDING: YOUR NAME DATE OF BIRTH CALL BACK NUMBER REASON FOR CALL**this is important as we prioritize the call backs  YOU WILL RECEIVE A CALL BACK THE SAME DAY AS LONG AS YOU CALL BEFORE 4:00 PM  At the [facility], you and your health needs are our priority. As part of our continuing mission to provide you with exceptional heart care, we have created designated Provider Care Teams. These Care Teams include your primary Cardiologist (physician) and Advanced Practice Providers (APPs- Physician Assistants and Nurse Practitioners) who all work together to provide you with the care you need, when you need it.   You may see any of the following providers on your designated Care Team at your next follow up: Dr Toribio Fuel Dr Ezra Shuck Dr. Morene Brownie Greig Mosses, NP Caffie Shed, [place] [facility] [place], [place] Beckey Coe, NP Jordan Lee, NP Ellouise Class, NP Tinnie Redman, PharmD Jaun Bash, PharmD   Please be sure to bring in all your medications bottles to every appointment.    Thank you for choosing [facility]

## 2024-10-28 ENCOUNTER — Telehealth: Admitting: Student in an Organized Health Care Education/Training Program

## 2024-10-28 ENCOUNTER — Ambulatory Visit: Admitting: Family Medicine

## 2024-10-28 DIAGNOSIS — G8928 Other chronic postprocedural pain: Secondary | ICD-10-CM

## 2024-10-28 DIAGNOSIS — M542 Cervicalgia: Secondary | ICD-10-CM

## 2024-10-28 NOTE — Progress Notes (Signed)
 MyChart Video Visit    Virtual Visit via Video Note   This format is felt to be most appropriate for this patient at this time. Physical exam was limited by quality of the video and audio technology used for the visit.    Patient location: home Provider location: [facility] Persons involved in the visit: patient, provider  I discussed the limitations of evaluation and management by telemedicine and the availability of in person appointments. The patient expressed understanding and agreed to proceed.  Patient: Jason Floyd   DOB: 1966/03/10   58 y.o. Male  MRN: [id number] Visit Date: 10/28/2024  Today's healthcare provider: Cleatus Debby Specking, MD   Chief Complaint  Patient presents with   Neck Pain    Neck, shoulder/back pain since having fall in September. No pain relief and did speak to a NP and was prescribed prednisone  but that did not seem to help.     Subjective:    HPI  Discussed the use of AI scribe software for clinical note transcription with the patient, who gave verbal consent to proceed.  History of Present Illness Jason Floyd is a 58 year old male with a history of cervical spine surgery who presents with persistent neck pain.  He has been experiencing persistent neck pain for two months. The pain is constant, radiates down the left side of the neck, and sometimes extends to the shoulder and over the ear. Movement exacerbates the pain, while holding the neck still provides relief. The patient reports no pain radiating down the arm and no weakness or clumsiness in the hands.  His medical history includes a ruptured cervical disc approximately ten years ago, treated with discectomy and fusion. Last year, he had similar symptoms that resolved by the time an MRI was conducted, which showed inflammation but required no immediate intervention. Recently, he completed a course of  prednisone  prescribed by a nurse practitioner, which did not alleviate the symptoms.  He uses methocarbamol  daily and Flexeril  at night for muscle relaxation, finding Flexeril  more effective despite causing drowsiness. He engages in regular exercise, including weightlifting and stretching. Being retired allows him time for physical activities, although neck pain limits his ability to engage in physical tasks.     Objective:     Physical Exam   Gen: Well-appearing man MSK: Mildly reduced range of motion with turning his head to the left, normal range of motion and turning his head to the right Neuro: Alert, conversational, able to lift both arms up over his head and denies any issues with weakness    Assessment & Plan:    Problem List Items Addressed This Visit       Unprioritized   Chronic neck pain with history of cervical spinal surgery - Primary   Chronic neck pain flare for about 2 months after a fall in October.  This has been an intermittent issue for him, had a similar flare of discomfort last year.  No red flags or high risk symptoms currently.  He has a history of C6-7 ACDF.  MRI in 2024 showed no spinal canal stenosis or neural foraminal narrowing at this level. Multilevel cervical spondylosis, worst at C4-5, where there is moderate spinal canal stenosis, severe left and moderate right neural foraminal narrowing.  Currently he has no radicular symptoms, so I doubt this is an issue with  nerve impingement.  I think it is most likely either localized osteoarthritis, or perhaps paraspinal muscle strain related to his fall.  He has tried medication management with muscle relaxers without relief.  He is unable to use NSAIDs because of his comorbidities.  He tried a course of prednisone  which did not help.  We talked about next steps in management and I recommended physical therapy.  We talked about potentially needing paraspinal steroid injection, his discomfort has become function  limiting enough that he is open to that.  Will refer him back to [facility] for consultation.  Given his comorbid heart disease, he understands that he likely is not going to be a candidate for surgical intervention unless the discomfort becomes very severe or he has new high risk findings.       Relevant Orders   Ambulatory referral to Physical Therapy   Ambulatory referral to Neurosurgery    Return if symptoms worsen or fail to improve.     I discussed the assessment and treatment plan with the patient. The patient was provided an opportunity to ask questions and all were answered. The patient agreed with the plan and demonstrated an understanding of the instructions.   The patient was advised to call back or seek an in-person evaluation if the symptoms worsen or if the condition fails to improve as anticipated.  I provided 14 minutes of non-face-to-face time during this encounter.  Cleatus Debby Specking, MD [facility]

## 2024-10-28 NOTE — Patient Instructions (Signed)
°  VISIT SUMMARY: Today, we discussed your persistent neck pain and reviewed your history of cervical spine surgery. We also addressed your ventricular tachycardia management.  YOUR PLAN: -CERVICAL SPONDYLOSIS WITH CHRONIC NECK PAIN: Cervical spondylosis is a condition where there is wear and tear affecting the spinal disks in your neck. This can cause chronic neck pain and sometimes nerve compression. We are referring you to neurosurgery to explore the possibility of steroid injections to help manage your pain. You will also start physical therapy to strengthen your shoulder and improve neck mobility. Continue taking methocarbamol  daily and Flexeril  at night as needed for muscle relaxation.  -VENTRICULAR TACHYCARDIA: Ventricular tachycardia is a condition where the heart beats very fast due to abnormal electrical signals in the lower chambers of the heart. Your condition is currently well-managed with amiodarone  and mexiletine. Please continue taking these medications as prescribed to prevent recurrence.  INSTRUCTIONS: Please follow up with neurosurgery for potential steroid injections and start physical therapy as recommended. Continue your current medications for both neck pain and ventricular tachycardia. If you experience any new symptoms or worsening of your condition, contact our office immediately.

## 2024-10-28 NOTE — Assessment & Plan Note (Signed)
 Chronic neck pain flare for about 2 months after a fall in October.  This has been an intermittent issue for him, had a similar flare of discomfort last year.  No red flags or high risk symptoms currently.  He has a history of C6-7 ACDF.  MRI in 2024 showed no spinal canal stenosis or neural foraminal narrowing at this level. Multilevel cervical spondylosis, worst at C4-5, where there is moderate spinal canal stenosis, severe left and moderate right neural foraminal narrowing.  Currently he has no radicular symptoms, so I doubt this is an issue with nerve impingement.  I think it is most likely either localized osteoarthritis, or perhaps paraspinal muscle strain related to his fall.  He has tried medication management with muscle relaxers without relief.  He is unable to use NSAIDs because of his comorbidities.  He tried a course of prednisone  which did not help.  We talked about next steps in management and I recommended physical therapy.  We talked about potentially needing paraspinal steroid injection, his discomfort has become function limiting enough that he is open to that.  Will refer him back to [facility] for consultation.  Given his comorbid heart disease, he understands that he likely is not going to be a candidate for surgical intervention unless the discomfort becomes very severe or he has new high risk findings.

## 2024-11-16 ENCOUNTER — Other Ambulatory Visit: Payer: Self-pay | Admitting: Family Medicine

## 2024-11-16 DIAGNOSIS — I5042 Chronic combined systolic (congestive) and diastolic (congestive) heart failure: Secondary | ICD-10-CM

## 2024-11-17 ENCOUNTER — Encounter: Payer: Self-pay | Admitting: Student in an Organized Health Care Education/Training Program

## 2024-11-17 NOTE — Telephone Encounter (Signed)
 Patient states The neurosurgery office called me to book but they said they don't do injections and I'd need to be referred somewhere else for that.  Can we resend this referral else where for patient?

## 2024-11-18 ENCOUNTER — Other Ambulatory Visit: Payer: Self-pay

## 2024-11-18 ENCOUNTER — Ambulatory Visit: Attending: Student in an Organized Health Care Education/Training Program

## 2024-11-18 DIAGNOSIS — R29898 Other symptoms and signs involving the musculoskeletal system: Secondary | ICD-10-CM | POA: Diagnosis present

## 2024-11-18 DIAGNOSIS — Z9889 Other specified postprocedural states: Secondary | ICD-10-CM | POA: Diagnosis not present

## 2024-11-18 DIAGNOSIS — G8928 Other chronic postprocedural pain: Secondary | ICD-10-CM | POA: Diagnosis not present

## 2024-11-18 DIAGNOSIS — M542 Cervicalgia: Secondary | ICD-10-CM | POA: Insufficient documentation

## 2024-11-18 MED ORDER — CYCLOBENZAPRINE HCL 10 MG PO TABS: 10.0000 mg | ORAL_TABLET | Freq: Three times a day (TID) | ORAL | 2 refills | Status: AC | PRN

## 2024-11-18 NOTE — Therapy (Signed)
 " OUTPATIENT PHYSICAL THERAPY CERVICAL EVALUATION   Patient Name: Jason Floyd MRN: [id number] DOB:June 07, 1966, 59 y.o., male Today's Date: 11/18/2024  END OF SESSION:  PT End of Session - 11/18/24 0951     Visit Number 1    Number of Visits 17    Date for Recertification  01/13/25    PT Start Time 0947    PT Stop Time 1030    PT Time Calculation (min) 43 min          Past Medical History:  Diagnosis Date   AICD (automatic cardioverter/defibrillator) present    Allergy    Arthritis    Atrial fibrillation (HCC)    CHF (congestive heart failure) (HCC)    CHF (congestive heart failure) (HCC)    Diabetes mellitus without complication (HCC)    Drug abuse (HCC)    Eczema    GERD (gastroesophageal reflux disease)    Gout    Heart attack (HCC) 06/10/2020   History of kidney stones    Hyperlipidemia    Hypertension    MI (myocardial infarction) (HCC)    PE (pulmonary embolism)    TIA (transient ischemic attack)    hx of per pt - pt not aware he had not followed by a neurologist   Ventricular tachycardia (HCC)    Past Surgical History:  Procedure Laterality Date   ATRIAL FIBRILLATION ABLATION N/A 10/04/2023   Procedure: ATRIAL FIBRILLATION ABLATION;  Surgeon: Kennyth Chew, MD;  Location: [facility] INVASIVE CV LAB;  Service: Cardiovascular;  Laterality: N/A;   CARDIOVERSION N/A 07/18/2023   Procedure: CARDIOVERSION;  Surgeon: Cherrie Toribio SAUNDERS, MD;  Location: [facility] INVAS[facility] CV [facility];  Service: Cardiovascular;  Laterality: N/A;   CERVICAL FUSION     ICD IMPLANT N/A 08/22/2020   Procedure: ICD IMPLANT;  Surgeon: Fernande Elspeth BROCKS, MD;  Location: [facility] INVASIVE CV LAB;  Service: Cardiovascular;  Laterality: N/A;   KNEE ARTHROSCOPY Right 10/28/2020   Procedure: RIGHT KNEE ARTHROSCOPY, PARTIAL MEDIAL MENISCECTOMY, CHONDROPLASTY;  Surgeon: Sheril Coy, MD;  Location: [facility];  Service: Orthopedics;  Laterality: Right;   left thumb surgery      RIGHT/LEFT HEART CATH AND CORONARY  ANGIOGRAPHY N/A 07/01/2020   Procedure: RIGHT/LEFT HEART CATH AND CORONARY ANGIOGRAPHY;  Surgeon: Cherrie Toribio SAUNDERS, MD;  Location: [facility]VAS[facility] CV [facility];  Service: Cardiovascular;  Laterality: N/A;   SHOULDER ARTHROSCOPY Left    SHOULDER ARTHROSCOPY WITH ROTATOR CUFF REPAIR AND SUBACROMIAL DECOMPRESSION Right 06/14/2022   Procedure: SHOULDER ARTHROSCOPY WITH ROTATOR CUFF REPAIR, SUBACROMIAL DECOMPRESSION AND TENOTOMY;  Surgeon: Dozier Soulier, MD;  Location: [facility];  Service: Orthopedics;  Laterality: Right;   TEE WITHOUT CARDIOVERSION N/A 07/18/2023   Procedure: TRANSESOPHAGEAL ECHOCARDIOGRAM;  Surgeon: Cherrie Toribio SAUNDERS, MD;  Location: [facility] INVASIVE CV LAB;  Service: Cardiovascular;  Laterality: N/A;   TOTAL KNEE ARTHROPLASTY Right 10/03/2021   Procedure: RIGHT TOTAL KNEE ARTHROPLASTY;  Surgeon: Sheril Coy, MD;  Location: [facility];  Service: Orthopedics;  Laterality: Right;   VASECTOMY Bilateral    WISDOM TOOTH EXTRACTION     Patient Active Problem List   Diagnosis Date Noted   Fall 08/31/2024   Head injury due to trauma 08/31/2024   Type 2 diabetes mellitus with other diabetic kidney complication, without long-term current use of insulin  (HCC) 12/18/2023   Pulmonary nodules 12/12/2023   Hypercoagulable state due to persistent atrial fibrillation (HCC) 11/01/2023   Abnormal TSH 08/09/2023   Acute on chronic combined systolic and diastolic CHF (congestive heart failure) (HCC) 08/09/2023  Hypothyroid 07/25/2022   Primary osteoarthritis of right knee 10/03/2021   Primary localized osteoarthritis of right knee 10/03/2021   Ischemic cardiomyopathy 03/09/2021   Persistent atrial fibrillation (HCC) 03/09/2021   ICD (implantable cardioverter-defibrillator) in place - BS 12/05/2020   Coronary artery disease involving native coronary artery of native heart without angina pectoris 09/21/2020   Chronic combined systolic and diastolic congestive heart failure (HCC) 09/21/2020   V-tach (HCC)  09/21/2020   Nasal septal perforation 08/10/2020   TIA (transient ischemic attack) 06/17/2020   GERD (gastroesophageal reflux disease) 08/05/2019   HTN (hypertension) 04/16/2018   Insomnia 01/22/2018   Controlled diabetes mellitus type 2 with complications (HCC) 07/07/2015   Thoracic back pain 04/19/2014   Chronic neck pain with history of cervical spinal surgery 08/21/2012   Routine general medical examination at a health care facility 04/17/2012   Hyperlipidemia 04/17/2012   OBESITY 12/07/2009   FASCIITIS, PLANTAR 03/29/2009   PULMONARY EMBOLISM, HX OF 11/18/2008   NEPHROLITHIASIS, HX OF 11/18/2008   DRUG ABUSE, HX OF 11/18/2008   GOUT 03/22/2008   NUMMULAR ECZEMA 03/22/2008    PCP: Mahlon Comer BRAVO, MD   REFERRING PROVIDER: Jerrell Cleatus Ned, MD   REFERRING DIAG: M54.2,G89.28,Z98.890 (ICD-10-CM) - Chronic neck pain with history of cervical spinal surgery   THERAPY DIAG:  No diagnosis found.  Rationale for Evaluation and Treatment: Rehabilitation  ONSET DATE: 08/12/24  SUBJECTIVE:                                                                                                                                                                                                         SUBJECTIVE STATEMENT:  Pt reports that he had a discectomy 10 years ago, and at that point they told him the disc above was about to go.  Pt notes he has arthritis pretty much all over the place.  Pt notes he was having some issues with radicular symptoms back in 2024, but by the time he received an MRI, his symptoms were back.   Pt has a defibrillator and back in September he had to be shocked 2x at that time, and when it did, he hit his head on the tile floor, landing on his L cheek.  Pt feels as though this may have some implications on the current pain that he's feeling.    Hand dominance: Right  PERTINENT HISTORY:   Per MD: Jason Floyd is a 59 year old male with a history  of cervical spine surgery who presents with persistent neck pain.   He has been experiencing  persistent neck pain for two months. The pain is constant, radiates down the left side of the neck, and sometimes extends to the shoulder and over the ear. Movement exacerbates the pain, while holding the neck still provides relief. The patient reports no pain radiating down the arm and no weakness or clumsiness in the hands.   His medical history includes a ruptured cervical disc approximately ten years ago, treated with discectomy and fusion. Last year, he had similar symptoms that resolved by the time an MRI was conducted, which showed inflammation but required no immediate intervention. Recently, he completed a course of prednisone  prescribed by a nurse practitioner, which did not alleviate the symptoms.  PAIN:  Are you having pain? Yes: NPRS scale: 5/10 Pain location: anterior left portion of the platysma, L sided cervical paraspinals Pain description: pulled muscle? Aggravating factors: Turning to the L hurts the worst Relieving factors: Heat a little, massage a little bit as well.  PRECAUTIONS: Other: pt with prior discectomy and fusion 10 years ago.  RED FLAGS: None     WEIGHT BEARING RESTRICTIONS: No  FALLS:  Has patient fallen in last 6 months? Yes. Number of falls 1  LIVING ENVIRONMENT: Lives with: lives with their spouse Lives in: House/apartment Stairs: Yes: External: 3 steps; on left going up Has following equipment at home: Single point cane and Walker - 2 wheeled  OCCUPATION: Owns business  PLOF: Independent  PATIENT GOALS: To reduce overall pain  NEXT MD VISIT: Unsure  OBJECTIVE:  Note: Objective measures were completed at Evaluation unless otherwise noted.  DIAGNOSTIC FINDINGS:  N/A  PATIENT SURVEYS:  NDI:  NECK DISABILITY INDEX  Date: 11/18/2024 Score  Pain intensity 2 = The pain is moderate at the moment  2. Personal care (washing, dressing, etc.) 0 = I can  look after myself normally without causing extra pain  3. Lifting 0 =  I can lift heavy weights without extra pain  4. Reading 0 = I can read as much as I want to with no pain in my neck  5. Headaches 0 = I have no headaches at all  6. Concentration 0 =  I can concentrate fully when I want to with no difficulty  7. Work 0 =  I can do as much work as I want to  8. Driving 1 =  I can drive my car as long as I want with slight pain in my neck  9. Sleeping 1 = My sleep is slightly disturbed (less than 1 hr sleepless)  10. Recreation 1 =  I am able to engage in all my recreation activities, with some pain in my neck  Total 5/50   Minimum Detectable Change (90% confidence): 5 points or 10% points  COGNITION: Overall cognitive status: Within functional limits for tasks assessed  SENSATION: WFL  POSTURE: No Significant postural limitations  PALPATION: Pt with tightness noted in the L paraspinals, B UT's, and some periscapular tightness noted as well.   CERVICAL ROM:   Active ROM A/PROM (deg) eval  Flexion 54*  Extension 56*  Right lateral flexion 35*  Left lateral flexion 44*  Right rotation 53*  Left rotation 49*   (Blank rows = not tested)  UPPER EXTREMITY ROM:  WNL for Flexion and Abduction bilaterally   CERVICAL SPECIAL TESTS:  Sharp pursor's test: Negative   TREATMENT DATE: 11/18/2024  Manual:  Supine STM to cervical region to increase extensibility of the paraspinals Supine suboccipital release technique to decrease cervicalgia Supine manual traction performed in order to increase joint space in cervical region for pain relief Supine cervical upglides/downglides, 30 sec bouts Supine UT/Levator stretch, 30 sec bouts to increase tissue extensibility of the cervical region     PATIENT EDUCATION:  Education details: Pt educated on role of PT  and services provided during current POC, along with prognosis and information about the clinic. Person educated: Patient Education method: Explanation Education comprehension: verbalized understanding  HOME EXERCISE PROGRAM: TBD at the next visit  ASSESSMENT:  CLINICAL IMPRESSION: Patient is a 59 y.o. male who was seen today for physical therapy evaluation and treatment for cervical pain with mobility.  Pt overall has adequate ROM, however it is painful to mobilize the neck towards his end range.  Pt notes the pain sometimes radiates in the neck and up towards the ears at times.  Pt seems to have some soft tissue complications that are causing some of the neck pain at this time and would benefit from manual therapy approaches, along with scapular stabilization activities to improve overall tolerance to exercise.  Pt will continue to be assessed throughout the subsequent visits and if progress is not made within 3-4 visits, pt advised to likely return to the orthopaedic MD.  Pt agreeable to this POC.  OBJECTIVE IMPAIRMENTS: decreased ROM, hypomobility, and pain.   ACTIVITY LIMITATIONS: carrying, lifting, bending, and reach over head  PARTICIPATION LIMITATIONS: meal prep, laundry, community activity, occupation, and yard work  PERSONAL FACTORS: Past/current experiences, Time since onset of injury/illness/exacerbation, and 3+ comorbidities: HTN, TIA, Afib, CHF, DM2, arthritis, PE, Falls are also affecting patient's functional outcome.   REHAB POTENTIAL: Fair pt with a lack of provocation of pain within the evaluation.  CLINICAL DECISION MAKING: Evolving/moderate complexity  EVALUATION COMPLEXITY: Moderate   GOALS: Goals reviewed with patient? Yes  SHORT TERM GOALS: Target date: 12/16/2024  Pt will be independent with HEP in order to demonstrate increased ability to perform tasks related to occupation/hobbies. Baseline:  Goal status: INITIAL   LONG TERM GOALS: Target date:  01/13/2025  Pt will reduce overall pain level to 0/10 by utilizing a combination of stretching, strengthening exercises, and pain-reducing modalities in order to improve overall QoL. Baseline:  Goal status: INITIAL  2.  Pt will demonstrate decrease in NDI by at least 5% in order to demonstrate clinically significant reduction in disability related to neck injury/pain. Baseline: 5% Goal status: INITIAL   PLAN:  PT FREQUENCY: 2x/week  PT DURATION: 8 weeks  PLANNED INTERVENTIONS: 97750- Physical Performance Testing, 97110-Therapeutic exercises, 97530- Therapeutic activity, 97112- Neuromuscular re-education, 97535- Self Care, 02859- Manual therapy, 20560 (1-2 muscles), 20561 (3+ muscles)- Dry Needling, Patient/Family education, Joint mobilization, Joint manipulation, Spinal mobilization, and Vestibular training  PLAN FOR NEXT SESSION:   Establish HEP, continue with manual therapy     Fonda Simpers, PT, DPT Physical Therapist - [facility]  11/18/2024, 9:53 AM   "

## 2024-11-19 ENCOUNTER — Ambulatory Visit: Payer: BC Managed Care – PPO

## 2024-11-19 DIAGNOSIS — I5042 Chronic combined systolic (congestive) and diastolic (congestive) heart failure: Secondary | ICD-10-CM | POA: Diagnosis not present

## 2024-11-20 ENCOUNTER — Ambulatory Visit: Payer: Self-pay | Admitting: Cardiology

## 2024-11-20 LAB — CUP PACEART REMOTE DEVICE CHECK
Battery Remaining Longevity: 120 mo
Battery Remaining Percentage: 100 %
Brady Statistic RA Percent Paced: 0 %
Brady Statistic RV Percent Paced: 0 %
Date Time Interrogation Session: 20260108005400
HighPow Impedance: 79 Ohm
Implantable Lead Connection Status: 753985
Implantable Lead Connection Status: 753985
Implantable Lead Implant Date: 20211011
Implantable Lead Implant Date: 20211011
Implantable Lead Location: 753859
Implantable Lead Location: 753860
Implantable Lead Model: 138
Implantable Lead Model: 7841
Implantable Lead Serial Number: 1097338
Implantable Lead Serial Number: 303261
Implantable Pulse Generator Implant Date: 20211011
Lead Channel Impedance Value: 649 Ohm
Lead Channel Impedance Value: 664 Ohm
Lead Channel Setting Pacing Amplitude: 2.5 V
Lead Channel Setting Pacing Amplitude: 2.5 V
Lead Channel Setting Pacing Pulse Width: 0.4 ms
Lead Channel Setting Sensing Sensitivity: 0.5 mV
Pulse Gen Serial Number: 228262
Zone Setting Status: 755011

## 2024-11-23 ENCOUNTER — Ambulatory Visit

## 2024-11-23 DIAGNOSIS — R29898 Other symptoms and signs involving the musculoskeletal system: Secondary | ICD-10-CM

## 2024-11-23 DIAGNOSIS — M542 Cervicalgia: Secondary | ICD-10-CM | POA: Diagnosis not present

## 2024-11-23 NOTE — Therapy (Signed)
 " OUTPATIENT PHYSICAL THERAPY CERVICAL TREATMENT   Patient Name: Jason Floyd MRN: [id number] DOB:03-15-1966, 59 y.o., male Today's Date: 11/23/2024  END OF SESSION:  PT End of Session - 11/23/24 0947     Visit Number 2    Number of Visits 17    Date for Recertification  01/13/25    PT Start Time 0947    PT Stop Time 1030    PT Time Calculation (min) 43 min    Activity Tolerance Patient tolerated treatment well          Past Medical History:  Diagnosis Date   AICD (automatic cardioverter/defibrillator) present    Allergy    Arthritis    Atrial fibrillation (HCC)    CHF (congestive heart failure) (HCC)    CHF (congestive heart failure) (HCC)    Diabetes mellitus without complication (HCC)    Drug abuse (HCC)    Eczema    GERD (gastroesophageal reflux disease)    Gout    Heart attack (HCC) 06/10/2020   History of kidney stones    Hyperlipidemia    Hypertension    MI (myocardial infarction) (HCC)    PE (pulmonary embolism)    TIA (transient ischemic attack)    hx of per pt - pt not aware he had not followed by a neurologist   Ventricular tachycardia (HCC)    Past Surgical History:  Procedure Laterality Date   ATRIAL FIBRILLATION ABLATION N/A 10/04/2023   Procedure: ATRIAL FIBRILLATION ABLATION;  Surgeon: Kennyth Chew, MD;  Location: [facility] INVASIVE CV LAB;  Service: Cardiovascular;  Laterality: N/A;   CARDIOVERSION N/A 07/18/2023   Procedure: CARDIOVERSION;  Surgeon: Cherrie Toribio SAUNDERS, MD;  Location: [facility] INVASIVE CV LAB;  Service: Cardiovascular;  Laterality: N/A;   CERVICAL FUSION     ICD IMPLANT N/A 08/22/2020   Procedure: ICD IMPLANT;  Surgeon: Fernande Elspeth BROCKS, MD;  Location: [facility] INVASIVE CV LAB;  Service: Cardiovascular;  Laterality: N/A;   KNEE ARTHROSCOPY Right 10/28/2020   Procedure: RIGHT KNEE ARTHROSCOPY, PARTIAL MEDIAL MENISCECTOMY, CHONDROPLASTY;  Surgeon: Sheril Coy, MD;  Location: [facility];  Service: Orthopedics;  Laterality: Right;   left  thumb surgery      RIGHT/LEFT HEART CATH AND CORONARY ANGIOGRAPHY N/A 07/01/2020   Procedure: RIGHT/LEFT HEART CATH AND CORONARY ANGIOGRAPHY;  Surgeon: Cherrie Toribio SAUNDERS, MD;  Location: [facility] INVASIVE CV LAB;  Service: Cardiovascular;  Laterality: N/A;   SHOULDER ARTHROSCOPY Left    SHOULDER ARTHROSCOPY WITH ROTATOR CUFF REPAIR AND SUBACROMIAL DECOMPRESSION Right 06/14/2022   Procedure: SHOULDER ARTHROSCOPY WITH ROTATOR CUFF REPAIR, SUBACROMIAL DECOMPRESSION AND TENOTOMY;  Surgeon: Dozier Soulier, MD;  Location: [facility];  Service: Orthopedics;  Laterality: Right;   TEE WITHOUT CARDIOVERSION N/A 07/18/2023   Procedure: TRANSESOPHAGEAL ECHOCARDIOGRAM;  Surgeon: Cherrie Toribio SAUNDERS, MD;  Location: [facility] INVASIVE CV LAB;  Service: Cardiovascular;  Laterality: N/A;   TOTAL KNEE ARTHROPLASTY Right 10/03/2021   Procedure: RIGHT TOTAL KNEE ARTHROPLASTY;  Surgeon: Sheril Coy, MD;  Location: [facility];  Service: Orthopedics;  Laterality: Right;   VASECTOMY Bilateral    WISDOM TOOTH EXTRACTION     Patient Active Problem List   Diagnosis Date Noted   Fall 08/31/2024   Head injury due to trauma 08/31/2024   Type 2 diabetes mellitus with other diabetic kidney complication, without long-term current use of insulin  (HCC) 12/18/2023   Pulmonary nodules 12/12/2023   Hypercoagulable state due to persistent atrial fibrillation (HCC) 11/01/2023   Abnormal TSH 08/09/2023   Acute on chronic combined systolic and  diastolic CHF (congestive heart failure) (HCC) 08/09/2023   Hypothyroid 07/25/2022   Primary osteoarthritis of right knee 10/03/2021   Primary localized osteoarthritis of right knee 10/03/2021   Ischemic cardiomyopathy 03/09/2021   Persistent atrial fibrillation (HCC) 03/09/2021   ICD (implantable cardioverter-defibrillator) in place - BS 12/05/2020   Coronary artery disease involving native coronary artery of native heart without angina pectoris 09/21/2020   Chronic combined systolic and diastolic  congestive heart failure (HCC) 09/21/2020   V-tach (HCC) 09/21/2020   Nasal septal perforation 08/10/2020   TIA (transient ischemic attack) 06/17/2020   GERD (gastroesophageal reflux disease) 08/05/2019   HTN (hypertension) 04/16/2018   Insomnia 01/22/2018   Controlled diabetes mellitus type 2 with complications (HCC) 07/07/2015   Thoracic back pain 04/19/2014   Chronic neck pain with history of cervical spinal surgery 08/21/2012   Routine general medical examination at a health care facility 04/17/2012   Hyperlipidemia 04/17/2012   OBESITY 12/07/2009   FASCIITIS, PLANTAR 03/29/2009   PULMONARY EMBOLISM, HX OF 11/18/2008   NEPHROLITHIASIS, HX OF 11/18/2008   DRUG ABUSE, HX OF 11/18/2008   GOUT 03/22/2008   NUMMULAR ECZEMA 03/22/2008    PCP: Mahlon Comer BRAVO, MD   REFERRING PROVIDER: Jerrell Cleatus Ned, MD   REFERRING DIAG: M54.2,G89.28,Z98.890 (ICD-10-CM) - Chronic neck pain with history of cervical spinal surgery   THERAPY DIAG:  Cervical pain  Neck tightness  Rationale for Evaluation and Treatment: Rehabilitation  ONSET DATE: 08/12/24  SUBJECTIVE:                                                                                                                                                                                                         SUBJECTIVE STATEMENT:  Pt reports that he felt a little better after the last visit.  Pt denies any other complaints at this time.    Hand dominance: Right  PERTINENT HISTORY:   Per MD: Williams Dietrick is a 59 year old male with a history of cervical spine surgery who presents with persistent neck pain.   He has been experiencing persistent neck pain for two months. The pain is constant, radiates down the left side of the neck, and sometimes extends to the shoulder and over the ear. Movement exacerbates the pain, while holding the neck still provides relief. The patient reports no pain radiating down the arm and  no weakness or clumsiness in the hands.   His medical history includes a ruptured cervical disc approximately ten years ago, treated with discectomy and fusion. Last year, he had similar symptoms that resolved  by the time an MRI was conducted, which showed inflammation but required no immediate intervention. Recently, he completed a course of prednisone  prescribed by a nurse practitioner, which did not alleviate the symptoms.  PAIN:  Are you having pain? Yes: NPRS scale: 5/10 Pain location: anterior left portion of the platysma, L sided cervical paraspinals Pain description: pulled muscle? Aggravating factors: Turning to the L hurts the worst Relieving factors: Heat a little, massage a little bit as well.  PRECAUTIONS: Other: pt with prior discectomy and fusion 10 years ago.  RED FLAGS: None     WEIGHT BEARING RESTRICTIONS: No  FALLS:  Has patient fallen in last 6 months? Yes. Number of falls 1  LIVING ENVIRONMENT: Lives with: lives with their spouse Lives in: House/apartment Stairs: Yes: External: 3 steps; on left going up Has following equipment at home: Single point cane and Walker - 2 wheeled  OCCUPATION: Owns business  PLOF: Independent  PATIENT GOALS: To reduce overall pain  NEXT MD VISIT: Unsure  OBJECTIVE:  Note: Objective measures were completed at Evaluation unless otherwise noted.  DIAGNOSTIC FINDINGS:  N/A  PATIENT SURVEYS:  NDI:  NECK DISABILITY INDEX  Date: 11/18/2024 Score  Pain intensity 2 = The pain is moderate at the moment  2. Personal care (washing, dressing, etc.) 0 = I can look after myself normally without causing extra pain  3. Lifting 0 =  I can lift heavy weights without extra pain  4. Reading 0 = I can read as much as I want to with no pain in my neck  5. Headaches 0 = I have no headaches at all  6. Concentration 0 =  I can concentrate fully when I want to with no difficulty  7. Work 0 =  I can do as much work as I want to  8. Driving 1 =   I can drive my car as long as I want with slight pain in my neck  9. Sleeping 1 = My sleep is slightly disturbed (less than 1 hr sleepless)  10. Recreation 1 =  I am able to engage in all my recreation activities, with some pain in my neck  Total 5/50   Minimum Detectable Change (90% confidence): 5 points or 10% points  COGNITION: Overall cognitive status: Within functional limits for tasks assessed  SENSATION: WFL  POSTURE: No Significant postural limitations  PALPATION: Pt with tightness noted in the L paraspinals, B UT's, and some periscapular tightness noted as well.   CERVICAL ROM:   Active ROM A/PROM (deg) eval  Flexion 54*  Extension 56*  Right lateral flexion 35*  Left lateral flexion 44*  Right rotation 53*  Left rotation 49*   (Blank rows = not tested)  UPPER EXTREMITY ROM:  WNL for Flexion and Abduction bilaterally   CERVICAL SPECIAL TESTS:  Sharp pursor's test: Negative   TREATMENT DATE: 11/23/2024   Manual:  Supine STM to cervical region to increase extensibility of the paraspinals Supine suboccipital release technique to decrease cervicalgia Supine manual traction performed in order to increase joint space in cervical region for pain relief Supine cervical upglides/downglides, 30 sec bouts Supine UT/Levator stretch, 30 sec bouts to increase tissue extensibility of the cervical region   TherEx:  Seated cable column rows, 45#, 2x10 Seated lat pull downs, 45#, 2x10 Standing wall angels, with focus on cervical retraction while performing shoulder mobility, 2x10    PATIENT EDUCATION:  Education details: Pt educated on role of PT and services provided during current POC, along with prognosis and information about the clinic. Person educated: Patient Education method: Explanation Education comprehension: verbalized  understanding  HOME EXERCISE PROGRAM: TBD at the next visit  ASSESSMENT:  CLINICAL IMPRESSION:  Pt responded well to the exercises and was able to do them without any complications.  Pt noted to have some difficulty with the cervical retractions when performing the wall angels, and will likely need to be assessed at a later visit and maybe more focus placed on that aspect of his recovery.  Pt still having significantly more tightness in the L cervical paraspinals when compared to the R.   Pt will continue to benefit from skilled therapy to address remaining deficits in order to improve overall QoL and return to PLOF.     OBJECTIVE IMPAIRMENTS: decreased ROM, hypomobility, and pain.   ACTIVITY LIMITATIONS: carrying, lifting, bending, and reach over head  PARTICIPATION LIMITATIONS: meal prep, laundry, community activity, occupation, and yard work  PERSONAL FACTORS: Past/current experiences, Time since onset of injury/illness/exacerbation, and 3+ comorbidities: HTN, TIA, Afib, CHF, DM2, arthritis, PE, Falls are also affecting patient's functional outcome.   REHAB POTENTIAL: Fair pt with a lack of provocation of pain within the evaluation.  CLINICAL DECISION MAKING: Evolving/moderate complexity  EVALUATION COMPLEXITY: Moderate   GOALS: Goals reviewed with patient? Yes  SHORT TERM GOALS: Target date: 12/16/2024  Pt will be independent with HEP in order to demonstrate increased ability to perform tasks related to occupation/hobbies. Baseline:  Goal status: INITIAL   LONG TERM GOALS: Target date: 01/13/2025  Pt will reduce overall pain level to 0/10 by utilizing a combination of stretching, strengthening exercises, and pain-reducing modalities in order to improve overall QoL. Baseline:  Goal status: INITIAL  2.  Pt will demonstrate decrease in NDI by at least 5% in order to demonstrate clinically significant reduction in disability related to neck injury/pain. Baseline: 5% Goal  status: INITIAL   PLAN:  PT FREQUENCY: 2x/week  PT DURATION: 8 weeks  PLANNED INTERVENTIONS: 97750- Physical Performance Testing, 97110-Therapeutic exercises, 97530- Therapeutic activity, 97112- Neuromuscular re-education, 97535- Self Care, [id number]- Manual therapy, 20560 (1-2 muscles), 20561 (3+ muscles)- Dry Needling, Patient/Family education, Joint mobilization, Joint manipulation, Spinal mobilization, and Vestibular training  PLAN FOR NEXT SESSION:   Establish HEP, continue with manual therapy     Fonda Simpers, PT, DPT Physical Therapist - [facility]  11/23/2024, 9:48 AM   "

## 2024-11-24 NOTE — Progress Notes (Signed)
 Remote ICD Transmission

## 2024-11-25 ENCOUNTER — Other Ambulatory Visit (HOSPITAL_COMMUNITY): Payer: Self-pay | Admitting: Internal Medicine

## 2024-11-25 ENCOUNTER — Ambulatory Visit

## 2024-11-25 DIAGNOSIS — M542 Cervicalgia: Secondary | ICD-10-CM

## 2024-11-25 DIAGNOSIS — R29898 Other symptoms and signs involving the musculoskeletal system: Secondary | ICD-10-CM

## 2024-11-25 NOTE — Therapy (Signed)
 " OUTPATIENT PHYSICAL THERAPY CERVICAL TREATMENT   Patient Name: Jason Floyd MRN: [id number] DOB:[birth year], 59 y.o., male Today's Date: 11/25/2024  END OF SESSION:  PT End of Session - 11/25/24 1037     Visit Number 3    Number of Visits 17    Date for Recertification  01/13/25    PT Start Time 1036    PT Stop Time 1115    PT Time Calculation (min) 39 min    Activity Tolerance Patient tolerated treatment well         Past Medical History:  Diagnosis Date   AICD (automatic cardioverter/defibrillator) present    Allergy    Arthritis    Atrial fibrillation (HCC)    CHF (congestive heart failure) (HCC)    CHF (congestive heart failure) (HCC)    Diabetes mellitus without complication (HCC)    Drug abuse (HCC)    Eczema    GERD (gastroesophageal reflux disease)    Gout    Heart attack (HCC) 06/10/2020   History of kidney stones    Hyperlipidemia    Hypertension    MI (myocardial infarction) (HCC)    PE (pulmonary embolism)    TIA (transient ischemic attack)    hx of per pt - pt not aware he had not followed by a neurologist   Ventricular tachycardia (HCC)    Past Surgical History:  Procedure Laterality Date   ATRIAL FIBRILLATION ABLATION N/A 10/04/2023   Procedure: ATRIAL FIBRILLATION ABLATION;  Surgeon: Kennyth Chew, MD;  Location: [facility];  Service: Cardiovascular;  Laterality: N/A;   CARDIOVERSION N/A 07/18/2023   Procedure: CARDIOVERSION;  Surgeon: Cherrie Toribio SAUNDERS, MD;  Location: [facility];  Service: Cardiovascular;  Laterality: N/A;   CERVICAL FUSION     ICD IMPLANT N/A 08/22/2020   Procedure: ICD IMPLANT;  Surgeon: Fernande Elspeth BROCKS, MD;  Location: [facility] INVASIVE CV [facility];  Service: Cardiovascular;  Laterality: N/A;   KNEE ARTHROSCOPY Right 10/28/2020   Procedure: RIGHT KNEE ARTHROSCOPY, PARTIAL MEDIAL MENISCECTOMY, CHONDROPLASTY;  Surgeon: Sheril Coy, MD;  Location: [facility];  Service: Orthopedics;  Laterality: Right;   left thumb  surgery      RIGHT/LEFT HEART CATH AND CORONARY ANGIOGRAPHY N/A 07/01/2020   Procedure: RIGHT/LEFT HEART CATH AND CORONARY ANGIOGRAPHY;  Surgeon: Cherrie Toribio SAUNDERS, MD;  Location: [facility];  Service: Cardiovascular;  Laterality: N/A;   SHOULDER ARTHROSCOPY Left    SHOULDER ARTHROSCOPY WITH ROTATOR CUFF REPAIR AND SUBACROMIAL DECOMPRESSION Right 06/14/2022   Procedure: SHOULDER ARTHROSCOPY WITH ROTATOR CUFF REPAIR, SUBACROMIAL DECOMPRESSION AND TENOTOMY;  Surgeon: Dozier Soulier, MD;  Location: [facility];  Service: Orthopedics;  Laterality: Right;   TEE WITHOUT CARDIOVERSION N/A 07/18/2023   Procedure: TRANSESOPHAGEAL ECHOCARDIOGRAM;  Surgeon: Cherrie Toribio SAUNDERS, MD;  Location: [facility] INVASIVE CV LAB;  Service: Cardiovascular;  Laterality: N/A;   TOTAL KNEE ARTHROPLASTY Right 10/03/2021   Procedure: RIGHT TOTAL KNEE ARTHROPLASTY;  Surgeon: Sheril Coy, MD;  Location: [facility];  Service: Orthopedics;  Laterality: Right;   VASECTOMY Bilateral    WISDOM TOOTH EXTRACTION     Patient Active Problem List   Diagnosis Date Noted   Fall 08/31/2024   Head injury due to trauma 08/31/2024   Type 2 diabetes mellitus with other diabetic kidney complication, without long-term current use of insulin  (HCC) 12/18/2023   Pulmonary nodules 12/12/2023   Hypercoagulable state due to persistent atrial fibrillation (HCC) 11/01/2023   Abnormal TSH 08/09/2023   Acute on chronic combined systolic and diastolic  CHF (congestive heart failure) (HCC) 08/09/2023   Hypothyroid 07/25/2022   Primary osteoarthritis of right knee 10/03/2021   Primary localized osteoarthritis of right knee 10/03/2021   Ischemic cardiomyopathy 03/09/2021   Persistent atrial fibrillation (HCC) 03/09/2021   ICD (implantable cardioverter-defibrillator) in place - BS 12/05/2020   Coronary artery disease involving native coronary artery of native heart without angina pectoris 09/21/2020   Chronic combined systolic and diastolic congestive  heart failure (HCC) 09/21/2020   V-tach (HCC) 09/21/2020   Nasal septal perforation 08/10/2020   TIA (transient ischemic attack) 06/17/2020   GERD (gastroesophageal reflux disease) 08/05/2019   HTN (hypertension) 04/16/2018   Insomnia 01/22/2018   Controlled diabetes mellitus type 2 with complications (HCC) 07/07/2015   Thoracic back pain 04/19/2014   Chronic neck pain with history of cervical spinal surgery 08/21/2012   Routine general medical examination at a health care facility 04/17/2012   Hyperlipidemia 04/17/2012   OBESITY 12/07/2009   FASCIITIS, PLANTAR 03/29/2009   PULMONARY EMBOLISM, HX OF 11/18/2008   NEPHROLITHIASIS, HX OF 11/18/2008   DRUG ABUSE, HX OF 11/18/2008   GOUT 03/22/2008   NUMMULAR ECZEMA 03/22/2008    PCP: Mahlon Comer BRAVO, MD   REFERRING PROVIDER: Jerrell Cleatus Ned, MD   REFERRING DIAG: M54.2,G89.28,Z98.890 (ICD-10-CM) - Chronic neck pain with history of cervical spinal surgery   THERAPY DIAG:  Cervical pain  Neck tightness  Rationale for Evaluation and Treatment: Rehabilitation  ONSET DATE: 08/12/24  SUBJECTIVE:                                                                                                                                                                                                         SUBJECTIVE STATEMENT:  Pt reports that he has not been feeling worse, and maybe even a little better.  Pt notes that night time seems to be worse.    Hand dominance: Right  PERTINENT HISTORY:   Per MD: Jason Floyd is a 59 year old male with a history of cervical spine surgery who presents with persistent neck pain.   He has been experiencing persistent neck pain for two months. The pain is constant, radiates down the left side of the neck, and sometimes extends to the shoulder and over the ear. Movement exacerbates the pain, while holding the neck still provides relief. The patient reports no pain radiating down the arm  and no weakness or clumsiness in the hands.   His medical history includes a ruptured cervical disc approximately ten years ago, treated with discectomy and fusion. Last year, he had similar  symptoms that resolved by the time an MRI was conducted, which showed inflammation but required no immediate intervention. Recently, he completed a course of prednisone  prescribed by a nurse practitioner, which did not alleviate the symptoms.  PAIN:  Are you having pain? Yes: NPRS scale: 5/10 Pain location: anterior left portion of the platysma, L sided cervical paraspinals Pain description: pulled muscle? Aggravating factors: Turning to the L hurts the worst Relieving factors: Heat a little, massage a little bit as well.  PRECAUTIONS: Other: pt with prior discectomy and fusion 10 years ago.  RED FLAGS: None     WEIGHT BEARING RESTRICTIONS: No  FALLS:  Has patient fallen in last 6 months? Yes. Number of falls 1  LIVING ENVIRONMENT: Lives with: lives with their spouse Lives in: House/apartment Stairs: Yes: External: 3 steps; on left going up Has following equipment at home: Single point cane and Walker - 2 wheeled  OCCUPATION: Owns business  PLOF: Independent  PATIENT GOALS: To reduce overall pain  NEXT MD VISIT: Unsure  OBJECTIVE:  Note: Objective measures were completed at Evaluation unless otherwise noted.  DIAGNOSTIC FINDINGS:  N/A  PATIENT SURVEYS:  NDI:  NECK DISABILITY INDEX  Date: 11/18/2024 Score  Pain intensity 2 = The pain is moderate at the moment  2. Personal care (washing, dressing, etc.) 0 = I can look after myself normally without causing extra pain  3. Lifting 0 =  I can lift heavy weights without extra pain  4. Reading 0 = I can read as much as I want to with no pain in my neck  5. Headaches 0 = I have no headaches at all  6. Concentration 0 =  I can concentrate fully when I want to with no difficulty  7. Work 0 =  I can do as much work as I want to  8. Driving  1 =  I can drive my car as long as I want with slight pain in my neck  9. Sleeping 1 = My sleep is slightly disturbed (less than 1 hr sleepless)  10. Recreation 1 =  I am able to engage in all my recreation activities, with some pain in my neck  Total 5/50   Minimum Detectable Change (90% confidence): 5 points or 10% points  COGNITION: Overall cognitive status: Within functional limits for tasks assessed  SENSATION: WFL  POSTURE: No Significant postural limitations  PALPATION: Pt with tightness noted in the L paraspinals, B UT's, and some periscapular tightness noted as well.   CERVICAL ROM:   Active ROM A/PROM (deg) eval  Flexion 54*  Extension 56*  Right lateral flexion 35*  Left lateral flexion 44*  Right rotation 53*  Left rotation 49*   (Blank rows = not tested)  UPPER EXTREMITY ROM:  WNL for Flexion and Abduction bilaterally   CERVICAL SPECIAL TESTS:  Sharp pursor's test: Negative   TREATMENT DATE: 11/25/2024  TherEx:  Seated UBE, LVL 3, 2 min forward/2 min retro  Standing upper rows with OMEGA, 25#, 2x10  Seated cable column rows, 25#, 2x10 Seated lat pull downs, 25#, 2x10 Standing wall angels, with focus on cervical retraction while performing shoulder mobility, 2x10  Standing monster walks into forward flexion, with BlueTB, x10 Standing serratus push ups against the wall, 2x10 Standing serratus rolls with blue bolster, 2x10 Standing forward flexion slides with serratus lift off, 2x10  Standing pushups with TRX, 2x10  Tidal Tank carries around the gym with 90/90 bent elbows, x3 laps around the gym, 20#  holding the tank slightly away from core to improve scapular stability     PATIENT EDUCATION:  Education details: Pt educated on role of PT and services provided during current POC, along with prognosis and information about the clinic. Person educated: Patient Education method: Explanation Education comprehension: verbalized  understanding  HOME EXERCISE PROGRAM: TBD at the next visit  ASSESSMENT:  CLINICAL IMPRESSION:  Pt responded well to the provided exercises and demonstrated reduced strength in the UE's when performing some of the exercises, specifically with the push-ups utilizing the TRX machine.  Pt noted to respond well to the serratus targeted exercises as well.  Pt will continue to rely less on manual therapy and more on UE strengthening and stability of the cervical region.   Pt will continue to benefit from skilled therapy to address remaining deficits in order to improve overall QoL and return to PLOF.      OBJECTIVE IMPAIRMENTS: decreased ROM, hypomobility, and pain.   ACTIVITY LIMITATIONS: carrying, lifting, bending, and reach over head  PARTICIPATION LIMITATIONS: meal prep, laundry, community activity, occupation, and yard work  PERSONAL FACTORS: Past/current experiences, Time since onset of injury/illness/exacerbation, and 3+ comorbidities: HTN, TIA, Afib, CHF, DM2, arthritis, PE, Falls are also affecting patient's functional outcome.   REHAB POTENTIAL: Fair pt with a lack of provocation of pain within the evaluation.  CLINICAL DECISION MAKING: Evolving/moderate complexity  EVALUATION COMPLEXITY: Moderate   GOALS: Goals reviewed with patient? Yes  SHORT TERM GOALS: Target date: 12/16/2024  Pt will be independent with HEP in order to demonstrate increased ability to perform tasks related to occupation/hobbies. Baseline:  Goal status: INITIAL   LONG TERM GOALS: Target date: 01/13/2025  Pt will reduce overall pain level to 0/10 by utilizing a combination of stretching, strengthening exercises, and pain-reducing modalities in order to improve overall QoL. Baseline:  Goal status: INITIAL  2.  Pt will demonstrate decrease in NDI by at least 5% in order to demonstrate clinically significant reduction in disability related to neck injury/pain. Baseline: 5% Goal status:  INITIAL   PLAN:  PT FREQUENCY: 2x/week  PT DURATION: 8 weeks  PLANNED INTERVENTIONS: 97750- Physical Performance Testing, 97110-Therapeutic exercises, 97530- Therapeutic activity, 97112- Neuromuscular re-education, 97535- Self Care, 02859- Manual therapy, 20560 (1-2 muscles), 20561 (3+ muscles)- Dry Needling, Patient/Family education, Joint mobilization, Joint manipulation, Spinal mobilization, and Vestibular training  PLAN FOR NEXT SESSION:   Continue with manual therapy coupled with UE strengthening program  Fonda Simpers, PT, DPT Physical Therapist - [facility]  11/25/2024, 12:55 PM  "

## 2024-11-30 ENCOUNTER — Other Ambulatory Visit (HOSPITAL_COMMUNITY): Payer: Self-pay | Admitting: Internal Medicine

## 2024-11-30 ENCOUNTER — Other Ambulatory Visit: Payer: Self-pay | Admitting: Family Medicine

## 2024-11-30 ENCOUNTER — Ambulatory Visit

## 2024-11-30 DIAGNOSIS — R29898 Other symptoms and signs involving the musculoskeletal system: Secondary | ICD-10-CM

## 2024-11-30 DIAGNOSIS — I5042 Chronic combined systolic (congestive) and diastolic (congestive) heart failure: Secondary | ICD-10-CM

## 2024-11-30 DIAGNOSIS — M542 Cervicalgia: Secondary | ICD-10-CM

## 2024-11-30 NOTE — Therapy (Signed)
 " OUTPATIENT PHYSICAL THERAPY CERVICAL TREATMENT   Patient Name: Jason Floyd MRN: [id number] DOB:1966/10/10, 59 y.o., male Today's Date: 11/30/2024  END OF SESSION:  PT End of Session - 11/30/24 0904     Visit Number 4    Number of Visits 17    Date for Recertification  01/13/25    PT Start Time 0904    PT Stop Time 0945    PT Time Calculation (min) 41 min    Activity Tolerance Patient tolerated treatment well         Past Medical History:  Diagnosis Date   AICD (automatic cardioverter/defibrillator) present    Allergy    Arthritis    Atrial fibrillation (HCC)    CHF (congestive heart failure) (HCC)    CHF (congestive heart failure) (HCC)    Diabetes mellitus without complication (HCC)    Drug abuse (HCC)    Eczema    GERD (gastroesophageal reflux disease)    Gout    Heart attack (HCC) 06/10/2020   History of kidney stones    Hyperlipidemia    Hypertension    MI (myocardial infarction) (HCC)    PE (pulmonary embolism)    TIA (transient ischemic attack)    hx of per pt - pt not aware he had not followed by a neurologist   Ventricular tachycardia (HCC)    Past Surgical History:  Procedure Laterality Date   ATRIAL FIBRILLATION ABLATION N/A 10/04/2023   Procedure: ATRIAL FIBRILLATION ABLATION;  Surgeon: Kennyth Chew, MD;  Location: [facility] INVASIVE CV LAB;  Service: Cardiovascular;  Laterality: N/A;   CARDIOVERSION N/A 07/18/2023   Procedure: CARDIOVERSION;  Surgeon: Cherrie Toribio SAUNDERS, MD;  Location: [facility];  Service: Cardiovascular;  Laterality: N/A;   CERVICAL FUSION     ICD IMPLANT N/A 08/22/2020   Procedure: ICD IMPLANT;  Surgeon: Fernande Elspeth BROCKS, MD;  Location: [facility] INVASIVE CV LAB;  Service: Cardiovascular;  Laterality: N/A;   KNEE ARTHROSCOPY Right 10/28/2020   Procedure: RIGHT KNEE ARTHROSCOPY, PARTIAL MEDIAL MENISCECTOMY, CHONDROPLASTY;  Surgeon: Sheril Coy, MD;  Location: [facility];  Service: Orthopedics;  Laterality: Right;   left thumb  surgery      RIGHT/LEFT HEART CATH AND CORONARY ANGIOGRAPHY N/A 07/01/2020   Procedure: RIGHT/LEFT HEART CATH AND CORONARY ANGIOGRAPHY;  Surgeon: Cherrie Toribio SAUNDERS, MD;  Location: [facility];  Service: Cardiovascular;  Laterality: N/A;   SHOULDER ARTHROSCOPY Left    SHOULDER ARTHROSCOPY WITH ROTATOR CUFF REPAIR AND SUBACROMIAL DECOMPRESSION Right 06/14/2022   Procedure: SHOULDER ARTHROSCOPY WITH ROTATOR CUFF REPAIR, SUBACROMIAL DECOMPRESSION AND TENOTOMY;  Surgeon: Dozier Soulier, MD;  Location: [facility];  Service: Orthopedics;  Laterality: Right;   TEE WITHOUT CARDIOVERSION N/A 07/18/2023   Procedure: TRANSESOPHAGEAL ECHOCARDIOGRAM;  Surgeon: Cherrie Toribio SAUNDERS, MD;  Location: [facility] INVASIVE CV LAB;  Service: Cardiovascular;  Laterality: N/A;   TOTAL KNEE ARTHROPLASTY Right 10/03/2021   Procedure: RIGHT TOTAL KNEE ARTHROPLASTY;  Surgeon: Sheril Coy, MD;  Location: [facility];  Service: Orthopedics;  Laterality: Right;   VASECTOMY Bilateral    WISDOM TOOTH EXTRACTION     Patient Active Problem List   Diagnosis Date Noted   Fall 08/31/2024   Head injury due to trauma 08/31/2024   Type 2 diabetes mellitus with other diabetic kidney complication, without long-term current use of insulin  (HCC) 12/18/2023   Pulmonary nodules 12/12/2023   Hypercoagulable state due to persistent atrial fibrillation (HCC) 11/01/2023   Abnormal TSH 08/09/2023   Acute on chronic combined systolic and diastolic  CHF (congestive heart failure) (HCC) 08/09/2023   Hypothyroid 07/25/2022   Primary osteoarthritis of right knee 10/03/2021   Primary localized osteoarthritis of right knee 10/03/2021   Ischemic cardiomyopathy 03/09/2021   Persistent atrial fibrillation (HCC) 03/09/2021   ICD (implantable cardioverter-defibrillator) in place - BS 12/05/2020   Coronary artery disease involving native coronary artery of native heart without angina pectoris 09/21/2020   Chronic combined systolic and diastolic congestive  heart failure (HCC) 09/21/2020   V-tach (HCC) 09/21/2020   Nasal septal perforation 08/10/2020   TIA (transient ischemic attack) 06/17/2020   GERD (gastroesophageal reflux disease) 08/05/2019   HTN (hypertension) 04/16/2018   Insomnia 01/22/2018   Controlled diabetes mellitus type 2 with complications (HCC) 07/07/2015   Thoracic back pain 04/19/2014   Chronic neck pain with history of cervical spinal surgery 08/21/2012   Routine general medical examination at a health care facility 04/17/2012   Hyperlipidemia 04/17/2012   OBESITY 12/07/2009   FASCIITIS, PLANTAR 03/29/2009   PULMONARY EMBOLISM, HX OF 11/18/2008   NEPHROLITHIASIS, HX OF 11/18/2008   DRUG ABUSE, HX OF 11/18/2008   GOUT 03/22/2008   NUMMULAR ECZEMA 03/22/2008    PCP: Mahlon Comer BRAVO, MD   REFERRING PROVIDER: Jerrell Cleatus Ned, MD   REFERRING DIAG: M54.2,G89.28,Z98.890 (ICD-10-CM) - Chronic neck pain with history of cervical spinal surgery   THERAPY DIAG:  Cervical pain  Neck tightness  Rationale for Evaluation and Treatment: Rehabilitation  ONSET DATE: 08/12/24  SUBJECTIVE:                                                                                                                                                                                                         SUBJECTIVE STATEMENT:  Pt reports that his neck was really bothering him yesterday.  Pt notes his neck is a little better today compared to yesterday.    Hand dominance: Right  PERTINENT HISTORY:   Per MD: Jason Floyd is a 59 year old male with a history of cervical spine surgery who presents with persistent neck pain.   He has been experiencing persistent neck pain for two months. The pain is constant, radiates down the left side of the neck, and sometimes extends to the shoulder and over the ear. Movement exacerbates the pain, while holding the neck still provides relief. The patient reports no pain radiating down the  arm and no weakness or clumsiness in the hands.   His medical history includes a ruptured cervical disc approximately ten years ago, treated with discectomy and fusion. Last year, he had similar symptoms that  resolved by the time an MRI was conducted, which showed inflammation but required no immediate intervention. Recently, he completed a course of prednisone  prescribed by a nurse practitioner, which did not alleviate the symptoms.  PAIN:  Are you having pain? Yes: NPRS scale: 5/10 Pain location: anterior left portion of the platysma, L sided cervical paraspinals Pain description: pulled muscle? Aggravating factors: Turning to the L hurts the worst Relieving factors: Heat a little, massage a little bit as well.  PRECAUTIONS: Other: pt with prior discectomy and fusion 10 years ago.  RED FLAGS: None     WEIGHT BEARING RESTRICTIONS: No  FALLS:  Has patient fallen in last 6 months? Yes. Number of falls 1  LIVING ENVIRONMENT: Lives with: lives with their spouse Lives in: House/apartment Stairs: Yes: External: 3 steps; on left going up Has following equipment at home: Single point cane and Walker - 2 wheeled  OCCUPATION: Owns business  PLOF: Independent  PATIENT GOALS: To reduce overall pain  NEXT MD VISIT: Unsure  OBJECTIVE:  Note: Objective measures were completed at Evaluation unless otherwise noted.  DIAGNOSTIC FINDINGS:  N/A  PATIENT SURVEYS:  NDI:  NECK DISABILITY INDEX  Date: 11/18/2024 Score  Pain intensity 2 = The pain is moderate at the moment  2. Personal care (washing, dressing, etc.) 0 = I can look after myself normally without causing extra pain  3. Lifting 0 =  I can lift heavy weights without extra pain  4. Reading 0 = I can read as much as I want to with no pain in my neck  5. Headaches 0 = I have no headaches at all  6. Concentration 0 =  I can concentrate fully when I want to with no difficulty  7. Work 0 =  I can do as much work as I want to  8.  Driving 1 =  I can drive my car as long as I want with slight pain in my neck  9. Sleeping 1 = My sleep is slightly disturbed (less than 1 hr sleepless)  10. Recreation 1 =  I am able to engage in all my recreation activities, with some pain in my neck  Total 5/50   Minimum Detectable Change (90% confidence): 5 points or 10% points  COGNITION: Overall cognitive status: Within functional limits for tasks assessed  SENSATION: WFL  POSTURE: No Significant postural limitations  PALPATION: Pt with tightness noted in the L paraspinals, B UT's, and some periscapular tightness noted as well.   CERVICAL ROM:   Active ROM A/PROM (deg) eval  Flexion 54*  Extension 56*  Right lateral flexion 35*  Left lateral flexion 44*  Right rotation 53*  Left rotation 49*   (Blank rows = not tested)  UPPER EXTREMITY ROM:  WNL for Flexion and Abduction bilaterally   CERVICAL SPECIAL TESTS:  Sharp pursor's test: Negative   TREATMENT DATE: 11/30/24  Manual:  Supine STM to cervical region to increase extensibility of the paraspinals Supine suboccipital release technique to decrease cervicalgia Supine manual traction performed in order to increase joint space in cervical region for pain relief Supine cervical upglides/downglides, 30 sec bouts Supine UT/Levator stretch, 30 sec bouts to increase tissue extensibility of the cervical region   TherEx:  Standing pushups with TRX, 2x10 Standing reclined rows with TRX, 2x10     PATIENT EDUCATION:  Education details: Pt educated on role of PT and services provided during current POC, along with prognosis and information about the clinic. Person educated: Patient Education  method: Explanation Education comprehension: verbalized understanding  HOME EXERCISE PROGRAM: TBD at the next visit  ASSESSMENT:  CLINICAL IMPRESSION:  Pt responded well to the manual therapy and appreciative of having it perform due to the increased pain in the neck  from the day previously.  Pt is still making progress towards goals at this time and is improving in his overall ROM of the neck at this time.  Pt will continue to improve with strengthening exercises at the next visit.   Pt will continue to benefit from skilled therapy to address remaining deficits in order to improve overall QoL and return to PLOF.       OBJECTIVE IMPAIRMENTS: decreased ROM, hypomobility, and pain.   ACTIVITY LIMITATIONS: carrying, lifting, bending, and reach over head  PARTICIPATION LIMITATIONS: meal prep, laundry, community activity, occupation, and yard work  PERSONAL FACTORS: Past/current experiences, Time since onset of injury/illness/exacerbation, and 3+ comorbidities: HTN, TIA, Afib, CHF, DM2, arthritis, PE, Falls are also affecting patient's functional outcome.   REHAB POTENTIAL: Fair pt with a lack of provocation of pain within the evaluation.  CLINICAL DECISION MAKING: Evolving/moderate complexity  EVALUATION COMPLEXITY: Moderate   GOALS: Goals reviewed with patient? Yes  SHORT TERM GOALS: Target date: 12/16/2024  Pt will be independent with HEP in order to demonstrate increased ability to perform tasks related to occupation/hobbies. Baseline:  Goal status: INITIAL   LONG TERM GOALS: Target date: 01/13/2025  Pt will reduce overall pain level to 0/10 by utilizing a combination of stretching, strengthening exercises, and pain-reducing modalities in order to improve overall QoL. Baseline:  Goal status: INITIAL  2.  Pt will demonstrate decrease in NDI by at least 5% in order to demonstrate clinically significant reduction in disability related to neck injury/pain. Baseline: 5% Goal status: INITIAL   PLAN:  PT FREQUENCY: 2x/week  PT DURATION: 8 weeks  PLANNED INTERVENTIONS: 97750- Physical Performance Testing, 97110-Therapeutic exercises, 97530- Therapeutic activity, 97112- Neuromuscular re-education, 97535- Self Care, 02859- Manual therapy, 20560  (1-2 muscles), 20561 (3+ muscles)- Dry Needling, Patient/Family education, Joint mobilization, Joint manipulation, Spinal mobilization, and Vestibular training  PLAN FOR NEXT SESSION:   Continue with manual therapy coupled with UE strengthening program  Fonda Simpers, PT, DPT Physical Therapist - [facility]  11/30/24, 11:34 AM  "

## 2024-12-02 ENCOUNTER — Ambulatory Visit

## 2024-12-02 DIAGNOSIS — M542 Cervicalgia: Secondary | ICD-10-CM

## 2024-12-02 DIAGNOSIS — R29898 Other symptoms and signs involving the musculoskeletal system: Secondary | ICD-10-CM

## 2024-12-02 NOTE — Therapy (Signed)
 " OUTPATIENT PHYSICAL THERAPY CERVICAL TREATMENT   Patient Name: Jason Floyd MRN: [id number] DOB:June 05, 1966, 59 y.o., male Today's Date: 12/02/2024  END OF SESSION:  PT End of Session - 12/02/24 1105     Visit Number 5    Number of Visits 17    Date for Recertification  01/13/25    PT Start Time 1102    PT Stop Time 1145    PT Time Calculation (min) 43 min    Activity Tolerance Patient tolerated treatment well         Past Medical History:  Diagnosis Date   AICD (automatic cardioverter/defibrillator) present    Allergy    Arthritis    Atrial fibrillation (HCC)    CHF (congestive heart failure) (HCC)    CHF (congestive heart failure) (HCC)    Diabetes mellitus without complication (HCC)    Drug abuse (HCC)    Eczema    GERD (gastroesophageal reflux disease)    Gout    Heart attack (HCC) 06/10/2020   History of kidney stones    Hyperlipidemia    Hypertension    MI (myocardial infarction) (HCC)    PE (pulmonary embolism)    TIA (transient ischemic attack)    hx of per pt - pt not aware he had not followed by a neurologist   Ventricular tachycardia (HCC)    Past Surgical History:  Procedure Laterality Date   ATRIAL FIBRILLATION ABLATION N/A 10/04/2023   Procedure: ATRIAL FIBRILLATION ABLATION;  Surgeon: Kennyth Chew, MD;  Location: [facility];  Service: Cardiovascular;  Laterality: N/A;   CARDIOVERSION N/A 07/18/2023   Procedure: CARDIOVERSION;  Surgeon: Cherrie Toribio SAUNDERS, MD;  Location: [facility];  Service: Cardiovascular;  Laterality: N/A;   CERVICAL FUSION     ICD IMPLANT N/A 08/22/2020   Procedure: ICD IMPLANT;  Surgeon: Fernande Elspeth BROCKS, MD;  Location: [facility];  Service: Cardiovascular;  Laterality: N/A;   KNEE ARTHROSCOPY Right 10/28/2020   Procedure: RIGHT KNEE ARTHROSCOPY, PARTIAL MEDIAL MENISCECTOMY, CHONDROPLASTY;  Surgeon: Sheril Coy, MD;  Location: [facility];  Service: Orthopedics;  Laterality: Right;   left thumb  surgery      RIGHT/LEFT HEART CATH AND CORONARY ANGIOGRAPHY N/A 07/01/2020   Procedure: RIGHT/LEFT HEART CATH AND CORONARY ANGIOGRAPHY;  Surgeon: Cherrie Toribio SAUNDERS, MD;  Location: [facility];  Service: Cardiovascular;  Laterality: N/A;   SHOULDER ARTHROSCOPY Left    SHOULDER ARTHROSCOPY WITH ROTATOR CUFF REPAIR AND SUBACROMIAL DECOMPRESSION Right 06/14/2022   Procedure: SHOULDER ARTHROSCOPY WITH ROTATOR CUFF REPAIR, SUBACROMIAL DECOMPRESSION AND TENOTOMY;  Surgeon: Dozier Soulier, MD;  Location: [facility];  Service: Orthopedics;  Laterality: Right;   TEE WITHOUT CARDIOVERSION N/A 07/18/2023   Procedure: TRANSESOPHAGEAL ECHOCARDIOGRAM;  Surgeon: Cherrie Toribio SAUNDERS, MD;  Location: [facility] INV[facility]IVE CV LAB;  Service: Cardiovascular;  Laterality: N/A;   TOTAL KNEE ARTHROPLASTY Right 10/03/2021   Procedure: RIGHT TOTAL KNEE ARTHROPLASTY;  Surgeon: Sheril Coy, MD;  Location: [facility];  Service: Orthopedics;  Laterality: Right;   VASECTOMY Bilateral    WISDOM TOOTH EXTRACTION     Patient Active Problem List   Diagnosis Date Noted   Fall 08/31/2024   Head injury due to trauma 08/31/2024   Type 2 diabetes mellitus with other diabetic kidney complication, without long-term current use of insulin  (HCC) 12/18/2023   Pulmonary nodules 12/12/2023   Hypercoagulable state due to persistent atrial fibrillation (HCC) 11/01/2023   Abnormal TSH 08/09/2023   Acute on chronic combined systolic and diastolic  CHF (congestive heart failure) (HCC) 08/09/2023   Hypothyroid 07/25/2022   Primary osteoarthritis of right knee 10/03/2021   Primary localized osteoarthritis of right knee 10/03/2021   Ischemic cardiomyopathy 03/09/2021   Persistent atrial fibrillation (HCC) 03/09/2021   ICD (implantable cardioverter-defibrillator) in place - BS 12/05/2020   Coronary artery disease involving native coronary artery of native heart without angina pectoris 09/21/2020   Chronic combined systolic and diastolic congestive  heart failure (HCC) 09/21/2020   V-tach (HCC) 09/21/2020   Nasal septal perforation 08/10/2020   TIA (transient ischemic attack) 06/17/2020   GERD (gastroesophageal reflux disease) 08/05/2019   HTN (hypertension) 04/16/2018   Insomnia 01/22/2018   Controlled diabetes mellitus type 2 with complications (HCC) 07/07/2015   Thoracic back pain 04/19/2014   Chronic neck pain with history of cervical spinal surgery 08/21/2012   Routine general medical examination at a health care facility 04/17/2012   Hyperlipidemia 04/17/2012   OBESITY 12/07/2009   FASCIITIS, PLANTAR 03/29/2009   PULMONARY EMBOLISM, HX OF 11/18/2008   NEPHROLITHIASIS, HX OF 11/18/2008   DRUG ABUSE, HX OF 11/18/2008   GOUT 03/22/2008   NUMMULAR ECZEMA 03/22/2008    PCP: Mahlon Comer BRAVO, MD   REFERRING PROVIDER: Jerrell Cleatus Ned, MD   REFERRING DIAG: M54.2,G89.28,Z98.890 (ICD-10-CM) - Chronic neck pain with history of cervical spinal surgery   THERAPY DIAG:  Cervical pain  Neck tightness  Rationale for Evaluation and Treatment: Rehabilitation  ONSET DATE: 08/12/24  SUBJECTIVE:                                                                                                                                                                                                         SUBJECTIVE STATEMENT:  Pt reports that the neck was better Tuesday, and then that night it became sore that night.  Pt reports that taking the pressure off of the neck when lying down reduces the pain.  Pt notes increased pain this morning however.  3-4/10 reported upon arrival.  Hand dominance: Right  PERTINENT HISTORY:   Per MD: Jason Floyd is a 59 year old male with a history of cervical spine surgery who presents with persistent neck pain.   He has been experiencing persistent neck pain for two months. The pain is constant, radiates down the left side of the neck, and sometimes extends to the shoulder and over the  ear. Movement exacerbates the pain, while holding the neck still provides relief. The patient reports no pain radiating down the arm and no weakness or clumsiness in the hands.   His medical  history includes a ruptured cervical disc approximately ten years ago, treated with discectomy and fusion. Last year, he had similar symptoms that resolved by the time an MRI was conducted, which showed inflammation but required no immediate intervention. Recently, he completed a course of prednisone  prescribed by a nurse practitioner, which did not alleviate the symptoms.  PAIN:  Are you having pain? Yes: NPRS scale: 5/10 Pain location: anterior left portion of the platysma, L sided cervical paraspinals Pain description: pulled muscle? Aggravating factors: Turning to the L hurts the worst Relieving factors: Heat a little, massage a little bit as well.  PRECAUTIONS: Other: pt with prior discectomy and fusion 10 years ago.  RED FLAGS: None     WEIGHT BEARING RESTRICTIONS: No  FALLS:  Has patient fallen in last 6 months? Yes. Number of falls 1  LIVING ENVIRONMENT: Lives with: lives with their spouse Lives in: House/apartment Stairs: Yes: External: 3 steps; on left going up Has following equipment at home: Single point cane and Walker - 2 wheeled  OCCUPATION: Owns business  PLOF: Independent  PATIENT GOALS: To reduce overall pain  NEXT MD VISIT: Unsure  OBJECTIVE:  Note: Objective measures were completed at Evaluation unless otherwise noted.  DIAGNOSTIC FINDINGS:  N/A  PATIENT SURVEYS:  NDI:  NECK DISABILITY INDEX  Date: 11/18/2024 Score  Pain intensity 2 = The pain is moderate at the moment  2. Personal care (washing, dressing, etc.) 0 = I can look after myself normally without causing extra pain  3. Lifting 0 =  I can lift heavy weights without extra pain  4. Reading 0 = I can read as much as I want to with no pain in my neck  5. Headaches 0 = I have no headaches at all  6.  Concentration 0 =  I can concentrate fully when I want to with no difficulty  7. Work 0 =  I can do as much work as I want to  8. Driving 1 =  I can drive my car as long as I want with slight pain in my neck  9. Sleeping 1 = My sleep is slightly disturbed (less than 1 hr sleepless)  10. Recreation 1 =  I am able to engage in all my recreation activities, with some pain in my neck  Total 5/50   Minimum Detectable Change (90% confidence): 5 points or 10% points  COGNITION: Overall cognitive status: Within functional limits for tasks assessed  SENSATION: WFL  POSTURE: No Significant postural limitations  PALPATION: Pt with tightness noted in the L paraspinals, B UT's, and some periscapular tightness noted as well.   CERVICAL ROM:   Active ROM A/PROM (deg) eval  Flexion 54*  Extension 56*  Right lateral flexion 35*  Left lateral flexion 44*  Right rotation 53*  Left rotation 49*   (Blank rows = not tested)  UPPER EXTREMITY ROM:  WNL for Flexion and Abduction bilaterally   CERVICAL SPECIAL TESTS:  Sharp pursor's test: Negative   TREATMENT DATE: 12/02/24  Manual:  Supine STM to cervical region to increase extensibility of the paraspinals Supine suboccipital release technique to decrease cervicalgia Supine manual traction performed in order to increase joint space in cervical region for pain relief Supine cervical upglides/downglides, 30 sec bouts Supine UT/Levator stretch, 30 sec bouts to increase tissue extensibility of the cervical region   TherEx:  Seated UBE, LVL 8, 3 min forward/3 min retro   Standing upper rows with OMEGA, 25#, 2x10   Seated cable  column rows, 25#, 2x10 Seated lat pull downs, 25#, 2x10 Seated chest press, 25#, 2x10     PATIENT EDUCATION:  Education details: Pt educated on role of PT and services provided during current POC, along with prognosis and information about the clinic. Person educated: Patient Education method:  Explanation Education comprehension: verbalized understanding  HOME EXERCISE PROGRAM: TBD at the next visit  ASSESSMENT:  CLINICAL IMPRESSION:  Patient still reporting some increased pain in the neck, so manual therapy was utilized to reduce overall symptoms.  Patient seems to respond well to manual therapy for 24 to 48 hours however the effects tend to dwindle after that.  Patient continues to do well with the exercises given and was advised to continue with home exercise program and to stretch in the neck in order to reduce the tension in upper traps.  Patient understanding of this instruction and will continue to benefit from skilled therapy.   Note: Portions of this document were prepared using Dragon voice recognition software and although reviewed may contain unintentional dictation errors in syntax, grammar, or spelling.    OBJECTIVE IMPAIRMENTS: decreased ROM, hypomobility, and pain.   ACTIVITY LIMITATIONS: carrying, lifting, bending, and reach over head  PARTICIPATION LIMITATIONS: meal prep, laundry, community activity, occupation, and yard work  PERSONAL FACTORS: Past/current experiences, Time since onset of injury/illness/exacerbation, and 3+ comorbidities: HTN, TIA, Afib, CHF, DM2, arthritis, PE, Falls are also affecting patient's functional outcome.   REHAB POTENTIAL: Fair pt with a lack of provocation of pain within the evaluation.  CLINICAL DECISION MAKING: Evolving/moderate complexity  EVALUATION COMPLEXITY: Moderate   GOALS: Goals reviewed with patient? Yes  SHORT TERM GOALS: Target date: 12/16/2024  Pt will be independent with HEP in order to demonstrate increased ability to perform tasks related to occupation/hobbies. Baseline:  Goal status: INITIAL   LONG TERM GOALS: Target date: 01/13/2025  Pt will reduce overall pain level to 0/10 by utilizing a combination of stretching, strengthening exercises, and pain-reducing modalities in order to improve overall  QoL. Baseline:  Goal status: INITIAL  2.  Pt will demonstrate decrease in NDI by at least 5% in order to demonstrate clinically significant reduction in disability related to neck injury/pain. Baseline: 5% Goal status: INITIAL   PLAN:  PT FREQUENCY: 2x/week  PT DURATION: 8 weeks  PLANNED INTERVENTIONS: 97750- Physical Performance Testing, 97110-Therapeutic exercises, 97530- Therapeutic activity, 97112- Neuromuscular re-education, 97535- Self Care, [id number]- Manual therapy, 20560 (1-2 muscles), 20561 (3+ muscles)- Dry Needling, Patient/Family education, Joint mobilization, Joint manipulation, Spinal mobilization, and Vestibular training  PLAN FOR NEXT SESSION:   Continue with manual therapy coupled with UE strengthening program  Fonda Simpers, PT, DPT Physical Therapist - [facility]  12/02/24, 12:02 PM  "

## 2024-12-03 ENCOUNTER — Ambulatory Visit

## 2024-12-07 ENCOUNTER — Ambulatory Visit

## 2024-12-09 ENCOUNTER — Ambulatory Visit

## 2024-12-09 DIAGNOSIS — M542 Cervicalgia: Secondary | ICD-10-CM | POA: Diagnosis not present

## 2024-12-09 DIAGNOSIS — R29898 Other symptoms and signs involving the musculoskeletal system: Secondary | ICD-10-CM

## 2024-12-09 NOTE — Therapy (Signed)
 " OUTPATIENT PHYSICAL THERAPY CERVICAL TREATMENT   Patient Name: Jason Floyd MRN: [id number] DOB:1965/11/25, 59 y.o., male Today's Date: 12/09/2024  END OF SESSION:  PT End of Session - 12/09/24 1037     Visit Number 6    Number of Visits 17    Date for Recertification  01/13/25    PT Start Time 1035    PT Stop Time 1115    PT Time Calculation (min) 40 min    Activity Tolerance Patient tolerated treatment well          Past Medical History:  Diagnosis Date   AICD (automatic cardioverter/defibrillator) present    Allergy    Arthritis    Atrial fibrillation (HCC)    CHF (congestive heart failure) (HCC)    CHF (congestive heart failure) (HCC)    Diabetes mellitus without complication (HCC)    Drug abuse (HCC)    Eczema    GERD (gastroesophageal reflux disease)    Gout    Heart attack (HCC) 06/10/2020   History of kidney stones    Hyperlipidemia    Hypertension    MI (myocardial infarction) (HCC)    PE (pulmonary embolism)    TIA (transient ischemic attack)    hx of per pt - pt not aware he had not followed by a neurologist   Ventricular tachycardia (HCC)    Past Surgical History:  Procedure Laterality Date   ATRIAL FIBRILLATION ABLATION N/A 10/04/2023   Procedure: ATRIAL FIBRILLATION ABLATION;  Surgeon: Kennyth Chew, MD;  Location: [facility];  Service: Cardiovascular;  Laterality: N/A;   CARDIOVERSION N/A 07/18/2023   Procedure: CARDIOVERSION;  Surgeon: Cherrie Toribio SAUNDERS, MD;  Location: [facility];  Service: Cardiovascular;  Laterality: N/A;   CERVICAL FUSION     ICD IMPLANT N/A 08/22/2020   Procedure: ICD IMPLANT;  Surgeon: Fernande Elspeth BROCKS, MD;  Location: [facility] INVAS[facility] CV [facility];  Service: Cardiovascular;  Laterality: N/A;   KNEE ARTHROSCOPY Right 10/28/2020   Procedure: RIGHT KNEE ARTHROSCOPY, PARTIAL MEDIAL MENISCECTOMY, CHONDROPLASTY;  Surgeon: Sheril Coy, MD;  Location: [facility];  Service: Orthopedics;  Laterality: Right;   left  thumb surgery      RIGHT/LEFT HEART CATH AND CORONARY ANGIOGRAPHY N/A 07/01/2020   Procedure: RIGHT/LEFT HEART CATH AND CORONARY ANGIOGRAPHY;  Surgeon: Cherrie Toribio SAUNDERS, MD;  Location: [facility];  Service: Cardiovascular;  Laterality: N/A;   SHOULDER ARTHROSCOPY Left    SHOULDER ARTHROSCOPY WITH ROTATOR CUFF REPAIR AND SUBACROMIAL DECOMPRESSION Right 06/14/2022   Procedure: SHOULDER ARTHROSCOPY WITH ROTATOR CUFF REPAIR, SUBACROMIAL DECOMPRESSION AND TENOTOMY;  Surgeon: Dozier Soulier, MD;  Location: [facility];  Service: Orthopedics;  Laterality: Right;   TEE WITHOUT CARDIOVERSION N/A 07/18/2023   Procedure: TRANSESOPHAGEAL ECHOCARDIOGRAM;  Surgeon: Cherrie Toribio SAUNDERS, MD;  Location: [facility] INVASIVE CV LAB;  Service: Cardiovascular;  Laterality: N/A;   TOTAL KNEE ARTHROPLASTY Right 10/03/2021   Procedure: RIGHT TOTAL KNEE ARTHROPLASTY;  Surgeon: Sheril Coy, MD;  Location: [facility];  Service: Orthopedics;  Laterality: Right;   VASECTOMY Bilateral    WISDOM TOOTH EXTRACTION     Patient Active Problem List   Diagnosis Date Noted   Fall 08/31/2024   Head injury due to trauma 08/31/2024   Type 2 diabetes mellitus with other diabetic kidney complication, without long-term current use of insulin  (HCC) 12/18/2023   Pulmonary nodules 12/12/2023   Hypercoagulable state due to persistent atrial fibrillation (HCC) 11/01/2023   Abnormal TSH 08/09/2023   Acute on chronic combined systolic and  diastolic CHF (congestive heart failure) (HCC) 08/09/2023   Hypothyroid 07/25/2022   Primary osteoarthritis of right knee 10/03/2021   Primary localized osteoarthritis of right knee 10/03/2021   Ischemic cardiomyopathy 03/09/2021   Persistent atrial fibrillation (HCC) 03/09/2021   ICD (implantable cardioverter-defibrillator) in place - BS 12/05/2020   Coronary artery disease involving native coronary artery of native heart without angina pectoris 09/21/2020   Chronic combined systolic and diastolic  congestive heart failure (HCC) 09/21/2020   V-tach (HCC) 09/21/2020   Nasal septal perforation 08/10/2020   TIA (transient ischemic attack) 06/17/2020   GERD (gastroesophageal reflux disease) 08/05/2019   HTN (hypertension) 04/16/2018   Insomnia 01/22/2018   Controlled diabetes mellitus type 2 with complications (HCC) 07/07/2015   Thoracic back pain 04/19/2014   Chronic neck pain with history of cervical spinal surgery 08/21/2012   Routine general medical examination at a health care facility 04/17/2012   Hyperlipidemia 04/17/2012   OBESITY 12/07/2009   FASCIITIS, PLANTAR 03/29/2009   PULMONARY EMBOLISM, HX OF 11/18/2008   NEPHROLITHIASIS, HX OF 11/18/2008   DRUG ABUSE, HX OF 11/18/2008   GOUT 03/22/2008   NUMMULAR ECZEMA 03/22/2008    PCP: Jason Comer BRAVO, MD   REFERRING PROVIDER: Jerrell Cleatus Ned, MD   REFERRING DIAG: M54.2,G89.28,Z98.890 (ICD-10-CM) - Chronic neck pain with history of cervical spinal surgery   THERAPY DIAG:  Cervical pain  Neck tightness  Rationale for Evaluation and Treatment: Rehabilitation  ONSET DATE: 08/12/24  SUBJECTIVE:                                                                                                                                                                                                         SUBJECTIVE STATEMENT:  Pt reports having 3-4/10 pain upon arrival.  Pt notes that yesterday was the best day he has had in a while, noting a significant reduction in overall pain.     Hand dominance: Right  PERTINENT HISTORY:   Per MD: Jason Floyd is a 59 year old male with a history of cervical spine surgery who presents with persistent neck pain.   He has been experiencing persistent neck pain for two months. The pain is constant, radiates down the left side of the neck, and sometimes extends to the shoulder and over the ear. Movement exacerbates the pain, while holding the neck still provides relief. The  patient reports no pain radiating down the arm and no weakness or clumsiness in the hands.   His medical history includes a ruptured cervical disc approximately ten years ago, treated with discectomy and  fusion. Last year, he had similar symptoms that resolved by the time an MRI was conducted, which showed inflammation but required no immediate intervention. Recently, he completed a course of prednisone  prescribed by a nurse practitioner, which did not alleviate the symptoms.  PAIN:  Are you having pain? Yes: NPRS scale: 5/10 Pain location: anterior left portion of the platysma, L sided cervical paraspinals Pain description: pulled muscle? Aggravating factors: Turning to the L hurts the worst Relieving factors: Heat a little, massage a little bit as well.  PRECAUTIONS: Other: pt with prior discectomy and fusion 10 years ago.  RED FLAGS: None     WEIGHT BEARING RESTRICTIONS: No  FALLS:  Has patient fallen in last 6 months? Yes. Number of falls 1  LIVING ENVIRONMENT: Lives with: lives with their spouse Lives in: House/apartment Stairs: Yes: External: 3 steps; on left going up Has following equipment at home: Single point cane and Walker - 2 wheeled  OCCUPATION: Owns business  PLOF: Independent  PATIENT GOALS: To reduce overall pain  NEXT MD VISIT: Unsure  OBJECTIVE:  Note: Objective measures were completed at Evaluation unless otherwise noted.  DIAGNOSTIC FINDINGS:  N/A  PATIENT SURVEYS:  NDI:  NECK DISABILITY INDEX  Date: 11/18/2024 Score  Pain intensity 2 = The pain is moderate at the moment  2. Personal care (washing, dressing, etc.) 0 = I can look after myself normally without causing extra pain  3. Lifting 0 =  I can lift heavy weights without extra pain  4. Reading 0 = I can read as much as I want to with no pain in my neck  5. Headaches 0 = I have no headaches at all  6. Concentration 0 =  I can concentrate fully when I want to with no difficulty  7. Work 0 =  I  can do as much work as I want to  8. Driving 1 =  I can drive my car as long as I want with slight pain in my neck  9. Sleeping 1 = My sleep is slightly disturbed (less than 1 hr sleepless)  10. Recreation 1 =  I am able to engage in all my recreation activities, with some pain in my neck  Total 5/50   Minimum Detectable Change (90% confidence): 5 points or 10% points  COGNITION: Overall cognitive status: Within functional limits for tasks assessed  SENSATION: WFL  POSTURE: No Significant postural limitations  PALPATION: Pt with tightness noted in the L paraspinals, B UT's, and some periscapular tightness noted as well.   CERVICAL ROM:   Active ROM A/PROM (deg) eval  Flexion 54*  Extension 56*  Right lateral flexion 35*  Left lateral flexion 44*  Right rotation 53*  Left rotation 49*   (Blank rows = not tested)  UPPER EXTREMITY ROM:  WNL for Flexion and Abduction bilaterally   CERVICAL SPECIAL TESTS:  Sharp pursor's test: Negative   TREATMENT DATE: 12/09/24   Manual:  Supine STM to cervical region to increase extensibility of the paraspinals Supine suboccipital release technique to decrease cervicalgia Supine manual traction performed in order to increase joint space in cervical region for pain relief Supine cervical upglides/downglides, 30 sec bouts Supine UT/Levator stretch, 30 sec bouts to increase tissue extensibility of the cervical region   TherEx:   Seated cable column rows, 25#, 2x10 Seated lat pull downs, 25#, 2x10 Seated chest press, 25#, 2x10     PATIENT EDUCATION:  Education details: Pt educated on role of PT and services  provided during current POC, along with prognosis and information about the clinic. Person educated: Patient Education method: Explanation Education comprehension: verbalized understanding  HOME EXERCISE PROGRAM: TBD at the next visit  ASSESSMENT:  CLINICAL IMPRESSION:  Pt continues to respond well to the  exercises and noted to have a reduction in overall pain levels upon completion.  Pt still having taut bands in the L side of the cervical paraspinals, however pt notes them to not be painful at this time.  Pt encouraged to continue with stretching at home in order to assist in improving tissue extensibility of the cervical and shoulder areas.   Pt will continue to benefit from skilled therapy to address remaining deficits in order to improve overall QoL and return to PLOF.        OBJECTIVE IMPAIRMENTS: decreased ROM, hypomobility, and pain.   ACTIVITY LIMITATIONS: carrying, lifting, bending, and reach over head  PARTICIPATION LIMITATIONS: meal prep, laundry, community activity, occupation, and yard work  PERSONAL FACTORS: Past/current experiences, Time since onset of injury/illness/exacerbation, and 3+ comorbidities: HTN, TIA, Afib, CHF, DM2, arthritis, PE, Falls are also affecting patient's functional outcome.   REHAB POTENTIAL: Fair pt with a lack of provocation of pain within the evaluation.  CLINICAL DECISION MAKING: Evolving/moderate complexity  EVALUATION COMPLEXITY: Moderate   GOALS: Goals reviewed with patient? Yes  SHORT TERM GOALS: Target date: 12/16/2024  Pt will be independent with HEP in order to demonstrate increased ability to perform tasks related to occupation/hobbies. Baseline:  Goal status: INITIAL   LONG TERM GOALS: Target date: 01/13/2025  Pt will reduce overall pain level to 0/10 by utilizing a combination of stretching, strengthening exercises, and pain-reducing modalities in order to improve overall QoL. Baseline: 5/10 Goal status: INITIAL  2.  Pt will demonstrate decrease in NDI by at least 5% in order to demonstrate clinically significant reduction in disability related to neck injury/pain. Baseline: 5% Goal status: INITIAL   PLAN:  PT FREQUENCY: 2x/week  PT DURATION: 8 weeks  PLANNED INTERVENTIONS: 97750- Physical Performance Testing,  97110-Therapeutic exercises, 97530- Therapeutic activity, 97112- Neuromuscular re-education, 97535- Self Care, 02859- Manual therapy, 20560 (1-2 muscles), 20561 (3+ muscles)- Dry Needling, Patient/Family education, Joint mobilization, Joint manipulation, Spinal mobilization, and Vestibular training  PLAN FOR NEXT SESSION:   Continue with manual therapy coupled with UE strengthening program  Fonda Simpers, PT, DPT Physical Therapist - [facility]  12/09/24, 10:37 AM  "

## 2024-12-11 NOTE — Progress Notes (Unsigned)
 "  Referring Physician:  Mahlon Comer BRAVO, MD [address] [place],  [place] [address]  Primary Physician:  Mahlon Comer BRAVO, MD  History of Present Illness: 12/11/2024 [name] is here today with a chief complaint of ***   Chronic neck pain with history of cervical spinal surgery. Pain radiates down the left side of the neck, and sometimes extends to the shoulder. Any numbness, tingling, or weakness.   Duration: *** Location: *** Quality: *** Severity: ***  Precipitating: aggravated by *** Modifying factors: made better by *** Weakness: none Timing: *** Bowel/Bladder Dysfunction: none  Conservative measures:  Physical therapy: *** Is participating in @[facility]. Multimodal medical therapy including regular antiinflammatories: *** prednisone  Injections: *** epidural steroid injections?  Past Surgery: *** C6-7 ACDF (When)  Jason Floyd has ***no symptoms of cervical myelopathy.  The symptoms are causing a significant impact on the patient's life.   Review of Systems:  A 10 point review of systems is negative, except for the pertinent positives and negatives detailed in the HPI.  Past Medical History: Past Medical History:  Diagnosis Date   AICD (automatic cardioverter/defibrillator) present    Allergy    Arthritis    Atrial fibrillation (HCC)    CHF (congestive heart failure) (HCC)    CHF (congestive heart failure) (HCC)    Diabetes mellitus without complication (HCC)    Drug abuse (HCC)    Eczema    GERD (gastroesophageal reflux disease)    Gout    Heart attack (HCC) 06/10/2020   History of kidney stones    Hyperlipidemia    Hypertension    MI (myocardial infarction) (HCC)    PE (pulmonary embolism)    TIA (transient ischemic attack)    hx of per pt - pt not aware he had not followed by a neurologist   Ventricular tachycardia (HCC)     Past Surgical History: Past Surgical History:  Procedure Laterality Date   ATRIAL  FIBRILLATION ABLATION N/A 10/04/2023   Procedure: ATRIAL FIBRILLATION ABLATION;  Surgeon: Kennyth Chew, MD;  Location: [facility];  Service: Cardiovascular;  Laterality: N/A;   CARDIOVERSION N/A 07/18/2023   Procedure: CARDIOVERSION;  Surgeon: Cherrie Toribio SAUNDERS, MD;  Location: [facility];  Service: Cardiovascular;  Laterality: N/A;   CERVICAL FUSION     ICD IMPLANT N/A 08/22/2020   Procedure: ICD IMPLANT;  Surgeon: Fernande Elspeth BROCKS, MD;  Location: [facility] INVASIVE CV LAB;  Service: Cardiovascular;  Laterality: N/A;   KNEE ARTHROSCOPY Right 10/28/2020   Procedure: RIGHT KNEE ARTHROSCOPY, PARTIAL MEDIAL MENISCECTOMY, CHONDROPLASTY;  Surgeon: Sheril Coy, MD;  Location: [facility];  Service: Orthopedics;  Laterality: Right;   left thumb surgery      RIGHT/LEFT HEART CATH AND CORONARY ANGIOGRAPHY N/A 07/01/2020   Procedure: RIGHT/LEFT HEART CATH AND CORONARY ANGIOGRAPHY;  Surgeon: Cherrie Toribio SAUNDERS, MD;  Location: [facility];  Service: Cardiovascular;  Laterality: N/A;   SHOULDER ARTHROSCOPY Left    SHOULDER ARTHROSCOPY WITH ROTATOR CUFF REPAIR AND SUBACROMIAL DECOMPRESSION Right 06/14/2022   Procedure: SHOULDER ARTHROSCOPY WITH ROTATOR CUFF REPAIR, SUBACROMIAL DECOMPRESSION AND TENOTOMY;  Surgeon: Dozier Soulier, MD;  Location: [facility];  Service: Orthopedics;  Laterality: Right;   TEE WITHOUT CARDIOVERSION N/A 07/18/2023   Procedure: TRANSESOPHAGEAL ECHOCARDIOGRAM;  Surgeon: Cherrie Toribio SAUNDERS, MD;  Location: [facility] INVASIVE CV LAB;  Service: Cardiovascular;  Laterality: N/A;   TOTAL KNEE ARTHROPLASTY Right 10/03/2021   Procedure: RIGHT TOTAL KNEE ARTHROPLASTY;  Surgeon: Sheril Coy,  MD;  Location: [facility];  Service: Orthopedics;  Laterality: Right;   VASECTOMY Bilateral    WISDOM TOOTH EXTRACTION      Allergies: Allergies as of 12/15/2024   (No Known Allergies)    Medications: Outpatient Encounter Medications as of 12/15/2024  Medication Sig   allopurinol  (ZYLOPRIM ) 300  MG tablet TAKE 2 TABLETS (600 MG TOTAL) BY MOUTH DAILY.   amiodarone  (PACERONE ) 200 MG tablet Take 1 tablet (200 mg total) by mouth daily.   aspirin  EC 81 MG tablet Take 81 mg by mouth daily. Swallow whole.   atorvastatin  (LIPITOR) 80 MG tablet TAKE 1 TABLET BY MOUTH DAILY.   calcium  carbonate (TUMS - DOSED IN MG ELEMENTAL CALCIUM ) 500 MG chewable tablet Chew 2-3 tablets by mouth daily as needed for indigestion or heartburn.   carvedilol  (COREG ) 3.125 MG tablet TAKE 1 TABLET BY MOUTH 2 TIMES DAILY.   cetirizine (ZYRTEC) 10 MG tablet Take 10 mg by mouth daily.   cyclobenzaprine  (FLEXERIL ) 10 MG tablet Take 1 tablet (10 mg total) by mouth 3 (three) times daily as needed for muscle spasms.   ELIQUIS  5 MG TABS tablet TAKE 1 TABLET BY MOUTH 2 TIMES A DAY   FARXIGA  10 MG TABS tablet TAKE 1 TABLET BY MOUTH DAILY BEFORE BREAKFAST.   fenofibrate  160 MG tablet TAKE 1 TABLET BY MOUTH DAILY.   furosemide  (LASIX ) 20 MG tablet Take 1 tablet by mouth daily as needed for swelling or a weight gain of 3 pounds or more in 24 hours or 5 pounds in one week.   magnesium  oxide (MAG-OX) 400 MG tablet Take 1 tab by mouth Twice daily   methocarbamol  (ROBAXIN ) 500 MG tablet Take 1 tablet (500 mg total) by mouth every 6 (six) hours as needed for muscle spasms.   mexiletine (MEXITIL ) 150 MG capsule TAKE 2 CAPSULES BY MOUTH 2 TIMES DAILY.   Naphazoline HCl (CLEAR EYES OP) Place 1 drop into both eyes daily as needed (sore eyes).   pantoprazole  (PROTONIX ) 40 MG tablet TAKE 1 TABLET BY MOUTH DAILY.   potassium chloride  SA (KLOR-CON  M20) 20 MEQ tablet Take 1 tablet by mouth when taking Lasix    predniSONE  (STERAPRED UNI-PAK 21 TAB) 10 MG (21) TBPK tablet Take as directed (Patient not taking: Reported on 10/28/2024)   REPATHA  SURECLICK 140 MG/ML SOAJ INJECT 140 MG INTO THE SKIN EVERY 14 DAYS.   sacubitril -valsartan  (ENTRESTO ) 49-51 MG TAKE 1 TABLET BY MOUTH 2 TIMES DAILY.   Semaglutide , 2 MG/DOSE, (OZEMPIC , 2 MG/DOSE,) 8 MG/3ML  SOPN INJECT 2 MG INTO THE SKIN ONCE A WEEK.   sodium chloride  (OCEAN) 0.65 % SOLN nasal spray Place 1 spray into both nostrils as needed (dryness).   spironolactone  (ALDACTONE ) 25 MG tablet TAKE 1 TABLET (25 MG TOTAL) BY MOUTH AT BEDTIME.   traZODone  (DESYREL ) 100 MG tablet TAKE 1 TABLET (100 MG TOTAL) BY MOUTH AT BEDTIME.   triamcinolone  (KENALOG ) 0.025 % ointment Apply 1 Application topically daily as needed (Dry skin).   VASCEPA  1 g capsule TAKE 2 CAPSULES BY MOUTH 2 TIMES DAILY.   [DISCONTINUED] Semaglutide ,0.25 or 0.5MG /DOS, (OZEMPIC , 0.25 OR 0.5 MG/DOSE,) 2 MG/1.5ML SOPN Inject 0.5 mg into the skin once a week.   No facility-administered encounter medications on file as of 12/15/2024.    Social History: Social History[1]  Family Medical History: Family History  Problem Relation Age of Onset   Dementia Mother        frontal-temporal   Stroke Mother    Breast cancer Maternal  Aunt    Other Brother        overdose   Colon cancer Neg Hx     Physical Examination: @VITALWITHPAIN @  General: Patient is well developed, well nourished, calm, collected, and in no apparent distress. Attention to examination is appropriate.  Psychiatric: Patient is non-anxious.  Head:  Pupils equal, round, and reactive to light.  ENT:  Oral mucosa appears well hydrated.  Neck:   Supple.  ***Full range of motion.  Respiratory: Patient is breathing without any difficulty.  Extremities: No edema.  Vascular: Palpable dorsal pedal pulses.  Skin:   On exposed skin, there are no abnormal skin lesions.  NEUROLOGICAL:     Awake, alert, oriented to person, place, and time.  Speech is clear and fluent. Fund of knowledge is appropriate.   Cranial Nerves: Pupils equal round and reactive to light.  Facial tone is symmetric.  Facial sensation is symmetric.  ROM of spine: ***full.  Palpation of spine: ***non tender.    Strength: Side Biceps Triceps Deltoid Interossei Grip Wrist Ext. Wrist Flex.  R 5 5 5  5 5 5 5   L 5 5 5 5 5 5 5    Side Iliopsoas Quads Hamstring PF DF EHL  R 5 5 5 5 5 5   L 5 5 5 5 5 5    Reflexes are ***2+ and symmetric at the biceps, triceps, brachioradialis, patella and achilles.   Hoffman's is absent.  Clonus is not present.  Toes are down-going.  Bilateral upper and lower extremity sensation is intact to light touch.    Gait is normal.   No difficulty with tandem gait.   No evidence of dysmetria noted.  Medical Decision Making  Imaging: ***  I have personally reviewed the images and agree with the above interpretation.  Assessment and Plan: Mr. Schulenburg is a pleasant 59 y.o. male with ***    Thank you for involving me in the care of this patient.   I spent a total of *** minutes in both face-to-face and non-face-to-face activities for this visit on the date of this encounter.   Lyle Decamp, PA-C Dept. of Neurosurgery     [1]  Social History Tobacco Use   Smoking status: Former    Current packs/day: 0.00    Average packs/day: 0.3 packs/day for 31.0 years (7.8 ttl pk-yrs)    Types: Cigarettes    Quit date: 07/15/2023    Years since quitting: 1.4   Smokeless tobacco: Never   Tobacco comments:    Former smoker 11/01/23  Vaping Use   Vaping status: Never Used  Substance Use Topics   Alcohol use: Yes    Comment: once a month   Drug use: Not Currently    Types: Marijuana    Comment: gummies every days   "

## 2024-12-14 ENCOUNTER — Ambulatory Visit

## 2024-12-15 ENCOUNTER — Other Ambulatory Visit: Payer: Self-pay | Admitting: Family Medicine

## 2024-12-15 ENCOUNTER — Ambulatory Visit: Admitting: Physician Assistant

## 2024-12-15 ENCOUNTER — Other Ambulatory Visit (HOSPITAL_COMMUNITY): Payer: Self-pay | Admitting: Internal Medicine

## 2024-12-16 ENCOUNTER — Ambulatory Visit

## 2024-12-16 DIAGNOSIS — R29898 Other symptoms and signs involving the musculoskeletal system: Secondary | ICD-10-CM

## 2024-12-16 DIAGNOSIS — M542 Cervicalgia: Secondary | ICD-10-CM

## 2024-12-16 NOTE — Therapy (Signed)
 " OUTPATIENT PHYSICAL THERAPY CERVICAL TREATMENT   Patient Name: Jason Floyd MRN: [id number] DOB:Mar 23, 1966, 59 y.o., male Today's Date: 12/16/2024  END OF SESSION:  PT End of Session - 12/16/24 1114     Visit Number 7    Number of Visits 17    Date for Recertification  01/13/25    PT Start Time 1116    PT Stop Time 1159    PT Time Calculation (min) 43 min           Past Medical History:  Diagnosis Date   AICD (automatic cardioverter/defibrillator) present    Allergy    Arthritis    Atrial fibrillation (HCC)    CHF (congestive heart failure) (HCC)    CHF (congestive heart failure) (HCC)    Diabetes mellitus without complication (HCC)    Drug abuse (HCC)    Eczema    GERD (gastroesophageal reflux disease)    Gout    Heart attack (HCC) 06/10/2020   History of kidney stones    Hyperlipidemia    Hypertension    MI (myocardial infarction) (HCC)    PE (pulmonary embolism)    TIA (transient ischemic attack)    hx of per pt - pt not aware he had not followed by a neurologist   Ventricular tachycardia (HCC)    Past Surgical History:  Procedure Laterality Date   ATRIAL FIBRILLATION ABLATION N/A 10/04/2023   Procedure: ATRIAL FIBRILLATION ABLATION;  Surgeon: Kennyth Chew, MD;  Location: [facility];  Service: Cardiovascular;  Laterality: N/A;   CARDIOVERSION N/A 07/18/2023   Procedure: CARDIOVERSION;  Surgeon: Cherrie Toribio SAUNDERS, MD;  Location: [facility];  Service: Cardiovascular;  Laterality: N/A;   CERVICAL FUSION     ICD IMPLANT N/A 08/22/2020   Procedure: ICD IMPLANT;  Surgeon: Fernande Elspeth BROCKS, MD;  Location[facility];  Service: Cardiovascular;  Laterality: N/A;   KNEE ARTHROSCOPY Right 10/28/2020   Procedure: RIGHT KNEE ARTHROSCOPY, PARTIAL MEDIAL MENISCECTOMY, CHONDROPLASTY;  Surgeon: Sheril Coy, MD;  Location: [facility];  Service: Orthopedics;  Laterality: Right;   left thumb surgery      RIGHT/LEFT HEART CATH AND CORONARY  ANGIOGRAPHY N/A 07/01/2020   Procedure: RIGHT/LEFT HEART CATH AND CORONARY ANGIOGRAPHY;  Surgeon: Cherrie Toribio SAUNDERS, MD;  Location: [facility];  Service: Cardiovascular;  Laterality: N/A;   SHOULDER ARTHROSCOPY Left    SHOULDER ARTHROSCOPY WITH ROTATOR CUFF REPAIR AND SUBACROMIAL DECOMPRESSION Right 06/14/2022   Procedure: SHOULDER ARTHROSCOPY WITH ROTATOR CUFF REPAIR, SUBACROMIAL DECOMPRESSION AND TENOTOMY;  Surgeon: Dozier Soulier, MD;  Location: [facility];  Service: Orthopedics;  Laterality: Right;   TEE WITHOUT CARDIOVERSION N/A 07/18/2023   Procedure: TRANSESOPHAGEAL ECHOCARDIOGRAM;  Surgeon: Cherrie Toribio SAUNDERS, MD;  Location[facility] INVASIVE CV LAB;  Service: Cardiovascular;  Laterality: N/A;   TOTAL KNEE ARTHROPLASTY Right 10/03/2021   Procedure: RIGHT TOTAL KNEE ARTHROPLASTY;  Surgeon: Sheril Coy, MD;  Location: [facility];  Service: Orthopedics;  Laterality: Right;   VASECTOMY Bilateral    WISDOM TOOTH EXTRACTION     Patient Active Problem List   Diagnosis Date Noted   Fall 08/31/2024   Head injury due to trauma 08/31/2024   Type 2 diabetes mellitus with other diabetic kidney complication, without long-term current use of insulin  (HCC) 12/18/2023   Pulmonary nodules 12/12/2023   Hypercoagulable state due to persistent atrial fibrillation (HCC) 11/01/2023   Abnormal TSH 08/09/2023   Acute on chronic combined systolic and diastolic CHF (congestive heart failure) (HCC) 08/09/2023  Hypothyroid 07/25/2022   Primary osteoarthritis of right knee 10/03/2021   Primary localized osteoarthritis of right knee 10/03/2021   Ischemic cardiomyopathy 03/09/2021   Persistent atrial fibrillation (HCC) 03/09/2021   ICD (implantable cardioverter-defibrillator) in place - BS 12/05/2020   Coronary artery disease involving native coronary artery of native heart without angina pectoris 09/21/2020   Chronic combined systolic and diastolic congestive heart failure (HCC) 09/21/2020   V-tach (HCC)  09/21/2020   Nasal septal perforation 08/10/2020   TIA (transient ischemic attack) 06/17/2020   GERD (gastroesophageal reflux disease) 08/05/2019   HTN (hypertension) 04/16/2018   Insomnia 01/22/2018   Controlled diabetes mellitus type 2 with complications (HCC) 07/07/2015   Thoracic back pain 04/19/2014   Chronic neck pain with history of cervical spinal surgery 08/21/2012   Routine general medical examination at a health care facility 04/17/2012   Hyperlipidemia 04/17/2012   OBESITY 12/07/2009   FASCIITIS, PLANTAR 03/29/2009   PULMONARY EMBOLISM, HX OF 11/18/2008   NEPHROLITHIASIS, HX OF 11/18/2008   DRUG ABUSE, HX OF 11/18/2008   GOUT 03/22/2008   NUMMULAR ECZEMA 03/22/2008    PCP: Mahlon Comer BRAVO, MD   REFERRING PROVIDER: Jerrell Cleatus Ned, MD   REFERRING DIAG: M54.2,G89.28,Z98.890 (ICD-10-CM) - Chronic neck pain with history of cervical spinal surgery   THERAPY DIAG:  Cervical pain  Neck tightness  Rationale for Evaluation and Treatment: Rehabilitation  ONSET DATE: 08/12/24  SUBJECTIVE:                                                                                                                                                                                                         SUBJECTIVE STATEMENT:  Pt reports having 4/10 pain upon arrival.  Pt notes that he doesn't notice any changes with pain as of late.      Hand dominance: Right  PERTINENT HISTORY:   Per MD: Jason Floyd is a 59 year old male with a history of cervical spine surgery who presents with persistent neck pain.   He has been experiencing persistent neck pain for two months. The pain is constant, radiates down the left side of the neck, and sometimes extends to the shoulder and over the ear. Movement exacerbates the pain, while holding the neck still provides relief. The patient reports no pain radiating down the arm and no weakness or clumsiness in the hands.   His  medical history includes a ruptured cervical disc approximately ten years ago, treated with discectomy and fusion. Last year, he had similar symptoms that resolved by the time an MRI was conducted,  which showed inflammation but required no immediate intervention. Recently, he completed a course of prednisone  prescribed by a nurse practitioner, which did not alleviate the symptoms.  PAIN:  Are you having pain? Yes: NPRS scale: 5/10 Pain location: anterior left portion of the platysma, L sided cervical paraspinals Pain description: pulled muscle? Aggravating factors: Turning to the L hurts the worst Relieving factors: Heat a little, massage a little bit as well.  PRECAUTIONS: Other: pt with prior discectomy and fusion 10 years ago.  RED FLAGS: None     WEIGHT BEARING RESTRICTIONS: No  FALLS:  Has patient fallen in last 6 months? Yes. Number of falls 1  LIVING ENVIRONMENT: Lives with: lives with their spouse Lives in: House/apartment Stairs: Yes: External: 3 steps; on left going up Has following equipment at home: Single point cane and Walker - 2 wheeled  OCCUPATION: Owns business  PLOF: Independent  PATIENT GOALS: To reduce overall pain  NEXT MD VISIT: Unsure  OBJECTIVE:  Note: Objective measures were completed at Evaluation unless otherwise noted.  DIAGNOSTIC FINDINGS:  N/A  PATIENT SURVEYS:  NDI:  NECK DISABILITY INDEX  Date: 11/18/2024 Score  Pain intensity 2 = The pain is moderate at the moment  2. Personal care (washing, dressing, etc.) 0 = I can look after myself normally without causing extra pain  3. Lifting 0 =  I can lift heavy weights without extra pain  4. Reading 0 = I can read as much as I want to with no pain in my neck  5. Headaches 0 = I have no headaches at all  6. Concentration 0 =  I can concentrate fully when I want to with no difficulty  7. Work 0 =  I can do as much work as I want to  8. Driving 1 =  I can drive my car as long as I want with  slight pain in my neck  9. Sleeping 1 = My sleep is slightly disturbed (less than 1 hr sleepless)  10. Recreation 1 =  I am able to engage in all my recreation activities, with some pain in my neck  Total 5/50   Minimum Detectable Change (90% confidence): 5 points or 10% points  COGNITION: Overall cognitive status: Within functional limits for tasks assessed  SENSATION: WFL  POSTURE: No Significant postural limitations  PALPATION: Pt with tightness noted in the L paraspinals, B UT's, and some periscapular tightness noted as well.   CERVICAL ROM:   Active ROM A/PROM (deg) eval  Flexion 54*  Extension 56*  Right lateral flexion 35*  Left lateral flexion 44*  Right rotation 53*  Left rotation 49*   (Blank rows = not tested)  UPPER EXTREMITY ROM:  WNL for Flexion and Abduction bilaterally   CERVICAL SPECIAL TESTS:  Sharp pursor's test: Negative   TREATMENT DATE: 12/16/24   Manual:  Supine STM to cervical region to increase extensibility of the paraspinals Supine suboccipital release technique to decrease cervicalgia Supine manual traction performed in order to increase joint space in cervical region for pain relief Supine cervical upglides/downglides, 30 sec bouts Supine UT/Levator stretch, 30 sec bouts to increase tissue extensibility of the cervical region   TherEx:   Seated levator scap, stretch 3 x 30 sec Seated UT stretch, 3 x 30 sec Seated lat pull downs, 25#, 2x12 Seated UBE, LVL 3, 4 min forward/4 min retro  Standing upper rows with OMEGA, 25#, 2x12 Standing wall angels with 3 lb weights on wrists, with focus on  cervical retraction while performing shoulder mobility, 2x10  Standing serratus rolls with blue foam roller, 2x12    PATIENT EDUCATION:  Education details: Pt educated on role of PT and services provided during current POC, along with prognosis and information about the clinic. Person educated: Patient Education method:  Explanation Education comprehension: verbalized understanding  HOME EXERCISE PROGRAM: TBD at the next visit  ASSESSMENT:  CLINICAL IMPRESSION:  Pt unable to complete standing wall angels for desired frequency with weights due to weakness. Pt still having taut bands in the L side of the cervical paraspinals, however pt notes them to not be painful at this time.  Pt encouraged to continue with stretching at home in order to assist in improving tissue extensibility of the cervical and shoulder areas. Pt will continue to benefit from skilled therapy to address remaining deficits in order to improve overall QoL and return to PLOF.     OBJECTIVE IMPAIRMENTS: decreased ROM, hypomobility, and pain.   ACTIVITY LIMITATIONS: carrying, lifting, bending, and reach over head  PARTICIPATION LIMITATIONS: meal prep, laundry, community activity, occupation, and yard work  PERSONAL FACTORS: Past/current experiences, Time since onset of injury/illness/exacerbation, and 3+ comorbidities: HTN, TIA, Afib, CHF, DM2, arthritis, PE, Falls are also affecting patient's functional outcome.   REHAB POTENTIAL: Fair pt with a lack of provocation of pain within the evaluation.  CLINICAL DECISION MAKING: Evolving/moderate complexity  EVALUATION COMPLEXITY: Moderate   GOALS: Goals reviewed with patient? Yes  SHORT TERM GOALS: Target date: 12/16/2024  Pt will be independent with HEP in order to demonstrate increased ability to perform tasks related to occupation/hobbies. Baseline:  Goal status: INITIAL   LONG TERM GOALS: Target date: 01/13/2025  Pt will reduce overall pain level to 0/10 by utilizing a combination of stretching, strengthening exercises, and pain-reducing modalities in order to improve overall QoL. Baseline: 5/10 Goal status: INITIAL  2.  Pt will demonstrate decrease in NDI by at least 5% in order to demonstrate clinically significant reduction in disability related to neck injury/pain. Baseline:  5% Goal status: INITIAL   PLAN:  PT FREQUENCY: 2x/week  PT DURATION: 8 weeks  PLANNED INTERVENTIONS: 97750- Physical Performance Testing, 97110-Therapeutic exercises, 97530- Therapeutic activity, 97112- Neuromuscular re-education, 97535- Self Care, [id number]- Manual therapy, 20560 (1-2 muscles), 20561 (3+ muscles)- Dry Needling, Patient/Family education, Joint mobilization, Joint manipulation, Spinal mobilization, and Vestibular training  PLAN FOR NEXT SESSION:   Reassess HEP. Continue with UE strengthening program and add stabilization exercises  Laymon GORMAN Perfect, PT, DT Physical Therapist - [facility]   12/16/24, 12:03 PM  "

## 2024-12-21 ENCOUNTER — Ambulatory Visit

## 2024-12-23 ENCOUNTER — Ambulatory Visit

## 2024-12-28 ENCOUNTER — Ambulatory Visit

## 2024-12-29 ENCOUNTER — Ambulatory Visit

## 2024-12-29 ENCOUNTER — Ambulatory Visit: Admitting: Physician Assistant

## 2024-12-30 ENCOUNTER — Ambulatory Visit

## 2024-12-31 ENCOUNTER — Ambulatory Visit

## 2025-01-05 ENCOUNTER — Ambulatory Visit: Admitting: Physical Therapy

## 2025-01-06 ENCOUNTER — Ambulatory Visit: Admitting: Emergency Medicine

## 2025-01-07 ENCOUNTER — Ambulatory Visit

## 2025-01-11 ENCOUNTER — Ambulatory Visit

## 2025-01-13 ENCOUNTER — Ambulatory Visit

## 2025-01-14 ENCOUNTER — Ambulatory Visit

## 2025-01-18 ENCOUNTER — Ambulatory Visit

## 2025-01-20 ENCOUNTER — Ambulatory Visit: Admitting: Physician Assistant

## 2025-01-21 ENCOUNTER — Ambulatory Visit

## 2025-02-18 ENCOUNTER — Ambulatory Visit

## 2025-05-20 ENCOUNTER — Ambulatory Visit

## 2025-08-19 ENCOUNTER — Ambulatory Visit

## 2025-11-18 ENCOUNTER — Ambulatory Visit
